# Patient Record
Sex: Male | Born: 1945 | Race: Black or African American | Hispanic: No | Marital: Single | Smoking: Former smoker
Health system: Southern US, Community
[De-identification: ages and names within clinical notes are randomized; demographics above are authoritative.]

## PROBLEM LIST (undated history)

## (undated) DIAGNOSIS — C61 Malignant neoplasm of prostate: Secondary | ICD-10-CM

## (undated) DIAGNOSIS — T7840XA Allergy, unspecified, initial encounter: Secondary | ICD-10-CM

## (undated) DIAGNOSIS — M1990 Unspecified osteoarthritis, unspecified site: Secondary | ICD-10-CM

## (undated) HISTORY — DX: Allergy, unspecified, initial encounter: T78.40XA

## (undated) HISTORY — PX: PROSTATE SURGERY: SHX751

## (undated) HISTORY — PX: EYE SURGERY: SHX253

## (undated) HISTORY — DX: Unspecified osteoarthritis, unspecified site: M19.90

## (undated) HISTORY — PX: COLONOSCOPY: SHX174

---

## 2013-04-04 ENCOUNTER — Encounter (HOSPITAL_COMMUNITY): Payer: Self-pay

## 2013-04-04 ENCOUNTER — Emergency Department (HOSPITAL_COMMUNITY)
Admission: EM | Admit: 2013-04-04 | Discharge: 2013-04-04 | Disposition: A | Discharge status: Home / Self Care | Payer: Medicare Other | Attending: Emergency Medicine | Admitting: Emergency Medicine

## 2013-04-04 ENCOUNTER — Emergency Department (HOSPITAL_COMMUNITY): Payer: Medicare Other

## 2013-04-04 DIAGNOSIS — R059 Cough, unspecified: Secondary | ICD-10-CM | POA: Insufficient documentation

## 2013-04-04 DIAGNOSIS — R05 Cough: Secondary | ICD-10-CM | POA: Insufficient documentation

## 2013-04-04 DIAGNOSIS — J189 Pneumonia, unspecified organism: Secondary | ICD-10-CM | POA: Insufficient documentation

## 2013-04-04 DIAGNOSIS — Z87891 Personal history of nicotine dependence: Secondary | ICD-10-CM | POA: Insufficient documentation

## 2013-04-04 DIAGNOSIS — H109 Unspecified conjunctivitis: Secondary | ICD-10-CM | POA: Insufficient documentation

## 2013-04-04 HISTORY — DX: Malignant neoplasm of prostate: C61

## 2013-04-04 MED ORDER — SULFACETAMIDE SODIUM 10 % OP SOLN
2.0000 [drp] | OPHTHALMIC | Status: DC
  Filled 2013-04-04: qty 15

## 2013-04-04 MED ORDER — AZITHROMYCIN 250 MG PO TABS: ORAL_TABLET | ORAL | Status: DC

## 2013-04-04 NOTE — ED Provider Notes (Signed)
  CSN: [id number]     Arrival date & time 04/04/13  2952 History     First MD Initiated Contact with Patient 04/04/13 [phone]     Chief Complaint  Patient presents with  . Eye Drainage  . Cough   (Consider location/radiation/quality/duration/timing/severity/associated sxs/prior Treatment) Patient is a 68 y.o. male presenting with cough.  Cough  Pt reports cough for the last 4-5 days productive of green sputum, not associated with SOB or CP. No fever. Yesterday he noticed some discomfort and redness in his L eye with drainage of mucous today. He has been taking OTC cold remedy with minimal improvement. He is otherwise healthy.    Past Medical History  Diagnosis Date  . Prostate cancer    Past Surgical History  Procedure Laterality Date  . Eye surgery    . Prostate surgery     History reviewed. No pertinent family history. History  Substance Use Topics  . Smoking status: Former Games developer  . Smokeless tobacco: Never Used  . Alcohol Use: No    Review of Systems  Respiratory: Positive for cough.    All other systems reviewed and are negative except as noted in HPI.   Allergies  Review of patient's allergies indicates not on file.  Home Medications  No current outpatient prescriptions on file. BP 119/76  Pulse 67  Temp(Src) 98.1 F (36.7 C) (Oral)  Resp 20  Ht 6\' 2"  (1.88 m)  Wt 233 lb 4 oz (105.802 kg)  BMI 29.93 kg/m2  SpO2 97% Physical Exam  Nursing note and vitals reviewed. Constitutional: He is oriented to person, place, and time. He appears well-developed and well-nourished.  HENT:  Head: Normocephalic and atraumatic.  Eyes: EOM are normal. Pupils are equal, round, and reactive to light. Right eye exhibits no discharge. Left eye exhibits discharge.  Moderate conjunctival injection L eye, mild proptosis bilaterally is normal per patient. No periorbital swelling  Neck: Normal range of motion. Neck supple.  Cardiovascular: Normal rate, normal heart sounds and intact  distal pulses.   Pulmonary/Chest: Effort normal and breath sounds normal.  Abdominal: Bowel sounds are normal. He exhibits no distension. There is no tenderness.  Musculoskeletal: Normal range of motion. He exhibits no edema and no tenderness.  Neurological: He is alert and oriented to person, place, and time. He has normal strength. No cranial nerve deficit or sensory deficit.  Skin: Skin is warm and dry. No rash noted.  Psychiatric: He has a normal mood and affect.    ED Course   Procedures (including critical care time)  Labs Reviewed - No data to display Dg Chest 2 View  04/04/2013   *RADIOLOGY REPORT*  Clinical Data: Productive cough of 304 days duration.  CHEST - 2 VIEW  Comparison: None.  Findings: Heart size is normal.  Mediastinal shadows are normal.  I think there is mild patchy density at both lung bases consistent with minimal basilar pneumonia.  No dense consolidation or major collapse.  No effusions.  No bony abnormalities.  IMPRESSION: Suspicion of mild patchy basilar pneumonia bilaterally.   Original Report Authenticated By: Paulina Fusi, M.D.   1. Community acquired pneumonia   2. Conjunctivitis     MDM  CXR images reviewed. Will give Z-pak for early PNA. Erythro drops for conjunctivitis.   Latorya Bautch B. Bernette Mayers, MD 04/04/13 [phone]

## 2013-04-04 NOTE — ED Notes (Signed)
Patient reports a productive cough with green sputum and has white eye drainage to the left eye with slight swelling x 5 days

## 2014-07-03 ENCOUNTER — Encounter (HOSPITAL_COMMUNITY): Payer: Self-pay | Admitting: Emergency Medicine

## 2014-07-03 ENCOUNTER — Emergency Department (HOSPITAL_COMMUNITY)
Admission: EM | Admit: 2014-07-03 | Discharge: 2014-07-03 | Disposition: A | Discharge status: Home / Self Care | Payer: Medicare Other | Attending: Emergency Medicine | Admitting: Emergency Medicine

## 2014-07-03 ENCOUNTER — Emergency Department (HOSPITAL_COMMUNITY): Payer: Medicare Other

## 2014-07-03 DIAGNOSIS — Z8546 Personal history of malignant neoplasm of prostate: Secondary | ICD-10-CM | POA: Diagnosis not present

## 2014-07-03 DIAGNOSIS — Z79899 Other long term (current) drug therapy: Secondary | ICD-10-CM | POA: Insufficient documentation

## 2014-07-03 DIAGNOSIS — J189 Pneumonia, unspecified organism: Secondary | ICD-10-CM

## 2014-07-03 DIAGNOSIS — Z87891 Personal history of nicotine dependence: Secondary | ICD-10-CM | POA: Diagnosis not present

## 2014-07-03 DIAGNOSIS — J159 Unspecified bacterial pneumonia: Secondary | ICD-10-CM | POA: Insufficient documentation

## 2014-07-03 DIAGNOSIS — R05 Cough: Secondary | ICD-10-CM | POA: Diagnosis not present

## 2014-07-03 DIAGNOSIS — R059 Cough, unspecified: Secondary | ICD-10-CM

## 2014-07-03 MED ORDER — AZITHROMYCIN 250 MG PO TABS: ORAL_TABLET | ORAL | Status: DC

## 2014-07-03 NOTE — Discharge Instructions (Signed)
Take zpack as prescribed.   Stay hydrated.   You have scarring in your right lung. Get repeat xray in a month to make sure it resolves. If it doesn't you may need further evaluation.   Return to ER if you have fever, worse cough, vomiting, trouble breathing

## 2014-07-03 NOTE — ED Notes (Signed)
Pt ambulatory upon DC. He reports that he is leaving with ALL belongings he arrived with.

## 2014-07-03 NOTE — ED Notes (Signed)
Pt states he has had productive cough x 2 days.  States he feels much better and wasn't going to come but decided "he better get checked out".

## 2014-07-03 NOTE — ED Notes (Signed)
MD Yao at bedside 

## 2014-07-03 NOTE — ED Provider Notes (Signed)
CSN: [id number]     Arrival date & time 07/03/14  5643 History   First MD Initiated Contact with Patient 07/03/14 [phone]     Chief Complaint  Patient presents with  . Cough     (Consider location/radiation/quality/duration/timing/severity/associated sxs/prior Treatment) The history is provided by the patient.  Benjamin [name] is a 68 y.o. male hx of prostate cancer in remission here with cough. Has been having productive cough with yellow sputum for the last 2 days. Denies any fevers or chills. Denies any sick contacts. Denies any abdominal pain or vomiting. Patient was a former smoker and quit years ago. Patient had similar presentation a year ago and was diagnosed with pneumonia.    Past Medical History  Diagnosis Date  . Prostate cancer    Past Surgical History  Procedure Laterality Date  . Eye surgery    . Prostate surgery     No family history on file. History  Substance Use Topics  . Smoking status: Former Research scientist (life sciences)  . Smokeless tobacco: Never Used  . Alcohol Use: No    Review of Systems  Respiratory: Positive for cough.   All other systems reviewed and are negative.     Allergies  Review of patient's allergies indicates no known allergies.  Home Medications   Prior to Admission medications   Medication Sig Start Date End Date Taking? Authorizing Provider  b complex vitamins tablet Take 1 tablet by mouth daily.   Yes Historical Provider, MD  Chlorophyll 100 MG TABS Take 1,600 mg by mouth every Monday, Wednesday, and Friday. Patient takes eight 200 mg tablets a day.   Yes Historical Provider, MD  cholecalciferol (VITAMIN D) 1000 UNITS tablet Take 1,000 Units by mouth daily.   Yes Historical Provider, MD  Clove POWD Take 1 scoop by mouth every Monday, Wednesday, and Friday. Puts in tea   Yes Historical Provider, MD  ferrous sulfate 325 (65 FE) MG tablet Take 325 mg by mouth daily with breakfast.   Yes Historical Provider, MD  fish oil-omega-3 fatty acids 1000 MG  capsule Take 1 g by mouth daily.   Yes Historical Provider, MD  Flaxseed, Linseed, (FLAX SEEDS) POWD Take 1 scoop by mouth every Monday, Wednesday, and Friday. Mixes with green tea   Yes Historical Provider, MD  Ginger Root POWD Take 1 scoop by mouth every Monday, Wednesday, and Friday. Mixes in tea   Yes Historical Provider, MD  influenza vac recombinant HA trivalent (FLUBLOK) injection Inject 0.5 mLs into the muscle once.   Yes Historical Provider, MD  magnesium oxide (MAG-OX) 400 MG tablet Take 400 mg by mouth daily.   Yes Historical Provider, MD  Turmeric, Lear Ng, (TURMERIC ROOT) POWD Take 1 scoop by mouth every Monday, Wednesday, and Friday.   Yes Historical Provider, MD  vitamin C (ASCORBIC ACID) 500 MG tablet Take 500 mg by mouth daily.   Yes Historical Provider, MD  Zinc 40 MG TABS Take 40 mg by mouth See admin instructions. Every 4 days   Yes Historical Provider, MD  azithromycin (ZITHROMAX) 250 MG tablet Use as directed 04/04/13   Charles B. Karle Starch, MD   BP 112/60 mmHg  Pulse 82  Temp(Src) 97.8 F (36.6 C) (Oral)  Resp 16  SpO2 100% Physical Exam  Constitutional: He is oriented to person, place, and time. He appears well-developed and well-nourished.  NAD, comfortable   HENT:  Head: Normocephalic.  Mouth/Throat: Oropharynx is clear and moist.  Eyes: Conjunctivae and EOM are normal. Pupils are equal,  round, and reactive to light.  Neck: Normal range of motion. Neck supple.  Cardiovascular: Normal rate, regular rhythm and normal heart sounds.   Pulmonary/Chest: Effort normal and breath sounds normal. No respiratory distress. He has no wheezes. He has no rales.  Abdominal: Soft. Bowel sounds are normal. He exhibits no distension. There is no tenderness. There is no rebound and no guarding.  Musculoskeletal: Normal range of motion. He exhibits no edema or tenderness.  Neurological: He is alert and oriented to person, place, and time. No cranial nerve deficit. Coordination  normal.  Skin: Skin is warm and dry.  Psychiatric: He has a normal mood and affect. His behavior is normal. Judgment and thought content normal.  Nursing note and vitals reviewed.   ED Course  Procedures (including critical care time) Labs Review Labs Reviewed - No data to display  Imaging Review Dg Chest 2 View  07/03/2014   CLINICAL DATA:  Productive cough for 2 days. History of prostate cancer.  EXAM: CHEST  2 VIEW  COMPARISON:  04/04/2013  FINDINGS: Low lung volumes and bibasilar linear opacities not significantly changed from the 04/04/2013 exam. Cardiac and mediastinal margins appear normal. Prominence of stool in the splenic and hepatic flexures.  IMPRESSION: 1. Volume loss and bandlike opacities in the lung bases are very similar to the prior exam from last year, and raise suspicion for scarring given the similar configuration. Recurrent atelectasis or aspiration pneumonia considered possible but less likely. 2. Prominence of stool in the visualized colon may reflect constipation. 3. Low lung volumes.   Electronically Signed   By: Sherryl Barters M.D.   On: 07/03/2014 08:55     EKG Interpretation None      MDM   Final diagnoses:  Cough   Benjamin [name] is a 68 y.o. male here with cough. Likely viral syndrome vs early pneumonia. Appears well, O2 100% on RA. Doesn't appear septic. Will get CXR.   9:18 AM CXR showed recurrent opacity. Consider atelectasis vs pneumonia. Appears well. I think likely atypical organisms. Will give zpack. Will request repeat cxr in a month. He was previous smoker so I am concerned for possible mass underlying the area. If its from pneumonia, the repeat cxr should be more clear. If not, may need further workup.   Wandra Arthurs, MD 07/03/14 [phone]

## 2015-03-20 ENCOUNTER — Other Ambulatory Visit (HOSPITAL_COMMUNITY): Payer: Self-pay

## 2015-03-20 ENCOUNTER — Ambulatory Visit (HOSPITAL_COMMUNITY)
Admission: RE | Admit: 2015-03-20 | Discharge: 2015-03-20 | Disposition: A | Discharge status: Home / Self Care | Payer: Medicare Other | Source: Ambulatory Visit | Attending: Internal Medicine | Admitting: Internal Medicine

## 2015-03-20 ENCOUNTER — Other Ambulatory Visit (HOSPITAL_COMMUNITY): Payer: Self-pay | Admitting: Internal Medicine

## 2015-03-20 DIAGNOSIS — R918 Other nonspecific abnormal finding of lung field: Secondary | ICD-10-CM | POA: Diagnosis present

## 2015-03-20 DIAGNOSIS — J9811 Atelectasis: Secondary | ICD-10-CM

## 2015-03-20 DIAGNOSIS — J984 Other disorders of lung: Secondary | ICD-10-CM | POA: Diagnosis not present

## 2015-06-12 ENCOUNTER — Encounter (HOSPITAL_COMMUNITY): Payer: Self-pay | Admitting: Emergency Medicine

## 2015-06-12 ENCOUNTER — Emergency Department (HOSPITAL_COMMUNITY)
Admission: EM | Admit: 2015-06-12 | Discharge: 2015-06-12 | Disposition: A | Discharge status: Home / Self Care | Payer: Medicare Other | Attending: Emergency Medicine | Admitting: Emergency Medicine

## 2015-06-12 DIAGNOSIS — Z79899 Other long term (current) drug therapy: Secondary | ICD-10-CM | POA: Diagnosis not present

## 2015-06-12 DIAGNOSIS — J029 Acute pharyngitis, unspecified: Secondary | ICD-10-CM | POA: Diagnosis not present

## 2015-06-12 DIAGNOSIS — Z8546 Personal history of malignant neoplasm of prostate: Secondary | ICD-10-CM | POA: Insufficient documentation

## 2015-06-12 DIAGNOSIS — Z87891 Personal history of nicotine dependence: Secondary | ICD-10-CM | POA: Insufficient documentation

## 2015-06-12 MED ORDER — LIDOCAINE VISCOUS 2 % MT SOLN
15.0000 mL | Freq: Once | OROMUCOSAL | Status: AC
  Administered 2015-06-12: 15 mL via OROMUCOSAL
  Filled 2015-06-12: qty 15

## 2015-06-12 MED ORDER — DEXAMETHASONE 4 MG PO TABS
16.0000 mg | ORAL_TABLET | Freq: Once | ORAL | Status: AC
  Administered 2015-06-12: 16 mg via ORAL
  Filled 2015-06-12: qty 4

## 2015-06-12 MED ORDER — LIDOCAINE VISCOUS 2 % MT SOLN: 15.0000 mL | Freq: Three times a day (TID) | OROMUCOSAL | Status: DC

## 2015-06-12 MED ORDER — PENICILLIN G BENZATHINE 1200000 UNIT/2ML IM SUSP
1.2000 10*6.[IU] | Freq: Once | INTRAMUSCULAR | Status: AC
  Administered 2015-06-12: 1.2 10*6.[IU] via INTRAMUSCULAR
  Filled 2015-06-12: qty 2

## 2015-06-12 NOTE — ED Notes (Signed)
MD at bedside. 

## 2015-06-12 NOTE — ED Notes (Signed)
Per pt, states sore throat since yesterday

## 2015-06-12 NOTE — ED Provider Notes (Signed)
CSN: [id number]     Arrival date & time 06/12/15  0720 History   First MD Initiated Contact with Patient 06/12/15 0725     Chief Complaint  Patient presents with  . Sore Throat     (Consider location/radiation/quality/duration/timing/severity/associated sxs/prior Treatment) Patient is a 69 y.o. male presenting with pharyngitis.  Sore Throat This is a new problem. The current episode started 2 days ago. The problem occurs constantly. The problem has been gradually worsening. Pertinent negatives include no chest pain, no abdominal pain, no headaches and no shortness of breath. Nothing aggravates the symptoms. Nothing relieves the symptoms. He has tried nothing for the symptoms.    Past Medical History  Diagnosis Date  . Prostate cancer [facility])    Past Surgical History  Procedure Laterality Date  . Eye surgery    . Prostate surgery     No family history on file. Social History  Substance Use Topics  . Smoking status: Former Research scientist (life sciences)  . Smokeless tobacco: Never Used  . Alcohol Use: No    Review of Systems  Constitutional: Positive for chills. Negative for fever and fatigue.  HENT: Negative for congestion, drooling, rhinorrhea, sneezing, trouble swallowing and voice change.   Respiratory: Negative for cough and shortness of breath.   Cardiovascular: Negative for chest pain.  Gastrointestinal: Negative for nausea, vomiting and abdominal pain.  Endocrine: Negative for polydipsia and polyuria.  Genitourinary: Negative for dysuria.  Neurological: Negative for headaches.      Allergies  Review of patient's allergies indicates no known allergies.  Home Medications   Prior to Admission medications   Medication Sig Start Date End Date Taking? Authorizing Provider  azithromycin (ZITHROMAX Z-PAK) 250 MG tablet 2 po day one, then 1 daily x 4 days 07/03/14   Wandra Arthurs, MD  b complex vitamins tablet Take 1 tablet by mouth daily.    Historical Provider, MD  Chlorophyll 100 MG TABS  Take 1,600 mg by mouth every Monday, Wednesday, and Friday. Patient takes eight 200 mg tablets a day.    Historical Provider, MD  cholecalciferol (VITAMIN D) 1000 UNITS tablet Take 1,000 Units by mouth daily.    Historical Provider, MD  Oscar La POWD Take 1 scoop by mouth every Monday, Wednesday, and Friday. Puts in tea    Historical Provider, MD  ferrous sulfate 325 (65 FE) MG tablet Take 325 mg by mouth daily with breakfast.    Historical Provider, MD  fish oil-omega-3 fatty acids 1000 MG capsule Take 1 g by mouth daily.    Historical Provider, MD  Flaxseed, Linseed, (FLAX SEEDS) POWD Take 1 scoop by mouth every Monday, Wednesday, and Friday. Mixes with green tea    Historical Provider, MD  Ginger Root POWD Take 1 scoop by mouth every Monday, Wednesday, and Friday. Mixes in tea    Historical Provider, MD  influenza vac recombinant HA trivalent (FLUBLOK) injection Inject 0.5 mLs into the muscle once.    Historical Provider, MD  magnesium oxide (MAG-OX) 400 MG tablet Take 400 mg by mouth daily.    Historical Provider, MD  Turmeric, Lear Ng, (TURMERIC ROOT) POWD Take 1 scoop by mouth every Monday, Wednesday, and Friday.    Historical Provider, MD  vitamin C (ASCORBIC ACID) 500 MG tablet Take 500 mg by mouth daily.    Historical Provider, MD  Zinc 40 MG TABS Take 40 mg by mouth See admin instructions. Every 4 days    Historical Provider, MD   BP 142/70 mmHg  Pulse 76  Temp(Src) 98.7 F (37.1 C)  Resp 18  SpO2 100% Physical Exam  Constitutional: He is oriented to person, place, and time. He appears well-developed and well-nourished.  HENT:  Head: Normocephalic and atraumatic.  Mouth/Throat: Oropharyngeal exudate, posterior oropharyngeal edema and posterior oropharyngeal erythema present. No tonsillar abscesses.  Eyes: Conjunctivae and EOM are normal.  Neck: Normal range of motion. Neck supple.  Cardiovascular: Normal rate and regular rhythm.   Pulmonary/Chest: Effort normal. No respiratory  distress.  Abdominal: Soft. There is no tenderness.  Musculoskeletal: Normal range of motion. He exhibits no edema or tenderness.  Neurological: He is alert and oriented to person, place, and time.  Skin: Skin is warm and dry.  Nursing note and vitals reviewed.   ED Course  Procedures (including critical care time) Labs Review Labs Reviewed - No data to display  Imaging Review No results found. I have personally reviewed and evaluated these images and lab results as part of my medical decision-making.   EKG Interpretation None      MDM   Final diagnoses:  Pharyngitis   Patient with pharyngitis without evidence of abscess or other compilations. Centor score of 3 I'll elect to treat prophylactically for strep. Patient without any voice changes, trouble swallowing or breathing however will return if he starts having the symptoms.  I have personally and contemperaneously reviewed labs and imaging and used in my decision making as above.   A medical screening exam was performed and I feel the patient has had an appropriate workup for their chief complaint at this time and likelihood of emergent condition existing is low. They have been counseled on decision, discharge, follow up and which symptoms necessitate immediate return to the emergency department. They or their family verbally stated understanding and agreement with plan and discharged in stable condition.      Merrily Pew, MD 06/12/15 [phone]

## 2015-11-11 ENCOUNTER — Encounter: Payer: Self-pay | Admitting: Gastroenterology

## 2015-12-17 ENCOUNTER — Ambulatory Visit (AMBULATORY_SURGERY_CENTER): Payer: Self-pay

## 2015-12-17 VITALS — Ht 72.0 in | Wt 258.6 lb

## 2015-12-17 DIAGNOSIS — Z1211 Encounter for screening for malignant neoplasm of colon: Secondary | ICD-10-CM

## 2015-12-17 MED ORDER — SUPREP BOWEL PREP KIT 17.5-3.13-1.6 GM/177ML PO SOLN
1.0000 | Freq: Once | ORAL | Status: DC
Start: 2015-12-17 — End: 2015-12-31

## 2015-12-17 NOTE — Progress Notes (Signed)
No allergies to eggs or soy No past problems with anesthesia No home oxygen No diet meds  Has email and internet; refused emmi 

## 2015-12-31 ENCOUNTER — Ambulatory Visit (AMBULATORY_SURGERY_CENTER): Payer: Medicare Other | Admitting: Gastroenterology

## 2015-12-31 ENCOUNTER — Encounter: Payer: Self-pay | Admitting: Gastroenterology

## 2015-12-31 VITALS — BP 137/87 | HR 61 | Temp 98.0°F | Resp 12 | Ht 72.0 in | Wt 258.0 lb

## 2015-12-31 DIAGNOSIS — Z1211 Encounter for screening for malignant neoplasm of colon: Secondary | ICD-10-CM | POA: Diagnosis not present

## 2015-12-31 DIAGNOSIS — D123 Benign neoplasm of transverse colon: Secondary | ICD-10-CM | POA: Diagnosis not present

## 2015-12-31 DIAGNOSIS — D120 Benign neoplasm of cecum: Secondary | ICD-10-CM | POA: Diagnosis not present

## 2015-12-31 MED ORDER — SODIUM CHLORIDE 0.9 % IV SOLN: 500.0000 mL | INTRAVENOUS | Status: DC

## 2015-12-31 NOTE — Patient Instructions (Signed)

## 2015-12-31 NOTE — Progress Notes (Signed)
Called to room to assist during endoscopic procedure.  Patient ID and intended procedure confirmed with present staff. Received instructions for my participation in the procedure from the performing physician.  

## 2015-12-31 NOTE — Op Note (Signed)
[facility] Patient Name: Benjamin [name] Procedure Date: 12/31/2015 10:52 AM MRN: [id number] Endoscopist: Seama. Loletha Carrow , MD Age: 70 Date of Birth: [birth year] Gender: Male Procedure:                Colonoscopy Indications:              Screening for colorectal malignant neoplasm Medicines:                Monitored Anesthesia Care Procedure:                Pre-Anesthesia Assessment:                           - Prior to the procedure, a History and Physical                            was performed, and patient medications and                            allergies were reviewed. The patient's tolerance of                            previous anesthesia was also reviewed. The risks                            and benefits of the procedure and the sedation                            options and risks were discussed with the patient.                            All questions were answered, and informed consent                            was obtained. Prior Anticoagulants: The patient has                            taken no previous anticoagulant or antiplatelet                            agents. ASA Grade Assessment: II - A patient with                            mild systemic disease. After reviewing the risks                            and benefits, the patient was deemed in                            satisfactory condition to undergo the procedure.                           After obtaining informed consent, the colonoscope  was passed under direct vision. Throughout the                            procedure, the patient's blood pressure, pulse, and                            oxygen saturations were monitored continuously. The                            Model CF-HQ190L [id number]) scope was introduced                            through the anus and advanced to the the cecum,                            identified by appendiceal orifice and ileocecal                      valve. The colonoscopy was performed without                            difficulty. The patient tolerated the procedure                            well. The quality of the bowel preparation was                            excellent. The ileocecal valve, appendiceal                            orifice, and rectum were photographed. Scope In: 11:13:43 AM Scope Out: 11:32:08 AM Scope Withdrawal Time: 0 hours 12 minutes 55 seconds  Total Procedure Duration: 0 hours 18 minutes 25 seconds  Findings:                 The perianal and digital rectal examinations were                            normal.                           Four sessile polyps were found in the proximal                            transverse colon, distal transverse colon, cecum                            and ileocecal valve. The polyps were 4 to 6 mm in                            size. These polyps were removed with a cold snare.                            Resection and retrieval were complete.  Internal hemorrhoids were found during                            retroflexion. The hemorrhoids were medium-sized and                            Grade I (internal hemorrhoids that do not prolapse).                           The exam was otherwise without abnormality. Complications:            No immediate complications. Estimated Blood Loss:     Estimated blood loss: none. Impression:               - Four 4 to 6 mm polyps in the proximal transverse                            colon, in the distal transverse colon, in the cecum                            and at the ileocecal valve, removed with a cold                            snare. Resected and retrieved.                           - Internal hemorrhoids.                           - The examination was otherwise normal. Recommendation:           - Patient has a contact number available for                            emergencies. The signs and  symptoms of potential                            delayed complications were discussed with the                            patient. Return to normal activities tomorrow.                            Written discharge instructions were provided to the                            patient.                           - Resume previous diet.                           - No aspirin, ibuprofen, naproxen, or other                            non-steroidal anti-inflammatory drugs for 5 days  after polyp removal.                           - Await pathology results.                           - Repeat colonoscopy is recommended for                            surveillance. The colonoscopy date will be                            determined after pathology results from today's                            exam become available for review. Pavan Bring L. Loletha Carrow, MD 12/31/2015 11:39:54 AM This report has been signed electronically.

## 2015-12-31 NOTE — Progress Notes (Signed)
Report to PACU, RN, vss, BBS= Clear.  

## 2016-01-01 ENCOUNTER — Telehealth: Payer: Self-pay

## 2016-01-01 NOTE — Telephone Encounter (Signed)
Attempt follow up call post procedure. No answer or vm to leave message.

## 2016-01-05 ENCOUNTER — Encounter: Payer: Self-pay | Admitting: Gastroenterology

## 2016-06-23 ENCOUNTER — Emergency Department (HOSPITAL_COMMUNITY)
Admission: EM | Admit: 2016-06-23 | Discharge: 2016-06-23 | Disposition: A | Discharge status: Home / Self Care | Payer: Medicare Other | Attending: Emergency Medicine | Admitting: Emergency Medicine

## 2016-06-23 ENCOUNTER — Encounter (HOSPITAL_COMMUNITY): Payer: Self-pay | Admitting: Emergency Medicine

## 2016-06-23 DIAGNOSIS — R0981 Nasal congestion: Secondary | ICD-10-CM | POA: Insufficient documentation

## 2016-06-23 DIAGNOSIS — Z8546 Personal history of malignant neoplasm of prostate: Secondary | ICD-10-CM | POA: Diagnosis not present

## 2016-06-23 DIAGNOSIS — Z87891 Personal history of nicotine dependence: Secondary | ICD-10-CM | POA: Insufficient documentation

## 2016-06-23 DIAGNOSIS — Z7982 Long term (current) use of aspirin: Secondary | ICD-10-CM | POA: Insufficient documentation

## 2016-06-23 DIAGNOSIS — J3489 Other specified disorders of nose and nasal sinuses: Secondary | ICD-10-CM | POA: Diagnosis not present

## 2016-06-23 MED ORDER — CETIRIZINE HCL 10 MG PO TABS: 10.0000 mg | ORAL_TABLET | Freq: Every day | ORAL | 0 refills | Status: DC

## 2016-06-23 MED ORDER — FLUTICASONE PROPIONATE 50 MCG/ACT NA SUSP: NASAL | 1 refills | Status: DC

## 2016-06-23 MED ORDER — TAMSULOSIN HCL 0.4 MG PO CAPS: 0.4000 mg | ORAL_CAPSULE | Freq: Every day | ORAL | 0 refills | Status: DC

## 2016-06-23 NOTE — Discharge Instructions (Signed)
You have seasonal allergies.  No sign of infection requiring antibiotics.

## 2016-06-23 NOTE — ED Triage Notes (Signed)
Patient complaining of nasal congestion and non-productive cough starting Saturday. Denies pain. Patient speaking in full sentences.

## 2016-06-23 NOTE — ED Provider Notes (Signed)
[facility] DEPT Provider Note   CSN: [id number] Arrival date & time: 06/23/16  1257     History   Chief Complaint Chief Complaint  Patient presents with  . Nasal Congestion  . Cough    HPI Benjamin [name] is a 70 y.o. male.He presents stating he's had running out of the left side of his nose. Congested for the last 2 days. He states once or twice here this happens. He states that except difficult for him to sleep. No sinus pain. No pressure. No fever cough or shortness of breath.  HPI  Past Medical History:  Diagnosis Date  . Prostate cancer ([place])     There are no active problems to display for this patient.   Past Surgical History:  Procedure Laterality Date  . EYE SURGERY    . PROSTATE SURGERY         Home Medications    Prior to Admission medications   Medication Sig Start Date End Date Taking? Authorizing Provider  aspirin EC 81 MG tablet Take 81 mg by mouth daily.    Historical Provider, MD  b complex vitamins tablet Take 1 tablet by mouth daily.    Historical Provider, MD  cetirizine (ZYRTEC) 10 MG tablet Take 1 tablet (10 mg total) by mouth daily. 1 po q day prn allergies 06/23/16   Tanna Furry, MD  cetirizine (ZYRTEC) 10 MG tablet Take 1 tablet (10 mg total) by mouth daily. 1 po q day prn allergies 06/23/16   Tanna Furry, MD  cholecalciferol (VITAMIN D) 1000 UNITS tablet Take 1,000 Units by mouth daily.    Historical Provider, MD  Flaxseed, Linseed, (FLAX SEEDS) POWD Take 1 scoop by mouth every Monday, Wednesday, and Friday. Mixes with green tea    Historical Provider, MD  fluticasone (FLONASE) 50 MCG/ACT nasal spray 1 spray each nares bid 06/23/16   Tanna Furry, MD  Ginger Root POWD Take 1 scoop by mouth every Monday, Wednesday, and Friday. Mixes in tea    Historical Provider, MD  influenza vac recombinant HA trivalent (FLUBLOK) injection Inject 0.5 mLs into the muscle once. Reported on 12/31/2015    Historical Provider, MD  [place]     Historical Provider, MD  tamsulosin (FLOMAX) 0.4 MG CAPS capsule Take 1 capsule (0.4 mg total) by mouth daily. 06/23/16   Tanna Furry, MD  Turmeric, Lear Ng, (TURMERIC ROOT) POWD Take 1 scoop by mouth every Monday, Wednesday, and Friday.    Historical Provider, MD    Family History No family history on file.  Social History Social History  Substance Use Topics  . Smoking status: Former Research scientist (life sciences)  . Smokeless tobacco: Never Used  . Alcohol use No     Allergies   Review of patient's allergies indicates no known allergies.   Review of Systems Review of Systems  Constitutional: Negative for appetite change, chills, diaphoresis, fatigue and fever.  HENT: Positive for congestion and rhinorrhea. Negative for mouth sores, sore throat and trouble swallowing.   Eyes: Negative for visual disturbance.  Respiratory: Negative for cough, chest tightness, shortness of breath and wheezing.   Cardiovascular: Negative for chest pain.  Gastrointestinal: Negative for abdominal distention, abdominal pain, diarrhea, nausea and vomiting.  Endocrine: Negative for polydipsia, polyphagia and polyuria.  Genitourinary: Negative for dysuria, frequency and hematuria.  Musculoskeletal: Negative for gait problem.  Skin: Negative for color change, pallor and rash.  Neurological: Negative for dizziness, syncope, light-headedness and headaches.  Hematological: Does not bruise/bleed easily.  Psychiatric/Behavioral:  Negative for behavioral problems and confusion.     Physical Exam Updated Vital Signs BP 114/75 (BP Location: Right Arm)   Pulse (!) 58   Temp 97.6 F (36.4 C) (Oral)   Resp 16   Ht 5\' 11"  (1.803 m)   Wt 258 lb (117 kg)   SpO2 100%   BMI 35.98 kg/m   Physical Exam  Constitutional: He is oriented to person, place, and time. He appears well-developed and well-nourished. No distress.  HENT:  Head: Normocephalic.  Eyes: Conjunctivae are normal. Pupils are equal, round, and reactive to  light. No scleral icterus.  Neck: Normal range of motion. Neck supple. No thyromegaly present.  Cardiovascular: Normal rate and regular rhythm.  Exam reveals no gallop and no friction rub.   No murmur heard. Pulmonary/Chest: Effort normal and breath sounds normal. No respiratory distress. He has no wheezes. He has no rales.  Abdominal: Soft. Bowel sounds are normal. He exhibits no distension. There is no tenderness. There is no rebound.  Musculoskeletal: Normal range of motion.  Neurological: He is alert and oriented to person, place, and time.  Skin: Skin is warm and dry. No rash noted.  Psychiatric: He has a normal mood and affect. His behavior is normal.     ED Treatments / Results  Labs (all labs ordered are listed, but only abnormal results are displayed) Labs Reviewed - No data to display  EKG  EKG Interpretation None       Radiology No results found.  Procedures Procedures (including critical care time)  Medications Ordered in ED Medications - No data to display   Initial Impression / Assessment and Plan / ED Course  I have reviewed the triage vital signs and the nursing notes.  Pertinent labs & imaging results that were available during my care of the patient were reviewed by me and considered in my medical decision making (see chart for details).  Clinical Course    Nasal congestion. No sign of suppurative bacterial infection clinically.  Final Clinical Impressions(s) / ED Diagnoses   Final diagnoses:  Nasal congestion    New Prescriptions Discharge Medication List as of 06/23/2016  2:16 PM    START taking these medications   Details  cetirizine (ZYRTEC) 10 MG tablet Take 1 tablet (10 mg total) by mouth daily. 1 po q day prn allergies, Starting Wed 06/23/2016, Print    tamsulosin (FLOMAX) 0.4 MG CAPS capsule Take 1 capsule (0.4 mg total) by mouth daily., Starting Wed 06/23/2016, Print         Tanna Furry, MD 06/23/16 1459

## 2016-09-04 ENCOUNTER — Emergency Department
Admission: EM | Admit: 2016-09-04 | Discharge: 2016-09-04 | Disposition: A | Discharge status: Home / Self Care | Payer: Medicare Other | Attending: Emergency Medicine | Admitting: Emergency Medicine

## 2016-09-04 ENCOUNTER — Emergency Department: Payer: Medicare Other

## 2016-09-04 DIAGNOSIS — Z7982 Long term (current) use of aspirin: Secondary | ICD-10-CM | POA: Diagnosis not present

## 2016-09-04 DIAGNOSIS — Z87891 Personal history of nicotine dependence: Secondary | ICD-10-CM | POA: Diagnosis not present

## 2016-09-04 DIAGNOSIS — J40 Bronchitis, not specified as acute or chronic: Secondary | ICD-10-CM | POA: Insufficient documentation

## 2016-09-04 DIAGNOSIS — R05 Cough: Secondary | ICD-10-CM | POA: Diagnosis present

## 2016-09-04 DIAGNOSIS — Z8546 Personal history of malignant neoplasm of prostate: Secondary | ICD-10-CM | POA: Diagnosis not present

## 2016-09-04 DIAGNOSIS — R059 Cough, unspecified: Secondary | ICD-10-CM

## 2016-09-04 LAB — INFLUENZA PANEL BY PCR (TYPE A & B)
Influenza A By PCR: NEGATIVE
Influenza B By PCR: NEGATIVE

## 2016-09-04 MED ORDER — BENZONATATE 100 MG PO CAPS: 100.0000 mg | ORAL_CAPSULE | Freq: Four times a day (QID) | ORAL | 0 refills | Status: DC | PRN

## 2016-09-04 MED ORDER — ALBUTEROL SULFATE (2.5 MG/3ML) 0.083% IN NEBU
5.0000 mg | INHALATION_SOLUTION | Freq: Once | RESPIRATORY_TRACT | Status: AC
  Administered 2016-09-04: 5 mg via RESPIRATORY_TRACT
  Filled 2016-09-04: qty 6

## 2016-09-04 MED ORDER — PREDNISONE 20 MG PO TABS: 60.0000 mg | ORAL_TABLET | Freq: Every day | ORAL | 0 refills | Status: DC

## 2016-09-04 MED ORDER — PREDNISONE 20 MG PO TABS
60.0000 mg | ORAL_TABLET | Freq: Once | ORAL | Status: AC
  Administered 2016-09-04: 60 mg via ORAL
  Filled 2016-09-04: qty 3

## 2016-09-04 MED ORDER — BENZONATATE 100 MG PO CAPS
100.0000 mg | ORAL_CAPSULE | Freq: Once | ORAL | Status: AC
  Administered 2016-09-04: 100 mg via ORAL
  Filled 2016-09-04: qty 1

## 2016-09-04 NOTE — ED Provider Notes (Signed)
[facility] Emergency Department Provider Note   ____________________________________________   First MD Initiated Contact with Patient 09/04/16 [phone]     (approximate)  I have reviewed the triage vital signs and the nursing notes.   HISTORY  Chief Complaint Cough   HPI Benjamin [name] is a 71 y.o. male who comes into the hospital today with some cough and spitting up. He reports that he has been unable to sleep for the past 3-4 nights because she is been coughing so much. The patient reports that he is spitting up brown/green/white yellow phlegm. The patient has not had any fevers or pains in his chest. He reports that he is here in town making a [place] for someone. He reports that the symptoms started 3-4 days ago which is why he hasn't been able to sleep. He has been taking Mucinex without relief. The patient denies any shortness of breath, nausea, vomiting, chest pain. He reports that every year around this time he gets a Z-Pak for similar symptoms. He is here today for evaluation.   Past Medical History:  Diagnosis Date  . Prostate cancer ([place])     There are no active problems to display for this patient.   Past Surgical History:  Procedure Laterality Date  . EYE SURGERY    . PROSTATE SURGERY      Prior to Admission medications   Medication Sig Start Date End Date Taking? Authorizing Provider  aspirin EC 81 MG tablet Take 81 mg by mouth daily.    Historical Provider, MD  b complex vitamins tablet Take 1 tablet by mouth daily.    Historical Provider, MD  benzonatate (TESSALON PERLES) 100 MG capsule Take 1 capsule (100 mg total) by mouth every 6 (six) hours as needed for cough. 09/04/16   Loney Hering, MD  cetirizine (ZYRTEC) 10 MG tablet Take 1 tablet (10 mg total) by mouth daily. 1 po q day prn allergies 06/23/16   Tanna Furry, MD  cetirizine (ZYRTEC) 10 MG tablet Take 1 tablet (10 mg total) by mouth daily. 1 po q day prn allergies 06/23/16    Tanna Furry, MD  cholecalciferol (VITAMIN D) 1000 UNITS tablet Take 1,000 Units by mouth daily.    Historical Provider, MD  Flaxseed, Linseed, (FLAX SEEDS) POWD Take 1 scoop by mouth every Monday, Wednesday, and Friday. Mixes with green tea    Historical Provider, MD  fluticasone (FLONASE) 50 MCG/ACT nasal spray 1 spray each nares bid 06/23/16   Tanna Furry, MD  Ginger Root POWD Take 1 scoop by mouth every Monday, Wednesday, and Friday. Mixes in tea    Historical Provider, MD  influenza vac recombinant HA trivalent (FLUBLOK) injection Inject 0.5 mLs into the muscle once. Reported on 12/31/2015    Historical Provider, MD  [place]    Historical Provider, MD  predniSONE (DELTASONE) 20 MG tablet Take 3 tablets (60 mg total) by mouth daily. 09/04/16   Loney Hering, MD  tamsulosin (FLOMAX) 0.4 MG CAPS capsule Take 1 capsule (0.4 mg total) by mouth daily. 06/23/16   Tanna Furry, MD  Turmeric, Lear Ng, (TURMERIC ROOT) POWD Take 1 scoop by mouth every Monday, Wednesday, and Friday.    Historical Provider, MD    Allergies Patient has no known allergies.  No family history on file.  Social History Social History  Substance Use Topics  . Smoking status: Former Research scientist (life sciences)  . Smokeless tobacco: Never Used  . Alcohol use No  Review of Systems Constitutional: No fever/chills Eyes: No visual changes. ENT: No sore throat. Cardiovascular: Denies chest pain. Respiratory: Cough Gastrointestinal: No abdominal pain.  No nausea, no vomiting.  No diarrhea.  No constipation. Genitourinary: Negative for dysuria. Musculoskeletal: Negative for back pain. Skin: Negative for rash. Neurological: Negative for headaches, focal weakness or numbness.  10-point ROS otherwise negative.  ____________________________________________   PHYSICAL EXAM:  VITAL SIGNS: ED Triage Vitals  Enc Vitals Group     BP 09/04/16 0121 115/69     Pulse Rate 09/04/16 0121 74     Resp 09/04/16 0121  18     Temp 09/04/16 0121 98.4 F (36.9 C)     Temp Source 09/04/16 0121 Oral     SpO2 09/04/16 0121 96 %     Weight 09/04/16 0120 242 lb 5 oz (109.9 kg)     Height 09/04/16 0120 6\' 1"  (1.854 m)     Head Circumference --      Peak Flow --      Pain Score 09/04/16 0124 0     Pain Loc --      Pain Edu? --      Excl. in [place]? --     Constitutional: Alert and oriented. Well appearing and in Mild distress. Eyes: Conjunctivae are normal. PERRL. EOMI. Head: Atraumatic. Nose: No congestion/rhinnorhea. Mouth/Throat: Mucous membranes are moist.  Oropharynx non-erythematous. Cardiovascular: Normal rate, regular rhythm. Grossly normal heart sounds.  Good peripheral circulation. Respiratory: Normal respiratory effort.  No retractions. Lungs CTAB. Gastrointestinal: Soft and nontender. No distention. Positive bowel sounds Musculoskeletal: No lower extremity tenderness nor edema.   Neurologic:  Normal speech and language.  Skin:  Skin is warm, dry and intact.  Psychiatric: Mood and affect are normal.   ____________________________________________   LABS (all labs ordered are listed, but only abnormal results are displayed)  Labs Reviewed  INFLUENZA PANEL BY PCR (TYPE A & B, H1N1)   ____________________________________________  EKG  none ____________________________________________  RADIOLOGY  CXR ____________________________________________   PROCEDURES  Procedure(s) performed: None  Procedures  Critical Care performed: No  ____________________________________________   INITIAL IMPRESSION / ASSESSMENT AND PLAN / ED COURSE  Pertinent labs & imaging results that were available during my care of the patient were reviewed by me and considered in my medical decision making (see chart for details).  This is a 71 year old male who comes into the hospital today with a cough that is been productive of sputum over the last few days. The patient did receive a chest x-ray which did  not show a pneumonia. He received an albuterol treatment in triage. I will give the patient some benzonatate and some prednisone. He had a flu swab that was negative. The patient will be discharged to home with some steroids as well as some cough medicine. Otherwise patient has no further concerns.  Clinical Course as of Sep 05 647  Sat Sep 04, 2016  0429 Shallow inspiration with linear atelectasis in the lung bases. DG Chest 2 View [AW]    Clinical Course User Index [AW] Loney Hering, MD     ____________________________________________   FINAL CLINICAL IMPRESSION(S) / ED DIAGNOSES  Final diagnoses:  Cough  Bronchitis      NEW MEDICATIONS STARTED DURING THIS VISIT:  Discharge Medication List as of 09/04/2016  5:27 AM    START taking these medications   Details  benzonatate (TESSALON PERLES) 100 MG capsule Take 1 capsule (100 mg total) by mouth every 6 (six) hours as needed  for cough., Starting Sat 09/04/2016, Print    predniSONE (DELTASONE) 20 MG tablet Take 3 tablets (60 mg total) by mouth daily., Starting Sat 09/04/2016, Print         Note:  This document was prepared using Dragon voice recognition software and may include unintentional dictation errors.    Loney Hering, MD 09/04/16 [phone]

## 2016-09-04 NOTE — ED Notes (Signed)
Pt ambulatory to stat desk with EMS, EMS report cough x 3 days, productive, VSS en route, been taking flonase and mucinex w/o relief.  Pt NAD at this time.

## 2016-09-04 NOTE — ED Triage Notes (Signed)
Pt reports congestion and cough for about 3 to 4 days. Pt reports he gets these symptoms about once a year and usually a z-pack helps him.

## 2016-12-01 ENCOUNTER — Encounter (HOSPITAL_COMMUNITY): Payer: Self-pay | Admitting: *Deleted

## 2016-12-01 ENCOUNTER — Emergency Department (HOSPITAL_COMMUNITY): Payer: Medicare Other

## 2016-12-01 ENCOUNTER — Emergency Department (HOSPITAL_COMMUNITY)
Admission: EM | Admit: 2016-12-01 | Discharge: 2016-12-01 | Disposition: A | Discharge status: Home / Self Care | Payer: Medicare Other | Attending: Emergency Medicine | Admitting: Emergency Medicine

## 2016-12-01 DIAGNOSIS — J039 Acute tonsillitis, unspecified: Secondary | ICD-10-CM

## 2016-12-01 DIAGNOSIS — J029 Acute pharyngitis, unspecified: Secondary | ICD-10-CM | POA: Diagnosis present

## 2016-12-01 DIAGNOSIS — Z87891 Personal history of nicotine dependence: Secondary | ICD-10-CM | POA: Diagnosis not present

## 2016-12-01 DIAGNOSIS — Z7982 Long term (current) use of aspirin: Secondary | ICD-10-CM | POA: Diagnosis not present

## 2016-12-01 DIAGNOSIS — Z8546 Personal history of malignant neoplasm of prostate: Secondary | ICD-10-CM | POA: Diagnosis not present

## 2016-12-01 DIAGNOSIS — Z79899 Other long term (current) drug therapy: Secondary | ICD-10-CM | POA: Diagnosis not present

## 2016-12-01 DIAGNOSIS — R791 Abnormal coagulation profile: Secondary | ICD-10-CM | POA: Insufficient documentation

## 2016-12-01 LAB — CBC WITH DIFFERENTIAL/PLATELET
BASOS ABS: 0 10*3/uL (ref 0.0–0.1)
BASOS PCT: 0 %
Eosinophils Absolute: 0.1 10*3/uL (ref 0.0–0.7)
Eosinophils Relative: 1 %
HEMATOCRIT: 40.5 % (ref 39.0–52.0)
Hemoglobin: 13.3 g/dL (ref 13.0–17.0)
LYMPHS PCT: 10 %
Lymphs Abs: 1.5 10*3/uL (ref 0.7–4.0)
MCH: 29.6 pg (ref 26.0–34.0)
MCHC: 32.8 g/dL (ref 30.0–36.0)
MCV: 90 fL (ref 78.0–100.0)
MONO ABS: 1.8 10*3/uL — AB (ref 0.1–1.0)
Monocytes Relative: 12 %
NEUTROS ABS: 11.1 10*3/uL — AB (ref 1.7–7.7)
NEUTROS PCT: 77 %
Platelets: 194 10*3/uL (ref 150–400)
RBC: 4.5 MIL/uL (ref 4.22–5.81)
RDW: 13.3 % (ref 11.5–15.5)
WBC: 14.4 10*3/uL — AB (ref 4.0–10.5)

## 2016-12-01 LAB — PROTIME-INR
INR: 1.11
Prothrombin Time: 14.3 seconds (ref 11.4–15.2)

## 2016-12-01 LAB — COMPREHENSIVE METABOLIC PANEL
ALBUMIN: 3.8 g/dL (ref 3.5–5.0)
ALK PHOS: 48 U/L (ref 38–126)
ALT: 27 U/L (ref 17–63)
AST: 42 U/L — AB (ref 15–41)
Anion gap: 6 (ref 5–15)
BILIRUBIN TOTAL: 0.8 mg/dL (ref 0.3–1.2)
BUN: 18 mg/dL (ref 6–20)
CALCIUM: 8.8 mg/dL — AB (ref 8.9–10.3)
CO2: 30 mmol/L (ref 22–32)
Chloride: 101 mmol/L (ref 101–111)
Creatinine, Ser: 1.16 mg/dL (ref 0.61–1.24)
GFR calc Af Amer: >60 mL/min (ref >60)
GFR calc non Af Amer: >60 mL/min (ref >60)
GLUCOSE: 101 mg/dL — AB (ref 65–99)
POTASSIUM: 3.7 mmol/L (ref 3.5–5.1)
SODIUM: 137 mmol/L (ref 135–145)
TOTAL PROTEIN: 7.9 g/dL (ref 6.5–8.1)

## 2016-12-01 LAB — RAPID STREP SCREEN (MED CTR MEBANE ONLY): STREPTOCOCCUS, GROUP A SCREEN (DIRECT): NEGATIVE

## 2016-12-01 LAB — I-STAT CG4 LACTIC ACID, ED: Lactic Acid, Venous: 1.03 mmol/L (ref 0.5–1.9)

## 2016-12-01 MED ORDER — IOPAMIDOL (ISOVUE-300) INJECTION 61%
INTRAVENOUS | Status: AC
  Filled 2016-12-01: qty 75

## 2016-12-01 MED ORDER — CLINDAMYCIN HCL 150 MG PO CAPS: 450.0000 mg | ORAL_CAPSULE | Freq: Three times a day (TID) | ORAL | 0 refills | Status: AC

## 2016-12-01 MED ORDER — MORPHINE SULFATE (PF) 2 MG/ML IV SOLN
4.0000 mg | Freq: Once | INTRAVENOUS | Status: AC
  Administered 2016-12-01: 4 mg via INTRAVENOUS
  Filled 2016-12-01: qty 2

## 2016-12-01 MED ORDER — SODIUM CHLORIDE 0.9 % IV BOLUS (SEPSIS)
1000.0000 mL | Freq: Once | INTRAVENOUS | Status: AC
  Administered 2016-12-01: 1000 mL via INTRAVENOUS

## 2016-12-01 MED ORDER — CLINDAMYCIN HCL 300 MG PO CAPS
450.0000 mg | ORAL_CAPSULE | Freq: Once | ORAL | Status: AC
  Administered 2016-12-01: 450 mg via ORAL
  Filled 2016-12-01: qty 1

## 2016-12-01 MED ORDER — IOPAMIDOL (ISOVUE-300) INJECTION 61%
75.0000 mL | Freq: Once | INTRAVENOUS | Status: AC | PRN
  Administered 2016-12-01: 75 mL via INTRAVENOUS

## 2016-12-01 MED ORDER — HYDROCODONE-ACETAMINOPHEN 5-325 MG PO TABS: 1.0000 | ORAL_TABLET | ORAL | 0 refills | Status: DC | PRN

## 2016-12-01 MED ORDER — DEXAMETHASONE SODIUM PHOSPHATE 10 MG/ML IJ SOLN
10.0000 mg | Freq: Once | INTRAMUSCULAR | Status: AC
  Administered 2016-12-01: 10 mg via INTRAVENOUS
  Filled 2016-12-01: qty 1

## 2016-12-01 NOTE — ED Notes (Signed)
Bed: WTR5 Expected date:  Expected time:  Means of arrival:  Comments: 

## 2016-12-01 NOTE — ED Provider Notes (Signed)
[facility] DEPT Provider Note   CSN: [id number] Arrival date & time: 12/01/16  5573     History   Chief Complaint Chief Complaint  Patient presents with  . Sore Throat    HPI Benjamin [name] is a 71 y.o. male with a past medical history significant for prostate cancer who presents with a five-day history of chills and sore throat. Patient says that he is had worsening throat pain and pain with jaw opening. Patient says that he has had no cough or congestion. He says that he still able to swallow some solids but with severe pain. He says that he is tolerating his secretions and fluids. He denies any nausea, vomiting, constipation, diarrhea, dysuria. He denies any chest pain or shortness of breath. He denies any headache or injuries. Patient says it is painful to open his mouth but he is able to move his neck in all directions without significant pain. Patient has not taken his temperature at home but reports ongoing chills. He denies any sick contacts. He describes his throat pain is worse on the left than the right and is a 6 out of 10 in severity on arrival. He denies any Voice change.     The history is provided by the patient.  Sore Throat  This is a new problem. The current episode started more than 2 days ago. The problem occurs constantly. The problem has been gradually worsening. Pertinent negatives include no chest pain, no abdominal pain, no headaches and no shortness of breath. The symptoms are aggravated by eating and swallowing. Nothing relieves the symptoms. He has tried nothing for the symptoms. The treatment provided no relief.    Past Medical History:  Diagnosis Date  . Prostate cancer ([place])     There are no active problems to display for this patient.   Past Surgical History:  Procedure Laterality Date  . EYE SURGERY    . PROSTATE SURGERY         Home Medications    Prior to Admission medications   Medication Sig Start Date End Date Taking?  Authorizing Provider  aspirin EC 81 MG tablet Take 81 mg by mouth daily.    Historical Provider, MD  b complex vitamins tablet Take 1 tablet by mouth daily.    Historical Provider, MD  benzonatate (TESSALON PERLES) 100 MG capsule Take 1 capsule (100 mg total) by mouth every 6 (six) hours as needed for cough. 09/04/16   Loney Hering, MD  cetirizine (ZYRTEC) 10 MG tablet Take 1 tablet (10 mg total) by mouth daily. 1 po q day prn allergies 06/23/16   Tanna Furry, MD  cetirizine (ZYRTEC) 10 MG tablet Take 1 tablet (10 mg total) by mouth daily. 1 po q day prn allergies 06/23/16   Tanna Furry, MD  cholecalciferol (VITAMIN D) 1000 UNITS tablet Take 1,000 Units by mouth daily.    Historical Provider, MD  Flaxseed, Linseed, (FLAX SEEDS) POWD Take 1 scoop by mouth every Monday, Wednesday, and Friday. Mixes with green tea    Historical Provider, MD  fluticasone (FLONASE) 50 MCG/ACT nasal spray 1 spray each nares bid 06/23/16   Tanna Furry, MD  Ginger Root POWD Take 1 scoop by mouth every Monday, Wednesday, and Friday. Mixes in tea    Historical Provider, MD  influenza vac recombinant HA trivalent (FLUBLOK) injection Inject 0.5 mLs into the muscle once. Reported on 12/31/2015    Historical Provider, MD  [place]    Historical Provider,  MD  predniSONE (DELTASONE) 20 MG tablet Take 3 tablets (60 mg total) by mouth daily. 09/04/16   Loney Hering, MD  tamsulosin (FLOMAX) 0.4 MG CAPS capsule Take 1 capsule (0.4 mg total) by mouth daily. 06/23/16   Tanna Furry, MD  Turmeric, Lear Ng, (TURMERIC ROOT) POWD Take 1 scoop by mouth every Monday, Wednesday, and Friday.    Historical Provider, MD    Family History No family history on file.  Social History Social History  Substance Use Topics  . Smoking status: Former Research scientist (life sciences)  . Smokeless tobacco: Never Used  . Alcohol use No     Allergies   Patient has no known allergies.   Review of Systems Review of Systems  Constitutional:  Positive for chills. Negative for diaphoresis, fatigue and fever.  HENT: Positive for sore throat. Negative for congestion, rhinorrhea, sinus pain, sneezing, trouble swallowing and voice change.   Eyes: Negative for visual disturbance.  Respiratory: Negative for cough, chest tightness, shortness of breath, wheezing and stridor.   Cardiovascular: Negative for chest pain.  Gastrointestinal: Negative for abdominal pain.  Genitourinary: Negative for dysuria.  Musculoskeletal: Negative for back pain, neck pain and neck stiffness.  Skin: Negative for rash.  Neurological: Negative for speech difficulty, weakness, light-headedness and headaches.  Psychiatric/Behavioral: Negative for agitation.  All other systems reviewed and are negative.    Physical Exam Updated Vital Signs BP 121/82 (BP Location: Right Arm)   Pulse 73   Temp 98 F (36.7 C) (Oral)   Resp 18   Ht 6' (1.829 m)   Wt 230 lb (104.3 kg)   SpO2 96%   BMI 31.19 kg/m   Physical Exam  Constitutional: He is oriented to person, place, and time. He appears well-developed and well-nourished. No distress.  HENT:  Head: Atraumatic.  Right Ear: External ear normal.  Left Ear: External ear normal.  Nose: Nose normal.  Mouth/Throat: No uvula swelling. Posterior oropharyngeal edema and tonsillar abscesses (L sided peritonsilar swelling, unclear if underlying abscecss) present. Tonsils are 1+ on the right. Tonsils are 3+ on the left.    Eyes: Conjunctivae and EOM are normal. Pupils are equal, round, and reactive to light.  Neck: Normal range of motion. Neck supple. No tracheal deviation present.  Cardiovascular: Normal rate and intact distal pulses.   No murmur heard. Pulmonary/Chest: Effort normal and breath sounds normal. No stridor. No respiratory distress. He has no wheezes. He exhibits no tenderness.  Abdominal: Soft. There is no tenderness. There is no rebound and no guarding.  Musculoskeletal: He exhibits no edema.    Neurological: He is alert and oriented to person, place, and time. No sensory deficit. He exhibits normal muscle tone.  Skin: Skin is warm. Capillary refill takes less than 2 seconds. No rash noted. He is not diaphoretic. No erythema. No pallor.  Psychiatric: He has a normal mood and affect.  Nursing note and vitals reviewed.    ED Treatments / Results  Labs (all labs ordered are listed, but only abnormal results are displayed) Labs Reviewed  CBC WITH DIFFERENTIAL/PLATELET - Abnormal; Notable for the following:       Result Value   WBC 14.4 (*)    Neutro Abs 11.1 (*)    Monocytes Absolute 1.8 (*)    All other components within normal limits  COMPREHENSIVE METABOLIC PANEL - Abnormal; Notable for the following:    Glucose, Bld 101 (*)    Calcium 8.8 (*)    AST 42 (*)    All  other components within normal limits  RAPID STREP SCREEN (NOT AT [name])  CULTURE, GROUP A STREP ([place])  PROTIME-INR  I-STAT CG4 LACTIC ACID, ED  I-STAT CG4 LACTIC ACID, ED    EKG  EKG Interpretation None       Radiology Ct Soft Tissue Neck W Contrast  Result Date: 12/01/2016 CLINICAL DATA:  71 year old male with sore throat for 4 days. Unable to swallow solids. Initial encounter. EXAM: CT NECK WITH CONTRAST TECHNIQUE: Multidetector CT imaging of the neck was performed using the standard protocol following the bolus administration of intravenous contrast. CONTRAST:  40mL ISOVUE-300 IOPAMIDOL (ISOVUE-300) INJECTION 61% COMPARISON:  Chest radiographs 03/20/2015 FINDINGS: Pharynx and larynx: Negative larynx. The hypopharynx is within normal limits. Enlarged bilateral palatine tonsils. Heterogeneous enhancement of the lateral aspect of the left tonsillar pillar (series 2, image 40), but no discrete tonsillar abscess at this time. Superimposed mild oropharynx mucosal space soft tissue thickening. The adenoids and nasopharynx have a more normal appearance. Borderline to mild bilateral parapharyngeal space  stranding. Retropharyngeal space is within normal limits. Salivary glands: Negative sublingual space, submandibular glands and parotid glands. Thyroid: Negative. Lymph nodes: Increased size and number of left level 2 lymph nodes which are rounded and individually measure up to 15 mm short axis. Level IIa and IIb affected on the left side. Right level IIa and to a lesser extent 2 be increased lymph node size and number, measuring up to 13 mm. Lesser bilateral level IIIb lymph node enlargement. Level 1, level 4, and level 5 are spared. No cystic or necrotic nodes. Vascular: Major vascular structures in the neck and at the skullbase are patent. Mild soft and calcified carotid bifurcation atherosclerosis. Both internal jugular veins are patent. Limited intracranial: Negative. Visualized orbits: Postoperative changes to both globes, otherwise negative. Mastoids and visualized paranasal sinuses: Clear. Skeleton: Most of the posterior dentition is absent. No acute dental findings identified. Lower cervical disc and endplate degeneration with bulky anterior endplate spurring at N5-A2. No acute osseous abnormality identified. Upper chest: Normal visualized superior mediastinum (incidental small right tracheal diverticulum, inconsequential). Negative lung apices. IMPRESSION: 1. Bilateral palatine tonsillar enlargement and mild parapharyngeal space inflammation. Heterogeneous left tonsil might reflect developing suppuration, but there is no discrete tonsillar or neck abscess at this time. 2. Left greater than right level 2 and level 3 lymphadenopathy, favor reactive. No suppurative of or necrotic lymph nodes. Electronically Signed   By: Genevie Ann M.D.   On: 12/01/2016 12:19    Procedures Procedures (including critical care time)  Medications Ordered in ED Medications  dexamethasone (DECADRON) injection 10 mg (10 mg Intravenous Given 12/01/16 1041)  morphine 2 MG/ML injection 4 mg (4 mg Intravenous Given 12/01/16 1041)   sodium chloride 0.9 % bolus 1,000 mL (0 mLs Intravenous Stopped 12/01/16 1140)  iopamidol (ISOVUE-300) 61 % injection 75 mL (75 mLs Intravenous Contrast Given 12/01/16 1141)  clindamycin (CLEOCIN) capsule 450 mg (450 mg Oral Given 12/01/16 1346)     Initial Impression / Assessment and Plan / ED Course  I have reviewed the triage vital signs and the nursing notes.  Pertinent labs & imaging results that were available during my care of the patient were reviewed by me and considered in my medical decision making (see chart for details).     Benjamin [name] is a 71 y.o. male with a past medical history significant for prostate cancer who presents with a five-day history of chills and sore throat.    history and exam are seen above.  On exam, patient has mild trismus. Patient's mouth was opened with a tongue depressor, patient was found to have asymmetry of the oropharynx. Patient appears to have peritonsillar abscess on the left. Patient's uvula is not midline, it is shifted to the right. Patient has posterior oropharyngeal erythema. Patient has full range of motion of neck. Patient had no stridor on exam. Lungs were clear abdomen and chest were nontender.  Given patient's severe pain and swelling in his throat as well as findings on exam, patient will have workup to look for PTA or deep space neck infection. Patient given Decadron, pain medicine, and fluids. Labs obtained and CT imaging will be performed.  Diagnostic testing showed leukocytosis. Lactic acid nonelevated. CT scan showed tonsillar inflammation and swelling but no evidence of peritonsillar abscess.  Given lack of abscess in patients ability to swallow with no respiratory where we compromise, feel that patient's request for discharge home is reasonable. Patient did receive steroids in the ED. Patient given a dose of clindamycin in ED and also be given prescription for discharge. Patient fell near resolution of pain with pain  medication. Patient will be given prescription for pain medication.  Patient given extremely strict return precautions due to his tonsillitis and inflammation near the airway. Patient voiced understanding of the plan of care and the gravity of throat infections. Patient will return for any changes or worsening of symptoms. Patient will follow up with PCP and patient was discharged in good condition.   Final Clinical Impressions(s) / ED Diagnoses   Final diagnoses:  Tonsillitis    New Prescriptions Discharge Medication List as of 12/01/2016  1:29 PM    START taking these medications   Details  clindamycin (CLEOCIN) 150 MG capsule Take 3 capsules (450 mg total) by mouth 3 (three) times daily., Starting Wed 12/01/2016, Until Sat 12/11/2016, Print    HYDROcodone-acetaminophen (NORCO/VICODIN) 5-325 MG tablet Take 1 tablet by mouth every 4 (four) hours as needed., Starting Wed 12/01/2016, Print        Clinical Impression: 1. Tonsillitis     Disposition: Discharge  Condition: Good  I have discussed the results, Dx and Tx plan with the pt(& family if present). He/she/they expressed understanding and agree(s) with the plan. Discharge instructions discussed at great length. Strict return precautions discussed and pt &/or family have verbalized understanding of the instructions. No further questions at time of discharge.    Discharge Medication List as of 12/01/2016  1:29 PM    START taking these medications   Details  clindamycin (CLEOCIN) 150 MG capsule Take 3 capsules (450 mg total) by mouth 3 (three) times daily., Starting Wed 12/01/2016, Until Sat 12/11/2016, Print    HYDROcodone-acetaminophen (NORCO/VICODIN) 5-325 MG tablet Take 1 tablet by mouth every 4 (four) hours as needed., Starting Wed 12/01/2016, Print        Follow Up: [place] [address] [phone]  Schedule an appointment as soon as possible for a visit     [facility] [place] [id number] [place] [address] [phone]  If symptoms worsen     Courtney Paris, MD 12/01/16 1928

## 2016-12-01 NOTE — ED Notes (Signed)
Pt verbalized understanding of discharge instructions and denies any further questions at this time.   

## 2016-12-01 NOTE — ED Notes (Signed)
ED Provider at bedside. 

## 2016-12-01 NOTE — ED Notes (Signed)
Awaiting meds from pharmacy  

## 2016-12-01 NOTE — ED Triage Notes (Signed)
Several days of sore throat, diff swallowing, tenderness, unable to assess well due to pt not able to open wide

## 2016-12-01 NOTE — ED Notes (Signed)
Med wait

## 2016-12-01 NOTE — Discharge Instructions (Signed)
Please take your antibiotics to treat your infection. Her steroids will last for the next few days. Please use the pain medicine if needed for discomfort. If any symptoms change or worsen, please return to the nearest emergency department.

## 2016-12-03 LAB — CULTURE, GROUP A STREP (THRC)

## 2016-12-04 ENCOUNTER — Telehealth: Payer: Self-pay

## 2016-12-04 NOTE — Telephone Encounter (Signed)
Post ED Visit - Positive Culture Follow-up  Culture report reviewed by antimicrobial stewardship pharmacist:  []  Elenor Quinones, Pharm.D. []  Heide Guile, Pharm.D., BCPS AQ-ID []  Parks Neptune, Pharm.D., BCPS []  Alycia Rossetti, Pharm.D., BCPS []  [place], Pharm.D., BCPS, AAHIVP []  Legrand Como, Pharm.D., BCPS, AAHIVP []  Salome Arnt, PharmD, BCPS [x]  Dimitri Ped, PharmD, BCPS []  Vincenza Hews, PharmD, BCPS  Positive strep culture Treated with Clindamycin, organism sensitive to the same and no further patient follow-up is required at this time.  Genia Del 12/04/2016, 10:03 AM

## 2017-08-08 ENCOUNTER — Other Ambulatory Visit: Payer: Self-pay

## 2017-08-08 ENCOUNTER — Emergency Department (HOSPITAL_COMMUNITY)
Admission: EM | Admit: 2017-08-08 | Discharge: 2017-08-08 | Disposition: A | Discharge status: Home / Self Care | Payer: Medicare Other | Attending: Emergency Medicine | Admitting: Emergency Medicine

## 2017-08-08 ENCOUNTER — Encounter (HOSPITAL_COMMUNITY): Payer: Self-pay | Admitting: Emergency Medicine

## 2017-08-08 ENCOUNTER — Emergency Department (HOSPITAL_COMMUNITY): Payer: Medicare Other

## 2017-08-08 DIAGNOSIS — Z79899 Other long term (current) drug therapy: Secondary | ICD-10-CM | POA: Diagnosis not present

## 2017-08-08 DIAGNOSIS — Z7982 Long term (current) use of aspirin: Secondary | ICD-10-CM | POA: Diagnosis not present

## 2017-08-08 DIAGNOSIS — Z87891 Personal history of nicotine dependence: Secondary | ICD-10-CM | POA: Diagnosis not present

## 2017-08-08 DIAGNOSIS — J40 Bronchitis, not specified as acute or chronic: Secondary | ICD-10-CM | POA: Insufficient documentation

## 2017-08-08 DIAGNOSIS — R05 Cough: Secondary | ICD-10-CM | POA: Diagnosis present

## 2017-08-08 DIAGNOSIS — Z8546 Personal history of malignant neoplasm of prostate: Secondary | ICD-10-CM | POA: Insufficient documentation

## 2017-08-08 MED ORDER — BENZONATATE 100 MG PO CAPS: 100.0000 mg | ORAL_CAPSULE | Freq: Three times a day (TID) | ORAL | 0 refills | Status: DC

## 2017-08-08 MED ORDER — FLUTICASONE PROPIONATE 50 MCG/ACT NA SUSP: 1.0000 | Freq: Every day | NASAL | 2 refills | Status: DC

## 2017-08-08 MED ORDER — AZITHROMYCIN 250 MG PO TABS: ORAL_TABLET | ORAL | 0 refills | Status: DC

## 2017-08-08 NOTE — ED Triage Notes (Signed)
Pt complaint of ongoing productive cough ongoing for a month; unrelieved by OTC medications; "I am just trying to get rid of it."

## 2017-08-08 NOTE — ED Provider Notes (Signed)
[facility] DEPT Provider Note   CSN: [id number] Arrival date & time: 08/08/17  1051     History   Chief Complaint Chief Complaint  Patient presents with  . Cough    HPI Benjamin [name] is a 71 y.o. male who presents to the ED with a cough that has been persistent for the past 2 weeks. Patient reports that every year about this time he gets similar problem and takes OTC medications, as he has done this time, but does not get better until he get a Z-pak.   HPI  Past Medical History:  Diagnosis Date  . Prostate cancer ([place]) dx'd 2010   surg only    There are no active problems to display for this patient.   Past Surgical History:  Procedure Laterality Date  . EYE SURGERY    . PROSTATE SURGERY         Home Medications    Prior to Admission medications   Medication Sig Start Date End Date Taking? Authorizing Provider  aspirin EC 81 MG tablet Take 81 mg by mouth at bedtime.     [provider]  azithromycin (ZITHROMAX Z-PAK) 250 MG tablet Take the first 2 tablets now and then one tablet PO daily. 08/08/17   Ashley Murrain, NP  benzonatate (TESSALON) 100 MG capsule Take 1 capsule (100 mg total) by mouth every 8 (eight) hours. 08/08/17   Ashley Murrain, NP  fluticasone (FLONASE) 50 MCG/ACT nasal spray Place 1 spray into both nostrils daily. 08/08/17   Ashley Murrain, NP  HYDROcodone-acetaminophen (NORCO/VICODIN) 5-325 MG tablet Take 1 tablet by mouth every 4 (four) hours as needed. 12/01/16   Tegeler, Gwenyth Allegra, MD  Multiple Vitamin (MULTIVITAMIN WITH MINERALS) TABS tablet Take 1 tablet by mouth daily.    [provider]  OVER THE COUNTER MEDICATION once a week. Curry, flax seed, turmeric, cinnamon - mixes all together in green tea    [provider]  predniSONE (DELTASONE) 20 MG tablet Take 3 tablets (60 mg total) by mouth daily. Patient not taking: Reported on 12/01/2016 09/04/16   Loney Hering, MD     Family History No family history on file.  Social History Social History   Tobacco Use  . Smoking status: Former Research scientist (life sciences)  . Smokeless tobacco: Never Used  Substance Use Topics  . Alcohol use: No  . Drug use: No     Allergies   Patient has no known allergies.   Review of Systems Review of Systems  Constitutional: Negative for chills and fever.  HENT: Positive for congestion, sinus pressure, sinus pain and sneezing. Negative for trouble swallowing.   Eyes: Negative for redness, itching and visual disturbance.  Respiratory: Positive for cough. Negative for shortness of breath.   Cardiovascular: Negative for chest pain.  Gastrointestinal: Negative for abdominal pain, nausea and vomiting.  Musculoskeletal: Negative for back pain and neck stiffness.  Skin: Negative for rash.  Neurological: Negative for syncope and headaches.  Hematological: Negative for adenopathy.  Psychiatric/Behavioral: Negative for confusion.     Physical Exam Updated Vital Signs BP (!) 134/99 (BP Location: Left Arm)   Pulse 60   Temp 97.9 F (36.6 C) (Oral)   Resp 20   Ht 6\' 1"  (1.854 m)   Wt 109.3 kg (241 lb)   SpO2 99%   BMI 31.80 kg/m   Physical Exam  Constitutional: He is oriented to person, place, and time. He appears well-developed and well-nourished. No distress.  HENT:  Head: Normocephalic and atraumatic.  Right Ear: Tympanic membrane normal.  Left Ear: Tympanic membrane normal.  Nose: Right sinus exhibits maxillary sinus tenderness.  Mouth/Throat: Uvula is midline, oropharynx is clear and moist and mucous membranes are normal.  Eyes: EOM are normal.  Neck: Normal range of motion. Neck supple.  Cardiovascular: Normal rate.  Pulmonary/Chest: Effort normal and breath sounds normal.  Abdominal: Soft. There is no tenderness.  Musculoskeletal: Normal range of motion.  Neurological: He is alert and oriented to person, place, and time. No cranial nerve deficit.  Skin: Skin is warm  and dry.  Psychiatric: He has a normal mood and affect. His behavior is normal.  Nursing note and vitals reviewed.    ED Treatments / Results  Labs (all labs ordered are listed, but only abnormal results are displayed) Labs Reviewed - No data to display  Radiology Dg Chest 2 View  Result Date: 08/08/2017 CLINICAL DATA:  Productive cough. EXAM: CHEST  2 VIEW COMPARISON:  Radiographs of March 20, 2015. FINDINGS: The heart size and mediastinal contours are within normal limits. Both lungs are clear. No pneumothorax or pleural effusion is noted. The visualized skeletal structures are unremarkable. IMPRESSION: No active cardiopulmonary disease. Electronically Signed   By: Marijo Conception, M.D.   On: 08/08/2017 11:55    Procedures Procedures (including critical care time)  Medications Ordered in ED Medications - No data to display   Initial Impression / Assessment and Plan / ED Course  I have reviewed the triage vital signs and the nursing notes. Dr. Lacinda Axon in to see the patient.  Pt CXR negative for acute infiltrate. Patients symptoms are consistent with bronchitis. Pt will be discharged with z-pack, tessalon and flonase. Discussed plan of care with patient and he Verbalizes understanding and is agreeable with plan. Pt is hemodynamically stable & in NAD prior to dc.  Final Clinical Impressions(s) / ED Diagnoses   Final diagnoses:  Bronchitis    ED Discharge Orders        Ordered    fluticasone (FLONASE) 50 MCG/ACT nasal spray  Daily     08/08/17 1458    benzonatate (TESSALON) 100 MG capsule  Every 8 hours     08/08/17 1458    azithromycin (ZITHROMAX Z-PAK) 250 MG tablet     08/08/17 1458       Debroah Baller [place], NP 08/08/17 1507    Nat Christen, MD 08/09/17 [phone]

## 2017-08-08 NOTE — ED Notes (Signed)
Pt reports coughing spells at night x 1 month and has been intermittent. Pt denies pain at this time.

## 2017-08-08 NOTE — Discharge Instructions (Signed)
Follow up with your doctor.  Return here as needed 

## 2017-08-14 ENCOUNTER — Emergency Department (HOSPITAL_COMMUNITY)
Admission: EM | Admit: 2017-08-14 | Discharge: 2017-08-14 | Disposition: A | Discharge status: Home / Self Care | Payer: Medicare Other | Attending: Emergency Medicine | Admitting: Emergency Medicine

## 2017-08-14 ENCOUNTER — Emergency Department (HOSPITAL_COMMUNITY): Payer: Medicare Other

## 2017-08-14 ENCOUNTER — Encounter (HOSPITAL_COMMUNITY): Payer: Self-pay | Admitting: Emergency Medicine

## 2017-08-14 DIAGNOSIS — R059 Cough, unspecified: Secondary | ICD-10-CM

## 2017-08-14 DIAGNOSIS — Z7982 Long term (current) use of aspirin: Secondary | ICD-10-CM | POA: Insufficient documentation

## 2017-08-14 DIAGNOSIS — R05 Cough: Secondary | ICD-10-CM | POA: Diagnosis not present

## 2017-08-14 DIAGNOSIS — Z79899 Other long term (current) drug therapy: Secondary | ICD-10-CM | POA: Insufficient documentation

## 2017-08-14 DIAGNOSIS — Z87891 Personal history of nicotine dependence: Secondary | ICD-10-CM | POA: Diagnosis not present

## 2017-08-14 DIAGNOSIS — R1013 Epigastric pain: Secondary | ICD-10-CM | POA: Insufficient documentation

## 2017-08-14 MED ORDER — GUAIFENESIN 100 MG/5ML PO SOLN: 5.0000 mL | ORAL | 0 refills | Status: DC | PRN

## 2017-08-14 MED ORDER — OMEPRAZOLE 20 MG PO CPDR: 20.0000 mg | DELAYED_RELEASE_CAPSULE | Freq: Every day | ORAL | 0 refills | Status: DC

## 2017-08-14 NOTE — ED Provider Notes (Signed)
[facility] DEPT Provider Note   CSN: [id number] Arrival date & time: 08/14/17  1000     History   Chief Complaint Chief Complaint  Patient presents with  . Cough    HPI Benjamin [name] is a 71 y.o. male.  HPI 71 year old male who presents with cough.  He has a previous history of prostate cancer.  States that he was seen in the ED 1 week ago for cough and congestion.  Was given a Z-Pak.  States that he continues to have persistent cough and congestion.  Denies any fever, sore throat, nausea or vomiting, myalgias, difficulty breathing, or chest pain.  States that his cough typically is worse at nighttime when he is sleeping.  Describes mild epigastric burning associated with this at night as well.   Past Medical History:  Diagnosis Date  . Prostate cancer ([place]) dx'd 2010   surg only    There are no active problems to display for this patient.   Past Surgical History:  Procedure Laterality Date  . EYE SURGERY    . PROSTATE SURGERY         Home Medications    Prior to Admission medications   Medication Sig Start Date End Date Taking? Authorizing Provider  aspirin EC 81 MG tablet Take 81 mg by mouth at bedtime.     [provider]  azithromycin (ZITHROMAX Z-PAK) 250 MG tablet Take the first 2 tablets now and then one tablet PO daily. 08/08/17   Ashley Murrain, NP  benzonatate (TESSALON) 100 MG capsule Take 1 capsule (100 mg total) by mouth every 8 (eight) hours. 08/08/17   Ashley Murrain, NP  fluticasone (FLONASE) 50 MCG/ACT nasal spray Place 1 spray into both nostrils daily. 08/08/17   Ashley Murrain, NP  HYDROcodone-acetaminophen (NORCO/VICODIN) 5-325 MG tablet Take 1 tablet by mouth every 4 (four) hours as needed. 12/01/16   Tegeler, Gwenyth Allegra, MD  Multiple Vitamin (MULTIVITAMIN WITH MINERALS) TABS tablet Take 1 tablet by mouth daily.    [provider]  OVER THE COUNTER MEDICATION once a week. Curry, flax seed,  turmeric, cinnamon - mixes all together in green tea    [provider]  predniSONE (DELTASONE) 20 MG tablet Take 3 tablets (60 mg total) by mouth daily. Patient not taking: Reported on 12/01/2016 09/04/16   Loney Hering, MD    Family History No family history on file.  Social History Social History   Tobacco Use  . Smoking status: Former Research scientist (life sciences)  . Smokeless tobacco: Never Used  Substance Use Topics  . Alcohol use: No  . Drug use: No     Allergies   Patient has no known allergies.   Review of Systems Review of Systems  Constitutional: Negative for fever.  HENT: Positive for congestion. Negative for sore throat.   Respiratory: Positive for cough.   Cardiovascular: Negative for chest pain and leg swelling.  Gastrointestinal: Negative for abdominal pain, diarrhea, nausea and vomiting.  Allergic/Immunologic: Negative for immunocompromised state.  Hematological: Does not bruise/bleed easily.     Physical Exam Updated Vital Signs BP 136/86 (BP Location: Left Arm)   Pulse 69   Temp 97.7 F (36.5 C) (Oral)   Resp 15   SpO2 97%   Physical Exam Physical Exam  Nursing note and vitals reviewed. Constitutional: Well developed, well nourished, non-toxic, and in no acute distress Head: Normocephalic and atraumatic.  Mouth/Throat: Oropharynx is clear and moist.  Neck: Normal range of motion. Neck  supple.  Cardiovascular: Normal rate and regular rhythm.   Pulmonary/Chest: Effort normal and breath sounds normal.  Abdominal: Soft. There is no tenderness. There is no rebound and no guarding.  Musculoskeletal: Normal range of motion.  Neurological: Alert, no facial droop, fluent speech, moves all extremities symmetrically Skin: Skin is warm and dry.  Psychiatric: Cooperative   ED Treatments / Results  Labs (all labs ordered are listed, but only abnormal results are displayed) Labs Reviewed - No data to display  EKG  EKG Interpretation None        Radiology Dg Chest 2 View  Result Date: 08/14/2017 CLINICAL DATA:  Productive cough for over 1 month. The patient has completed a round of antibiotic therapy. EXAM: CHEST  2 VIEW COMPARISON:  PA and lateral chest 08/08/2017 and 03/20/2015. FINDINGS: Mild subsegmental atelectasis is seen lung bases. No consolidative process, pneumothorax or effusion. There is some peribronchial thickening. Heart size is normal. IMPRESSION: Peribronchial thickening compatible with bronchitis. Mild subsegmental bibasilar atelectasis. Electronically Signed   By: Inge Rise M.D.   On: 08/14/2017 12:31    Procedures Procedures (including critical care time)  Medications Ordered in ED Medications - No data to display   Initial Impression / Assessment and Plan / ED Course  I have reviewed the triage vital signs and the nursing notes.  Pertinent labs & imaging results that were available during my care of the patient were reviewed by me and considered in my medical decision making (see chart for details).     71 year old male who presents with persistent cough after recent treatment with azithromycin.  He is very well-appearing in no acute distress.  Vital signs are within normal limits.  He is breathing comfortably.  Lungs overall are clear on exam.  Soft and benign abdomen.  Repeat chest x-ray was performed to rule out new infiltrate, edema or other acute cardiopulmonary processes.  This is visualized and shows possible bronchitis picture but no other acute processes.  His symptoms of also nighttime cough, with some occasional burning in the epigastrium is also suggestive of GERD.  Watch start him on omeprazole for treatment.  Also discussed supportive care for potential bronchitis. Strict return and follow-up instructions reviewed. He expressed understanding of all discharge instructions and felt comfortable with the plan of care.   Final Clinical Impressions(s) / ED Diagnoses   Final diagnoses:   Cough    ED Discharge Orders    None       Forde Dandy, MD 08/14/17 1301

## 2017-08-14 NOTE — ED Triage Notes (Signed)
Patient coming in for follow-up on cough since he was seen here on 17th for bronchitis and finished z-pack and reports cough is still going on. Patient reports that he cant get a hold of his PCP to follow up with him.  Reports sinuses has cleared up.

## 2017-08-14 NOTE — Discharge Instructions (Signed)
Your cough may be due to reflux or a virus. Your chest x-ray does not show bacterial pneumonia.  Please take omeprazole for reflux. This may take a few weeks for this medication to kick in. It may also take 2 weeks or so for bronchitis symptoms to improve from virus. You can take cough medications as needed Please return without fail for worsening symptoms, including fever, difficulty breathing, confusion, passing out, severe chest pain or any other symptoms concerning to you.

## 2017-08-14 NOTE — ED Notes (Signed)
Bed: FM38 Expected date:  Expected time:  Means of arrival:  Comments: 71 yo foot ulcer

## 2017-08-14 NOTE — ED Notes (Signed)
Pt stated that coughing is worse at night when laying down. Pt stated he took Z-pack and symptoms disappeared and now symptoms have re-appeared. No fever, throat has some discomfort from coughing and pt states that he has some epigastric pain / lower mid chest pain that comes and goes that has burning sensation.   Requested RT, RT hear clear and diminished bilateral lungs.

## 2018-04-17 ENCOUNTER — Emergency Department (HOSPITAL_COMMUNITY): Payer: Medicare Other

## 2018-04-17 ENCOUNTER — Encounter (HOSPITAL_COMMUNITY): Payer: Self-pay

## 2018-04-17 ENCOUNTER — Other Ambulatory Visit: Payer: Self-pay

## 2018-04-17 ENCOUNTER — Emergency Department (HOSPITAL_COMMUNITY)
Admission: EM | Admit: 2018-04-17 | Discharge: 2018-04-17 | Disposition: A | Discharge status: Home / Self Care | Payer: Medicare Other | Attending: Emergency Medicine | Admitting: Emergency Medicine

## 2018-04-17 DIAGNOSIS — R05 Cough: Secondary | ICD-10-CM

## 2018-04-17 DIAGNOSIS — Z7982 Long term (current) use of aspirin: Secondary | ICD-10-CM | POA: Insufficient documentation

## 2018-04-17 DIAGNOSIS — R918 Other nonspecific abnormal finding of lung field: Secondary | ICD-10-CM | POA: Insufficient documentation

## 2018-04-17 DIAGNOSIS — R079 Chest pain, unspecified: Secondary | ICD-10-CM | POA: Insufficient documentation

## 2018-04-17 DIAGNOSIS — Z87891 Personal history of nicotine dependence: Secondary | ICD-10-CM | POA: Insufficient documentation

## 2018-04-17 DIAGNOSIS — Z79899 Other long term (current) drug therapy: Secondary | ICD-10-CM | POA: Diagnosis not present

## 2018-04-17 DIAGNOSIS — R059 Cough, unspecified: Secondary | ICD-10-CM

## 2018-04-17 LAB — CBC WITH DIFFERENTIAL/PLATELET
BASOS ABS: 0 10*3/uL (ref 0.0–0.1)
Basophils Relative: 1 %
EOS ABS: 0.1 10*3/uL (ref 0.0–0.7)
EOS PCT: 3 %
HCT: 39.3 % (ref 39.0–52.0)
Hemoglobin: 12.5 g/dL — ABNORMAL LOW (ref 13.0–17.0)
Lymphocytes Relative: 36 %
Lymphs Abs: 1.8 10*3/uL (ref 0.7–4.0)
MCH: 29.1 pg (ref 26.0–34.0)
MCHC: 31.8 g/dL (ref 30.0–36.0)
MCV: 91.4 fL (ref 78.0–100.0)
Monocytes Absolute: 0.4 10*3/uL (ref 0.1–1.0)
Monocytes Relative: 8 %
Neutro Abs: 2.7 10*3/uL (ref 1.7–7.7)
Neutrophils Relative %: 52 %
PLATELETS: 239 10*3/uL (ref 150–400)
RBC: 4.3 MIL/uL (ref 4.22–5.81)
RDW: 12.9 % (ref 11.5–15.5)
WBC: 5.1 10*3/uL (ref 4.0–10.5)

## 2018-04-17 LAB — COMPREHENSIVE METABOLIC PANEL
ALT: 17 U/L (ref 0–44)
AST: 22 U/L (ref 15–41)
Albumin: 3.5 g/dL (ref 3.5–5.0)
Alkaline Phosphatase: 42 U/L (ref 38–126)
Anion gap: 6 (ref 5–15)
BILIRUBIN TOTAL: 0.5 mg/dL (ref 0.3–1.2)
BUN: 9 mg/dL (ref 8–23)
CO2: 28 mmol/L (ref 22–32)
CREATININE: 0.9 mg/dL (ref 0.61–1.24)
Calcium: 8.9 mg/dL (ref 8.9–10.3)
Chloride: 106 mmol/L (ref 98–111)
GFR calc Af Amer: >60 mL/min (ref >60)
Glucose, Bld: 103 mg/dL — ABNORMAL HIGH (ref 70–99)
POTASSIUM: 4 mmol/L (ref 3.5–5.1)
Sodium: 140 mmol/L (ref 135–145)
TOTAL PROTEIN: 7 g/dL (ref 6.5–8.1)

## 2018-04-17 LAB — BRAIN NATRIURETIC PEPTIDE: B NATRIURETIC PEPTIDE 5: 104 pg/mL — AB (ref 0.0–100.0)

## 2018-04-17 LAB — TROPONIN I: Troponin I: <0.03 ng/mL (ref <0.03)

## 2018-04-17 MED ORDER — AZITHROMYCIN 250 MG PO TABS: 250.0000 mg | ORAL_TABLET | Freq: Every day | ORAL | 0 refills | Status: AC

## 2018-04-17 MED ORDER — AZITHROMYCIN 250 MG PO TABS
500.0000 mg | ORAL_TABLET | Freq: Once | ORAL | Status: AC
  Administered 2018-04-17: 500 mg via ORAL
  Filled 2018-04-17: qty 2

## 2018-04-17 NOTE — Discharge Instructions (Signed)
As discussed, today's evaluation has been generally reassuring.  But with your recurrent cough, it is important that you follow-up with your physician for appropriate ongoing evaluation and management.  Given the episodes of this have occurred over the past years, may consider additional studies, including echocardiogram.  Take all medication as directed, and do not hesitate to return here for concerning changes in your condition.

## 2018-04-17 NOTE — ED Notes (Signed)
Patient transported to X-ray 

## 2018-04-17 NOTE — ED Triage Notes (Signed)
Patient c/o a productive cough with white sputum x 2 days.

## 2018-04-17 NOTE — ED Provider Notes (Addendum)
[facility] DEPT Provider Note   CSN: [id number] Arrival date & time: 04/17/18  1119     History   Chief Complaint Chief Complaint  Patient presents with  . Cough    HPI Benjamin [name] is a 72 y.o. male.  HPI Presents with concern of cough, mild generalized discomfort. No fever, no nausea, vomiting.  Patient states that he is generally well, but that he has similar flexions about once a year. He notes that he was well prior to this episode which began about 5 days ago. Since onset no clear alleviating or exacerbating factors, and no medication taken for pain relief. He was a smoker in the very distant past, drinker in the very distant past as well.  Past Medical History:  Diagnosis Date  . Prostate cancer ([place]) dx'd 2010   surg only    There are no active problems to display for this patient.   Past Surgical History:  Procedure Laterality Date  . EYE SURGERY    . PROSTATE SURGERY          Home Medications    Prior to Admission medications   Medication Sig Start Date End Date Taking? Authorizing Provider  aspirin EC 81 MG tablet Take 81 mg by mouth at bedtime.     [provider]  azithromycin (ZITHROMAX Z-PAK) 250 MG tablet Take the first 2 tablets now and then one tablet PO daily. 08/08/17   Ashley Murrain, NP  fluticasone (FLONASE) 50 MCG/ACT nasal spray Place 1 spray into both nostrils daily. 08/08/17   Ashley Murrain, NP  guaiFENesin (ROBITUSSIN) 100 MG/5ML SOLN Take 5 mLs (100 mg total) by mouth every 4 (four) hours as needed for cough or to loosen phlegm. 08/14/17   Forde Dandy, MD  HYDROcodone-acetaminophen (NORCO/VICODIN) 5-325 MG tablet Take 1 tablet by mouth every 4 (four) hours as needed. 12/01/16   Tegeler, Gwenyth Allegra, MD  Multiple Vitamin (MULTIVITAMIN WITH MINERALS) TABS tablet Take 1 tablet by mouth daily.    [provider]  omeprazole (PRILOSEC) 20 MG capsule Take 1 capsule (20 mg total) by mouth  daily. 08/14/17   Forde Dandy, MD  OVER THE COUNTER MEDICATION once a week. Curry, flax seed, turmeric, cinnamon - mixes all together in green tea    [provider]    Family History History reviewed. No pertinent family history.  Social History Social History   Tobacco Use  . Smoking status: Former Research scientist (life sciences)  . Smokeless tobacco: Never Used  Substance Use Topics  . Alcohol use: No  . Drug use: No     Allergies   Patient has no known allergies.   Review of Systems Review of Systems  Constitutional:       Per HPI, otherwise negative  HENT:       Per HPI, otherwise negative  Respiratory:       Per HPI, otherwise negative  Cardiovascular:       Per HPI, otherwise negative  Gastrointestinal: Negative for vomiting.  Endocrine:       Negative aside from HPI  Genitourinary:       Neg aside from HPI   Musculoskeletal:       Per HPI, otherwise negative  Skin: Negative.   Neurological: Negative for syncope.     Physical Exam Updated Vital Signs BP (!) 145/86   Pulse 63   Temp 97.6 F (36.4 C) (Oral)   Resp 13   Ht 6\' 1"  (1.854 m)  Wt 113.4 kg   SpO2 96%   BMI 32.98 kg/m   Physical Exam  Constitutional: He is oriented to person, place, and time. He appears well-developed. No distress.  HENT:  Head: Normocephalic and atraumatic.  Eyes: Conjunctivae and EOM are normal.  Cardiovascular: Normal rate and regular rhythm.  Pulmonary/Chest: Effort normal. No stridor. No respiratory distress.  Abdominal: He exhibits no distension.  Musculoskeletal: He exhibits no edema.  Neurological: He is alert and oriented to person, place, and time.  Skin: Skin is warm and dry.  Psychiatric: He has a normal mood and affect.  Nursing note and vitals reviewed.    ED Treatments / Results  Labs (all labs ordered are listed, but only abnormal results are displayed) Labs Reviewed  COMPREHENSIVE METABOLIC PANEL - Abnormal; Notable for the following components:       Result Value   Glucose, Bld 103 (*)    All other components within normal limits  TROPONIN I  CBC WITH DIFFERENTIAL/PLATELET  BRAIN NATRIURETIC PEPTIDE    EKG None  Radiology Dg Chest 2 View  Result Date: 04/17/2018 CLINICAL DATA:  Productive cough for the past 2 days. History of seasonal allergies and chest discomfort. Former smoker. EXAM: CHEST - 2 VIEW COMPARISON:  Chest x-ray of August 14, 2017 FINDINGS: The lungs are borderline hypoinflated. The lung markings are coarse just above the hemidiaphragms. There are no air bronchograms. The heart and pulmonary vascularity are normal. The mediastinum is normal in width. The trachea is midline. The bony thorax exhibits no acute abnormality. IMPRESSION: Bilateral hypoinflation. Possible subsegmental atelectasis versus scarring at both lung bases. No discrete pneumonia and no pulmonary edema. Electronically Signed   By: David  Martinique M.D.   On: 04/17/2018 12:10    Procedures Procedures (including critical care time)  Medications Ordered in ED Medications - No data to display   Initial Impression / Assessment and Plan / ED Course  I have reviewed the triage vital signs and the nursing notes.  Pertinent labs & imaging results that were available during my care of the patient were reviewed by me and considered in my medical decision making (see chart for details).  Chart review notable for episodic similar events, including December last year. Patient notes that when he has a stable cough he typically moves with several days of azithromycin. This generally well-appearing male presents with several days of cough, mild generalized discomfort. He is awake, alert. He is a former smoker, some suspicion for bronchitis, no evidence for pneumonia. He does have mildly elevated BNP, but no evidence for decompensated heart failure. Patient encouraged to discuss this with physician, consider echocardiogram.  Labs reassuring, patient discharged in  stable condition.  Final Clinical Impressions(s) / ED Diagnoses  Cough   Carmin Muskrat, MD 04/17/18 1245    Carmin Muskrat, MD 04/17/18 1341

## 2018-10-03 ENCOUNTER — Emergency Department (HOSPITAL_COMMUNITY): Payer: Medicare Other

## 2018-10-03 ENCOUNTER — Emergency Department (HOSPITAL_COMMUNITY)
Admission: EM | Admit: 2018-10-03 | Discharge: 2018-10-03 | Disposition: A | Discharge status: Home / Self Care | Payer: Medicare Other | Attending: Emergency Medicine | Admitting: Emergency Medicine

## 2018-10-03 ENCOUNTER — Encounter (HOSPITAL_COMMUNITY): Payer: Self-pay | Admitting: Emergency Medicine

## 2018-10-03 ENCOUNTER — Other Ambulatory Visit: Payer: Self-pay

## 2018-10-03 DIAGNOSIS — Z79899 Other long term (current) drug therapy: Secondary | ICD-10-CM | POA: Diagnosis not present

## 2018-10-03 DIAGNOSIS — Z7982 Long term (current) use of aspirin: Secondary | ICD-10-CM | POA: Insufficient documentation

## 2018-10-03 DIAGNOSIS — J189 Pneumonia, unspecified organism: Secondary | ICD-10-CM | POA: Insufficient documentation

## 2018-10-03 DIAGNOSIS — J181 Lobar pneumonia, unspecified organism: Secondary | ICD-10-CM

## 2018-10-03 DIAGNOSIS — Z8546 Personal history of malignant neoplasm of prostate: Secondary | ICD-10-CM | POA: Diagnosis not present

## 2018-10-03 DIAGNOSIS — Z87891 Personal history of nicotine dependence: Secondary | ICD-10-CM | POA: Diagnosis not present

## 2018-10-03 DIAGNOSIS — R05 Cough: Secondary | ICD-10-CM | POA: Diagnosis present

## 2018-10-03 MED ORDER — DOXYCYCLINE HYCLATE 100 MG PO TABS
100.0000 mg | ORAL_TABLET | Freq: Once | ORAL | Status: AC
  Administered 2018-10-03: 100 mg via ORAL
  Filled 2018-10-03: qty 1

## 2018-10-03 MED ORDER — DOXYCYCLINE HYCLATE 100 MG PO CAPS: 100.0000 mg | ORAL_CAPSULE | Freq: Two times a day (BID) | ORAL | 0 refills | Status: AC

## 2018-10-03 NOTE — ED Provider Notes (Signed)
[facility] DEPT Provider Note   CSN: [id number] Arrival date & time: 10/03/18  1517     History   Chief Complaint Chief Complaint  Patient presents with  . Cough    HPI Benjamin [name] is a 73 y.o. male.  HPI  Benjamin [name] is a 73 y.o. male, with a history of prostate cancer, presenting to the ED with productive cough with yellow sputum for the past 2 days.  States his cough is consistent with a time when he was previously diagnosed with pneumonia.  He has been trying DayQuil without improvement.  Denies fever/chills, shortness of breath, chest pain, abdominal pain, N/V/D, or any other complaints.  Past Medical History:  Diagnosis Date  . Prostate cancer ([place]) dx'd 2010   surg only    There are no active problems to display for this patient.   Past Surgical History:  Procedure Laterality Date  . EYE SURGERY    . PROSTATE SURGERY          Home Medications    Prior to Admission medications   Medication Sig Start Date End Date Taking? Authorizing Provider  aspirin EC 81 MG tablet Take 81 mg by mouth daily.    Yes [provider]  fluticasone (FLONASE) 50 MCG/ACT nasal spray Place 1 spray into both nostrils daily. 08/08/17  Yes Neese, [place], NP  Menthol, Topical Analgesic, (ICY HOT BACK EX) Apply 1 application topically daily as needed (back pain.).   Yes [provider]  Multiple Vitamin (MULTIVITAMIN WITH MINERALS) TABS tablet Take 2 tablets by mouth daily.    Yes [provider]  OVER THE COUNTER MEDICATION Take 1 tablet by mouth daily as needed (seasonal allergies). [facility] allergy medication    Yes [provider]  Phenylephrine-DM-GG-APAP (VICKS DAYQUIL SEVERE COLD/FLU) 5-10-200-325 MG TABS Take 2 tablets by mouth 2 (two) times daily.   Yes [provider]  doxycycline (VIBRAMYCIN) 100 MG capsule Take 1 capsule (100 mg total) by mouth 2 (two) times daily for 7 days. 10/03/18 10/10/18   Lorayne Bender, PA-C    Family History No family history on file.  Social History Social History   Tobacco Use  . Smoking status: Former Research scientist (life sciences)  . Smokeless tobacco: Never Used  Substance Use Topics  . Alcohol use: No  . Drug use: No     Allergies   Patient has no known allergies.   Review of Systems Review of Systems  Constitutional: Negative for chills and fever.  Respiratory: Positive for cough. Negative for shortness of breath.   Cardiovascular: Negative for chest pain, palpitations and leg swelling.  Gastrointestinal: Negative for abdominal pain, diarrhea, nausea and vomiting.  Neurological: Negative for dizziness, syncope and light-headedness.  All other systems reviewed and are negative.    Physical Exam Updated Vital Signs BP 128/90 (BP Location: Left Arm)   Pulse 60   Temp 98.3 F (36.8 C) (Oral)   Resp 16   Ht 6\' 1"  (1.854 m)   Wt 108.9 kg   SpO2 94%   BMI 31.66 kg/m   Physical Exam Vitals signs and nursing note reviewed.  Constitutional:      General: He is not in acute distress.    Appearance: He is well-developed. He is not diaphoretic.  HENT:     Head: Normocephalic and atraumatic.     Mouth/Throat:     Mouth: Mucous membranes are moist.     Pharynx: Oropharynx is clear.  Eyes:  Conjunctiva/sclera: Conjunctivae normal.  Neck:     Musculoskeletal: Neck supple.  Cardiovascular:     Rate and Rhythm: Normal rate and regular rhythm.     Pulses: Normal pulses.     Heart sounds: Normal heart sounds.  Pulmonary:     Effort: Pulmonary effort is normal. No respiratory distress.     Breath sounds: Normal breath sounds.     Comments: No increased work of breathing.  Speaks in full sentences without difficulty. Abdominal:     Palpations: Abdomen is soft.     Tenderness: There is no abdominal tenderness. There is no guarding.  Musculoskeletal:     Right lower leg: No edema.     Left lower leg: No edema.  Lymphadenopathy:     Cervical: No  cervical adenopathy.  Skin:    General: Skin is warm and dry.  Neurological:     Mental Status: He is alert.  Psychiatric:        Mood and Affect: Mood and affect normal.        Speech: Speech normal.        Behavior: Behavior normal.      ED Treatments / Results  Labs (all labs ordered are listed, but only abnormal results are displayed) Labs Reviewed - No data to display  EKG None  Radiology Dg Chest 2 View  Result Date: 10/03/2018 CLINICAL DATA:  Sneezing for 3 days. EXAM: CHEST - 2 VIEW COMPARISON:  April 17, 2018 FINDINGS: The heart size and mediastinal contours are within normal limits. Mild Passy of medial left lung base is noted. There is no pleural effusion or pulmonary edema. The visualized skeletal structures are unremarkable. IMPRESSION: Mild opacity of medial left lung base is noted, developing pneumonia is not excluded. Electronically Signed   By: Abelardo Diesel M.D.   On: 10/03/2018 16:35    Procedures Procedures (including critical care time)  Medications Ordered in ED Medications  doxycycline (VIBRA-TABS) tablet 100 mg (100 mg Oral Given 10/03/18 2110)     Initial Impression / Assessment and Plan / ED Course  I have reviewed the triage vital signs and the nursing notes.  Pertinent labs & imaging results that were available during my care of the patient were reviewed by me and considered in my medical decision making (see chart for details).     Patient presents with cough. Patient is nontoxic appearing, afebrile, not tachycardic, not tachypneic, not hypotensive, maintains excellent SPO2 on room air, and is in no apparent distress.  Opacity suspicious for pneumonia noted on chest x-ray. The patient was given instructions for home care as well as return precautions. Patient voices understanding of these instructions, accepts the plan, and is comfortable with discharge.  Findings and plan of care discussed with Deno Etienne, DO.   Vitals:   10/03/18 1932  10/03/18 2008 10/03/18 2030 10/03/18 2056  BP:  129/80 126/77   Pulse: 60 61 (!) 59 65  Resp:  16    Temp:      TempSrc:      SpO2: 94% 98% 96% 98%  Weight:      Height:         Final Clinical Impressions(s) / ED Diagnoses   Final diagnoses:  Community acquired pneumonia of left lower lobe of lung [facility])    ED Discharge Orders         Ordered    doxycycline (VIBRAMYCIN) 100 MG capsule  2 times daily     10/03/18 2043  Lorayne Bender, PA-C 10/03/18 2124    Deno Etienne, DO 10/03/18 2255

## 2018-10-03 NOTE — ED Triage Notes (Signed)
Pt reports started sneezing 3 days ago and then cough that is productive started two days ago. Reports been taking Equate dayquil.

## 2018-10-03 NOTE — Discharge Instructions (Addendum)
There looks as if there may be the start of pneumonia on the chest x-ray.  Please take all of your antibiotics until finished!   You may develop abdominal discomfort or diarrhea from the antibiotic.  You may help offset this with probiotics which you can buy or get in yogurt. Do not eat or take the probiotics until 2 hours after your antibiotic.  Be sure to drink plenty of fluids to stay well-hydrated. Follow-up with your primary care provider should symptoms fail to resolve. Return to the ED for shortness of breath, chest pain, dizziness, or any other major concerns.

## 2018-10-15 ENCOUNTER — Emergency Department (HOSPITAL_COMMUNITY)
Admission: EM | Admit: 2018-10-15 | Discharge: 2018-10-15 | Disposition: A | Discharge status: Home / Self Care | Payer: Medicare Other | Attending: Emergency Medicine | Admitting: Emergency Medicine

## 2018-10-15 ENCOUNTER — Emergency Department (HOSPITAL_COMMUNITY): Payer: Medicare Other

## 2018-10-15 ENCOUNTER — Encounter (HOSPITAL_COMMUNITY): Payer: Self-pay | Admitting: Emergency Medicine

## 2018-10-15 ENCOUNTER — Other Ambulatory Visit: Payer: Self-pay

## 2018-10-15 DIAGNOSIS — Z7982 Long term (current) use of aspirin: Secondary | ICD-10-CM | POA: Diagnosis not present

## 2018-10-15 DIAGNOSIS — Z87891 Personal history of nicotine dependence: Secondary | ICD-10-CM | POA: Insufficient documentation

## 2018-10-15 DIAGNOSIS — R059 Cough, unspecified: Secondary | ICD-10-CM

## 2018-10-15 DIAGNOSIS — R05 Cough: Secondary | ICD-10-CM | POA: Diagnosis not present

## 2018-10-15 MED ORDER — BENZONATATE 100 MG PO CAPS: 100.0000 mg | ORAL_CAPSULE | Freq: Three times a day (TID) | ORAL | 0 refills | Status: DC

## 2018-10-15 NOTE — ED Triage Notes (Signed)
Pt c/o continued cough. Pt states he was seen here over a week ago and given meds, pt states his cough has returned. Here for f/u.

## 2018-10-15 NOTE — ED Notes (Signed)
Bed: WTR6 Expected date:  Expected time:  Means of arrival:  Comments: 

## 2018-10-15 NOTE — ED Provider Notes (Signed)
[facility] DEPT Provider Note   CSN: [id number] Arrival date & time: 10/15/18  7169    History   Chief Complaint Chief Complaint  Patient presents with  . Cough    HPI Benjamin [name] is a 73 y.o. male with hx of prostate cancer who presents to the ED for cough and congestion. Patient reports being evaluated here 10/03/18 and having early pneumonia. Patient treated out patient with doxycycline x 7 days. Patient reports he was feeling better until yesterday when he started back with cough and congestion. Patient reports no fever yet but wanted to be sure lungs were ok.      HPI  Past Medical History:  Diagnosis Date  . Prostate cancer ([place]) dx'd 2010   surg only    There are no active problems to display for this patient.   Past Surgical History:  Procedure Laterality Date  . EYE SURGERY    . PROSTATE SURGERY          Home Medications    Prior to Admission medications   Medication Sig Start Date End Date Taking? Authorizing Provider  aspirin EC 81 MG tablet Take 81 mg by mouth daily.     [provider]  benzonatate (TESSALON) 100 MG capsule Take 1 capsule (100 mg total) by mouth every 8 (eight) hours. 10/15/18   Ashley Murrain, NP  fluticasone (FLONASE) 50 MCG/ACT nasal spray Place 1 spray into both nostrils daily. 08/08/17   Ashley Murrain, NP  Menthol, Topical Analgesic, (ICY HOT BACK EX) Apply 1 application topically daily as needed (back pain.).    [provider]  Multiple Vitamin (MULTIVITAMIN WITH MINERALS) TABS tablet Take 2 tablets by mouth daily.     [provider]  OVER THE COUNTER MEDICATION Take 1 tablet by mouth daily as needed (seasonal allergies). [facility] allergy medication     [provider]  Phenylephrine-DM-GG-APAP (VICKS DAYQUIL SEVERE COLD/FLU) 5-10-200-325 MG TABS Take 2 tablets by mouth 2 (two) times daily.    [provider]    Family History No family history on  file.  Social History Social History   Tobacco Use  . Smoking status: Former Research scientist (life sciences)  . Smokeless tobacco: Never Used  Substance Use Topics  . Alcohol use: No  . Drug use: No     Allergies   Patient has no known allergies.   Review of Systems Review of Systems  Constitutional: Negative for chills and fever.  HENT: Positive for sinus pressure. Negative for ear pain and sore throat.   Eyes: Negative for discharge, redness and itching.  Respiratory: Positive for cough. Negative for shortness of breath.   Cardiovascular: Negative for chest pain.  Gastrointestinal: Negative for abdominal pain, diarrhea, nausea and vomiting.  Genitourinary: Negative for dysuria, frequency and urgency.  Musculoskeletal: Negative for back pain, myalgias and neck stiffness.  Skin: Negative for rash.  Neurological: Negative for headaches.  Psychiatric/Behavioral: Negative for confusion.     Physical Exam Updated Vital Signs BP 117/74 (BP Location: Right Arm)   Pulse 65   Temp 98.5 F (36.9 C) (Oral)   Resp 20   Ht 6' (1.829 m)   Wt 104.3 kg   SpO2 100%   BMI 31.19 kg/m   Physical Exam Vitals signs and nursing note reviewed.  Constitutional:      General: He is not in acute distress.    Appearance: He is well-developed.  HENT:     Head: Normocephalic.  Right Ear: Tympanic membrane normal.     Left Ear: Tympanic membrane normal.     Nose: Congestion present.     Mouth/Throat:     Mouth: Mucous membranes are moist.     Pharynx: Oropharynx is clear.  Eyes:     Extraocular Movements: Extraocular movements intact.     Conjunctiva/sclera: Conjunctivae normal.  Neck:     Musculoskeletal: Normal range of motion and neck supple. No neck rigidity.  Cardiovascular:     Rate and Rhythm: Normal rate and regular rhythm.  Pulmonary:     Effort: Pulmonary effort is normal. No respiratory distress.     Breath sounds: No wheezing or rales.  Musculoskeletal: Normal range of motion.  Skin:     General: Skin is warm and dry.  Neurological:     Mental Status: He is alert and oriented to person, place, and time.  Psychiatric:        Mood and Affect: Mood normal.      ED Treatments / Results  Labs (all labs ordered are listed, but only abnormal results are displayed) Labs Reviewed - No data to display  Radiology Dg Chest 2 View  Result Date: 10/15/2018 CLINICAL DATA:  Persistent cough. EXAM: CHEST - 2 VIEW COMPARISON:  10/03/2018 FINDINGS: Cardiomediastinal silhouette is normal. Mediastinal contours appear intact. There is no evidence of focal airspace consolidation, pleural effusion or pneumothorax. Minimal peribronchial opacities in the left lung base appear chronic. Osseous structures are without acute abnormality. Soft tissues are grossly normal. IMPRESSION: No active cardiopulmonary disease. Electronically Signed   By: Fidela Salisbury M.D.   On: 10/15/2018 13:23    Procedures Procedures (including critical care time)  Medications Ordered in ED Medications - No data to display   Initial Impression / Assessment and Plan / ED Course  I have reviewed the triage vital signs and the nursing notes.   73 y.o. male here with cough and no other symptoms. X-ray shows the pneumonia from previous visit cleared. Will give Rx for cough medication and patient to f/u with his PCP or return her for worsening symptoms.   Final Clinical Impressions(s) / ED Diagnoses   Final diagnoses:  Cough    ED Discharge Orders         Ordered    benzonatate (TESSALON) 100 MG capsule  Every 8 hours     10/15/18 1353           Janit Bern [place], [place] 10/15/18 2329    Carmin Muskrat, MD 10/19/18 1904

## 2018-10-15 NOTE — ED Notes (Signed)
Pt refused discharge vital signs

## 2018-10-15 NOTE — Discharge Instructions (Addendum)
Follow up with your doctor.  Return here as needed 

## 2018-12-28 ENCOUNTER — Encounter: Payer: Self-pay | Admitting: Gastroenterology

## 2019-01-25 ENCOUNTER — Other Ambulatory Visit: Payer: Self-pay | Admitting: Nurse Practitioner

## 2019-01-25 ENCOUNTER — Ambulatory Visit
Admission: RE | Admit: 2019-01-25 | Discharge: 2019-01-25 | Disposition: A | Discharge status: Home / Self Care | Payer: Medicare Other | Source: Ambulatory Visit | Attending: Nurse Practitioner | Admitting: Nurse Practitioner

## 2019-01-25 DIAGNOSIS — M25561 Pain in right knee: Secondary | ICD-10-CM

## 2019-03-29 DIAGNOSIS — H31011 Macula scars of posterior pole (postinflammatory) (post-traumatic), right eye: Secondary | ICD-10-CM | POA: Diagnosis not present

## 2019-03-29 DIAGNOSIS — Z961 Presence of intraocular lens: Secondary | ICD-10-CM | POA: Diagnosis not present

## 2019-10-01 DIAGNOSIS — E782 Mixed hyperlipidemia: Secondary | ICD-10-CM | POA: Diagnosis not present

## 2019-10-01 DIAGNOSIS — Z7189 Other specified counseling: Secondary | ICD-10-CM | POA: Diagnosis not present

## 2019-12-24 ENCOUNTER — Encounter: Payer: Self-pay | Admitting: Gastroenterology

## 2020-01-23 ENCOUNTER — Other Ambulatory Visit: Payer: Self-pay

## 2020-01-23 ENCOUNTER — Ambulatory Visit (AMBULATORY_SURGERY_CENTER): Payer: Self-pay | Admitting: *Deleted

## 2020-01-23 VITALS — Ht 72.0 in | Wt 235.0 lb

## 2020-01-23 DIAGNOSIS — Z86010 Personal history of colonic polyps: Secondary | ICD-10-CM

## 2020-01-23 NOTE — Progress Notes (Signed)

## 2020-02-05 ENCOUNTER — Encounter: Payer: Self-pay | Admitting: Certified Registered Nurse Anesthetist

## 2020-02-06 ENCOUNTER — Ambulatory Visit (AMBULATORY_SURGERY_CENTER): Payer: Medicare Other | Admitting: Gastroenterology

## 2020-02-06 ENCOUNTER — Encounter: Payer: Self-pay | Admitting: Gastroenterology

## 2020-02-06 ENCOUNTER — Other Ambulatory Visit: Payer: Self-pay

## 2020-02-06 VITALS — BP 114/78 | HR 57 | Temp 96.6°F | Resp 11 | Ht 72.0 in | Wt 235.0 lb

## 2020-02-06 DIAGNOSIS — D123 Benign neoplasm of transverse colon: Secondary | ICD-10-CM

## 2020-02-06 DIAGNOSIS — K635 Polyp of colon: Secondary | ICD-10-CM | POA: Diagnosis not present

## 2020-02-06 DIAGNOSIS — Z86010 Personal history of colonic polyps: Secondary | ICD-10-CM | POA: Diagnosis not present

## 2020-02-06 MED ORDER — SODIUM CHLORIDE 0.9 % IV SOLN: 500.0000 mL | Freq: Once | INTRAVENOUS | Status: DC

## 2020-02-06 NOTE — Progress Notes (Signed)
Called to room to assist during endoscopic procedure.  Patient ID and intended procedure confirmed with present staff. Received instructions for my participation in the procedure from the performing physician.  

## 2020-02-06 NOTE — Progress Notes (Signed)
Pt's states no medical or surgical changes since previsit or office visit. 

## 2020-02-06 NOTE — Op Note (Signed)
[facility] Patient Name: Benjamin [name] Procedure Date: 02/06/2020 9:46 AM MRN: [id number] Endoscopist: Mallie Mussel L. Loletha Carrow , MD Age: 74 Referring MD:  Date of Birth: 1946/02/09 Gender: Male Account #: [id number] Procedure:                Colonoscopy Indications:              Surveillance: Personal history of adenomatous                            polyps on last colonoscopy > 3 years ago (TA x 4 ,                            < 19mm, 12/2015) Medicines:                Monitored Anesthesia Care Procedure:                Pre-Anesthesia Assessment:                           - Prior to the procedure, a History and Physical                            was performed, and patient medications and                            allergies were reviewed. The patient's tolerance of                            previous anesthesia was also reviewed. The risks                            and benefits of the procedure and the sedation                            options and risks were discussed with the patient.                            All questions were answered, and informed consent                            was obtained. Prior Anticoagulants: The patient has                            taken no previous anticoagulant or antiplatelet                            agents except for aspirin. ASA Grade Assessment: II                            - A patient with mild systemic disease. After                            reviewing the risks and benefits, the patient was  deemed in satisfactory condition to undergo the                            procedure.                           After obtaining informed consent, the colonoscope                            was passed under direct vision. Throughout the                            procedure, the patient's blood pressure, pulse, and                            oxygen saturations were monitored continuously. The                             Colonoscope was introduced through the anus and                            advanced to the the cecum, identified by                            appendiceal orifice and ileocecal valve. The                            colonoscopy was somewhat difficult due to a                            redundant colon. Successful completion of the                            procedure was aided by using manual pressure. The                            patient tolerated the procedure well. The quality                            of the bowel preparation was good after lavage. The                            ileocecal valve, appendiceal orifice, and rectum                            were photographed. Scope In: 9:58:08 AM Scope Out: 10:18:50 AM Scope Withdrawal Time: 0 hours 12 minutes 43 seconds  Total Procedure Duration: 0 hours 20 minutes 42 seconds  Findings:                 The perianal and digital rectal examinations were                            normal.  A 4 mm polyp was found in the transverse colon. The                            polyp was sessile. The polyp was removed with a                            cold snare. Resection and retrieval were complete.                           The exam was otherwise without abnormality on                            direct and retroflexion views. Complications:            No immediate complications. Estimated Blood Loss:     Estimated blood loss was minimal. Impression:               - One 4 mm polyp in the transverse colon, removed                            with a cold snare. Resected and retrieved.                           - The examination was otherwise normal on direct                            and retroflexion views. Recommendation:           - Patient has a contact number available for                            emergencies. The signs and symptoms of potential                            delayed complications were discussed with the                             patient. Return to normal activities tomorrow.                            Written discharge instructions were provided to the                            patient.                           - Resume previous diet.                           - Continue present medications.                           - Await pathology results.                           - Based on current guidelines, no repeat  screening/surveillance colonoscopy recommended. [name] L. Loletha Carrow, MD 02/06/2020 10:23:32 AM This report has been signed electronically.

## 2020-02-06 NOTE — Patient Instructions (Signed)
Please read handouts provided. Continue present medications. Await pathology results.   YOU HAD AN ENDOSCOPIC PROCEDURE TODAY AT THE  ENDOSCOPY CENTER:   Refer to the procedure report that was given to you for any specific questions about what was found during the examination.  If the procedure report does not answer your questions, please call your gastroenterologist to clarify.  If you requested that your care partner not be given the details of your procedure findings, then the procedure report has been included in a sealed envelope for you to review at your convenience later.  YOU SHOULD EXPECT: Some feelings of bloating in the abdomen. Passage of more gas than usual.  Walking can help get rid of the air that was put into your GI tract during the procedure and reduce the bloating. If you had a lower endoscopy (such as a colonoscopy or flexible sigmoidoscopy) you may notice spotting of blood in your stool or on the toilet paper. If you underwent a bowel prep for your procedure, you may not have a normal bowel movement for a few days.  Please Note:  You might notice some irritation and congestion in your nose or some drainage.  This is from the oxygen used during your procedure.  There is no need for concern and it should clear up in a day or so.  SYMPTOMS TO REPORT IMMEDIATELY:  Following lower endoscopy (colonoscopy or flexible sigmoidoscopy):  Excessive amounts of blood in the stool  Significant tenderness or worsening of abdominal pains  Swelling of the abdomen that is new, acute  Fever of 100F or higher   For urgent or emergent issues, a gastroenterologist can be reached at any hour by calling [phone]. Do not use MyChart messaging for urgent concerns.    DIET:  We do recommend a small meal at first, but then you may proceed to your regular diet.  Drink plenty of fluids but you should avoid alcoholic beverages for 24 hours.  ACTIVITY:  You should plan to take it easy  for the rest of today and you should NOT DRIVE or use heavy machinery until tomorrow (because of the sedation medicines used during the test).    FOLLOW UP: Our staff will call the number listed on your records 48-72 hours following your procedure to check on you and address any questions or concerns that you may have regarding the information given to you following your procedure. If we do not reach you, we will leave a message.  We will attempt to reach you two times.  During this call, we will ask if you have developed any symptoms of COVID 19. If you develop any symptoms (ie: fever, flu-like symptoms, shortness of breath, cough etc.) before then, please call [phone].  If you test positive for Covid 19 in the 2 weeks post procedure, please call and report this information to us.    If any biopsies were taken you will be contacted by phone or by letter within the next 1-3 weeks.  Please call us at [phone] if you have not heard about the biopsies in 3 weeks.    SIGNATURES/CONFIDENTIALITY: You and/or your care partner have signed paperwork which will be entered into your electronic medical record.  These signatures attest to the fact that that the information above on your After Visit Summary has been reviewed and is understood.  Full responsibility of the confidentiality of this discharge information lies with you and/or your care-partner.  

## 2020-02-06 NOTE — Progress Notes (Signed)
Report given to PACU, vss 

## 2020-02-08 ENCOUNTER — Telehealth: Payer: Self-pay

## 2020-02-08 NOTE — Telephone Encounter (Signed)
  Follow up Call-  Call back number 02/06/2020  Post procedure Call Back phone  # [phone]  Permission to leave phone message Yes  Some recent data might be hidden     Patient questions:  Do you have a fever, pain , or abdominal swelling? No. Pain Score  0 *  Have you tolerated food without any problems? Yes.    Have you been able to return to your normal activities? Yes.    Do you have any questions about your discharge instructions: Diet   No. Medications  No. Follow up visit  No.  Do you have questions or concerns about your Care? No.  Actions: * If pain score is 4 or above: No action needed, pain <4.   1. Have you developed a fever since your procedure? no  2.   Have you had an respiratory symptoms (SOB or cough) since your procedure? no  3.   Have you tested positive for COVID 19 since your procedure no  4.   Have you had any family members/close contacts diagnosed with the COVID 19 since your procedure?  no   If yes to any of these questions please route to Joylene John, RN and Erenest Rasher, RN

## 2020-02-11 ENCOUNTER — Encounter: Payer: Self-pay | Admitting: Gastroenterology

## 2020-04-03 DIAGNOSIS — H31011 Macula scars of posterior pole (postinflammatory) (post-traumatic), right eye: Secondary | ICD-10-CM | POA: Diagnosis not present

## 2020-04-03 DIAGNOSIS — H02132 Senile ectropion of right lower eyelid: Secondary | ICD-10-CM | POA: Diagnosis not present

## 2020-04-03 DIAGNOSIS — Z961 Presence of intraocular lens: Secondary | ICD-10-CM | POA: Diagnosis not present

## 2020-04-10 ENCOUNTER — Encounter (HOSPITAL_COMMUNITY): Payer: Self-pay | Admitting: Emergency Medicine

## 2020-04-10 ENCOUNTER — Other Ambulatory Visit: Payer: Self-pay

## 2020-04-10 ENCOUNTER — Emergency Department (HOSPITAL_COMMUNITY)
Admission: EM | Admit: 2020-04-10 | Discharge: 2020-04-10 | Disposition: A | Discharge status: Home / Self Care | Payer: Medicare Other | Attending: Emergency Medicine | Admitting: Emergency Medicine

## 2020-04-10 DIAGNOSIS — H6122 Impacted cerumen, left ear: Secondary | ICD-10-CM | POA: Insufficient documentation

## 2020-04-10 DIAGNOSIS — C61 Malignant neoplasm of prostate: Secondary | ICD-10-CM | POA: Insufficient documentation

## 2020-04-10 DIAGNOSIS — R42 Dizziness and giddiness: Secondary | ICD-10-CM | POA: Insufficient documentation

## 2020-04-10 DIAGNOSIS — Z7982 Long term (current) use of aspirin: Secondary | ICD-10-CM | POA: Insufficient documentation

## 2020-04-10 DIAGNOSIS — R9431 Abnormal electrocardiogram [ECG] [EKG]: Secondary | ICD-10-CM | POA: Diagnosis not present

## 2020-04-10 LAB — BASIC METABOLIC PANEL
Anion gap: 5 (ref 5–15)
BUN: 13 mg/dL (ref 8–23)
CO2: 30 mmol/L (ref 22–32)
Calcium: 9.1 mg/dL (ref 8.9–10.3)
Chloride: 104 mmol/L (ref 98–111)
Creatinine, Ser: 0.99 mg/dL (ref 0.61–1.24)
GFR calc Af Amer: >60 mL/min (ref >60)
GFR calc non Af Amer: >60 mL/min (ref >60)
Glucose, Bld: 94 mg/dL (ref 70–99)
Potassium: 4 mmol/L (ref 3.5–5.1)
Sodium: 139 mmol/L (ref 135–145)

## 2020-04-10 LAB — CBC
HCT: 41.1 % (ref 39.0–52.0)
Hemoglobin: 12.8 g/dL — ABNORMAL LOW (ref 13.0–17.0)
MCH: 29.3 pg (ref 26.0–34.0)
MCHC: 31.1 g/dL (ref 30.0–36.0)
MCV: 94.1 fL (ref 80.0–100.0)
Platelets: 197 10*3/uL (ref 150–400)
RBC: 4.37 MIL/uL (ref 4.22–5.81)
RDW: 13.2 % (ref 11.5–15.5)
WBC: 4.1 10*3/uL (ref 4.0–10.5)
nRBC: 0 % (ref 0.0–0.2)

## 2020-04-10 LAB — URINALYSIS, ROUTINE W REFLEX MICROSCOPIC
Bilirubin Urine: NEGATIVE
Glucose, UA: NEGATIVE mg/dL
Hgb urine dipstick: NEGATIVE
Ketones, ur: NEGATIVE mg/dL
Leukocytes,Ua: NEGATIVE
Nitrite: NEGATIVE
Protein, ur: NEGATIVE mg/dL
Specific Gravity, Urine: 1.016 (ref 1.005–1.030)
pH: 6 (ref 5.0–8.0)

## 2020-04-10 LAB — CBG MONITORING, ED: Glucose-Capillary: 83 mg/dL (ref 70–99)

## 2020-04-10 NOTE — Discharge Instructions (Addendum)
Please schedule follow-up appoint with your primary doctor for close recheck within the next few days.  If you develop difficulty breathing, chest pain, worsening dizziness, numbness, weakness or other new concerning symptom, please return to ER for reassessment.

## 2020-04-10 NOTE — ED Provider Notes (Signed)
[facility] DEPT Provider Note   CSN: [id number] Arrival date & time: 04/10/20  0747     History Chief Complaint  Patient presents with  . Dizziness    Benjamin [name] is a 74 y.o. male.  Presents to ER with concern for dizziness.  Patient reports on Monday he felt somewhat dizzy, seem to be worse Tuesday but then improved on Wednesday and today he has had no dizziness.  Seem to come and go gradually, not sudden onset, no associated headaches, no associated chest pain, difficulty breathing.  Described as lightheaded sensation, no passing out or near passing out.  No room spinning sensation.  No ear pain, recent illnesses.  Currently has no symptoms.  HPI     Past Medical History:  Diagnosis Date  . Allergy   . Arthritis    back  . Prostate cancer ([place]) dx'd 2010   surg only    There are no problems to display for this patient.   Past Surgical History:  Procedure Laterality Date  . COLONOSCOPY    . EYE SURGERY    . PROSTATE SURGERY         Family History  Problem Relation Age of Onset  . Colon cancer Neg Hx   . Colon polyps Neg Hx   . Stomach cancer Neg Hx   . Esophageal cancer Neg Hx     Social History   Tobacco Use  . Smoking status: Former Smoker    Types: Cigarettes  . Smokeless tobacco: Never Used  Vaping Use  . Vaping Use: Never used  Substance Use Topics  . Alcohol use: No  . Drug use: No    Home Medications Prior to Admission medications   Medication Sig Start Date End Date Taking? Authorizing Provider  aspirin EC 81 MG tablet Take 81 mg by mouth daily.     [provider]  benzonatate (TESSALON) 100 MG capsule Take 1 capsule (100 mg total) by mouth every 8 (eight) hours. 10/15/18   Ashley Murrain, NP  fluticasone (FLONASE) 50 MCG/ACT nasal spray Place 1 spray into both nostrils daily. 08/08/17   Ashley Murrain, NP  Menthol, Topical Analgesic, (ICY HOT BACK EX) Apply 1 application topically daily as needed  (back pain.).    [provider]  Multiple Vitamin (MULTIVITAMIN WITH MINERALS) TABS tablet Take 2 tablets by mouth daily.     [provider]  OVER THE COUNTER MEDICATION Take 1 tablet by mouth daily as needed (seasonal allergies). [facility] allergy medication     [provider]  Phenylephrine-DM-GG-APAP (VICKS DAYQUIL SEVERE COLD/FLU) 5-10-200-325 MG TABS Take 2 tablets by mouth 2 (two) times daily.    [provider]    Allergies    Patient has no known allergies.  Review of Systems   Review of Systems  Constitutional: Negative for chills and fever.  HENT: Negative for ear pain and sore throat.   Eyes: Negative for pain and visual disturbance.  Respiratory: Negative for cough and shortness of breath.   Cardiovascular: Negative for chest pain and palpitations.  Gastrointestinal: Negative for abdominal pain and vomiting.  Genitourinary: Negative for dysuria and hematuria.  Musculoskeletal: Negative for arthralgias and back pain.  Skin: Negative for color change and rash.  Neurological: Positive for dizziness. Negative for seizures and syncope.  All other systems reviewed and are negative.   Physical Exam Updated Vital Signs BP 110/72 (BP Location: Right Arm)   Pulse 62   Temp 97.9 F (  36.6 C) (Oral)   Resp 18   Ht 6' (1.829 m)   Wt 63.5 kg   SpO2 100%   BMI 18.99 kg/m   Physical Exam Vitals and nursing note reviewed.  Constitutional:      Appearance: He is well-developed.  HENT:     Head: Normocephalic and atraumatic.     Right Ear: External ear normal. There is impacted cerumen.     Left Ear: Tympanic membrane and ear canal normal.  Eyes:     Conjunctiva/sclera: Conjunctivae normal.  Cardiovascular:     Rate and Rhythm: Normal rate and regular rhythm.     Heart sounds: No murmur heard.   Pulmonary:     Effort: Pulmonary effort is normal. No respiratory distress.     Breath sounds: Normal breath sounds.  Abdominal:      Palpations: Abdomen is soft.     Tenderness: There is no abdominal tenderness.  Musculoskeletal:        General: No deformity or signs of injury.     Cervical back: Neck supple.  Skin:    General: Skin is warm and dry.  Neurological:     General: No focal deficit present.     Mental Status: He is alert.     Comments: AAOx3 CN 2-12 intact, speech clear visual fields intact 5/5 strength in b/l UE and LE Sensation to light touch intact in b/l UE and LE Normal FNF Normal gait  Psychiatric:        Mood and Affect: Mood normal.        Behavior: Behavior normal.     ED Results / Procedures / Treatments   Labs (all labs ordered are listed, but only abnormal results are displayed) Labs Reviewed  CBC - Abnormal; Notable for the following components:      Result Value   Hemoglobin 12.8 (*)    All other components within normal limits  BASIC METABOLIC PANEL  URINALYSIS, ROUTINE W REFLEX MICROSCOPIC  CBG MONITORING, ED    EKG EKG Interpretation  Date/Time:  Thursday April 10 2020 08:27:37 EDT Ventricular Rate:  57 PR Interval:    QRS Duration: 96 QT Interval:  421 QTC Calculation: 410 R Axis:   12 Text Interpretation: Sinus rhythm RSR' in V1 or V2, right VCD or RVH 12 Lead; Mason-Likar No significant change since last tracing Confirmed by Lacretia Leigh ([id number]) on 04/10/2020 9:24:17 AM   Radiology No results found.  Procedures Procedures (including critical care time)  Medications Ordered in ED Medications - No data to display  ED Course  I have reviewed the triage vital signs and the nursing notes.  Pertinent labs & imaging results that were available during my care of the patient were reviewed by me and considered in my medical decision making (see chart for details).    MDM Rules/Calculators/A&P                          74 year old male presents to ER with concern for episodes of dizziness.  On exam, patient is currently well-appearing with stable vital signs.   He has no ongoing symptoms, has normal neurologic exam.  Basic labs reports with a normal limits, EKG without acute ischemic changes.  CXR negative.  Unclear exact cause of his symptoms, however given he had spontaneous resolution of symptoms, no ongoing symptoms, no neurologic deficits and no reported neurologic symptoms, do not feel he needs any additional imaging or observation today.  Recommend  he have close follow-up with his primary doctor, discharged home.    After the discussed management above, the patient was determined to be safe for discharge.  The patient was in agreement with this plan and all questions regarding their care were answered.  ED return precautions were discussed and the patient will return to the ED with any significant worsening of condition.   Final Clinical Impression(s) / ED Diagnoses Final diagnoses:  Dizziness    Rx / DC Orders ED Discharge Orders    None       Lucrezia Starch, MD 04/10/20 2039

## 2020-04-10 NOTE — ED Triage Notes (Signed)
Per pt, states he has been dizzy for 3-4 days-has not passed out-no other symptoms, no medical history

## 2020-09-23 DIAGNOSIS — Z7189 Other specified counseling: Secondary | ICD-10-CM | POA: Diagnosis not present

## 2020-09-23 DIAGNOSIS — E782 Mixed hyperlipidemia: Secondary | ICD-10-CM | POA: Diagnosis not present

## 2020-09-23 DIAGNOSIS — Z0000 Encounter for general adult medical examination without abnormal findings: Secondary | ICD-10-CM | POA: Diagnosis not present

## 2021-07-03 ENCOUNTER — Emergency Department (HOSPITAL_COMMUNITY)
Admission: EM | Admit: 2021-07-03 | Discharge: 2021-07-04 | Disposition: A | Discharge status: Home / Self Care | Payer: Medicare Other | Attending: Emergency Medicine | Admitting: Emergency Medicine

## 2021-07-03 ENCOUNTER — Encounter (HOSPITAL_COMMUNITY): Payer: Self-pay | Admitting: Emergency Medicine

## 2021-07-03 ENCOUNTER — Other Ambulatory Visit: Payer: Self-pay

## 2021-07-03 ENCOUNTER — Emergency Department (HOSPITAL_COMMUNITY): Payer: Medicare Other

## 2021-07-03 DIAGNOSIS — Z7982 Long term (current) use of aspirin: Secondary | ICD-10-CM | POA: Insufficient documentation

## 2021-07-03 DIAGNOSIS — Z87891 Personal history of nicotine dependence: Secondary | ICD-10-CM | POA: Diagnosis not present

## 2021-07-03 DIAGNOSIS — Z8546 Personal history of malignant neoplasm of prostate: Secondary | ICD-10-CM | POA: Diagnosis not present

## 2021-07-03 DIAGNOSIS — M25561 Pain in right knee: Secondary | ICD-10-CM | POA: Diagnosis not present

## 2021-07-03 DIAGNOSIS — M1711 Unilateral primary osteoarthritis, right knee: Secondary | ICD-10-CM | POA: Diagnosis not present

## 2021-07-03 DIAGNOSIS — M25461 Effusion, right knee: Secondary | ICD-10-CM | POA: Diagnosis not present

## 2021-07-03 NOTE — ED Provider Notes (Signed)
Emergency Medicine Provider Triage Evaluation Note  Benjamin [name] , a 75 y.o. male  was evaluated in triage.  Pt complains of gradual onset, constant, achy, right knee pain and swelling for the past 2 days.  Patient reports history of intermittent knee pain since he was in college.  He states he used to have fluid drained from his knee every once in a while.  He states 2 years ago he was evaluated for knee pain and told that he had "particles "floating around in his knee space.  He was provided a brace and after 2 to 3 days this seemed to help.  He states that 2 days ago he began having similar pain and swelling.  He is here for further evaluation and in hopes that someone will draw the fluid off of his knee.  Denies trauma to the knee.  No fevers, chills, redness.  Review of Systems  Positive: + knee pain Negative: - fevers, chills, redness  Physical Exam  BP 132/78 (BP Location: Right Arm)   Pulse 62   Temp 98.3 F (36.8 C) (Oral)   Resp 18   SpO2 100%  Gen:   Awake, no distress   Resp:  Normal effort  MSK:   Moves extremities without difficulty  Other:  + swelling and TTP to R Knee. ROM limited s/2 pain. Strength and sensation intact. No erythema or increased warmth to the joint. 2+ PT Pulse.   Medical Decision Making  Medically screening exam initiated at 7:53 PM.  Appropriate orders placed.  [name] was informed that the remainder of the evaluation will be completed by another provider, this initial triage assessment does not replace that evaluation, and the importance of remaining in the ED until their evaluation is complete.     Eustaquio Maize, PA-C 07/03/21 1955    Regan Lemming, MD 07/03/21 2008

## 2021-07-03 NOTE — ED Triage Notes (Signed)
Pt c/o right knee pain and swelling x 2 days.

## 2021-07-04 DIAGNOSIS — M25561 Pain in right knee: Secondary | ICD-10-CM | POA: Diagnosis not present

## 2021-07-04 DIAGNOSIS — S8391XA Sprain of unspecified site of right knee, initial encounter: Secondary | ICD-10-CM | POA: Diagnosis not present

## 2021-07-04 MED ORDER — DICLOFENAC SODIUM 1 % EX GEL: 2.0000 g | Freq: Two times a day (BID) | CUTANEOUS | 0 refills | Status: DC

## 2021-07-04 NOTE — ED Provider Notes (Signed)
[facility] EMERGENCY DEPARTMENT Provider Note   CSN: [id number] Arrival date & time: 07/03/21  1924     History Chief Complaint  Patient presents with   Knee Pain    Benjamin [name] is a 75 y.o. male.  Patient presents to the emergency department with a chief complaint of right-sided knee pain.  He states that symptoms started a couple days ago.  He states that this happens every couple of years.  States that he has seen orthopedics, and they gave him a brace.  He denies any fever.  Denies any new injury.  Denies any redness of the knee.  Denies any history of gout.  States he has tried taking Epson salt baths and using heat with some relief.  The history is provided by the patient. No language interpreter was used.      Past Medical History:  Diagnosis Date   Allergy    Arthritis    back   Prostate cancer ([place]) dx'd 2010   surg only    There are no problems to display for this patient.   Past Surgical History:  Procedure Laterality Date   COLONOSCOPY     EYE SURGERY     PROSTATE SURGERY         Family History  Problem Relation Age of Onset   Colon cancer Neg Hx    Colon polyps Neg Hx    Stomach cancer Neg Hx    Esophageal cancer Neg Hx     Social History   Tobacco Use   Smoking status: Former    Types: Cigarettes   Smokeless tobacco: Never  Vaping Use   Vaping Use: Never used  Substance Use Topics   Alcohol use: No   Drug use: No    Home Medications Prior to Admission medications   Medication Sig Start Date End Date Taking? Authorizing Provider  diclofenac Sodium (VOLTAREN) 1 % GEL Apply 2 g topically in the morning and at bedtime. 07/04/21  Yes Montine Circle, PA-C  aspirin EC 81 MG tablet Take 81 mg by mouth daily.     [provider]  benzonatate (TESSALON) 100 MG capsule Take 1 capsule (100 mg total) by mouth every 8 (eight) hours. 10/15/18   Ashley Murrain, NP  fluticasone (FLONASE) 50 MCG/ACT nasal spray Place 1  spray into both nostrils daily. 08/08/17   Ashley Murrain, NP  Menthol, Topical Analgesic, (ICY HOT BACK EX) Apply 1 application topically daily as needed (back pain.).    [provider]  Multiple Vitamin (MULTIVITAMIN WITH MINERALS) TABS tablet Take 2 tablets by mouth daily.     [provider]  OVER THE COUNTER MEDICATION Take 1 tablet by mouth daily as needed (seasonal allergies). [facility] allergy medication     [provider]  Phenylephrine-DM-GG-APAP (VICKS DAYQUIL SEVERE COLD/FLU) 5-10-200-325 MG TABS Take 2 tablets by mouth 2 (two) times daily.    [provider]    Allergies    Patient has no known allergies.  Review of Systems   Review of Systems  All other systems reviewed and are negative.  Physical Exam Updated Vital Signs BP 122/78 (BP Location: Left Arm)   Pulse (!) 56   Temp 97.9 F (36.6 C) (Oral)   Resp 16   SpO2 100%   Physical Exam Nursing note and vitals reviewed.  Constitutional: Pt appears well-developed and well-nourished. No distress.  HENT:  Head: Normocephalic and atraumatic.  Eyes: Conjunctivae are normal.  Neck: Normal  range of motion.  Cardiovascular: Normal rate, regular rhythm. Intact distal pulses.   Capillary refill < 3 sec.  Pulmonary/Chest: Effort normal and breath sounds normal.  Right knee Pt exhibits mild swelling, no bony deformity.   ROM: 4/5 limited by pain Strength: 4/5 Neurological: Pt  is alert. Coordination normal.  Sensation: 5/5 Skin: Skin is warm and dry. Pt is not diaphoretic.  No evidence of open wound or skin tenting Psychiatric: Pt has a normal mood and affect.   ED Results / Procedures / Treatments   Labs (all labs ordered are listed, but only abnormal results are displayed) Labs Reviewed - No data to display  EKG None  Radiology DG Knee Complete 4 Views Right  Result Date: 07/03/2021 CLINICAL DATA:  Knee pain. EXAM: RIGHT KNEE - COMPLETE 4+ VIEW COMPARISON:  03/01/2019.  FINDINGS: There is diffusely decreased mineralization of the bones. No acute fracture or dislocation is seen. There is moderate tricompartmental degenerative changes, most pronounced at the patellofemoral joint. There is a small suprapatellar joint effusion. IMPRESSION: 1. No acute fracture or dislocation. 2. Moderate degenerative changes. 3. Small suprapatellar joint effusion. Electronically Signed   By: Brett Fairy M.D.   On: 07/03/2021 21:02    Procedures Procedures   Medications Ordered in ED Medications - No data to display  ED Course  I have reviewed the triage vital signs and the nursing notes.  Pertinent labs & imaging results that were available during my care of the patient were reviewed by me and considered in my medical decision making (see chart for details).    MDM Rules/Calculators/A&P                           Patient here with right knee pain.  This is acute on chronic.  We will give patient knee immobilizer and will trial Voltaren gel.  Recommend orthopedic follow-up.  I have low suspicion for septic joint, there is no erythema, he does not febrile, he is able to bend his knee.  There is a small suprapatellar joint effusion seen on x-ray, likely inflammatory.  Doubt gout, patient does not have any history of this. Final Clinical Impression(s) / ED Diagnoses Final diagnoses:  None    Rx / DC Orders ED Discharge Orders          Ordered    diclofenac Sodium (VOLTAREN) 1 % GEL  2 times daily        07/04/21 0342             Montine Circle, PA-C 07/04/21 0345    Veryl Speak, MD 07/04/21 [phone]

## 2021-11-15 ENCOUNTER — Emergency Department: Payer: Medicare Other

## 2021-11-15 ENCOUNTER — Encounter: Payer: Self-pay | Admitting: Emergency Medicine

## 2021-11-15 ENCOUNTER — Other Ambulatory Visit: Payer: Self-pay

## 2021-11-15 ENCOUNTER — Emergency Department
Admission: EM | Admit: 2021-11-15 | Discharge: 2021-11-15 | Disposition: A | Discharge status: Home / Self Care | Payer: Medicare Other | Attending: Emergency Medicine | Admitting: Emergency Medicine

## 2021-11-15 DIAGNOSIS — M5432 Sciatica, left side: Secondary | ICD-10-CM | POA: Insufficient documentation

## 2021-11-15 DIAGNOSIS — M25552 Pain in left hip: Secondary | ICD-10-CM | POA: Diagnosis not present

## 2021-11-15 DIAGNOSIS — M545 Low back pain, unspecified: Secondary | ICD-10-CM | POA: Diagnosis not present

## 2021-11-15 DIAGNOSIS — Z8546 Personal history of malignant neoplasm of prostate: Secondary | ICD-10-CM | POA: Insufficient documentation

## 2021-11-15 DIAGNOSIS — M5442 Lumbago with sciatica, left side: Secondary | ICD-10-CM | POA: Diagnosis not present

## 2021-11-15 MED ORDER — LIDOCAINE 5 % EX PTCH: 1.0000 | MEDICATED_PATCH | CUTANEOUS | 11 refills | Status: AC

## 2021-11-15 MED ORDER — KETOROLAC TROMETHAMINE 15 MG/ML IJ SOLN
15.0000 mg | Freq: Once | INTRAMUSCULAR | Status: AC
  Administered 2021-11-15: 15 mg via INTRAMUSCULAR
  Filled 2021-11-15: qty 1

## 2021-11-15 MED ORDER — METHOCARBAMOL 500 MG PO TABS: 500.0000 mg | ORAL_TABLET | Freq: Three times a day (TID) | ORAL | 0 refills | Status: AC

## 2021-11-15 NOTE — ED Provider Notes (Signed)
? ?[facility] ?Provider Note ? ? ? Event Date/Time  ? First MD Initiated Contact with Patient 11/15/21 1914   ?  (approximate) ? ? ?History  ? ?Chief Complaint ?Hip Pain ? ? ?HPI ?Benjamin [name] is a 76 y.o. male, prostate cancer, arthritis, presents to the emergency department for evaluation of left-sided hip pain.  Patient states that the pain has been going on for the past 3 days, with radiation down into his left thigh.  Patient states hurts most when walking.  He is still able to walk with a cane.  Patient states that he has had similar pain in the past.  He has been taken Tylenol with minimal relief.  Denies fever/chills, chest pain, shortness of breath, numbness/tingling in upper or lower extremities, bowel/bladder dysfunction, abdominal pain, back pain, urinary symptoms, headache, or rashes. ? ?History Limitations: No limitations. ? ?  ? ? ?Physical Exam  ?Triage Vital Signs: ?ED Triage Vitals [11/15/21 1909]  ?Enc Vitals Group  ?   BP 139/74  ?   Pulse Rate 60  ?   Resp 18  ?   Temp 98.4 ?F (36.9 ?C)  ?   Temp Source Oral  ?   SpO2 99 %  ?   Weight 230 lb (104.3 kg)  ?   Height '5\' 10"'$  (1.778 m)  ?   Head Circumference   ?   Peak Flow   ?   Pain Score 10  ?   Pain Loc   ?   Pain Edu?   ?   Excl. in [place]?   ? ? ?Most recent vital signs: ?Vitals:  ? 11/15/21 1909  ?BP: 139/74  ?Pulse: 60  ?Resp: 18  ?Temp: 98.4 ?F (36.9 ?C)  ?SpO2: 99%  ? ? ?General: Awake, NAD.  ?Skin: Warm, dry.  ?CV: Good peripheral perfusion.  ?Resp: Normal effort.  ?Abd: Soft, non-tender. No distention.  ?Neuro: At baseline. No gross neurological deficits.  ?Other: No gross deformities appreciated along the left lower extremity.  Positive straight leg test.  No midline spinal tenderness in the lumbar region.  Pulse, motor, sensation intact distally.  No tenderness when palpating the thigh, knee, or tibia/fibula. ? ?Physical Exam ? ? ? ?ED Results / Procedures / Treatments  ?Labs ?(all labs ordered are listed, but  only abnormal results are displayed) ?Labs Reviewed - No data to display ? ? ?EKG ?Not applicable. ? ? ?RADIOLOGY ? ?ED Provider Interpretation: I personally reviewed these x-rays, no evidence of acute fracture on lumbar spine x-ray or DG hip x-ray. ? ?DG Lumbar Spine Complete ? ?Result Date: 11/15/2021 ?CLINICAL DATA:  Lower back and left hip pain. EXAM: LUMBAR SPINE - COMPLETE 4+ VIEW COMPARISON:  None. FINDINGS: There is no evidence of an acute lumbar spine fracture. Alignment is normal. Marked severity multilevel endplate sclerosis and anterior osteophyte formation is seen throughout the lumbar spine. Mild multilevel intervertebral disc space narrowing is seen throughout the lumbar spine. IMPRESSION: Marked severity multilevel degenerative changes, without evidence of acute osseous abnormality. Electronically Signed   By: Virgina Norfolk M.D.   On: 11/15/2021 20:26  ? ?DG Hip Unilat With Pelvis 2-3 Views Left ? ?Result Date: 11/15/2021 ?CLINICAL DATA:  Lower back and left hip pain. EXAM: DG HIP (WITH OR WITHOUT PELVIS) 2-3V LEFT COMPARISON:  None. FINDINGS: There is no evidence of an acute hip fracture or dislocation. Moderate severity degenerative changes are seen involving both hips, in the form of joint space narrowing and acetabular  sclerosis. Additional degenerative changes are seen within the visualized portion of the lower lumbar spine. IMPRESSION: Moderate severity degenerative changes, without an acute osseous abnormality. Electronically Signed   By: Virgina Norfolk M.D.   On: 11/15/2021 20:27   ? ?PROCEDURES: ? ?Critical Care performed: None. ? ?Procedures ? ? ? ?MEDICATIONS ORDERED IN ED: ?Medications  ?ketorolac (TORADOL) 15 MG/ML injection 15 mg (15 mg Intramuscular Given 11/15/21 2030)  ? ? ? ?IMPRESSION / MDM / ASSESSMENT AND PLAN / ED COURSE  ?I reviewed the triage vital signs and the nursing notes. ?             ?               ? ? ?Differential diagnosis includes, but is not limited to,  lumbosacral strain, cellulitis, lumbar radiculopathy, sciatica. ? ?ED Course ?Patient appears well.  Vital signs within normal limits.  We will go ahead treat here with ketorolac. ? ?X-ray shows no evidence of fracture in the lumbar spine or left hip. ? ?Assessment/Plan ?Presentation consistent with sciatica.  Patient treated here with ketorolac.  Upon reexamination, patient states that his pain is much better.  We will plan to discharge this patient with a prescription for lidocaine patches and methocarbamol.  Encouraged him to continue treating pain with Tylenol/ibuprofen as needed.  Advised him to follow-up with his primary care provider for ongoing management.  We will plan to discharge this patient ? ?Patient was provided with anticipatory guidance, return precautions, and educational material. Encouraged the patient to return to the emergency department at any time if they begin to experience any new or worsening symptoms.  ? ?  ? ? ?FINAL CLINICAL IMPRESSION(S) / ED DIAGNOSES  ? ?Final diagnoses:  ?Sciatica of left side  ? ? ? ?Rx / DC Orders  ? ?ED Discharge Orders   ? ?      Ordered  ?  lidocaine (LIDODERM) 5 %  Every 24 hours       ? 11/15/21 2040  ?  methocarbamol (ROBAXIN) 500 MG tablet  3 times daily       ? 11/15/21 2040  ? ?  ?  ? ?  ? ? ? ?Note:  This document was prepared using Dragon voice recognition software and may include unintentional dictation errors. ?  ?Teodoro Spray, [place] ?11/15/21 2103 ? ?  ?Harvest Dark, MD ?11/15/21 2318 ? ?

## 2021-11-15 NOTE — ED Triage Notes (Signed)
Pt arrived via POV with reports of L hip pain x 3 days, reports the pain radiates down L left. PT walking with limp and cane, pt denies any injury reports similar pain back in 2014.  ?

## 2021-11-15 NOTE — Discharge Instructions (Addendum)
-  Take Tylenol/ibuprofen as needed for pain.  You may also utilize your prescription for methocarbamol and lidocaine patches as well. ?-Follow-up with your primary care provider for ongoing management. ?-Return to the emergency department at any time if you begin to experience any new or worsening symptoms. ?

## 2021-11-15 NOTE — ED Notes (Signed)
Pt refused discharge vitals at this time.

## 2021-11-30 DIAGNOSIS — M4726 Other spondylosis with radiculopathy, lumbar region: Secondary | ICD-10-CM | POA: Diagnosis not present

## 2021-11-30 DIAGNOSIS — M5136 Other intervertebral disc degeneration, lumbar region: Secondary | ICD-10-CM | POA: Diagnosis not present

## 2021-11-30 DIAGNOSIS — M5416 Radiculopathy, lumbar region: Secondary | ICD-10-CM | POA: Diagnosis not present

## 2021-11-30 DIAGNOSIS — M166 Other bilateral secondary osteoarthritis of hip: Secondary | ICD-10-CM | POA: Diagnosis not present

## 2021-12-21 DIAGNOSIS — R29898 Other symptoms and signs involving the musculoskeletal system: Secondary | ICD-10-CM | POA: Diagnosis not present

## 2021-12-21 DIAGNOSIS — M79605 Pain in left leg: Secondary | ICD-10-CM | POA: Diagnosis not present

## 2021-12-21 DIAGNOSIS — M5416 Radiculopathy, lumbar region: Secondary | ICD-10-CM | POA: Diagnosis not present

## 2021-12-28 DIAGNOSIS — M5416 Radiculopathy, lumbar region: Secondary | ICD-10-CM | POA: Diagnosis not present

## 2021-12-28 DIAGNOSIS — M166 Other bilateral secondary osteoarthritis of hip: Secondary | ICD-10-CM | POA: Diagnosis not present

## 2021-12-28 DIAGNOSIS — M5136 Other intervertebral disc degeneration, lumbar region: Secondary | ICD-10-CM | POA: Diagnosis not present

## 2021-12-28 DIAGNOSIS — M4726 Other spondylosis with radiculopathy, lumbar region: Secondary | ICD-10-CM | POA: Diagnosis not present

## 2022-02-11 DIAGNOSIS — I5021 Acute systolic (congestive) heart failure: Secondary | ICD-10-CM | POA: Diagnosis not present

## 2022-02-11 DIAGNOSIS — Z0000 Encounter for general adult medical examination without abnormal findings: Secondary | ICD-10-CM | POA: Diagnosis not present

## 2022-02-11 DIAGNOSIS — E782 Mixed hyperlipidemia: Secondary | ICD-10-CM | POA: Diagnosis not present

## 2022-02-11 DIAGNOSIS — L03031 Cellulitis of right toe: Secondary | ICD-10-CM | POA: Diagnosis not present

## 2022-02-11 DIAGNOSIS — I119 Hypertensive heart disease without heart failure: Secondary | ICD-10-CM | POA: Diagnosis not present

## 2022-04-13 ENCOUNTER — Emergency Department (HOSPITAL_COMMUNITY): Payer: Medicare Other

## 2022-04-13 ENCOUNTER — Encounter (HOSPITAL_COMMUNITY): Payer: Self-pay | Admitting: Emergency Medicine

## 2022-04-13 ENCOUNTER — Other Ambulatory Visit: Payer: Self-pay

## 2022-04-13 ENCOUNTER — Emergency Department (HOSPITAL_COMMUNITY)
Admission: EM | Admit: 2022-04-13 | Discharge: 2022-04-13 | Disposition: A | Discharge status: Home / Self Care | Payer: Medicare Other | Attending: Emergency Medicine | Admitting: Emergency Medicine

## 2022-04-13 DIAGNOSIS — R509 Fever, unspecified: Secondary | ICD-10-CM | POA: Insufficient documentation

## 2022-04-13 DIAGNOSIS — M25552 Pain in left hip: Secondary | ICD-10-CM | POA: Diagnosis not present

## 2022-04-13 DIAGNOSIS — Z85828 Personal history of other malignant neoplasm of skin: Secondary | ICD-10-CM | POA: Insufficient documentation

## 2022-04-13 DIAGNOSIS — Z7982 Long term (current) use of aspirin: Secondary | ICD-10-CM | POA: Insufficient documentation

## 2022-04-13 DIAGNOSIS — M47816 Spondylosis without myelopathy or radiculopathy, lumbar region: Secondary | ICD-10-CM | POA: Diagnosis not present

## 2022-04-13 DIAGNOSIS — M5432 Sciatica, left side: Secondary | ICD-10-CM

## 2022-04-13 DIAGNOSIS — M545 Low back pain, unspecified: Secondary | ICD-10-CM | POA: Diagnosis not present

## 2022-04-13 DIAGNOSIS — M5442 Lumbago with sciatica, left side: Secondary | ICD-10-CM | POA: Diagnosis not present

## 2022-04-13 DIAGNOSIS — M79604 Pain in right leg: Secondary | ICD-10-CM | POA: Diagnosis not present

## 2022-04-13 MED ORDER — PREDNISONE 10 MG PO TABS: 20.0000 mg | ORAL_TABLET | Freq: Every day | ORAL | 0 refills | Status: DC

## 2022-04-13 MED ORDER — OXYCODONE-ACETAMINOPHEN 5-325 MG PO TABS
1.0000 | ORAL_TABLET | Freq: Once | ORAL | Status: AC
  Administered 2022-04-13: 1 via ORAL
  Filled 2022-04-13: qty 1

## 2022-04-13 MED ORDER — GABAPENTIN 100 MG PO CAPS: 100.0000 mg | ORAL_CAPSULE | Freq: Three times a day (TID) | ORAL | 0 refills | Status: DC

## 2022-04-13 MED ORDER — IBUPROFEN 800 MG PO TABS
800.0000 mg | ORAL_TABLET | Freq: Once | ORAL | Status: AC
  Administered 2022-04-13: 800 mg via ORAL
  Filled 2022-04-13: qty 1

## 2022-04-13 NOTE — ED Provider Notes (Signed)
[facility] DEPT Provider Note   CSN: [id number] Arrival date & time: 04/13/22  0807     History  Chief Complaint  Patient presents with   Back Pain    Benjamin [name] is a 76 y.o. male.  HPI 76 year old male history of cancer presents today complaining of fever.  There is no trauma or other admission that could not move yesterday.  No loss of sensation, loss of bowel control. Pain improved from yesterday.     Home Medications Prior to Admission medications   Medication Sig Start Date End Date Taking? Authorizing Provider  gabapentin (NEURONTIN) 100 MG capsule Take 1 capsule (100 mg total) by mouth 3 (three) times daily. 04/13/22  Yes Pattricia Boss, MD  predniSONE (DELTASONE) 10 MG tablet Take 2 tablets (20 mg total) by mouth daily. 04/13/22  Yes Pattricia Boss, MD  aspirin EC 81 MG tablet Take 81 mg by mouth daily.     [provider]  benzonatate (TESSALON) 100 MG capsule Take 1 capsule (100 mg total) by mouth every 8 (eight) hours. 10/15/18   Ashley Murrain, NP  diclofenac Sodium (VOLTAREN) 1 % GEL Apply 2 g topically in the morning and at bedtime. 07/04/21   Montine Circle, PA-C  fluticasone (FLONASE) 50 MCG/ACT nasal spray Place 1 spray into both nostrils daily. 08/08/17   Ashley Murrain, NP  lidocaine (LIDODERM) 5 % Place 1 patch onto the skin daily. Remove & Discard patch within 12 hours or as directed by MD 11/15/21 11/15/22  Teodoro Spray, PA  Menthol, Topical Analgesic, (ICY HOT BACK EX) Apply 1 application topically daily as needed (back pain.).    [provider]  Multiple Vitamin (MULTIVITAMIN WITH MINERALS) TABS tablet Take 2 tablets by mouth daily.     [provider]  OVER THE COUNTER MEDICATION Take 1 tablet by mouth daily as needed (seasonal allergies). [facility] allergy medication     [provider]  Phenylephrine-DM-GG-APAP (VICKS DAYQUIL SEVERE COLD/FLU) 5-10-200-325 MG TABS Take 2 tablets by  mouth 2 (two) times daily.    [provider]      Allergies    Patient has no known allergies.    Review of Systems   Review of Systems  Physical Exam Updated Vital Signs BP 133/86 (BP Location: Left Arm)   Pulse 82   Temp 98.1 F (36.7 C) (Oral)   Resp 18   Ht 1.778 m (_0 )   Wt 104.3 kg   SpO2 100%   BMI 33.00 kg/m  Physical Exam Vitals and nursing note reviewed.  Constitutional:      Appearance: Normal appearance.  HENT:     Head: Normocephalic.     Right Ear: External ear normal.     Left Ear: External ear normal.     Mouth/Throat:     Mouth: Mucous membranes are moist.  Eyes:     Pupils: Pupils are equal, round, and reactive to light.  Cardiovascular:     Rate and Rhythm: Normal rate and regular rhythm.     Pulses: Normal pulses.  Pulmonary:     Effort: Pulmonary effort is normal.     Breath sounds: Normal breath sounds.  Abdominal:     General: Abdomen is flat.     Palpations: Abdomen is soft.  Musculoskeletal:        General: No swelling or tenderness. Normal range of motion.     Cervical back: Normal range of motion.  Skin:  General: Skin is warm.     Capillary Refill: Capillary refill takes less than 2 seconds.  Neurological:     General: No focal deficit present.     Mental Status: He is alert and oriented to person, place, and time.     Cranial Nerves: No cranial nerve deficit.     Sensory: No sensory deficit.     Motor: No weakness.     Coordination: Coordination normal.     Gait: Gait normal.     Deep Tendon Reflexes: Reflexes normal.  Psychiatric:        Mood and Affect: Mood normal.        Behavior: Behavior normal.     ED Results / Procedures / Treatments   Labs (all labs ordered are listed, but only abnormal results are displayed) Labs Reviewed - No data to display  EKG None  Radiology No results found.  Procedures Procedures    Medications Ordered in ED Medications  oxyCODONE-acetaminophen  (PERCOCET/ROXICET) 5-325 MG per tablet 1 tablet (1 tablet Oral Given 04/13/22 0842)  ibuprofen (ADVIL) tablet 800 mg (800 mg Oral Given 04/13/22 1610)    ED Course/ Medical Decision Making/ A&P Clinical Course as of 04/16/22 1722  Tue Apr 13, 2022  0937 Pelvis and hip x-Ruel Dimmick reviewed radiologist interpretation concurs [DR]  [phone] CT of back reviewed and no evidence of acute abnormality noted Radiologist interpretation concurs [DR]    Clinical Course User Index [DR] Pattricia Boss, MD                           Medical Decision Making 76 yo male complaining of left buttock pain radiating to left lower leg dDX includes but is not limited to: Vascular insufficiency/occlusion Fracture Metastatic disease Radiculopathy CT obtained no fracture or met noted Patient with normal neuro exam Advised re return precautions and need for follow-up  Amount and/or Complexity of Data Reviewed Radiology: ordered.  Risk Prescription drug management.           Final Clinical Impression(s) / ED Diagnoses Final diagnoses:  Sciatica of left side    Rx / DC Orders ED Discharge Orders          Ordered    gabapentin (NEURONTIN) 100 MG capsule  3 times daily        04/13/22 0933    predniSONE (DELTASONE) 10 MG tablet  Daily        04/13/22 0933              Pattricia Boss, MD 04/16/22 1722

## 2022-04-13 NOTE — ED Triage Notes (Addendum)
Pt reports lower back pain that radiates down his left leg. Reports hx of the same. States pain started yesterday. Denies injury, no loss of control of bowel or bladder. Pt AxOx4. NAD. Does report hx of prostate cancer. Denies any other associated symptoms.

## 2022-07-13 ENCOUNTER — Emergency Department (HOSPITAL_COMMUNITY)
Admission: EM | Admit: 2022-07-13 | Discharge: 2022-07-13 | Disposition: A | Discharge status: Home / Self Care | Payer: Medicare Other | Attending: Emergency Medicine | Admitting: Emergency Medicine

## 2022-07-13 ENCOUNTER — Emergency Department (HOSPITAL_COMMUNITY): Payer: Medicare Other

## 2022-07-13 DIAGNOSIS — M25552 Pain in left hip: Secondary | ICD-10-CM | POA: Insufficient documentation

## 2022-07-13 DIAGNOSIS — Z8546 Personal history of malignant neoplasm of prostate: Secondary | ICD-10-CM | POA: Diagnosis not present

## 2022-07-13 DIAGNOSIS — Z7982 Long term (current) use of aspirin: Secondary | ICD-10-CM | POA: Insufficient documentation

## 2022-07-13 NOTE — ED Provider Notes (Signed)
[facility]EMERGENCY DEPT Provider Note   CSN: [id number] Arrival date & time: 07/13/22  0907     History  Chief Complaint  Patient presents with   Hip Pain    Benjamin [name] is a 76 y.o. male.  76 year old male presents today for evaluation of intermittent left hip pain that has been ongoing for the past 2 days.  He states he was in shower 2 days ago when he suddenly felt a sharp stabbing pain.  It will resolve with complete resolution in between the episodes.  States he had another episode this morning while he was in the shower.  Denies any falls, or other injury.  Currently he is without pain or other complaints.  Ambulates without difficulty.  Has history of sciatica.  The history is provided by the patient. No language interpreter was used.       Home Medications Prior to Admission medications   Medication Sig Start Date End Date Taking? Authorizing Provider  aspirin EC 81 MG tablet Take 81 mg by mouth daily.     [provider]  benzonatate (TESSALON) 100 MG capsule Take 1 capsule (100 mg total) by mouth every 8 (eight) hours. 10/15/18   Janne Napoleon, NP  diclofenac Sodium (VOLTAREN) 1 % GEL Apply 2 g topically in the morning and at bedtime. 07/04/21   Roxy Horseman, PA-C  fluticasone (FLONASE) 50 MCG/ACT nasal spray Place 1 spray into both nostrils daily. 08/08/17   Janne Napoleon, NP  gabapentin (NEURONTIN) 100 MG capsule Take 1 capsule (100 mg total) by mouth 3 (three) times daily. 04/13/22   Margarita Grizzle, MD  lidocaine (LIDODERM) 5 % Place 1 patch onto the skin daily. Remove & Discard patch within 12 hours or as directed by MD 11/15/21 11/15/22  Varney Daily, PA  Menthol, Topical Analgesic, (ICY HOT BACK EX) Apply 1 application topically daily as needed (back pain.).    [provider]  Multiple Vitamin (MULTIVITAMIN WITH MINERALS) TABS tablet Take 2 tablets by mouth daily.     [provider]  OVER THE COUNTER  MEDICATION Take 1 tablet by mouth daily as needed (seasonal allergies). [facility] allergy medication     [provider]  Phenylephrine-DM-GG-APAP (VICKS DAYQUIL SEVERE COLD/FLU) 5-10-200-325 MG TABS Take 2 tablets by mouth 2 (two) times daily.    [provider]  predniSONE (DELTASONE) 10 MG tablet Take 2 tablets (20 mg total) by mouth daily. 04/13/22   Margarita Grizzle, MD      Allergies    Patient has no known allergies.    Review of Systems   Review of Systems  Constitutional:  Negative for fever.  Musculoskeletal:  Positive for arthralgias. Negative for gait problem.  All other systems reviewed and are negative.   Physical Exam Updated Vital Signs BP 133/87 (BP Location: Right Arm)   Pulse 76   Temp 98 F (36.7 C) (Oral)   Resp 16   Ht 5\' 10"  (1.778 m)   Wt 104.3 kg   SpO2 100%   BMI 33.00 kg/m  Physical Exam Vitals and nursing note reviewed.  Constitutional:      General: He is not in acute distress.    Appearance: Normal appearance. He is not ill-appearing.  HENT:     Head: Normocephalic and atraumatic.     Nose: Nose normal.  Eyes:     Conjunctiva/sclera: Conjunctivae normal.  Cardiovascular:     Rate and Rhythm: Normal rate.  Pulmonary:  Effort: Pulmonary effort is normal. No respiratory distress.  Abdominal:     General: There is no distension.     Palpations: Abdomen is soft.     Tenderness: There is no abdominal tenderness. There is no guarding.  Musculoskeletal:        General: No swelling or deformity. Normal range of motion.     Right lower leg: No edema.     Left lower leg: No edema.     Comments: Patient with full range of motion bilateral lower extremities.  Ambulates without difficulty.  Bilateral hips without tenderness palpation.  Lumbar spine without tenderness to palpation.  Lumbar paraspinal muscles without tenderness to palpation.  Sensation intact in bilateral lower extremities.  2+ DP pulse present.  Skin:    Findings: No  rash.  Neurological:     Mental Status: He is alert.     ED Results / Procedures / Treatments   Labs (all labs ordered are listed, but only abnormal results are displayed) Labs Reviewed - No data to display  EKG None  Radiology No results found.  Procedures Procedures    Medications Ordered in ED Medications - No data to display  ED Course/ Medical Decision Making/ A&P                           Medical Decision Making Amount and/or Complexity of Data Reviewed Radiology: ordered.   Medical Decision Making / ED Course   This patient presents to the ED for concern of left hip pain, this involves an extensive number of treatment options, and is a complaint that carries with it a high risk of complications and morbidity.  The differential diagnosis includes septic joint, arthritis, fracture, muscle strain  MDM: 76 year old male presents today for intermittent left hip pain has been ongoing for the past 2 days.  Currently pain-free.  Exam reassuring.  Ambulates without difficulty.  Nontoxic-appearing.  Left hip without tenderness to palpation.  No overlying erythema.  Afebrile, and full range of motion.  Low suspicion for septic joint.  Will obtain x-ray.  X-ray showed evidence of arthritis otherwise no acute process.  Discussed using Tylenol for pain control and follow-up with orthopedics.  Referral given.  Patient is appropriate for discharge.  Discharged in stable condition.  Patient evaluated by attending Dr. Rosalia Hammers as well.   Lab Tests: -I ordered, reviewed, and interpreted labs.   The pertinent results include:   Labs Reviewed - No data to display    EKG  EKG Interpretation  Date/Time:    Ventricular Rate:    PR Interval:    QRS Duration:   QT Interval:    QTC Calculation:   R Axis:     Text Interpretation:           Imaging Studies ordered: I ordered imaging studies including left hip x-ray I independently visualized and interpreted imaging. I agree  with the radiologist interpretation   Medicines ordered and prescription drug management: No orders of the defined types were placed in this encounter.   -I have reviewed the patients home medicines and have made adjustments as needed  Reevaluation: After the interventions noted above, I reevaluated the patient and found that they have :stayed the same  Co morbidities that complicate the patient evaluation  Past Medical History:  Diagnosis Date   Allergy    Arthritis    back   Prostate cancer ([facility]) dx'd 2010   surg only  Dispostion: Patient is appropriate for discharge.  Discharged in stable condition.  Return precautions discussed.  Patient voices understanding and is in agreement with plan.   Final Clinical Impression(s) / ED Diagnoses Final diagnoses:  Left hip pain    Rx / DC Orders ED Discharge Orders     None         Marita Kansas, PA-C 07/13/22 1427    Margarita Grizzle, MD 07/14/22 [phone]

## 2022-07-13 NOTE — Discharge Instructions (Signed)
Your x-ray today did not show any factors.  It showed arthritis.  I recommend that you take extra strength Tylenol for the pain.  I have given you information for an orthopedist.  Please call their office to schedule an appointment for follow-up.  For any concerning symptoms return to the emergency room.  No evidence of infection.  While you are in the emergency room you are without pain and you are able to walk without difficulty.

## 2022-07-13 NOTE — ED Triage Notes (Signed)
Pt states he started having left hip pain 2 days ago. Pt states he was getting in the shower and the pain started. States the pain comes and goes.

## 2022-08-12 DIAGNOSIS — M25561 Pain in right knee: Secondary | ICD-10-CM | POA: Diagnosis not present

## 2022-08-12 DIAGNOSIS — R7303 Prediabetes: Secondary | ICD-10-CM | POA: Diagnosis not present

## 2022-08-12 DIAGNOSIS — E782 Mixed hyperlipidemia: Secondary | ICD-10-CM | POA: Diagnosis not present

## 2022-11-04 ENCOUNTER — Emergency Department (HOSPITAL_COMMUNITY)
Admission: EM | Admit: 2022-11-04 | Discharge: 2022-11-04 | Disposition: A | Discharge status: Home / Self Care | Payer: 59 | Attending: Emergency Medicine | Admitting: Emergency Medicine

## 2022-11-04 ENCOUNTER — Encounter (HOSPITAL_COMMUNITY): Payer: Self-pay

## 2022-11-04 DIAGNOSIS — J302 Other seasonal allergic rhinitis: Secondary | ICD-10-CM | POA: Diagnosis not present

## 2022-11-04 DIAGNOSIS — Z8546 Personal history of malignant neoplasm of prostate: Secondary | ICD-10-CM | POA: Insufficient documentation

## 2022-11-04 DIAGNOSIS — R059 Cough, unspecified: Secondary | ICD-10-CM | POA: Diagnosis present

## 2022-11-04 DIAGNOSIS — Z7982 Long term (current) use of aspirin: Secondary | ICD-10-CM | POA: Insufficient documentation

## 2022-11-04 DIAGNOSIS — U071 COVID-19: Secondary | ICD-10-CM

## 2022-11-04 LAB — RESP PANEL BY RT-PCR (RSV, FLU A&B, COVID)  RVPGX2
Influenza A by PCR: NEGATIVE
Influenza B by PCR: NEGATIVE
Resp Syncytial Virus by PCR: NEGATIVE
SARS Coronavirus 2 by RT PCR: POSITIVE — AB

## 2022-11-04 MED ORDER — FLUTICASONE PROPIONATE 50 MCG/ACT NA SUSP: 1.0000 | Freq: Every day | NASAL | 2 refills | Status: DC

## 2022-11-04 MED ORDER — CETIRIZINE HCL 10 MG PO TABS: 10.0000 mg | ORAL_TABLET | Freq: Every day | ORAL | 2 refills | Status: DC

## 2022-11-04 NOTE — ED Triage Notes (Signed)
Pt reports a non-productive cough for approx one week. Pt also reports he has been sneezing a lot as well.

## 2022-11-04 NOTE — ED Provider Notes (Signed)
[place] EMERGENCY DEPARTMENT AT [name] Provider Note   CSN: [id number] Arrival date & time: 11/04/22  1052     History  Chief Complaint  Patient presents with   Cough    Benjamin [name] is a 77 y.o. male.  Patient is a 77 year old male with history of allergies and prostate cancer who is presenting today with complaint of cough and congestion but mostly sneezing over the last week.  He denies any shortness of breath, fever, known sick contacts.  He gets his flu and COVID vaccines regularly.  He denies any shortness of breath or wheezing.  Patient reports every year at this time he has similar symptoms but does not take any allergy medication regularly.  The history is provided by the patient.  Cough      Home Medications Prior to Admission medications   Medication Sig Start Date End Date Taking? Authorizing Provider  cetirizine (ZYRTEC ALLERGY) 10 MG tablet Take 1 tablet (10 mg total) by mouth daily. 11/04/22  Yes Blanchie Dessert, MD  aspirin EC 81 MG tablet Take 81 mg by mouth daily.     [provider]  benzonatate (TESSALON) 100 MG capsule Take 1 capsule (100 mg total) by mouth every 8 (eight) hours. 10/15/18   Ashley Murrain, NP  diclofenac Sodium (VOLTAREN) 1 % GEL Apply 2 g topically in the morning and at bedtime. 07/04/21   Montine Circle, PA-C  fluticasone (FLONASE) 50 MCG/ACT nasal spray Place 1 spray into both nostrils daily. 11/04/22   Blanchie Dessert, MD  gabapentin (NEURONTIN) 100 MG capsule Take 1 capsule (100 mg total) by mouth 3 (three) times daily. 04/13/22   Pattricia Boss, MD  lidocaine (LIDODERM) 5 % Place 1 patch onto the skin daily. Remove & Discard patch within 12 hours or as directed by MD 11/15/21 11/15/22  Teodoro Spray, PA  Menthol, Topical Analgesic, (ICY HOT BACK EX) Apply 1 application topically daily as needed (back pain.).    [provider]  Multiple Vitamin (MULTIVITAMIN WITH MINERALS) TABS tablet Take 2  tablets by mouth daily.     [provider]  OVER THE COUNTER MEDICATION Take 1 tablet by mouth daily as needed (seasonal allergies). [facility] allergy medication     [provider]  Phenylephrine-DM-GG-APAP (VICKS DAYQUIL SEVERE COLD/FLU) 5-10-200-325 MG TABS Take 2 tablets by mouth 2 (two) times daily.    [provider]  predniSONE (DELTASONE) 10 MG tablet Take 2 tablets (20 mg total) by mouth daily. 04/13/22   Pattricia Boss, MD      Allergies    Patient has no known allergies.    Review of Systems   Review of Systems  Respiratory:  Positive for cough.     Physical Exam Updated Vital Signs BP (!) 113/93   Pulse 63   Temp 97.9 F (36.6 C) (Oral)   Resp 18   SpO2 100%  Physical Exam Vitals and nursing note reviewed.  Constitutional:      General: He is not in acute distress.    Appearance: He is well-developed.  HENT:     Head: Normocephalic and atraumatic.     Right Ear: Tympanic membrane normal.     Left Ear: Tympanic membrane normal.     Nose:     Right Turbinates: Swollen.     Left Turbinates: Swollen.     Mouth/Throat:     Mouth: Mucous membranes are moist.     Pharynx: No posterior oropharyngeal erythema.  Eyes:  Conjunctiva/sclera: Conjunctivae normal.     Pupils: Pupils are equal, round, and reactive to light.  Cardiovascular:     Rate and Rhythm: Normal rate and regular rhythm.     Heart sounds: No murmur heard. Pulmonary:     Effort: Pulmonary effort is normal. No respiratory distress.     Breath sounds: Normal breath sounds. No wheezing or rales.  Musculoskeletal:        General: No tenderness. Normal range of motion.     Cervical back: Normal range of motion and neck supple.  Skin:    General: Skin is warm and dry.     Findings: No erythema or rash.  Neurological:     Mental Status: He is alert and oriented to person, place, and time.  Psychiatric:        Behavior: Behavior normal.     ED Results / Procedures /  Treatments   Labs (all labs ordered are listed, but only abnormal results are displayed) Labs Reviewed  RESP PANEL BY RT-PCR (RSV, FLU A&B, COVID)  RVPGX2 - Abnormal; Notable for the following components:      Result Value   SARS Coronavirus 2 by RT PCR POSITIVE (*)    All other components within normal limits    EKG None  Radiology No results found.  Procedures Procedures    Medications Ordered in ED Medications - No data to display  ED Course/ Medical Decision Making/ A&P                             Medical Decision Making Risk OTC drugs.   Patient presenting with symptoms classic for seasonal allergies.  He has nasal congestion and sneezing.  Exam is very reassuring and vital signs are normal.  Patient was swabbed for COVID but low suspicion at this time.  Treated with Flonase and Zyrtec.  1:07 PM Swab returned patient did test positive for COVID.  However symptoms are mild and have been going on for greater than a week and outside of the window for treatment.  Supportive care only.       Final Clinical Impression(s) / ED Diagnoses Final diagnoses:  Seasonal allergies  COVID    Rx / DC Orders ED Discharge Orders          Ordered    fluticasone (FLONASE) 50 MCG/ACT nasal spray  Daily        11/04/22 1204    cetirizine (ZYRTEC ALLERGY) 10 MG tablet  Daily        11/04/22 1204              Blanchie Dessert, MD 11/04/22 1307

## 2022-11-04 NOTE — Discharge Instructions (Signed)
I will contact you if your COVID test is positive.  However it seems that this is just bad seasonal allergies.  Prescriptions were sent to your pharmacy.

## 2022-11-10 ENCOUNTER — Encounter (HOSPITAL_COMMUNITY): Payer: Self-pay | Admitting: Emergency Medicine

## 2022-11-10 ENCOUNTER — Emergency Department (HOSPITAL_COMMUNITY)
Admission: EM | Admit: 2022-11-10 | Discharge: 2022-11-10 | Disposition: A | Discharge status: Home / Self Care | Payer: 59 | Attending: Emergency Medicine | Admitting: Emergency Medicine

## 2022-11-10 ENCOUNTER — Other Ambulatory Visit: Payer: Self-pay

## 2022-11-10 DIAGNOSIS — R0989 Other specified symptoms and signs involving the circulatory and respiratory systems: Secondary | ICD-10-CM | POA: Diagnosis present

## 2022-11-10 DIAGNOSIS — U071 COVID-19: Secondary | ICD-10-CM | POA: Diagnosis not present

## 2022-11-10 DIAGNOSIS — Z7982 Long term (current) use of aspirin: Secondary | ICD-10-CM | POA: Insufficient documentation

## 2022-11-10 DIAGNOSIS — R0982 Postnasal drip: Secondary | ICD-10-CM

## 2022-11-10 MED ORDER — GUAIFENESIN 100 MG/5ML PO LIQD: 5.0000 mL | ORAL | 0 refills | Status: DC | PRN

## 2022-11-10 NOTE — ED Triage Notes (Signed)
Patient states he was seen 6 days ago and tested positive for covid. Patient states he has a tickle in his throat.

## 2022-11-10 NOTE — Discharge Instructions (Addendum)
Seen today for discomfort in her throat.  You have some mucus draining from your sinuses down the back your throat causing this sensation.  Keep taking the allergy medications as prescribed which should help, but you are prescribed medication today to help loosen the mucus.  Make sure you are drinking plenty of water to help thin the mucus as well this will also help your symptoms.  Follow-up closely with your primary care doctor.  Come back if you have severe pain, high fevers or any worrisome changes

## 2022-11-10 NOTE — ED Provider Notes (Signed)
[facility] Provider Note   CSN: [id number] Arrival date & time: 11/10/22  [id number]     History  Chief Complaint  Patient presents with   Covid Positive    Benjamin [name] is a 77 y.o. male.  He presents ER for complaint of a "tickle" in his throat.  He was here 6 days ago and diagnosed with allergies, states he received a phone call after he left that he had COVID-19 he is prescribed allergy medications which she has been taking, he states he is feeling much better but still has remaining scratchiness and tickle in his throat.  No trouble swallowing or breathing, no fevers or chills.  No chest pain or shortness of breath or any other complaints  HPI     Home Medications Prior to Admission medications   Medication Sig Start Date End Date Taking? Authorizing Provider  guaiFENesin (ROBITUSSIN) 100 MG/5ML liquid Take 5 mLs by mouth every 4 (four) hours as needed for cough or to loosen phlegm. 11/10/22  Yes Serin Thornell A, PA-C  aspirin EC 81 MG tablet Take 81 mg by mouth daily.     [provider]  benzonatate (TESSALON) 100 MG capsule Take 1 capsule (100 mg total) by mouth every 8 (eight) hours. 10/15/18   Ashley Murrain, NP  cetirizine (ZYRTEC ALLERGY) 10 MG tablet Take 1 tablet (10 mg total) by mouth daily. 11/04/22   Blanchie Dessert, MD  diclofenac Sodium (VOLTAREN) 1 % GEL Apply 2 g topically in the morning and at bedtime. 07/04/21   Montine Circle, PA-C  fluticasone (FLONASE) 50 MCG/ACT nasal spray Place 1 spray into both nostrils daily. 11/04/22   Blanchie Dessert, MD  gabapentin (NEURONTIN) 100 MG capsule Take 1 capsule (100 mg total) by mouth 3 (three) times daily. 04/13/22   Pattricia Boss, MD  lidocaine (LIDODERM) 5 % Place 1 patch onto the skin daily. Remove & Discard patch within 12 hours or as directed by MD 11/15/21 11/15/22  Teodoro Spray, PA  Menthol, Topical Analgesic, (ICY HOT BACK EX) Apply 1 application  topically daily as needed (back pain.).    [provider]  Multiple Vitamin (MULTIVITAMIN WITH MINERALS) TABS tablet Take 2 tablets by mouth daily.     [provider]  OVER THE COUNTER MEDICATION Take 1 tablet by mouth daily as needed (seasonal allergies). [facility] allergy medication     [provider]  Phenylephrine-DM-GG-APAP (VICKS DAYQUIL SEVERE COLD/FLU) 5-10-200-325 MG TABS Take 2 tablets by mouth 2 (two) times daily.    [provider]  predniSONE (DELTASONE) 10 MG tablet Take 2 tablets (20 mg total) by mouth daily. 04/13/22   Pattricia Boss, MD      Allergies    Patient has no known allergies.    Review of Systems   Review of Systems  Physical Exam Updated Vital Signs BP 135/88 (BP Location: Right Arm)   Pulse (!) 53   Temp 98 F (36.7 C) (Oral)   Resp 18   Ht 5\' 10"  (1.778 m)   Wt 104.3 kg   SpO2 100%   BMI 33.00 kg/m  Physical Exam Vitals and nursing note reviewed.  Constitutional:      General: He is not in acute distress.    Appearance: He is well-developed.  HENT:     Head: Normocephalic and atraumatic.     Mouth/Throat:     Mouth: Mucous membranes are moist.     Pharynx: No oropharyngeal exudate,  posterior oropharyngeal erythema or uvula swelling.     Comments: Postnasal drip noted Eyes:     Conjunctiva/sclera: Conjunctivae normal.  Cardiovascular:     Rate and Rhythm: Normal rate and regular rhythm.     Heart sounds: No murmur heard. Pulmonary:     Effort: Pulmonary effort is normal. No respiratory distress.     Breath sounds: Normal breath sounds.  Abdominal:     Palpations: Abdomen is soft.     Tenderness: There is no abdominal tenderness.  Musculoskeletal:        General: No swelling.     Cervical back: Neck supple.  Skin:    General: Skin is warm and dry.     Capillary Refill: Capillary refill takes less than 2 seconds.  Neurological:     Mental Status: He is alert.  Psychiatric:        Mood and Affect:  Mood normal.     ED Results / Procedures / Treatments   Labs (all labs ordered are listed, but only abnormal results are displayed) Labs Reviewed - No data to display  EKG None  Radiology No results found.  Procedures Procedures    Medications Ordered in ED Medications - No data to display  ED Course/ Medical Decision Making/ A&P                             Medical Decision Making Diagnosis: Pharyngitis, tonsillitis, GERD, postnasal drip, other ED course: Patient recently diagnosed with COVID, is feeling much better but is complaining of a tickle sensation in his throat.  He is well-appearing with reassuring vitals.  Exam only significant for small amount of clear drainage in posterior pharynx.  Advised to continue taking his allergy medications as prescribed.  Patient was insistent that he needs other medication to try to get rid of this symptom.  Discussed that we could try some guaifenesin but I discussed with him that this may continue to linger and may not go away completely for another week or so.  Discussed that he needs to follow-up with his primary care doctor.  Given strict return precautions.  Risk OTC drugs.           Final Clinical Impression(s) / ED Diagnoses Final diagnoses:  Post-nasal drip    Rx / DC Orders ED Discharge Orders          Ordered    guaiFENesin (ROBITUSSIN) 100 MG/5ML liquid  Every 4 hours PRN        11/10/22 0956              Gwenevere Abbot, PA-C 11/10/22 1001    Wynona Dove A, DO 11/10/22 1539

## 2022-12-22 ENCOUNTER — Encounter (HOSPITAL_COMMUNITY): Payer: Self-pay

## 2022-12-22 ENCOUNTER — Emergency Department (HOSPITAL_COMMUNITY)
Admission: EM | Admit: 2022-12-22 | Discharge: 2022-12-22 | Disposition: A | Discharge status: Home / Self Care | Payer: 59 | Attending: Emergency Medicine | Admitting: Emergency Medicine

## 2022-12-22 ENCOUNTER — Other Ambulatory Visit: Payer: Self-pay

## 2022-12-22 ENCOUNTER — Emergency Department (HOSPITAL_COMMUNITY): Payer: 59

## 2022-12-22 DIAGNOSIS — T148XXA Other injury of unspecified body region, initial encounter: Secondary | ICD-10-CM

## 2022-12-22 DIAGNOSIS — S32009A Unspecified fracture of unspecified lumbar vertebra, initial encounter for closed fracture: Secondary | ICD-10-CM | POA: Insufficient documentation

## 2022-12-22 DIAGNOSIS — M549 Dorsalgia, unspecified: Secondary | ICD-10-CM | POA: Diagnosis present

## 2022-12-22 DIAGNOSIS — R6889 Other general symptoms and signs: Secondary | ICD-10-CM | POA: Diagnosis not present

## 2022-12-22 DIAGNOSIS — Y92410 Unspecified street and highway as the place of occurrence of the external cause: Secondary | ICD-10-CM | POA: Diagnosis not present

## 2022-12-22 DIAGNOSIS — S301XXA Contusion of abdominal wall, initial encounter: Secondary | ICD-10-CM | POA: Diagnosis not present

## 2022-12-22 DIAGNOSIS — M545 Low back pain, unspecified: Secondary | ICD-10-CM | POA: Diagnosis not present

## 2022-12-22 DIAGNOSIS — S3991XA Unspecified injury of abdomen, initial encounter: Secondary | ICD-10-CM | POA: Diagnosis not present

## 2022-12-22 DIAGNOSIS — Z743 Need for continuous supervision: Secondary | ICD-10-CM | POA: Diagnosis not present

## 2022-12-22 DIAGNOSIS — S32019A Unspecified fracture of first lumbar vertebra, initial encounter for closed fracture: Secondary | ICD-10-CM | POA: Diagnosis not present

## 2022-12-22 LAB — COMPREHENSIVE METABOLIC PANEL
ALT: 22 U/L (ref 0–44)
AST: 29 U/L (ref 15–41)
Albumin: 3.6 g/dL (ref 3.5–5.0)
Alkaline Phosphatase: 43 U/L (ref 38–126)
Anion gap: 7 (ref 5–15)
BUN: 17 mg/dL (ref 8–23)
CO2: 29 mmol/L (ref 22–32)
Calcium: 8.7 mg/dL — ABNORMAL LOW (ref 8.9–10.3)
Chloride: 102 mmol/L (ref 98–111)
Creatinine, Ser: 0.95 mg/dL (ref 0.61–1.24)
GFR, Estimated: >60 mL/min (ref >60)
Glucose, Bld: 84 mg/dL (ref 70–99)
Potassium: 4.2 mmol/L (ref 3.5–5.1)
Sodium: 138 mmol/L (ref 135–145)
Total Bilirubin: 0.6 mg/dL (ref 0.3–1.2)
Total Protein: 7 g/dL (ref 6.5–8.1)

## 2022-12-22 LAB — CBC WITH DIFFERENTIAL/PLATELET
Abs Immature Granulocytes: 0.02 10*3/uL (ref 0.00–0.07)
Basophils Absolute: 0 10*3/uL (ref 0.0–0.1)
Basophils Relative: 0 %
Eosinophils Absolute: 0.2 10*3/uL (ref 0.0–0.5)
Eosinophils Relative: 2 %
HCT: 37.7 % — ABNORMAL LOW (ref 39.0–52.0)
Hemoglobin: 11.9 g/dL — ABNORMAL LOW (ref 13.0–17.0)
Immature Granulocytes: 0 %
Lymphocytes Relative: 24 %
Lymphs Abs: 1.5 10*3/uL (ref 0.7–4.0)
MCH: 30.2 pg (ref 26.0–34.0)
MCHC: 31.6 g/dL (ref 30.0–36.0)
MCV: 95.7 fL (ref 80.0–100.0)
Monocytes Absolute: 0.5 10*3/uL (ref 0.1–1.0)
Monocytes Relative: 8 %
Neutro Abs: 4.3 10*3/uL (ref 1.7–7.7)
Neutrophils Relative %: 66 %
Platelets: 181 10*3/uL (ref 150–400)
RBC: 3.94 MIL/uL — ABNORMAL LOW (ref 4.22–5.81)
RDW: 13.5 % (ref 11.5–15.5)
WBC: 6.5 10*3/uL (ref 4.0–10.5)
nRBC: 0 % (ref 0.0–0.2)

## 2022-12-22 LAB — URINALYSIS, ROUTINE W REFLEX MICROSCOPIC
Bilirubin Urine: NEGATIVE
Glucose, UA: NEGATIVE mg/dL
Hgb urine dipstick: NEGATIVE
Ketones, ur: NEGATIVE mg/dL
Leukocytes,Ua: NEGATIVE
Nitrite: NEGATIVE
Protein, ur: NEGATIVE mg/dL
Specific Gravity, Urine: 1.018 (ref 1.005–1.030)
pH: 5 (ref 5.0–8.0)

## 2022-12-22 MED ORDER — ONDANSETRON HCL 4 MG/2ML IJ SOLN
4.0000 mg | Freq: Once | INTRAMUSCULAR | Status: AC
  Administered 2022-12-22: 4 mg via INTRAVENOUS
  Filled 2022-12-22: qty 2

## 2022-12-22 MED ORDER — IOHEXOL 300 MG/ML  SOLN
100.0000 mL | Freq: Once | INTRAMUSCULAR | Status: AC | PRN
  Administered 2022-12-22: 100 mL via INTRAVENOUS

## 2022-12-22 MED ORDER — MORPHINE SULFATE (PF) 4 MG/ML IV SOLN
4.0000 mg | Freq: Once | INTRAVENOUS | Status: AC
  Administered 2022-12-22: 4 mg via INTRAVENOUS
  Filled 2022-12-22: qty 1

## 2022-12-22 MED ORDER — SODIUM CHLORIDE (PF) 0.9 % IJ SOLN
INTRAMUSCULAR | Status: AC
  Filled 2022-12-22: qty 50

## 2022-12-22 NOTE — Progress Notes (Signed)
Orthopedic Tech Progress Note Patient Details:  Kylor Valverde Feb 05, 1946 [id number]  Called in order to [employer] for a TLSO  BRACE   Patient ID: Dametri Ozburn, male   DOB: September 09, 1945, 77 y.o.   MRN: [id number]  Donald Pore 12/22/2022, 8:28 AM

## 2022-12-22 NOTE — ED Notes (Signed)
Called ortho for update, they are calling outside vendor to order.

## 2022-12-22 NOTE — ED Notes (Signed)
Pt transport to CT  

## 2022-12-22 NOTE — ED Notes (Signed)
Called ortho for TLSO brace, they state dayshift ortho who arrives at 7AM will be able to apply.
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
## 2022-12-23 ENCOUNTER — Telehealth: Payer: Self-pay

## 2022-12-23 NOTE — Telephone Encounter (Signed)
This RNCM received a message from [facility] ED SW indicating patient needs assistance with brace that was provided during recent ED visit   This RNCM called patient to advise TLSO Brace was ordered from [employer] [phone]), any questions related to DME that ortho provided will need to go to DME supplier. Patient verbalized understanding, will call DME supplier.  No additional TOC needs at this time.

## 2022-12-27 DIAGNOSIS — M545 Low back pain, unspecified: Secondary | ICD-10-CM | POA: Diagnosis not present

## 2022-12-31 DIAGNOSIS — S32009D Unspecified fracture of unspecified lumbar vertebra, subsequent encounter for fracture with routine healing: Secondary | ICD-10-CM | POA: Diagnosis not present

## 2023-01-24 DIAGNOSIS — R7303 Prediabetes: Secondary | ICD-10-CM | POA: Diagnosis not present

## 2023-01-24 DIAGNOSIS — E782 Mixed hyperlipidemia: Secondary | ICD-10-CM | POA: Diagnosis not present

## 2023-01-24 DIAGNOSIS — M545 Low back pain, unspecified: Secondary | ICD-10-CM | POA: Diagnosis not present

## 2023-01-24 DIAGNOSIS — E785 Hyperlipidemia, unspecified: Secondary | ICD-10-CM | POA: Diagnosis not present

## 2023-07-26 DIAGNOSIS — R7303 Prediabetes: Secondary | ICD-10-CM | POA: Diagnosis not present

## 2023-07-26 DIAGNOSIS — M545 Low back pain, unspecified: Secondary | ICD-10-CM | POA: Diagnosis not present

## 2023-07-26 DIAGNOSIS — Z0000 Encounter for general adult medical examination without abnormal findings: Secondary | ICD-10-CM | POA: Diagnosis not present

## 2023-07-26 DIAGNOSIS — E1165 Type 2 diabetes mellitus with hyperglycemia: Secondary | ICD-10-CM | POA: Diagnosis not present

## 2023-07-26 DIAGNOSIS — E782 Mixed hyperlipidemia: Secondary | ICD-10-CM | POA: Diagnosis not present

## 2023-09-02 IMAGING — CR DG LUMBAR SPINE COMPLETE 4+V
5 series · 5 of 5 positions shown · non-contrast
Comparison: None.

CLINICAL DATA: Lower back and left hip pain.

EXAM:
LUMBAR SPINE - COMPLETE 4+ VIEW

[l-spine obl (1 of 2)]
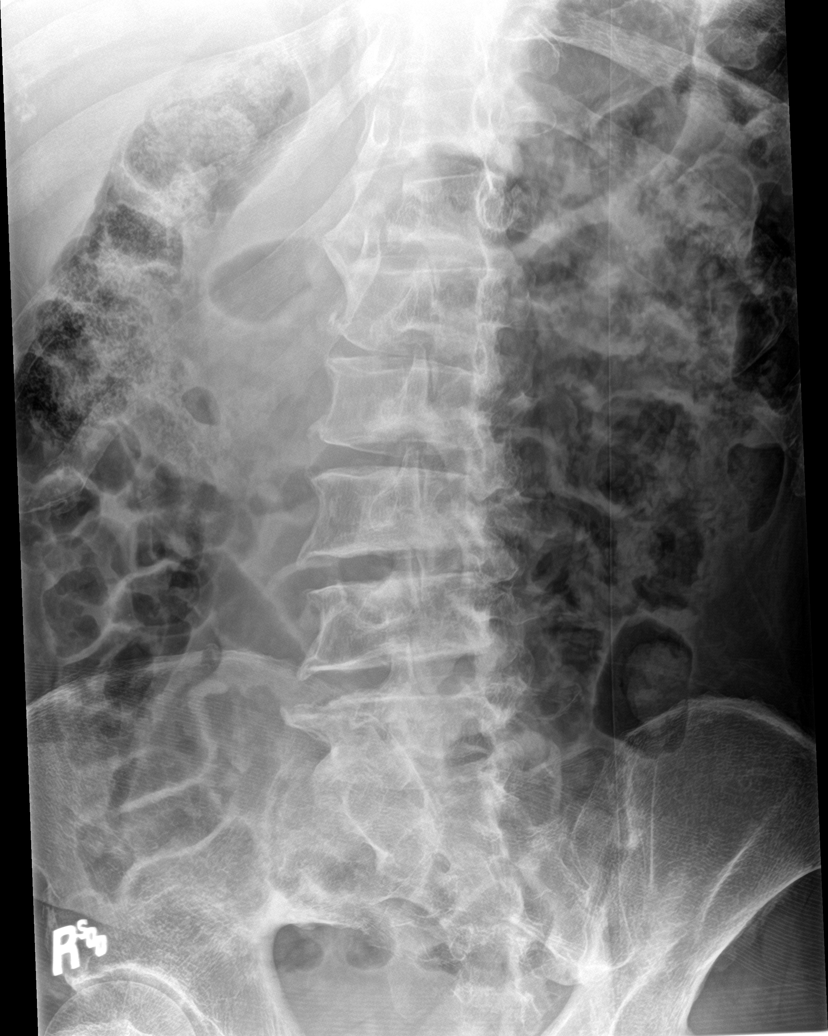

[l-spine obl (2 of 2)]
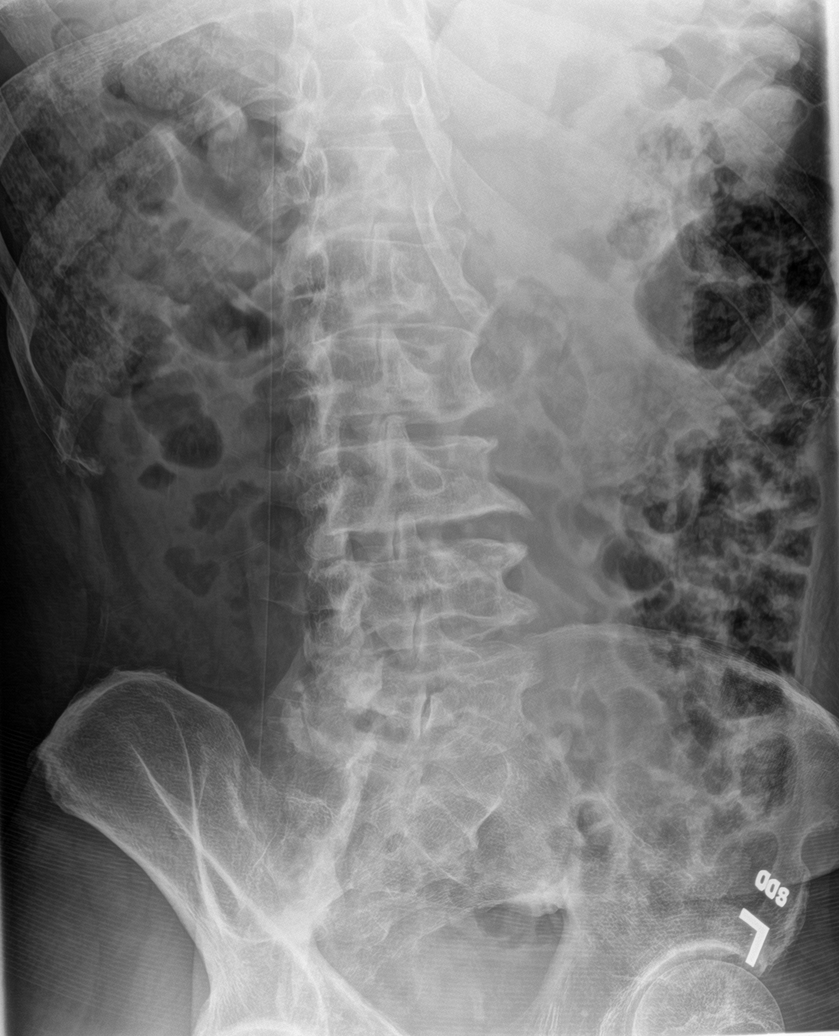

[l-spine lat]
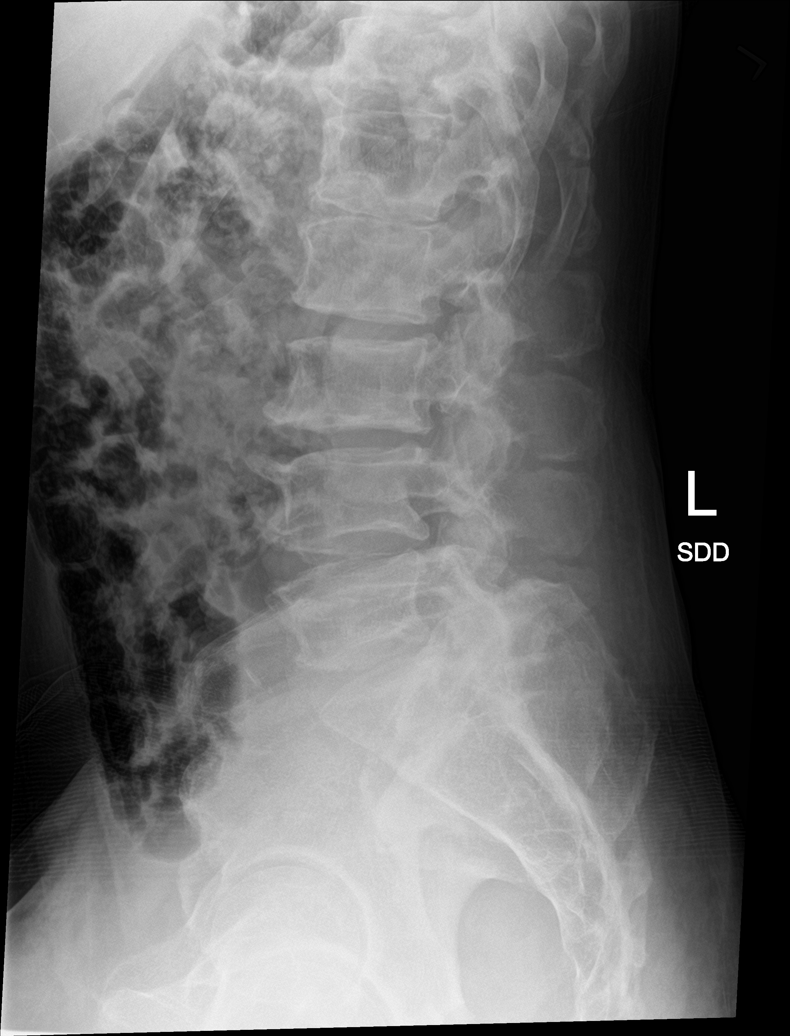

[l-spine spot]
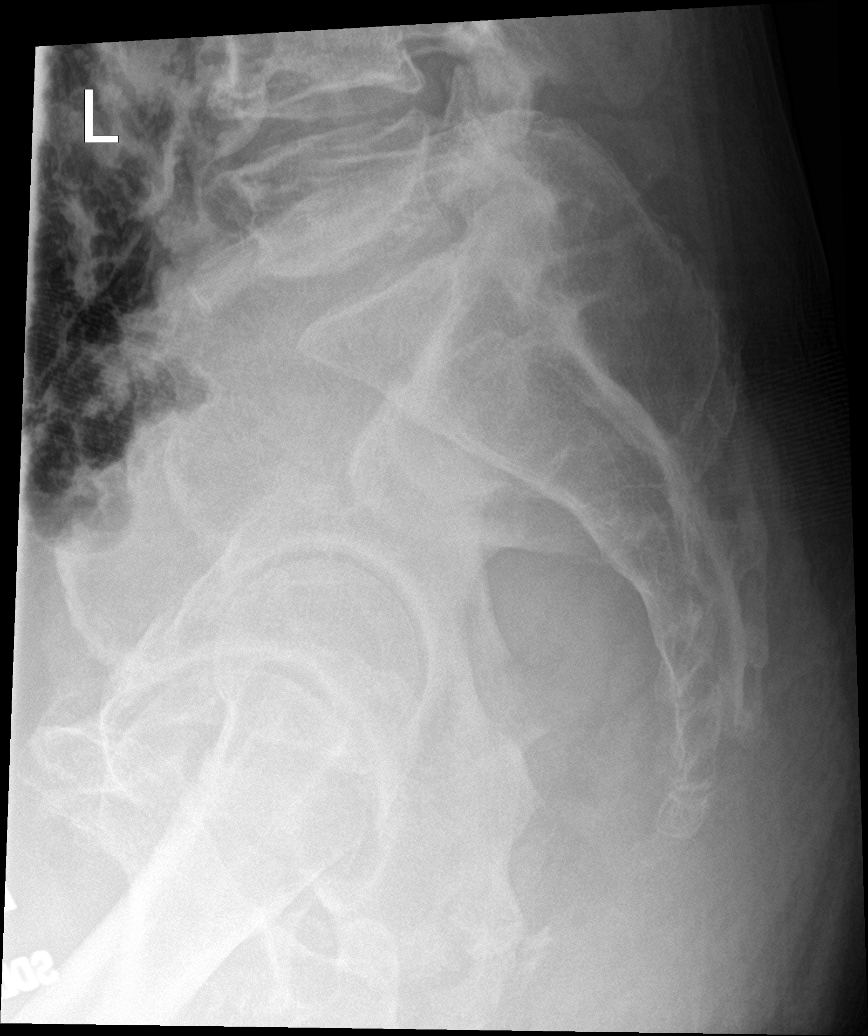

[l-spine ap]
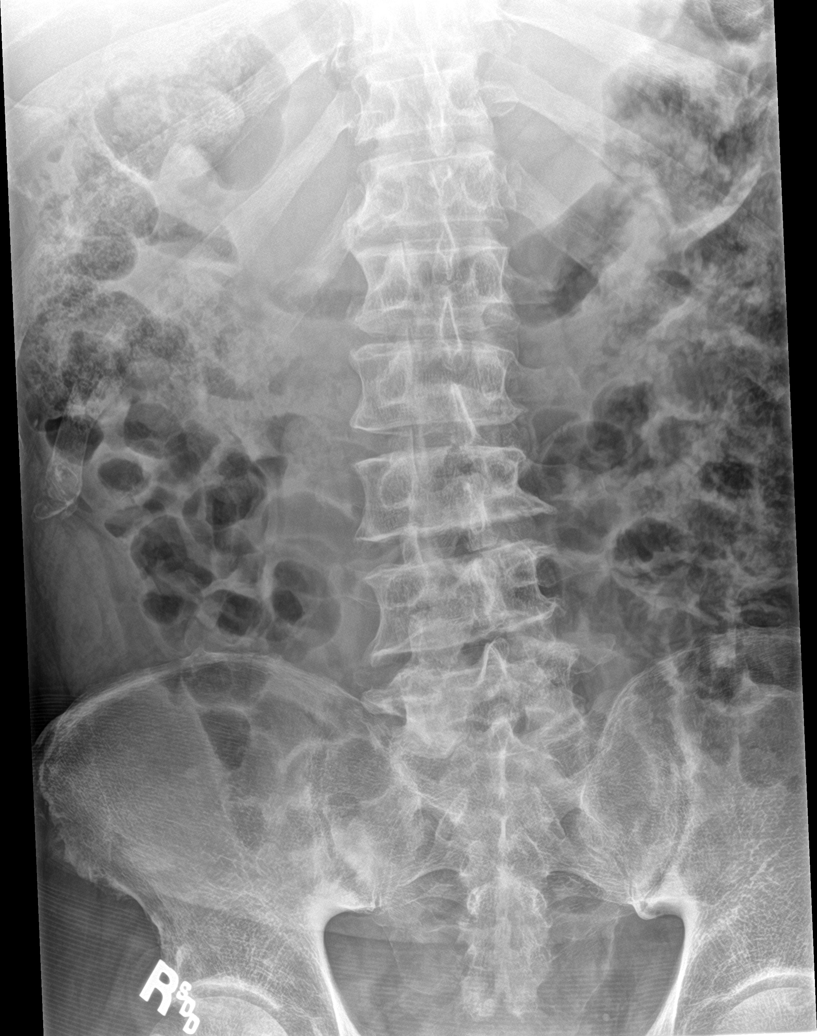

[5 of 5 positions shown; findings below may reference images not displayed]

FINDINGS: There is no evidence of an acute lumbar spine fracture. Alignment is
normal. Marked severity multilevel endplate sclerosis and anterior
osteophyte formation is seen throughout the lumbar spine. Mild
multilevel intervertebral disc space narrowing is seen throughout
the lumbar spine.
IMPRESSION: Marked severity multilevel degenerative changes, without evidence of
acute osseous abnormality.

## 2024-01-24 DIAGNOSIS — R7303 Prediabetes: Secondary | ICD-10-CM | POA: Diagnosis not present

## 2024-01-24 DIAGNOSIS — I119 Hypertensive heart disease without heart failure: Secondary | ICD-10-CM | POA: Diagnosis not present

## 2024-01-24 DIAGNOSIS — J399 Disease of upper respiratory tract, unspecified: Secondary | ICD-10-CM | POA: Diagnosis not present

## 2024-01-24 DIAGNOSIS — E782 Mixed hyperlipidemia: Secondary | ICD-10-CM | POA: Diagnosis not present

## 2024-01-24 DIAGNOSIS — M79671 Pain in right foot: Secondary | ICD-10-CM | POA: Diagnosis not present

## 2024-03-25 ENCOUNTER — Ambulatory Visit (HOSPITAL_COMMUNITY)
Admission: EM | Admit: 2024-03-25 | Discharge: 2024-03-26 | Attending: Nurse Practitioner | Admitting: Nurse Practitioner

## 2024-03-25 DIAGNOSIS — M25562 Pain in left knee: Secondary | ICD-10-CM | POA: Insufficient documentation

## 2024-03-25 DIAGNOSIS — F22 Delusional disorders: Secondary | ICD-10-CM | POA: Diagnosis present
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
## 2024-03-25 NOTE — BH Assessment (Incomplete)
 Comprehensive Clinical Assessment (CCA) Note  03/26/2024 Benjamin [name] [id number]  Disposition: Benjamin Olp, NP recommends geropsychiatric inpatient treatment. CSW to seek placement.   The patient demonstrates the following risk factors for suicide: Chronic risk factors for suicide include: {Chronic Risk Factors for Dlprpiz:69585988}. Acute risk factors for suicide include: {Acute Risk Factors for Dlprpiz:69585987}. Protective factors for this patient include: {Protective Factors for Suicide Mpdx:69585986}. Considering these factors, the overall suicide risk at this point appears to be {Desc; low/moderate/high:110033}. Patient {ACTION; IS/IS WNU:78978602} appropriate for outpatient follow up.  Benjamin [name] is a 78 year old male who presents voluntary and unaccompanied to [facility] ([facility] Clinician asked the pt, what brought you to the hospital? Pt reports, he jumped off his roof because he was running away from seven guys. Pt reports, he fell backwards and landed on his butt, he got up slow. Pt reports, he doesn't mind dying but he wants to die in peace. Pt reports, he asked the Lord to tell him how to fight. Pt reports, the Lord told him when to jump. Pt reports, Black gang leaders were after him he called the police and the gang knew he called the police. Pt reports, he's moved three times and each time a gang lived on his street. Pt reports, he has two weapons; one weapon  he's unable to describe and the second was table salt. Pt reports, he has salt in his pocket to throw in someone's eye and blind them. Pt reports, he just wants to be left alone. Pt reports, having difficulty sleeping due to someone trying to hurt him for the past three weeks. Pt denies, SI, HI, hallucinations, self-injurious behaviors and access to weapons.   Pt denies, substance use. Pt reports, he used substance in the past. Pt denies, being linked to OPT resources (medication  management and/or counseling.)   Pt presents alert with a T-shirt and scrub bottoms with normal speech and eye contact. Pt's mood was anxious. Pt's affect was congruent. Pt's insight, judgement was poor.      Chief Complaint:  Chief Complaint  Patient presents with  . Paranoid   Visit Diagnosis:     CCA Screening, Triage and Referral (STR)  Patient Reported Information How did you hear about us ? Legal System  What Is the Reason for Your Visit/Call Today? Pt presents to [facility] as a voluntary walk-in, accompanied by [facility] due to paranoia and/or delusions. Per officer, pt jumped out of 2nd story window at his residence because he believed someone was after him. Pt reports that the Benjamin [name] is after him and that is why he jumped out of the window. Pt was observed with salt in his pants pockets upon arrival, but told the officer that he planned to throw it on the people that were after him if he needed to defend himself.  Pt denies psychiatric history. Pt reports taking medication for his heart, but unable to recall what he takes. Pt also denies being established with outpatient therapist at this time. Pt currently denies SI,HI,AVH and substance/alcohol use.  How Long Has This Been Causing You Problems? <Week  What Do You Feel Would Help You the Most Today? Treatment for Depression or other mood problem   Have You Recently Had Any Thoughts About Hurting Yourself? No  Are You Planning to Commit Suicide/Harm Yourself At This time? No   Flowsheet Row ED from 03/25/2024 in [name] from 12/22/2022 in [facility] Emergency  Department at [name] ED from 11/10/2022 in [name] Emergency Department at [facility]  C-SSRS RISK CATEGORY No Risk No Risk No Risk    Have you Recently Had Thoughts About Hurting Someone Benjamin [name]? No  Are You Planning to Harm Someone at This Time? No  Explanation: NA   Have You Used Any Alcohol or  Drugs in the Past 24 Hours? No  How Long Ago Did You Use Drugs or Alcohol? Pt denies.  What Did You Use and How Much? Pt denies.   Do You Currently Have a Therapist/Psychiatrist? No  Name of Therapist/Psychiatrist:    Have You Been Recently Discharged From Any Office Practice or Programs? No  Explanation of Discharge From Practice/Program: NA    CCA Screening Triage Referral Assessment Type of Contact: Face-to-Face  Telemedicine Service Delivery:   Is this Initial or Reassessment?   Date Telepsych consult ordered in CHL:    Time Telepsych consult ordered in CHL:    Location of Assessment: [facility]  Provider Location: [facility] Assessment Services   Collateral Involvement: NA   Does Patient Have a Automotive engineer Guardian? No  Legal Guardian Contact Information: NA  Copy of Legal Guardianship Form: -- (NA)  Legal Guardian Notified of Arrival: -- (NA)  Legal Guardian Notified of Pending Discharge: -- (NA)  If Minor and Not Living with Parent(s), Who has Custody? NA  Is CPS involved or ever been involved? Never  Is APS involved or ever been involved? Never   Patient Determined To Be At Risk for Harm To Self or Others Based on Review of Patient Reported Information or Presenting Complaint? Yes, for Self-Harm  Method: No Plan  Availability of Means: No access or NA  Intent: Vague intent or NA  Notification Required: No need or identified person  Additional Information for Danger to Others Potential: Active psychosis  Additional Comments for Danger to Others Potential: NA  Are There Guns or Other Weapons in Your Home? No  Types of Guns/Weapons: NA  Are These Weapons Safely Secured?                            -- (NA)  Who Could Verify You Are Able To Have These Secured: NA  Do You Have any Outstanding Charges, Pending Court Dates, Parole/Probation? Pt denies, legal involvement.  Contacted To Inform of Risk of Harm To Self or Others:  Other: Comment (NA)    Does Patient Present under Involuntary Commitment? No    [place] of Residence: [place]   Patient Currently Receiving the Following Services: Not Receiving Services   Determination of Need: Urgent (48 hours)   Options For Referral: Other: Comment; Outpatient Therapy; Medication Management; BH Urgent Care; Inpatient Hospitalization     CCA Biopsychosocial Patient Reported Schizophrenia/Schizoaffective Diagnosis in Past: No (Simultaneous filing. User may not have seen previous data.)   Strengths: Pt was friendly and open.   Mental Health Symptoms Depression:  Fatigue; Sleep (too much or little)   Duration of Depressive symptoms: Duration of Depressive Symptoms: N/A   Mania:  None   Anxiety:   Worrying; Fatigue   Psychosis:  -- (Paranoia.)   Duration of Psychotic symptoms: Duration of Psychotic Symptoms: N/A   Trauma:  None   Obsessions:  None   Compulsions:  None   Inattention:  Forgetful; Loses things   Hyperactivity/Impulsivity:  Feeling of restlessness; Fidgets with hands/feet   Oppositional/Defiant Behaviors:  None  Emotional Irregularity:  Potentially harmful impulsivity   Other Mood/Personality Symptoms:  NA    Mental Status Exam Appearance and self-care  Stature:  Average   Weight:  Average weight   Clothing:  -- (Pt has on scrub bottoms.)   Grooming:  Normal   Cosmetic use:  None   Posture/gait:  Normal   Motor activity:  Not Remarkable   Sensorium  Attention:  Normal   Concentration:  Normal   Orientation:  X5   Recall/memory:  Normal   Affect and Mood  Affect:  Congruent   Mood:  Anxious   Relating  Eye contact:  Normal   Facial expression:  Responsive   Attitude toward examiner:  Cooperative   Thought and Language  Speech flow: Normal   Thought content:  Appropriate to Mood and Circumstances   Preoccupation:  Other (Comment) (Paranoia.)   Hallucinations:  Other (Comment)  (Paranoia.)   Organization:  Coherent   [employer] of Knowledge:  Fair   Intelligence:  Average   Abstraction:  Functional   Judgement:  Poor   Reality Testing:  Adequate   Insight:  Poor   Decision Making:  Impulsive   Social Functioning  Social Maturity:  Impulsive   Social Judgement:  Heedless   Stress  Stressors:  Other (Comment) (Feeling like someone is out to get him.)   Coping Ability:  Overwhelmed   Skill Deficits:  Decision making; Self-control   Supports:  Other (Comment) (Unsure.)     Religion: Religion/Spirituality Are You A Religious Person?:  (Pt reports, he's a minister, 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  Exercise/Diet: Exercise/Diet Do You Exercise?:  (Pt reports, I do it all.) Have You Gained or Lost A Significant Amount of Weight in the Past Six Months?: No Do You Follow a Special Diet?: No Do You Have Any Trouble Sleeping?: Yes Explanation of Sleeping Difficulties: Pt reports, he has difficulty sleeping due to someone trying to hurt him.   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 Did You Have An Individualized Education Program (IIEP): No Did You Have Any Difficulty At School?: No Patient's Education Has Been Impacted by Current Illness: No   CCA Family/Childhood History Family and Relationship History: Family history Marital status: Divorced Divorced, when?: Pt reports, he's twice  divorced. What types of issues is patient dealing with in the relationship?: UTA Additional relationship information: UTA Does patient have children?: Yes How many children?: 1 How is patient's relationship with their children?: Pt reports, his son lives in [place]  but will be visiting [place]  and will make a trip to [place]  to see the pt.  Childhood History:  Childhood History By whom was/is the patient raised?: Other (Comment) (UTA) Did patient suffer any verbal/emotional/physical/sexual abuse as a child?: No Did patient suffer from severe childhood neglect?: No Has patient ever been sexually abused/assaulted/raped as an adolescent or adult?: No Was the patient ever a victim of a crime or a disaster?: No Witnessed domestic violence?: No Has patient been affected by domestic violence  as an adult?: No   CCA Substance Use Alcohol/Drug Use: Alcohol / Drug Use Pain Medications: See MAR Prescriptions: See MAR Over the Counter: See MAR History of alcohol / drug use?: Yes Longest period of sobriety (when/how long): UTA Negative Consequences of Use:  (NA) Withdrawal Symptoms: None    ASAM's:  Six Dimensions of Multidimensional Assessment  Dimension 1:  Acute Intoxication and/or Withdrawal Potential:      Dimension 2:  Biomedical Conditions and Complications:      Dimension 3:  Emotional, Behavioral, or Cognitive Conditions and Complications:     Dimension 4:  Readiness to Change:     Dimension 5:  Relapse, Continued use, or Continued Problem Potential:     Dimension 6:  Recovery/Living Environment:     ASAM Severity Score:    ASAM Recommended Level of Treatment:     Substance use Disorder (SUD)    Recommendations for Services/Supports/Treatments: Recommendations for Services/Supports/Treatments Recommendations For Services/Supports/Treatments: Inpatient Hospitalization  Disposition Recommendation per psychiatric provider: We recommend inpatient psychiatric  hospitalization when medically cleared. Patient is under voluntary admission status at this time; please IVC if attempts to leave hospital.   DSM5 Diagnoses: There are no active problems to display for this patient.    Referrals to Alternative Service(s): Referred to Alternative Service(s):   Place:   Date:   Time:    Referred to Alternative Service(s):   Place:   Date:   Time:    Referred to Alternative Service(s):   Place:   Date:   Time:    Referred to Alternative Service(s):   Place:   Date:   Time:     Jackson JONETTA Broach, [facility] Comprehensive Clinical Assessment (CCA) Screening, Triage and Referral Note  03/26/2024 Benjamin [name] [id number]  Chief Complaint:  Chief Complaint  Patient presents with  . Paranoid   Visit Diagnosis:   Patient Reported Information How did you hear about us ? Legal System  What Is the Reason for Your Visit/Call Today? Pt presents to [facility] as a voluntary walk-in, accompanied by [facility] due to paranoia and/or delusions. Per officer, pt jumped out of 2nd story window at his residence because he believed someone was after him. Pt reports that the Benjamin [name] is after him and that is why he jumped out of the window. Pt was observed with salt in his pants pockets upon arrival, but told the officer that he planned to throw it on the people that were after him if he needed to defend himself.  Pt denies psychiatric history. Pt reports taking medication for his heart, but unable to recall what he takes. Pt also denies being established with outpatient therapist at this time. Pt currently denies SI,HI,AVH and substance/alcohol use.  How Long Has This Been Causing You Problems? <Week  What Do You Feel Would Help You the Most Today? Treatment for Depression or other mood problem   Have You Recently Had Any Thoughts About Hurting Yourself? No  Are You Planning to Commit Suicide/Harm Yourself At This time? No   Have you Recently Had Thoughts About  Hurting Someone Benjamin [name]? No  Are You Planning to Harm Someone at This Time? No  Explanation: NA   Have You Used Any Alcohol or Drugs in the Past 24 Hours? No  How Long Ago Did You Use Drugs or Alcohol? Pt denies.  What Did You Use and How Much? Pt denies.   Do You Currently Have a Therapist/Psychiatrist? No  Name of Therapist/Psychiatrist: Pt denies.  Have You Been Recently Discharged From Any Office Practice or Programs? No  Explanation of Discharge From Practice/Program: NA   CCA Screening Triage Referral Assessment Type of Contact: Face-to-Face  Telemedicine Service Delivery:   Is this Initial or Reassessment?   Date Telepsych consult ordered in CHL:    Time Telepsych consult ordered in CHL:    Location of Assessment: [facility] Assessment Services  Provider Location: [name] Assessment Services    Collateral Involvement: NA   Does Patient Have a Automotive engineer Guardian? No. Name and Contact of Legal Guardian: NA If Minor and Not Living with Parent(s), Who has Custody? NA  Is CPS involved or ever been involved? Never  Is APS involved or ever been involved? Never   Patient Determined To Be At Risk for Harm To Self or Others Based on Review of Patient Reported Information or Presenting Complaint? Yes, for Self-Harm  Method: No Plan  Availability of Means: No access or NA  Intent: Vague intent or NA  Notification Required: No need or identified person  Additional Information for Danger to Others Potential: Active psychosis  Additional Comments for Danger to Others Potential: NA  Are There Guns or Other Weapons in Your Home? No  Types of Guns/Weapons: NA  Are These Weapons Safely Secured?                            -- (NA)  Who Could Verify You Are Able To Have These Secured: NA  Do You Have any Outstanding Charges, Pending Court Dates, Parole/Probation? Pt denies, legal involvement.  Contacted To Inform of Risk of Harm To Self or Others: Other:  Comment (NA)   Does Patient Present under Involuntary Commitment? No    [place] of Residence: [place]   Patient Currently Receiving the Following Services: Not Receiving Services   Determination of Need: Urgent (48 hours)   Options For Referral: Other: Comment; Outpatient Therapy; Medication Management; [facility]; Inpatient Hospitalization   Disposition Recommendation per psychiatric provider: We recommend inpatient psychiatric hospitalization when medically cleared. Patient is under voluntary admission status at this time; please IVC if attempts to leave hospital.  Jackson JONETTA Broach, [name]     Jackson JONETTA Broach, MS, [facility], [facility] Triage Specialist [phone]

## 2024-03-25 NOTE — ED Provider Notes (Signed)
 Behavioral Health Urgent Care Medical Screening Exam  Patient Name: Benjamin [name] MRN: [id number] Date of Evaluation: 03/25/24 Chief Complaint:  side pain and knee pain Diagnosis:  Final diagnoses:  Paranoia (HCC)    History of Present illness: Benjamin [name] is a 78 y.o. male patient presented to [facility] as a walk in voluntarily accompanied by [employer] after jumping from a two story building. Patient reports that tonight some people in the [place] gang were after him and he jumped to get away from those people. Patient had some salt in his pocket that he said he was going to throw salt on the people after him to blind them. Patient stated that he does not know why they are after him.  Benjamin [name], 79 y.o., male patient seen face to face by this provider, and chart reviewed on 03/25/24.  On evaluation Benjamin [name] reports that some people in a gang in the rooming house where lives were  after him. During evaluation Benjamin [name] is sitting in the assessment room and is holding his left side and has facial grimacing and appears in pain. Patient states that his pain is 6/10. He is alert, oriented x 2 cooperative, mood is  anxious, guarded and suspicious with congruent affect.  He has normal speech, but is very paranoid and worried that the people are still after him.  Patient is able to converse coherently, goal directed thoughts, no distractibility, or pre-occupation. He denies suicidal/self-harm/homicidal ideation, psychosis, and paranoia.  Patient denies any previous psychiatric history or being on psychiatric medications. Patient denies any substance use or alcohol use.   Patient is reporting pain on his left side and also left knee jumping from the building tonight. The patient will be transferred to the [facility] emergency department for further evaluation. Once patient is medically cleared, GERO-Psych is recommended for patient for crisis management, stabilization and safety.  Flowsheet  Row ED from 03/25/2024 in [facility] ED from 12/22/2022 in [name] Emergency Department at [facility] ED from 11/10/2022 in [facility] Emergency Department at [facility]  C-SSRS RISK CATEGORY No Risk No Risk No Risk    Psychiatric Specialty Exam  Presentation  General Appearance:Casual  Eye Contact:Good  Speech:Clear and Coherent  Speech Volume:Normal  Handedness:Right   Mood and Affect  Mood: Anxious  Affect: Congruent   Thought Process  Thought Processes: Coherent  Descriptions of Associations:Intact  Orientation:Full (Time, Place and Person)  Thought Content:Paranoid Ideation  Diagnosis of Schizophrenia or Schizoaffective disorder in past: No   Hallucinations:None  Ideas of Reference:Paranoia  Suicidal Thoughts:No  Homicidal Thoughts:No   Sensorium  Memory: Immediate Fair; Recent Fair; Remote Fair  Judgment: Poor  Insight: Poor   Executive Functions  Concentration: Fair  Attention Span: Fair  Recall: Poor  Fund of Knowledge: Fair  Language: Fair   Psychomotor Activity  Psychomotor Activity: Normal   Assets  Assets: Desire for Improvement; Resilience; Physical Health   Sleep  Sleep: Fair  Number of hours:  5   Physical Exam: Physical Exam HENT:     Head: Normocephalic.     Nose: Nose normal.  Eyes:     Pupils: Pupils are equal, round, and reactive to light.  Cardiovascular:     Rate and Rhythm: Normal rate.  Pulmonary:     Effort: Pulmonary effort is normal.  Abdominal:     General: Abdomen is flat.  Musculoskeletal:        General: Normal range of motion.  Cervical back: Normal range of motion.  Skin:    General: Skin is warm.  Neurological:     Mental Status: He is alert and oriented to person, place, and time.  Psychiatric:        Attention and Perception: Attention normal.        Mood and Affect: Mood is anxious.        Speech: Speech is  tangential.        Behavior: Behavior is cooperative.        Thought Content: Thought content is paranoid.        Judgment: Judgment is impulsive.    Review of Systems  Constitutional: Negative.   HENT: Negative.    Eyes: Negative.   Respiratory: Negative.    Cardiovascular: Negative.   Gastrointestinal: Negative.   Genitourinary: Negative.   Musculoskeletal:  Positive for falls.  Skin: Negative.   Neurological: Negative.   Psychiatric/Behavioral:  The patient is nervous/anxious.    Blood pressure (!) 143/74, pulse 100, temperature 98.5 F (36.9 C), temperature source Oral, resp. rate 20, SpO2 95%. There is no height or weight on file to calculate BMI.  Musculoskeletal: Strength & Muscle Tone: within normal limits Gait & Station: normal Patient leans: N/A   [facility] MSE Discharge Disposition for Follow up and Recommendations: Based on my evaluation the patient appears to have an emergency medical condition for which I recommend the patient be transferred to the emergency department for further evaluation. Patient will be transferred to [facility] for medical clearance.   Loise Esguerra E Jocelin Schuelke, NP 03/25/2024, 11:29 PM

## 2024-03-25 NOTE — Progress Notes (Signed)
   03/25/24 2220  [facility] Triage Screening (Walk-ins at [facility] only)  How Did You Hear About Us ? Legal System  What Is the Reason for Your Visit/Call Today? Pt presents to [name] as a voluntary walk-in, accompanied by [facility] Dept due to paranoia and/or delusions. Per officer, pt jumped out of 2nd story window at his residence because he believed someone was after him. Pt reports that the [name] Gordon is after him and that is why he jumped out of the window. Pt was observed with salt in his pants pockets upon arrival, but told the officer that he planned to throw it on the people that were after him if he needed to defend himself.  Pt denies psychiatric history. Pt reports taking medication for his heart, but unable to recall what he takes. Pt also denies being established with outpatient therapist at this time. Pt currently denies SI,HI,AVH and substance/alcohol use.  How Long Has This Been Causing You Problems? <Week  Have You Recently Had Any Thoughts About Hurting Yourself? No  Are You Planning to Commit Suicide/Harm Yourself At This time? No  Have you Recently Had Thoughts About Hurting Someone [name]? No  Are You Planning To Harm Someone At This Time? No  Physical Abuse Denies  Verbal Abuse Denies  Sexual Abuse Denies  Exploitation of patient/patient's resources Denies  Self-Neglect Denies  Possible abuse reported to:  (NA)  Are you currently experiencing any auditory, visual or other hallucinations? No  Have You Used Any Alcohol or Drugs in the Past 24 Hours? No  Do you have any current medical co-morbidities that require immediate attention? No  Clinician description of patient physical appearance/behavior: pt calm, cooperative,casually dressed  What Do You Feel Would Help You the Most Today? Treatment for Depression or other mood problem  If access to [facility] was not available, would you have sought care in the Emergency Department? Yes  Determination of Need Urgent (48 hours)   Options For Referral Other: Comment;Outpatient Therapy;Medication Management;BH Urgent Care;Inpatient Hospitalization  Determination of Need filed? Yes

## 2024-03-26 ENCOUNTER — Encounter (HOSPITAL_COMMUNITY): Payer: Self-pay

## 2024-03-26 ENCOUNTER — Emergency Department (HOSPITAL_COMMUNITY)
Admission: EM | Admit: 2024-03-26 | Discharge: 2024-03-27 | Disposition: A | Discharge status: Other Acute Inpatient Hospital | Attending: Student | Admitting: Student

## 2024-03-26 ENCOUNTER — Other Ambulatory Visit: Payer: Self-pay

## 2024-03-26 ENCOUNTER — Emergency Department (HOSPITAL_COMMUNITY)

## 2024-03-26 DIAGNOSIS — R109 Unspecified abdominal pain: Secondary | ICD-10-CM | POA: Insufficient documentation

## 2024-03-26 DIAGNOSIS — R079 Chest pain, unspecified: Secondary | ICD-10-CM | POA: Insufficient documentation

## 2024-03-26 DIAGNOSIS — Z8546 Personal history of malignant neoplasm of prostate: Secondary | ICD-10-CM | POA: Insufficient documentation

## 2024-03-26 DIAGNOSIS — S299XXA Unspecified injury of thorax, initial encounter: Secondary | ICD-10-CM | POA: Diagnosis not present

## 2024-03-26 DIAGNOSIS — W134XXA Fall from, out of or through window, initial encounter: Secondary | ICD-10-CM | POA: Diagnosis not present

## 2024-03-26 DIAGNOSIS — R0989 Other specified symptoms and signs involving the circulatory and respiratory systems: Secondary | ICD-10-CM | POA: Diagnosis not present

## 2024-03-26 DIAGNOSIS — F29 Unspecified psychosis not due to a substance or known physiological condition: Secondary | ICD-10-CM | POA: Diagnosis not present

## 2024-03-26 DIAGNOSIS — R440 Auditory hallucinations: Secondary | ICD-10-CM | POA: Insufficient documentation

## 2024-03-26 DIAGNOSIS — S99921A Unspecified injury of right foot, initial encounter: Secondary | ICD-10-CM | POA: Diagnosis present

## 2024-03-26 DIAGNOSIS — S92514A Nondisplaced fracture of proximal phalanx of right lesser toe(s), initial encounter for closed fracture: Secondary | ICD-10-CM | POA: Insufficient documentation

## 2024-03-26 DIAGNOSIS — F22 Delusional disorders: Secondary | ICD-10-CM | POA: Insufficient documentation

## 2024-03-26 DIAGNOSIS — F419 Anxiety disorder, unspecified: Secondary | ICD-10-CM | POA: Insufficient documentation

## 2024-03-26 DIAGNOSIS — K5900 Constipation, unspecified: Secondary | ICD-10-CM | POA: Diagnosis not present

## 2024-03-26 DIAGNOSIS — R519 Headache, unspecified: Secondary | ICD-10-CM | POA: Diagnosis not present

## 2024-03-26 DIAGNOSIS — R404 Transient alteration of awareness: Secondary | ICD-10-CM | POA: Diagnosis not present

## 2024-03-26 DIAGNOSIS — S3993XA Unspecified injury of pelvis, initial encounter: Secondary | ICD-10-CM | POA: Diagnosis not present

## 2024-03-26 DIAGNOSIS — M1711 Unilateral primary osteoarthritis, right knee: Secondary | ICD-10-CM | POA: Diagnosis not present

## 2024-03-26 DIAGNOSIS — J9811 Atelectasis: Secondary | ICD-10-CM | POA: Diagnosis not present

## 2024-03-26 DIAGNOSIS — S8991XA Unspecified injury of right lower leg, initial encounter: Secondary | ICD-10-CM | POA: Diagnosis not present

## 2024-03-26 DIAGNOSIS — R0789 Other chest pain: Secondary | ICD-10-CM | POA: Diagnosis not present

## 2024-03-26 DIAGNOSIS — M50322 Other cervical disc degeneration at C5-C6 level: Secondary | ICD-10-CM | POA: Diagnosis not present

## 2024-03-26 DIAGNOSIS — S79919A Unspecified injury of unspecified hip, initial encounter: Secondary | ICD-10-CM | POA: Diagnosis not present

## 2024-03-26 DIAGNOSIS — Y9339 Activity, other involving climbing, rappelling and jumping off: Secondary | ICD-10-CM | POA: Insufficient documentation

## 2024-03-26 DIAGNOSIS — S99922A Unspecified injury of left foot, initial encounter: Secondary | ICD-10-CM | POA: Diagnosis not present

## 2024-03-26 DIAGNOSIS — M47812 Spondylosis without myelopathy or radiculopathy, cervical region: Secondary | ICD-10-CM | POA: Diagnosis not present

## 2024-03-26 LAB — CBC WITH DIFFERENTIAL/PLATELET
Abs Immature Granulocytes: 0.02 K/uL (ref 0.00–0.07)
Basophils Absolute: 0 K/uL (ref 0.0–0.1)
Basophils Relative: 0 %
Eosinophils Absolute: 0 K/uL (ref 0.0–0.5)
Eosinophils Relative: 1 %
HCT: 37.4 % — ABNORMAL LOW (ref 39.0–52.0)
Hemoglobin: 12 g/dL — ABNORMAL LOW (ref 13.0–17.0)
Immature Granulocytes: 0 %
Lymphocytes Relative: 11 %
Lymphs Abs: 0.9 K/uL (ref 0.7–4.0)
MCH: 30.2 pg (ref 26.0–34.0)
MCHC: 32.1 g/dL (ref 30.0–36.0)
MCV: 94.2 fL (ref 80.0–100.0)
Monocytes Absolute: 0.7 K/uL (ref 0.1–1.0)
Monocytes Relative: 8 %
Neutro Abs: 6.4 K/uL (ref 1.7–7.7)
Neutrophils Relative %: 80 %
Platelets: 227 K/uL (ref 150–400)
RBC: 3.97 MIL/uL — ABNORMAL LOW (ref 4.22–5.81)
RDW: 12.8 % (ref 11.5–15.5)
WBC: 8 K/uL (ref 4.0–10.5)
nRBC: 0 % (ref 0.0–0.2)

## 2024-03-26 LAB — URINALYSIS, COMPLETE (UACMP) WITH MICROSCOPIC
Bilirubin Urine: NEGATIVE
Glucose, UA: NEGATIVE mg/dL
Hgb urine dipstick: NEGATIVE
Ketones, ur: NEGATIVE mg/dL
Leukocytes,Ua: NEGATIVE
Nitrite: NEGATIVE
Protein, ur: NEGATIVE mg/dL
Specific Gravity, Urine: 1.011 (ref 1.005–1.030)
pH: 8 (ref 5.0–8.0)

## 2024-03-26 LAB — COMPREHENSIVE METABOLIC PANEL WITH GFR
ALT: 43 U/L (ref 0–44)
AST: 63 U/L — ABNORMAL HIGH (ref 15–41)
Albumin: 3.6 g/dL (ref 3.5–5.0)
Alkaline Phosphatase: 51 U/L (ref 38–126)
Anion gap: 10 (ref 5–15)
BUN: 15 mg/dL (ref 8–23)
CO2: 27 mmol/L (ref 22–32)
Calcium: 9.2 mg/dL (ref 8.9–10.3)
Chloride: 103 mmol/L (ref 98–111)
Creatinine, Ser: 0.98 mg/dL (ref 0.61–1.24)
GFR, Estimated: >60 mL/min (ref >60)
Glucose, Bld: 118 mg/dL — ABNORMAL HIGH (ref 70–99)
Potassium: 4 mmol/L (ref 3.5–5.1)
Sodium: 140 mmol/L (ref 135–145)
Total Bilirubin: 0.9 mg/dL (ref 0.0–1.2)
Total Protein: 7.2 g/dL (ref 6.5–8.1)

## 2024-03-26 LAB — SALICYLATE LEVEL: Salicylate Lvl: <7 mg/dL — ABNORMAL LOW (ref 7.0–30.0)

## 2024-03-26 LAB — RAPID URINE DRUG SCREEN, HOSP PERFORMED
Amphetamines: NOT DETECTED
Barbiturates: NOT DETECTED
Benzodiazepines: NOT DETECTED
Cocaine: NOT DETECTED
Opiates: NOT DETECTED
Tetrahydrocannabinol: NOT DETECTED

## 2024-03-26 LAB — RESP PANEL BY RT-PCR (RSV, FLU A&B, COVID)  RVPGX2
Influenza A by PCR: NEGATIVE
Influenza B by PCR: NEGATIVE
Resp Syncytial Virus by PCR: NEGATIVE
SARS Coronavirus 2 by RT PCR: NEGATIVE

## 2024-03-26 LAB — ACETAMINOPHEN LEVEL: Acetaminophen (Tylenol), Serum: <10 ug/mL — ABNORMAL LOW (ref 10–30)

## 2024-03-26 LAB — ETHANOL: Alcohol, Ethyl (B): <15 mg/dL (ref <15)

## 2024-03-26 MED ORDER — IOHEXOL 350 MG/ML SOLN
75.0000 mL | Freq: Once | INTRAVENOUS | Status: AC | PRN
  Administered 2024-03-26: 100 mL via INTRAVENOUS

## 2024-03-26 MED ORDER — OLANZAPINE 2.5 MG PO TABS: 2.5000 mg | ORAL_TABLET | Freq: Three times a day (TID) | ORAL | Status: DC | PRN

## 2024-03-26 MED ORDER — OLANZAPINE 2.5 MG PO TABS
2.5000 mg | ORAL_TABLET | Freq: Every day | ORAL | Status: DC
  Administered 2024-03-26: 2.5 mg via ORAL
  Filled 2024-03-26: qty 1
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
## 2024-03-26 NOTE — ED Notes (Signed)
 Valuables inventoried and placed in locker 3.
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
## 2024-03-26 NOTE — Progress Notes (Signed)
 Orthopedic Tech Progress Note Patient Details:  Marco Raper 1946-01-09 [id number]  Ortho Devices Type of Ortho Device: Buddy tape, Postop shoe/boot Ortho Device/Splint Location: rle 3rd and 4th toes Ortho Device/Splint Interventions: Ordered, Application, Adjustment   Post Interventions Patient Tolerated: Well Instructions Provided: Care of device, Adjustment of device  Chandra Dorn PARAS 03/26/2024, 6:39 AM

## 2024-03-26 NOTE — ED Notes (Signed)
 IVC AFFIDAVIT AND FIRST EXAM IS BEING PLACE ON PURPLE CLIPBOARD PATIENT HAS MOVED FROM H18

## 2024-03-26 NOTE — ED Notes (Signed)
 As of 1:36pm still no answer from magistrates office I have made several attempts at this point, voicemail says don't leave message. I will continue to try until the end of my shift at 3pm
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
## 2024-03-26 NOTE — ED Notes (Signed)
PT GIVEN [place] SANDWICH AND DRINK

## 2024-03-26 NOTE — ED Notes (Signed)
 Magistrate not able to be reach by yuvanka for her shift 7a-11a, IVC was completed and uploaded but no findings yet received due to not getting an answer from magistrate office. I will be making several attempts during my work shift.

## 2024-03-26 NOTE — ED Notes (Signed)
 IVC paperwork complete and in purple zone, expires 04/02/24, case # [id number]

## 2024-03-26 NOTE — Progress Notes (Addendum)
 Pt was accepted to [facility] 03/26/2024  Bed Assignment 37  Address: [address] [place], [place], [place] [address]  -[facility] Fax: [phone]  Pt meets inpatient criteria per: Elveria Batter NP  Attending Physician will be Dr. Allyn Donnelly COME  Report can be called to: [phone]  Pt can arrive after Labs/ Consents   Care Team notified: Cherylynn Ernst RN, Elveria Batter NP, Devere Mulch RN, [facility] RN    [place] Angeleah Labrake  MSW, [facility] 03/26/2024 2:52 PM

## 2024-03-26 NOTE — ED Notes (Signed)
 IVC in progress.  Envelope Number: [id number]
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
## 2024-03-27 ENCOUNTER — Other Ambulatory Visit: Payer: Self-pay

## 2024-03-27 ENCOUNTER — Encounter: Payer: Self-pay | Admitting: Psychiatry

## 2024-03-27 ENCOUNTER — Inpatient Hospital Stay
Admission: AD | Admit: 2024-03-27 | Discharge: 2024-08-30 | DRG: 885 | Disposition: A | Discharge status: Nursing Home (Includes ICF) | Source: Intra-hospital | Attending: Psychiatry | Admitting: Psychiatry

## 2024-03-27 DIAGNOSIS — Y92231 Patient bathroom in hospital as the place of occurrence of the external cause: Secondary | ICD-10-CM | POA: Diagnosis not present

## 2024-03-27 DIAGNOSIS — S92514A Nondisplaced fracture of proximal phalanx of right lesser toe(s), initial encounter for closed fracture: Secondary | ICD-10-CM | POA: Diagnosis not present

## 2024-03-27 DIAGNOSIS — Z87891 Personal history of nicotine dependence: Secondary | ICD-10-CM | POA: Diagnosis not present

## 2024-03-27 DIAGNOSIS — Z5900 Homelessness unspecified: Secondary | ICD-10-CM

## 2024-03-27 DIAGNOSIS — Z8546 Personal history of malignant neoplasm of prostate: Secondary | ICD-10-CM | POA: Diagnosis not present

## 2024-03-27 DIAGNOSIS — S01111A Laceration without foreign body of right eyelid and periocular area, initial encounter: Secondary | ICD-10-CM | POA: Diagnosis not present

## 2024-03-27 DIAGNOSIS — F0392 Unspecified dementia, unspecified severity, with psychotic disturbance: Secondary | ICD-10-CM | POA: Diagnosis present

## 2024-03-27 DIAGNOSIS — F600 Paranoid personality disorder: Secondary | ICD-10-CM | POA: Diagnosis present

## 2024-03-27 DIAGNOSIS — G3184 Mild cognitive impairment, so stated: Secondary | ICD-10-CM | POA: Diagnosis not present

## 2024-03-27 DIAGNOSIS — F0390 Unspecified dementia without behavioral disturbance: Secondary | ICD-10-CM | POA: Diagnosis present

## 2024-03-27 DIAGNOSIS — Z0000 Encounter for general adult medical examination without abnormal findings: Secondary | ICD-10-CM | POA: Diagnosis not present

## 2024-03-27 DIAGNOSIS — Z79899 Other long term (current) drug therapy: Secondary | ICD-10-CM | POA: Diagnosis not present

## 2024-03-27 DIAGNOSIS — M479 Spondylosis, unspecified: Secondary | ICD-10-CM | POA: Diagnosis present

## 2024-03-27 DIAGNOSIS — W0110XA Fall on same level from slipping, tripping and stumbling with subsequent striking against unspecified object, initial encounter: Secondary | ICD-10-CM | POA: Diagnosis not present

## 2024-03-27 DIAGNOSIS — F940 Selective mutism: Secondary | ICD-10-CM | POA: Diagnosis present

## 2024-03-27 DIAGNOSIS — F32A Depression, unspecified: Secondary | ICD-10-CM | POA: Diagnosis present

## 2024-03-27 DIAGNOSIS — Z8781 Personal history of (healed) traumatic fracture: Secondary | ICD-10-CM | POA: Diagnosis not present

## 2024-03-27 DIAGNOSIS — H539 Unspecified visual disturbance: Secondary | ICD-10-CM | POA: Diagnosis not present

## 2024-03-27 DIAGNOSIS — S01112A Laceration without foreign body of left eyelid and periocular area, initial encounter: Secondary | ICD-10-CM | POA: Diagnosis not present

## 2024-03-27 DIAGNOSIS — F22 Delusional disorders: Secondary | ICD-10-CM | POA: Diagnosis present

## 2024-03-27 DIAGNOSIS — Z23 Encounter for immunization: Secondary | ICD-10-CM | POA: Diagnosis present

## 2024-03-27 DIAGNOSIS — K5900 Constipation, unspecified: Secondary | ICD-10-CM | POA: Diagnosis not present

## 2024-03-27 MED ORDER — MAGNESIUM HYDROXIDE 400 MG/5ML PO SUSP
30.0000 mL | Freq: Every day | ORAL | Status: DC | PRN
  Filled 2024-03-27: qty 30

## 2024-03-27 MED ORDER — OLANZAPINE 5 MG PO TABS
2.5000 mg | ORAL_TABLET | Freq: Every day | ORAL | Status: DC
  Administered 2024-03-27 – 2024-03-28 (×2): 2.5 mg via ORAL
  Filled 2024-03-27 (×2): qty 1

## 2024-03-27 MED ORDER — OLANZAPINE 10 MG IM SOLR
5.0000 mg | Freq: Three times a day (TID) | INTRAMUSCULAR | Status: DC | PRN
  Administered 2024-04-12: 5 mg via INTRAMUSCULAR
  Filled 2024-03-27: qty 10

## 2024-03-27 MED ORDER — OLANZAPINE 5 MG PO TBDP
5.0000 mg | ORAL_TABLET | Freq: Three times a day (TID) | ORAL | Status: DC | PRN
  Administered 2024-07-22: 5 mg via ORAL
  Filled 2024-03-27 (×2): qty 1

## 2024-03-27 MED ORDER — ALUM & MAG HYDROXIDE-SIMETH 200-200-20 MG/5ML PO SUSP: 30.0000 mL | ORAL | Status: DC | PRN

## 2024-03-27 MED ORDER — ACETAMINOPHEN 325 MG PO TABS
650.0000 mg | ORAL_TABLET | Freq: Four times a day (QID) | ORAL | Status: DC | PRN
  Filled 2024-03-27: qty 2

## 2024-03-27 MED ORDER — ACETAMINOPHEN 500 MG PO TABS
1000.0000 mg | ORAL_TABLET | Freq: Once | ORAL | Status: AC
  Administered 2024-03-27: 1000 mg via ORAL
  Filled 2024-03-27: qty 2

## 2024-03-27 NOTE — Tx Team (Signed)
 Initial Treatment Plan 03/27/2024 4:09 PM [name] FMW:[id number]    PATIENT STRESSORS: Traumatic event     PATIENT STRENGTHS: Communication skills    PATIENT IDENTIFIED PROBLEMS:   Paranoia  L hip pain  Trauma to bil feet               DISCHARGE CRITERIA:  Ability to meet basic life and health needs Adequate post-discharge living arrangements Improved stabilization in mood, thinking, and/or behavior Safe-care adequate arrangements made  PRELIMINARY DISCHARGE PLAN: Attend aftercare/continuing care group Return to previous living arrangement  PATIENT/FAMILY INVOLVEMENT: This treatment plan has been presented to and reviewed with the patient, Benjamin [name]. The patient has been given the opportunity to ask questions and make suggestions.   [name], RN 03/27/2024, 4:09 PM
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
## 2024-03-27 NOTE — Group Note (Signed)

## 2024-03-27 NOTE — Group Note (Signed)
 Recreation Therapy Group Note   Group Topic:Stress Management  Group Date: 03/27/2024 Start Time: 1500 End Time: 1540 Facilitators: Celestia Jeoffrey BRAVO, LRT, CTRS Location: Dayroom  Group Description: Meditation. LRT and patients discussed what they know about meditation and mindfulness. LRT played a Deep Breathing Meditation exercise script for patients to follow along to. LRT and patients discussed how meditation and deep breathing can be used as a coping skill post--discharge to help manage symptoms of stress.    Goal Area(s) Addressed: Patient will practice using relaxation technique. Patient will identify a new coping skill.  Patient will follow multistep directions to reduce anxiety and stress.   Affect/Mood: N/A   Participation Level: Did not attend    Clinical Observations/Individualized Feedback: Patient did not attend group.   Plan: Continue to engage patient in RT group sessions 2-3x/week.   [address], LRT, CTRS 03/27/2024 4:12 PM

## 2024-03-27 NOTE — Plan of Care (Signed)
  Problem: Education: Goal: Knowledge of General Education information will improve Description: Including pain rating scale, medication(s)/side effects and non-pharmacologic comfort measures Outcome: Progressing   Problem: Health Behavior/Discharge Planning: Goal: Ability to manage health-related needs will improve Outcome: Progressing   Problem: Elimination: Goal: Will not experience complications related to bowel motility Outcome: Progressing Goal: Will not experience complications related to urinary retention Outcome: Progressing

## 2024-03-27 NOTE — Group Note (Signed)
 Recreation Therapy Group Note   Group Topic:Other  Group Date: 03/27/2024 Start Time: 1100 End Time: 1120 Facilitators: Celestia Jeoffrey BRAVO, LRT, CTRS Location: Dayroom  Activity Description/Intervention: Therapeutic Drumming. Patients with peers and staff were given the opportunity to engage in a leader facilitated [facility] Group Empowerment Drumming Circle with staff from the [employer], in partnership with [employer]. Teaching laboratory technician and trained [employer], Norleen Mon leading with LRT observing and documenting intervention and pt response. This evidenced-based practice targets 7 areas of health and wellbeing in the human experience including: stress-reduction, exercise, self-expression, camaraderie/support, nurturing, spirituality, and music-making (leisure).    Goal Area(s) Addresses:  Patient will engage in pro-social way in music group.  Patient will follow directions of drum leader on the first prompt. Patient will demonstrate no behavioral issues during group.  Patient will identify if a reduction in stress level occurs as a result of participation in therapeutic drum circle.     Affect/Mood: N/A   Participation Level: Did not attend    Clinical Observations/Individualized Feedback: Patient was not on the unit at the time of group.   Plan: Continue to engage patient in RT group sessions 2-3x/week.   Jeoffrey BRAVO Celestia, LRT, CTRS 03/27/2024 2:01 PM

## 2024-03-27 NOTE — ED Notes (Signed)
 Report was given to Garen Daring, RN at 1045 from [facility]

## 2024-03-27 NOTE — ED Notes (Signed)
 Sheriff transport has been called , and voice message left, for call back and pickup

## 2024-03-27 NOTE — ED Notes (Signed)
 Sheriff has called us  back, and agreed to transport patient, to [facility]

## 2024-03-28 DIAGNOSIS — F22 Delusional disorders: Secondary | ICD-10-CM

## 2024-03-28 DIAGNOSIS — S92514A Nondisplaced fracture of proximal phalanx of right lesser toe(s), initial encounter for closed fracture: Secondary | ICD-10-CM | POA: Diagnosis not present

## 2024-03-28 MED ORDER — OLANZAPINE 5 MG PO TABS
5.0000 mg | ORAL_TABLET | Freq: Every day | ORAL | Status: DC
  Administered 2024-03-29 – 2024-04-11 (×17): 5 mg via ORAL
  Filled 2024-03-28 (×14): qty 1

## 2024-03-28 MED ORDER — DOCUSATE SODIUM 100 MG PO CAPS
100.0000 mg | ORAL_CAPSULE | Freq: Every day | ORAL | Status: DC
  Administered 2024-03-28 – 2024-06-19 (×83): 100 mg via ORAL
  Filled 2024-03-28 (×84): qty 1
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
## 2024-03-28 NOTE — BH IP Treatment Plan (Signed)
 Interdisciplinary Treatment and Diagnostic Plan Update  03/28/2024 Time of Session: 10:33 AM  Even Budlong MRN: [id number]  Principal Diagnosis: Delusional disorder [facility])  Secondary Diagnoses: Principal Problem:   Delusional disorder (HCC)   Current Medications:  Current Facility-Administered Medications  Medication Dose Route Frequency Provider Last Rate Last Admin   acetaminophen  (TYLENOL ) tablet 650 mg  650 mg Oral Q6H PRN Coleman, Carolyn H, NP       alum & mag hydroxide-simeth (MAALOX/MYLANTA) 200-200-20 MG/5ML suspension 30 mL  30 mL Oral Q4H PRN Coleman, Carolyn H, NP       magnesium  hydroxide (MILK OF MAGNESIA) suspension 30 mL  30 mL Oral Daily PRN Coleman, Carolyn H, NP       OLANZapine  (ZYPREXA ) injection 5 mg  5 mg Intramuscular TID PRN Mardy Elveria DEL, NP       OLANZapine  (ZYPREXA ) tablet 2.5 mg  2.5 mg Oral QHS Coleman, Carolyn H, NP   2.5 mg at 03/27/24 2233   OLANZapine  zydis (ZYPREXA ) disintegrating tablet 5 mg  5 mg Oral TID PRN Coleman, Carolyn H, NP       PTA Medications: Medications Prior to Admission  Medication Sig Dispense Refill Last Dose/Taking   docusate sodium  (COLACE) 100 MG capsule Take 100 mg by mouth daily.      omega-3 acid ethyl esters (LOVAZA) 1 g capsule Take 1 g by mouth daily.       Patient Stressors: Traumatic event    Patient Strengths: Communication skills   Treatment Modalities: Medication Management, Group therapy, Case management,  1 to 1 session with clinician, Psychoeducation, Recreational therapy.   Physician Treatment Plan for Primary Diagnosis: Delusional disorder (HCC) Long Term Goal(s):     Short Term Goals:    Medication Management: Evaluate patient's response, side effects, and tolerance of medication regimen.  Therapeutic Interventions: 1 to 1 sessions, Unit Group sessions and Medication administration.  Evaluation of Outcomes: Not Progressing  Physician Treatment Plan for Secondary Diagnosis: Principal  Problem:   Delusional disorder (HCC)  Long Term Goal(s):     Short Term Goals:       Medication Management: Evaluate patient's response, side effects, and tolerance of medication regimen.  Therapeutic Interventions: 1 to 1 sessions, Unit Group sessions and Medication administration.  Evaluation of Outcomes: Not Progressing   RN Treatment Plan for Primary Diagnosis: Delusional disorder (HCC) Long Term Goal(s): Knowledge of disease and therapeutic regimen to maintain health will improve  Short Term Goals: Ability to remain free from injury will improve, Ability to verbalize frustration and anger appropriately will improve, Ability to demonstrate self-control, Ability to participate in decision making will improve, Ability to verbalize feelings will improve, Ability to disclose and discuss suicidal ideas, Ability to identify and develop effective coping behaviors will improve, and Compliance with prescribed medications will improve  Medication Management: RN will administer medications as ordered by provider, will assess and evaluate patient's response and provide education to patient for prescribed medication. RN will report any adverse and/or side effects to prescribing provider.  Therapeutic Interventions: 1 on 1 counseling sessions, Psychoeducation, Medication administration, Evaluate responses to treatment, Monitor vital signs and CBGs as ordered, Perform/monitor CIWA, COWS, AIMS and Fall Risk screenings as ordered, Perform wound care treatments as ordered.  Evaluation of Outcomes: Not Progressing   LCSW Treatment Plan for Primary Diagnosis: Delusional disorder [facility]) Long Term Goal(s): Safe transition to appropriate next level of care at discharge, Engage patient in therapeutic group addressing interpersonal concerns.  Short Term Goals: Engage patient in  aftercare planning with referrals and resources, Increase social support, Increase ability to appropriately verbalize feelings,  Increase emotional regulation, Facilitate acceptance of mental health diagnosis and concerns, Facilitate patient progression through stages of change regarding substance use diagnoses and concerns, Identify triggers associated with mental health/substance abuse issues, and Increase skills for wellness and recovery  Therapeutic Interventions: Assess for all discharge needs, 1 to 1 time with Social worker, Explore available resources and support systems, Assess for adequacy in community support network, Educate family and significant other(s) on suicide prevention, Complete Psychosocial Assessment, Interpersonal group therapy.  Evaluation of Outcomes: Not Progressing   Progress in Treatment: Attending groups: Yes. and No. Participating in groups: Yes. and No. Taking medication as prescribed: Yes. Toleration medication: Yes. Family/Significant other contact made: No, will contact:  CSW will contact if given permission  Patient understands diagnosis: Yes. Discussing patient identified problems/goals with staff: Yes. Medical problems stabilized or resolved: Yes. Denies suicidal/homicidal ideation: Yes. Issues/concerns per patient self-inventory: No. Other: None  New problem(s) identified: No, Describe:  None identified   New Short Term/Long Term Goal(s): elimination of symptoms of psychosis, medication management for mood stabilization; elimination of SI thoughts; development of comprehensive mental wellness plan.   Patient Goals:  Get out of here, I want to get healed up so I can get out of here  Discharge Plan or Barriers: CSW will assist with appropriate discharge planning   Reason for Continuation of Hospitalization: Delusions  Medication stabilization  Estimated Length of Stay: 1 to 7 days  Last 3 [place] Suicide Severity Risk Score: Flowsheet Row Admission (Current) from 03/27/2024 in [facility] BEHAVIORAL [facility] ED from 03/26/2024 in [facility] Emergency Department at [facility] ED from 03/25/2024 in [facility]  C-SSRS RISK CATEGORY No Risk No Risk No Risk    Last [name] 2/9 Scores:     No data to display          Scribe for Treatment Team: [name], [place] 03/28/2024 11:12 AM
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
## 2024-03-28 NOTE — Progress Notes (Addendum)
   03/27/24 2030  Psych Admission Type (Psych Patients Only)  Admission Status Involuntary  Psychosocial Assessment  Patient Complaints None  Eye Contact Fair  Facial Expression Animated  Affect Appropriate to circumstance  Speech Logical/coherent  Interaction Assertive  Motor Activity Unsteady  Appearance/Hygiene In scrubs  Behavior Characteristics Cooperative  Mood Preoccupied  Thought Process  Coherency Disorganized;Loose associations  Content Blaming others  Delusions Paranoid  Perception Derealization  Hallucination None reported or observed  Judgment Impaired  Confusion Mild  Danger to Self  Current suicidal ideation? Denies  Agreement Not to Harm Self Yes  Description of Agreement verbal  Danger to Others  Danger to Others None reported or observed

## 2024-03-28 NOTE — Plan of Care (Signed)

## 2024-03-28 NOTE — Progress Notes (Signed)
   03/28/24 0830  Psych Admission Type (Psych Patients Only)  Admission Status Involuntary  Psychosocial Assessment  Patient Complaints Anxiety  Eye Contact Fair  Facial Expression Animated  Affect Appropriate to circumstance  Speech Logical/coherent  Interaction Assertive  Motor Activity Unsteady  Appearance/Hygiene In scrubs  Behavior Characteristics Cooperative;Anxious  Mood Anxious;Preoccupied  Thought Process  Coherency Disorganized;Loose associations  Content Blaming others;Paranoia  Delusions Paranoid  Perception Derealization  Hallucination None reported or observed  Judgment Impaired  Confusion Mild  Danger to Self  Current suicidal ideation? Denies  Agreement Not to Harm Self Yes  Description of Agreement Verbal  Danger to Others  Danger to Others None reported or observed

## 2024-03-28 NOTE — Group Note (Signed)
 Physical/Occupational Therapy Group Note  Group Topic: Yoga  Group Date: 03/28/2024 Start Time: 1300 End Time: 1330 Facilitators: Taunia Frasco, Alm Hamilton, PT   Group Description: Group participated with series of yoga poses, designed to emphasize functional sitting balance, core stability, generalized flexibility and overall posture.  Incorporated deep breathing techniques with poses, working to promote relaxation, mindfulness and focus with targeted activities.   Discussed benefits of yoga in improving mood and self-esteem, reducing stress and anxiety, and promoting functional strength and balance for each participant.  Discussed ways to integrate into each participant's daily routine.  Provided handout with written and pictorial descriptions of included yoga movements to be utilized as appropriate outside of group time.  Therapeutic Goal(s):  Demonstrate safe ability to participate with yoga poses during group activity. Identify one benefit of participation with yoga poses as part of each participant's exercise/movement routine. Identify 1-2 individual poses that participant feels most beneficial to his/her needs and that he/she can easily replicate outside of group.  Individual Participation: Did not attend  Participation Level:   Participation Quality:   Behavior:   Speech/Thought Process:   Affect/Mood:   Insight:   Judgement:   Modes of Intervention:   Patient Response to Interventions:    Plan: Continue to engage patient in PT/OT groups 1 - 2x/week.  CHARM Hamilton Bertin PT, DPT 03/28/24, 1:55 PM

## 2024-03-28 NOTE — Group Note (Unsigned)
 Date:  03/28/2024 Time:  3:58 PM  Group Topic/Focus:  Self Care:   The focus of this group is to help patients understand the importance of self-care in order to improve or restore emotional, physical, spiritual, interpersonal, and financial health.     Participation Level:  {BHH PARTICIPATION OZCZO:77735}  Participation Quality:  {BHH PARTICIPATION QUALITY:22265}  Affect:  {BHH AFFECT:22266}  Cognitive:  {BHH COGNITIVE:22267}  Insight: {BHH Insight2:20797}  Engagement in Group:  {BHH ENGAGEMENT IN HMNLE:77731}  Modes of Intervention:  {BHH MODES OF INTERVENTION:22269}  Additional Comments:  ***  [name] Gavel 03/28/2024, 3:58 PM

## 2024-03-28 NOTE — Plan of Care (Signed)
  Problem: Clinical Measurements: Goal: Will remain free from infection Outcome: Progressing Goal: Diagnostic test results will improve Outcome: Progressing   Problem: Coping: Goal: Level of anxiety will decrease Outcome: Progressing   

## 2024-03-29 DIAGNOSIS — F22 Delusional disorders: Secondary | ICD-10-CM | POA: Diagnosis not present
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
## 2024-03-29 NOTE — Plan of Care (Signed)
   Problem: Health Behavior/Discharge Planning: Goal: Ability to manage health-related needs will improve Outcome: Not Progressing

## 2024-03-29 NOTE — Plan of Care (Signed)

## 2024-03-29 NOTE — Group Note (Signed)
 LCSW Group Therapy Note  Group Date: 03/29/2024 Start Time: 1315 End Time: 1400   Type of Therapy and Topic:  Group Therapy: Positive Affirmations  Participation Level:  Did Not Attend   Description of Group:   This group addressed positive affirmation towards self and others.  Patients went around the room and identified two positive things about themselves and two positive things about a peer in the room.  Patients reflected on how it felt to share something positive with others, to identify positive things about themselves, and to hear positive things from others/ Patients were encouraged to have a daily reflection of positive characteristics or circumstances.   Therapeutic Goals: Patients will verbalize two of their positive qualities Patients will demonstrate empathy for others by stating two positive qualities about a peer in the group Patients will verbalize their feelings when voicing positive self affirmations and when voicing positive affirmations of others Patients will discuss the potential positive impact on their wellness/recovery of focusing on positive traits of self and others.  Summary of Patient Progress: X  Therapeutic Modalities:   Cognitive Behavioral Therapy Motivational Interviewing    [name], [place] 03/29/2024  2:14 PM

## 2024-03-29 NOTE — BH Assessment (Signed)
 Recreation Therapy Notes  INPATIENT RECREATION THERAPY ASSESSMENT  Patient Details Name: Benjamin [name] MRN: [id number] DOB: 21-Mar-1946 Today's Date: 03/29/2024   Able to Participate in Assessment/Interview: No   [name] 03/29/2024, 5:23 PM
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
## 2024-03-29 NOTE — Group Note (Signed)
 Recreation Therapy Group Note   Group Topic:Stress Management  Group Date: 03/29/2024 Start Time: 1500 End Time: 1600 Facilitators: Celestia Jeoffrey BRAVO, LRT, CTRS Location: Courtyard  Group Description: Outdoor Recreation. Patients had the option to play corn hole, ring toss, bowling or listening to music while outside in the courtyard getting fresh air and sunlight. Patients helped water and prune the raised garden beds. LRT and patients discussed things that they enjoy doing in their free time outside of the hospital. LRT encouraged patients to drink water after being active and getting their heart rate up.   Goal Area(s) Addressed: Patient will identify leisure interests.  Patient will practice healthy decision making. Patient will engage in recreation activity   Affect/Mood: Elevated   Participation Level: Hyperverbal    Clinical Observations/Individualized Feedback: Benjamin [name] was present in group. Pt was hyper verbal and very religious. Pt spoke with peers about the bible duration of session.   Plan: Continue to engage patient in RT group sessions 2-3x/week.   [address] LRT, CTRS 03/29/2024 5:22 PM
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
## 2024-03-29 NOTE — Group Note (Unsigned)
 Date:  03/29/2024 Time:  11:15 AM  Group Topic/Focus:  Dimensions of Wellness:   The focus of this group is to introduce the topic of wellness and discuss the role each dimension of wellness plays in total health.     Participation Level:  {BHH PARTICIPATION OZCZO:77735}  Participation Quality:  {BHH PARTICIPATION QUALITY:22265}  Affect:  {BHH AFFECT:22266}  Cognitive:  {BHH COGNITIVE:22267}  Insight: {BHH Insight2:20797}  Engagement in Group:  {BHH ENGAGEMENT IN HMNLE:77731}  Modes of Intervention:  {BHH MODES OF INTERVENTION:22269}  Additional Comments:  ***  Memory Heinrichs L Kimberlee Shoun 03/29/2024, 11:15 AM

## 2024-03-29 NOTE — Group Note (Signed)
 Recreation Therapy Group Note   Group Topic:Health and Wellness  Group Date: 03/29/2024 Start Time: 1100 End Time: 1135 Facilitators: Celestia Jeoffrey BRAVO, LRT, CTRS Location: Dayroom  Group Description: Seated Exercise. LRT discussed the mental and physical benefits of exercise. LRT and group discussed how physical activity can be used as a coping skill. Pt's and LRT followed along to an exercise video on the TV screen that provided a visual representation and audio description of every exercise performed. Pt's encouraged to listen to their bodies and stop at any time if they experience feelings of discomfort or pain. Pts were encouraged to drink water and stay hydrated.   Goal Area(s) Addressed: Patient will learn benefits of physical activity. Patient will identify exercise as a coping skill.  Patient will follow multistep directions. Patient will try a new leisure interest.    Affect/Mood: Euthymic   Participation Level: Non-verbal    Clinical Observations/Individualized Feedback: Benjamin [name] was present in the dayroom at the time of group. Pt did not interact with LRT or peers while in group. Pt did not complete any exercises as prompted.   Plan: Continue to engage patient in RT group sessions 2-3x/week.   Jeoffrey BRAVO Celestia, LRT, CTRS 03/29/2024 2:01 PM

## 2024-03-29 NOTE — Group Note (Signed)
 Date:  03/29/2024 Time:  4:18 AM  Group Topic/Focus:  Wrap-Up Group:   The focus of this group is to help patients review their daily goal of treatment and discuss progress on daily workbooks.    Participation Level:  Did Not Attend  Participation Quality:     Affect:     Cognitive:     Insight: None  Engagement in Group:  None  Modes of Intervention:     Additional Comments:    Benjamin [name] [name] 03/29/2024, 4:18 AM

## 2024-03-30 DIAGNOSIS — F22 Delusional disorders: Secondary | ICD-10-CM | POA: Diagnosis not present

## 2024-03-30 NOTE — Progress Notes (Signed)
 Pleasant and cooperative. Visible on the unit. Amb via w/c, self propels. States he's doing well other than people are still after him. Support and encouragement provided. Po med compliant. No behavior issues noted. Denies SI/HI/AVH. No c/o pain/discomfort noted.     03/29/24 2100  Psych Admission Type (Psych Patients Only)  Admission Status Involuntary  Psychosocial Assessment  Patient Complaints Anxiety  Eye Contact Fair  Facial Expression Animated  Affect Preoccupied  Speech Logical/coherent  Interaction Assertive  Motor Activity Unsteady  Appearance/Hygiene In scrubs  Behavior Characteristics Anxious  Mood Preoccupied  Thought Process  Coherency Loose associations  Content Paranoia  Delusions Paranoid  Perception Derealization  Hallucination None reported or observed  Judgment Impaired  Confusion Mild  Danger to Self  Current suicidal ideation? Denies

## 2024-03-30 NOTE — B[facility] Suicide Risk Assessment (Signed)
 [facility] INPATIENT:  Family/Significant Other Suicide Prevention Education  Suicide Prevention Education:  Contact Attempts: [name], borther, [phone], (name of family member/significant other) has been identified by the patient as the family member/significant other with whom the patient will be residing, and identified as the person(s) who will aid the patient in the event of a mental health crisis.  With written consent from the patient, two attempts were made to provide suicide prevention education, prior to and/or following the patient's discharge.  We were unsuccessful in providing suicide prevention education.  A suicide education pamphlet was given to the patient to share with family/significant other.  Date and time of first attempt:03/30/24/3:27 PM Date and time of second attempt: Second attempts is needed.  CSW unable to leave voicemail as voicemail box is not set up.  Sherryle JINNY Margo 03/30/2024, 3:26 PM
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
## 2024-03-30 NOTE — Progress Notes (Signed)
   03/30/24 1400  Psych Admission Type (Psych Patients Only)  Admission Status Involuntary  Psychosocial Assessment  Patient Complaints Anxiety  Eye Contact Fair  Facial Expression Animated  Affect Appropriate to circumstance  Speech Logical/coherent  Interaction Assertive  Motor Activity Unsteady  Appearance/Hygiene In scrubs  Behavior Characteristics Cooperative  Mood Preoccupied  Thought Process  Coherency Loose associations  Content Paranoia  Delusions Paranoid  Perception Derealization  Hallucination None reported or observed  Judgment Impaired  Confusion Mild  Danger to Self  Current suicidal ideation? Denies  Agreement Not to Harm Self Yes  Description of Agreement VERBAL  Danger to Others  Danger to Others None reported or observed

## 2024-03-30 NOTE — Group Note (Signed)
 Date:  03/31/2024 Time:  12:43 AM  Group Topic/Focus:  Wrap-Up Group:   The focus of this group is to help patients review their daily goal of treatment and discuss progress on daily workbooks.    Participation Level:  Active  Participation Quality:  Appropriate  Affect:  Appropriate  Cognitive:  Alert  Insight: Appropriate  Engagement in Group:  Engaged  Modes of Intervention:  Discussion  Additional Comments:    Benjamin [name] [name] 03/31/2024, 12:43 AM

## 2024-03-30 NOTE — Group Note (Signed)
 Physical/Occupational Therapy Group Note  Group Topic: Pain Management and Coping   Group Date: 03/30/2024 Start Time: 1300 End Time: 1350 Facilitators: Clive Warren CROME, OT    Group Description:  Group discussed impact of chronic/acute pain on safety and independence with functional tasks and impact on mental health.  Identified and discussed any previously learned or implemented strategies used.  Discussed and reviewed cognitive behavioral pain coping strategies to address/improve overall management of pain. Discussed relaxation, distraction techniques, cognitive restructuring, activity pacing/energy conservation, environment/home safety modifications, and role of sleep and sleep hygiene. Allowed time for questions and further discussion.   Therapeutic Goal(s):   Identify and discuss previously utilized pain coping strategies and implications of pain on function/well-being.  Identify and discuss implementing new cognitive behavioral pain coping strategies into daily routines.  Demonstrate understanding and performance of learned cognitive behavioral pain coping strategies.   Individual Participation: Pt attentive throughout, contributed verbally with prompting, and nodded in understanding several times during the session. He did not contribute when prompted to provide personal preferences for pleasant imagery but verbalized understanding of the concept. He actively participated in the progressive muscle relaxation exercise and endorsed feeling that it was helpful afterwards.   Participation Level: Active   Participation Quality: Independent   Behavior: Alert, Appropriate, Attentive , and Calm   Speech/Thought Process: Coherent, Organized, and Relevant   Affect/Mood: Appropriate and Stable    Insight: Good   Judgement: Good   Modes of Intervention: Activity, Clarification, Discussion, Education, Exploration, Problem-solving, and Socialization  Patient Response to Interventions:   Attentive, Engaged, and Receptive   Plan: Continue to engage patient in PT/OT groups 1 - 2x/week.  Shann Merrick R., MPH, MS, OTR/L ascom [phone] 03/30/24, 4:46 PM

## 2024-03-30 NOTE — Plan of Care (Signed)
   Problem: Activity: Goal: Risk for activity intolerance will decrease Outcome: Progressing   Problem: Nutrition: Goal: Adequate nutrition will be maintained Outcome: Progressing

## 2024-03-30 NOTE — Group Note (Unsigned)
 Date:  03/30/2024 Time:  11:13 AM  Group Topic/Focus:  Coping With Mental Health Crisis:   The purpose of this group is to help patients identify strategies for coping with mental health crisis.  Group discusses possible causes of crisis and ways to manage them effectively.     Participation Level:  {BHH PARTICIPATION OZCZO:77735}  Participation Quality:  {BHH PARTICIPATION QUALITY:22265}  Affect:  {BHH AFFECT:22266}  Cognitive:  {BHH COGNITIVE:22267}  Insight: {BHH Insight2:20797}  Engagement in Group:  {BHH ENGAGEMENT IN HMNLE:77731}  Modes of Intervention:  {BHH MODES OF INTERVENTION:22269}  Additional Comments:  ***  Aleesia Henney L Towana Stenglein 03/30/2024, 11:13 AM

## 2024-03-30 NOTE — BHH Counselor (Signed)
 Pt declined referral for aftercare treatment at this time.  Sherryle Margo, MSW, LCSW 03/30/2024 3:22 PM

## 2024-03-30 NOTE — Group Note (Signed)
 Recreation Therapy Group Note   Group Topic:Leisure Education  Group Date: 03/30/2024 Start Time: 1500 End Time: 1600 Facilitators: Celestia Jeoffrey BRAVO, LRT, CTRS Location: Courtyard  Group Description: Outdoor Recreation. Patients had the option to play corn hole, ring toss, bowling or listening to music while outside in the courtyard getting fresh air and sunlight. Patients helped water and prune the raised garden beds. LRT and patients discussed things that they enjoy doing in their free time outside of the hospital. LRT encouraged patients to drink water after being active and getting their heart rate up.   Goal Area(s) Addressed: Patient will identify leisure interests.  Patient will practice healthy decision making. Patient will engage in recreation activity.   Affect/Mood: N/A   Participation Level: Did not attend    Clinical Observations/Individualized Feedback: Patient did not attend group.   Plan: Continue to engage patient in RT group sessions 2-3x/week.   Jeoffrey BRAVO Celestia, LRT, CTRS 03/30/2024 5:33 PM
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
## 2024-03-31 NOTE — Group Note (Signed)
 [facility] LCSW Group Therapy Note   Group Date: 03/31/2024 Start Time: 1400 End Time: 1445   Type of Therapy/Topic:  Group Therapy:  Emotion Regulation  Participation Level:  Did Not Attend   Mood:  Description of Group:    The purpose of this group is to assist patients in learning to regulate negative emotions and experience positive emotions. Patients will be guided to discuss ways in which they have been vulnerable to their negative emotions. These vulnerabilities will be juxtaposed with experiences of positive emotions or situations, and patients challenged to use positive emotions to combat negative ones. Special emphasis will be placed on coping with negative emotions in conflict situations, and patients will process healthy conflict resolution skills.  Therapeutic Goals: Patient will identify two positive emotions or experiences to reflect on in order to balance out negative emotions:  Patient will label two or more emotions that they find the most difficult to experience:  Patient will be able to demonstrate positive conflict resolution skills through discussion or role plays:   Summary of Patient Progress:   Patient did not attend group.    Therapeutic Modalities:   Cognitive Behavioral Therapy Feelings Identification Dialectical Behavioral Therapy   Aldo HERO Desaray Marschner, LCSW

## 2024-03-31 NOTE — Progress Notes (Signed)
   03/31/24 1300  Psych Admission Type (Psych Patients Only)  Admission Status Involuntary  Psychosocial Assessment  Patient Complaints Anxiety  Eye Contact Fair  Facial Expression Animated  Affect Appropriate to circumstance  Speech Logical/coherent  Interaction Assertive  Motor Activity Unsteady  Appearance/Hygiene In scrubs  Behavior Characteristics Cooperative  Mood Pleasant  Thought Process  Coherency Loose associations  Content Paranoia  Delusions Paranoid  Perception Derealization  Hallucination None reported or observed  Judgment Impaired  Confusion Mild  Danger to Self  Current suicidal ideation? Denies  Agreement Not to Harm Self Yes  Description of Agreement VERBAL  Danger to Others  Danger to Others None reported or observed

## 2024-03-31 NOTE — Group Note (Signed)
 Date:  03/31/2024 Time:  12:46 PM  Group Topic/Focus:  Goals Group:   The focus of this group is to help patients establish daily goals to achieve during treatment and discuss how the patient can incorporate goal setting into their daily lives to aide in recovery.    Participation Level:  Minimal  Participation Quality:  Attentive  Affect:  Flat  Cognitive:  Appropriate  Insight: Limited  Engagement in Group:  Lacking and Supportive  Modes of Intervention:  Discussion, Education, Socialization, and Support  Additional Comments:  n/a  Savilla Turbyfill 03/31/2024, 12:46 PM

## 2024-03-31 NOTE — Plan of Care (Signed)
   Problem: Nutrition: Goal: Adequate nutrition will be maintained Outcome: Progressing

## 2024-03-31 NOTE — Group Note (Unsigned)
 Date:  03/31/2024 Time:  9:00 PM  Group Topic/Focus:  Self Care:   The focus of this group is to help patients understand the importance of self-care in order to improve or restore emotional, physical, spiritual, interpersonal, and financial health.     Participation Level:  {[facility] PARTICIPATION OZCZO:77735}  Participation Quality:  {[facility] PARTICIPATION QUALITY:22265}  Affect:  {[facility] AFFECT:22266}  Cognitive:  {[facility] COGNITIVE:22267}  Insight: {[facility] Insight2:20797}  Engagement in Group:  {[facility] ENGAGEMENT IN HMNLE:77731}  Modes of Intervention:  {[facility] MODES OF INTERVENTION:22269}  Additional Comments:  ***  Benjamin [name] 03/31/2024, 9:00 PM

## 2024-03-31 NOTE — [facility] Suicide Risk Assessment (Signed)
 [facility] INPATIENT:  Family/Significant Other Suicide Prevention Education  Suicide Prevention Education:  Education Completed; Benjamin [name], borther, [phone] ,  has been identified by the patient as the family member/significant other with whom the patient will be residing, and identified as the person(s) who will aid the patient in the event of a mental health crisis (suicidal ideations/suicide attempt).  With written consent from the patient, the family member/significant other has been provided the following suicide prevention education, prior to the and/or following the discharge of the patient.  The suicide prevention education provided includes the following: Suicide risk factors Suicide prevention and interventions National Suicide Hotline telephone number [facility] assessment telephone number [facility] Emergency Assistance 911 [facility] and/or Residential Mobile Crisis Unit telephone number  Request made of family/significant other to: Remove weapons (e.g., guns, rifles, knives), all items previously/currently identified as safety concern.   Remove drugs/medications (over-the-counter, prescriptions, illicit drugs), all items previously/currently identified as a safety concern.  The family member/significant other verbalizes understanding of the suicide prevention education information provided.  The family member/significant other agrees to remove the items of safety concern listed above.  The LCSWA contacted the patient brother to provide SPI. The patient brother reported that the patient does not have any guns or weapons to his knowledge. He stated that he talks with the patient over the phone sometimes but has not seen him in person in a while. The brother stated that he don't know where the brother live to be able to check in but he will do so by phone. He stated that the patient don't have family in the area but he will try to contact the patient  children.  Roselyn GORMAN Lento 03/31/2024, 2:59 PM

## 2024-03-31 NOTE — Plan of Care (Signed)
   Problem: Nutrition: Goal: Adequate nutrition will be maintained Outcome: Progressing   Problem: Elimination: Goal: Will not experience complications related to urinary retention Outcome: Progressing

## 2024-03-31 NOTE — Progress Notes (Signed)
 Patient was pleasant on approach, he spent most of the evening in the dayroom watching t.v.,, he seemed to sleep well through out the night.
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
## 2024-04-01 DIAGNOSIS — F22 Delusional disorders: Secondary | ICD-10-CM | POA: Diagnosis not present

## 2024-04-01 NOTE — Progress Notes (Signed)
 Patient was pleasant on approach, he spent most of the evening in the dayroom watching t.v.,, he seemed to sleep well through out the night. No new issues to report on shift at this time.

## 2024-04-01 NOTE — Progress Notes (Signed)
   04/01/24 1300  Psych Admission Type (Psych Patients Only)  Admission Status Involuntary  Psychosocial Assessment  Patient Complaints None  Eye Contact Fair  Facial Expression Animated  Affect Appropriate to circumstance  Speech Logical/coherent  Interaction Assertive  Motor Activity Unsteady  Appearance/Hygiene In scrubs  Behavior Characteristics Cooperative  Mood Pleasant  Thought Process  Coherency Loose associations  Content Paranoia  Delusions Paranoid  Perception Derealization  Hallucination None reported or observed  Judgment Impaired  Confusion Mild  Danger to Self  Current suicidal ideation? Denies  Agreement Not to Harm Self Yes  Description of Agreement VERBAL  Danger to Others  Danger to Others None reported or observed

## 2024-04-01 NOTE — Plan of Care (Signed)
   Problem: Coping: Goal: Level of anxiety will decrease Outcome: Progressing

## 2024-04-01 NOTE — Plan of Care (Signed)
   Problem: Nutrition: Goal: Adequate nutrition will be maintained Outcome: Progressing   Problem: Coping: Goal: Level of anxiety will decrease Outcome: Progressing

## 2024-04-01 NOTE — Progress Notes (Signed)
   04/01/24 2000  Psych Admission Type (Psych Patients Only)  Admission Status Involuntary  Psychosocial Assessment  Eye Contact Fair  Facial Expression Animated  Affect Appropriate to circumstance  Speech Logical/coherent  Interaction Assertive  Motor Activity Unsteady;Slow (uses wheelchair d/t broken toes)  Appearance/Hygiene In scrubs  Behavior Characteristics Cooperative  Mood Pleasant  Thought Process  Coherency WDL  Content WDL  Delusions None reported or observed  Perception WDL  Hallucination None reported or observed  Judgment Impaired  Confusion None  Danger to Self  Current suicidal ideation? Denies  Agreement Not to Harm Self Yes  Description of Agreement verbal  Danger to Others  Danger to Others None reported or observed   Progress note   D: Pt seen in his room. Pt denies SI, HI, AVH. Pt rates pain  1/10 in his right knee. Pt does endorse sciatica in left hip. Pt uses wheelchair when he needs it. Has post-op shoes d/t broken toes on right foot and issues with both heels. Pt is not currently wearing these shoes. Pt states that his stay has been good. Does not mention reason his feet are injured. Pt rates anxiety  0/10 and depression  0/10. No other concerns noted at this time.  A: Pt provided support and encouragement. Pt given scheduled medication as prescribed. PRNs as appropriate. Q15 min checks for safety.   R: Pt safe on the unit. Will continue to monitor.

## 2024-04-01 NOTE — Progress Notes (Signed)
 [name] MD Progress Note  04/01/2024 12:50 PM Benjamin [name]  MRN:  [id number] Benjamin [name] is a 78 year old male who presents to the inpatient geriatric psych unit after jumping from a two story building. Patient was originally seen at [facility] who referred him to the ED who admitted him here on 03/27/2024. Patient reports that people from the [place] gang were trying to kill him and the only escape was through the bedroom window on the second floor. He currently lives in the house with [name] and his family and has been living with them for 3-4 years without any problems. On 03/19/2024 he went to the doctor and when he got back home the family wouldn't let him leave. He states that after he realized the gang was going to kill him he barricaded the door with his bed and jumped out the window. When he landed he ran away until one of his neighbors found him and called 911.   Subjective:  Chart reviewed, case discussed in multidisciplinary meeting, patient seen during rounds.  On interview patient is noted to be resting in bed.  And spite of talking to this provider for the last 2 to 3 days patient continues to ask who this provider is and informs the provider that he talked to another doctor earlier.  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  He continues to display paranoid delusions about people kidnapping him and locking him away.  Per nursing report patient is taking his medications and no aggressive behaviors noted.  He denies auditory/visual hallucinations and denies SI/HI/plan. Sleep: Fair  Appetite:  Fair  Past Psychiatric History: see h&P Family History:  Family History  Problem Relation Age of Onset   Colon cancer Neg Hx    Colon polyps Neg Hx    Stomach cancer Neg Hx    Esophageal cancer Neg Hx    Social History:  Social History   Substance and Sexual Activity  Alcohol Use No     Social History   Substance and  Sexual Activity  Drug Use No    Social History   Socioeconomic History   Marital status: Single    Spouse name: Not on file   Number of children: Not on file   Years of education: Not on file   Highest education level: Not on file  Occupational History   Not on file  Tobacco Use   Smoking status: Former    Types: Cigarettes   Smokeless tobacco: Never  Vaping Use   Vaping status: Never Used  Substance and Sexual Activity   Alcohol use: No   Drug use: No   Sexual activity: Not on file  Other Topics Concern   Not on file  Social History Narrative   Not on file   Social Drivers of Health   Financial Resource Strain: Not on file  Food Insecurity: No Food Insecurity (03/27/2024)   Hunger Vital Sign    Worried About Running Out of Food in the Last Year: Never true    Ran Out of Food in the Last Year: Never true  Transportation Needs: No Transportation Needs (03/27/2024)   PRAPARE - Administrator, Civil Service (Medical): No    Lack of Transportation (Non-Medical): No  Physical Activity: Not on file  Stress: Not on file  Social Connections: Moderately Integrated (03/27/2024)   Social Connection and Isolation Panel    Frequency of Communication with Friends and  Family: Twice a week    Frequency of Social Gatherings with Friends and Family: Once a week    Attends Religious Services: 1 to 4 times per year    Active Member of [employer] or Organizations: No    Attends Engineer, structural: 1 to 4 times per year    Marital Status: Divorced   Past Medical History:  Past Medical History:  Diagnosis Date   Allergy    Arthritis    back   Prostate cancer (HCC) dx'd 2010   surg only    Past Surgical History:  Procedure Laterality Date   COLONOSCOPY     EYE SURGERY     PROSTATE SURGERY      Current Medications: Current Facility-Administered Medications  Medication Dose Route Frequency Provider Last Rate Last Admin   acetaminophen  (TYLENOL ) tablet 650 mg  650  mg Oral Q6H PRN Coleman, Carolyn H, NP       alum & mag hydroxide-simeth (MAALOX/MYLANTA) 200-200-20 MG/5ML suspension 30 mL  30 mL Oral Q4H PRN Coleman, Carolyn H, NP       docusate sodium  (COLACE) capsule 100 mg  100 mg Oral Daily Jaylin Roundy, MD   100 mg at 04/01/24 1202   magnesium  hydroxide (MILK OF MAGNESIA) suspension 30 mL  30 mL Oral Daily PRN Coleman, Carolyn H, NP       OLANZapine  (ZYPREXA ) injection 5 mg  5 mg Intramuscular TID PRN Coleman, Carolyn H, NP       OLANZapine  (ZYPREXA ) tablet 5 mg  5 mg Oral QHS Georgean Spainhower, MD   5 mg at 03/31/24 2129   OLANZapine  zydis (ZYPREXA ) disintegrating tablet 5 mg  5 mg Oral TID PRN Mardy Elveria DEL, NP        Lab Results: No results found for this or any previous visit (from the past 48 hours).  Blood Alcohol level:  Lab Results  Component Value Date   [facility] <15 03/26/2024    Metabolic Disorder Labs: No results found for: HGBA1C, MPG No results found for: PROLACTIN No results found for: CHOL, TRIG, HDL, CHOLHDL, VLDL, LDLCALC  Physical Findings: AIMS:  , ,  ,  ,    CIWA:    COWS:      Psychiatric Specialty Exam:  Presentation  General Appearance:  Appropriate for Environment; Bizarre  Eye Contact: Fair  Speech: Clear and Coherent  Speech Volume: Normal    Mood and Affect  Mood: Anxious  Affect: Congruent   Thought Process  Thought Processes: Disorganized  Descriptions of Associations:Intact  Orientation:Full (Time, Place and Person)  Thought Content:Illogical; Paranoid Ideation; Delusions  Hallucinations: Denies  Ideas of Reference:Delusions; Paranoia  Suicidal Thoughts: Denies  Homicidal Thoughts: Denies   Sensorium  Memory: Immediate Fair; Recent Fair; Remote Fair  Judgment: Impaired  Insight: Shallow   Executive Functions  Concentration: Fair  Attention Span: Fair  Recall: [employer] of Knowledge: Fair  Language: Fair   Psychomotor  Activity  Psychomotor Activity: No data recorded  Musculoskeletal: Strength & Muscle Tone: within normal limits Gait & Station: normal Assets  Assets: Manufacturing systems engineer; Resilience    Physical Exam: Physical Exam Vitals and nursing note reviewed.    ROS Blood pressure 95/62, pulse 78, temperature 97.9 F (36.6 C), resp. rate 18, height 6' (1.829 m), weight 100 kg, SpO2 99%. Body mass index is 29.91 kg/m.  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  Patient needs to be monitored closely for ongoing psychosis and paranoid delusions.   Differential diagnosis include: unspecified psychosis, delusion disorder, dementia, schizophrenia/schizoaffective disorder   Treatment Plan Summary:   Safety and Monitoring:             -- Voluntary admission to inpatient psychiatric unit for safety, stabilization and treatment             -- Daily contact with patient to assess and evaluate symptoms and progress in treatment             -- Patient's case to be discussed in multi-disciplinary team meeting             -- Observation Level: q15 minute checks             -- Vital signs:  q12 hours             -- Precautions: suicide, elopement, and assault   2. Psychiatric Diagnoses and Treatment:               Zyprexa  5 mg nightly to help with the paranoid delusions. -- The risks/benefits/side-effects/alternatives to this medication were discussed in detail with the patient and time was given for questions. The patient consents to medication trial.                -- Metabolic profile and EKG monitoring obtained while on an atypical antipsychotic (BMI: Lipid Panel: HbgA1c: QTc:)              -- Encouraged patient to participate in unit milieu and in scheduled group  therapies                            3. Medical Issues Being Addressed:    No urgent medical needs noted  4. Discharge Planning:   -- Social work and case management to assist with discharge planning and identification of hospital follow-up needs prior to discharge  -- Estimated LOS: 3-4 days  Sheray Grist, MD 04/01/2024, 12:50 PM

## 2024-04-01 NOTE — Group Note (Signed)
 Date:  04/01/2024 Time:  6:24 PM  Group Topic/Focus:  Developing a Wellness Toolbox:   The focus of this group is to help patients develop a wellness toolbox with skills and strategies to promote recovery upon discharge.    Participation Level:  Did Not Attend

## 2024-04-02 DIAGNOSIS — F22 Delusional disorders: Secondary | ICD-10-CM | POA: Diagnosis not present

## 2024-04-02 LAB — LIPID PANEL
Cholesterol: 146 mg/dL (ref 0–200)
HDL: 43 mg/dL (ref >40)
LDL Cholesterol: 96 mg/dL (ref 0–99)
Total CHOL/HDL Ratio: 3.4 ratio
Triglycerides: 37 mg/dL (ref <150)
VLDL: 7 mg/dL (ref 0–40)

## 2024-04-02 NOTE — Progress Notes (Signed)
(  Sleep Hours) - 8.25 (Any PRNs that were needed, meds refused, or side effects to meds)- none (Any disturbances and when (visitation, over night)- yelling in his sleep (Concerns raised by the patient)- none (SI/HI/AVH)- denies

## 2024-04-02 NOTE — Progress Notes (Signed)
 [facility] MD Progress Note  04/02/2024 11:45 PM Benjamin [name]  MRN:  [id number] Benjamin [name] is a 78 year old male who presents to the inpatient geriatric psych unit after jumping from a two story building. Patient was originally seen at [facility] who referred him to the ED who admitted him here on 03/27/2024. Patient reports that people from the [place] gang were trying to kill him and the only escape was through the bedroom window on the second floor. He currently lives in the house with [name] and his family and has been living with them for 3-4 years without any problems. On 03/19/2024 he went to the doctor and when he got back home the family wouldn't let him leave. He states that after he realized the gang was going to kill him he barricaded the door with his bed and jumped out the window. When he landed he ran away until one of his neighbors found him and called 911.   Subjective:  Chart reviewed, case discussed in multidisciplinary meeting, patient seen during rounds.  PaTient is noted to be resting in bed.  His mental status is improved today.  He displayed less paranoid behaviors.  He remains confused and continues to stay focused on being kidnapped and logged away by the previous people in his rental house.  He request to find a placement for him.  Provider encouraged him to reach out to his family members for additional assistance.  He denies SI/HI/plan and denies hallucinations.  Per nursing he is taking his medications with no reported side effects.      Sleep: Fair  Appetite:  Fair  Past Psychiatric History: see [name]&P Family History:  Family History  Problem Relation Age of Onset   Colon cancer Neg Hx    Colon polyps Neg Hx    Stomach cancer Neg Hx    Esophageal cancer Neg Hx    Social History:  Social History   Substance and Sexual Activity  Alcohol Use No     Social History   Substance and Sexual Activity  Drug Use No    Social History    Socioeconomic History   Marital status: Single    Spouse name: Not on file   Number of children: Not on file   Years of education: Not on file   Highest education level: Not on file  Occupational History   Not on file  Tobacco Use   Smoking status: Former    Types: Cigarettes   Smokeless tobacco: Never  Vaping Use   Vaping status: Never Used  Substance and Sexual Activity   Alcohol use: No   Drug use: No   Sexual activity: Not on file  Other Topics Concern   Not on file  Social History Narrative   Not on file   Social Drivers of Health   Financial Resource Strain: Not on file  Food Insecurity: No Food Insecurity (03/27/2024)   Hunger Vital Sign    Worried About Running Out of Food in the Last Year: Never true    Ran Out of Food in the Last Year: Never true  Transportation Needs: No Transportation Needs (03/27/2024)   PRAPARE - Administrator, Civil Service (Medical): No    Lack of Transportation (Non-Medical): No  Physical Activity: Not on file  Stress: Not on file  Social Connections: Moderately Integrated (03/27/2024)   Social Connection and Isolation Panel    Frequency of Communication with Friends and Family: Twice a week  Frequency of Social Gatherings with Friends and Family: Once a week    Attends Religious Services: 1 to 4 times per year    Active Member of [employer] or Organizations: No    Attends Engineer, structural: 1 to 4 times per year    Marital Status: Divorced   Past Medical History:  Past Medical History:  Diagnosis Date   Allergy    Arthritis    back   Prostate cancer (HCC) dx'd 2010   surg only    Past Surgical History:  Procedure Laterality Date   COLONOSCOPY     EYE SURGERY     PROSTATE SURGERY      Current Medications: Current Facility-Administered Medications  Medication Dose Route Frequency Provider Last Rate Last Admin   acetaminophen  (TYLENOL ) tablet 650 mg  650 mg Oral Q6H PRN Benjamin [name], Benjamin H, NP        alum & mag hydroxide-simeth (MAALOX/MYLANTA) 200-200-20 MG/5ML suspension 30 mL  30 mL Oral Q4H PRN Benjamin [name], Benjamin H, NP       docusate sodium  (COLACE) capsule 100 mg  100 mg Oral Daily Benjamin Scheiber, MD   100 mg at 04/02/24 9062   magnesium  hydroxide (MILK OF MAGNESIA) suspension 30 mL  30 mL Oral Daily PRN Benjamin Elveria DEL, NP       OLANZapine  (ZYPREXA ) injection 5 mg  5 mg Intramuscular TID PRN Benjamin Elveria DEL, NP       OLANZapine  (ZYPREXA ) tablet 5 mg  5 mg Oral QHS Benjamin Skillman, MD   5 mg at 04/02/24 2132   OLANZapine  zydis (ZYPREXA ) disintegrating tablet 5 mg  5 mg Oral TID PRN Benjamin Elveria DEL, NP        Lab Results:  Results for orders placed or performed during the hospital encounter of 03/27/24 (from the past 48 hours)  Lipid panel     Status: None   Collection Time: 04/02/24  7:13 AM  Result Value Ref Range   Cholesterol 146 0 - 200 mg/dL   Triglycerides 37 <849 mg/dL   HDL 43 >59 mg/dL   Total CHOL/HDL Ratio 3.4 RATIO   VLDL 7 0 - 40 mg/dL   LDL Cholesterol 96 0 - 99 mg/dL    Comment:        Total Cholesterol/HDL:CHD Risk Coronary Heart Disease Risk Table                     Men   Women  1/2 Average Risk   3.4   3.3  Average Risk       5.0   4.4  2 X Average Risk   9.6   7.1  3 X Average Risk  23.4   11.0        Use the calculated Patient Ratio above and the CHD Risk Table to determine the patient's CHD Risk.        ATP III CLASSIFICATION (LDL):  <100     mg/dL   Optimal  899-870  mg/dL   Near or Above                    Optimal  130-159  mg/dL   Borderline  839-810  mg/dL   High  >809     mg/dL   Very High Performed at [facility], [address] [place], [place] [address]     Blood Alcohol level:  Lab Results  Component Value Date   [name] <15 03/26/2024  Metabolic Disorder Labs: No results found for: HGBA1C, MPG No results found for: PROLACTIN Lab Results  Component Value Date   CHOL 146 04/02/2024   TRIG 37  04/02/2024   HDL 43 04/02/2024   CHOLHDL 3.4 04/02/2024   VLDL 7 04/02/2024   LDLCALC 96 04/02/2024    Physical Findings: AIMS:  , ,  ,  ,    CIWA:    COWS:      Psychiatric Specialty Exam:  Presentation  General Appearance:  Appropriate for Environment; Bizarre  Eye Contact: Fair  Speech: Clear and Coherent  Speech Volume: Normal    Mood and Affect  Mood: Anxious  Affect: Congruent   Thought Process  Thought Processes: Disorganized  Descriptions of Associations:Intact  Orientation:Full (Time, Place and Person)  Thought Content:Illogical; Paranoid Ideation; Delusions  Hallucinations: Denies  Ideas of Reference:Delusions; Paranoia  Suicidal Thoughts: Denies  Homicidal Thoughts: Denies   Sensorium  Memory: Immediate Fair; Recent Fair; Remote Fair  Judgment: Impaired  Insight: Shallow   Executive Functions  Concentration: Fair  Attention Span: Fair  Recall: [employer] of Knowledge: Fair  Language: Fair   Psychomotor Activity  Psychomotor Activity: No data recorded  Musculoskeletal: Strength & Muscle Tone: within normal limits Gait & Station: normal Assets  Assets: Manufacturing systems engineer; Resilience    Physical Exam: Physical Exam Vitals and nursing note reviewed.    ROS Blood pressure 104/72, pulse 82, temperature 98.2 F (36.8 C), resp. rate 14, height 6' (1.829 m), weight 100 kg, SpO2 100%. Body mass index is 29.91 kg/m.  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  Patient needs to be monitored closely for ongoing psychosis and paranoid delusions.   Differential diagnosis include: unspecified psychosis, delusion disorder, dementia, schizophrenia/schizoaffective disorder   Treatment  Plan Summary:   Safety and Monitoring:             -- Voluntary admission to inpatient psychiatric unit for safety, stabilization and treatment             -- Daily contact with patient to assess and evaluate symptoms and progress in treatment             -- Patient's case to be discussed in multi-disciplinary team meeting             -- Observation Level: q15 minute checks             -- Vital signs:  q12 hours             -- Precautions: suicide, elopement, and assault   2. Psychiatric Diagnoses and Treatment:               Zyprexa  5 mg nightly to help with the paranoid delusions. -- The risks/benefits/side-effects/alternatives to this medication were discussed in detail with the patient and time was given for questions. The patient consents to medication trial.                -- Metabolic profile and EKG monitoring obtained while on an atypical antipsychotic (BMI: Lipid Panel: HbgA1c: QTc:)              -- Encouraged patient to participate in unit milieu and in scheduled group therapies  3. Medical Issues Being Addressed:    No urgent medical needs noted  4. Discharge Planning:   -- Social work and case management to assist with discharge planning and identification of hospital follow-up needs prior to discharge  -- Estimated LOS: 3-4 days  Swain Acree, MD 04/02/2024, 11:45 PM

## 2024-04-02 NOTE — Group Note (Signed)
 Recreation Therapy Group Note   Group Topic:Coping Skills  Group Date: 04/02/2024 Start Time: 1350 End Time: 1450 Facilitators: Celestia Jeoffrey BRAVO, LRT, CTRS Location: Courtyard  Group Description: Music. Patients encouraged to name their favorite song(s) for LRT to play song through speaker for group to hear, while in the courtyard getting fresh air and sunlight. Patients educated on the definition of leisure and the importance of having different leisure interests outside of the hospital. Group discussed how leisure activities can often be used as Pharmacologist and that listening to music and being outside are examples.    Goal Area(s) Addressed:  Patient will identify a current leisure interest.  Patient will practice making a positive decision. Patient will have the opportunity to try a new leisure activity.   Affect/Mood: Appropriate   Participation Level: Active and Engaged   Participation Quality: Independent   Behavior: Appropriate, Calm, and Cooperative   Speech/Thought Process: Coherent   Insight: Good   Judgement: Good   Modes of Intervention: Guided Discussion and Music   Patient Response to Interventions:  Attentive, Engaged, Interested , and Receptive   Education Outcome:  Acknowledges education   Clinical Observations/Individualized Feedback: Darel was active in their participation of session activities and group discussion. Pt identified I like all music, really. Pt interacted well with LRT and peers duration of session.    Plan: Continue to engage patient in RT group sessions 2-3x/week.   Jeoffrey BRAVO Celestia, LRT, CTRS 04/02/2024 5:14 PM

## 2024-04-02 NOTE — Progress Notes (Signed)
 Patient presents: Cooperative and med compliant. Patient engaged during group and socializing adequately. Patient also observed ambulating throughout day with minimal assistance.   SI/HI/AVH: Denies   Plan: Denies   Groups attended: 2/2   Appetite: Adequate. Attended meals.   Sleep: Patient reported having a nightmare last night otherwise slept fine.    PRNS: N/A   Disturbances: No disturbances. Patient remains cooperative in milieu.    Questions/concerns: No further questions or concerns.   VS: BP 108/78 (BP Location: Right Arm)   Pulse 76   Temp 98.5 F (36.9 C)   Resp 16   Ht 6' (1.829 m)   Wt 100 kg   SpO2 99%   BMI 29.91 kg/m

## 2024-04-02 NOTE — Plan of Care (Signed)
  Problem: Health Behavior/Discharge Planning: Goal: Ability to manage health-related needs will improve Outcome: Progressing   Problem: Clinical Measurements: Goal: Ability to maintain clinical measurements within normal limits will improve Outcome: Progressing Goal: Will remain free from infection Outcome: Progressing Goal: Diagnostic test results will improve Outcome: Progressing   Problem: Activity: Goal: Risk for activity intolerance will decrease Outcome: Progressing   Problem: Coping: Goal: Level of anxiety will decrease Outcome: Progressing   Problem: Safety: Goal: Ability to remain free from injury will improve Outcome: Progressing

## 2024-04-02 NOTE — BH IP Treatment Plan (Signed)
 Interdisciplinary Treatment and Diagnostic Plan Update  04/02/2024 Time of Session: 3:00 PM Benjamin [name] MRN: [id number]  Principal Diagnosis: Delusional disorder [facility])  Secondary Diagnoses: Principal Problem:   Delusional disorder (HCC)   Current Medications:  Current Facility-Administered Medications  Medication Dose Route Frequency Provider Last Rate Last Admin   acetaminophen  (TYLENOL ) tablet 650 mg  650 mg Oral Q6H PRN Coleman, Carolyn H, NP       alum & mag hydroxide-simeth (MAALOX/MYLANTA) 200-200-20 MG/5ML suspension 30 mL  30 mL Oral Q4H PRN Coleman, Carolyn H, NP       docusate sodium  (COLACE) capsule 100 mg  100 mg Oral Daily Jadapalle, Sree, MD   100 mg at 04/02/24 9062   magnesium  hydroxide (MILK OF MAGNESIA) suspension 30 mL  30 mL Oral Daily PRN Coleman, Carolyn H, NP       OLANZapine  (ZYPREXA ) injection 5 mg  5 mg Intramuscular TID PRN Coleman, Carolyn H, NP       OLANZapine  (ZYPREXA ) tablet 5 mg  5 mg Oral QHS Jadapalle, Sree, MD   5 mg at 04/01/24 2152   OLANZapine  zydis (ZYPREXA ) disintegrating tablet 5 mg  5 mg Oral TID PRN Coleman, Carolyn H, NP       PTA Medications: Medications Prior to Admission  Medication Sig Dispense Refill Last Dose/Taking   docusate sodium  (COLACE) 100 MG capsule Take 100 mg by mouth daily.      omega-3 acid ethyl esters (LOVAZA) 1 g capsule Take 1 g by mouth daily.       Patient Stressors: Traumatic event    Patient Strengths: Communication skills   Treatment Modalities: Medication Management, Group therapy, Case management,  1 to 1 session with clinician, Psychoeducation, Recreational therapy.   Physician Treatment Plan for Primary Diagnosis: Delusional disorder [facility]) Long Term Goal(s): Improvement in symptoms so as ready for discharge   Short Term Goals: Ability to identify changes in lifestyle to reduce recurrence of condition will improve Ability to verbalize feelings will improve Ability to disclose and discuss suicidal  ideas Ability to demonstrate self-control will improve Ability to identify and develop effective coping behaviors will improve  Medication Management: Evaluate patient's response, side effects, and tolerance of medication regimen.  Therapeutic Interventions: 1 to 1 sessions, Unit Group sessions and Medication administration.  Evaluation of Outcomes: Not Progressing  Physician Treatment Plan for Secondary Diagnosis: Principal Problem:   Delusional disorder (HCC)  Long Term Goal(s): Improvement in symptoms so as ready for discharge   Short Term Goals: Ability to identify changes in lifestyle to reduce recurrence of condition will improve Ability to verbalize feelings will improve Ability to disclose and discuss suicidal ideas Ability to demonstrate self-control will improve Ability to identify and develop effective coping behaviors will improve     Medication Management: Evaluate patient's response, side effects, and tolerance of medication regimen.  Therapeutic Interventions: 1 to 1 sessions, Unit Group sessions and Medication administration.  Evaluation of Outcomes: Not Progressing   RN Treatment Plan for Primary Diagnosis: Delusional disorder (HCC) Long Term Goal(s): Knowledge of disease and therapeutic regimen to maintain health will improve  Short Term Goals: Ability to remain free from injury will improve, Ability to verbalize frustration and anger appropriately will improve, Ability to demonstrate self-control, Ability to participate in decision making will improve, Ability to verbalize feelings will improve, Ability to disclose and discuss suicidal ideas, Ability to identify and develop effective coping behaviors will improve, and Compliance with prescribed medications will improve  Medication Management: RN will administer  medications as ordered by provider, will assess and evaluate patient's response and provide education to patient for prescribed medication. RN will report  any adverse and/or side effects to prescribing provider.  Therapeutic Interventions: 1 on 1 counseling sessions, Psychoeducation, Medication administration, Evaluate responses to treatment, Monitor vital signs and CBGs as ordered, Perform/monitor CIWA, COWS, AIMS and Fall Risk screenings as ordered, Perform wound care treatments as ordered.  Evaluation of Outcomes: Not Progressing   LCSW Treatment Plan for Primary Diagnosis: Delusional disorder [facility]) Long Term Goal(s): Safe transition to appropriate next level of care at discharge, Engage patient in therapeutic group addressing interpersonal concerns.  Short Term Goals: Engage patient in aftercare planning with referrals and resources, Increase social support, Increase ability to appropriately verbalize feelings, Increase emotional regulation, Facilitate acceptance of mental health diagnosis and concerns, Facilitate patient progression through stages of change regarding substance use diagnoses and concerns, Identify triggers associated with mental health/substance abuse issues, and Increase skills for wellness and recovery  Therapeutic Interventions: Assess for all discharge needs, 1 to 1 time with Social worker, Explore available resources and support systems, Assess for adequacy in community support network, Educate family and significant other(s) on suicide prevention, Complete Psychosocial Assessment, Interpersonal group therapy.  Evaluation of Outcomes: Not Progressing   Progress in Treatment: Attending groups: Yes. and No. Participating in groups: Yes. and No. Taking medication as prescribed: Yes. Toleration medication: Yes. Family/Significant other contact made: No, will contact:  CSW will contact if given permission  Patient understands diagnosis: Yes. Discussing patient identified problems/goals with staff: Yes. Medical problems stabilized or resolved: Yes. Denies suicidal/homicidal ideation: Yes. Issues/concerns per patient  self-inventory: No. Other: None   New problem(s) identified: No, Describe:  None identified Update 04/02/24: No changes at this time    New Short Term/Long Term Goal(s): elimination of symptoms of psychosis, medication management for mood stabilization; elimination of SI thoughts; development of comprehensive mental wellness plan. Update 04/02/24: No changes at this time    Patient Goals:  Get out of here, I want to get healed up so I can get out of here Update 04/02/24: No changes at this time   Discharge Plan or Barriers: CSW will assist with appropriate discharge planning  Update 04/02/24: No changes at this time    Reason for Continuation of Hospitalization: Delusions  Medication stabilization   Estimated Length of Stay: 1 to 7 days Update 04/02/24: TBD  Last 3 [place] Suicide Severity Risk Score: Flowsheet Row Admission (Current) from 03/27/2024 in [name] ED from 03/26/2024 in [facility] Emergency Department at [facility] ED from 03/25/2024 in [facility]  C-SSRS RISK CATEGORY No Risk No Risk No Risk    Last [facility] 2/9 Scores:     No data to display          Scribe for Treatment Team: [name], ISRAEL 04/02/2024 9:12 PM

## 2024-04-02 NOTE — Group Note (Signed)
 Date:  04/02/2024 Time:  9:08 PM  Group Topic/Focus:  Wrap-Up Group:   The focus of this group is to help patients review their daily goal of treatment and discuss progress on daily workbooks.    Participation Level:  Active  Participation Quality:  Appropriate  Affect:  Appropriate  Cognitive:  Alert  Insight: Appropriate  Engagement in Group:  Engaged  Modes of Intervention:  Discussion  Additional Comments:    Benjamin [name] 04/02/2024, 9:08 PM

## 2024-04-02 NOTE — Group Note (Signed)
 Date:  04/02/2024 Time:  11:20 PM  Group Topic/Focus:  Wrap-Up Group:   The focus of this group is to help patients review their daily goal of treatment and discuss progress on daily workbooks.    Participation Level:  Did Not Attend  Participation Quality:     Affect:     Cognitive:     Insight: None  Engagement in Group:  None  Modes of Intervention:     Additional Comments:    Benjamin [name] Benjamin [name] 04/02/2024, 11:20 PM

## 2024-04-02 NOTE — Plan of Care (Signed)
  Problem: Safety: Goal: Ability to remain free from injury will improve Outcome: Progressing   Problem: Activity: Goal: Interest or engagement in activities will improve Outcome: Progressing   Problem: Health Behavior/Discharge Planning: Goal: Compliance with treatment plan for underlying cause of condition will improve Outcome: Progressing

## 2024-04-02 NOTE — Group Note (Signed)
 Date:  04/02/2024 Time:  12:48 PM  Group Topic/Focus:  Wellness Toolbox:   The focus of this group is to discuss various aspects of wellness, balancing those aspects and exploring ways to increase the ability to experience wellness.  Patients will create a wellness toolbox for use upon discharge.    Participation Level:  Active  Participation Quality:  Appropriate  Affect:  Appropriate  Cognitive:  Appropriate  Insight: Appropriate  Engagement in Group:  Engaged  Modes of Intervention:  Activity  Additional Comments:    Anabela Crayton 04/02/2024, 12:48 PM

## 2024-04-03 DIAGNOSIS — F22 Delusional disorders: Secondary | ICD-10-CM | POA: Diagnosis not present

## 2024-04-03 LAB — HEMOGLOBIN A1C
Hgb A1c MFr Bld: 5.6 % (ref 4.8–5.6)
Mean Plasma Glucose: 114 mg/dL

## 2024-04-03 NOTE — Group Note (Signed)
 Date:  04/03/2024 Time:  11:05 AM  Group Topic/Focus:  Gratitude Group The purpose of this group is a exercise that goes over things they are grateful discussing some facts about their lives and how can they improve their positive thinking.    Participation Level:  Did Not Attend  Beatris ONEIDA Hasten 04/03/2024, 11:05 AM

## 2024-04-03 NOTE — Group Note (Signed)
 Physical/Occupational Therapy Group Note  Group Topic: Functional, Dynamic Balance   Group Date: 04/03/2024 Start Time: 1530 End Time: 1600 Facilitators: Shrihan Putt, Alm Hamilton, PT   Group Description: Group discussed impact of balance on safety and independence with functional tasks.  Identified and discussed any self-perceived balance deficits to personalize information.  Discussed and reviewed strategies to address/improve balance deficits: use of assist devices, activity pacing/energy conservation, environment/home safety modifications, focusing attention/minimizing distraction.  Reviewed and participated with standing LE therex designed to target dynamic balance reactions and LE strength/stability; provided handouts with HEP to be utilized outside of group time as appropriate.  Allowed time for questions and further discussion on any balance or mobility concerns/needs.  Therapeutic Goal(s):  Identify and discuss any individual balance deficits and functional implications. Identify and discuss any environmental/home safety modifications that can optimize balance and safety for mobility within the home. Demonstrate understanding and performance of standing therex designed to target dynamic balance deficits.  Individual Participation: Pt actively participated with the discussion and physical activity components of the session.  Pt was generally steady with standing therex/balance activities with no overt LOB noted.   Participation Level: Active and Engaged   Participation Quality: Minimal Cues   Behavior: Appropriate   Speech/Thought Process:  Organized, relevant    Affect/Mood: Appropriate   Insight: Moderate   Judgement: Moderate   Modes of Intervention: Activity, Discussion, and Education  Patient Response to Interventions:  Attentive, Engaged, Interested , and Receptive   Plan: Continue to engage patient in PT/OT groups 1 - 2x/week.  CHARM Hamilton Bertin PT, DPT 04/03/24, 4:42  PM

## 2024-04-03 NOTE — Plan of Care (Signed)
  Problem: Safety: Goal: Ability to remain free from injury will improve Outcome: Progressing   Problem: Activity: Goal: Interest or engagement in activities will improve Outcome: Progressing   Problem: Health Behavior/Discharge Planning: Goal: Compliance with treatment plan for underlying cause of condition will improve Outcome: Progressing

## 2024-04-03 NOTE — Progress Notes (Signed)
 Patient presents: Cooperative and med compliant although when taking medication this morning appeared slightly suspicious. Patient observed looking at the medication tablet and also looking into the cup of water after each sip to make sure there was nothing there. Patient engaged during group and appropriate in milieu.     SI/HI/AVH: Denies   Plan: Denies   Groups attended: 2/3   Appetite: Adequate. Attended meals.   Sleep: No sleep disturbances reported.   PRNS: N/A   Disturbances: No disturbances. Patient remains cooperative in milieu.    Questions/concerns: No further questions or concerns.   VS: BP 136/71 (BP Location: Right Arm)   Pulse 77   Temp 98.1 F (36.7 C)   Resp 16   Ht 6' (1.829 m)   Wt 100 kg   SpO2 100%   BMI 29.91 kg/m

## 2024-04-03 NOTE — Plan of Care (Signed)
   Problem: Education: Goal: Emotional status will improve Outcome: Progressing Goal: Mental status will improve Outcome: Progressing
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
## 2024-04-03 NOTE — Group Note (Signed)
 Date:  04/03/2024 Time:  9:13 PM  Group Topic/Focus:  Wrap-Up Group:   The focus of this group is to help patients review their daily goal of treatment and discuss progress on daily workbooks.    Participation Level:  Did Not Attend  Participation Quality:     Affect:     Cognitive:     Insight: None  Engagement in Group:  None  Modes of Intervention:     Additional Comments:    [name] 04/03/2024, 9:13 PM

## 2024-04-03 NOTE — Progress Notes (Signed)
   04/02/24 2230  Psych Admission Type (Psych Patients Only)  Admission Status Involuntary  Psychosocial Assessment  Patient Complaints None  Eye Contact Fair  Facial Expression Anxious  Affect Appropriate to circumstance  Speech Logical/coherent  Interaction Assertive  Motor Activity Slow  Appearance/Hygiene In scrubs  Behavior Characteristics Cooperative  Mood Pleasant  Thought Process  Coherency Circumstantial  Content Ambivalence  Delusions None reported or observed  Perception Derealization  Hallucination None reported or observed  Judgment Poor  Confusion None  Danger to Self  Current suicidal ideation? Denies  Agreement Not to Harm Self Yes  Description of Agreement Verbal  Danger to Others  Danger to Others None reported or observed

## 2024-04-03 NOTE — Progress Notes (Addendum)
   04/03/24 1127  Spiritual Encounters  Type of Visit Follow up  Care provided to: Patient  Referral source Chaplain assessment  Reason for visit Routine spiritual support  OnCall Visit No   While rounding on unit, I provided follow up support to Mr. Benjamin [name].  [name] was seated in dayroom and welcomed my visit. He processed some reflections on his [place] faith. 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 His affect was positive and calm. He asked for ongoing prayer.  I provided compassionate presence and relational support. I invited reflection on the aspects of faith that [name] named, encouraging him to reflect on how he is making meaning in the here and now. I affirmed his sense of calm and acknowledged that he is facing some uncertainty. I encouraged him in ways that he draws strength and encouragement from his faith. Will continue to follow.  (Follow up: returned to offer a New Testament/Psalms at patient request, 1230)  Kendal Ghazarian L. Fredrica, M.Div [phone]

## 2024-04-03 NOTE — Group Note (Signed)
 Recreation Therapy Group Note   Group Topic:Other  Group Date: 04/03/2024 Start Time: 1400 End Time: 1500 Facilitators: Celestia Jeoffrey BRAVO, LRT, CTRS Location: Courtyard  Group Description: Outdoor Recreation. Patients had the option to play corn hole, ring toss, bowling or listening to music while outside in the courtyard getting fresh air and sunlight. Patients helped water and prune the raised garden beds. LRT and patients discussed things that they enjoy doing in their free time outside of the hospital. LRT encouraged patients to drink water after being active and getting their heart rate up.   Goal Area(s) Addressed: Patient will identify leisure interests.  Patient will practice healthy decision making. Patient will engage in recreation activity.   Affect/Mood: Appropriate   Participation Level: Active and Engaged   Participation Quality: Independent   Behavior: Appropriate, Calm, and Cooperative   Speech/Thought Process: Coherent   Insight: Good   Judgement: Good   Modes of Intervention: Music, Open Conversation, Rapport Building, and Socialization   Patient Response to Interventions:  Attentive, Engaged, Interested , and Receptive   Education Outcome:  Acknowledges education   Clinical Observations/Individualized Feedback: Henrry was active in their participation of session activities and group discussion. Pt identified I havent heard some of these songs in forever. I use to sing all the time. Pt interacted well with LRT and peers duration of session.    Plan: Continue to engage patient in RT group sessions 2-3x/week.   Jeoffrey BRAVO Celestia, LRT, CTRS 04/03/2024 4:42 PM
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
## 2024-04-04 DIAGNOSIS — F22 Delusional disorders: Secondary | ICD-10-CM | POA: Diagnosis not present

## 2024-04-04 NOTE — Plan of Care (Signed)
   Problem: Coping: Goal: Ability to verbalize frustrations and anger appropriately will improve Outcome: Progressing

## 2024-04-04 NOTE — Group Note (Signed)
 Date:  04/04/2024 Time:  9:31 PM  Group Topic/Focus:  Identifying Needs:   The focus of this group is to help patients identify their personal needs that have been historically problematic and identify healthy behaviors to address their needs.    Participation Level:  Active  Participation Quality:  Appropriate  Affect:  Appropriate  Cognitive:  Appropriate  Insight: Appropriate  Engagement in Group:  Engaged  Modes of Intervention:  Education and Exploration  Additional Comments:    Laymon [name] 04/04/2024, 9:31 PM

## 2024-04-04 NOTE — Plan of Care (Signed)
   Problem: Clinical Measurements: Goal: Will remain free from infection Outcome: Progressing Goal: Diagnostic test results will improve Outcome: Progressing   Problem: Activity: Goal: Risk for activity intolerance will decrease Outcome: Progressing   Problem: Nutrition: Goal: Adequate nutrition will be maintained Outcome: Progressing   Problem: Elimination: Goal: Will not experience complications related to bowel motility Outcome: Progressing   Problem: Safety: Goal: Ability to remain free from injury will improve Outcome: Progressing

## 2024-04-04 NOTE — Progress Notes (Signed)
(  Sleep Hours) - 8.25 (Any PRNs that were needed, meds refused, or side effects to meds)- none (Any disturbances and when (visitation, over night)- pt calling out in his sleep (Concerns raised by the patient)- Pt concerned about housing once he is discharged. Treatment team sticky note written. Pt cannot return to previous residence. (SI/HI/AVH)- denies
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
## 2024-04-04 NOTE — Group Note (Unsigned)
 Date:  04/04/2024 Time:  2:03 PM  Group Topic/Focus:  Managing Feelings:   The focus of this group is to identify what feelings patients have difficulty handling and develop a plan to handle them in a healthier way upon discharge.     Participation Level:  {BHH PARTICIPATION OZCZO:77735}  Participation Quality:  {BHH PARTICIPATION QUALITY:22265}  Affect:  {BHH AFFECT:22266}  Cognitive:  {BHH COGNITIVE:22267}  Insight: {BHH Insight2:20797}  Engagement in Group:  {BHH ENGAGEMENT IN HMNLE:77731}  Modes of Intervention:  {BHH MODES OF INTERVENTION:22269}  Additional Comments:  ***  [name] Gavel 04/04/2024, 2:03 PM

## 2024-04-04 NOTE — Progress Notes (Signed)
   04/04/24 1800  Psych Admission Type (Psych Patients Only)  Admission Status Involuntary  Psychosocial Assessment  Patient Complaints Anxiety  Eye Contact Fair  Facial Expression Anxious  Affect Appropriate to circumstance  Speech Logical/coherent  Interaction Minimal  Motor Activity Slow  Appearance/Hygiene In scrubs;Unremarkable  Behavior Characteristics Cooperative  Mood Pleasant  Thought Process  Coherency Circumstantial  Content Paranoia  Delusions Paranoid  Perception WDL  Hallucination None reported or observed  Judgment Impaired  Confusion Mild  Danger to Self  Current suicidal ideation? Denies  Agreement Not to Harm Self Yes  Description of Agreement verbal  Danger to Others  Danger to Others None reported or observed
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
## 2024-04-05 DIAGNOSIS — F22 Delusional disorders: Secondary | ICD-10-CM | POA: Diagnosis not present

## 2024-04-05 NOTE — Plan of Care (Signed)
   Problem: Coping: Goal: Level of anxiety will decrease Outcome: Progressing

## 2024-04-05 NOTE — Group Note (Unsigned)
 Date:  04/05/2024 Time:  10:51 AM  Group Topic/Focus:  Dimensions of Wellness:   The focus of this group is to introduce the topic of wellness and discuss the role each dimension of wellness plays in total health.     Participation Level:  {BHH PARTICIPATION OZCZO:77735}  Participation Quality:  {BHH PARTICIPATION QUALITY:22265}  Affect:  {BHH AFFECT:22266}  Cognitive:  {BHH COGNITIVE:22267}  Insight: {BHH Insight2:20797}  Engagement in Group:  {BHH ENGAGEMENT IN HMNLE:77731}  Modes of Intervention:  {BHH MODES OF INTERVENTION:22269}  Additional Comments:  ***  [name] Gavel 04/05/2024, 10:51 AM

## 2024-04-05 NOTE — Plan of Care (Signed)
   Problem: Education: Goal: Knowledge of [place] [facility] Education information/materials will improve Outcome: Progressing

## 2024-04-05 NOTE — Progress Notes (Signed)
 [facility] MD Progress Note  04/05/2024 11:12 PM Benjamin [name]  MRN:  [id number] Benjamin [name] is a 78 year old male who presents to the inpatient geriatric psych unit after jumping from a two story building. Patient was originally seen at [name] who referred him to the ED who admitted him here on 03/27/2024. Patient reports that people from the [place] gang were trying to kill him and the only escape was through the bedroom window on the second floor. He currently lives in the house with [name] and his family and has been living with them for 3-4 years without any problems. On 03/19/2024 he went to the doctor and when he got back home the family wouldn't let him leave. He states that after he realized the gang was going to kill him he barricaded the door with his bed and jumped out the window. When he landed he ran away until one of his neighbors found him and called 911.   Subjective:  Chart reviewed, case discussed in multidisciplinary meeting, patient seen during rounds.  Patient on interview is noted to be sitting in the day area.  Patient denies SI/HI/plan and denies hallucinations.  He remains focused on discharge and wanting to have a place to go to.  He is aware that the social worker will be reaching out to his brother and work on options. Sleep: Fair  Appetite:  Fair  Past Psychiatric History: see h&P  Family History:  Family History  Problem Relation Age of Onset   Colon cancer Neg Hx    Colon polyps Neg Hx    Stomach cancer Neg Hx    Esophageal cancer Neg Hx    Social History:  Social History   Substance and Sexual Activity  Alcohol Use No     Social History   Substance and Sexual Activity  Drug Use No    Social History   Socioeconomic History   Marital status: Single    Spouse name: Not on file   Number of children: Not on file   Years of education: Not on file   Highest education level: Not on file  Occupational History   Not on file  Tobacco  Use   Smoking status: Former    Types: Cigarettes   Smokeless tobacco: Never  Vaping Use   Vaping status: Never Used  Substance and Sexual Activity   Alcohol use: No   Drug use: No   Sexual activity: Not on file  Other Topics Concern   Not on file  Social History Narrative   Not on file   Social Drivers of Health   Financial Resource Strain: Not on file  Food Insecurity: No Food Insecurity (03/27/2024)   Hunger Vital Sign    Worried About Running Out of Food in the Last Year: Never true    Ran Out of Food in the Last Year: Never true  Transportation Needs: No Transportation Needs (03/27/2024)   PRAPARE - Administrator, Civil Service (Medical): No    Lack of Transportation (Non-Medical): No  Physical Activity: Not on file  Stress: Not on file  Social Connections: Moderately Integrated (03/27/2024)   Social Connection and Isolation Panel    Frequency of Communication with Friends and Family: Twice a week    Frequency of Social Gatherings with Friends and Family: Once a week    Attends Religious Services: 1 to 4 times per year    Active Member of Clubs or Organizations: No    Attends  Club or Organization Meetings: 1 to 4 times per year    Marital Status: Divorced   Past Medical History:  Past Medical History:  Diagnosis Date   Allergy    Arthritis    back   Prostate cancer (HCC) dx'd 2010   surg only    Past Surgical History:  Procedure Laterality Date   COLONOSCOPY     EYE SURGERY     PROSTATE SURGERY      Current Medications: Current Facility-Administered Medications  Medication Dose Route Frequency Provider Last Rate Last Admin   acetaminophen  (TYLENOL ) tablet 650 mg  650 mg Oral Q6H PRN Coleman, Carolyn H, NP       alum & mag hydroxide-simeth (MAALOX/MYLANTA) 200-200-20 MG/5ML suspension 30 mL  30 mL Oral Q4H PRN Coleman, Carolyn H, NP       docusate sodium  (COLACE) capsule 100 mg  100 mg Oral Daily Jackie Russman, MD   100 mg at 04/05/24 9078    magnesium  hydroxide (MILK OF MAGNESIA) suspension 30 mL  30 mL Oral Daily PRN Coleman, Carolyn H, NP       OLANZapine  (ZYPREXA ) injection 5 mg  5 mg Intramuscular TID PRN Coleman, Carolyn H, NP       OLANZapine  (ZYPREXA ) tablet 5 mg  5 mg Oral QHS Jshaun Abernathy, MD   5 mg at 04/05/24 2129   OLANZapine  zydis (ZYPREXA ) disintegrating tablet 5 mg  5 mg Oral TID PRN Mardy Elveria DEL, NP        Lab Results:  No results found for this or any previous visit (from the past 48 hours).   Blood Alcohol level:  Lab Results  Component Value Date   [facility] <15 03/26/2024    Metabolic Disorder Labs: Lab Results  Component Value Date   HGBA1C 5.6 04/02/2024   MPG 114 04/02/2024   No results found for: PROLACTIN Lab Results  Component Value Date   CHOL 146 04/02/2024   TRIG 37 04/02/2024   HDL 43 04/02/2024   CHOLHDL 3.4 04/02/2024   VLDL 7 04/02/2024   LDLCALC 96 04/02/2024    Physical Findings: AIMS:  , ,  ,  ,    CIWA:    COWS:      Psychiatric Specialty Exam:  Presentation  General Appearance:  Appropriate for Environment; Bizarre  Eye Contact: Fair  Speech: Clear and Coherent  Speech Volume: Normal    Mood and Affect  Mood: Anxious  Affect: Congruent   Thought Process  Thought Processes: Disorganized  Descriptions of Associations:Intact  Orientation:Full (Time, Place and Person)  Thought Content:Illogical; Paranoid Ideation; Delusions  Hallucinations: Denies  Ideas of Reference:Delusions; Paranoia  Suicidal Thoughts: Denies  Homicidal Thoughts: Denies   Sensorium  Memory: Immediate Fair; Recent Fair; Remote Fair  Judgment: Impaired  Insight: Shallow   Executive Functions  Concentration: Fair  Attention Span: Fair  Recall: [employer] of Knowledge: Fair  Language: Fair   Psychomotor Activity  Psychomotor Activity: No data recorded  Musculoskeletal: Strength & Muscle Tone: within normal limits Gait & Station:  normal Assets  Assets: Manufacturing systems engineer; Resilience    Physical Exam: Physical Exam Vitals and nursing note reviewed.    ROS Blood pressure 112/74, pulse 75, temperature 97.6 F (36.4 C), resp. rate (!) 22, height 6' (1.829 m), weight 100 kg, SpO2 100%. Body mass index is 29.91 kg/m.  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  Patient needs to be monitored closely for ongoing psychosis and paranoid delusions.   Differential diagnosis include: unspecified psychosis, delusion disorder, dementia, schizophrenia/schizoaffective disorder   Treatment Plan Summary:   Safety and Monitoring:             -- Voluntary admission to inpatient psychiatric unit for safety, stabilization and treatment             -- Daily contact with patient to assess and evaluate symptoms and progress in treatment             -- Patient's case to be discussed in multi-disciplinary team meeting             -- Observation Level: q15 minute checks             -- Vital signs:  q12 hours             -- Precautions: suicide, elopement, and assault   2. Psychiatric Diagnoses and Treatment:               Zyprexa  5 mg nightly to help with the paranoid delusions. -- The risks/benefits/side-effects/alternatives to this medication were discussed in detail with the patient and time was given for questions. The patient consents to medication trial.                -- Metabolic profile and EKG monitoring obtained while on an atypical antipsychotic (BMI: Lipid Panel: HbgA1c: QTc:)              -- Encouraged patient to participate in unit milieu and in scheduled group therapies                            3. Medical Issues Being Addressed:    No urgent medical needs noted  4. Discharge  Planning:   -- Social work and case management to assist with discharge planning and identification of hospital follow-up needs prior to discharge  -- Estimated LOS: 3-4 days  Allyn Foil, MD 04/05/2024, 11:12 PM

## 2024-04-05 NOTE — Progress Notes (Signed)
 Patient was cooperative with treatment on the shift.  He was compliant with medications. He denies SI, HI & AVH. No new issues to report on shift at this time.

## 2024-04-05 NOTE — Progress Notes (Signed)
 Estimated Sleeping Duration (Last 24 Hours): 5.25-6.75 hours   (Sleep Hours) - 5.25-6.75 hrs (Any PRNs that were needed, meds refused, or side effects to meds)- none  (Any disturbances and when (visitation, over night)- none (Concerns raised by the patient)-  (SI/HI/AVH)- denies all

## 2024-04-05 NOTE — Group Note (Signed)
 Recreation Therapy Group Note   Group Topic:Relaxation  Group Date: 04/05/2024 Start Time: 1400 End Time: 1500 Facilitators: Celestia Jeoffrey BRAVO, LRT, CTRS Location: Courtyard  Group Description: Music. Patients encouraged to name their favorite song(s) for LRT to play song through speaker for group to hear, while in the courtyard getting fresh air and sunlight. Patients educated on the definition of leisure and the importance of having different leisure interests outside of the hospital. Group discussed how leisure activities can often be used as Pharmacologist and that listening to music and being outside are examples.    Goal Area(s) Addressed:  Patient will identify a current leisure interest.  Patient will practice making a positive decision. Patient will have the opportunity to try a new leisure activity.   Affect/Mood: Appropriate   Participation Level: Active and Engaged   Participation Quality: Independent   Behavior: Appropriate, Calm, and Cooperative   Speech/Thought Process: Coherent   Insight: Good   Judgement: Good   Modes of Intervention: Music, Open Conversation, Rapport Building, and Socialization   Patient Response to Interventions:  Attentive, Engaged, Interested , and Receptive   Education Outcome:  Acknowledges education   Clinical Observations/Individualized Feedback: Benjamin [name] was active in their participation of session activities and group discussion. Pt shared that he was happy to get outside and feel the warm sun and air. Pt interacted well with LRT and peers duration of session.   Plan: Continue to engage patient in RT group sessions 2-3x/week.   Jeoffrey BRAVO Celestia, LRT, CTRS 04/05/2024 5:35 PM

## 2024-04-06 NOTE — Plan of Care (Signed)
   Problem: Coping: Goal: Level of anxiety will decrease Outcome: Progressing

## 2024-04-06 NOTE — Plan of Care (Signed)
   Problem: Activity: Goal: Risk for activity intolerance will decrease Outcome: Progressing   Problem: Nutrition: Goal: Adequate nutrition will be maintained Outcome: Progressing   Problem: Coping: Goal: Level of anxiety will decrease Outcome: Progressing

## 2024-04-06 NOTE — Progress Notes (Signed)
 Patient was cooperative with treatment and medication compliant. He denies SI, HI & AVH, He denies depression. No new issues to report on shift at this time.

## 2024-04-06 NOTE — Group Note (Signed)
 Physical/Occupational Therapy Group Note  Group Topic: UE Therex   Group Date: 04/06/2024 Start Time: 1300 End Time: 1350 Facilitators: Clive Warren CROME, OT     Group Description: Group instructed in series of upper extremities exercises, aimed to promote strength, flexibility, range of motion and functional endurance.  Patients provided cuing for proper mechanics and proper pace of exercise; exercises adjusted as necessary for individualized patient needs.  Patient also engaged in cognitive components throughout session, working to integrate attention to task, command following, turn-taking and appropriate social interaction throughout session.  Allowed to ask questions as appropriate, and encouraged to identify specific exercises that they could complete independently outside of group sessions.   Therapeutic Goal(s): Demonstrate appropriate performance of upper extremity exercises to promote strength, flexibility, range of motion and functional endurance Identify 2-3 specific upper extremity exercises to complete as home exercise program outside of group session   Individual Participation: Pt interactive and pleasant throughout, with and without prompting. Limited cues for technique, pt endorsing he has worked out in the past. Able to identify plan to implement outside of group.    Participation Level: Active and Engaged   Participation Quality: Independent   Behavior: Alert and Appropriate   Speech/Thought Process: Coherent, Organized, and Relevant   Affect/Mood: Happy   Insight: Good   Judgement: Good   Modes of Intervention: Activity, Clarification, Discussion, Education, Exploration, Problem-solving, Rapport Building, Socialization, and Support  Patient Response to Interventions:  Attentive, Engaged, and Interested    Plan: Continue to engage patient in PT/OT groups 1 - 2x/week.   Norlene Lanes R., MPH, MS, OTR/L ascom [phone] 04/06/24, 5:18 PM

## 2024-04-06 NOTE — Progress Notes (Signed)
 [facility] MD Progress Note  04/06/2024 3:26 PM Benjamin [name]  MRN:  [id number] Benjamin [name] is a 78 year old male who presents to the inpatient geriatric psych unit after jumping from a two story building. Patient was originally seen at [facility] who referred him to the ED who admitted him here on 03/27/2024. Patient reports that people from the [place] gang were trying to kill him and the only escape was through the bedroom window on the second floor. He currently lives in the house with [name] and his family and has been living with them for 3-4 years without any problems. On 03/19/2024 he went to the doctor and when he got back home the family wouldn't let him leave. He states that after he realized the gang was going to kill him he barricaded the door with his bed and jumped out the window. When he landed he ran away until one of his neighbors found him and called 911.   Subjective:  Chart reviewed, case discussed in multidisciplinary meeting, patient seen during rounds.  Patient is noted to be sitting in his bed and reading Bible.  He denies SI/HI/plan and denies hallucinations.  He offers no concerns.  He reports having fair appetite and sleep.  He is looking forward for his discharge.  He provided the phone number for his brother.  He is requesting for help for placement with the social worker.  Per nursing patient continues to be paranoid and confused at times.   Sleep: Fair  Appetite:  Fair  Past Psychiatric History: see h&P  Family History:  Family History  Problem Relation Age of Onset   Colon cancer Neg Hx    Colon polyps Neg Hx    Stomach cancer Neg Hx    Esophageal cancer Neg Hx    Social History:  Social History   Substance and Sexual Activity  Alcohol Use No     Social History   Substance and Sexual Activity  Drug Use No    Social History   Socioeconomic History   Marital status: Single    Spouse name: Not on file   Number of children: Not on  file   Years of education: Not on file   Highest education level: Not on file  Occupational History   Not on file  Tobacco Use   Smoking status: Former    Types: Cigarettes   Smokeless tobacco: Never  Vaping Use   Vaping status: Never Used  Substance and Sexual Activity   Alcohol use: No   Drug use: No   Sexual activity: Not on file  Other Topics Concern   Not on file  Social History Narrative   Not on file   Social Drivers of Health   Financial Resource Strain: Not on file  Food Insecurity: No Food Insecurity (03/27/2024)   Hunger Vital Sign    Worried About Running Out of Food in the Last Year: Never true    Ran Out of Food in the Last Year: Never true  Transportation Needs: No Transportation Needs (03/27/2024)   PRAPARE - Administrator, Civil Service (Medical): No    Lack of Transportation (Non-Medical): No  Physical Activity: Not on file  Stress: Not on file  Social Connections: Moderately Integrated (03/27/2024)   Social Connection and Isolation Panel    Frequency of Communication with Friends and Family: Twice a week    Frequency of Social Gatherings with Friends and Family: Once a week    Attends  Religious Services: 1 to 4 times per year    Active Member of Clubs or Organizations: No    Attends Banker Meetings: 1 to 4 times per year    Marital Status: Divorced   Past Medical History:  Past Medical History:  Diagnosis Date   Allergy    Arthritis    back   Prostate cancer (HCC) dx'd 2010   surg only    Past Surgical History:  Procedure Laterality Date   COLONOSCOPY     EYE SURGERY     PROSTATE SURGERY      Current Medications: Current Facility-Administered Medications  Medication Dose Route Frequency Provider Last Rate Last Admin   acetaminophen  (TYLENOL ) tablet 650 mg  650 mg Oral Q6H PRN Coleman, Carolyn H, NP       alum & mag hydroxide-simeth (MAALOX/MYLANTA) 200-200-20 MG/5ML suspension 30 mL  30 mL Oral Q4H PRN Coleman,  Carolyn H, NP       docusate sodium  (COLACE) capsule 100 mg  100 mg Oral Daily Jabier Deese, MD   100 mg at 04/06/24 0920   magnesium  hydroxide (MILK OF MAGNESIA) suspension 30 mL  30 mL Oral Daily PRN Coleman, Carolyn H, NP       OLANZapine  (ZYPREXA ) injection 5 mg  5 mg Intramuscular TID PRN Mardy Elveria DEL, NP       OLANZapine  (ZYPREXA ) tablet 5 mg  5 mg Oral QHS Emidio Warrell, MD   5 mg at 04/05/24 2129   OLANZapine  zydis (ZYPREXA ) disintegrating tablet 5 mg  5 mg Oral TID PRN Mardy Elveria DEL, NP        Lab Results:  No results found for this or any previous visit (from the past 48 hours).   Blood Alcohol level:  Lab Results  Component Value Date   [facility] <15 03/26/2024    Metabolic Disorder Labs: Lab Results  Component Value Date   HGBA1C 5.6 04/02/2024   MPG 114 04/02/2024   No results found for: PROLACTIN Lab Results  Component Value Date   CHOL 146 04/02/2024   TRIG 37 04/02/2024   HDL 43 04/02/2024   CHOLHDL 3.4 04/02/2024   VLDL 7 04/02/2024   LDLCALC 96 04/02/2024    Physical Findings: AIMS:  , ,  ,  ,    CIWA:    COWS:      Psychiatric Specialty Exam:  Presentation  General Appearance:  Appropriate for Environment; Bizarre  Eye Contact: Fair  Speech: Clear and Coherent  Speech Volume: Normal    Mood and Affect  Mood: Anxious  Affect: Congruent   Thought Process  Thought Processes: Disorganized  Descriptions of Associations:Intact  Orientation:Full (Time, Place and Person)  Thought Content:Illogical; Paranoid Ideation; Delusions  Hallucinations: Denies  Ideas of Reference:Delusions; Paranoia  Suicidal Thoughts: Denies  Homicidal Thoughts: Denies   Sensorium  Memory: Immediate Fair; Recent Fair; Remote Fair  Judgment: Impaired  Insight: Shallow   Executive Functions  Concentration: Fair  Attention Span: Fair  Recall: [employer] of Knowledge: Fair  Language: Fair   Psychomotor Activity   Psychomotor Activity: No data recorded  Musculoskeletal: Strength & Muscle Tone: within normal limits Gait & Station: normal Assets  Assets: Manufacturing systems engineer; Resilience    Physical Exam: Physical Exam Vitals and nursing note reviewed.    ROS Blood pressure 104/72, pulse 74, temperature 97.9 F (36.6 C), resp. rate 18, height 6' (1.829 m), weight 100 kg, SpO2 99%. Body mass index is 29.91 kg/m.  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  Patient needs to be monitored closely for ongoing psychosis and paranoid delusions.   Differential diagnosis include: unspecified psychosis, delusion disorder, dementia, schizophrenia/schizoaffective disorder   Treatment Plan Summary:   Safety and Monitoring:             -- Voluntary admission to inpatient psychiatric unit for safety, stabilization and treatment             -- Daily contact with patient to assess and evaluate symptoms and progress in treatment             -- Patient's case to be discussed in multi-disciplinary team meeting             -- Observation Level: q15 minute checks             -- Vital signs:  q12 hours             -- Precautions: suicide, elopement, and assault   2. Psychiatric Diagnoses and Treatment:               Zyprexa  5 mg nightly to help with the paranoid delusions. -- The risks/benefits/side-effects/alternatives to this medication were discussed in detail with the patient and time was given for questions. The patient consents to medication trial.                -- Metabolic profile and EKG monitoring obtained while on an atypical antipsychotic (BMI: Lipid Panel: HbgA1c: QTc:)              -- Encouraged patient to participate in unit milieu and in scheduled group therapies                             3. Medical Issues Being Addressed:    No urgent medical needs noted  4. Discharge Planning:   -- Social work and case management to assist with discharge planning and identification of hospital follow-up needs prior to discharge  -- Estimated LOS: 3-4 days  Willmar Stockinger, MD 04/06/2024, 3:26 PM

## 2024-04-06 NOTE — Group Note (Signed)
 Date:  04/06/2024 Time:  11:39 AM  Group Topic/Focus:  Rediscovering Joy:   The focus of this group is to explore various ways to relieve stress in a positive manner.    Participation Level:  Active  Participation Quality:  Appropriate  Affect:  Appropriate  Cognitive:  Appropriate  Insight: Appropriate  Engagement in Group:  Engaged  Modes of Intervention:  Activity  Additional Comments:    Camellia HERO Christerpher Clos 04/06/2024, 11:39 AM

## 2024-04-06 NOTE — Group Note (Signed)
 Date:  04/06/2024 Time:  10:37 PM  Group Topic/Focus:  Self Care:   The focus of this group is to help patients understand the importance of self-care in order to improve or restore emotional, physical, spiritual, interpersonal, and financial health. MHT went over sleep hygiene along with rules and expectations for our unit while also identifying who the nurses and techs are.    Participation Level:  Active  Participation Quality:  Appropriate  Affect:  Appropriate  Cognitive:  Alert  Insight: Appropriate  Engagement in Group:  Engaged  Modes of Intervention:  Discussion  Additional Comments:    Benjamin [name] 04/06/2024, 10:37 PM

## 2024-04-06 NOTE — Progress Notes (Signed)
 Estimated Sleeping Duration (Last 24 Hours): 8.25-10.00 hours   (Sleep Hours) - 8.25-10 hrs  (Any PRNs that were needed, meds refused, or side effects to meds)- None noted  (Any disturbances and when (visitation, over night)- Patient noted to have visible nightmares/ night terrors, where he visibly and audibly talking/ yelling to self and flailing his arms. Had to be awaken by nursing to ensure he was safe. Patient returned to sleep. Other occurences noted, but not as loud.   (Concerns raised by the patient)- none noted  (SI/HI/AVH)- denied all    04/06/24 0200  Psychosocial Assessment  Patient Complaints None  Eye Contact Fair  Facial Expression Flat  Affect Appropriate to circumstance  Speech Logical/coherent  Interaction Assertive  Motor Activity Slow  Appearance/Hygiene In scrubs  Behavior Characteristics Cooperative;Appropriate to situation  Mood Pleasant  Thought Process  Coherency Circumstantial  Content WDL  Delusions None reported or observed  Perception WDL  Hallucination None reported or observed  Judgment Impaired  Confusion WDL  Danger to Self  Current suicidal ideation? Denies  Agreement Not to Harm Self Yes  Description of Agreement verbal  Danger to Others  Danger to Others None reported or observed

## 2024-04-06 NOTE — Group Note (Signed)
 Recreation Therapy Group Note   Group Topic:Emotion Expression  Group Date: 04/06/2024 Start Time: 1400 End Time: 1500 Facilitators: Celestia Jeoffrey BRAVO, LRT, CTRS Location: Dayroom  Group Description: Painting a Diplomatic Services operational officer. Patients and LRT discuss what it means to be "at peace", what it feels like physically and mentally. Pts are given a canvas and watercolor paint to use and encouraged to draw their idea of a peaceful place. Pts and LRT discuss how they use this in their daily life post discharge. Pts are encouraged to take their canvas home with them as a reminder to find their peaceful place whenever they are feeling depressed, anxious, etc.    Goal Area(s) Addressed:  Patient will identify what it means to experience a "peaceful" emotion. Patient will identify a new coping skill.  Patient will express their emotions through art. Patients will increase communication by talking with LRT and peers while in group.   Affect/Mood: N/A   Participation Level: Did not attend    Clinical Observations/Individualized Feedback: Patient did not attend group.   Plan: Continue to engage patient in RT group sessions 2-3x/week.   Jeoffrey BRAVO Celestia, LRT, CTRS  04/06/2024 5:06 PM

## 2024-04-07 NOTE — Group Note (Signed)
 Date:  04/07/2024 Time:  12:14 PM  Group Topic/Focus:  Making Healthy Choices:   The focus of this group is to help patients identify negative/unhealthy choices they were using prior to admission and identify positive/healthier coping strategies to replace them upon discharge.    Participation Level:  Minimal  Participation Quality:  Appropriate  Affect:  Appropriate  Cognitive:  Appropriate  Insight: Appropriate  Engagement in Group:  Lacking  Modes of Intervention:  Socialization  Additional Comments:  n/a  Shacara Cozine 04/07/2024, 12:14 PM

## 2024-04-07 NOTE — Progress Notes (Signed)
 Patient is pleasant and cooperative.  Denies SI/HI and AVH.  Denies pain. Compliant with medications.  15 min checks in place for safety.  Patient is present in the milieu. Appropriate interaction with staff and peers.

## 2024-04-07 NOTE — Plan of Care (Signed)
 Benjamin [name] is a 78 y.o. male patient. No diagnosis found. Past Medical History:  Diagnosis Date   Allergy    Arthritis    back   Prostate cancer (HCC) dx'd 2010   surg only   Current Facility-Administered Medications  Medication Dose Route Frequency Provider Last Rate Last Admin   acetaminophen  (TYLENOL ) tablet 650 mg  650 mg Oral Q6H PRN Coleman, Carolyn H, NP       alum & mag hydroxide-simeth (MAALOX/MYLANTA) 200-200-20 MG/5ML suspension 30 mL  30 mL Oral Q4H PRN Coleman, Carolyn H, NP       docusate sodium  (COLACE) capsule 100 mg  100 mg Oral Daily Jadapalle, Sree, MD   100 mg at 04/06/24 0920   magnesium  hydroxide (MILK OF MAGNESIA) suspension 30 mL  30 mL Oral Daily PRN Coleman, Carolyn H, NP       OLANZapine  (ZYPREXA ) injection 5 mg  5 mg Intramuscular TID PRN Coleman, Carolyn H, NP       OLANZapine  (ZYPREXA ) tablet 5 mg  5 mg Oral QHS Jadapalle, Sree, MD   5 mg at 04/06/24 2110   OLANZapine  zydis (ZYPREXA ) disintegrating tablet 5 mg  5 mg Oral TID PRN Mardy Elveria DEL, NP       No Known Allergies Principal Problem:   Delusional disorder (HCC)  Blood pressure 123/73, pulse 83, temperature (!) 97.3 F (36.3 C), resp. rate 18, height 6' (1.829 m), weight 100 kg, SpO2 100%.  Subjective Objective Assessment & Plan  Patient was pleasant and had a great conversation. Participating in the mileau. Denied HI/SI and anxiety.  Benjamin [name] Benjamin [name] 04/07/2024

## 2024-04-07 NOTE — Plan of Care (Signed)
  Problem: Education: Goal: Emotional status will improve Outcome: Progressing Goal: Verbalization of understanding the information provided will improve Outcome: Progressing   Problem: Activity: Goal: Interest or engagement in activities will improve Outcome: Progressing   

## 2024-04-07 NOTE — BH IP Treatment Plan (Signed)
 Interdisciplinary Treatment and Diagnostic Plan Update  04/07/2024 Time of Session: Benjamin [name] MRN: [id number]  Principal Diagnosis: Delusional disorder [facility])  Secondary Diagnoses: Principal Problem:   Delusional disorder (HCC)   Current Medications:  Current Facility-Administered Medications  Medication Dose Route Frequency Provider Last Rate Last Admin   acetaminophen  (TYLENOL ) tablet 650 mg  650 mg Oral Q6H PRN Coleman, Carolyn H, NP       alum & mag hydroxide-simeth (MAALOX/MYLANTA) 200-200-20 MG/5ML suspension 30 mL  30 mL Oral Q4H PRN Coleman, Carolyn H, NP       docusate sodium  (COLACE) capsule 100 mg  100 mg Oral Daily Jadapalle, Sree, MD   100 mg at 04/07/24 9076   magnesium  hydroxide (MILK OF MAGNESIA) suspension 30 mL  30 mL Oral Daily PRN Coleman, Carolyn H, NP       OLANZapine  (ZYPREXA ) injection 5 mg  5 mg Intramuscular TID PRN Mardy Elveria DEL, NP       OLANZapine  (ZYPREXA ) tablet 5 mg  5 mg Oral QHS Jadapalle, Sree, MD   5 mg at 04/06/24 2110   OLANZapine  zydis (ZYPREXA ) disintegrating tablet 5 mg  5 mg Oral TID PRN Coleman, Carolyn H, NP       PTA Medications: Medications Prior to Admission  Medication Sig Dispense Refill Last Dose/Taking   docusate sodium  (COLACE) 100 MG capsule Take 100 mg by mouth daily.      omega-3 acid ethyl esters (LOVAZA) 1 g capsule Take 1 g by mouth daily.       Patient Stressors: Traumatic event    Patient Strengths: Communication skills   Treatment Modalities: Medication Management, Group therapy, Case management,  1 to 1 session with clinician, Psychoeducation, Recreational therapy.   Physician Treatment Plan for Primary Diagnosis: Delusional disorder [facility]) Long Term Goal(s): Improvement in symptoms so as ready for discharge   Short Term Goals: Ability to identify changes in lifestyle to reduce recurrence of condition will improve Ability to verbalize feelings will improve Ability to disclose and discuss suicidal  ideas Ability to demonstrate self-control will improve Ability to identify and develop effective coping behaviors will improve  Medication Management: Evaluate patient's response, side effects, and tolerance of medication regimen.  Therapeutic Interventions: 1 to 1 sessions, Unit Group sessions and Medication administration.  Evaluation of Outcomes: Progressing  Physician Treatment Plan for Secondary Diagnosis: Principal Problem:   Delusional disorder (HCC)  Long Term Goal(s): Improvement in symptoms so as ready for discharge   Short Term Goals: Ability to identify changes in lifestyle to reduce recurrence of condition will improve Ability to verbalize feelings will improve Ability to disclose and discuss suicidal ideas Ability to demonstrate self-control will improve Ability to identify and develop effective coping behaviors will improve     Medication Management: Evaluate patient's response, side effects, and tolerance of medication regimen.  Therapeutic Interventions: 1 to 1 sessions, Unit Group sessions and Medication administration.  Evaluation of Outcomes: Progressing   RN Treatment Plan for Primary Diagnosis: Delusional disorder (HCC) Long Term Goal(s): Knowledge of disease and therapeutic regimen to maintain health will improve  Short Term Goals: Ability to remain free from injury will improve, Ability to verbalize frustration and anger appropriately will improve, Ability to demonstrate self-control, Ability to participate in decision making will improve, Ability to verbalize feelings will improve, Ability to disclose and discuss suicidal ideas, Ability to identify and develop effective coping behaviors will improve, and Compliance with prescribed medications will improve  Medication Management: RN will administer medications as ordered  by provider, will assess and evaluate patient's response and provide education to patient for prescribed medication. RN will report any  adverse and/or side effects to prescribing provider.  Therapeutic Interventions: 1 on 1 counseling sessions, Psychoeducation, Medication administration, Evaluate responses to treatment, Monitor vital signs and CBGs as ordered, Perform/monitor CIWA, COWS, AIMS and Fall Risk screenings as ordered, Perform wound care treatments as ordered.  Evaluation of Outcomes: Progressing   LCSW Treatment Plan for Primary Diagnosis: Delusional disorder [name]) Long Term Goal(s): Safe transition to appropriate next level of care at discharge, Engage patient in therapeutic group addressing interpersonal concerns.  Short Term Goals: Engage patient in aftercare planning with referrals and resources, Increase social support, Increase ability to appropriately verbalize feelings, Increase emotional regulation, Facilitate acceptance of mental health diagnosis and concerns, Facilitate patient progression through stages of change regarding substance use diagnoses and concerns, Identify triggers associated with mental health/substance abuse issues, and Increase skills for wellness and recovery  Therapeutic Interventions: Assess for all discharge needs, 1 to 1 time with Social worker, Explore available resources and support systems, Assess for adequacy in community support network, Educate family and significant other(s) on suicide prevention, Complete Psychosocial Assessment, Interpersonal group therapy.  Evaluation of Outcomes: Progressing   Progress in Treatment: Attending groups: Yes. Participating in groups: Yes. Taking medication as prescribed: Yes. Toleration medication: Yes. Family/Significant other contact made: Yes, individual(s) contacted:  SPE completed with Luthor Weathers, pt brother [phone]) on 03/31/2024. Patient understands diagnosis: Yes. Discussing patient identified problems/goals with staff: Yes. Medical problems stabilized or resolved: Yes. Denies suicidal/homicidal ideation:  Yes. Issues/concerns per patient self-inventory: No. Other:   New problem(s) identified: No, Describe:  None identified Update 04/02/24: No changes at this time. Update 04/07/24: No changes at this time    New Short Term/Long Term Goal(s): elimination of symptoms of psychosis, medication management for mood stabilization; elimination of SI thoughts; development of comprehensive mental wellness plan. Update 04/02/24: No changes at this time. Update 04/07/24: No changes at this time.   Patient Goals:  Get out of here, I want to get healed up so I can get out of here Update 04/02/24: No changes at this time. Update 04/07/24: No changes at this time .   Discharge Plan or Barriers: CSW will assist with appropriate discharge planning  Update 04/02/24: No changes at this time. Update 04/07/24: No changes at this time.   Reason for Continuation of Hospitalization: Delusions  Medication stabilization   Estimated Length of Stay: 1 to 7 days Update 04/02/24: TBD. Update 04/07/24: TBD  Last 3 [place] Suicide Severity Risk Score: Flowsheet Row Admission (Current) from 03/27/2024 in [name] ED from 03/26/2024 in [facility] Department at [name] ED from 03/25/2024 in [facility]  C-SSRS RISK CATEGORY No Risk No Risk No Risk    Last [facility] 2/9 Scores:     No data to display          Scribe for Treatment Team: Aldo CHRISTELLA Dorthy KEN 04/07/2024 10:26 AM
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
## 2024-04-07 NOTE — Group Note (Signed)
 Date:  04/07/2024 Time:  10:59 PM  Group Topic/Focus:  Dimensions of Wellness:   The focus of this group is to introduce the topic of wellness and discuss the role each dimension of wellness plays in total health. Emotional Education:   The focus of this group is to discuss what feelings/emotions are, and how they are experienced. Managing Feelings:   The focus of this group is to identify what feelings patients have difficulty handling and develop a plan to handle them in a healthier way upon discharge.    Participation Level:  Active  Participation Quality:  Appropriate and Attentive  Affect:  Appropriate  Cognitive:  Alert and Appropriate  Insight: Appropriate and Good  Engagement in Group:  Engaged and Supportive  Modes of Intervention:  Discussion, Socialization, and Support  Additional Comments:  Pt did attend group.Will continue to motivate pt to keep attending group with peers.  Brad GORMAN Ryder 04/07/2024, 10:59 PM

## 2024-04-08 NOTE — Group Note (Signed)
 Date:  04/08/2024 Time:  9:48 PM  Group Topic/Focus:  Wrap-Up Group:   The focus of this group is to help patients review their daily goal of treatment and discuss progress on daily workbooks.    Participation Level:  Minimal  Participation Quality:  Appropriate  Affect:  Appropriate  Cognitive:  Appropriate  Insight: Good  Engagement in Group:  Engaged  Modes of Intervention:  Discussion  Additional Comments:    Benjamin [name] 04/08/2024, 9:48 PM
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
## 2024-04-08 NOTE — Plan of Care (Signed)

## 2024-04-08 NOTE — Plan of Care (Signed)
  Problem: Pain Managment: Goal: General experience of comfort will improve and/or be controlled Outcome: Progressing   Problem: Safety: Goal: Ability to remain free from injury will improve Outcome: Progressing

## 2024-04-08 NOTE — Progress Notes (Signed)
   04/07/24 2100  Psych Admission Type (Psych Patients Only)  Admission Status Involuntary  Psychosocial Assessment  Patient Complaints None  Eye Contact Fair  Facial Expression Animated  Affect Appropriate to circumstance  Speech Logical/coherent  Interaction Assertive  Motor Activity Slow  Appearance/Hygiene In scrubs  Behavior Characteristics Cooperative  Mood Pleasant  Thought Process  Coherency WDL  Content Delusions  Delusions Paranoid  Perception Depersonalization  Hallucination None reported or observed  Judgment Impaired  Confusion None  Danger to Self  Current suicidal ideation? Denies

## 2024-04-08 NOTE — Group Note (Signed)
 Date:  04/08/2024 Time:  1:08 PM  Group Topic/Focus:  Making Healthy Choices:   The focus of this group is to help patients identify negative/unhealthy choices they were using prior to admission and identify positive/healthier coping strategies to replace them upon discharge.    Participation Level:  Active  Participation Quality:  Appropriate  Affect:  Appropriate  Cognitive:  Appropriate  Insight: Good  Engagement in Group:  Engaged  Modes of Intervention:  Discussion  Additional Comments:  N/A  [name] Gavel 04/08/2024, 1:08 PM

## 2024-04-09 DIAGNOSIS — F22 Delusional disorders: Secondary | ICD-10-CM | POA: Diagnosis not present

## 2024-04-09 NOTE — Progress Notes (Signed)
   04/09/24 0100  Psych Admission Type (Psych Patients Only)  Admission Status Involuntary  Psychosocial Assessment  Patient Complaints None  Eye Contact Fair  Facial Expression Animated  Affect Appropriate to circumstance  Speech Logical/coherent  Interaction Assertive  Motor Activity Slow  Appearance/Hygiene In scrubs  Behavior Characteristics Cooperative;Appropriate to situation  Mood Pleasant  Aggressive Behavior  Targets Other (Comment)  Type of Behavior Other (Comment) (cooperative)  Thought Process  Coherency WDL  Content Paranoia  Delusions Paranoid  Perception WDL  Hallucination None reported or observed  Judgment Impaired  Confusion None  Danger to Self  Current suicidal ideation? Denies  Agreement Not to Harm Self Yes  Description of Agreement Verbal  Danger to Others  Danger to Others None reported or observed

## 2024-04-09 NOTE — Plan of Care (Addendum)
  Problem: Education: Goal: Knowledge of General Education information will improve Description: Including pain rating scale, medication(s)/side effects and non-pharmacologic comfort measures Outcome: Progressing   Problem: Health Behavior/Discharge Planning: Goal: Ability to manage health-related needs will improve Outcome: Progressing   Problem: Clinical Measurements: Goal: Ability to maintain clinical measurements within normal limits will improve Outcome: Progressing Goal: Will remain free from infection Outcome: Progressing Goal: Diagnostic test results will improve Outcome: Progressing Goal: Respiratory complications will improve Outcome: Progressing Goal: Cardiovascular complication will be avoided Outcome: Progressing   Problem: Activity: Goal: Risk for activity intolerance will decrease Outcome: Progressing   Problem: Nutrition: Goal: Adequate nutrition will be maintained Outcome: Progressing   Problem: Coping: Goal: Level of anxiety will decrease Outcome: Progressing   Problem: Elimination: Goal: Will not experience complications related to bowel motility Outcome: Progressing Goal: Will not experience complications related to urinary retention Outcome: Progressing   Problem: Pain Managment: Goal: General experience of comfort will improve and/or be controlled Outcome: Progressing   Problem: Safety: Goal: Ability to remain free from injury will improve Outcome: Progressing   Problem: Skin Integrity: Goal: Risk for impaired skin integrity will decrease Outcome: Progressing   Problem: Education: Goal: Knowledge of [place] [facility] Education information/materials will improve Outcome: Progressing Goal: Emotional status will improve Outcome: Progressing Goal: Mental status will improve Outcome: Progressing Goal: Verbalization of understanding the information provided will improve Outcome: Progressing   Problem: Activity: Goal: Interest or  engagement in activities will improve Outcome: Progressing Goal: Sleeping patterns will improve Outcome: Progressing   Problem: Coping: Goal: Ability to verbalize frustrations and anger appropriately will improve Outcome: Progressing Goal: Ability to demonstrate self-control will improve Outcome: Progressing   Problem: Health Behavior/Discharge Planning: Goal: Identification of resources available to assist in meeting health care needs will improve Outcome: Progressing Goal: Compliance with treatment plan for underlying cause of condition will improve Outcome: Progressing   Problem: Physical Regulation: Goal: Ability to maintain clinical measurements within normal limits will improve Outcome: Progressing   Problem: Safety: Goal: Periods of time without injury will increase Outcome: Progressing

## 2024-04-09 NOTE — Group Note (Signed)
 Date:  04/09/2024 Time:  6:36 PM  Group Topic/Focus:  Managing Feelings:   The focus of this group is to identify what feelings patients have difficulty handling and develop a plan to handle them in a healthier way upon discharge.    Participation Level:  Did Not Attend   Benjamin [name] 04/09/2024, 6:36 PM

## 2024-04-09 NOTE — [facility] Counselor (Signed)
 CSW contacted [name], pt's brother, [phone].   Luthor reports there is not a lot of information that he can provide because he has not seen pt in a very long-time. Reports he is one of his half siblings and that pt was never close with his siblings and was paranoid and secretive.   Luthor reports CSW could contact him if CSW needed to.   CSW informed care team.   Lum Croft, MSW, [facility] 04/13/2024 2:59 PM

## 2024-04-09 NOTE — Group Note (Signed)
 Recreation Therapy Group Note   Group Topic:Emotion Expression  Group Date: 04/09/2024 Start Time: 1500 End Time: 1600 Facilitators: Celestia Jeoffrey BRAVO, LRT, CTRS Location: Dayroom  Group Description: Painting a Diplomatic Services operational officer. Patients and LRT discuss what it means to be "at peace", what it feels like physically and mentally. Pts are given a canvas and watercolor paint to use and encouraged to draw their idea of a peaceful place. Pts and LRT discuss how they use this in their daily life post discharge. Pts are encouraged to take their canvas home with them as a reminder to find their peaceful place whenever they are feeling depressed, anxious, etc.    Goal Area(s) Addressed:  Patient will identify what it means to experience a "peaceful" emotion. Patient will identify a new coping skill.  Patient will express their emotions through art. Patients will increase communication by talking with LRT and peers while in group.   Affect/Mood: N/A   Participation Level: Did not attend    Clinical Observations/Individualized Feedback: Patient did not attend group.   Plan: Continue to engage patient in RT group sessions 2-3x/week.   Jeoffrey BRAVO Celestia, LRT, CTRS 04/09/2024 5:33 PM

## 2024-04-09 NOTE — Plan of Care (Signed)
 Problem: Education: Goal: Knowledge of General Education information will improve Description: Including pain rating scale, medication(s)/side effects and non-pharmacologic comfort measures 04/09/2024 0752 by Rudolpho Carrier, RN Outcome: Progressing 04/09/2024 0748 by Rudolpho Carrier, RN Outcome: Progressing   Problem: Health Behavior/Discharge Planning: Goal: Ability to manage health-related needs will improve 04/09/2024 0752 by Rudolpho Carrier, RN Outcome: Progressing 04/09/2024 0748 by Rudolpho Carrier, RN Outcome: Progressing   Problem: Clinical Measurements: Goal: Ability to maintain clinical measurements within normal limits will improve 04/09/2024 0752 by Rudolpho Carrier, RN Outcome: Progressing 04/09/2024 0748 by Rudolpho Carrier, RN Outcome: Progressing Goal: Will remain free from infection 04/09/2024 0752 by Rudolpho Carrier, RN Outcome: Progressing 04/09/2024 0748 by Rudolpho Carrier, RN Outcome: Progressing Goal: Diagnostic test results will improve 04/09/2024 0752 by Rudolpho Carrier, RN Outcome: Progressing 04/09/2024 0748 by Rudolpho Carrier, RN Outcome: Progressing Goal: Respiratory complications will improve 04/09/2024 0752 by Rudolpho Carrier, RN Outcome: Progressing 04/09/2024 0748 by Rudolpho Carrier, RN Outcome: Progressing Goal: Cardiovascular complication will be avoided 04/09/2024 0752 by Rudolpho Carrier, RN Outcome: Progressing 04/09/2024 0748 by Rudolpho Carrier, RN Outcome: Progressing   Problem: Activity: Goal: Risk for activity intolerance will decrease 04/09/2024 0752 by Rudolpho Carrier, RN Outcome: Progressing 04/09/2024 0748 by Rudolpho Carrier, RN Outcome: Progressing   Problem: Nutrition: Goal: Adequate nutrition will be maintained 04/09/2024 0752 by Rudolpho Carrier, RN Outcome: Progressing 04/09/2024 0748 by Rudolpho Carrier, RN Outcome: Progressing   Problem: Coping: Goal: Level of anxiety will decrease 04/09/2024 0752 by Rudolpho Carrier, RN Outcome: Progressing 04/09/2024 0748 by Rudolpho Carrier, RN Outcome: Progressing   Problem: Elimination: Goal: Will not experience complications related to bowel motility 04/09/2024 0752 by Rudolpho Carrier, RN Outcome: Progressing 04/09/2024 0748 by Rudolpho Carrier, RN Outcome: Progressing Goal: Will not experience complications related to urinary retention 04/09/2024 0752 by Rudolpho Carrier, RN Outcome: Progressing 04/09/2024 0748 by Rudolpho Carrier, RN Outcome: Progressing   Problem: Pain Managment: Goal: General experience of comfort will improve and/or be controlled 04/09/2024 0752 by Rudolpho Carrier, RN Outcome: Progressing 04/09/2024 0748 by Rudolpho Carrier, RN Outcome: Progressing   Problem: Safety: Goal: Ability to remain free from injury will improve 04/09/2024 0752 by Rudolpho Carrier, RN Outcome: Progressing 04/09/2024 0748 by Rudolpho Carrier, RN Outcome: Progressing   Problem: Skin Integrity: Goal: Risk for impaired skin integrity will decrease 04/09/2024 0752 by Rudolpho Carrier, RN Outcome: Progressing 04/09/2024 0748 by Rudolpho Carrier, RN Outcome: Progressing   Problem: Education: Goal: Knowledge of [place] [facility] Education information/materials will improve 04/09/2024 0752 by Rudolpho Carrier, RN Outcome: Progressing 04/09/2024 0748 by Rudolpho Carrier, RN Outcome: Progressing Goal: Emotional status will improve 04/09/2024 0752 by Rudolpho Carrier, RN Outcome: Progressing 04/09/2024 0748 by Rudolpho Carrier, RN Outcome: Progressing Goal: Mental status will improve 04/09/2024 0752 by Rudolpho Carrier, RN Outcome: Progressing 04/09/2024 0748 by Rudolpho Carrier, RN Outcome: Progressing Goal: Verbalization of understanding the information provided will improve 04/09/2024 0752 by Rudolpho Carrier, RN Outcome: Progressing 04/09/2024 0748 by Rudolpho Carrier, RN Outcome: Progressing   Problem: Activity: Goal: Interest or engagement in  activities will improve 04/09/2024 0752 by Rudolpho Carrier, RN Outcome: Progressing 04/09/2024 0748 by Rudolpho Carrier, RN Outcome: Progressing Goal: Sleeping patterns will improve 04/09/2024 0752 by Rudolpho Carrier, RN Outcome: Progressing 04/09/2024 0748 by Rudolpho Carrier, RN Outcome: Progressing   Problem: Coping: Goal: Ability to verbalize frustrations and anger appropriately will improve 04/09/2024 0752 by Rudolpho Carrier, RN Outcome: Progressing 04/09/2024 0748 by Rudolpho Carrier, RN Outcome: Progressing Goal: Ability to demonstrate self-control will improve 04/09/2024 0752 by Rudolpho Carrier, RN Outcome:  Progressing 04/09/2024 0748 by Rudolpho Carrier, RN Outcome: Progressing   Problem: Health Behavior/Discharge Planning: Goal: Identification of resources available to assist in meeting health care needs will improve 04/09/2024 0752 by Rudolpho Carrier, RN Outcome: Progressing 04/09/2024 0748 by Rudolpho Carrier, RN Outcome: Progressing Goal: Compliance with treatment plan for underlying cause of condition will improve 04/09/2024 0752 by Rudolpho Carrier, RN Outcome: Progressing 04/09/2024 0748 by Rudolpho Carrier, RN Outcome: Progressing   Problem: Physical Regulation: Goal: Ability to maintain clinical measurements within normal limits will improve 04/09/2024 0752 by Rudolpho Carrier, RN Outcome: Progressing 04/09/2024 0748 by Rudolpho Carrier, RN Outcome: Progressing   Problem: Safety: Goal: Periods of time without injury will increase 04/09/2024 0752 by Rudolpho Carrier, RN Outcome: Progressing 04/09/2024 0748 by Rudolpho Carrier, RN Outcome: Progressing

## 2024-04-09 NOTE — Plan of Care (Signed)
   Problem: Health Behavior/Discharge Planning: Goal: Ability to manage health-related needs will improve Outcome: Progressing   Problem: Clinical Measurements: Goal: Will remain free from infection Outcome: Progressing   Problem: Activity: Goal: Risk for activity intolerance will decrease Outcome: Progressing

## 2024-04-09 NOTE — Progress Notes (Signed)
 [name] MD Progress Note  04/09/2024 2:52 PM Benjamin [name]  MRN:  [id number] Benjamin [name] is a 78 year old male who presents to the inpatient geriatric psych unit after jumping from a two story building. Patient was originally seen at [facility] who referred him to the ED who admitted him here on 03/27/2024. Patient reports that people from the [place] gang were trying to kill him and the only escape was through the bedroom window on the second floor. He currently lives in the house with [name] and his family and has been living with them for 3-4 years without any problems. On 03/19/2024 he went to the doctor and when he got back home the family wouldn't let him leave. He states that after he realized the gang was going to kill him he barricaded the door with his bed and jumped out the window. When he landed he ran away until one of his neighbors found him and called 911.   Subjective:  Chart reviewed, case discussed in multidisciplinary meeting, patient seen during rounds.   Patient continues to minimize behaviors which led to him coming here he jumped out of the window and now he says that he is feeling better but does appear to be internally preoccupied and says that he was not scared of any guns.  Denies any current suicidal homicidal thoughts Sleep: Fair  Appetite:  Fair  Past Psychiatric History: see h&P  Family History:  Family History  Problem Relation Age of Onset   Colon cancer Neg Hx    Colon polyps Neg Hx    Stomach cancer Neg Hx    Esophageal cancer Neg Hx    Social History:  Social History   Substance and Sexual Activity  Alcohol Use No     Social History   Substance and Sexual Activity  Drug Use No    Social History   Socioeconomic History   Marital status: Single    Spouse name: Not on file   Number of children: Not on file   Years of education: Not on file   Highest education level: Not on file  Occupational History   Not on file  Tobacco  Use   Smoking status: Former    Types: Cigarettes   Smokeless tobacco: Never  Vaping Use   Vaping status: Never Used  Substance and Sexual Activity   Alcohol use: No   Drug use: No   Sexual activity: Not on file  Other Topics Concern   Not on file  Social History Narrative   Not on file   Social Drivers of Health   Financial Resource Strain: Not on file  Food Insecurity: No Food Insecurity (03/27/2024)   Hunger Vital Sign    Worried About Running Out of Food in the Last Year: Never true    Ran Out of Food in the Last Year: Never true  Transportation Needs: No Transportation Needs (03/27/2024)   PRAPARE - Administrator, Civil Service (Medical): No    Lack of Transportation (Non-Medical): No  Physical Activity: Not on file  Stress: Not on file  Social Connections: Moderately Integrated (03/27/2024)   Social Connection and Isolation Panel    Frequency of Communication with Friends and Family: Twice a week    Frequency of Social Gatherings with Friends and Family: Once a week    Attends Religious Services: 1 to 4 times per year    Active Member of [employer] or Organizations: No    Attends Club or  Organization Meetings: 1 to 4 times per year    Marital Status: Divorced   Past Medical History:  Past Medical History:  Diagnosis Date   Allergy    Arthritis    back   Prostate cancer (HCC) dx'd 2010   surg only    Past Surgical History:  Procedure Laterality Date   COLONOSCOPY     EYE SURGERY     PROSTATE SURGERY      Current Medications: Current Facility-Administered Medications  Medication Dose Route Frequency Provider Last Rate Last Admin   acetaminophen  (TYLENOL ) tablet 650 mg  650 mg Oral Q6H PRN Coleman, Carolyn H, NP       alum & mag hydroxide-simeth (MAALOX/MYLANTA) 200-200-20 MG/5ML suspension 30 mL  30 mL Oral Q4H PRN Coleman, Carolyn H, NP       docusate sodium  (COLACE) capsule 100 mg  100 mg Oral Daily Jadapalle, Sree, MD   100 mg at 04/09/24 1004    magnesium  hydroxide (MILK OF MAGNESIA) suspension 30 mL  30 mL Oral Daily PRN Coleman, Carolyn H, NP       OLANZapine  (ZYPREXA ) injection 5 mg  5 mg Intramuscular TID PRN Coleman, Carolyn H, NP       OLANZapine  (ZYPREXA ) tablet 5 mg  5 mg Oral QHS Jadapalle, Sree, MD   5 mg at 04/08/24 2131   OLANZapine  zydis (ZYPREXA ) disintegrating tablet 5 mg  5 mg Oral TID PRN Mardy Elveria DEL, NP        Lab Results:  No results found for this or any previous visit (from the past 48 hours).   Blood Alcohol level:  Lab Results  Component Value Date   [facility] <15 03/26/2024    Metabolic Disorder Labs: Lab Results  Component Value Date   HGBA1C 5.6 04/02/2024   MPG 114 04/02/2024   No results found for: PROLACTIN Lab Results  Component Value Date   CHOL 146 04/02/2024   TRIG 37 04/02/2024   HDL 43 04/02/2024   CHOLHDL 3.4 04/02/2024   VLDL 7 04/02/2024   LDLCALC 96 04/02/2024    Physical Findings: AIMS:  , ,  ,  ,    CIWA:    COWS:      Psychiatric Specialty Exam:  Presentation  General Appearance:  Appropriate for Environment; Bizarre  Eye Contact: Fair  Speech: Clear and Coherent  Speech Volume: Normal    Mood and Affect  Mood: Anxious  Affect: Congruent   Thought Process  Thought Processes: Disorganized  Descriptions of Associations:Intact  Orientation:Full (Time, Place and Person)  Thought Content:Illogical; Paranoid Ideation; Delusions  Hallucinations: Denies  Ideas of Reference:Delusions; Paranoia  Suicidal Thoughts: Denies  Homicidal Thoughts: Denies   Sensorium  Memory: Immediate Fair; Recent Fair; Remote Fair  Judgment: Impaired  Insight: Shallow   Executive Functions  Concentration: Fair  Attention Span: Fair  Recall: [employer] of Knowledge: Fair  Language: Fair   Psychomotor Activity  Psychomotor Activity: No data recorded  Musculoskeletal: Strength & Muscle Tone: within normal limits Gait & Station:  normal Assets  Assets: Manufacturing systems engineer; Resilience    Physical Exam: Physical Exam Vitals and nursing note reviewed.    ROS Blood pressure 132/82, pulse 73, temperature 97.9 F (36.6 C), resp. rate 16, height 6' (1.829 m), weight 100 kg, SpO2 100%. Body mass index is 29.91 kg/m.  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  Patient needs to be monitored closely for ongoing psychosis and paranoid delusions.   Differential diagnosis include: unspecified psychosis, delusion disorder, dementia, schizophrenia/schizoaffective disorder   Treatment Plan Summary:   Safety and Monitoring:             -- Voluntary admission to inpatient psychiatric unit for safety, stabilization and treatment             -- Daily contact with patient to assess and evaluate symptoms and progress in treatment             -- Patient's case to be discussed in multi-disciplinary team meeting             -- Observation Level: q15 minute checks             -- Vital signs:  q12 hours             -- Precautions: suicide, elopement, and assault   2. Psychiatric Diagnoses and Treatment:               Zyprexa  5 mg nightly to help with the paranoid delusions. -- The risks/benefits/side-effects/alternatives to this medication were discussed in detail with the patient and time was given for questions. The patient consents to medication trial.                -- Metabolic profile and EKG monitoring  obtained while on an atypical antipsychotic (BMI: Lipid Panel: HbgA1c: QTc:)              -- Encouraged patient to participate in unit milieu and in scheduled group therapies                            3. Medical Issues Being Addressed:    No urgent medical needs noted  4. Discharge Planning:   -- Social work and case management to assist with discharge planning and identification of hospital follow-up needs prior to discharge  -- Estimated LOS: 3-4 days  Millie JONELLE Manners, MD 04/09/2024, 2:52 PM

## 2024-04-10 DIAGNOSIS — F22 Delusional disorders: Secondary | ICD-10-CM | POA: Diagnosis not present

## 2024-04-10 NOTE — Progress Notes (Signed)
 [facility] MD Progress Note  04/10/2024 11:04 AM Benjamin [name]  MRN:  [id number] Benjamin [name] is a 78 year old male who presents to the inpatient geriatric psych unit after jumping from a two story building. Patient was originally seen at [facility] who referred him to the ED who admitted him here on 03/27/2024. Patient reports that people from the [place] gang were trying to kill him and the only escape was through the bedroom window on the second floor. He currently lives in the house with [name] and his family and has been living with them for 3-4 years without any problems. On 03/19/2024 he went to the doctor and when he got back home the family wouldn't let him leave. He states that after he realized the gang was going to kill him he barricaded the door with his bed and jumped out the window. When he landed he ran away until one of his neighbors found him and called 911.   Subjective:  Chart reviewed, case discussed in multidisciplinary meeting, patient seen during rounds.  On interview today patient is noted to be resting in bed.  He reports that he wants to leave the hospital.  Provider encouraged him to reach out to his brother and make arrangements for his living.  He wants a Child psychotherapist to help him with his placement options.  He reports that he gets Tree surgeon and has money.  Provider discussed boarding houses.  He denies SI/HI/plan and denies hallucinations.  Per nursing report patient has baseline paranoia but is able to do his ADLs and participate in groups.  He is taking his medications with no reported side effects.   Sleep: Fair  Appetite:  Fair  Past Psychiatric History: see h&P  Family History:  Family History  Problem Relation Age of Onset   Colon cancer Neg Hx    Colon polyps Neg Hx    Stomach cancer Neg Hx    Esophageal cancer Neg Hx    Social History:  Social History   Substance and Sexual Activity  Alcohol Use No     Social History    Substance and Sexual Activity  Drug Use No    Social History   Socioeconomic History   Marital status: Single    Spouse name: Not on file   Number of children: Not on file   Years of education: Not on file   Highest education level: Not on file  Occupational History   Not on file  Tobacco Use   Smoking status: Former    Types: Cigarettes   Smokeless tobacco: Never  Vaping Use   Vaping status: Never Used  Substance and Sexual Activity   Alcohol use: No   Drug use: No   Sexual activity: Not on file  Other Topics Concern   Not on file  Social History Narrative   Not on file   Social Drivers of Health   Financial Resource Strain: Not on file  Food Insecurity: No Food Insecurity (03/27/2024)   Hunger Vital Sign    Worried About Running Out of Food in the Last Year: Never true    Ran Out of Food in the Last Year: Never true  Transportation Needs: No Transportation Needs (03/27/2024)   PRAPARE - Administrator, Civil Service (Medical): No    Lack of Transportation (Non-Medical): No  Physical Activity: Not on file  Stress: Not on file  Social Connections: Moderately Integrated (03/27/2024)   Social Connection and Isolation Panel  Frequency of Communication with Friends and Family: Twice a week    Frequency of Social Gatherings with Friends and Family: Once a week    Attends Religious Services: 1 to 4 times per year    Active Member of [employer] or Organizations: No    Attends Engineer, structural: 1 to 4 times per year    Marital Status: Divorced   Past Medical History:  Past Medical History:  Diagnosis Date   Allergy    Arthritis    back   Prostate cancer (HCC) dx'd 2010   surg only    Past Surgical History:  Procedure Laterality Date   COLONOSCOPY     EYE SURGERY     PROSTATE SURGERY      Current Medications: Current Facility-Administered Medications  Medication Dose Route Frequency Provider Last Rate Last Admin   acetaminophen  (TYLENOL )  tablet 650 mg  650 mg Oral Q6H PRN Coleman, Carolyn H, NP       alum & mag hydroxide-simeth (MAALOX/MYLANTA) 200-200-20 MG/5ML suspension 30 mL  30 mL Oral Q4H PRN Coleman, Carolyn H, NP       docusate sodium  (COLACE) capsule 100 mg  100 mg Oral Daily Addilyn Satterwhite, MD   100 mg at 04/10/24 1003   magnesium  hydroxide (MILK OF MAGNESIA) suspension 30 mL  30 mL Oral Daily PRN Coleman, Carolyn H, NP       OLANZapine  (ZYPREXA ) injection 5 mg  5 mg Intramuscular TID PRN Coleman, Carolyn H, NP       OLANZapine  (ZYPREXA ) tablet 5 mg  5 mg Oral QHS Folasade Mooty, MD   5 mg at 04/09/24 2137   OLANZapine  zydis (ZYPREXA ) disintegrating tablet 5 mg  5 mg Oral TID PRN Mardy Elveria DEL, NP        Lab Results:  No results found for this or any previous visit (from the past 48 hours).   Blood Alcohol level:  Lab Results  Component Value Date   [facility] <15 03/26/2024    Metabolic Disorder Labs: Lab Results  Component Value Date   HGBA1C 5.6 04/02/2024   MPG 114 04/02/2024   No results found for: PROLACTIN Lab Results  Component Value Date   CHOL 146 04/02/2024   TRIG 37 04/02/2024   HDL 43 04/02/2024   CHOLHDL 3.4 04/02/2024   VLDL 7 04/02/2024   LDLCALC 96 04/02/2024    Physical Findings: AIMS:  , ,  ,  ,    CIWA:    COWS:      Psychiatric Specialty Exam:  Presentation  General Appearance:  Appropriate for Environment; Bizarre  Eye Contact: Fair  Speech: Clear and Coherent  Speech Volume: Normal    Mood and Affect  Mood: Anxious  Affect: Congruent   Thought Process  Thought Processes: Disorganized  Descriptions of Associations:Intact  Orientation:Full (Time, Place and Person)  Thought Content: Paranoia at baseline Hallucinations: Denies  Ideas of Reference: Paranoia at baseline Suicidal Thoughts: Denies  Homicidal Thoughts: Denies   Sensorium  Memory: Immediate Fair; Recent Fair; Remote  Fair  Judgment: Impaired  Insight: Shallow   Executive Functions  Concentration: Fair  Attention Span: Fair  Recall: [employer] of Knowledge: Fair  Language: Fair   Psychomotor Activity  Psychomotor Activity: No data recorded  Musculoskeletal: Strength & Muscle Tone: within normal limits Gait & Station: normal Assets  Assets: Manufacturing systems engineer; Resilience    Physical Exam: Physical Exam Vitals and nursing note reviewed.    ROS Blood pressure ROLLEN)  145/79, pulse 66, temperature 98.1 F (36.7 C), resp. rate 18, height 6' (1.829 m), weight 100 kg, SpO2 100%. Body mass index is 29.91 kg/m.  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  Patient needs to be monitored closely for ongoing psychosis and paranoid delusions.   Differential diagnosis include: unspecified psychosis, delusion disorder, dementia, schizophrenia/schizoaffective disorder   Treatment Plan Summary:   Safety and Monitoring:             -- Voluntary admission to inpatient psychiatric unit for safety, stabilization and treatment             -- Daily contact with patient to assess and evaluate symptoms and progress in treatment             -- Patient's case to be discussed in multi-disciplinary team meeting             -- Observation Level: q15 minute checks             -- Vital signs:  q12 hours             -- Precautions: suicide, elopement, and assault   2. Psychiatric Diagnoses and Treatment:               Zyprexa  5 mg nightly to help with the paranoid delusions. -- The risks/benefits/side-effects/alternatives to this medication were discussed in detail with the patient and time was given for questions. The patient consents to medication trial.                 -- Metabolic profile and EKG monitoring obtained while on an atypical antipsychotic (BMI: Lipid Panel: HbgA1c: QTc:)              -- Encouraged patient to participate in unit milieu and in scheduled group therapies                            3. Medical Issues Being Addressed:    No urgent medical needs noted  4. Discharge Planning:   -- Social work and case management to assist with discharge planning and identification of hospital follow-up needs prior to discharge  -- Estimated LOS: 3-4 days  Marthe Dant, MD 04/10/2024, 11:04 AM

## 2024-04-10 NOTE — Progress Notes (Signed)
   04/10/24 1930  Psych Admission Type (Psych Patients Only)  Admission Status Involuntary  Psychosocial Assessment  Patient Complaints Anxiety;Suspiciousness  Eye Contact Fair  Facial Expression Anxious  Affect Anxious  Speech Logical/coherent  Interaction Minimal;Guarded  Motor Activity Slow  Appearance/Hygiene In scrubs  Behavior Characteristics Anxious;Cooperative;Guarded  Mood Preoccupied;Suspicious  Thought Process  Coherency Disorganized  Content Paranoia  Delusions Paranoid  Perception WDL  Hallucination None reported or observed  Judgment Impaired  Confusion Mild  Danger to Self  Current suicidal ideation? Denies  Agreement Not to Harm Self Yes  Description of Agreement verbal  Danger to Others  Danger to Others None reported or observed   Progress note   D: Pt seen in his room sitting on the bed in the dark. Pt denies SI, HI, AVH. Pt rates pain  0/10. Pt rates anxiety  some/10 and depression  0/10. Pt can be heard singing and talking to himself in his room. Pt states that the people who were trying to get at him outside the hospital have gained access and are here. This writer attempted to assure pt that only staff can gain access and others need permission. Pt still suspicious of explanation. No other concerns noted at this time.  A: Pt provided support and encouragement. Pt given scheduled medication as prescribed. PRNs as appropriate. Q15 min checks for safety.   R: Pt safe on the unit. Will continue to monitor.

## 2024-04-10 NOTE — Group Note (Signed)
 Date:  04/10/2024 Time:  5:24 PM  Group Topic/Focus:  Building Self Esteem:   The Focus of this group is helping patients become aware of the effects of self-esteem on their lives, the things they and others do that enhance or undermine their self-esteem, seeing the relationship between their level of self-esteem and the choices they make and learning ways to enhance self-esteem.    Participation Level:  Active  Participation Quality:  Appropriate  Affect:  Appropriate  Cognitive:  Appropriate  Insight: Appropriate  Engagement in Group:  Engaged  Modes of Intervention:  Activity  Additional Comments:    Pinchas Reither 04/10/2024, 5:24 PM

## 2024-04-10 NOTE — Group Note (Signed)
 Recreation Therapy Group Note   Group Topic:Other  Group Date: 04/10/2024 Start Time: 1510 End Time: 1550 Facilitators: Celestia Gauze E, LRT; Raynaldo Lum BIRCH, [place] Location: Courtyard  Group Description: Outdoor Recreation. Patients had the option to play corn hole, ring toss, bowling or listening to music while outside in the courtyard getting fresh air and sunlight. Patients helped water and prune the raised garden beds. LRT and patients discussed things that they enjoy doing in their free time outside of the hospital. LRT encouraged patients to drink water after being active and getting their heart rate up.   Goal Area(s) Addressed: Patient will identify leisure interests.  Patient will practice healthy decision making. Patient will engage in recreation activity.   Affect/Mood: Appropriate   Participation Level: Moderate   Participation Quality: Independent   Behavior: Calm and Cooperative   Speech/Thought Process: Coherent   Insight: Fair   Judgement: Fair    Modes of Intervention: Music and Open Conversation   Patient Response to Interventions:  Receptive   Education Outcome:  In group clarification offered    Clinical Observations/Individualized Feedback: Benjamin [name] was mostly active in their participation of session activities and group discussion. Pt interacted well with LRT and peers duration of session.    Plan: Continue to engage patient in RT group sessions 2-3x/week.   Gauze FORBES Celestia, LRT, CTRS 04/10/2024 5:42 PM

## 2024-04-10 NOTE — Plan of Care (Signed)

## 2024-04-10 NOTE — Group Note (Signed)
 Date:  04/10/2024 Time:  12:42 AM  Group Topic/Focus:  Wrap-Up Group:   The focus of this group is to help patients review their daily goal of treatment and discuss progress on daily workbooks.    Participation Level:  Active  Participation Quality:  Appropriate  Affect:  Appropriate  Cognitive:  Alert  Insight: Appropriate  Engagement in Group:  Engaged  Modes of Intervention:  Discussion  Additional Comments:    Benjamin [name] [name] 04/10/2024, 12:42 AM

## 2024-04-10 NOTE — Progress Notes (Signed)
 During last safety check, pt found to be standing in the corner of the room in the dark. Pt paranoid and hypervigilant. Believes people are coming to try and kill him here. Reassurance provided by staff member. Pt encouraged to get in the bed and get some rest. Will continue to monitor.

## 2024-04-10 NOTE — [facility] Group Notes (Signed)
 Spirituality Group   Group Goal: Support / Education around grief and loss    Group Description: Following introductions and group rules, group members engaged in facilitated group dialog and support around topic of loss, with particular support around experiences of loss in their lives. Group members identified types of loss (relationships / self / things) as well as patterns, circumstances, and changes that precipitate loss. Reflection invited on thoughts / feelings around loss, normalized grief responses, and recognized variety in grief experience. Group noted Worden's four tasks of grief in discussion. Group drew on Adlerian / Rogerian, narrative, MI, with Yalom's group therapy as a primary framework.   Observations: Benjamin [name] was an active participant in the group discussion. From his own faith he offered a universal invitation to peers for love.  Tate Jerkins L. Fredrica, M.Div

## 2024-04-10 NOTE — Group Note (Signed)
 Physical/Occupational Therapy Group Note  Group Topic: Yoga  Group Date: 04/10/2024 Start Time: 1400 End Time: 1427 Facilitators: Tamea Bai, Alm Hamilton, PT   Group Description: Group participated with series of yoga poses, designed to emphasize functional sitting balance, core stability, generalized flexibility and overall posture.  Incorporated deep breathing techniques with poses, working to promote relaxation, mindfulness and focus with targeted activities.   Discussed benefits of yoga in improving mood and self-esteem, reducing stress and anxiety, and promoting functional strength and balance for each participant.  Discussed ways to integrate into each participant's daily routine.  Provided handout with written and pictorial descriptions of included yoga movements to be utilized as appropriate outside of group time.  Therapeutic Goal(s):  Demonstrate safe ability to participate with yoga poses during group activity. Identify one benefit of participation with yoga poses as part of each participant's exercise/movement routine. Identify 1-2 individual poses that participant feels most beneficial to his/her needs and that he/she can easily replicate outside of group.  Individual Participation: Pt actively participated with the discussion and physical activity components of the session. Pt able to modify activities as needed with minimal cuing to avoid adverse symptoms/pain.  Participation Level: Active and Engaged   Participation Quality: Minimal Cues   Behavior: Appropriate   Speech/Thought Process: Organized and Relevant   Affect/Mood: Appropriate   Insight: Good   Judgement: Good   Modes of Intervention: Activity, Discussion, and Education  Patient Response to Interventions:  Attentive, Interested , and Receptive   Plan: Continue to engage patient in PT/OT groups 1 - 2x/week.  CHARM Hamilton Bertin PT, DPT 04/10/24, 4:51 PM

## 2024-04-10 NOTE — Group Note (Signed)
 Recreation Therapy Group Note   Group Topic:Animal Assisted Therapy   Group Date: 04/10/2024 Start Time: 1030 End Time: 1100 Facilitators: Celestia Jeoffrey BRAVO, LRT, CTRS Location: Dayroom  Group Description: AAA. Animal-Assisted Activity provides opportunities for motivational, educational, therapeutic and/or recreational benefits to enhance quality of life. Selinda and Rollo visited the unit to interact with patients.   Goal Areas Addressed:  Reduced anxiety and stress Improved mood Increased social interaction Enhanced communication skills Reduced loneliness and isolation Improved emotional regulation  Affect/Mood: N/A   Participation Level: Did not attend    Clinical Observations/Individualized Feedback: Patient did not attend group.   Plan: Continue to engage patient in RT group sessions 2-3x/week.   Jeoffrey BRAVO Celestia, LRT, CTRS 04/10/2024 1:35 PM

## 2024-04-10 NOTE — Plan of Care (Signed)
  Problem: Education: Goal: Knowledge of General Education information will improve Description: Including pain rating scale, medication(s)/side effects and non-pharmacologic comfort measures Outcome: Progressing   Problem: Health Behavior/Discharge Planning: Goal: Ability to manage health-related needs will improve Outcome: Progressing   Problem: Clinical Measurements: Goal: Ability to maintain clinical measurements within normal limits will improve Outcome: Progressing Goal: Will remain free from infection Outcome: Progressing Goal: Diagnostic test results will improve Outcome: Progressing Goal: Respiratory complications will improve Outcome: Progressing Goal: Cardiovascular complication will be avoided Outcome: Progressing   Problem: Activity: Goal: Risk for activity intolerance will decrease Outcome: Progressing   Problem: Nutrition: Goal: Adequate nutrition will be maintained Outcome: Progressing   Problem: Coping: Goal: Level of anxiety will decrease Outcome: Progressing   Problem: Elimination: Goal: Will not experience complications related to bowel motility Outcome: Progressing Goal: Will not experience complications related to urinary retention Outcome: Progressing   Problem: Pain Managment: Goal: General experience of comfort will improve and/or be controlled Outcome: Progressing   Problem: Safety: Goal: Ability to remain free from injury will improve Outcome: Progressing   Problem: Skin Integrity: Goal: Risk for impaired skin integrity will decrease Outcome: Progressing   Problem: Education: Goal: Knowledge of [place] [facility] Education information/materials will improve Outcome: Progressing Goal: Emotional status will improve Outcome: Progressing Goal: Mental status will improve Outcome: Progressing Goal: Verbalization of understanding the information provided will improve Outcome: Progressing   Problem: Activity: Goal: Interest or  engagement in activities will improve Outcome: Progressing Goal: Sleeping patterns will improve Outcome: Progressing   Problem: Coping: Goal: Ability to verbalize frustrations and anger appropriately will improve Outcome: Progressing Goal: Ability to demonstrate self-control will improve Outcome: Progressing   Problem: Health Behavior/Discharge Planning: Goal: Identification of resources available to assist in meeting health care needs will improve Outcome: Progressing Goal: Compliance with treatment plan for underlying cause of condition will improve Outcome: Progressing   Problem: Physical Regulation: Goal: Ability to maintain clinical measurements within normal limits will improve Outcome: Progressing   Problem: Safety: Goal: Periods of time without injury will increase Outcome: Progressing

## 2024-04-10 NOTE — Progress Notes (Signed)
 [facility] MD Progress Note  04/10/2024 11:53 PM Benjamin [name]  MRN:  [id number] Benjamin [name] is a 78 year old male who presents to the inpatient geriatric psych unit after jumping from a two story building. Patient was originally seen at [facility] who referred him to the ED who admitted him here on 03/27/2024. Patient reports that people from the [place] gang were trying to kill him and the only escape was through the bedroom window on the second floor. He currently lives in the house with [name] and his family and has been living with them for 3-4 years without any problems. On 03/19/2024 he went to the doctor and when he got back home the family wouldn't let him leave. He states that after he realized the gang was going to kill him he barricaded the door with his bed and jumped out the window. When he landed he ran away until one of his neighbors found him and called 911.   Subjective:  Chart reviewed, case discussed in multidisciplinary meeting, patient seen during rounds.  Today on interview patient is noted to be outdoors participating in recreational therapy.  He continues to express his request for placement and to be discharged.  Patient denies SI/HI/plan and denies hallucinations.  Per nursing report patient has intermittent episodes of paranoia but is not impairing his ADLs and is compliant with medications.  Sleep: Fair  Appetite:  Fair  Past Psychiatric History: see h&P  Family History:  Family History  Problem Relation Age of Onset   Colon cancer Neg Hx    Colon polyps Neg Hx    Stomach cancer Neg Hx    Esophageal cancer Neg Hx    Social History:  Social History   Substance and Sexual Activity  Alcohol Use No     Social History   Substance and Sexual Activity  Drug Use No    Social History   Socioeconomic History   Marital status: Single    Spouse name: Not on file   Number of children: Not on file   Years of education: Not on file   Highest  education level: Not on file  Occupational History   Not on file  Tobacco Use   Smoking status: Former    Types: Cigarettes   Smokeless tobacco: Never  Vaping Use   Vaping status: Never Used  Substance and Sexual Activity   Alcohol use: No   Drug use: No   Sexual activity: Not on file  Other Topics Concern   Not on file  Social History Narrative   Not on file   Social Drivers of Health   Financial Resource Strain: Not on file  Food Insecurity: No Food Insecurity (03/27/2024)   Hunger Vital Sign    Worried About Running Out of Food in the Last Year: Never true    Ran Out of Food in the Last Year: Never true  Transportation Needs: No Transportation Needs (03/27/2024)   PRAPARE - Administrator, Civil Service (Medical): No    Lack of Transportation (Non-Medical): No  Physical Activity: Not on file  Stress: Not on file  Social Connections: Moderately Integrated (03/27/2024)   Social Connection and Isolation Panel    Frequency of Communication with Friends and Family: Twice a week    Frequency of Social Gatherings with Friends and Family: Once a week    Attends Religious Services: 1 to 4 times per year    Active Member of Clubs or Organizations: No  Attends Banker Meetings: 1 to 4 times per year    Marital Status: Divorced   Past Medical History:  Past Medical History:  Diagnosis Date   Allergy    Arthritis    back   Prostate cancer (HCC) dx'd 2010   surg only    Past Surgical History:  Procedure Laterality Date   COLONOSCOPY     EYE SURGERY     PROSTATE SURGERY      Current Medications: Current Facility-Administered Medications  Medication Dose Route Frequency Provider Last Rate Last Admin   acetaminophen  (TYLENOL ) tablet 650 mg  650 mg Oral Q6H PRN Coleman, Carolyn H, NP       alum & mag hydroxide-simeth (MAALOX/MYLANTA) 200-200-20 MG/5ML suspension 30 mL  30 mL Oral Q4H PRN Coleman, Carolyn H, NP       docusate sodium  (COLACE) capsule  100 mg  100 mg Oral Daily Koi Yarbro, MD   100 mg at 04/10/24 1003   magnesium  hydroxide (MILK OF MAGNESIA) suspension 30 mL  30 mL Oral Daily PRN Coleman, Carolyn H, NP       OLANZapine  (ZYPREXA ) injection 5 mg  5 mg Intramuscular TID PRN Mardy Elveria DEL, NP       OLANZapine  (ZYPREXA ) tablet 5 mg  5 mg Oral QHS Mykaylah Ballman, MD   5 mg at 04/10/24 2105   OLANZapine  zydis (ZYPREXA ) disintegrating tablet 5 mg  5 mg Oral TID PRN Mardy Elveria DEL, NP        Lab Results:  No results found for this or any previous visit (from the past 48 hours).   Blood Alcohol level:  Lab Results  Component Value Date   [facility] <15 03/26/2024    Metabolic Disorder Labs: Lab Results  Component Value Date   HGBA1C 5.6 04/02/2024   MPG 114 04/02/2024   No results found for: PROLACTIN Lab Results  Component Value Date   CHOL 146 04/02/2024   TRIG 37 04/02/2024   HDL 43 04/02/2024   CHOLHDL 3.4 04/02/2024   VLDL 7 04/02/2024   LDLCALC 96 04/02/2024    Physical Findings: AIMS:  , ,  ,  ,    CIWA:    COWS:      Psychiatric Specialty Exam:  Presentation  General Appearance:  Appropriate for Environment; Bizarre  Eye Contact: Fair  Speech: Clear and Coherent  Speech Volume: Normal    Mood and Affect  Mood: Anxious  Affect: Congruent   Thought Process  Thought Processes: Disorganized  Descriptions of Associations:Intact  Orientation:Full (Time, Place and Person)  Thought Content: Paranoia at baseline Hallucinations: Denies  Ideas of Reference: Paranoia at baseline Suicidal Thoughts: Denies  Homicidal Thoughts: Denies   Sensorium  Memory: Immediate Fair; Recent Fair; Remote Fair  Judgment: Impaired  Insight: Shallow   Executive Functions  Concentration: Fair  Attention Span: Fair  Recall: [employer] of Knowledge: Fair  Language: Fair   Psychomotor Activity  Psychomotor Activity: No data recorded  Musculoskeletal: Strength &  Muscle Tone: within normal limits Gait & Station: normal Assets  Assets: Manufacturing systems engineer; Resilience    Physical Exam: Physical Exam Vitals and nursing note reviewed.    ROS Blood pressure (!) 141/106, pulse 78, temperature 97.9 F (36.6 C), resp. rate 18, height 6' (1.829 m), weight 100 kg, SpO2 100%. Body mass index is 29.91 kg/m.  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  Patient needs to be monitored closely for ongoing psychosis and paranoid delusions.   Differential diagnosis include: unspecified psychosis, delusion disorder, dementia, schizophrenia/schizoaffective disorder   Treatment Plan Summary:   Safety and Monitoring:             -- Voluntary admission to inpatient psychiatric unit for safety, stabilization and treatment             -- Daily contact with patient to assess and evaluate symptoms and progress in treatment             -- Patient's case to be discussed in multi-disciplinary team meeting             -- Observation Level: q15 minute checks             -- Vital signs:  q12 hours             -- Precautions: suicide, elopement, and assault   2. Psychiatric Diagnoses and Treatment:               Zyprexa  5 mg nightly to help with the paranoid delusions. -- The risks/benefits/side-effects/alternatives to this medication were discussed in detail with the patient and time was given for questions. The patient consents to medication trial.                -- Metabolic profile and EKG monitoring obtained while on an atypical antipsychotic (BMI: Lipid Panel: HbgA1c: QTc:)              -- Encouraged patient to participate in unit milieu and in scheduled group therapies                            3. Medical Issues Being Addressed:     No urgent medical needs noted  4. Discharge Planning:   -- Social work and case management to assist with discharge planning and identification of hospital follow-up needs prior to discharge  -- Estimated LOS: 3-4 days  Everlina Gotts, MD 04/10/2024, 11:53 PM

## 2024-04-11 DIAGNOSIS — F22 Delusional disorders: Secondary | ICD-10-CM | POA: Diagnosis not present

## 2024-04-11 NOTE — Progress Notes (Signed)
(  Sleep Hours) -8.5 (Any PRNs that were needed, meds refused, or side effects to meds)- none (Any disturbances and when (visitation, over night)- pt found standing in corner of room during early part of night. Pt still paranoid and believes people trying to kill him are at the hospital. Did cry out a couple of times during the night. (Concerns raised by the patient)- afraid people after him will get to him in hospital (SI/HI/AVH)- denies

## 2024-04-11 NOTE — Group Note (Signed)

## 2024-04-11 NOTE — Progress Notes (Signed)
   04/11/24 1500  Psych Admission Type (Psych Patients Only)  Admission Status Involuntary  Psychosocial Assessment  Patient Complaints Anxiety;Suspiciousness  Eye Contact Fair  Facial Expression Anxious  Affect Appropriate to circumstance  Speech Logical/coherent  Interaction Assertive  Motor Activity Slow  Appearance/Hygiene In scrubs  Behavior Characteristics Cooperative;Anxious;Guarded  Mood Preoccupied;Anxious;Suspicious  Thought Process  Coherency WDL  Content Paranoia  Delusions Paranoid  Perception WDL  Hallucination None reported or observed  Judgment Impaired  Confusion WDL  Danger to Self  Current suicidal ideation? Denies  Agreement Not to Harm Self Yes  Description of Agreement verbal  Danger to Others  Danger to Others None reported or observed

## 2024-04-11 NOTE — Plan of Care (Signed)

## 2024-04-11 NOTE — Group Note (Signed)
 Date:  04/11/2024 Time:  11:19 PM  Group Topic/Focus:  Wrap-Up Group:   The focus of this group is to help patients review their daily goal of treatment and discuss progress on daily workbooks.    Participation Level:  Active  Participation Quality:  Appropriate  Affect:  Appropriate  Cognitive:  Alert  Insight: Appropriate  Engagement in Group:  Engaged  Modes of Intervention:  Discussion  Additional Comments:    Benjamin [name] 04/11/2024, 11:19 PM

## 2024-04-11 NOTE — Group Note (Signed)
 Date:  04/11/2024 Time:  10:56 AM  Group Topic/Focus:  Coping With Mental Health Crisis:   The purpose of this group is to help patients identify strategies for coping with mental health crisis.  Group discusses possible causes of crisis and ways to manage them effectively.    Participation Level:  Did Not Attend   Arland Nutting 04/11/2024, 10:56 AM

## 2024-04-11 NOTE — Plan of Care (Signed)
  Problem: Health Behavior/Discharge Planning: Goal: Ability to manage health-related needs will improve Outcome: Progressing   Problem: Clinical Measurements: Goal: Ability to maintain clinical measurements within normal limits will improve Outcome: Progressing Goal: Will remain free from infection Outcome: Progressing   Problem: Activity: Goal: Risk for activity intolerance will decrease Outcome: Progressing   Problem: Nutrition: Goal: Adequate nutrition will be maintained Outcome: Progressing   Problem: Elimination: Goal: Will not experience complications related to bowel motility Outcome: Progressing   Problem: Safety: Goal: Ability to remain free from injury will improve Outcome: Progressing   Problem: Activity: Goal: Sleeping patterns will improve Outcome: Progressing

## 2024-04-11 NOTE — Progress Notes (Signed)
 [facility] MD Progress Note  04/11/2024 10:39 PM Akaash Vandewater  MRN:  [id number] Benjamin [name] is a 78 year old male who presents to the inpatient geriatric psych unit after jumping from a two story building. Patient was originally seen at [name] who referred him to the ED who admitted him here on 03/27/2024. Patient reports that people from the [place] gang were trying to kill him and the only escape was through the bedroom window on the second floor. He currently lives in the house with [name] and his family and has been living with them for 3-4 years without any problems. On 03/19/2024 he went to the doctor and when he got back home the family wouldn't let him leave. He states that after he realized the gang was going to kill him he barricaded the door with his bed and jumped out the window. When he landed he ran away until one of his neighbors found him and called 911.   Subjective:  Chart reviewed, case discussed in multidisciplinary meeting, patient seen during rounds.  Patient is noted to be resting in bed.  He made some paranoid statements saying last night the nurse tried to harm him but is unable to give any details.  He reports that he understands the situation and is aware of the people who is monitoring him.  He denies any auditory/visual hallucinations and denies SI/HI/plan.  He continues to remain focused on discharge but understands that he is homeless and needs to work with the Child psychotherapist and finding a safe placement for him Sleep: Fair  Appetite:  Fair  Past Psychiatric History: see h&P  Family History:  Family History  Problem Relation Age of Onset   Colon cancer Neg Hx    Colon polyps Neg Hx    Stomach cancer Neg Hx    Esophageal cancer Neg Hx    Social History:  Social History   Substance and Sexual Activity  Alcohol Use No     Social History   Substance and Sexual Activity  Drug Use No    Social History   Socioeconomic History   Marital  status: Single    Spouse name: Not on file   Number of children: Not on file   Years of education: Not on file   Highest education level: Not on file  Occupational History   Not on file  Tobacco Use   Smoking status: Former    Types: Cigarettes   Smokeless tobacco: Never  Vaping Use   Vaping status: Never Used  Substance and Sexual Activity   Alcohol use: No   Drug use: No   Sexual activity: Not on file  Other Topics Concern   Not on file  Social History Narrative   Not on file   Social Drivers of Health   Financial Resource Strain: Not on file  Food Insecurity: No Food Insecurity (03/27/2024)   Hunger Vital Sign    Worried About Running Out of Food in the Last Year: Never true    Ran Out of Food in the Last Year: Never true  Transportation Needs: No Transportation Needs (03/27/2024)   PRAPARE - Administrator, Civil Service (Medical): No    Lack of Transportation (Non-Medical): No  Physical Activity: Not on file  Stress: Not on file  Social Connections: Moderately Integrated (03/27/2024)   Social Connection and Isolation Panel    Frequency of Communication with Friends and Family: Twice a week    Frequency of Social Gatherings  with Friends and Family: Once a week    Attends Religious Services: 1 to 4 times per year    Active Member of Clubs or Organizations: No    Attends Engineer, structural: 1 to 4 times per year    Marital Status: Divorced   Past Medical History:  Past Medical History:  Diagnosis Date   Allergy    Arthritis    back   Prostate cancer (HCC) dx'd 2010   surg only    Past Surgical History:  Procedure Laterality Date   COLONOSCOPY     EYE SURGERY     PROSTATE SURGERY      Current Medications: Current Facility-Administered Medications  Medication Dose Route Frequency Provider Last Rate Last Admin   acetaminophen  (TYLENOL ) tablet 650 mg  650 mg Oral Q6H PRN Coleman, Carolyn H, NP       alum & mag hydroxide-simeth  (MAALOX/MYLANTA) 200-200-20 MG/5ML suspension 30 mL  30 mL Oral Q4H PRN Coleman, Carolyn H, NP       docusate sodium  (COLACE) capsule 100 mg  100 mg Oral Daily Kalieb Freeland, MD   100 mg at 04/10/24 1003   magnesium  hydroxide (MILK OF MAGNESIA) suspension 30 mL  30 mL Oral Daily PRN Coleman, Carolyn H, NP       OLANZapine  (ZYPREXA ) injection 5 mg  5 mg Intramuscular TID PRN Mardy Elveria DEL, NP       OLANZapine  (ZYPREXA ) tablet 5 mg  5 mg Oral QHS Syrita Dovel, MD   5 mg at 04/11/24 2128   OLANZapine  zydis (ZYPREXA ) disintegrating tablet 5 mg  5 mg Oral TID PRN Mardy Elveria DEL, NP        Lab Results:  No results found for this or any previous visit (from the past 48 hours).   Blood Alcohol level:  Lab Results  Component Value Date   [name] <15 03/26/2024    Metabolic Disorder Labs: Lab Results  Component Value Date   HGBA1C 5.6 04/02/2024   MPG 114 04/02/2024   No results found for: PROLACTIN Lab Results  Component Value Date   CHOL 146 04/02/2024   TRIG 37 04/02/2024   HDL 43 04/02/2024   CHOLHDL 3.4 04/02/2024   VLDL 7 04/02/2024   LDLCALC 96 04/02/2024    Physical Findings: AIMS:  , ,  ,  ,    CIWA:    COWS:      Psychiatric Specialty Exam:  Presentation  General Appearance:  Appropriate for Environment; Bizarre  Eye Contact: Fair  Speech: Clear and Coherent  Speech Volume: Normal    Mood and Affect  Mood: Anxious  Affect: Congruent   Thought Process  Thought Processes: Disorganized  Descriptions of Associations:Intact  Orientation:Full (Time, Place and Person)  Thought Content: Paranoia at baseline Hallucinations: Denies  Ideas of Reference: Paranoia at baseline Suicidal Thoughts: Denies  Homicidal Thoughts: Denies   Sensorium  Memory: Immediate Fair; Recent Fair; Remote Fair  Judgment: Impaired  Insight: Shallow   Executive Functions  Concentration: Fair  Attention Span: Fair  Recall: [employer] of  Knowledge: Fair  Language: Fair   Psychomotor Activity  Psychomotor Activity: No data recorded  Musculoskeletal: Strength & Muscle Tone: within normal limits Gait & Station: normal Assets  Assets: Manufacturing systems engineer; Resilience    Physical Exam: Physical Exam Vitals and nursing note reviewed.    ROS Blood pressure (!) 151/69, pulse 73, temperature 99 F (37.2 C), resp. rate 16, height 6' (1.829 m), weight 100  kg, SpO2 100%. Body mass index is 29.91 kg/m.  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  Patient needs to be monitored closely for ongoing psychosis and paranoid delusions.   Differential diagnosis include: unspecified psychosis, delusion disorder, dementia, schizophrenia/schizoaffective disorder   Treatment Plan Summary: Requested the social work team to reach out to APS for safety concerns in the community as patient has chronic paranoia and confusion and is currently homeless   Safety and Monitoring:             -- Voluntary admission to inpatient psychiatric unit for safety, stabilization and treatment             -- Daily contact with patient to assess and evaluate symptoms and progress in treatment             -- Patient's case to be discussed in multi-disciplinary team meeting             -- Observation Level: q15 minute checks             -- Vital signs:  q12 hours             -- Precautions: suicide, elopement, and assault   2. Psychiatric Diagnoses and Treatment:               Zyprexa  5 mg nightly to help with the paranoid delusions. -- The risks/benefits/side-effects/alternatives to this medication were discussed in detail with the patient and time was given for questions. The patient consents to medication trial.                 -- Metabolic profile and EKG monitoring obtained while on an atypical antipsychotic (BMI: Lipid Panel: HbgA1c: QTc:)              -- Encouraged patient to participate in unit milieu and in scheduled group therapies                            3. Medical Issues Being Addressed:    No urgent medical needs noted  4. Discharge Planning:   -- Social work and case management to assist with discharge planning and identification of hospital follow-up needs prior to discharge  -- Estimated LOS: 3-4 days  Allyn Foil, MD 04/11/2024, 10:39 PM

## 2024-04-11 NOTE — BHH Counselor (Signed)
 CSW contacted [facility] APS to submit report [phone])   CSW submitted report to [facility], MSW, [name] 04/11/2024 11:34 AM

## 2024-04-11 NOTE — BHH Counselor (Signed)
 CSW received call from [place] at adult protective services. Yolanda left CSW VM reporting that case will be screened IN and that a social worker will be assigned the case.   Lum Croft, MSW, [place] 04/11/2024 3:25 PM

## 2024-04-12 DIAGNOSIS — F22 Delusional disorders: Secondary | ICD-10-CM | POA: Diagnosis not present

## 2024-04-12 MED ORDER — OLANZAPINE 5 MG PO TBDP
10.0000 mg | ORAL_TABLET | Freq: Every day | ORAL | Status: DC
  Administered 2024-04-13 – 2024-04-17 (×5): 10 mg via ORAL
  Filled 2024-04-12 (×5): qty 2

## 2024-04-12 MED ORDER — OLANZAPINE 5 MG PO TBDP
5.0000 mg | ORAL_TABLET | Freq: Every day | ORAL | Status: DC
  Filled 2024-04-12: qty 1

## 2024-04-12 MED ORDER — OLANZAPINE 5 MG PO TABS
2.5000 mg | ORAL_TABLET | Freq: Every morning | ORAL | Status: DC
  Administered 2024-04-16 – 2024-04-17 (×2): 2.5 mg via ORAL
  Filled 2024-04-12 (×4): qty 1

## 2024-04-12 NOTE — Progress Notes (Signed)
 Patient is suspicious, paranoid and irritable.  Denies SI/HI. Denies pain.  Has not left his room and refused all 3 meals.   Pt can be heard talking to himself from nurse's station.  Suspicious, but compliant with medication. 15 min checks in place for safety.

## 2024-04-12 NOTE — Group Note (Signed)
 Date:  04/12/2024 Time:  10:35 AM  Group Topic/Focus:  Movement Therapy    Participation Level:  Did Not Attend    [name] 04/12/2024, 10:35 AM

## 2024-04-12 NOTE — Progress Notes (Signed)
 [facility] MD Progress Note  04/12/2024 10:03 PM Benjamin [name]  MRN:  [id number] Benjamin [name] is a 78 year old male who presents to the inpatient geriatric psych unit after jumping from a two story building. Patient was originally seen at [facility] who referred him to the ED who admitted him here on 03/27/2024. Patient reports that people from the [place] gang were trying to kill him and the only escape was through the bedroom window on the second floor. He currently lives in the house with [name] and his family and has been living with them for 3-4 years without any problems. On 03/19/2024 he went to the doctor and when he got back home the family wouldn't let him leave. He states that after he realized the gang was going to kill him he barricaded the door with his bed and jumped out the window. When he landed he ran away until one of his neighbors found him and called 911.   Subjective:  Chart reviewed, case discussed in multidisciplinary meeting, patient seen during rounds.  Patient is noted to be sitting in the room.  He is more paranoid today claiming that the doctor is working with the nurses and the doctor knows what is going on but refuses to express his concerns about what happened.  He is visible but refused to give details.  Per nursing patient remains irritable towards the staff.  He is taking his medications with no reported side effects.  He denies SI/HI/plan.    Sleep: Fair  Appetite:  Fair  Past Psychiatric History: see h&P  Family History:  Family History  Problem Relation Age of Onset   Colon cancer Neg Hx    Colon polyps Neg Hx    Stomach cancer Neg Hx    Esophageal cancer Neg Hx    Social History:  Social History   Substance and Sexual Activity  Alcohol Use No     Social History   Substance and Sexual Activity  Drug Use No    Social History   Socioeconomic History   Marital status: Single    Spouse name: Not on file   Number of children: Not  on file   Years of education: Not on file   Highest education level: Not on file  Occupational History   Not on file  Tobacco Use   Smoking status: Former    Types: Cigarettes   Smokeless tobacco: Never  Vaping Use   Vaping status: Never Used  Substance and Sexual Activity   Alcohol use: No   Drug use: No   Sexual activity: Not on file  Other Topics Concern   Not on file  Social History Narrative   Not on file   Social Drivers of Health   Financial Resource Strain: Not on file  Food Insecurity: No Food Insecurity (03/27/2024)   Hunger Vital Sign    Worried About Running Out of Food in the Last Year: Never true    Ran Out of Food in the Last Year: Never true  Transportation Needs: No Transportation Needs (03/27/2024)   PRAPARE - Administrator, Civil Service (Medical): No    Lack of Transportation (Non-Medical): No  Physical Activity: Not on file  Stress: Not on file  Social Connections: Moderately Integrated (03/27/2024)   Social Connection and Isolation Panel    Frequency of Communication with Friends and Family: Twice a week    Frequency of Social Gatherings with Friends and Family: Once a week  Attends Religious Services: 1 to 4 times per year    Active Member of Clubs or Organizations: No    Attends Banker Meetings: 1 to 4 times per year    Marital Status: Divorced   Past Medical History:  Past Medical History:  Diagnosis Date   Allergy    Arthritis    back   Prostate cancer (HCC) dx'd 2010   surg only    Past Surgical History:  Procedure Laterality Date   COLONOSCOPY     EYE SURGERY     PROSTATE SURGERY      Current Medications: Current Facility-Administered Medications  Medication Dose Route Frequency Provider Last Rate Last Admin   acetaminophen  (TYLENOL ) tablet 650 mg  650 mg Oral Q6H PRN Coleman, Carolyn H, NP       alum & mag hydroxide-simeth (MAALOX/MYLANTA) 200-200-20 MG/5ML suspension 30 mL  30 mL Oral Q4H PRN Coleman,  Carolyn H, NP       docusate sodium  (COLACE) capsule 100 mg  100 mg Oral Daily Harsimran Westman, MD   100 mg at 04/12/24 0901   magnesium  hydroxide (MILK OF MAGNESIA) suspension 30 mL  30 mL Oral Daily PRN Coleman, Carolyn H, NP       OLANZapine  (ZYPREXA ) injection 5 mg  5 mg Intramuscular TID PRN Mardy Elveria DEL, NP       NOREEN ON 04/13/2024] OLANZapine  (ZYPREXA ) tablet 2.5 mg  2.5 mg Oral q morning Tomorrow Dehaas, MD       OLANZapine  zydis (ZYPREXA ) disintegrating tablet 5 mg  5 mg Oral TID PRN Coleman, Carolyn H, NP       OLANZapine  zydis (ZYPREXA ) disintegrating tablet 5 mg  5 mg Oral QHS Dewaun Kinzler, MD   5 mg at 04/12/24 2134    Lab Results:  No results found for this or any previous visit (from the past 48 hours).   Blood Alcohol level:  Lab Results  Component Value Date   [facility] <15 03/26/2024    Metabolic Disorder Labs: Lab Results  Component Value Date   HGBA1C 5.6 04/02/2024   MPG 114 04/02/2024   No results found for: PROLACTIN Lab Results  Component Value Date   CHOL 146 04/02/2024   TRIG 37 04/02/2024   HDL 43 04/02/2024   CHOLHDL 3.4 04/02/2024   VLDL 7 04/02/2024   LDLCALC 96 04/02/2024    Physical Findings: AIMS:  , ,  ,  ,    CIWA:    COWS:      Psychiatric Specialty Exam:  Presentation  General Appearance:  Appropriate for Environment; Bizarre  Eye Contact: Fair  Speech: Clear and Coherent  Speech Volume: Normal    Mood and Affect  Mood: Anxious  Affect: Congruent   Thought Process  Thought Processes: Disorganized  Descriptions of Associations:Intact  Orientation:Full (Time, Place and Person)  Thought Content: Paranoia at baseline Hallucinations: Denies  Ideas of Reference: Paranoia at baseline Suicidal Thoughts: Denies  Homicidal Thoughts: Denies   Sensorium  Memory: Immediate Fair; Recent Fair; Remote Fair  Judgment: Impaired  Insight: Shallow   Executive Functions   Concentration: Fair  Attention Span: Fair  Recall: [employer] of Knowledge: Fair  Language: Fair   Psychomotor Activity  Psychomotor Activity: No data recorded  Musculoskeletal: Strength & Muscle Tone: within normal limits Gait & Station: normal Assets  Assets: Manufacturing systems engineer; Resilience    Physical Exam: Physical Exam Vitals and nursing note reviewed.    ROS Blood pressure (!) 143/85, pulse  75, temperature (!) 97 F (36.1 C), resp. rate 18, height 6' (1.829 m), weight 100 kg, SpO2 100%. Body mass index is 29.91 kg/m.  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  Patient needs to be monitored closely for ongoing psychosis and paranoid delusions.  Given patient's confusion and chronic paranoia, no family support, APS report has been made and it got screened him for further evaluation by APS.   Differential diagnosis include: unspecified psychosis, delusion disorder, dementia, schizophrenia/schizoaffective disorder   Treatment Plan Summary: Requested the social work team to reach out to APS for safety concerns in the community as patient has chronic paranoia and confusion and is currently homeless   Safety and Monitoring:             -- Voluntary admission to inpatient psychiatric unit for safety, stabilization and treatment             -- Daily contact with patient to assess and evaluate symptoms and progress in treatment             -- Patient's case to be discussed in multi-disciplinary team meeting             -- Observation Level: q15 minute checks             -- Vital signs:  q12 hours             -- Precautions: suicide, elopement, and assault   2. Psychiatric Diagnoses and Treatment:               Zyprexa  5 mg nightly to  help with the paranoid delusions. -- The risks/benefits/side-effects/alternatives to this medication were discussed in detail with the patient and time was given for questions. The patient consents to medication trial.                -- Metabolic profile and EKG monitoring obtained while on an atypical antipsychotic (BMI: Lipid Panel: HbgA1c: QTc:)              -- Encouraged patient to participate in unit milieu and in scheduled group therapies                            3. Medical Issues Being Addressed:    No urgent medical needs noted  4. Discharge Planning:   -- Social work and case management to assist with discharge planning and identification of hospital follow-up needs prior to discharge  -- Estimated LOS: 3-4 days  Allyn Foil, MD 04/12/2024, 10:03 PM

## 2024-04-12 NOTE — BH IP Treatment Plan (Signed)
 Interdisciplinary Treatment and Diagnostic Plan Update  04/12/2024 Time of Session: 2:30 PM  Benjamin [name] MRN: [id number]  Principal Diagnosis: Delusional disorder [facility])  Secondary Diagnoses: Principal Problem:   Delusional disorder (HCC)   Current Medications:  Current Facility-Administered Medications  Medication Dose Route Frequency Provider Last Rate Last Admin   acetaminophen  (TYLENOL ) tablet 650 mg  650 mg Oral Q6H PRN Coleman, Carolyn H, NP       alum & mag hydroxide-simeth (MAALOX/MYLANTA) 200-200-20 MG/5ML suspension 30 mL  30 mL Oral Q4H PRN Coleman, Carolyn H, NP       docusate sodium  (COLACE) capsule 100 mg  100 mg Oral Daily Jadapalle, Sree, MD   100 mg at 04/12/24 0901   magnesium  hydroxide (MILK OF MAGNESIA) suspension 30 mL  30 mL Oral Daily PRN Coleman, Carolyn H, NP       OLANZapine  (ZYPREXA ) injection 5 mg  5 mg Intramuscular TID PRN Mardy Elveria DEL, NP       [START ON 04/13/2024] OLANZapine  (ZYPREXA ) tablet 2.5 mg  2.5 mg Oral q morning Jadapalle, Sree, MD       OLANZapine  zydis (ZYPREXA ) disintegrating tablet 5 mg  5 mg Oral TID PRN Coleman, Carolyn H, NP       OLANZapine  zydis (ZYPREXA ) disintegrating tablet 5 mg  5 mg Oral QHS Jadapalle, Sree, MD       PTA Medications: Medications Prior to Admission  Medication Sig Dispense Refill Last Dose/Taking   docusate sodium  (COLACE) 100 MG capsule Take 100 mg by mouth daily.      omega-3 acid ethyl esters (LOVAZA) 1 g capsule Take 1 g by mouth daily.       Patient Stressors: Traumatic event    Patient Strengths: Communication skills   Treatment Modalities: Medication Management, Group therapy, Case management,  1 to 1 session with clinician, Psychoeducation, Recreational therapy.   Physician Treatment Plan for Primary Diagnosis: Delusional disorder [facility]) Long Term Goal(s): Improvement in symptoms so as ready for discharge   Short Term Goals: Ability to identify changes in lifestyle to reduce recurrence of  condition will improve Ability to verbalize feelings will improve Ability to disclose and discuss suicidal ideas Ability to demonstrate self-control will improve Ability to identify and develop effective coping behaviors will improve  Medication Management: Evaluate patient's response, side effects, and tolerance of medication regimen.  Therapeutic Interventions: 1 to 1 sessions, Unit Group sessions and Medication administration.  Evaluation of Outcomes: Not Progressing  Physician Treatment Plan for Secondary Diagnosis: Principal Problem:   Delusional disorder (HCC)  Long Term Goal(s): Improvement in symptoms so as ready for discharge   Short Term Goals: Ability to identify changes in lifestyle to reduce recurrence of condition will improve Ability to verbalize feelings will improve Ability to disclose and discuss suicidal ideas Ability to demonstrate self-control will improve Ability to identify and develop effective coping behaviors will improve     Medication Management: Evaluate patient's response, side effects, and tolerance of medication regimen.  Therapeutic Interventions: 1 to 1 sessions, Unit Group sessions and Medication administration.  Evaluation of Outcomes: Not Progressing   RN Treatment Plan for Primary Diagnosis: Delusional disorder (HCC) Long Term Goal(s): Knowledge of disease and therapeutic regimen to maintain health will improve  Short Term Goals: Ability to remain free from injury will improve, Ability to verbalize frustration and anger appropriately will improve, Ability to demonstrate self-control, Ability to participate in decision making will improve, Ability to verbalize feelings will improve, Ability to disclose and discuss suicidal ideas,  Ability to identify and develop effective coping behaviors will improve, and Compliance with prescribed medications will improve  Medication Management: RN will administer medications as ordered by provider, will assess  and evaluate patient's response and provide education to patient for prescribed medication. RN will report any adverse and/or side effects to prescribing provider.  Therapeutic Interventions: 1 on 1 counseling sessions, Psychoeducation, Medication administration, Evaluate responses to treatment, Monitor vital signs and CBGs as ordered, Perform/monitor CIWA, COWS, AIMS and Fall Risk screenings as ordered, Perform wound care treatments as ordered.  Evaluation of Outcomes: Not Progressing   LCSW Treatment Plan for Primary Diagnosis: Delusional disorder [facility]) Long Term Goal(s): Safe transition to appropriate next level of care at discharge, Engage patient in therapeutic group addressing interpersonal concerns.  Short Term Goals: Engage patient in aftercare planning with referrals and resources, Increase social support, Increase ability to appropriately verbalize feelings, Increase emotional regulation, Facilitate acceptance of mental health diagnosis and concerns, Facilitate patient progression through stages of change regarding substance use diagnoses and concerns, Identify triggers associated with mental health/substance abuse issues, and Increase skills for wellness and recovery  Therapeutic Interventions: Assess for all discharge needs, 1 to 1 time with Social worker, Explore available resources and support systems, Assess for adequacy in community support network, Educate family and significant other(s) on suicide prevention, Complete Psychosocial Assessment, Interpersonal group therapy.  Evaluation of Outcomes: Not Progressing   Progress in Treatment: Attending groups: No Participating in groups: No Taking medication as prescribed: Yes. Toleration medication: Yes. Family/Significant other contact made: Yes, individual(s) contacted:  SPE completed with Luthor Weathers, pt brother [phone]) on 03/31/2024. Patient understands diagnosis: Yes. Discussing patient identified problems/goals with  staff: Yes. Medical problems stabilized or resolved: Yes. Denies suicidal/homicidal ideation: Yes. Issues/concerns per patient self-inventory: No. Other:    New problem(s) identified: No, Describe:  None identified Update 04/02/24: No changes at this time. Update 04/07/24: No changes at this time  Update 04/12/24: No changes at this time      New Short Term/Long Term Goal(s): elimination of symptoms of psychosis, medication management for mood stabilization; elimination of SI thoughts; development of comprehensive mental wellness plan. Update 04/02/24: No changes at this time. Update 04/07/24: No changes at this time. Update 04/12/24: No changes at this time    Patient Goals:  Get out of here, I want to get healed up so I can get out of here Update 04/02/24: No changes at this time. Update 04/07/24: No changes at this time .Update 04/12/24: No changes at this time    Discharge Plan or Barriers: CSW will assist with appropriate discharge planning  Update 04/02/24: No changes at this time. Update 04/07/24: No changes at this time.Update 04/12/24: No changes at this time    Reason for Continuation of Hospitalization: Delusions  Medication stabilization   Estimated Length of Stay: 1 to 7 days Update 04/02/24: TBD. Update 04/07/24: TBD Update 04/12/24:TBD  Last 3 [place] Suicide Severity Risk Score: Flowsheet Row Admission (Current) from 03/27/2024 in [facility] ED from 03/26/2024 in [facility] Emergency Department at [facility] ED from 03/25/2024 in [facility]  C-SSRS RISK CATEGORY No Risk No Risk No Risk    Last [facility] 2/9 Scores:     No data to display          Scribe for Treatment Team: Lum JONETTA Raynaldo ISRAEL 04/12/2024 4:28 PM

## 2024-04-12 NOTE — Plan of Care (Signed)

## 2024-04-12 NOTE — Group Note (Signed)
 LCSW Group Therapy Note  Group Date: 04/12/2024 Start Time: 1330 End Time: 1400   Type of Therapy and Topic:  Group Therapy - Healthy vs Unhealthy Coping Skills  Participation Level:  Did Not Attend   Description of Group The focus of this group was to determine what unhealthy coping techniques typically are used by group members and what healthy coping techniques would be helpful in coping with various problems. Patients were guided in becoming aware of the differences between healthy and unhealthy coping techniques. Patients were asked to identify 2-3 healthy coping skills they would like to learn to use more effectively.  Therapeutic Goals Patients learned that coping is what human beings do all day long to deal with various situations in their lives Patients defined and discussed healthy vs unhealthy coping techniques Patients identified their preferred coping techniques and identified whether these were healthy or unhealthy Patients determined 2-3 healthy coping skills they would like to become more familiar with and use more often. Patients provided support and ideas to each other   Summary of Patient Progress:  X   Therapeutic Modalities Cognitive Behavioral Therapy Motivational Interviewing  Benjamin [name], [place] 04/12/2024  1:43 PM

## 2024-04-12 NOTE — Group Note (Signed)
 Recreation Therapy Group Note   Group Topic:General Recreation  Group Date: 04/12/2024 Start Time: 1100 End Time: 1135 Facilitators: Celestia Jeoffrey BRAVO, LRT, CTRS Location: Courtyard  Group Description: Outdoor Recreation. Patients had the option to play corn hole, ring toss, bowling or listening to music while outside in the courtyard getting fresh air and sunlight. Patients helped water and prune the raised garden beds. LRT and patients discussed things that they enjoy doing in their free time outside of the hospital. LRT encouraged patients to drink water after being active and getting their heart rate up.   Goal Area(s) Addressed: Patient will identify leisure interests.  Patient will practice healthy decision making. Patient will engage in recreation activity.   Affect/Mood: N/A   Participation Level: Did not attend    Clinical Observations/Individualized Feedback: Patient did not attend group.   Plan: Continue to engage patient in RT group sessions 2-3x/week.   Jeoffrey BRAVO Celestia, LRT, CTRS 04/12/2024 2:29 PM

## 2024-04-12 NOTE — Progress Notes (Signed)
 Calm and cooperative. Flat affect. W/d to self. Visible in dayroom. Attends group. Limited interaction with peers and staff. Po med compliant. No behavior issues noted. Denies SI/HI/AVH.    04/11/24 2100  Psych Admission Type (Psych Patients Only)  Admission Status Involuntary  Psychosocial Assessment  Patient Complaints Suspiciousness  Eye Contact Fair  Facial Expression Flat  Affect Flat  Speech Soft  Interaction Assertive  Motor Activity Slow  Appearance/Hygiene In scrubs  Behavior Characteristics Guarded  Mood Preoccupied  Thought Process  Coherency WDL  Content Paranoia  Delusions Paranoid  Perception WDL  Hallucination None reported or observed  Judgment Impaired  Confusion None  Danger to Self  Current suicidal ideation? Denies

## 2024-04-12 NOTE — Plan of Care (Signed)
   Problem: Activity: Goal: Risk for activity intolerance will decrease Outcome: Progressing   Problem: Coping: Goal: Level of anxiety will decrease Outcome: Progressing

## 2024-04-12 NOTE — Group Note (Signed)

## 2024-04-12 NOTE — Progress Notes (Signed)
 Pt remains disorganized, paranoid and confused. Pt pacing in his room and talking to himself.  Po medication administered as ordered, pt immediately jumped up, ran to the bathroom and was observed spitting out the medication with water. Attempt to redirect, pt states I need to rinse out my mouth with warm water. Pt continues to present with confusion, agitation and disorganized speech. Hyper-religious and paranoid states I see the blood of Jesus, they are after me and I hear Chris's voice talking to me over the loud speaker. Multiple attempts to redirect unsuccessful. Zyprexa  5mg  IM injection administered as ordered PRN. Security notified for support to ensure safety of the patient and others. Q15 min checks provided. No c/o pain/discomfort noted.

## 2024-04-13 DIAGNOSIS — F22 Delusional disorders: Secondary | ICD-10-CM | POA: Diagnosis not present

## 2024-04-13 NOTE — Group Note (Signed)
 Physical/Occupational Therapy Group Note  Group Topic: Neurographic Art  Group Date: 04/13/2024 Start Time: 1320 End Time: 1410 Facilitators: Clive Warren CROME, OT   Group Description: Group participated with Neurographic art activity, using watercolor paints to facilitate creative expression and meditation/relaxation for each individual.  Incorporated bimanual coordination, mental focus, emotional processing, task/command following and relaxation techniques as appropriate.  Patients engaged socially with therapist and other group participants throughout session. Allowed to ask questions as appropriate, and encouraged to identify ways they could use/share their creations with themselves and others.  Therapeutic Goal(s):  Demonstrate ability to independently manipulate utensils required to participate with and complete activity. Demonstrate ability to cognitively focus on task and follow commands necessary for completion. Demonstrate use of art as an outlet for emotional processing and expression. Identify and demonstrate importance of relaxation, neural calming and meditation for improved participation with life groups.  Individual Participation: Did not attend.   Participation Level: Did not attend   Participation Quality:   Behavior:   Speech/Thought Process:   Affect/Mood:   Insight:   Judgement:   Modes of Intervention:   Patient Response to Interventions:    Plan: Continue to engage patient in PT/OT groups 1 - 2x/week.  Hinton Luellen R., MPH, MS, OTR/L ascom [phone] 04/13/24, 4:04 PM

## 2024-04-13 NOTE — BHH Counselor (Signed)
 CSW received message from nursing staff that reports [facility] APS was at the hospital to see pt.   CSW informed nursing staff that APS would need to reschedule as they have not prearranged a visit.   CSW received a call from caseworker [name] who reports she is the caseworker assigned to pt's case.   Arch reports she is able to come to the hospital TOMORROW to meet pt.   CSW will prepare unit for meeting appropriately.   Lum Croft, MSW, [place] 04/13/2024 3:30 PM

## 2024-04-13 NOTE — Plan of Care (Signed)
   Problem: Health Behavior/Discharge Planning: Goal: Ability to manage health-related needs will improve Outcome: Progressing   Problem: Clinical Measurements: Goal: Ability to maintain clinical measurements within normal limits will improve Outcome: Progressing Goal: Will remain free from infection Outcome: Progressing

## 2024-04-13 NOTE — Progress Notes (Signed)
 Patient has anxious affect.  Paranoid, suspicions and isolates to room.  Appears to be responding to internal stimuil.  Refused scheduled medications.  Dr. GORMAN made aware.  Patient declined to eat breakfast or lunch.   Pt ate small amount of dinner in his room. No interaction with peers.  Minimal interaction with peers. Did not attend groups.

## 2024-04-13 NOTE — Plan of Care (Signed)

## 2024-04-13 NOTE — Plan of Care (Signed)
  Problem: Coping: Goal: Level of anxiety will decrease Outcome: Not Progressing   Problem: Activity: Goal: Interest or engagement in activities will improve Outcome: Not Progressing   Problem: Health Behavior/Discharge Planning: Goal: Compliance with treatment plan for underlying cause of condition will improve Outcome: Not Progressing

## 2024-04-13 NOTE — Progress Notes (Signed)
   04/12/24 2200  Psych Admission Type (Psych Patients Only)  Admission Status Involuntary  Psychosocial Assessment  Patient Complaints Suspiciousness;Agitation  Eye Contact Brief  Facial Expression Anxious  Affect Anxious;Preoccupied  Speech Pressured;Loud  Interaction Isolative  Motor Activity Unsteady  Appearance/Hygiene In scrubs  Behavior Characteristics Agitated;Pacing  Mood Preoccupied;Anxious  Thought Process  Coherency Disorganized  Content Paranoia;Religiosity;Preoccupation  Delusions Paranoid;Religious  Perception Derealization  Hallucination Auditory  Judgment Impaired  Confusion Moderate  Danger to Self  Current suicidal ideation? Denies

## 2024-04-13 NOTE — Group Note (Signed)
 Date:  04/13/2024 Time:  10:53 AM  Group Topic/Focus:  Fresh air Therapy with music    Participation Level:  Did Not Attend   [name] 04/13/2024, 10:53 AM

## 2024-04-13 NOTE — Group Note (Signed)
 Recreation Therapy Group Note   Group Topic:Leisure Education  Group Date: 04/13/2024 Start Time: 1500 End Time: 1550 Facilitators: Celestia Jeoffrey BRAVO, LRT, CTRS Location: Courtyard  Group Description: Outdoor Recreation. Patients had the option to play corn hole, ring toss, bowling or listening to music while outside in the courtyard getting fresh air and sunlight. Patients helped water and prune the raised garden beds. LRT and patients discussed things that they enjoy doing in their free time outside of the hospital. LRT encouraged patients to drink water after being active and getting their heart rate up.   Goal Area(s) Addressed: Patient will identify leisure interests.  Patient will practice healthy decision making. Patient will engage in recreation activity.   Affect/Mood: N/A   Participation Level: Did not attend    Clinical Observations/Individualized Feedback: Patient did not attend group.   Plan: Continue to engage patient in RT group sessions 2-3x/week.   Jeoffrey BRAVO Celestia, LRT, CTRS 04/13/2024 5:39 PM

## 2024-04-13 NOTE — Progress Notes (Signed)
 [facility] MD Progress Note  04/13/2024 7:05 PM Arlind Klingerman  MRN:  [id number] Benjamin [name] is a 78 year old male who presents to the inpatient geriatric psych unit after jumping from a two story building. Patient was originally seen at [facility] who referred him to the ED who admitted him here on 03/27/2024. Patient reports that people from the [employer] gang were trying to kill him and the only escape was through the bedroom window on the second floor. He currently lives in the house with [name] and his family and has been living with them for 3-4 years without any problems. On 03/19/2024 he went to the doctor and when he got back home the family wouldn't let him leave. He states that after he realized the gang was going to kill him he barricaded the door with his bed and jumped out the window. When he landed he ran away until one of his neighbors found him and called 911.   Subjective: Per nursing report patient has again become quite paranoid.  Refusing to eat.  Refusing to take the medication.  Patient on interview reports that he felt that somebody has kidnapped him and was quite disorganized.  Patient reports that several gun man came to his house and he does not want to kill them so he jumped out of a two-story building.  Patient does not want to take any psychiatric medication.  Will consider nonemergency force medication for the patient.    Sleep: Fair  Appetite:  Fair  Past Psychiatric History: see h&P  Family History:  Family History  Problem Relation Age of Onset   Colon cancer Neg Hx    Colon polyps Neg Hx    Stomach cancer Neg Hx    Esophageal cancer Neg Hx    Social History:  Social History   Substance and Sexual Activity  Alcohol Use No     Social History   Substance and Sexual Activity  Drug Use No    Social History   Socioeconomic History   Marital status: Single    Spouse name: Not on file   Number of children: Not on file   Years of  education: Not on file   Highest education level: Not on file  Occupational History   Not on file  Tobacco Use   Smoking status: Former    Types: Cigarettes   Smokeless tobacco: Never  Vaping Use   Vaping status: Never Used  Substance and Sexual Activity   Alcohol use: No   Drug use: No   Sexual activity: Not on file  Other Topics Concern   Not on file  Social History Narrative   Not on file   Social Drivers of Health   Financial Resource Strain: Not on file  Food Insecurity: No Food Insecurity (03/27/2024)   Hunger Vital Sign    Worried About Running Out of Food in the Last Year: Never true    Ran Out of Food in the Last Year: Never true  Transportation Needs: No Transportation Needs (03/27/2024)   PRAPARE - Administrator, Civil Service (Medical): No    Lack of Transportation (Non-Medical): No  Physical Activity: Not on file  Stress: Not on file  Social Connections: Moderately Integrated (03/27/2024)   Social Connection and Isolation Panel    Frequency of Communication with Friends and Family: Twice a week    Frequency of Social Gatherings with Friends and Family: Once a week    Attends Religious Services: 1  to 4 times per year    Active Member of Clubs or Organizations: No    Attends Banker Meetings: 1 to 4 times per year    Marital Status: Divorced   Past Medical History:  Past Medical History:  Diagnosis Date   Allergy    Arthritis    back   Prostate cancer (HCC) dx'd 2010   surg only    Past Surgical History:  Procedure Laterality Date   COLONOSCOPY     EYE SURGERY     PROSTATE SURGERY      Current Medications: Current Facility-Administered Medications  Medication Dose Route Frequency Provider Last Rate Last Admin   acetaminophen  (TYLENOL ) tablet 650 mg  650 mg Oral Q6H PRN Coleman, Carolyn H, NP       alum & mag hydroxide-simeth (MAALOX/MYLANTA) 200-200-20 MG/5ML suspension 30 mL  30 mL Oral Q4H PRN Coleman, Carolyn H, NP        docusate sodium  (COLACE) capsule 100 mg  100 mg Oral Daily Jadapalle, Sree, MD   100 mg at 04/12/24 0901   magnesium  hydroxide (MILK OF MAGNESIA) suspension 30 mL  30 mL Oral Daily PRN Coleman, Carolyn H, NP       OLANZapine  (ZYPREXA ) injection 5 mg  5 mg Intramuscular TID PRN Mardy Elveria DEL, NP   5 mg at 04/12/24 2230   OLANZapine  (ZYPREXA ) tablet 2.5 mg  2.5 mg Oral q morning Jadapalle, Sree, MD       OLANZapine  zydis (ZYPREXA ) disintegrating tablet 10 mg  10 mg Oral QHS Jadapalle, Sree, MD       OLANZapine  zydis (ZYPREXA ) disintegrating tablet 5 mg  5 mg Oral TID PRN Mardy Elveria DEL, NP        Lab Results:  No results found for this or any previous visit (from the past 48 hours).   Blood Alcohol level:  Lab Results  Component Value Date   [facility] <15 03/26/2024    Metabolic Disorder Labs: Lab Results  Component Value Date   HGBA1C 5.6 04/02/2024   MPG 114 04/02/2024   No results found for: PROLACTIN Lab Results  Component Value Date   CHOL 146 04/02/2024   TRIG 37 04/02/2024   HDL 43 04/02/2024   CHOLHDL 3.4 04/02/2024   VLDL 7 04/02/2024   LDLCALC 96 04/02/2024    Physical Findings: AIMS:  , ,  ,  ,    CIWA:    COWS:      Psychiatric Specialty Exam:  Presentation  General Appearance:  Appropriate for Environment; Bizarre  Eye Contact: Fair  Speech: Clear and Coherent  Speech Volume: Normal    Mood and Affect  Mood: Anxious  Affect: Congruent   Thought Process  Thought Processes: Disorganized  Descriptions of Associations:Intact  Orientation:Full (Time, Place and Person)  Thought Content: Paranoia at baseline Hallucinations: Denies  Ideas of Reference: Paranoia at baseline Suicidal Thoughts: Denies  Homicidal Thoughts: Denies   Sensorium  Memory: Immediate Fair; Recent Fair; Remote Fair  Judgment: Impaired  Insight: Shallow   Executive Functions  Concentration: Fair  Attention  Span: Fair  Recall: [employer] of Knowledge: Fair  Language: Fair   Psychomotor Activity  Psychomotor Activity: No data recorded  Musculoskeletal: Strength & Muscle Tone: within normal limits Gait & Station: normal Assets  Assets: Manufacturing systems engineer; Resilience    Physical Exam: Physical Exam Vitals and nursing note reviewed.    ROS Blood pressure 102/86, pulse 67, temperature 98.7 F (37.1 C), resp. rate  18, height 6' (1.829 m), weight 100 kg, SpO2 100%. Body mass index is 29.91 kg/m.  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  Patient needs to be monitored closely for ongoing psychosis and paranoid delusions.  Given patient's confusion and chronic paranoia, no family support, APS report has been made and it got screened him for further evaluation by APS.   Differential diagnosis include: unspecified psychosis, delusion disorder, dementia, schizophrenia/schizoaffective disorder   Treatment Plan Summary: Requested the social work team to reach out to APS for safety concerns in the community as patient has chronic paranoia and confusion and is currently homeless   Safety and Monitoring:             -- Voluntary admission to inpatient psychiatric unit for safety, stabilization and treatment             -- Daily contact with patient to assess and evaluate symptoms and progress in treatment             -- Patient's case to be discussed in multi-disciplinary team meeting             -- Observation Level: q15 minute checks             -- Vital signs:  q12 hours             -- Precautions: suicide, elopement, and assault   2. Psychiatric Diagnoses and Treatment:               Zyprexa  5 mg nightly to help with the paranoid delusions.  Will  consider nonemergency forced medication given patient is refusing to take medication and unlikely to improve.  -- The risks/benefits/side-effects/alternatives to this medication were discussed in detail with the patient and time was given for questions. The patient consents to medication trial.                -- Metabolic profile and EKG monitoring obtained while on an atypical antipsychotic (BMI: Lipid Panel: HbgA1c: QTc:)              -- Encouraged patient to participate in unit milieu and in scheduled group therapies                            3. Medical Issues Being Addressed:    No urgent medical needs noted  4. Discharge Planning:   -- Social work and case management to assist with discharge planning and identification of hospital follow-up needs prior to discharge  -- Estimated LOS: 3-4 days  Desmond Chimera, MD 04/13/2024, 7:05 PM

## 2024-04-14 DIAGNOSIS — F22 Delusional disorders: Secondary | ICD-10-CM | POA: Diagnosis not present

## 2024-04-14 NOTE — Group Note (Deleted)
 Date:  04/14/2024 Time:  9:01 PM  Group Topic/Focus:  Coping With Mental Health Crisis:   The purpose of this group is to help patients identify strategies for coping with mental health crisis.  Group discusses possible causes of crisis and ways to manage them effectively. Wrap-Up Group:   The focus of this group is to help patients review their daily goal of treatment and discuss progress on daily workbooks.     Participation Level:  {BHH PARTICIPATION OZCZO:77735}  Participation Quality:  {BHH PARTICIPATION QUALITY:22265}  Affect:  {BHH AFFECT:22266}  Cognitive:  {BHH COGNITIVE:22267}  Insight: {BHH Insight2:20797}  Engagement in Group:  {BHH ENGAGEMENT IN HMNLE:77731}  Modes of Intervention:  {BHH MODES OF INTERVENTION:22269}  Additional Comments:  ***  [name] 04/14/2024, 9:01 PM

## 2024-04-14 NOTE — Group Note (Signed)
 Date:  04/14/2024 Time:  10:59 AM  Group Topic/Focus:  Fresh air Therapy with music    Participation Level:  Did Not Attend    [name] 04/14/2024, 10:59 AM

## 2024-04-14 NOTE — Progress Notes (Signed)
 [facility] MD Progress Note  04/14/2024 12:10 PM Benjamin [name]  MRN:  [id number] Benjamin [name] is a 78 year old male who presents to the inpatient geriatric psych unit after jumping from a two story building. Patient was originally seen at [name] who referred him to the ED who admitted him here on 03/27/2024. Patient reports that people from the [employer] gang were trying to kill him and the only escape was through the bedroom window on the second floor. He currently lives in the house with [name] and his family and has been living with them for 3-4 years without any problems. On 03/19/2024 he went to the doctor and when he got back home the family wouldn't let him leave. He states that after he realized the gang was going to kill him he barricaded the door with his bed and jumped out the window. When he landed he ran away until one of his neighbors found him and called 911.   Subjective: Per staff report patient continues to be very paranoid.  Refusing to eat and refusing to take the medication.  Staff reports that the patient is reporting that staff is trying to kill her.  Patient on interview reports that he wants to get discharged.  When asked why he is not taking his medication patient reports that this writer knows why he is not taking the medication.  Then patient started talking about [facility] and reports that the [facility] are watching and will get him out of the hospital.  Later patient randomly jumped the topic and said to this writer that he did not had sex since 1999.  This Clinical research associate asked him how there is important at this time he could not explain and that this Clinical research associate will not understand.  I think this patient will benefit from nonemergency forced medication at this time.  Will need a second physician to evaluate for nonemergency forced medication.  Patient Sleep: Fair  Appetite:  Fair  Past Psychiatric History: see h&P  Family History:  Family History  Problem  Relation Age of Onset   Colon cancer Neg Hx    Colon polyps Neg Hx    Stomach cancer Neg Hx    Esophageal cancer Neg Hx    Social History:  Social History   Substance and Sexual Activity  Alcohol Use No     Social History   Substance and Sexual Activity  Drug Use No    Social History   Socioeconomic History   Marital status: Single    Spouse name: Not on file   Number of children: Not on file   Years of education: Not on file   Highest education level: Not on file  Occupational History   Not on file  Tobacco Use   Smoking status: Former    Types: Cigarettes   Smokeless tobacco: Never  Vaping Use   Vaping status: Never Used  Substance and Sexual Activity   Alcohol use: No   Drug use: No   Sexual activity: Not on file  Other Topics Concern   Not on file  Social History Narrative   Not on file   Social Drivers of Health   Financial Resource Strain: Not on file  Food Insecurity: No Food Insecurity (03/27/2024)   Hunger Vital Sign    Worried About Running Out of Food in the Last Year: Never true    Ran Out of Food in the Last Year: Never true  Transportation Needs: No Transportation Needs (03/27/2024)  PRAPARE - Administrator, Civil Service (Medical): No    Lack of Transportation (Non-Medical): No  Physical Activity: Not on file  Stress: Not on file  Social Connections: Moderately Integrated (03/27/2024)   Social Connection and Isolation Panel    Frequency of Communication with Friends and Family: Twice a week    Frequency of Social Gatherings with Friends and Family: Once a week    Attends Religious Services: 1 to 4 times per year    Active Member of [employer] or Organizations: No    Attends Engineer, structural: 1 to 4 times per year    Marital Status: Divorced   Past Medical History:  Past Medical History:  Diagnosis Date   Allergy    Arthritis    back   Prostate cancer (HCC) dx'd 2010   surg only    Past Surgical History:  Procedure  Laterality Date   COLONOSCOPY     EYE SURGERY     PROSTATE SURGERY      Current Medications: Current Facility-Administered Medications  Medication Dose Route Frequency Provider Last Rate Last Admin   acetaminophen  (TYLENOL ) tablet 650 mg  650 mg Oral Q6H PRN Coleman, Carolyn H, NP       alum & mag hydroxide-simeth (MAALOX/MYLANTA) 200-200-20 MG/5ML suspension 30 mL  30 mL Oral Q4H PRN Coleman, Carolyn H, NP       docusate sodium  (COLACE) capsule 100 mg  100 mg Oral Daily Jadapalle, Sree, MD   100 mg at 04/12/24 0901   magnesium  hydroxide (MILK OF MAGNESIA) suspension 30 mL  30 mL Oral Daily PRN Coleman, Carolyn H, NP       OLANZapine  (ZYPREXA ) injection 5 mg  5 mg Intramuscular TID PRN Mardy Elveria DEL, NP   5 mg at 04/12/24 2230   OLANZapine  (ZYPREXA ) tablet 2.5 mg  2.5 mg Oral q morning Jadapalle, Sree, MD       OLANZapine  zydis (ZYPREXA ) disintegrating tablet 10 mg  10 mg Oral QHS Jadapalle, Sree, MD   10 mg at 04/13/24 2144   OLANZapine  zydis (ZYPREXA ) disintegrating tablet 5 mg  5 mg Oral TID PRN Mardy Elveria DEL, NP        Lab Results:  No results found for this or any previous visit (from the past 48 hours).   Blood Alcohol level:  Lab Results  Component Value Date   [facility] <15 03/26/2024    Metabolic Disorder Labs: Lab Results  Component Value Date   HGBA1C 5.6 04/02/2024   MPG 114 04/02/2024   No results found for: PROLACTIN Lab Results  Component Value Date   CHOL 146 04/02/2024   TRIG 37 04/02/2024   HDL 43 04/02/2024   CHOLHDL 3.4 04/02/2024   VLDL 7 04/02/2024   LDLCALC 96 04/02/2024    Physical Findings: AIMS:  , ,  ,  ,    CIWA:    COWS:      Psychiatric Specialty Exam:  Presentation  General Appearance:  Appropriate for Environment; Bizarre  Eye Contact: Fair  Speech: Clear and Coherent  Speech Volume: Normal    Mood and Affect  Mood: Anxious  Affect: Congruent   Thought Process  Thought  Processes: Disorganized  Descriptions of Associations:Intact  Orientation:Full (Time, Place and Person)  Thought Content: Paranoia at baseline Hallucinations: Denies  Ideas of Reference: Paranoia at baseline Suicidal Thoughts: Denies  Homicidal Thoughts: Denies   Sensorium  Memory: Immediate Fair; Recent Fair; Remote Fair  Judgment: Impaired  Insight:  Shallow   Executive Functions  Concentration: Fair  Attention Span: Fair  Recall: [employer] of Knowledge: Fair  Language: Fair   Psychomotor Activity  Psychomotor Activity: No data recorded  Musculoskeletal: Strength & Muscle Tone: within normal limits Gait & Station: normal Assets  Assets: Manufacturing systems engineer; Resilience    Physical Exam: Physical Exam Vitals and nursing note reviewed.    ROS Blood pressure 124/78, pulse 66, temperature 98.7 F (37.1 C), resp. rate 19, height 6' (1.829 m), weight 100 kg, SpO2 100%. Body mass index is 29.91 kg/m.  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  Patient needs to be monitored closely for ongoing psychosis and paranoid delusions.  Given patient's confusion and chronic paranoia, no family support, APS report has been made and it got screened him for further evaluation by APS.   Differential diagnosis include: unspecified psychosis, delusion disorder, dementia, schizophrenia/schizoaffective disorder   Treatment Plan Summary: Requested the social work team to reach out to APS for safety concerns in the community as patient has chronic paranoia and confusion and is currently homeless   Safety and Monitoring:             -- Voluntary admission to inpatient psychiatric unit for safety, stabilization and treatment             --  Daily contact with patient to assess and evaluate symptoms and progress in treatment             -- Patient's case to be discussed in multi-disciplinary team meeting             -- Observation Level: q15 minute checks             -- Vital signs:  q12 hours             -- Precautions: suicide, elopement, and assault   2. Psychiatric Diagnoses and Treatment:               Zyprexa  5 mg nightly to help with the paranoid delusions.    I think patient will benefit from nonemergency forced medication at this time.  Will ask another psychiatrist to come and evaluate the patient so that we can start patient on nonemergency forced medication.  -- The risks/benefits/side-effects/alternatives to this medication were discussed in detail with the patient and time was given for questions. The patient consents to medication trial.                -- Metabolic profile and EKG monitoring obtained while on an atypical antipsychotic (BMI: Lipid Panel: HbgA1c: QTc:)              -- Encouraged patient to participate in unit milieu and in scheduled group therapies                            3. Medical Issues Being Addressed:    No urgent medical needs noted  4. Discharge Planning:   -- Social work and case management to assist with discharge planning and identification of hospital follow-up needs prior to discharge  -- Estimated LOS: 3-4 days  Desmond Chimera, MD 04/14/2024, 12:10 PM

## 2024-04-14 NOTE — Progress Notes (Signed)
   04/14/24 1500  Psychosocial Assessment  Patient Complaints Irritability;Suspiciousness  Eye Contact Poor  Facial Expression Anxious  Affect Preoccupied;Irritable  Speech Pressured  Interaction Minimal  Motor Activity Slow  Appearance/Hygiene In scrubs  Behavior Characteristics Anxious;Irritable;Pacing  Mood Irritable;Suspicious  Thought Process  Coherency Disorganized  Content Religiosity;Preoccupation  Delusions Paranoid;Persecutory;Religious  Perception Hallucinations  Hallucination Auditory  Judgment Poor  Confusion Moderate  Danger to Self  Current suicidal ideation? Denies  Agreement Not to Harm Self Yes  Danger to Others  Danger to Others None reported or observed

## 2024-04-14 NOTE — Plan of Care (Signed)
  Problem: Health Behavior/Discharge Planning: Goal: Ability to manage health-related needs will improve Outcome: Not Progressing   Problem: Clinical Measurements: Goal: Ability to maintain clinical measurements within normal limits will improve Outcome: Not Progressing   Problem: Education: Goal: Knowledge of General Education information will improve Description: Including pain rating scale, medication(s)/side effects and non-pharmacologic comfort measures Outcome: Not Progressing

## 2024-04-14 NOTE — Progress Notes (Signed)
   04/14/24 0043  Psych Admission Type (Psych Patients Only)  Admission Status Involuntary  Psychosocial Assessment  Patient Complaints Irritability;Suspiciousness  Eye Contact Poor  Facial Expression Anxious  Affect Anxious;Irritable;Preoccupied  Speech Pressured;Incoherent  Interaction Minimal  Motor Activity Unsteady  Appearance/Hygiene In scrubs  Behavior Characteristics Anxious;Irritable  Mood Suspicious;Preoccupied;Irritable  Thought Process  Coherency Disorganized  Content Religiosity;Preoccupation  Delusions Paranoid;Religious  Perception Hallucinations  Hallucination Auditory  Judgment Poor  Confusion Moderate

## 2024-04-15 DIAGNOSIS — F22 Delusional disorders: Secondary | ICD-10-CM | POA: Diagnosis not present

## 2024-04-15 MED ORDER — FLUPHENAZINE HCL 2.5 MG/ML IJ SOLN
5.0000 mg | Freq: Two times a day (BID) | INTRAMUSCULAR | Status: DC
  Filled 2024-04-15 (×16): qty 2

## 2024-04-15 MED ORDER — FLUPHENAZINE HCL 5 MG PO TABS
5.0000 mg | ORAL_TABLET | Freq: Two times a day (BID) | ORAL | Status: DC
  Administered 2024-04-15 – 2024-04-22 (×15): 5 mg via ORAL
  Filled 2024-04-15 (×16): qty 1

## 2024-04-15 MED ORDER — FLUPHENAZINE HCL 5 MG PO TABS: 5.0000 mg | ORAL_TABLET | Freq: Two times a day (BID) | ORAL | Status: DC

## 2024-04-15 MED ORDER — FLUPHENAZINE HCL 5 MG/ML PO CONC: 5.0000 mg | Freq: Two times a day (BID) | ORAL | Status: DC

## 2024-04-15 NOTE — Progress Notes (Signed)
   04/15/24 1400  Psych Admission Type (Psych Patients Only)  Admission Status Involuntary  Psychosocial Assessment  Patient Complaints Irritability  Eye Contact Poor  Facial Expression Anxious;Worried  Affect Preoccupied  Dance movement psychotherapist Activity Restless;Slow  Appearance/Hygiene In scrubs  Behavior Characteristics Anxious  Mood Suspicious  Thought Process  Coherency Disorganized  Content Delusions  Delusions Paranoid;Persecutory  Perception Hallucinations  Hallucination Auditory  Judgment Poor  Confusion Moderate  Danger to Self  Current suicidal ideation? Denies  Danger to Others  Danger to Others None reported or observed

## 2024-04-15 NOTE — Group Note (Signed)
 Date:  04/15/2024 Time:  11:17 AM  Group Topic/Focus:  Outside Rec/Music Therapy  The purpose of this group is for patients to go out and get fresh air while interacting with other peers. Also enjoying soothing music of their liking,    Participation Level:  Did Not Attend  Beatris ONEIDA Hasten 04/15/2024, 11:17 AM

## 2024-04-15 NOTE — Group Note (Signed)
 Date:  04/15/2024 Time:  10:02 PM  Group Topic/Focus:  Wrap-Up Group:   The focus of this group is to help patients review their daily goal of treatment and discuss progress on daily workbooks.    Participation Level:  Active  Participation Quality:  Appropriate  Affect:  Appropriate  Cognitive:  Alert  Insight: Appropriate  Engagement in Group:  Engaged  Modes of Intervention:  Discussion  Additional Comments:    [name] 04/15/2024, 10:02 PM

## 2024-04-15 NOTE — Group Note (Unsigned)
 Date:  04/15/2024 Time:  8:32 PM  Group Topic/Focus:  Overcoming Stress:   The focus of this group is to define stress and help patients assess their triggers. Wrap-Up Group:   The focus of this group is to help patients review their daily goal of treatment and discuss progress on daily workbooks.     Participation Level:  {BHH PARTICIPATION OZCZO:77735}  Participation Quality:  {BHH PARTICIPATION QUALITY:22265}  Affect:  {BHH AFFECT:22266}  Cognitive:  {BHH COGNITIVE:22267}  Insight: {BHH Insight2:20797}  Engagement in Group:  {BHH ENGAGEMENT IN HMNLE:77731}  Modes of Intervention:  {BHH MODES OF INTERVENTION:22269}  Additional Comments:  ***  Benjamin [name] [name] 04/15/2024, 8:32 PM

## 2024-04-15 NOTE — Progress Notes (Signed)
 [name] MD Progress Note  04/15/2024 12:14 PM Benjamin [name]  MRN:  [id number] Willaim [name] is a 78 year old male who presents to the inpatient geriatric psych unit after jumping from a two story building. Patient was originally seen at [facility] who referred him to the ED who admitted him here on 03/27/2024. Patient reports that people from the [place] gang were trying to kill him and the only escape was through the bedroom window on the second floor. He currently lives in the house with [name] and his family and has been living with them for 3-4 years without any problems. On 03/19/2024 he went to the doctor and when he got back home the family wouldn't let him leave. He states that after he realized the gang was going to kill him he barricaded the door with his bed and jumped out the window. When he landed he ran away until one of his neighbors found him and called 911.   Subjective: Nursing reports that the patient continues to be paranoid, very suspicious, not eating.  Not coming out.  Reviewed refusing to take the medication.  Patient did not want to talk to this Clinical research associate when asked this Clinical research associate to leave.  Talk to my colleague Dr. Ruther who has evaluated the patient and he agreed for nonemergency forced medication.  Will start patient on Prolixin  5 milligram p.o./IM if patient refuses p.o.  Sleep: Fair  Appetite:  Fair  Past Psychiatric History: see h&P  Family History:  Family History  Problem Relation Age of Onset   Colon cancer Neg Hx    Colon polyps Neg Hx    Stomach cancer Neg Hx    Esophageal cancer Neg Hx    Social History:  Social History   Substance and Sexual Activity  Alcohol Use No     Social History   Substance and Sexual Activity  Drug Use No    Social History   Socioeconomic History   Marital status: Single    Spouse name: Not on file   Number of children: Not on file   Years of education: Not on file   Highest education level: Not on file   Occupational History   Not on file  Tobacco Use   Smoking status: Former    Types: Cigarettes   Smokeless tobacco: Never  Vaping Use   Vaping status: Never Used  Substance and Sexual Activity   Alcohol use: No   Drug use: No   Sexual activity: Not on file  Other Topics Concern   Not on file  Social History Narrative   Not on file   Social Drivers of Health   Financial Resource Strain: Not on file  Food Insecurity: No Food Insecurity (03/27/2024)   Hunger Vital Sign    Worried About Running Out of Food in the Last Year: Never true    Ran Out of Food in the Last Year: Never true  Transportation Needs: No Transportation Needs (03/27/2024)   PRAPARE - Administrator, Civil Service (Medical): No    Lack of Transportation (Non-Medical): No  Physical Activity: Not on file  Stress: Not on file  Social Connections: Moderately Integrated (03/27/2024)   Social Connection and Isolation Panel    Frequency of Communication with Friends and Family: Twice a week    Frequency of Social Gatherings with Friends and Family: Once a week    Attends Religious Services: 1 to 4 times per year    Active Member of [employer]  or Organizations: No    Attends Banker Meetings: 1 to 4 times per year    Marital Status: Divorced   Past Medical History:  Past Medical History:  Diagnosis Date   Allergy    Arthritis    back   Prostate cancer (HCC) dx'd 2010   surg only    Past Surgical History:  Procedure Laterality Date   COLONOSCOPY     EYE SURGERY     PROSTATE SURGERY      Current Medications: Current Facility-Administered Medications  Medication Dose Route Frequency Provider Last Rate Last Admin   acetaminophen  (TYLENOL ) tablet 650 mg  650 mg Oral Q6H PRN Coleman, Carolyn H, NP       alum & mag hydroxide-simeth (MAALOX/MYLANTA) 200-200-20 MG/5ML suspension 30 mL  30 mL Oral Q4H PRN Coleman, Carolyn H, NP       docusate sodium  (COLACE) capsule 100 mg  100 mg Oral Daily  Jadapalle, Sree, MD   100 mg at 04/12/24 0901   magnesium  hydroxide (MILK OF MAGNESIA) suspension 30 mL  30 mL Oral Daily PRN Coleman, Carolyn H, NP       OLANZapine  (ZYPREXA ) injection 5 mg  5 mg Intramuscular TID PRN Mardy Elveria DEL, NP   5 mg at 04/12/24 2230   OLANZapine  (ZYPREXA ) tablet 2.5 mg  2.5 mg Oral q morning Jadapalle, Sree, MD       OLANZapine  zydis (ZYPREXA ) disintegrating tablet 10 mg  10 mg Oral QHS Jadapalle, Sree, MD   10 mg at 04/14/24 2116   OLANZapine  zydis (ZYPREXA ) disintegrating tablet 5 mg  5 mg Oral TID PRN Mardy Elveria DEL, NP        Lab Results:  No results found for this or any previous visit (from the past 48 hours).   Blood Alcohol level:  Lab Results  Component Value Date   [name] <15 03/26/2024    Metabolic Disorder Labs: Lab Results  Component Value Date   HGBA1C 5.6 04/02/2024   MPG 114 04/02/2024   No results found for: PROLACTIN Lab Results  Component Value Date   CHOL 146 04/02/2024   TRIG 37 04/02/2024   HDL 43 04/02/2024   CHOLHDL 3.4 04/02/2024   VLDL 7 04/02/2024   LDLCALC 96 04/02/2024    Physical Findings: AIMS:  , ,  ,  ,    CIWA:    COWS:      Psychiatric Specialty Exam:  Presentation  General Appearance:  Appropriate for Environment; Bizarre  Eye Contact: Fair  Speech: Clear and Coherent  Speech Volume: Normal    Mood and Affect  Mood: Anxious  Affect: Congruent   Thought Process  Thought Processes: Disorganized  Descriptions of Associations:Intact  Orientation:Full (Time, Place and Person)  Thought Content: Paranoia at baseline Hallucinations: Denies  Ideas of Reference: Paranoia at baseline Suicidal Thoughts: Denies  Homicidal Thoughts: Denies   Sensorium  Memory: Immediate Fair; Recent Fair; Remote Fair  Judgment: Impaired  Insight: Shallow   Executive Functions  Concentration: Fair  Attention Span: Fair  Recall: [employer] of  Knowledge: Fair  Language: Fair   Psychomotor Activity  Psychomotor Activity: No data recorded  Musculoskeletal: Strength & Muscle Tone: within normal limits Gait & Station: normal Assets  Assets: Manufacturing systems engineer; Resilience    Physical Exam: Physical Exam Vitals and nursing note reviewed.    ROS Blood pressure 114/61, pulse 65, temperature 98.4 F (36.9 C), resp. rate 18, height 6' (1.829 m), weight 100 kg, SpO2  100%. Body mass index is 29.91 kg/m.  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  Patient needs to be monitored closely for ongoing psychosis and paranoid delusions.  Given patient's confusion and chronic paranoia, no family support, APS report has been made and it got screened him for further evaluation by APS.   Differential diagnosis include: unspecified psychosis, delusion disorder, dementia, schizophrenia/schizoaffective disorder   Treatment Plan Summary: Requested the social work team to reach out to APS for safety concerns in the community as patient has chronic paranoia and confusion and is currently homeless   Safety and Monitoring:             -- Voluntary admission to inpatient psychiatric unit for safety, stabilization and treatment             -- Daily contact with patient to assess and evaluate symptoms and progress in treatment             -- Patient's case to be discussed in multi-disciplinary team meeting             -- Observation Level: q15 minute checks             -- Vital signs:  q12 hours             -- Precautions: suicide, elopement, and assault   2. Psychiatric Diagnoses and Treatment:               Started patient on Prolixin  5 mg p.o. twice daily and IM if he refuses p.o. medication.  I think patient  will benefit from nonemergency forced medication at this time.  Will ask another psychiatrist to come and evaluate the patient so that we can start patient on nonemergency forced medication.  -- The risks/benefits/side-effects/alternatives to this medication were discussed in detail with the patient and time was given for questions. The patient consents to medication trial.                -- Metabolic profile and EKG monitoring obtained while on an atypical antipsychotic (BMI: Lipid Panel: HbgA1c: QTc:)              -- Encouraged patient to participate in unit milieu and in scheduled group therapies                            3. Medical Issues Being Addressed:    No urgent medical needs noted  4. Discharge Planning:   -- Social work and case management to assist with discharge planning and identification of hospital follow-up needs prior to discharge  -- Estimated LOS: 3-4 days  Desmond Chimera, MD 04/15/2024, 12:14 PM

## 2024-04-15 NOTE — Plan of Care (Signed)
 Patient requires a lot of encouragement to take medication he is confused continues to be very suspicious and paranoid holding his walker when he sees anyone walk by his room. Stays most of the shift in his room with lights out requesting to close the windows suspects there where people looking in his room from the window. Support encouraged.   Problem: Clinical Measurements: Goal: Ability to maintain clinical measurements within normal limits will improve Outcome: Progressing Goal: Will remain free from infection Outcome: Progressing Goal: Diagnostic test results will improve Outcome: Progressing

## 2024-04-16 DIAGNOSIS — F22 Delusional disorders: Secondary | ICD-10-CM | POA: Diagnosis not present

## 2024-04-16 NOTE — Group Note (Signed)
 Date:  04/16/2024 Time:  5:29 PM  Group Topic/Focus:  Healthy Communication:   The focus of this group is to discuss communication, barriers to communication, as well as healthy ways to communicate with others.    Participation Level:  Did not attend  Participation Quality:    Affect:    Cognitive:    Insight:   Engagement in Group:    Modes of Intervention:    Additional Comments:    Zayne Draheim 04/16/2024, 5:29 PM

## 2024-04-16 NOTE — Plan of Care (Deleted)
   Problem: Education: Goal: Emotional status will improve Outcome: Progressing Goal: Mental status will improve Outcome: Not Progressing

## 2024-04-16 NOTE — Group Note (Signed)
 Recreation Therapy Group Note   Group Topic:Coping Skills  Group Date: 04/16/2024 Start Time: 1410 End Time: 1500 Facilitators: Celestia Jeoffrey BRAVO, LRT, CTRS Location: Dayroom   Group Description: Mind Map.  Patient was provided a blank template of a diagram with 32 blank boxes in a tiered system, branching from the center (similar to a bubble chart). LRT directed patients to label the middle of the diagram Coping Skills. LRT and patients then came up with 8 different coping skills as examples. Pt were directed to record their coping skills in the 2nd tier boxes closest to the center.  Patients would then share their coping skills with the group as LRT wrote them out. LRT gave a handout of 99 different coping skills at the end of group.   Goal Area(s) Addressed: Patients will be able to define "coping skills". Patient will identify new coping skills.  Patient will increase communication.   Affect/Mood: N/A   Participation Level: Did not attend    Clinical Observations/Individualized Feedback: Patient did not attend group.   Plan: Continue to engage patient in RT group sessions 2-3x/week.   Jeoffrey BRAVO Celestia, LRT, CTRS 04/16/2024 5:37 PM

## 2024-04-16 NOTE — Plan of Care (Signed)
   Problem: Education: Goal: Emotional status will improve Outcome: Progressing Goal: Mental status will improve Outcome: Not Progressing

## 2024-04-16 NOTE — Group Note (Unsigned)
 Date:  04/17/2024 Time:  12:20 AM  Group Topic/Focus:  Wrap-Up Group:   The focus of this group is to help patients review their daily goal of treatment and discuss progress on daily workbooks.    Participation Level:  Minimal  Participation Quality:  Attentive  Affect:  Appropriate  Cognitive:  Oriented  Insight: Limited  Engagement in Group:  Limited  Modes of Intervention:  Discussion  Additional Comments:    Benjamin [name] [name] 04/17/2024, 12:20 AM

## 2024-04-16 NOTE — Progress Notes (Signed)
 [facility] MD Progress Note  04/16/2024 11:37 PM Benjamin [name]  MRN:  [id number] Benjamin [name] is a 78 year old male who presents to the inpatient geriatric psych unit after jumping from a two story building. Patient was originally seen at [facility] who referred him to the ED who admitted him here on 03/27/2024. Patient reports that people from the [place] gang were trying to kill him and the only escape was through the bedroom window on the second floor. He currently lives in the house with [name] and his family and has been living with them for 3-4 years without any problems. On 03/19/2024 he went to the doctor and when he got back home the family wouldn't let him leave. He states that after he realized the gang was going to kill him he barricaded the door with his bed and jumped out the window. When he landed he ran away until one of his neighbors found him and called 911.   Subjective: Chart reviewed and discussed with the treatment team.  Patient is noted to be sleeping in his bed.  He had hard time staying awake and is noted to be little confused.  He is not endorsing SI/HI/plan and is not endorsing auditory/visual hallucinations.  Per nursing patient is coming out of his room around lunchtime.  Patient is taking his medications as of today.  Discussed with the treatment team and social work team  Sleep: Fair  Appetite:  Fair  Past Psychiatric History: see h&P  Family History:  Family History  Problem Relation Age of Onset   Colon cancer Neg Hx    Colon polyps Neg Hx    Stomach cancer Neg Hx    Esophageal cancer Neg Hx    Social History:  Social History   Substance and Sexual Activity  Alcohol Use No     Social History   Substance and Sexual Activity  Drug Use No    Social History   Socioeconomic History   Marital status: Single    Spouse name: Not on file   Number of children: Not on file   Years of education: Not on file   Highest education level: Not on  file  Occupational History   Not on file  Tobacco Use   Smoking status: Former    Types: Cigarettes   Smokeless tobacco: Never  Vaping Use   Vaping status: Never Used  Substance and Sexual Activity   Alcohol use: No   Drug use: No   Sexual activity: Not on file  Other Topics Concern   Not on file  Social History Narrative   Not on file   Social Drivers of Health   Financial Resource Strain: Not on file  Food Insecurity: No Food Insecurity (03/27/2024)   Hunger Vital Sign    Worried About Running Out of Food in the Last Year: Never true    Ran Out of Food in the Last Year: Never true  Transportation Needs: No Transportation Needs (03/27/2024)   PRAPARE - Administrator, Civil Service (Medical): No    Lack of Transportation (Non-Medical): No  Physical Activity: Not on file  Stress: Not on file  Social Connections: Moderately Integrated (03/27/2024)   Social Connection and Isolation Panel    Frequency of Communication with Friends and Family: Twice a week    Frequency of Social Gatherings with Friends and Family: Once a week    Attends Religious Services: 1 to 4 times per year    Active  Member of Clubs or Organizations: No    Attends Engineer, structural: 1 to 4 times per year    Marital Status: Divorced   Past Medical History:  Past Medical History:  Diagnosis Date   Allergy    Arthritis    back   Prostate cancer (HCC) dx'd 2010   surg only    Past Surgical History:  Procedure Laterality Date   COLONOSCOPY     EYE SURGERY     PROSTATE SURGERY      Current Medications: Current Facility-Administered Medications  Medication Dose Route Frequency Provider Last Rate Last Admin   acetaminophen  (TYLENOL ) tablet 650 mg  650 mg Oral Q6H PRN Coleman, Carolyn H, NP       alum & mag hydroxide-simeth (MAALOX/MYLANTA) 200-200-20 MG/5ML suspension 30 mL  30 mL Oral Q4H PRN Coleman, Carolyn H, NP       docusate sodium  (COLACE) capsule 100 mg  100 mg Oral Daily  Cameka Rae, MD   100 mg at 04/16/24 9096   fluPHENAZine  (PROLIXIN ) tablet 5 mg  5 mg Oral BID Hansika Leaming, MD   5 mg at 04/16/24 2120   Or   fluPHENAZine  (PROLIXIN ) injection 5 mg  5 mg Intramuscular BID Karmello Abercrombie, MD       magnesium  hydroxide (MILK OF MAGNESIA) suspension 30 mL  30 mL Oral Daily PRN Mardy Elveria DEL, NP       OLANZapine  (ZYPREXA ) injection 5 mg  5 mg Intramuscular TID PRN Coleman, Carolyn H, NP   5 mg at 04/12/24 2230   OLANZapine  (ZYPREXA ) tablet 2.5 mg  2.5 mg Oral q morning Aliese Brannum, MD   2.5 mg at 04/16/24 9095   OLANZapine  zydis (ZYPREXA ) disintegrating tablet 10 mg  10 mg Oral QHS Mckaylah Bettendorf, MD   10 mg at 04/16/24 2119   OLANZapine  zydis (ZYPREXA ) disintegrating tablet 5 mg  5 mg Oral TID PRN Mardy Elveria DEL, NP        Lab Results:  No results found for this or any previous visit (from the past 48 hours).   Blood Alcohol level:  Lab Results  Component Value Date   [facility] <15 03/26/2024    Metabolic Disorder Labs: Lab Results  Component Value Date   HGBA1C 5.6 04/02/2024   MPG 114 04/02/2024   No results found for: PROLACTIN Lab Results  Component Value Date   CHOL 146 04/02/2024   TRIG 37 04/02/2024   HDL 43 04/02/2024   CHOLHDL 3.4 04/02/2024   VLDL 7 04/02/2024   LDLCALC 96 04/02/2024    Physical Findings: AIMS:  , ,  ,  ,    CIWA:    COWS:      Psychiatric Specialty Exam:  Presentation  General Appearance:  Appropriate for Environment; Bizarre  Eye Contact: Fair  Speech: Clear and Coherent  Speech Volume: Normal    Mood and Affect  Mood: Anxious  Affect: Congruent   Thought Process  Thought Processes: Disorganized  Descriptions of Associations:Intact  Orientation:Full (Time, Place and Person)  Thought Content: Paranoia at baseline Hallucinations: Denies  Ideas of Reference: Paranoia at baseline Suicidal Thoughts: Denies  Homicidal Thoughts: Denies   Sensorium   Memory: Immediate Fair; Recent Fair; Remote Fair  Judgment: Impaired  Insight: Shallow   Executive Functions  Concentration: Fair  Attention Span: Fair  Recall: [employer] of Knowledge: Fair  Language: Fair   Psychomotor Activity  Psychomotor Activity: No data recorded  Musculoskeletal: Strength & Muscle Tone:  within normal limits Gait & Station: normal Assets  Assets: Manufacturing systems engineer; Resilience    Physical Exam: Physical Exam Vitals and nursing note reviewed.    ROS Blood pressure 108/83, pulse 85, temperature 98.2 F (36.8 C), resp. rate 14, height 6' (1.829 m), weight 100 kg, SpO2 97%. Body mass index is 29.91 kg/m.  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  Patient needs to be monitored closely for ongoing psychosis and paranoid delusions.  Given patient's confusion and chronic paranoia, no family support, APS report has been made and it got screened him for further evaluation by APS.   Differential diagnosis include: unspecified psychosis, delusion disorder, dementia, schizophrenia/schizoaffective disorder   Treatment Plan Summary: Requested the social work team to reach out to APS for safety concerns in the community as patient has chronic paranoia and confusion and is currently homeless   Safety and Monitoring:             -- Voluntary admission to inpatient psychiatric unit for safety, stabilization and treatment             -- Daily contact with patient to assess and evaluate symptoms and progress in treatment             -- Patient's case to be discussed in multi-disciplinary team meeting             -- Observation Level: q15 minute checks             -- Vital signs:  q12 hours             -- Precautions:  suicide, elopement, and assault   2. Psychiatric Diagnoses and Treatment:               Started patient on Prolixin  5 mg p.o. twice daily and IM if he refuses p.o. medication.  I think patient will benefit from nonemergency forced medication at this time.  Will ask another psychiatrist to come and evaluate the patient so that we can start patient on nonemergency forced medication.  -- The risks/benefits/side-effects/alternatives to this medication were discussed in detail with the patient and time was given for questions. The patient consents to medication trial.                -- Metabolic profile and EKG monitoring obtained while on an atypical antipsychotic (BMI: Lipid Panel: HbgA1c: QTc:)              -- Encouraged patient to participate in unit milieu and in scheduled group therapies                            3. Medical Issues Being Addressed:    No urgent medical needs noted  4. Discharge Planning:   -- Social work and case management to assist with discharge planning and identification of hospital follow-up needs prior to discharge  -- Estimated LOS: 3-4 days  Allyn Foil, MD 04/16/2024, 11:37 PM

## 2024-04-16 NOTE — Progress Notes (Signed)
   04/15/24 2230  Psych Admission Type (Psych Patients Only)  Admission Status Involuntary  Psychosocial Assessment  Patient Complaints Irritability  Eye Contact Poor  Facial Expression Anxious;Worried  Affect Preoccupied  Dance movement psychotherapist Activity Restless;Slow  Appearance/Hygiene In scrubs  Behavior Characteristics Cooperative;Anxious  Mood Suspicious  Thought Process  Coherency Disorganized  Content Delusions  Delusions Paranoid  Perception Hallucinations  Hallucination Auditory  Judgment Poor  Confusion Moderate  Danger to Self  Current suicidal ideation? Denies  Danger to Others  Danger to Others None reported or observed

## 2024-04-17 DIAGNOSIS — F22 Delusional disorders: Secondary | ICD-10-CM | POA: Diagnosis not present

## 2024-04-17 MED ORDER — OLANZAPINE 5 MG PO TBDP
7.5000 mg | ORAL_TABLET | Freq: Every day | ORAL | Status: DC
  Administered 2024-04-18 – 2024-04-19 (×2): 7.5 mg via ORAL
  Filled 2024-04-17 (×2): qty 2

## 2024-04-17 NOTE — Progress Notes (Signed)
   04/16/24 2000  Psych Admission Type (Psych Patients Only)  Admission Status Involuntary  Psychosocial Assessment  Patient Complaints Worrying  Eye Contact Brief  Facial Expression Anxious;Worried  Affect Preoccupied  Dance movement psychotherapist Activity Restless;Slow  Appearance/Hygiene In scrubs  Behavior Characteristics Cooperative  Mood Suspicious  Thought Process  Coherency Disorganized  Content Delusions  Delusions Paranoid;Persecutory  Perception Hallucinations  Hallucination Auditory  Judgment Poor  Confusion Moderate  Danger to Self  Current suicidal ideation? Denies  Agreement Not to Harm Self Yes  Description of Agreement Verbal  Danger to Others  Danger to Others None reported or observed

## 2024-04-17 NOTE — Plan of Care (Signed)
  Problem: Education: Goal: Emotional status will improve Outcome: Progressing Goal: Verbalization of understanding the information provided will improve Outcome: Not Progressing

## 2024-04-17 NOTE — Progress Notes (Signed)
   04/17/24 1400  Psych Admission Type (Psych Patients Only)  Admission Status Involuntary  Psychosocial Assessment  Patient Complaints Worrying  Eye Contact Brief  Facial Expression Anxious  Affect Preoccupied  Speech Soft  Interaction Isolative  Motor Activity Restless  Appearance/Hygiene In scrubs  Behavior Characteristics Cooperative  Mood Suspicious  Thought Process  Coherency Disorganized  Content Delusions  Delusions Paranoid  Perception Hallucinations  Hallucination Auditory  Judgment Poor  Confusion Moderate  Danger to Self  Current suicidal ideation? Denies  Agreement Not to Harm Self Yes  Description of Agreement verbal  Danger to Others  Danger to Others None reported or observed

## 2024-04-17 NOTE — Group Note (Unsigned)
 Date:  04/17/2024 Time:  8:23 PM  Group Topic/Focus:  Emotional Education:   The focus of this group is to discuss what feelings/emotions are, and how they are experienced.     Participation Level:  {[facility] PARTICIPATION OZCZO:77735}  Participation Quality:  {[facility] PARTICIPATION QUALITY:22265}  Affect:  {[facility] AFFECT:22266}  Cognitive:  {[facility] COGNITIVE:22267}  Insight: {[facility] Insight2:20797}  Engagement in Group:  {[facility] ENGAGEMENT IN HMNLE:77731}  Modes of Intervention:  {[facility] MODES OF INTERVENTION:22269}  Additional Comments:  ***  Jacqualyn FORBES Penton 04/17/2024, 8:23 PM

## 2024-04-17 NOTE — Group Note (Signed)
 Recreation Therapy Group Note   Group Topic:Stress Management  Group Date: 04/17/2024 Start Time: 1400 End Time: 1500 Facilitators: Celestia Jeoffrey BRAVO, LRT, CTRS Location: Courtyard  Group Description: Stress Jenga. LRT and pts played games of Jenga. LRT prompted group discussion on the physical signs and symptoms of stress, similar to the feeling when you're playing Jenga and trying to remove a wooden block from the stack without it all collapsing. LRT and pt discussed the physical and mental signs of stress, as well as coping skills to manage them.   Goal Area(s) Addressed: Patient will identify physical symptoms of stress. Patient will identify coping skills for stress. Patient will build frustration tolerance skills.  Patient will increase communication.    Affect/Mood: N/A   Participation Level: Did not attend    Clinical Observations/Individualized Feedback: Patient did not attend group.   Plan: Continue to engage patient in RT group sessions 2-3x/week.   Jeoffrey BRAVO Celestia, LRT, CTRS 04/17/2024 5:24 PM

## 2024-04-17 NOTE — Group Note (Signed)
 Date:  04/17/2024 Time:  11:52 PM  Group Topic/Focus:  Wrap-Up Group:   The focus of this group is to help patients review their daily goal of treatment and discuss progress on daily workbooks.    Participation Level:  Did Not Attend  Participation Quality:     Affect:     Cognitive:     Insight: None  Engagement in Group:  None  Modes of Intervention:     Additional Comments:    [name] 04/17/2024, 11:52 PM

## 2024-04-17 NOTE — Group Note (Signed)
 [facility] LCSW Group Therapy Note   Group Date: 04/17/2024 Start Time: 1315 End Time: 1400   Type of Therapy/Topic:  Group Therapy:  Emotion Regulation  Participation Level:  Did Not Attend   Mood:  Description of Group:    The purpose of this group is to assist patients in learning to regulate negative emotions and experience positive emotions. Patients will be guided to discuss ways in which they have been vulnerable to their negative emotions. These vulnerabilities will be juxtaposed with experiences of positive emotions or situations, and patients challenged to use positive emotions to combat negative ones. Special emphasis will be placed on coping with negative emotions in conflict situations, and patients will process healthy conflict resolution skills.  Therapeutic Goals: Patient will identify two positive emotions or experiences to reflect on in order to balance out negative emotions:  Patient will label two or more emotions that they find the most difficult to experience:  Patient will be able to demonstrate positive conflict resolution skills through discussion or role plays:   Summary of Patient Progress:   X    Therapeutic Modalities:   Cognitive Behavioral Therapy Feelings Identification Dialectical Behavioral Therapy   [name], LCSWA
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
## 2024-04-17 NOTE — Group Note (Signed)
 Date:  04/17/2024 Time:  11:22 AM  Group Topic/Focus:  Emotional Education:   The focus of this group is to discuss what feelings/emotions are, and how they are experienced.    Participation Level:  Active  Participation Quality:  Appropriate  Affect:  Appropriate  Cognitive:  Appropriate  Insight: Appropriate  Engagement in Group:  Engaged  Modes of Intervention:  Activity  Additional Comments:    Camellia HERO Nikoloz Huy 04/17/2024, 11:22 AM

## 2024-04-18 DIAGNOSIS — F22 Delusional disorders: Secondary | ICD-10-CM | POA: Diagnosis not present

## 2024-04-18 NOTE — Plan of Care (Signed)

## 2024-04-18 NOTE — Progress Notes (Incomplete)
(  Sleep Hours) - (Any PRNs that were needed, meds refused, or side effects to meds)-  (Any disturbances and when (visitation, over night)- (Concerns raised by the patient)-  (SI/HI/AVH)-

## 2024-04-18 NOTE — Group Note (Signed)
 Date:  04/18/2024 Time:  10:56 AM  Group Topic/Focus:  Fresh air therapy with music    Participation Level:  Did Not Attend    [name] 04/18/2024, 10:56 AM

## 2024-04-18 NOTE — Plan of Care (Signed)
   Problem: Clinical Measurements: Goal: Ability to maintain clinical measurements within normal limits will improve Outcome: Progressing Goal: Will remain free from infection Outcome: Progressing Goal: Cardiovascular complication will be avoided Outcome: Progressing   Problem: Activity: Goal: Risk for activity intolerance will decrease Outcome: Progressing   Problem: Nutrition: Goal: Adequate nutrition will be maintained Outcome: Progressing

## 2024-04-18 NOTE — BH IP Treatment Plan (Addendum)
 Interdisciplinary Treatment and Diagnostic Plan Update  04/18/2024 Time of Session: 3:00 PM  Benjamin [name] MRN: [id number]  Principal Diagnosis: Delusional disorder [facility])  Secondary Diagnoses: Principal Problem:   Delusional disorder (HCC)   Current Medications:  Current Facility-Administered Medications  Medication Dose Route Frequency Provider Last Rate Last Admin   acetaminophen  (TYLENOL ) tablet 650 mg  650 mg Oral Q6H PRN Coleman, Carolyn H, NP       alum & mag hydroxide-simeth (MAALOX/MYLANTA) 200-200-20 MG/5ML suspension 30 mL  30 mL Oral Q4H PRN Coleman, Carolyn H, NP       docusate sodium  (COLACE) capsule 100 mg  100 mg Oral Daily Jadapalle, Sree, MD   100 mg at 04/18/24 9077   fluPHENAZine  (PROLIXIN ) tablet 5 mg  5 mg Oral BID Jadapalle, Sree, MD   5 mg at 04/18/24 9077   Or   fluPHENAZine  (PROLIXIN ) injection 5 mg  5 mg Intramuscular BID Jadapalle, Sree, MD       magnesium  hydroxide (MILK OF MAGNESIA) suspension 30 mL  30 mL Oral Daily PRN Mardy Elveria DEL, NP       OLANZapine  (ZYPREXA ) injection 5 mg  5 mg Intramuscular TID PRN Coleman, Carolyn H, NP   5 mg at 04/12/24 2230   OLANZapine  zydis (ZYPREXA ) disintegrating tablet 5 mg  5 mg Oral TID PRN Coleman, Carolyn H, NP       OLANZapine  zydis (ZYPREXA ) disintegrating tablet 7.5 mg  7.5 mg Oral QHS Jadapalle, Sree, MD       PTA Medications: Medications Prior to Admission  Medication Sig Dispense Refill Last Dose/Taking   docusate sodium  (COLACE) 100 MG capsule Take 100 mg by mouth daily.      omega-3 acid ethyl esters (LOVAZA) 1 g capsule Take 1 g by mouth daily.       Patient Stressors: Traumatic event    Patient Strengths: Communication skills   Treatment Modalities: Medication Management, Group therapy, Case management,  1 to 1 session with clinician, Psychoeducation, Recreational therapy.   Physician Treatment Plan for Primary Diagnosis: Delusional disorder [facility]) Long Term Goal(s): Improvement in symptoms so  as ready for discharge   Short Term Goals: Ability to identify changes in lifestyle to reduce recurrence of condition will improve Ability to verbalize feelings will improve Ability to disclose and discuss suicidal ideas Ability to demonstrate self-control will improve Ability to identify and develop effective coping behaviors will improve  Medication Management: Evaluate patient's response, side effects, and tolerance of medication regimen.  Therapeutic Interventions: 1 to 1 sessions, Unit Group sessions and Medication administration.  Evaluation of Outcomes: Not Progressing  Physician Treatment Plan for Secondary Diagnosis: Principal Problem:   Delusional disorder (HCC)  Long Term Goal(s): Improvement in symptoms so as ready for discharge   Short Term Goals: Ability to identify changes in lifestyle to reduce recurrence of condition will improve Ability to verbalize feelings will improve Ability to disclose and discuss suicidal ideas Ability to demonstrate self-control will improve Ability to identify and develop effective coping behaviors will improve     Medication Management: Evaluate patient's response, side effects, and tolerance of medication regimen.  Therapeutic Interventions: 1 to 1 sessions, Unit Group sessions and Medication administration.  Evaluation of Outcomes: Not Progressing   RN Treatment Plan for Primary Diagnosis: Delusional disorder [facility]) Long Term Goal(s): Knowledge of disease and therapeutic regimen to maintain health will improve  Short Term Goals: Ability to remain free from injury will improve, Ability to verbalize frustration and anger appropriately will improve, Ability  to demonstrate self-control, Ability to participate in decision making will improve, Ability to verbalize feelings will improve, Ability to disclose and discuss suicidal ideas, Ability to identify and develop effective coping behaviors will improve, and Compliance with prescribed  medications will improve  Medication Management: RN will administer medications as ordered by provider, will assess and evaluate patient's response and provide education to patient for prescribed medication. RN will report any adverse and/or side effects to prescribing provider.  Therapeutic Interventions: 1 on 1 counseling sessions, Psychoeducation, Medication administration, Evaluate responses to treatment, Monitor vital signs and CBGs as ordered, Perform/monitor CIWA, COWS, AIMS and Fall Risk screenings as ordered, Perform wound care treatments as ordered.  Evaluation of Outcomes: Not Progressing   LCSW Treatment Plan for Primary Diagnosis: Delusional disorder [facility]) Long Term Goal(s): Safe transition to appropriate next level of care at discharge, Engage patient in therapeutic group addressing interpersonal concerns.  Short Term Goals: Engage patient in aftercare planning with referrals and resources, Increase social support, Increase ability to appropriately verbalize feelings, Increase emotional regulation, Facilitate acceptance of mental health diagnosis and concerns, Facilitate patient progression through stages of change regarding substance use diagnoses and concerns, Identify triggers associated with mental health/substance abuse issues, and Increase skills for wellness and recovery  Therapeutic Interventions: Assess for all discharge needs, 1 to 1 time with Social worker, Explore available resources and support systems, Assess for adequacy in community support network, Educate family and significant other(s) on suicide prevention, Complete Psychosocial Assessment, Interpersonal group therapy.  Evaluation of Outcomes: Not Progressing  Progress in Treatment: Attending groups: No Participating in groups: No Taking medication as prescribed: Yes. Toleration medication: Yes. Family/Significant other contact made: Yes, individual(s) contacted:  SPE completed with Luthor Weathers, pt brother  [phone]) on 03/31/2024. Patient understands diagnosis: Yes. Discussing patient identified problems/goals with staff: Yes. Medical problems stabilized or resolved: Yes. Denies suicidal/homicidal ideation: Yes. Issues/concerns per patient self-inventory: No. Other:    New problem(s) identified: No, Describe:  None identified Update 04/02/24: No changes at this time. Update 04/07/24: No changes at this time  Update 04/12/24: No changes at this time Update 04/18/24: No changes at this time        New Short Term/Long Term Goal(s): elimination of symptoms of psychosis, medication management for mood stabilization; elimination of SI thoughts; development of comprehensive mental wellness plan. Update 04/02/24: No changes at this time. Update 04/07/24: No changes at this time. Update 04/12/24: No changes at this time Update 04/18/24: No changes at this time    Patient Goals:  Get out of here, I want to get healed up so I can get out of here Update 04/02/24: No changes at this time. Update 04/07/24: No changes at this time .Update 04/12/24: No changes at this time Update 04/18/24: No changes at this time    Discharge Plan or Barriers: CSW will assist with appropriate discharge planning  Update 04/02/24: No changes at this time. Update 04/07/24: No changes at this time.Update 04/12/24: No changes at this time Update 04/18/24: CSW submitted report to APS. Care team looking at guardianship for pt at this time    Reason for Continuation of Hospitalization: Delusions  Medication stabilization   Estimated Length of Stay: 1 to 7 days Update 04/02/24: TBD. Update 04/07/24: TBD Update 04/12/24:TBD Update 04/18/24: TBD  Last 3 [place] Suicide Severity Risk Score: Flowsheet Row Admission (Current) from 03/27/2024 in [facility] ED from 03/26/2024 in [facility] Emergency Department at [facility] ED from 03/25/2024 in [place]  [facility]  C-SSRS RISK CATEGORY No Risk No Risk No  Risk    Last PHQ 2/9 Scores:     No data to display          Scribe for Treatment Team: Lum JONETTA Raynaldo ISRAEL 04/18/2024 3:55 PM

## 2024-04-18 NOTE — Progress Notes (Signed)
 [facility] MD Progress Note  04/18/2024 11:56 PM Benjamin [name]  MRN:  [id number] Benjamin [name] is a 78 year old male who presents to the inpatient geriatric psych unit after jumping from a two story building. Patient was originally seen at [facility] who referred him to the ED who admitted him here on 03/27/2024. Patient reports that people from the [place] gang were trying to kill him and the only escape was through the bedroom window on the second floor. He currently lives in the house with [name] and his family and has been living with them for 3-4 years without any problems. On 03/19/2024 he went to the doctor and when he got back home the family wouldn't let him leave. He states that after he realized the gang was going to kill him he barricaded the door with his bed and jumped out the window. When he landed he ran away until one of his neighbors found him and called 911.   Subjective: Chart reviewed and discussed with the treatment team.  Patient is noted to be resting in his room.  He is more awake, alert and oriented.  He continues to remain paranoid stating the staff is trying to hurt him.  Per nursing he is taking his medications and is coming out to have his food.  Patient is not displaying any aggressive behaviors.  Patient denies SI/HI/plan and denies hallucinations.  Discussed with the treatment team.  APS referral was already made and DSS is looking into pursuing guardianship.    Sleep: Fair  Appetite:  Fair  Past Psychiatric History: see h&P  Family History:  Family History  Problem Relation Age of Onset   Colon cancer Neg Hx    Colon polyps Neg Hx    Stomach cancer Neg Hx    Esophageal cancer Neg Hx    Social History:  Social History   Substance and Sexual Activity  Alcohol Use No     Social History   Substance and Sexual Activity  Drug Use No    Social History   Socioeconomic History   Marital status: Single    Spouse name: Not on file   Number  of children: Not on file   Years of education: Not on file   Highest education level: Not on file  Occupational History   Not on file  Tobacco Use   Smoking status: Former    Types: Cigarettes   Smokeless tobacco: Never  Vaping Use   Vaping status: Never Used  Substance and Sexual Activity   Alcohol use: No   Drug use: No   Sexual activity: Not on file  Other Topics Concern   Not on file  Social History Narrative   Not on file   Social Drivers of Health   Financial Resource Strain: Not on file  Food Insecurity: No Food Insecurity (03/27/2024)   Hunger Vital Sign    Worried About Running Out of Food in the Last Year: Never true    Ran Out of Food in the Last Year: Never true  Transportation Needs: No Transportation Needs (03/27/2024)   PRAPARE - Administrator, Civil Service (Medical): No    Lack of Transportation (Non-Medical): No  Physical Activity: Not on file  Stress: Not on file  Social Connections: Moderately Integrated (03/27/2024)   Social Connection and Isolation Panel    Frequency of Communication with Friends and Family: Twice a week    Frequency of Social Gatherings with Friends and Family: Once a  week    Attends Religious Services: 1 to 4 times per year    Active Member of Clubs or Organizations: No    Attends Engineer, structural: 1 to 4 times per year    Marital Status: Divorced   Past Medical History:  Past Medical History:  Diagnosis Date   Allergy    Arthritis    back   Prostate cancer (HCC) dx'd 2010   surg only    Past Surgical History:  Procedure Laterality Date   COLONOSCOPY     EYE SURGERY     PROSTATE SURGERY      Current Medications: Current Facility-Administered Medications  Medication Dose Route Frequency Provider Last Rate Last Admin   acetaminophen  (TYLENOL ) tablet 650 mg  650 mg Oral Q6H PRN Coleman, Carolyn H, NP       alum & mag hydroxide-simeth (MAALOX/MYLANTA) 200-200-20 MG/5ML suspension 30 mL  30 mL Oral  Q4H PRN Coleman, Carolyn H, NP       docusate sodium  (COLACE) capsule 100 mg  100 mg Oral Daily Miriam Liles, MD   100 mg at 04/18/24 9077   fluPHENAZine  (PROLIXIN ) tablet 5 mg  5 mg Oral BID Kailash Hinze, MD   5 mg at 04/18/24 2049   Or   fluPHENAZine  (PROLIXIN ) injection 5 mg  5 mg Intramuscular BID Zion Lint, MD       magnesium  hydroxide (MILK OF MAGNESIA) suspension 30 mL  30 mL Oral Daily PRN Coleman, Carolyn H, NP       OLANZapine  (ZYPREXA ) injection 5 mg  5 mg Intramuscular TID PRN Coleman, Carolyn H, NP   5 mg at 04/12/24 2230   OLANZapine  zydis (ZYPREXA ) disintegrating tablet 5 mg  5 mg Oral TID PRN Coleman, Carolyn H, NP       OLANZapine  zydis (ZYPREXA ) disintegrating tablet 7.5 mg  7.5 mg Oral QHS Cornell Gaber, MD   7.5 mg at 04/18/24 2151    Lab Results:  No results found for this or any previous visit (from the past 48 hours).   Blood Alcohol level:  Lab Results  Component Value Date   [facility] <15 03/26/2024    Metabolic Disorder Labs: Lab Results  Component Value Date   HGBA1C 5.6 04/02/2024   MPG 114 04/02/2024   No results found for: PROLACTIN Lab Results  Component Value Date   CHOL 146 04/02/2024   TRIG 37 04/02/2024   HDL 43 04/02/2024   CHOLHDL 3.4 04/02/2024   VLDL 7 04/02/2024   LDLCALC 96 04/02/2024    Physical Findings: AIMS:  , ,  ,  ,    CIWA:    COWS:      Psychiatric Specialty Exam:  Presentation  General Appearance:  Appropriate for Environment; Bizarre  Eye Contact: Fair  Speech: Clear and Coherent  Speech Volume: Normal    Mood and Affect  Mood: Anxious  Affect: Congruent   Thought Process  Thought Processes: Disorganized  Descriptions of Associations:Intact  Orientation:Full (Time, Place and Person)  Thought Content: Paranoia at baseline Hallucinations: Denies  Ideas of Reference: Paranoia at baseline Suicidal Thoughts: Denies  Homicidal Thoughts: Denies   Sensorium  Memory: Immediate  Fair; Recent Fair; Remote Fair  Judgment: Impaired  Insight: Shallow   Executive Functions  Concentration: Fair  Attention Span: Fair  Recall: [employer] of Knowledge: Fair  Language: Fair   Psychomotor Activity  Psychomotor Activity: No data recorded  Musculoskeletal: Strength & Muscle Tone: within normal limits Gait & Station:  normal Assets  Assets: Manufacturing systems engineer; Resilience    Physical Exam: Physical Exam Vitals and nursing note reviewed.    ROS Blood pressure 118/65, pulse 70, temperature 98.6 F (37 C), resp. rate 14, height 6' (1.829 m), weight 100 kg, SpO2 100%. Body mass index is 29.91 kg/m.  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  Patient needs to be monitored closely for ongoing psychosis and paranoid delusions.  Given patient's confusion and chronic paranoia, no family support, APS report has been made and it got screened him for further evaluation by APS.   Differential diagnosis include: unspecified psychosis, delusion disorder, dementia, schizophrenia/schizoaffective disorder   Treatment Plan Summary: Requested the social work team to reach out to APS for safety concerns in the community as patient has chronic paranoia and confusion and is currently homeless   Safety and Monitoring:             -- Voluntary admission to inpatient psychiatric unit for safety, stabilization and treatment             -- Daily contact with patient to assess and evaluate symptoms and progress in treatment             -- Patient's case to be discussed in multi-disciplinary team meeting             -- Observation Level: q15 minute checks             -- Vital signs:  q12 hours             -- Precautions: suicide, elopement, and  assault   2. Psychiatric Diagnoses and Treatment:              Prolixin  5 mg p.o. twice daily and IM if he refuses p.o. medication. Reduced zyprexa  to 5 mg at bedtime only as patient was too sedated  Approved nonemergency forced medication at this time- by 2 physicians  -- The risks/benefits/side-effects/alternatives to this medication were discussed in detail with the patient and time was given for questions. The patient consents to medication trial.                -- Metabolic profile and EKG monitoring obtained while on an atypical antipsychotic (BMI: Lipid Panel: HbgA1c: QTc:)              -- Encouraged patient to participate in unit milieu and in scheduled group therapies                            3. Medical Issues Being Addressed:    No urgent medical needs noted  4. Discharge Planning:   -- Social work and case management to assist with discharge planning and identification of hospital follow-up needs prior to discharge  -- Estimated LOS: 3-4 days  Emary Zalar, MD 04/18/2024, 11:56 PM

## 2024-04-18 NOTE — Progress Notes (Signed)
   04/18/24 0900  Psych Admission Type (Psych Patients Only)  Admission Status Involuntary  Psychosocial Assessment  Patient Complaints Anxiety  Eye Contact Brief  Facial Expression Anxious  Affect Preoccupied  Speech Soft  Interaction Isolative  Motor Activity Restless  Appearance/Hygiene In scrubs  Behavior Characteristics Cooperative;Guarded;Anxious  Mood Suspicious;Anxious  Thought Process  Coherency Disorganized  Content Delusions  Delusions Paranoid  Perception Hallucinations  Hallucination Auditory  Judgment Poor  Confusion Moderate  Danger to Self  Current suicidal ideation? Denies  Agreement Not to Harm Self Yes  Description of Agreement verbal  Danger to Others  Danger to Others None reported or observed

## 2024-04-19 DIAGNOSIS — F22 Delusional disorders: Secondary | ICD-10-CM | POA: Diagnosis not present

## 2024-04-19 MED ORDER — OLANZAPINE 5 MG PO TBDP
10.0000 mg | ORAL_TABLET | Freq: Every day | ORAL | Status: DC
  Administered 2024-04-20 – 2024-04-28 (×9): 10 mg via ORAL
  Filled 2024-04-19 (×9): qty 2

## 2024-04-19 NOTE — Progress Notes (Signed)
   04/18/24 1345  Spiritual Encounters  Type of Visit Follow up  Care provided to: Patient  Referral source Chaplain assessment  Reason for visit Routine spiritual support  OnCall Visit No   I provided follow up care for Mr. Chibuikem Thang.  Mr. Dollar was resting in bed at time of visit. He welcomed my visit. While he tends to engage his [place]le[place] faith as a source of comfort and meaning, today the conversation was much more entrenched in paranoid thoughts. Nevertheless, I invited Mr. Pereira into relational connection with me and also with his faith to help interrupt delusional thoughts. I invited him to Spirituality Group at 2pm but he declined to participate.  Will continue to follow. Flannery Cavallero L. Fredrica, M.Div

## 2024-04-19 NOTE — Plan of Care (Signed)
   Problem: Education: Goal: Knowledge of Benjamin [name] General Education information/materials will improve Outcome: Progressing Goal: Emotional status will improve Outcome: Progressing Goal: Mental status will improve Outcome: Progressing Goal: Verbalization of understanding the information provided will improve Outcome: Progressing   Problem: Activity: Goal: Interest or engagement in activities will improve Outcome: Progressing Goal: Sleeping patterns will improve Outcome: Progressing   Problem: Coping: Goal: Ability to verbalize frustrations and anger appropriately will improve Outcome: Progressing Goal: Ability to demonstrate self-control will improve Outcome: Progressing

## 2024-04-19 NOTE — Progress Notes (Signed)
 Pt remains disorganized and suspicious. Flat affect. Isolates in room. Limited interaction with peers and staff. Did not attend group. Po medications given as prescribed per MD order. Mouth checks completed. Pt observed wandering in and out of room at 0300, states he's looking for his other shoe. Pt redirected back to his room. Denies SI/HI/AVH. Q 15 min checks maintained for safety. No c/o pain/discomfort noted.

## 2024-04-19 NOTE — Plan of Care (Signed)
   Problem: Coping: Goal: Ability to verbalize frustrations and anger appropriately will improve Outcome: Progressing Goal: Ability to demonstrate self-control will improve Outcome: Progressing

## 2024-04-19 NOTE — Group Note (Signed)
 Recreation Therapy Group Note   Group Topic:Other  Group Date: 04/19/2024 Start Time: 1400 End Time: 1445 Facilitators: Celestia Jeoffrey BRAVO, LRT, CTRS Location: Dayroom  Activity Description/Intervention: Therapeutic Drumming. Patients with peers and staff were given the opportunity to engage in a leader facilitated [facility] Group Empowerment Drumming Circle with staff from the [employer], in partnership with [employer]. Teaching laboratory technician and trained [employer], Norleen Mon leading with LRT observing and documenting intervention and pt response. This evidenced-based practice targets 7 areas of health and wellbeing in the human experience including: stress-reduction, exercise, self-expression, camaraderie/support, nurturing, spirituality, and music-making (leisure).    Goal Area(s) Addresses:  Patient will engage in pro-social way in music group.  Patient will follow directions of drum leader on the first prompt. Patient will demonstrate no behavioral issues during group.  Patient will identify if a reduction in stress level occurs as a result of participation in therapeutic drum circle.     Affect/Mood: N/A   Participation Level: Did not attend    Clinical Observations/Individualized Feedback: Patient did not attend group.   Plan: Continue to engage patient in RT group sessions 2-3x/week.   [address] LRT, CTRS 04/19/2024 4:22 PM

## 2024-04-19 NOTE — Progress Notes (Signed)
   04/19/24 1400  Psych Admission Type (Psych Patients Only)  Admission Status Involuntary  Psychosocial Assessment  Patient Complaints Suspiciousness  Eye Contact Brief  Facial Expression Anxious  Affect Preoccupied  Speech Soft  Interaction Isolative  Motor Activity Restless  Appearance/Hygiene In scrubs  Behavior Characteristics Anxious  Mood Preoccupied  Thought Process  Coherency Tangential;Disorganized  Content Delusions  Delusions Paranoid  Perception Hallucinations  Hallucination Auditory  Judgment Impaired  Confusion Mild  Danger to Self  Current suicidal ideation? Denies  Agreement Not to Harm Self Yes  Description of Agreement verbal  Danger to Others  Danger to Others None reported or observed

## 2024-04-19 NOTE — Progress Notes (Signed)
   04/18/24 2100  Psych Admission Type (Psych Patients Only)  Admission Status Involuntary  Psychosocial Assessment  Patient Complaints Suspiciousness  Eye Contact Brief  Facial Expression Anxious  Affect Preoccupied  Speech Soft  Interaction Isolative  Motor Activity Restless  Appearance/Hygiene In scrubs  Behavior Characteristics Anxious  Mood Suspicious  Thought Process  Coherency Disorganized  Content Delusions  Delusions Paranoid  Perception Hallucinations  Hallucination Auditory  Judgment Impaired  Confusion Moderate  Danger to Self  Current suicidal ideation? Denies

## 2024-04-19 NOTE — Progress Notes (Signed)
 [facility] MD Progress Note  04/19/2024 10:42 PM Benjamin [name]  MRN:  [id number] Benjamin [name] is a 78 year old male who presents to the inpatient geriatric psych unit after jumping from a two story building. Patient was originally seen at [facility] who referred him to the ED who admitted him here on 03/27/2024. Patient reports that people from the [place] gang were trying to kill him and the only escape was through the bedroom window on the second floor. He currently lives in the house with [name] and his family and has been living with them for 3-4 years without any problems. On 03/19/2024 he went to the doctor and when he got back home the family wouldn't let him leave. He states that after he realized the gang was going to kill him he barricaded the door with his bed and jumped out the window. When he landed he ran away until one of his neighbors found him and called 911.   Subjective: Chart reviewed and discussed with the treatment team.  Patient is noted to be sitting in his bed.  Provider encouraged him to go outdoors to attend the recreational group but patient continues to make paranoid statements saying everybody is out to kill him, nurses and the provider.  He keeps blaming the provider that she knows what she is doing and he knows her tricks.  He does not answer the question of hallucinations.  He is not endorsing SI/HI/plan.   Sleep: Fair  Appetite:  Fair  Past Psychiatric History: see h&P  Family History:  Family History  Problem Relation Age of Onset   Colon cancer Neg Hx    Colon polyps Neg Hx    Stomach cancer Neg Hx    Esophageal cancer Neg Hx    Social History:  Social History   Substance and Sexual Activity  Alcohol Use No     Social History   Substance and Sexual Activity  Drug Use No    Social History   Socioeconomic History   Marital status: Single    Spouse name: Not on file   Number of children: Not on file   Years of education: Not on  file   Highest education level: Not on file  Occupational History   Not on file  Tobacco Use   Smoking status: Former    Types: Cigarettes   Smokeless tobacco: Never  Vaping Use   Vaping status: Never Used  Substance and Sexual Activity   Alcohol use: No   Drug use: No   Sexual activity: Not on file  Other Topics Concern   Not on file  Social History Narrative   Not on file   Social Drivers of Health   Financial Resource Strain: Not on file  Food Insecurity: No Food Insecurity (03/27/2024)   Hunger Vital Sign    Worried About Running Out of Food in the Last Year: Never true    Ran Out of Food in the Last Year: Never true  Transportation Needs: No Transportation Needs (03/27/2024)   PRAPARE - Administrator, Civil Service (Medical): No    Lack of Transportation (Non-Medical): No  Physical Activity: Not on file  Stress: Not on file  Social Connections: Moderately Integrated (03/27/2024)   Social Connection and Isolation Panel    Frequency of Communication with Friends and Family: Twice a week    Frequency of Social Gatherings with Friends and Family: Once a week    Attends Religious Services: 1 to 4  times per year    Active Member of Clubs or Organizations: No    Attends Banker Meetings: 1 to 4 times per year    Marital Status: Divorced   Past Medical History:  Past Medical History:  Diagnosis Date   Allergy    Arthritis    back   Prostate cancer (HCC) dx'd 2010   surg only    Past Surgical History:  Procedure Laterality Date   COLONOSCOPY     EYE SURGERY     PROSTATE SURGERY      Current Medications: Current Facility-Administered Medications  Medication Dose Route Frequency Provider Last Rate Last Admin   acetaminophen  (TYLENOL ) tablet 650 mg  650 mg Oral Q6H PRN Coleman, Carolyn H, NP       alum & mag hydroxide-simeth (MAALOX/MYLANTA) 200-200-20 MG/5ML suspension 30 mL  30 mL Oral Q4H PRN Coleman, Carolyn H, NP       docusate sodium   (COLACE) capsule 100 mg  100 mg Oral Daily Nioma Mccubbins, MD   100 mg at 04/19/24 9062   fluPHENAZine  (PROLIXIN ) tablet 5 mg  5 mg Oral BID Kin Galbraith, MD   5 mg at 04/19/24 2025   Or   fluPHENAZine  (PROLIXIN ) injection 5 mg  5 mg Intramuscular BID Clatie Kessen, MD       magnesium  hydroxide (MILK OF MAGNESIA) suspension 30 mL  30 mL Oral Daily PRN Coleman, Carolyn H, NP       OLANZapine  (ZYPREXA ) injection 5 mg  5 mg Intramuscular TID PRN Mardy Elveria DEL, NP   5 mg at 04/12/24 2230   [START ON 04/20/2024] OLANZapine  zydis (ZYPREXA ) disintegrating tablet 10 mg  10 mg Oral QHS Tyla Burgner, MD       OLANZapine  zydis (ZYPREXA ) disintegrating tablet 5 mg  5 mg Oral TID PRN Mardy Elveria DEL, NP        Lab Results:  No results found for this or any previous visit (from the past 48 hours).   Blood Alcohol level:  Lab Results  Component Value Date   [facility] <15 03/26/2024    Metabolic Disorder Labs: Lab Results  Component Value Date   HGBA1C 5.6 04/02/2024   MPG 114 04/02/2024   No results found for: PROLACTIN Lab Results  Component Value Date   CHOL 146 04/02/2024   TRIG 37 04/02/2024   HDL 43 04/02/2024   CHOLHDL 3.4 04/02/2024   VLDL 7 04/02/2024   LDLCALC 96 04/02/2024    Physical Findings: AIMS:  , ,  ,  ,    CIWA:    COWS:      Psychiatric Specialty Exam:  Presentation  General Appearance:  Appropriate for Environment; Bizarre  Eye Contact: Fair  Speech: Clear and Coherent  Speech Volume: Normal    Mood and Affect  Mood: Anxious  Affect: Congruent   Thought Process  Thought Processes: Disorganized  Descriptions of Associations:Intact  Orientation:Full (Time, Place and Person)  Thought Content: Paranoia at baseline Hallucinations: Denies  Ideas of Reference: Paranoia at baseline Suicidal Thoughts: Denies  Homicidal Thoughts: Denies   Sensorium  Memory: Immediate Fair; Recent Fair; Remote  Fair  Judgment: Impaired  Insight: Shallow   Executive Functions  Concentration: Fair  Attention Span: Fair  Recall: [employer] of Knowledge: Fair  Language: Fair   Psychomotor Activity  Psychomotor Activity: No data recorded  Musculoskeletal: Strength & Muscle Tone: within normal limits Gait & Station: normal Assets  Assets: Manufacturing systems engineer; Resilience  Physical Exam: Physical Exam Vitals and nursing note reviewed.    ROS Blood pressure (!) 105/48, pulse 68, temperature 98.2 F (36.8 C), resp. rate 18, height 6' (1.829 m), weight 100 kg, SpO2 98%. Body mass index is 29.91 kg/m.  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  Patient needs to be monitored closely for ongoing psychosis and paranoid delusions.  Given patient's confusion and chronic paranoia, no family support, APS report has been made and it got screened him for further evaluation by APS.   Differential diagnosis include: unspecified psychosis, delusion disorder, dementia, schizophrenia/schizoaffective disorder   Treatment Plan Summary: Requested the social work team to reach out to APS for safety concerns in the community as patient has chronic paranoia and confusion and is currently homeless   Safety and Monitoring:             -- InVoluntary admission to inpatient psychiatric unit for safety, stabilization and treatment             -- Daily contact with patient to assess and evaluate symptoms and progress in treatment             -- Patient's case to be discussed in multi-disciplinary team meeting             -- Observation Level: q15 minute checks             -- Vital signs:  q12 hours             -- Precautions: suicide, elopement, and assault   2.  Psychiatric Diagnoses and Treatment:              Prolixin  5 mg p.o. twice daily and IM if he refuses p.o. medication. Reduced zyprexa  10mg  at bedtime only as patient was too sedated  Approved nonemergency forced medication at this time- by 2 physicians  -- The risks/benefits/side-effects/alternatives to this medication were discussed in detail with the patient and time was given for questions. The patient consents to medication trial.                -- Metabolic profile and EKG monitoring obtained while on an atypical antipsychotic (BMI: Lipid Panel: HbgA1c: QTc:)              -- Encouraged patient to participate in unit milieu and in scheduled group therapies                            3. Medical Issues Being Addressed:    No urgent medical needs noted  4. Discharge Planning:   -- Social work and case management to assist with discharge planning and identification of hospital follow-up needs prior to discharge  -- Estimated LOS: 3-4 days  Annibelle Brazie, MD 04/19/2024, 10:42 PM

## 2024-04-19 NOTE — Group Note (Signed)
 Date:  04/19/2024 Time:  11:03 AM  Group Topic/Focus:  Music therapy with word search and conversation.    Participation Level:  Did Not Attend    [name] 04/19/2024, 11:03 AM

## 2024-04-20 DIAGNOSIS — F22 Delusional disorders: Secondary | ICD-10-CM | POA: Diagnosis not present

## 2024-04-20 NOTE — Plan of Care (Signed)

## 2024-04-20 NOTE — Group Note (Signed)
 Date:  04/20/2024 Time:  12:09 PM  Group Topic/Focus:  Boundaries, Self Love, Deep Breathing This group focused on promoting emotional well-being through discussion and role play. Patients explored the importance of healthy personal boundaries in maintaining safety and self-respect and identifying examples of boundary-setting in daily life.   Patients were able to identify the importance of needs based off of Maslow's Hierarchy pyramid and were guided in conversations regarding self-worth and self-love, emphasizing self-care and affirming one's own needs without guilt.  The group concluded with a deep-breathing exercise to reinforce self-regulation and relaxation skills.  Participation Level:  Did Not Attend  Participation Quality:  N/A  Affect:  N/A  Cognitive:  N/A  Insight: None  Engagement in Group:  N/A  Modes of Intervention:  N/A  Additional Comments:  N/A  Kristi CHRISTELLA Plaza 04/20/2024, 12:09 PM

## 2024-04-20 NOTE — Progress Notes (Addendum)
   04/20/24 2000  Psych Admission Type (Psych Patients Only)  Admission Status Involuntary  Psychosocial Assessment  Patient Complaints Anxiety;Suspiciousness  Eye Contact Brief  Facial Expression Anxious  Affect Preoccupied  Speech Soft  Interaction Minimal  Motor Activity Other (Comment) (wnl)  Appearance/Hygiene In scrubs  Behavior Characteristics Cooperative;Appropriate to situation  Mood Anxious  Thought Process  Coherency Tangential  Content Delusions  Delusions Paranoid  Perception Hallucinations  Hallucination None reported or observed  Judgment Impaired  Confusion Mild  Danger to Self  Current suicidal ideation? Denies  Danger to Others  Danger to Others None reported or observed   Progress note   D: Pt seen in his room sitting on the bed. Pt denies SI, HI, AVH. Pt rates pain  0/10. Pt rates anxiety  0/10 and depression  0/10. Pt doesn't express anxiety but he appears to be anxious about his discharge and persons on the unit. Pt attends groups and meals but then retreats to his room. Pt still believes people are after him and that they are conspiring with staff. He believes that he will not be let go from this hospital. No other concerns noted at this time.  A: Pt provided support and encouragement. Pt given scheduled medication as prescribed. PRNs as appropriate. Q15 min checks for safety.   R: Pt safe on the unit. Will continue to monitor.

## 2024-04-20 NOTE — Plan of Care (Signed)
   Problem: Education: Goal: Emotional status will improve Outcome: Progressing Goal: Mental status will improve Outcome: Progressing

## 2024-04-20 NOTE — Progress Notes (Signed)
   04/20/24 1200  Psych Admission Type (Psych Patients Only)  Admission Status Involuntary  Psychosocial Assessment  Patient Complaints Suspiciousness  Eye Contact Brief  Facial Expression Anxious  Affect Preoccupied  Speech Soft  Interaction Isolative  Motor Activity Restless  Appearance/Hygiene In scrubs  Behavior Characteristics Appropriate to situation  Mood Preoccupied  Thought Process  Coherency Tangential;Disorganized  Content Delusions  Delusions Paranoid  Perception Hallucinations  Hallucination Auditory  Judgment Impaired  Confusion Mild  Danger to Self  Current suicidal ideation? Denies  Agreement Not to Harm Self Yes  Description of Agreement verbal  Danger to Others  Danger to Others None reported or observed

## 2024-04-20 NOTE — Group Note (Signed)
 Physical/Occupational Therapy Group Note  Group Topic: Estate manager/land agent, Adaptive Equipment for ADLs  Group Date: 04/20/2024 Start Time: 1300 End Time: 1327 Facilitators: Lavonda Therisa CROME, OT   Group Description: Group educated on safety considerations/modifications with bathing, dressing and ADL routines to maximize independence and minimize fall risk with tasks.  Reviewed and demonstrated use of shower chair and tub transfer bench as appropriate.  Reviewed and demonstrated use of adaptive equipment (sock aide, reacher, long-handled sponge) available for ADL tasks.  Reviewed and demonstrated role of activity pacing and energy conservation with functional activities.  Provided handout with visual reference of available equipment.  Therapeutic Goal(s):  Verbalize and demonstrate safe technique for tub/shower transfers with DME/adaptive equipment as needed. Verbalize and demonstrate appropriate use of adaptive equipment with bathing, dressing and ADL routine. Verbalize and demonstrate appropriate use of activity pacing and energy conservation with bathing, dressing and ADL routine.  Individual Participation: patient did not attend   Participation Level: Did not attend   Participation Quality:    Behavior:    Speech/Thought Process:    Affect/Mood:    Insight:    Judgement:    Modes of Intervention:   Patient Response to Interventions:    Plan: Continue to engage patient in PT/OT groups 1 - 2x/week.  04/20/2024  Therisa CROME Lavonda, OT Therisa Lavonda, OTD OTR/L  04/20/24, 2:34 PM

## 2024-04-20 NOTE — Group Note (Signed)
 LCSW Group Therapy Note  Group Date: 04/19/2024 Start Time: 1300 End Time: 1345   Type of Therapy and Topic:  Group Therapy - Healthy vs Unhealthy Coping Skills  Participation Level:  Did Not Attend   Description of Group The focus of this group was to determine what unhealthy coping techniques typically are used by group members and what healthy coping techniques would be helpful in coping with various problems. Patients were guided in becoming aware of the differences between healthy and unhealthy coping techniques. Patients were asked to identify 2-3 healthy coping skills they would like to learn to use more effectively.  Therapeutic Goals Patients learned that coping is what human beings do all day long to deal with various situations in their lives Patients defined and discussed healthy vs unhealthy coping techniques Patients identified their preferred coping techniques and identified whether these were healthy or unhealthy Patients determined 2-3 healthy coping skills they would like to become more familiar with and use more often. Patients provided support and ideas to each other   Summary of Patient Progress:  X   Therapeutic Modalities Cognitive Behavioral Therapy Motivational Interviewing  Benjamin [name], [place] 04/20/2024  9:46 AM

## 2024-04-20 NOTE — Progress Notes (Signed)
 Pt remains disorganized and confused. Pt is irritable and easily agitated. Flat affect. Guarded and suspicious.  Limited interaction with peers and staff. Refused snack when offered. Po med compliant. Denies SI/HI/AVH. Q 15 min checks maintained for safety. No c/o pain/discomfort noted.     04/19/24 2100  Psych Admission Type (Psych Patients Only)  Admission Status Involuntary  Psychosocial Assessment  Patient Complaints Suspiciousness  Eye Contact Brief  Facial Expression Anxious  Affect Preoccupied  Speech Slow  Interaction Isolative  Motor Activity Restless  Appearance/Hygiene Disheveled;In scrubs  Behavior Characteristics Irritable  Mood Preoccupied  Thought Process  Coherency Disorganized  Content Delusions  Delusions Paranoid  Perception Hallucinations  Hallucination Auditory  Judgment Impaired  Confusion Mild  Danger to Self  Current suicidal ideation? Denies

## 2024-04-20 NOTE — Progress Notes (Signed)
 [facility] MD Progress Note  04/20/2024 1:00 PM Benjamin [name]  MRN:  [id number] Benjamin [name] is a 78 year old male who presents to the inpatient geriatric psych unit after jumping from a two story building. Patient was originally seen at [facility] who referred him to the ED who admitted him here on 03/27/2024. Patient reports that people from the [place] gang were trying to kill him and the only escape was through the bedroom window on the second floor. He currently lives in the house with [name] and his family and has been living with them for 3-4 years without any problems. On 03/19/2024 he went to the doctor and when he got back home the family wouldn't let him leave. He states that after he realized the gang was going to kill him he barricaded the door with his bed and jumped out the window. When he landed he ran away until one of his neighbors found him and called 911.   Subjective: Chart reviewed and discussed with the treatment team.  On interview patient is noted to be sitting on his bed.  When provider encouraged him to go outdoors for recreational therapy he continues to decline.  He remains paranoid and when patient was asked about his safety on the unit he makes remarks that this provider is aware of what is going on and the provider is also playing tricks along with the staff members.  No aggressive behaviors reported.  He is taking the medications and is able to maintain the nutrition.   Sleep: Fair  Appetite:  Fair  Past Psychiatric History: see h&P  Family History:  Family History  Problem Relation Age of Onset   Colon cancer Neg Hx    Colon polyps Neg Hx    Stomach cancer Neg Hx    Esophageal cancer Neg Hx    Social History:  Social History   Substance and Sexual Activity  Alcohol Use No     Social History   Substance and Sexual Activity  Drug Use No    Social History   Socioeconomic History   Marital status: Single    Spouse name: Not on file    Number of children: Not on file   Years of education: Not on file   Highest education level: Not on file  Occupational History   Not on file  Tobacco Use   Smoking status: Former    Types: Cigarettes   Smokeless tobacco: Never  Vaping Use   Vaping status: Never Used  Substance and Sexual Activity   Alcohol use: No   Drug use: No   Sexual activity: Not on file  Other Topics Concern   Not on file  Social History Narrative   Not on file   Social Drivers of Health   Financial Resource Strain: Not on file  Food Insecurity: No Food Insecurity (03/27/2024)   Hunger Vital Sign    Worried About Running Out of Food in the Last Year: Never true    Ran Out of Food in the Last Year: Never true  Transportation Needs: No Transportation Needs (03/27/2024)   PRAPARE - Administrator, Civil Service (Medical): No    Lack of Transportation (Non-Medical): No  Physical Activity: Not on file  Stress: Not on file  Social Connections: Moderately Integrated (03/27/2024)   Social Connection and Isolation Panel    Frequency of Communication with Friends and Family: Twice a week    Frequency of Social Gatherings with Friends and Family:  Once a week    Attends Religious Services: 1 to 4 times per year    Active Member of Clubs or Organizations: No    Attends Banker Meetings: 1 to 4 times per year    Marital Status: Divorced   Past Medical History:  Past Medical History:  Diagnosis Date   Allergy    Arthritis    back   Prostate cancer (HCC) dx'd 2010   surg only    Past Surgical History:  Procedure Laterality Date   COLONOSCOPY     EYE SURGERY     PROSTATE SURGERY      Current Medications: Current Facility-Administered Medications  Medication Dose Route Frequency Provider Last Rate Last Admin   acetaminophen  (TYLENOL ) tablet 650 mg  650 mg Oral Q6H PRN Coleman, Carolyn H, NP       alum & mag hydroxide-simeth (MAALOX/MYLANTA) 200-200-20 MG/5ML suspension 30 mL  30 mL  Oral Q4H PRN Coleman, Carolyn H, NP       docusate sodium  (COLACE) capsule 100 mg  100 mg Oral Daily Jaeven Wanzer, MD   100 mg at 04/20/24 1010   fluPHENAZine  (PROLIXIN ) tablet 5 mg  5 mg Oral BID Kyndel Egger, MD   5 mg at 04/20/24 1010   Or   fluPHENAZine  (PROLIXIN ) injection 5 mg  5 mg Intramuscular BID Ashleen Demma, MD       magnesium  hydroxide (MILK OF MAGNESIA) suspension 30 mL  30 mL Oral Daily PRN Coleman, Carolyn H, NP       OLANZapine  (ZYPREXA ) injection 5 mg  5 mg Intramuscular TID PRN Mardy Elveria DEL, NP   5 mg at 04/12/24 2230   OLANZapine  zydis (ZYPREXA ) disintegrating tablet 10 mg  10 mg Oral QHS Rhealyn Cullen, MD       OLANZapine  zydis (ZYPREXA ) disintegrating tablet 5 mg  5 mg Oral TID PRN Mardy Elveria DEL, NP        Lab Results:  No results found for this or any previous visit (from the past 48 hours).   Blood Alcohol level:  Lab Results  Component Value Date   [facility] <15 03/26/2024    Metabolic Disorder Labs: Lab Results  Component Value Date   HGBA1C 5.6 04/02/2024   MPG 114 04/02/2024   No results found for: PROLACTIN Lab Results  Component Value Date   CHOL 146 04/02/2024   TRIG 37 04/02/2024   HDL 43 04/02/2024   CHOLHDL 3.4 04/02/2024   VLDL 7 04/02/2024   LDLCALC 96 04/02/2024    Physical Findings: AIMS:  , ,  ,  ,    CIWA:    COWS:      Psychiatric Specialty Exam:  Presentation  General Appearance:  Appropriate for Environment; Bizarre  Eye Contact: Fair  Speech: Clear and Coherent  Speech Volume: Normal    Mood and Affect  Mood: Anxious  Affect: Congruent   Thought Process  Thought Processes: Disorganized  Descriptions of Associations:Intact  Orientation:Full (Time, Place and Person)  Thought Content: Paranoia at baseline Hallucinations: Denies  Ideas of Reference: Paranoia at baseline Suicidal Thoughts: Denies  Homicidal Thoughts: Denies   Sensorium  Memory: Immediate Fair; Recent Fair;  Remote Fair  Judgment: Impaired  Insight: Shallow   Executive Functions  Concentration: Fair  Attention Span: Fair  Recall: [employer] of Knowledge: Fair  Language: Fair   Psychomotor Activity  Psychomotor Activity: No data recorded  Musculoskeletal: Strength & Muscle Tone: within normal limits Gait & Station: normal  Assets  Assets: Manufacturing systems engineer; Resilience    Physical Exam: Physical Exam Vitals and nursing note reviewed.    ROS Blood pressure 119/63, pulse 62, temperature 98.2 F (36.8 C), resp. rate 18, height 6' (1.829 m), weight 100 kg, SpO2 99%. Body mass index is 29.91 kg/m.  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  Patient needs to be monitored closely for ongoing psychosis and paranoid delusions.  Given patient's confusion and chronic paranoia, no family support, APS report has been made and it got screened him for further evaluation by APS.   Differential diagnosis include: unspecified psychosis, delusion disorder, dementia, schizophrenia/schizoaffective disorder   Treatment Plan Summary: Requested the social work team to reach out to APS for safety concerns in the community as patient has chronic paranoia and confusion and is currently homeless   Safety and Monitoring:             -- InVoluntary admission to inpatient psychiatric unit for safety, stabilization and treatment             -- Daily contact with patient to assess and evaluate symptoms and progress in treatment             -- Patient's case to be discussed in multi-disciplinary team meeting             -- Observation Level: q15 minute checks             -- Vital signs:  q12 hours             -- Precautions: suicide, elopement, and assault   2.  Psychiatric Diagnoses and Treatment:              Prolixin  5 mg p.o. twice daily and IM if he refuses p.o. medication. Reduced zyprexa  10mg  at bedtime only as patient was too sedated  Approved nonemergency forced medication at this time- by 2 physicians  -- The risks/benefits/side-effects/alternatives to this medication were discussed in detail with the patient and time was given for questions. The patient consents to medication trial.                -- Metabolic profile and EKG monitoring obtained while on an atypical antipsychotic (BMI: Lipid Panel: HbgA1c: QTc:)              -- Encouraged patient to participate in unit milieu and in scheduled group therapies                            3. Medical Issues Being Addressed:    No urgent medical needs noted  4. Discharge Planning:   -- Social work and case management to assist with discharge planning and identification of hospital follow-up needs prior to discharge  -- Estimated LOS: 3-4 days  Jenell Dobransky, MD 04/20/2024, 1:00 PM

## 2024-04-20 NOTE — Group Note (Signed)
 Recreation Therapy Group Note   Group Topic:Leisure Education  Group Date: 04/20/2024 Start Time: 1400 End Time: 1500 Facilitators: Celestia Jeoffrey BRAVO, LRT, CTRS Location: Dayroom  Group Description: Positive Affirmation Bingo. LRT and patients played multiple games of Bingo with music playing in the background. LRT and pts discussed what a positive affirmation is, the importance of speaking kindly to yourself, and the use of this as a coping skill.   Goal Area(s) Addressed: Patient will learn positive affirmations.  Patient will engage in recreation activity.  Patient will increase communication.    Affect/Mood: N/A   Participation Level: Did not attend    Clinical Observations/Individualized Feedback: Patient did not attend group.   Plan: Continue to engage patient in RT group sessions 2-3x/week.   [address], LRT, CTRS 04/20/2024 5:29 PM

## 2024-04-21 NOTE — Plan of Care (Signed)
  Problem: Nutrition: Goal: Adequate nutrition will be maintained Outcome: Progressing   Problem: Health Behavior/Discharge Planning: Goal: Compliance with treatment plan for underlying cause of condition will improve 04/21/2024 1222 by Boisvert, Amy T, RN Outcome: Progressing 04/21/2024 1139 by Boisvert, Amy T, RN Outcome: Progressing   Problem: Safety: Goal: Periods of time without injury will increase 04/21/2024 1222 by Boisvert, Amy T, RN Outcome: Progressing 04/21/2024 1139 by Harriette Greig DASEN, RN Outcome: Progressing

## 2024-04-21 NOTE — Plan of Care (Signed)
  Problem: Health Behavior/Discharge Planning: Goal: Ability to manage health-related needs will improve Outcome: Progressing   Problem: Clinical Measurements: Goal: Ability to maintain clinical measurements within normal limits will improve Outcome: Progressing Goal: Will remain free from infection Outcome: Progressing Goal: Cardiovascular complication will be avoided Outcome: Progressing   Problem: Activity: Goal: Risk for activity intolerance will decrease Outcome: Progressing   Problem: Nutrition: Goal: Adequate nutrition will be maintained Outcome: Progressing   Problem: Safety: Goal: Ability to remain free from injury will improve Outcome: Progressing   Problem: Skin Integrity: Goal: Risk for impaired skin integrity will decrease Outcome: Progressing

## 2024-04-21 NOTE — Progress Notes (Signed)
 Mood:  Pleasant and cooperative.   Endorses / Denies: Appears suspicious and paranoid. Endorses AH.  Denies SI/HI.   Medication/ PRNs: Compliant with scheduled medications.  Pain: Denies  Present in milieu / group attendance:  Isolates to room. Minimal interaction with peers.  Did not attend MHT group.  15 min checks in place for safety.

## 2024-04-21 NOTE — Progress Notes (Signed)
(  Sleep Hours) - 7.75 (Any PRNs that were needed, meds refused, or side effects to meds)- none (Any disturbances and when (visitation, over night)- none (Concerns raised by the patient)- pt concerned about discharge; believes that people who put him here are keeping him here. Anxious about his situation; paranoia persists (SI/HI/AVH)- denies

## 2024-04-21 NOTE — Group Note (Signed)
 Date:  04/21/2024 Time:  8:28 PM  Group Topic/Focus:  Healthy Communication:   The focus of this group is to discuss communication, barriers to communication, as well as healthy ways to communicate with others.    Participation Level:  Active  Participation Quality:  Appropriate  Affect:  Appropriate  Cognitive:  Appropriate  Insight: Appropriate  Engagement in Group:  Engaged  Modes of Intervention:  Discussion  Additional Comments:    Romero Earnie Hope 04/21/2024, 8:28 PM

## 2024-04-21 NOTE — Group Note (Signed)
 Date:  04/21/2024 Time:  10:37 AM  Group Topic/Focus:  Meditation Therapy    Participation Level:  Did Not Attend    Benjamin [name] Bias 04/21/2024, 10:37 AM

## 2024-04-21 NOTE — Progress Notes (Signed)
 [facility] MD Progress Note  04/21/2024 6:37 PM Benjamin [name]  MRN:  [id number] Benjamin [name] is a 78 year old male who presents to the inpatient geriatric psych unit after jumping from a two story building. Patient was originally seen at [facility] who referred him to the ED who admitted him here on 03/27/2024. Patient reports that people from the [place] gang were trying to kill him and the only escape was through the bedroom window on the second floor. He currently lives in the house with [name] and his family and has been living with them for 3-4 years without any problems. On 03/19/2024 he went to the doctor and when he got back home the family wouldn't let him leave. He states that after he realized the gang was going to kill him he barricaded the door with his bed and jumped out the window. When he landed he ran away until one of his neighbors found him and called 911.   Subjective: Chart reviewed and discussed with the treatment team.  On interview patient is noted to be sitting on his bed.  When provider encouraged him to go outdoors for recreational therapy he continues to decline.  He remains paranoid and when patient was asked about his safety on the unit he makes remarks that this provider is aware of what is going on and the provider is also playing tricks along with the staff members.  No aggressive behaviors reported.  He is taking the medications and is able to maintain the nutrition.   Sleep: Fair  Appetite:  Fair  Past Psychiatric History: see h&P  Family History:  Family History  Problem Relation Age of Onset   Colon cancer Neg Hx    Colon polyps Neg Hx    Stomach cancer Neg Hx    Esophageal cancer Neg Hx    Social History:  Social History   Substance and Sexual Activity  Alcohol Use No     Social History   Substance and Sexual Activity  Drug Use No    Social History   Socioeconomic History   Marital status: Single    Spouse name: Not on file    Number of children: Not on file   Years of education: Not on file   Highest education level: Not on file  Occupational History   Not on file  Tobacco Use   Smoking status: Former    Types: Cigarettes   Smokeless tobacco: Never  Vaping Use   Vaping status: Never Used  Substance and Sexual Activity   Alcohol use: No   Drug use: No   Sexual activity: Not on file  Other Topics Concern   Not on file  Social History Narrative   Not on file   Social Drivers of Health   Financial Resource Strain: Not on file  Food Insecurity: No Food Insecurity (03/27/2024)   Hunger Vital Sign    Worried About Running Out of Food in the Last Year: Never true    Ran Out of Food in the Last Year: Never true  Transportation Needs: No Transportation Needs (03/27/2024)   PRAPARE - Administrator, Civil Service (Medical): No    Lack of Transportation (Non-Medical): No  Physical Activity: Not on file  Stress: Not on file  Social Connections: Moderately Integrated (03/27/2024)   Social Connection and Isolation Panel    Frequency of Communication with Friends and Family: Twice a week    Frequency of Social Gatherings with Friends and Family:  Once a week    Attends Religious Services: 1 to 4 times per year    Active Member of Clubs or Organizations: No    Attends Banker Meetings: 1 to 4 times per year    Marital Status: Divorced   Past Medical History:  Past Medical History:  Diagnosis Date   Allergy    Arthritis    back   Prostate cancer (HCC) dx'd 2010   surg only    Past Surgical History:  Procedure Laterality Date   COLONOSCOPY     EYE SURGERY     PROSTATE SURGERY      Current Medications: Current Facility-Administered Medications  Medication Dose Route Frequency Provider Last Rate Last Admin   acetaminophen  (TYLENOL ) tablet 650 mg  650 mg Oral Q6H PRN Coleman, Carolyn H, NP       alum & mag hydroxide-simeth (MAALOX/MYLANTA) 200-200-20 MG/5ML suspension 30 mL  30 mL  Oral Q4H PRN Coleman, Carolyn H, NP       docusate sodium  (COLACE) capsule 100 mg  100 mg Oral Daily Jadapalle, Sree, MD   100 mg at 04/21/24 9153   fluPHENAZine  (PROLIXIN ) tablet 5 mg  5 mg Oral BID Jadapalle, Sree, MD   5 mg at 04/21/24 9152   Or   fluPHENAZine  (PROLIXIN ) injection 5 mg  5 mg Intramuscular BID Jadapalle, Sree, MD       magnesium  hydroxide (MILK OF MAGNESIA) suspension 30 mL  30 mL Oral Daily PRN Coleman, Carolyn H, NP       OLANZapine  (ZYPREXA ) injection 5 mg  5 mg Intramuscular TID PRN Coleman, Carolyn H, NP   5 mg at 04/12/24 2230   OLANZapine  zydis (ZYPREXA ) disintegrating tablet 10 mg  10 mg Oral QHS Jadapalle, Sree, MD   10 mg at 04/20/24 2043   OLANZapine  zydis (ZYPREXA ) disintegrating tablet 5 mg  5 mg Oral TID PRN Mardy Elveria DEL, NP        Lab Results:  No results found for this or any previous visit (from the past 48 hours).   Blood Alcohol level:  Lab Results  Component Value Date   [facility] <15 03/26/2024    Metabolic Disorder Labs: Lab Results  Component Value Date   HGBA1C 5.6 04/02/2024   MPG 114 04/02/2024   No results found for: PROLACTIN Lab Results  Component Value Date   CHOL 146 04/02/2024   TRIG 37 04/02/2024   HDL 43 04/02/2024   CHOLHDL 3.4 04/02/2024   VLDL 7 04/02/2024   LDLCALC 96 04/02/2024    Physical Findings: AIMS:  , ,  ,  ,    CIWA:    COWS:      Psychiatric Specialty Exam:  Presentation  General Appearance:  Appropriate for Environment; Bizarre  Eye Contact: Fair  Speech: Clear and Coherent  Speech Volume: Normal    Mood and Affect  Mood: Anxious  Affect: Congruent   Thought Process  Thought Processes: Disorganized  Descriptions of Associations:Intact  Orientation:Full (Time, Place and Person)  Thought Content: Paranoia at baseline Hallucinations: Denies  Ideas of Reference: Paranoia at baseline Suicidal Thoughts: Denies  Homicidal Thoughts: Denies   Sensorium   Memory: Immediate Fair; Recent Fair; Remote Fair  Judgment: Impaired  Insight: Shallow   Executive Functions  Concentration: Fair  Attention Span: Fair  Recall: [employer] of Knowledge: Fair  Language: Fair   Psychomotor Activity  Psychomotor Activity: No data recorded  Musculoskeletal: Strength & Muscle Tone: within normal limits Gait &  Station: normal Assets  Assets: Manufacturing systems engineer; Resilience    Physical Exam: Physical Exam Vitals and nursing note reviewed.    ROS Blood pressure 131/84, pulse 69, temperature 98.2 F (36.8 C), resp. rate 18, height 6' (1.829 m), weight 100 kg, SpO2 99%. Body mass index is 29.91 kg/m.  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  Patient needs to be monitored closely for ongoing psychosis and paranoid delusions.  Given patient's confusion and chronic paranoia, no family support, APS report has been made and it got screened him for further evaluation by APS.   Differential diagnosis include: unspecified psychosis, delusion disorder, dementia, schizophrenia/schizoaffective disorder   Treatment Plan Summary: Requested the social work team to reach out to APS for safety concerns in the community as patient has chronic paranoia and confusion and is currently homeless   Safety and Monitoring:             -- InVoluntary admission to inpatient psychiatric unit for safety, stabilization and treatment             -- Daily contact with patient to assess and evaluate symptoms and progress in treatment             -- Patient's case to be discussed in multi-disciplinary team meeting             -- Observation Level: q15 minute checks             -- Vital signs:  q12 hours             -- Precautions:  suicide, elopement, and assault   2. Psychiatric Diagnoses and Treatment:              Prolixin  5 mg p.o. twice daily and IM if he refuses p.o. medication. Reduced zyprexa  10mg  at bedtime only as patient was too sedated  Approved nonemergency forced medication at this time- by 2 physicians  -- The risks/benefits/side-effects/alternatives to this medication were discussed in detail with the patient and time was given for questions. The patient consents to medication trial.                -- Metabolic profile and EKG monitoring obtained while on an atypical antipsychotic (BMI: Lipid Panel: HbgA1c: QTc:)              -- Encouraged patient to participate in unit milieu and in scheduled group therapies                            3. Medical Issues Being Addressed:    No urgent medical needs noted  4. Discharge Planning:   -- Social work and case management to assist with discharge planning and identification of hospital follow-up needs prior to discharge  -- Estimated LOS: 3-4 days  Millie JONELLE Manners, MD 04/21/2024, 6:37 PM

## 2024-04-21 NOTE — Plan of Care (Signed)
   Problem: Health Behavior/Discharge Planning: Goal: Compliance with treatment plan for underlying cause of condition will improve Outcome: Progressing   Problem: Safety: Goal: Periods of time without injury will increase Outcome: Progressing

## 2024-04-22 MED ORDER — FLUPHENAZINE HCL 2.5 MG/ML IJ SOLN
5.0000 mg | Freq: Two times a day (BID) | INTRAMUSCULAR | Status: DC
  Filled 2024-04-22 (×16): qty 2

## 2024-04-22 MED ORDER — FLUPHENAZINE HCL 5 MG PO TABS
10.0000 mg | ORAL_TABLET | Freq: Two times a day (BID) | ORAL | Status: DC
  Administered 2024-04-22 – 2024-08-30 (×260): 10 mg via ORAL
  Filled 2024-04-22 (×253): qty 2

## 2024-04-22 NOTE — Progress Notes (Signed)
 [facility] MD Progress Note  04/22/2024 1:22 PM Benjamin [name]  MRN:  [id number] Benjamin [name] is a 78 year old male who presents to the inpatient geriatric psych unit after jumping from a two story building. Patient was originally seen at [facility] who referred him to the ED who admitted him here on 03/27/2024. Patient reports that people from the [place] gang were trying to kill him and the only escape was through the bedroom window on the second floor. He currently lives in the house with [name] and his family and has been living with them for 3-4 years without any problems. On 03/19/2024 he went to the doctor and when he got back home the family wouldn't let him leave. He states that after he realized the gang was going to kill him he barricaded the door with his bed and jumped out the window. When he landed he ran away until one of his neighbors found him and called 911.   Subjective: Patient still continues to be paranoid feels that security is not protecting him.  Chart reviewed and discussed with the treatment team.  On interview patient is noted to be sitting on his bed.  At times reports that security here is not safe and continues to be paranoid.  When provider encouraged him to go outdoors for recreational therapy he continues to decline.  He remains paranoid and when patient was asked about his safety on the unit he makes remarks that this provider is aware of what is going on and the provider is also playing tricks along with the staff members.  No aggressive behaviors reported.  He is taking the medications and is able to maintain the nutrition.   Sleep: Fair  Appetite:  Fair  Past Psychiatric History: see h&P  Family History:  Family History  Problem Relation Age of Onset   Colon cancer Neg Hx    Colon polyps Neg Hx    Stomach cancer Neg Hx    Esophageal cancer Neg Hx    Social History:  Social History   Substance and Sexual Activity  Alcohol Use No      Social History   Substance and Sexual Activity  Drug Use No    Social History   Socioeconomic History   Marital status: Single    Spouse name: Not on file   Number of children: Not on file   Years of education: Not on file   Highest education level: Not on file  Occupational History   Not on file  Tobacco Use   Smoking status: Former    Types: Cigarettes   Smokeless tobacco: Never  Vaping Use   Vaping status: Never Used  Substance and Sexual Activity   Alcohol use: No   Drug use: No   Sexual activity: Not on file  Other Topics Concern   Not on file  Social History Narrative   Not on file   Social Drivers of Health   Financial Resource Strain: Not on file  Food Insecurity: No Food Insecurity (03/27/2024)   Hunger Vital Sign    Worried About Running Out of Food in the Last Year: Never true    Ran Out of Food in the Last Year: Never true  Transportation Needs: No Transportation Needs (03/27/2024)   PRAPARE - Administrator, Civil Service (Medical): No    Lack of Transportation (Non-Medical): No  Physical Activity: Not on file  Stress: Not on file  Social Connections: Moderately Integrated (03/27/2024)  Social Advertising account executive    Frequency of Communication with Friends and Family: Twice a week    Frequency of Social Gatherings with Friends and Family: Once a week    Attends Religious Services: 1 to 4 times per year    Active Member of [employer] or Organizations: No    Attends Engineer, structural: 1 to 4 times per year    Marital Status: Divorced   Past Medical History:  Past Medical History:  Diagnosis Date   Allergy    Arthritis    back   Prostate cancer (HCC) dx'd 2010   surg only    Past Surgical History:  Procedure Laterality Date   COLONOSCOPY     EYE SURGERY     PROSTATE SURGERY      Current Medications: Current Facility-Administered Medications  Medication Dose Route Frequency Provider Last Rate Last Admin    acetaminophen  (TYLENOL ) tablet 650 mg  650 mg Oral Q6H PRN Coleman, Carolyn H, NP       alum & mag hydroxide-simeth (MAALOX/MYLANTA) 200-200-20 MG/5ML suspension 30 mL  30 mL Oral Q4H PRN Coleman, Carolyn H, NP       docusate sodium  (COLACE) capsule 100 mg  100 mg Oral Daily Jadapalle, Sree, MD   100 mg at 04/22/24 9080   fluPHENAZine  (PROLIXIN ) tablet 5 mg  5 mg Oral BID Jadapalle, Sree, MD   5 mg at 04/22/24 9080   Or   fluPHENAZine  (PROLIXIN ) injection 5 mg  5 mg Intramuscular BID Jadapalle, Sree, MD       magnesium  hydroxide (MILK OF MAGNESIA) suspension 30 mL  30 mL Oral Daily PRN Mardy Elveria DEL, NP       OLANZapine  (ZYPREXA ) injection 5 mg  5 mg Intramuscular TID PRN Mardy Elveria DEL, NP   5 mg at 04/12/24 2230   OLANZapine  zydis (ZYPREXA ) disintegrating tablet 10 mg  10 mg Oral QHS Jadapalle, Sree, MD   10 mg at 04/21/24 2047   OLANZapine  zydis (ZYPREXA ) disintegrating tablet 5 mg  5 mg Oral TID PRN Mardy Elveria DEL, NP        Lab Results:  No results found for this or any previous visit (from the past 48 hours).   Blood Alcohol level:  Lab Results  Component Value Date   [facility] <15 03/26/2024    Metabolic Disorder Labs: Lab Results  Component Value Date   HGBA1C 5.6 04/02/2024   MPG 114 04/02/2024   No results found for: PROLACTIN Lab Results  Component Value Date   CHOL 146 04/02/2024   TRIG 37 04/02/2024   HDL 43 04/02/2024   CHOLHDL 3.4 04/02/2024   VLDL 7 04/02/2024   LDLCALC 96 04/02/2024    Physical Findings: AIMS:  , ,  ,  ,    CIWA:    COWS:      Psychiatric Specialty Exam:  Presentation  General Appearance:  Appropriate for Environment; Bizarre  Eye Contact: Fair  Speech: Clear and Coherent  Speech Volume: Normal    Mood and Affect  Mood: Anxious  Affect: Congruent   Thought Process  Thought Processes: Disorganized  Descriptions of Associations:Intact  Orientation:Full (Time, Place and Person)  Thought Content:  Paranoia at baseline Hallucinations: Denies  Ideas of Reference: Paranoia at baseline Suicidal Thoughts: Denies  Homicidal Thoughts: Denies   Sensorium  Memory: Immediate Fair; Recent Fair; Remote Fair  Judgment: Impaired  Insight: Shallow   Executive Functions  Concentration: Fair  Attention Span: Fair  Recall:  Fair  [employer] of Knowledge: Fair  Language: Fair   Psychomotor Activity  Psychomotor Activity: No data recorded  Musculoskeletal: Strength & Muscle Tone: within normal limits Gait & Station: normal Assets  Assets: Manufacturing systems engineer; Resilience    Physical Exam: Physical Exam Vitals and nursing note reviewed.    ROS Blood pressure 120/74, pulse 60, temperature 98.2 F (36.8 C), resp. rate 14, height 6' (1.829 m), weight 100 kg, SpO2 98%. Body mass index is 29.91 kg/m.  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  Patient needs to be monitored closely for ongoing psychosis and paranoid delusions.  Given patient's confusion and chronic paranoia, no family support, APS report has been made and it got screened him for further evaluation by APS.   Differential diagnosis include: unspecified psychosis, delusion disorder, dementia, schizophrenia/schizoaffective disorder   Treatment Plan Summary: Requested the social work team to reach out to APS for safety concerns in the community as patient has chronic paranoia and confusion and is currently homeless   Safety and Monitoring:             -- InVoluntary admission to inpatient psychiatric unit for safety, stabilization and treatment             -- Daily contact with patient to assess and evaluate symptoms and progress in treatment             -- Patient's case to be  discussed in multi-disciplinary team meeting             -- Observation Level: q15 minute checks             -- Vital signs:  q12 hours             -- Precautions: suicide, elopement, and assault   2. Psychiatric Diagnoses and Treatment:              Prolixin  5 mg p.o. twice daily and IM if he refuses p.o. medication. Reduced zyprexa  10mg  at bedtime only as patient was too sedated  Approved nonemergency forced medication at this time- by 2 physicians  -- The risks/benefits/side-effects/alternatives to this medication were discussed in detail with the patient and time was given for questions. The patient consents to medication trial.                -- Metabolic profile and EKG monitoring obtained while on an atypical antipsychotic (BMI: Lipid Panel: HbgA1c: QTc:)              -- Encouraged patient to participate in unit milieu and in scheduled group therapies                            3. Medical Issues Being Addressed:    No urgent medical needs noted  4. Discharge Planning:   -- Social work and case management to assist with discharge planning and identification of hospital follow-up needs prior to discharge  -- Estimated LOS: 3-4 days  Millie JONELLE Manners, MD 04/22/2024, 1:22 PM

## 2024-04-22 NOTE — Progress Notes (Signed)
 Pt is fearful and suspicious, hyper-religious, observed talking to himself and hiding behind the wall in his room. Reports feeling tired. Redirected to his bed but refused.Po med compliant. Tol well. Q 15 min checks provided. Denies SI/HI. No c/o pain/discomfort noted.    04/21/24 2100  Psych Admission Type (Psych Patients Only)  Admission Status Involuntary  Psychosocial Assessment  Patient Complaints Suspiciousness;Worrying;Restlessness;Nervousness  Eye Contact Brief  Facial Expression Anxious;Worried  Affect Frightened  Dance movement psychotherapist Activity Slow  Appearance/Hygiene In scrubs  Behavior Characteristics Pacing;Restless;Anxious  Mood Suspicious;Fearful  Thought Process  Coherency Disorganized  Content Paranoia;Delusions  Delusions Paranoid  Perception Hallucinations  Hallucination Auditory  Judgment Impaired  Confusion Mild  Danger to Self  Current suicidal ideation? Denies

## 2024-04-22 NOTE — Progress Notes (Signed)
   04/22/24 1100  Psych Admission Type (Psych Patients Only)  Admission Status Involuntary  Psychosocial Assessment  Patient Complaints Nervousness  Eye Contact Fair  Facial Expression Anxious  Affect Apprehensive  Speech Soft  Interaction Assertive  Motor Activity Slow  Appearance/Hygiene In scrubs  Behavior Characteristics Cooperative  Mood Preoccupied  Thought Process  Coherency Disorganized  Content WDL  Delusions None reported or observed  Perception Hallucinations  Hallucination Auditory  Judgment Impaired  Confusion Mild  Danger to Self  Current suicidal ideation? Denies  Agreement Not to Harm Self Yes  Description of Agreement verbal  Danger to Others  Danger to Others None reported or observed

## 2024-04-22 NOTE — Group Note (Signed)
 Date:  04/22/2024 Time:  11:02 PM  Group Topic/Focus:  Identifying Needs:   The focus of this group is to help patients identify their personal needs that have been historically problematic and identify healthy behaviors to address their needs.    Participation Level:  Appropriate  Participation Quality:  Appropriate  Affect:  Appropriate  Cognitive:  Appropriate  Insight: Appropriate  Engagement in Group:  Engaged  Modes of Intervention:  Education  Additional Comments:    Benjamin [name] 04/22/2024, 11:02 PM

## 2024-04-22 NOTE — Plan of Care (Signed)
   Problem: Education: Goal: Knowledge of General Education information will improve Description Including pain rating scale, medication(s)/side effects and non-pharmacologic comfort measures Outcome: Progressing   Problem: Health Behavior/Discharge Planning: Goal: Ability to manage health-related needs will improve Outcome: Progressing

## 2024-04-22 NOTE — Plan of Care (Signed)
   Problem: Education: Goal: Knowledge of Benjamin [name] General Education information/materials will improve Outcome: Progressing Goal: Emotional status will improve Outcome: Progressing Goal: Mental status will improve Outcome: Progressing Goal: Verbalization of understanding the information provided will improve Outcome: Progressing   Problem: Activity: Goal: Interest or engagement in activities will improve Outcome: Progressing Goal: Sleeping patterns will improve Outcome: Progressing   Problem: Coping: Goal: Ability to verbalize frustrations and anger appropriately will improve Outcome: Progressing Goal: Ability to demonstrate self-control will improve Outcome: Progressing

## 2024-04-22 NOTE — Group Note (Signed)
 Date:  04/22/2024 Time:  12:02 PM  Group Topic/Focus:  Building Self Esteem:   The Focus of this group is helping patients become aware of the effects of self-esteem on their lives, the things they and others do that enhance or undermine their self-esteem, seeing the relationship between their level of self-esteem and the choices they make and learning ways to enhance self-esteem. Goals Group:   The focus of this group is to help patients establish daily goals to achieve during treatment and discuss how the patient can incorporate goal setting into their daily lives to aide in recovery. Making Healthy Choices:   The focus of this group is to help patients identify negative/unhealthy choices they were using prior to admission and identify positive/healthier coping strategies to replace them upon discharge.    Participation Level:  Active  Participation Quality:  Appropriate  Affect:  Appropriate  Cognitive:  Appropriate  Insight: Appropriate  Engagement in Group:  Engaged  Modes of Intervention:  Activity  Additional Comments:    Benjamin [name] B[place]er 04/22/2024, 12:02 PM

## 2024-04-22 NOTE — Progress Notes (Signed)
   04/22/24 0659  15 Minute Checks  Location Bedroom  Visual Appearance Calm  Behavior Sleeping  OTHER  Documented sleep last 24 hours 8  Sleep (Behavioral Health Patients Only)  Calculate sleep? (Click Yes once per 24 hr at 0600 safety check) Yes

## 2024-04-23 DIAGNOSIS — F22 Delusional disorders: Secondary | ICD-10-CM | POA: Diagnosis not present

## 2024-04-23 NOTE — Plan of Care (Signed)
   Problem: Physical Regulation: Goal: Ability to maintain clinical measurements within normal limits will improve Outcome: Progressing   Problem: Safety: Goal: Periods of time without injury will increase Outcome: Progressing

## 2024-04-23 NOTE — BH IP Treatment Plan (Signed)
 Interdisciplinary Treatment and Diagnostic Plan Update  04/23/2024 Time of Session: 3:00 PM  Benjamin [name] MRN: [id number]  Principal Diagnosis: Delusional disorder [facility])  Secondary Diagnoses: Principal Problem:   Delusional disorder (HCC)   Current Medications:  Current Facility-Administered Medications  Medication Dose Route Frequency Provider Last Rate Last Admin   acetaminophen  (TYLENOL ) tablet 650 mg  650 mg Oral Q6H PRN Coleman, Carolyn H, NP       alum & mag hydroxide-simeth (MAALOX/MYLANTA) 200-200-20 MG/5ML suspension 30 mL  30 mL Oral Q4H PRN Coleman, Carolyn H, NP       docusate sodium  (COLACE) capsule 100 mg  100 mg Oral Daily Jadapalle, Sree, MD   100 mg at 04/23/24 9078   fluPHENAZine  (PROLIXIN ) tablet 10 mg  10 mg Oral BID Nazari, Walid A, RPH   10 mg at 04/23/24 9078   Or   fluPHENAZine  (PROLIXIN ) injection 5 mg  5 mg Intramuscular BID Nazari, Walid A, RPH       magnesium  hydroxide (MILK OF MAGNESIA) suspension 30 mL  30 mL Oral Daily PRN Coleman, Carolyn H, NP       OLANZapine  (ZYPREXA ) injection 5 mg  5 mg Intramuscular TID PRN Coleman, Carolyn H, NP   5 mg at 04/12/24 2230   OLANZapine  zydis (ZYPREXA ) disintegrating tablet 10 mg  10 mg Oral QHS Jadapalle, Sree, MD   10 mg at 04/22/24 2113   OLANZapine  zydis (ZYPREXA ) disintegrating tablet 5 mg  5 mg Oral TID PRN Coleman, Carolyn H, NP       PTA Medications: Medications Prior to Admission  Medication Sig Dispense Refill Last Dose/Taking   docusate sodium  (COLACE) 100 MG capsule Take 100 mg by mouth daily.      omega-3 acid ethyl esters (LOVAZA) 1 g capsule Take 1 g by mouth daily.       Patient Stressors: Traumatic event    Patient Strengths: Communication skills   Treatment Modalities: Medication Management, Group therapy, Case management,  1 to 1 session with clinician, Psychoeducation, Recreational therapy.   Physician Treatment Plan for Primary Diagnosis: Delusional disorder [facility]) Long Term Goal(s):  Improvement in symptoms so as ready for discharge   Short Term Goals: Ability to identify changes in lifestyle to reduce recurrence of condition will improve Ability to verbalize feelings will improve Ability to disclose and discuss suicidal ideas Ability to demonstrate self-control will improve Ability to identify and develop effective coping behaviors will improve  Medication Management: Evaluate patient's response, side effects, and tolerance of medication regimen.  Therapeutic Interventions: 1 to 1 sessions, Unit Group sessions and Medication administration.  Evaluation of Outcomes: Not Progressing  Physician Treatment Plan for Secondary Diagnosis: Principal Problem:   Delusional disorder (HCC)  Long Term Goal(s): Improvement in symptoms so as ready for discharge   Short Term Goals: Ability to identify changes in lifestyle to reduce recurrence of condition will improve Ability to verbalize feelings will improve Ability to disclose and discuss suicidal ideas Ability to demonstrate self-control will improve Ability to identify and develop effective coping behaviors will improve     Medication Management: Evaluate patient's response, side effects, and tolerance of medication regimen.  Therapeutic Interventions: 1 to 1 sessions, Unit Group sessions and Medication administration.  Evaluation of Outcomes: Not Progressing   RN Treatment Plan for Primary Diagnosis: Delusional disorder [facility]) Long Term Goal(s): Knowledge of disease and therapeutic regimen to maintain health will improve  Short Term Goals: Ability to remain free from injury will improve, Ability to verbalize frustration and  anger appropriately will improve, Ability to demonstrate self-control, Ability to participate in decision making will improve, Ability to verbalize feelings will improve, Ability to disclose and discuss suicidal ideas, Ability to identify and develop effective coping behaviors will improve, and  Compliance with prescribed medications will improve  Medication Management: RN will administer medications as ordered by provider, will assess and evaluate patient's response and provide education to patient for prescribed medication. RN will report any adverse and/or side effects to prescribing provider.  Therapeutic Interventions: 1 on 1 counseling sessions, Psychoeducation, Medication administration, Evaluate responses to treatment, Monitor vital signs and CBGs as ordered, Perform/monitor CIWA, COWS, AIMS and Fall Risk screenings as ordered, Perform wound care treatments as ordered.  Evaluation of Outcomes: Not Progressing   LCSW Treatment Plan for Primary Diagnosis: Delusional disorder [facility]) Long Term Goal(s): Safe transition to appropriate next level of care at discharge, Engage patient in therapeutic group addressing interpersonal concerns.  Short Term Goals: Engage patient in aftercare planning with referrals and resources, Increase social support, Increase ability to appropriately verbalize feelings, Increase emotional regulation, Facilitate acceptance of mental health diagnosis and concerns, Facilitate patient progression through stages of change regarding substance use diagnoses and concerns, Identify triggers associated with mental health/substance abuse issues, and Increase skills for wellness and recovery  Therapeutic Interventions: Assess for all discharge needs, 1 to 1 time with Social worker, Explore available resources and support systems, Assess for adequacy in community support network, Educate family and significant other(s) on suicide prevention, Complete Psychosocial Assessment, Interpersonal group therapy.  Evaluation of Outcomes: Not Progressing   Progress in Treatment: Attending groups: No Participating in groups: No Taking medication as prescribed: Yes. Toleration medication: Yes. Family/Significant other contact made: Yes, individual(s) contacted:  SPE completed  with Luthor Weathers, pt brother [phone]) on 03/31/2024. Patient understands diagnosis: Yes. Discussing patient identified problems/goals with staff: Yes. Medical problems stabilized or resolved: Yes. Denies suicidal/homicidal ideation: Yes. Issues/concerns per patient self-inventory: No. Other:    New problem(s) identified: No, Describe:  None identified Update 04/02/24: No changes at this time. Update 04/07/24: No changes at this time  Update 04/12/24: No changes at this time Update 04/18/24: No changes at this time Update 04/23/24: No changes at this time        New Short Term/Long Term Goal(s): elimination of symptoms of psychosis, medication management for mood stabilization; elimination of SI thoughts; development of comprehensive mental wellness plan. Update 04/02/24: No changes at this time. Update 04/07/24: No changes at this time. Update 04/12/24: No changes at this time Update 04/18/24: No changes at this time  Update 04/23/24: No changes at this time    Patient Goals:  Get out of here, I want to get healed up so I can get out of here Update 04/02/24: No changes at this time. Update 04/07/24: No changes at this time .Update 04/12/24: No changes at this time Update 04/18/24: No changes at this time  Update 04/23/24: No changes at this time    Discharge Plan or Barriers: CSW will assist with appropriate discharge planning  Update 04/02/24: No changes at this time. Update 04/07/24: No changes at this time.Update 04/12/24: No changes at this time Update 04/18/24: CSW submitted report to APS. Care team looking at guardianship for pt at this time  Update 04/23/24: No changes at this time    Reason for Continuation of Hospitalization: Delusions  Medication stabilization   Estimated Length of Stay: 1 to 7 days Update 04/02/24: TBD. Update 04/07/24: TBD Update 04/12/24:TBD Update 04/18/24:  TBD  Update 04/23/24: TBD    Last 3 [place] Suicide Severity Risk Score: Flowsheet Row Admission (Current) from 03/27/2024 in  [name] from 03/26/2024 in [name] Emergency Department at [facility] ED from 03/25/2024 in [facility]  C-SSRS RISK CATEGORY No Risk No Risk No Risk    Last [facility] 2/9 Scores:     No data to display          Scribe for Treatment Team: [name], [place] 04/23/2024 3:06 PM

## 2024-04-23 NOTE — Group Note (Signed)
 Date:  04/23/2024 Time:  4:06 PM  Group Topic/Focus:  Goals Group:   The focus of this group is to help patients establish daily goals to achieve during treatment and discuss how the patient can incorporate goal setting into their daily lives to aide in recovery.    Participation Level:  Did Not Attend   Benjamin [name] [facility] 04/23/2024, 4:06 PM

## 2024-04-23 NOTE — Progress Notes (Signed)
   04/23/24 0630  15 Minute Checks  Location Bedroom  Visual Appearance Calm  Behavior Sleeping  OTHER  Documented sleep last 24 hours 12  Sleep (Behavioral Health Patients Only)  Calculate sleep? (Click Yes once per 24 hr at 0600 safety check) Yes

## 2024-04-23 NOTE — Plan of Care (Signed)
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
## 2024-04-23 NOTE — Group Note (Signed)
 Recreation Therapy Group Note   Group Topic:Coping Skills  Group Date: 04/23/2024 Start Time: 1400 End Time: 1500 Facilitators: Celestia Jeoffrey BRAVO, LRT, CTRS Location: Courtyard  Group Description: Leisure. Patients were given the option to choose from bowling, corn hole, jenga or [place]. LRT and pts discussed the meaning of leisure, the importance of participating in leisure during their free time/when they're outside of the hospital, as well as how our leisure interests can also serve as coping skills.   Goal Area(s) Addressed:  Patient will identify a current leisure interest.  Patient will learn the definition of "leisure". Patient will practice making a positive decision. Patient will have the opportunity to try a new leisure activity. Patient will communicate with peers and LRT.    Affect/Mood: N/A   Participation Level: Did not attend    Clinical Observations/Individualized Feedback: Patient did not attend group.   Plan: Continue to engage patient in RT group sessions 2-3x/week.   Jeoffrey BRAVO Celestia, LRT, CTRS 04/23/2024 3:57 PM

## 2024-04-23 NOTE — Progress Notes (Signed)
   04/23/24 2300  Psych Admission Type (Psych Patients Only)  Admission Status Involuntary  Psychosocial Assessment  Patient Complaints Isolation;Sleep disturbance  Eye Contact Fair  Facial Expression Anxious  Affect Apprehensive  Speech Soft  Interaction Assertive  Motor Activity Slow  Appearance/Hygiene In scrubs  Behavior Characteristics Cooperative  Mood Suspicious  Thought Process  Coherency Disorganized  Content Paranoia  Delusions None reported or observed  Perception Hallucinations  Hallucination Auditory  Judgment Impaired  Confusion Mild  Danger to Self  Current suicidal ideation? Denies  Agreement Not to Harm Self Yes  Description of Agreement Verbal  Danger to Others  Danger to Others None reported or observed

## 2024-04-23 NOTE — Plan of Care (Signed)
  Problem: Coping: Goal: Level of anxiety will decrease Outcome: Progressing   Problem: Education: Goal: Mental status will improve Outcome: Progressing

## 2024-04-23 NOTE — Group Note (Signed)
 Date:  04/23/2024 Time:  10:10 PM  Group Topic/Focus:  Wellness Toolbox:   The focus of this group is to discuss various aspects of wellness, balancing those aspects and exploring ways to increase the ability to experience wellness.  Patients will create a wellness toolbox for use upon discharge. Wrap-Up Group:   The focus of this group is to help patients review their daily goal of treatment and discuss progress on daily workbooks.    Participation Level:  Active  Participation Quality:  Appropriate  Affect:  Appropriate  Cognitive:  Alert  Insight: Appropriate  Engagement in Group:  Engaged  Modes of Intervention:  Discussion  Additional Comments:    Benjamin [name] 04/23/2024, 10:10 PM

## 2024-04-23 NOTE — Progress Notes (Signed)
   04/22/24 2100  Psych Admission Type (Psych Patients Only)  Admission Status Involuntary  Psychosocial Assessment  Patient Complaints Suspiciousness  Eye Contact Fair  Facial Expression Flat  Affect Apprehensive  Speech Soft  Interaction Assertive  Motor Activity Slow  Appearance/Hygiene In scrubs  Behavior Characteristics Cooperative  Mood Pleasant  Thought Process  Coherency Disorganized  Content Paranoia  Delusions None reported or observed  Perception Hallucinations  Hallucination Auditory  Judgment Impaired  Confusion Mild  Danger to Self  Current suicidal ideation? Denies

## 2024-04-23 NOTE — Progress Notes (Signed)
   04/23/24 0800  Psych Admission Type (Psych Patients Only)  Admission Status Involuntary  Psychosocial Assessment  Patient Complaints Isolation  Eye Contact Fair  Facial Expression Anxious  Affect Apprehensive  Speech Soft  Interaction Assertive  Motor Activity Slow  Appearance/Hygiene In scrubs  Behavior Characteristics Cooperative  Mood Suspicious  Thought Process  Coherency Disorganized  Content Paranoia  Delusions None reported or observed  Perception Hallucinations  Hallucination Auditory  Judgment Impaired  Confusion Mild  Danger to Self  Current suicidal ideation? Denies  Agreement Not to Harm Self Yes  Description of Agreement verbal  Danger to Others  Danger to Others None reported or observed

## 2024-04-24 DIAGNOSIS — F22 Delusional disorders: Secondary | ICD-10-CM | POA: Diagnosis not present

## 2024-04-24 NOTE — Group Note (Signed)
 Recreation Therapy Group Note   Group Topic:Stress Management  Group Date: 04/24/2024 Start Time: 1100 End Time: 1130 Facilitators: Celestia Jeoffrey BRAVO, LRT, CTRS Location: Courtyard  Group Description: Stress Jenga. LRT and pts played games of Jenga. LRT prompted group discussion on the physical signs and symptoms of stress, similar to the feeling when you're playing Jenga and trying to remove a wooden block from the stack without it all collapsing. LRT and pt discussed the physical and mental signs of stress, as well as coping skills to manage them.   Goal Area(s) Addressed: Patient will identify physical symptoms of stress. Patient will identify coping skills for stress. Patient will build frustration tolerance skills.  Patient will increase communication.    Affect/Mood: N/A   Participation Level: Did not attend    Clinical Observations/Individualized Feedback: Patient did not attend group.   Plan: Continue to engage patient in RT group sessions 2-3x/week.   Jeoffrey BRAVO Celestia, LRT, CTRS 04/24/2024 1:39 PM

## 2024-04-24 NOTE — Progress Notes (Signed)
 [facility] MD Progress Note  04/24/2024 8:14 PM Benjamin [name]  MRN:  [id number] Benjamin [name] is a 78 year old male who presents to the inpatient geriatric psych unit after jumping from a two story building. Patient was originally seen at [name] who referred him to the ED who admitted him here on 03/27/2024. Patient reports that people from the [place] gang were trying to kill him and the only escape was through the bedroom window on the second floor. He currently lives in the house with Benjamin [name] and his family and has been living with them for 3-4 years without any problems. On 03/19/2024 he went to the doctor and when he got back home the family wouldn't let him leave. He states that after he realized the gang was going to kill him he barricaded the door with his bed and jumped out the window. When he landed he ran away until one of his neighbors found him and called 911.   Subjective: .  Chart reviewed and discussed with the treatment team.  On interview patient is noted to be resting in bed.  He offers no complaints.  He denies SI/HI/plan and denies hallucinations.  He is taking his medications and denies having any side effects.  Social work team is working with APS for legal guardianship.  Sleep: Fair  Appetite:  Fair  Past Psychiatric History: see h&P  Family History:  Family History  Problem Relation Age of Onset   Colon cancer Neg Hx    Colon polyps Neg Hx    Stomach cancer Neg Hx    Esophageal cancer Neg Hx    Social History:  Social History   Substance and Sexual Activity  Alcohol Use No     Social History   Substance and Sexual Activity  Drug Use No    Social History   Socioeconomic History   Marital status: Single    Spouse name: Not on file   Number of children: Not on file   Years of education: Not on file   Highest education level: Not on file  Occupational History   Not on file  Tobacco Use   Smoking status: Former    Types: Cigarettes    Smokeless tobacco: Never  Vaping Use   Vaping status: Never Used  Substance and Sexual Activity   Alcohol use: No   Drug use: No   Sexual activity: Not on file  Other Topics Concern   Not on file  Social History Narrative   Not on file   Social Drivers of Health   Financial Resource Strain: Not on file  Food Insecurity: No Food Insecurity (03/27/2024)   Hunger Vital Sign    Worried About Running Out of Food in the Last Year: Never true    Ran Out of Food in the Last Year: Never true  Transportation Needs: No Transportation Needs (03/27/2024)   PRAPARE - Administrator, Civil Service (Medical): No    Lack of Transportation (Non-Medical): No  Physical Activity: Not on file  Stress: Not on file  Social Connections: Moderately Integrated (03/27/2024)   Social Connection and Isolation Panel    Frequency of Communication with Friends and Family: Twice a week    Frequency of Social Gatherings with Friends and Family: Once a week    Attends Religious Services: 1 to 4 times per year    Active Member of [employer] or Organizations: No    Attends Banker Meetings: 1 to 4 times per  year    Marital Status: Divorced   Past Medical History:  Past Medical History:  Diagnosis Date   Allergy    Arthritis    back   Prostate cancer (HCC) dx'd 2010   surg only    Past Surgical History:  Procedure Laterality Date   COLONOSCOPY     EYE SURGERY     PROSTATE SURGERY      Current Medications: Current Facility-Administered Medications  Medication Dose Route Frequency Provider Last Rate Last Admin   acetaminophen  (TYLENOL ) tablet 650 mg  650 mg Oral Q6H PRN Coleman, Carolyn H, NP       alum & mag hydroxide-simeth (MAALOX/MYLANTA) 200-200-20 MG/5ML suspension 30 mL  30 mL Oral Q4H PRN Coleman, Carolyn H, NP       docusate sodium  (COLACE) capsule 100 mg  100 mg Oral Daily Travone Georg, MD   100 mg at 04/24/24 0945   fluPHENAZine  (PROLIXIN ) tablet 10 mg  10 mg Oral BID  Nazari, Walid A, RPH   10 mg at 04/24/24 0945   Or   fluPHENAZine  (PROLIXIN ) injection 5 mg  5 mg Intramuscular BID Nazari, Walid A, RPH       magnesium  hydroxide (MILK OF MAGNESIA) suspension 30 mL  30 mL Oral Daily PRN Mardy Elveria DEL, NP       OLANZapine  (ZYPREXA ) injection 5 mg  5 mg Intramuscular TID PRN Coleman, Carolyn H, NP   5 mg at 04/12/24 2230   OLANZapine  zydis (ZYPREXA ) disintegrating tablet 10 mg  10 mg Oral QHS Jadyn Brasher, MD   10 mg at 04/23/24 2128   OLANZapine  zydis (ZYPREXA ) disintegrating tablet 5 mg  5 mg Oral TID PRN Mardy Elveria DEL, NP        Lab Results:  No results found for this or any previous visit (from the past 48 hours).   Blood Alcohol level:  Lab Results  Component Value Date   [facility] <15 03/26/2024    Metabolic Disorder Labs: Lab Results  Component Value Date   HGBA1C 5.6 04/02/2024   MPG 114 04/02/2024   No results found for: PROLACTIN Lab Results  Component Value Date   CHOL 146 04/02/2024   TRIG 37 04/02/2024   HDL 43 04/02/2024   CHOLHDL 3.4 04/02/2024   VLDL 7 04/02/2024   LDLCALC 96 04/02/2024    Physical Findings: AIMS:  , ,  ,  ,    CIWA:    COWS:      Psychiatric Specialty Exam:  Presentation  General Appearance:  Appropriate for Environment; Bizarre  Eye Contact: Fair  Speech: Clear and Coherent  Speech Volume: Normal    Mood and Affect  Mood: Anxious  Affect: Congruent   Thought Process  Thought Processes: Disorganized  Descriptions of Associations:Intact  Orientation:Full (Time, Place and Person)  Thought Content: Paranoia at baseline Hallucinations: Denies  Ideas of Reference: Paranoia at baseline Suicidal Thoughts: Denies  Homicidal Thoughts: Denies   Sensorium  Memory: Immediate Fair; Recent Fair; Remote Fair  Judgment: Impaired  Insight: Shallow   Executive Functions  Concentration: Fair  Attention Span: Fair  Recall: [employer] of  Knowledge: Fair  Language: Fair   Psychomotor Activity  Psychomotor Activity: No data recorded  Musculoskeletal: Strength & Muscle Tone: within normal limits Gait & Station: normal Assets  Assets: Manufacturing systems engineer; Resilience    Physical Exam: Physical Exam Vitals and nursing note reviewed.    ROS Blood pressure 125/63, pulse 67, temperature 100 F (37.8 C), resp.  rate 14, height 6' (1.829 m), weight 100 kg, SpO2 98%. Body mass index is 29.91 kg/m.  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  Patient needs to be monitored closely for ongoing psychosis and paranoid delusions.  Given patient's confusion and chronic paranoia, no family support, APS report has been made and it got screened him for further evaluation by APS.   Differential diagnosis include: unspecified psychosis, delusion disorder, dementia, schizophrenia/schizoaffective disorder   Treatment Plan Summary: Requested the social work team to reach out to APS for safety concerns in the community as patient has chronic paranoia and confusion and is currently homeless   Safety and Monitoring:             -- InVoluntary admission to inpatient psychiatric unit for safety, stabilization and treatment             -- Daily contact with patient to assess and evaluate symptoms and progress in treatment             -- Patient's case to be discussed in multi-disciplinary team meeting             -- Observation Level: q15 minute checks             -- Vital signs:  q12 hours             -- Precautions: suicide, elopement, and assault   2. Psychiatric Diagnoses and Treatment:              Prolixin  5 mg p.o. twice daily and IM if he refuses p.o. medication. Reduced zyprexa  10mg  at bedtime only  as patient was too sedated  Approved nonemergency forced medication at this time- by 2 physicians  -- The risks/benefits/side-effects/alternatives to this medication were discussed in detail with the patient and time was given for questions. The patient consents to medication trial.                -- Metabolic profile and EKG monitoring obtained while on an atypical antipsychotic (BMI: Lipid Panel: HbgA1c: QTc:)              -- Encouraged patient to participate in unit milieu and in scheduled group therapies                            3. Medical Issues Being Addressed:    No urgent medical needs noted  4. Discharge Planning:   -- Social work and case management to assist with discharge planning and identification of hospital follow-up needs prior to discharge  -- Estimated LOS: 3-4 days  Lorrane Mccay, MD 04/24/2024, 8:14 PM

## 2024-04-24 NOTE — Group Note (Signed)
 Recreation Therapy Group Note   Group Topic:Leisure Education  Group Date: 04/24/2024 Start Time: 1500 End Time: 1610 Facilitators: Celestia Jeoffrey BRAVO, LRT, CTRS Location: Courtyard  Group Description: Music. Patients encouraged to name their favorite song(s) for LRT to play song through speaker for group to hear, while in the courtyard getting fresh air and sunlight. Patients educated on the definition of leisure and the importance of having different leisure interests outside of the hospital. Group discussed how leisure activities can often be used as Pharmacologist and that listening to music and being outside are examples.    Goal Area(s) Addressed:  Patient will identify a current leisure interest.  Patient will practice making a positive decision. Patient will have the opportunity to try a new leisure activity.   Affect/Mood: N/A   Participation Level: Did not attend    Clinical Observations/Individualized Feedback: Patient did not attend group.   Plan: Continue to engage patient in RT group sessions 2-3x/week.   Jeoffrey BRAVO Celestia, LRT, CTRS 04/24/2024 4:45 PM

## 2024-04-24 NOTE — Group Note (Signed)
 Date:  04/24/2024 Time:  11:31 PM  Group Topic/Focus:  Wrap-Up Group:   The focus of this group is to help patients review their daily goal of treatment and discuss progress on daily workbooks.    Participation Level:  Did Not Attend  Participation Quality:     Affect:     Cognitive:     Insight: None  Engagement in Group:  None  Modes of Intervention:     Additional Comments:    [name] 04/24/2024, 11:31 PM

## 2024-04-24 NOTE — Plan of Care (Signed)
   Problem: Education: Goal: Knowledge of General Education information will improve Description: Including pain rating scale, medication(s)/side effects and non-pharmacologic comfort measures Outcome: Not Progressing   Problem: Health Behavior/Discharge Planning: Goal: Ability to manage health-related needs will improve Outcome: Not Progressing

## 2024-04-24 NOTE — Progress Notes (Signed)
   04/24/24 1200  Psych Admission Type (Psych Patients Only)  Admission Status Involuntary  Psychosocial Assessment  Patient Complaints Isolation  Eye Contact Fair  Facial Expression Anxious  Affect Apprehensive  Speech Soft  Interaction Assertive  Motor Activity Slow  Appearance/Hygiene In scrubs  Behavior Characteristics Cooperative  Mood Suspicious  Thought Process  Coherency Disorganized  Content Paranoia  Delusions None reported or observed  Perception Hallucinations  Hallucination Auditory  Judgment Impaired  Confusion Mild  Danger to Self  Current suicidal ideation? Denies  Agreement Not to Harm Self Yes  Description of Agreement verbal  Danger to Others  Danger to Others None reported or observed

## 2024-04-25 DIAGNOSIS — F22 Delusional disorders: Secondary | ICD-10-CM | POA: Diagnosis not present

## 2024-04-25 NOTE — Group Note (Signed)
 Date:  04/25/2024 Time:  11:20 AM  Group Topic/Focus:  Personal Choices and Values:   The focus of this group is to help patients assess and explore the importance of values in their lives, how their values affect their decisions, how they express their values and what opposes their expression.    Participation Level:  Did Not Attend   Benjamin [name] 04/25/2024, 11:20 AM

## 2024-04-25 NOTE — Progress Notes (Signed)
 [name] MD Progress Note  04/25/2024 11:13 PM Benjamin [name]  MRN:  [id number] Benjamin [name] is a 78 year old male who presents to the inpatient geriatric psych unit after jumping from a two story building. Patient was originally seen at [facility] who referred him to the ED who admitted him here on 03/27/2024. Patient reports that people from the [place] gang were trying to kill him and the only escape was through the bedroom window on the second floor. He currently lives in the house with Benjamin [name] and his family and has been living with them for 3-4 years without any problems. On 03/19/2024 he went to the doctor and when he got back home the family wouldn't let him leave. He states that after he realized the gang was going to kill him he barricaded the door with his bed and jumped out the window. When he landed he ran away until one of his neighbors found him and called 911.   Subjective: .  Chart reviewed and discussed with the treatment team.  On interview patient is noted to be resting in bed.  He is noted to be much calm today.  He remains paranoid about his stay in the hospital and the staff.  He did acknowledge taking his medications and denies having any side effects.  Patient is encouraged to go out for recreational therapy.  He denies SI/HI/plan denies hallucinations. Sleep: Fair  Appetite:  Fair  Past Psychiatric History: see h&P  Family History:  Family History  Problem Relation Age of Onset   Colon cancer Neg Hx    Colon polyps Neg Hx    Stomach cancer Neg Hx    Esophageal cancer Neg Hx    Social History:  Social History   Substance and Sexual Activity  Alcohol Use No     Social History   Substance and Sexual Activity  Drug Use No    Social History   Socioeconomic History   Marital status: Single    Spouse name: Not on file   Number of children: Not on file   Years of education: Not on file   Highest education level: Not on file  Occupational History    Not on file  Tobacco Use   Smoking status: Former    Types: Cigarettes   Smokeless tobacco: Never  Vaping Use   Vaping status: Never Used  Substance and Sexual Activity   Alcohol use: No   Drug use: No   Sexual activity: Not on file  Other Topics Concern   Not on file  Social History Narrative   Not on file   Social Drivers of Health   Financial Resource Strain: Not on file  Food Insecurity: No Food Insecurity (03/27/2024)   Hunger Vital Sign    Worried About Running Out of Food in the Last Year: Never true    Ran Out of Food in the Last Year: Never true  Transportation Needs: No Transportation Needs (03/27/2024)   PRAPARE - Administrator, Civil Service (Medical): No    Lack of Transportation (Non-Medical): No  Physical Activity: Not on file  Stress: Not on file  Social Connections: Moderately Integrated (03/27/2024)   Social Connection and Isolation Panel    Frequency of Communication with Friends and Family: Twice a week    Frequency of Social Gatherings with Friends and Family: Once a week    Attends Religious Services: 1 to 4 times per year    Active Member of Clubs or  Organizations: No    Attends Engineer, structural: 1 to 4 times per year    Marital Status: Divorced   Past Medical History:  Past Medical History:  Diagnosis Date   Allergy    Arthritis    back   Prostate cancer (HCC) dx'd 2010   surg only    Past Surgical History:  Procedure Laterality Date   COLONOSCOPY     EYE SURGERY     PROSTATE SURGERY      Current Medications: Current Facility-Administered Medications  Medication Dose Route Frequency Provider Last Rate Last Admin   acetaminophen  (TYLENOL ) tablet 650 mg  650 mg Oral Q6H PRN Coleman, Carolyn H, NP       alum & mag hydroxide-simeth (MAALOX/MYLANTA) 200-200-20 MG/5ML suspension 30 mL  30 mL Oral Q4H PRN Coleman, Carolyn H, NP       docusate sodium  (COLACE) capsule 100 mg  100 mg Oral Daily Catalino Plascencia, MD   100 mg  at 04/25/24 9048   fluPHENAZine  (PROLIXIN ) tablet 10 mg  10 mg Oral BID Nazari, Walid A, RPH   10 mg at 04/25/24 2005   Or   fluPHENAZine  (PROLIXIN ) injection 5 mg  5 mg Intramuscular BID Nazari, Walid A, RPH       magnesium  hydroxide (MILK OF MAGNESIA) suspension 30 mL  30 mL Oral Daily PRN Coleman, Carolyn H, NP       OLANZapine  (ZYPREXA ) injection 5 mg  5 mg Intramuscular TID PRN Mardy Elveria DEL, NP   5 mg at 04/12/24 2230   OLANZapine  zydis (ZYPREXA ) disintegrating tablet 10 mg  10 mg Oral QHS Deserai Cansler, MD   10 mg at 04/25/24 2113   OLANZapine  zydis (ZYPREXA ) disintegrating tablet 5 mg  5 mg Oral TID PRN Mardy Elveria DEL, NP        Lab Results:  No results found for this or any previous visit (from the past 48 hours).   Blood Alcohol level:  Lab Results  Component Value Date   [facility] <15 03/26/2024    Metabolic Disorder Labs: Lab Results  Component Value Date   HGBA1C 5.6 04/02/2024   MPG 114 04/02/2024   No results found for: PROLACTIN Lab Results  Component Value Date   CHOL 146 04/02/2024   TRIG 37 04/02/2024   HDL 43 04/02/2024   CHOLHDL 3.4 04/02/2024   VLDL 7 04/02/2024   LDLCALC 96 04/02/2024    Physical Findings: AIMS:  , ,  ,  ,    CIWA:    COWS:      Psychiatric Specialty Exam:  Presentation  General Appearance:  Appropriate for Environment; Bizarre  Eye Contact: Fair  Speech: Clear and Coherent  Speech Volume: Normal    Mood and Affect  Mood: Anxious  Affect: Congruent   Thought Process  Thought Processes: Disorganized  Descriptions of Associations:Intact  Orientation:Full (Time, Place and Person)  Thought Content: Paranoia at baseline Hallucinations: Denies  Ideas of Reference: Paranoia at baseline Suicidal Thoughts: Denies  Homicidal Thoughts: Denies   Sensorium  Memory: Immediate Fair; Recent Fair; Remote Fair  Judgment: Impaired  Insight: Shallow   Executive Functions   Concentration: Fair  Attention Span: Fair  Recall: [employer] of Knowledge: Fair  Language: Fair   Psychomotor Activity  Psychomotor Activity: No data recorded  Musculoskeletal: Strength & Muscle Tone: within normal limits Gait & Station: normal Assets  Assets: Manufacturing systems engineer; Resilience    Physical Exam: Physical Exam Vitals and nursing note reviewed.  ROS Blood pressure 98/63, pulse 80, temperature 98.3 F (36.8 C), resp. rate 18, height 6' (1.829 m), weight 100 kg, SpO2 98%. Body mass index is 29.91 kg/m.  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  Patient needs to be monitored closely for ongoing psychosis and paranoid delusions.  Given patient's confusion and chronic paranoia, no family support, APS report has been made and it got screened in for further evaluation by APS.   Differential diagnosis include: unspecified psychosis, delusion disorder, dementia, schizophrenia/schizoaffective disorder   Treatment Plan Summary: Requested the social work team to reach out to APS for safety concerns in the community as patient has chronic paranoia and confusion and is currently homeless   Safety and Monitoring:             -- InVoluntary admission to inpatient psychiatric unit for safety, stabilization and treatment             -- Daily contact with patient to assess and evaluate symptoms and progress in treatment             -- Patient's case to be discussed in multi-disciplinary team meeting             -- Observation Level: q15 minute checks             -- Vital signs:  q12 hours             -- Precautions: suicide, elopement, and assault   2. Psychiatric Diagnoses and Treatment:              Prolixin  5 mg p.o. twice daily  and IM if he refuses p.o. medication. Reduced zyprexa  10mg  at bedtime only as patient was too sedated  Approved nonemergency forced medication at this time- by 2 physicians  -- The risks/benefits/side-effects/alternatives to this medication were discussed in detail with the patient and time was given for questions. The patient consents to medication trial.                -- Metabolic profile and EKG monitoring obtained while on an atypical antipsychotic (BMI: Lipid Panel: HbgA1c: QTc:)              -- Encouraged patient to participate in unit milieu and in scheduled group therapies                            3. Medical Issues Being Addressed:    No urgent medical needs noted  4. Discharge Planning:   -- Social work and case management to assist with discharge planning and identification of hospital follow-up needs prior to discharge  -- Estimated LOS: 3-4 days  Iya Hamed, MD 04/25/2024, 11:13 PM

## 2024-04-25 NOTE — Plan of Care (Signed)
  Problem: Education: Goal: Knowledge of General Education information will improve Description: Including pain rating scale, medication(s)/side effects and non-pharmacologic comfort measures Outcome: Progressing   Problem: Health Behavior/Discharge Planning: Goal: Ability to manage health-related needs will improve Outcome: Progressing   Problem: Clinical Measurements: Goal: Ability to maintain clinical measurements within normal limits will improve Outcome: Progressing Goal: Will remain free from infection Outcome: Progressing Goal: Diagnostic test results will improve Outcome: Progressing Goal: Respiratory complications will improve Outcome: Progressing Goal: Cardiovascular complication will be avoided Outcome: Progressing   Problem: Activity: Goal: Risk for activity intolerance will decrease Outcome: Progressing   Problem: Nutrition: Goal: Adequate nutrition will be maintained Outcome: Progressing   Problem: Coping: Goal: Level of anxiety will decrease Outcome: Progressing   Problem: Elimination: Goal: Will not experience complications related to bowel motility Outcome: Progressing Goal: Will not experience complications related to urinary retention Outcome: Progressing   Problem: Pain Managment: Goal: General experience of comfort will improve and/or be controlled Outcome: Progressing   Problem: Safety: Goal: Ability to remain free from injury will improve Outcome: Progressing   Problem: Skin Integrity: Goal: Risk for impaired skin integrity will decrease Outcome: Progressing   Problem: Education: Goal: Knowledge of [place] [facility] Education information/materials will improve Outcome: Progressing Goal: Emotional status will improve Outcome: Progressing Goal: Mental status will improve Outcome: Progressing Goal: Verbalization of understanding the information provided will improve Outcome: Progressing   Problem: Activity: Goal: Interest or  engagement in activities will improve Outcome: Progressing Goal: Sleeping patterns will improve Outcome: Progressing   Problem: Coping: Goal: Ability to verbalize frustrations and anger appropriately will improve Outcome: Progressing Goal: Ability to demonstrate self-control will improve Outcome: Progressing   Problem: Health Behavior/Discharge Planning: Goal: Identification of resources available to assist in meeting health care needs will improve Outcome: Progressing Goal: Compliance with treatment plan for underlying cause of condition will improve Outcome: Progressing   Problem: Physical Regulation: Goal: Ability to maintain clinical measurements within normal limits will improve Outcome: Progressing   Problem: Safety: Goal: Periods of time without injury will increase Outcome: Progressing

## 2024-04-25 NOTE — Plan of Care (Signed)

## 2024-04-25 NOTE — Progress Notes (Signed)
   04/25/24 9357  15 Minute Checks  Location Bedroom  Visual Appearance Calm  Behavior Sleeping  OTHER  Documented sleep last 24 hours 14.5  Sleep (Behavioral Health Patients Only)  Calculate sleep? (Click Yes once per 24 hr at 0600 safety check) Yes

## 2024-04-25 NOTE — Progress Notes (Signed)
   04/24/24 2100  Psych Admission Type (Psych Patients Only)  Admission Status Involuntary  Psychosocial Assessment  Patient Complaints Isolation  Eye Contact Fair  Facial Expression Anxious  Affect Apprehensive  Speech Soft  Interaction Assertive  Motor Activity Slow  Appearance/Hygiene In scrubs  Behavior Characteristics Cooperative  Mood Suspicious  Thought Process  Coherency Disorganized  Content Paranoia  Delusions None reported or observed  Perception Hallucinations  Hallucination Auditory  Judgment Impaired  Confusion Mild  Danger to Self  Current suicidal ideation? Denies

## 2024-04-26 DIAGNOSIS — F22 Delusional disorders: Secondary | ICD-10-CM | POA: Diagnosis not present

## 2024-04-26 NOTE — Progress Notes (Signed)
   04/26/24 2000  Psych Admission Type (Psych Patients Only)  Admission Status Involuntary  Psychosocial Assessment  Patient Complaints None  Eye Contact Brief  Facial Expression Animated  Affect Appropriate to circumstance  Speech Soft  Interaction Assertive  Motor Activity Slow  Appearance/Hygiene Unremarkable;In scrubs  Behavior Characteristics Cooperative;Appropriate to situation  Mood Suspicious  Thought Process  Coherency Disorganized  Content Paranoia  Delusions None reported or observed  Perception Hallucinations  Hallucination None reported or observed  Judgment Impaired  Confusion Mild  Danger to Self  Current suicidal ideation? Denies  Danger to Others  Danger to Others None reported or observed

## 2024-04-26 NOTE — Plan of Care (Signed)
  Problem: Activity: Goal: Risk for activity intolerance will decrease 04/26/2024 0641 by Ezzard Stank, RN Outcome: Progressing 04/26/2024 0640 by Ezzard Stank, RN Outcome: Progressing   Problem: Nutrition: Goal: Adequate nutrition will be maintained 04/26/2024 0641 by Ezzard Stank, RN Outcome: Progressing 04/26/2024 0640 by Ezzard Stank, RN Outcome: Progressing   Problem: Coping: Goal: Level of anxiety will decrease Outcome: Progressing   Problem: Pain Managment: Goal: General experience of comfort will improve and/or be controlled Outcome: Progressing   Problem: Safety: Goal: Ability to remain free from injury will improve Outcome: Progressing

## 2024-04-26 NOTE — Group Note (Signed)
 Date:  04/26/2024 Time:  10:56 PM  Group Topic/Focus:  Self Care:   The focus of this group is to help patients understand the importance of self-care in order to improve or restore emotional, physical, spiritual, interpersonal, and financial health.    Participation Level:  Did Not Attend  Participation Quality:  Did Not Attend  Affect:  Did Not Attend  Cognitive:  Did Not Attend  Insight: None  Engagement in Group:  None  Modes of Intervention:  Education  Additional Comments:    Benjamin [name] [name] 04/26/2024, 10:56 PM

## 2024-04-26 NOTE — Group Note (Signed)
 LCSW Group Therapy Note  Group Date: 04/26/2024 Start Time: 1300 End Time: 1345   Type of Therapy and Topic:  Group Therapy: Positive Affirmations  Participation Level:  Minimal   Description of Group:   This group addressed positive affirmation towards self and others.  Patients went around the room and identified two positive things about themselves and two positive things about a peer in the room.  Patients reflected on how it felt to share something positive with others, to identify positive things about themselves, and to hear positive things from others/ Patients were encouraged to have a daily reflection of positive characteristics or circumstances.   Therapeutic Goals: Patients will verbalize two of their positive qualities Patients will demonstrate empathy for others by stating two positive qualities about a peer in the group Patients will verbalize their feelings when voicing positive self affirmations and when voicing positive affirmations of others Patients will discuss the potential positive impact on their wellness/recovery of focusing on positive traits of self and others.  Summary of Patient Progress:  Benjamin [name] actively engaged in the discussion and . He was able to identify positive affirmations about himself as well as other group members. Patient demonstrated fair insight into the subject matter, was respectful of peers, participated throughout the entire session.  Therapeutic Modalities:   Cognitive Behavioral Therapy Motivational Interviewing    Benjamin [name], LCSWA 04/26/2024  2:15 PM

## 2024-04-26 NOTE — Progress Notes (Signed)
 Patient is an involuntary admission to [facility] for paranoid delusions after jumping from 2nd story window believing that people were chasing him and going to kill him.  He has ortho shoes for the both feet with his right foot having a broken bone. Patient was engaging in all 3 group therapies today and was less isolative in his room. Took his medication with no problems today. Can be hard to understand due to talking in a low voice and it takes him awhile to formulate his answers to questions.  Was pleasant with staff and peers today.  Denies SI, HI, AVH, anxiety and depression. Will continue to monitor.

## 2024-04-26 NOTE — Group Note (Signed)
 Recreation Therapy Group Note   Group Topic:Goal Setting  Group Date: 04/26/2024 Start Time: 1510 End Time: 1550 Facilitators: Celestia Jeoffrey BRAVO, LRT, CTRS Location: Dayroom  Group Description: Product/process development scientist. Patients were given many different magazines, a glue stick, markers, and a piece of cardstock paper. LRT and pts discussed the importance of having goals in life. LRT and pts discussed the difference between short-term and long-term goals, as well as what a SMART goal is. LRT encouraged pts to create a vision board, with images they picked and then cut out with safety scissors from the magazine, for themselves, that capture their short and long-term goals. LRT encouraged pts to show and explain their vision board to the group.   Goal Area(s) Addressed:  Patient will gain knowledge of short vs. long term goals.  Patient will identify goals for themselves. Patient will practice setting SMART goals. Patient will verbalize their goals to LRT and peers.   Affect/Mood: Appropriate   Participation Level: Non-verbal    Clinical Observations/Individualized Feedback: Benjamin [name] was present in the dayroom at the time of group. Pt chose to sit at a table away from peers and sat alone. Pt chose not to make a vision board or share a goal, despite encouragement.   Plan: Continue to engage patient in RT group sessions 2-3x/week.   Jeoffrey BRAVO Celestia, LRT, CTRS 04/26/2024 5:21 PM

## 2024-04-26 NOTE — Progress Notes (Signed)
   04/26/24 0657  15 Minute Checks  Location Bedroom  Visual Appearance Calm  Behavior Sleeping  OTHER  Documented sleep last 24 hours 12.75  Sleep (Behavioral Health Patients Only)  Calculate sleep? (Click Yes once per 24 hr at 0600 safety check) Yes

## 2024-04-26 NOTE — Progress Notes (Signed)
 [name] MD Progress Note  04/26/2024 10:39 PM Benjamin [name]  MRN:  [id number] Benjamin [name] is a 78 year old male who presents to the inpatient geriatric psych unit after jumping from a two story building. Patient was originally seen at [facility] who referred him to the ED who admitted him here on 03/27/2024. Patient reports that people from the [place] gang were trying to kill him and the only escape was through the bedroom window on the second floor. He currently lives in the house with [name] and his family and has been living with them for 3-4 years without any problems. On 03/19/2024 he went to the doctor and when he got back home the family wouldn't let him leave. He states that after he realized the gang was going to kill him he barricaded the door with his bed and jumped out the window. When he landed he ran away until one of his neighbors found him and called 911.   Subjective: .  Chart reviewed and discussed with the treatment team.  On interview patient is noted to be resting in bed.  She denies SI/HI/plan.  He denies having any auditory/visual hallucinations.  He continues to endorse not feeling safe at times with some of the staff members on the unit.  Per staff members his mental status has improved, came out of his room for lunch and breakfast, participated in some groups and came outdoors for recreational groups.    Sleep: Fair  Appetite:  Fair  Past Psychiatric History: see h&P  Family History:  Family History  Problem Relation Age of Onset   Colon cancer Neg Hx    Colon polyps Neg Hx    Stomach cancer Neg Hx    Esophageal cancer Neg Hx    Social History:  Social History   Substance and Sexual Activity  Alcohol Use No     Social History   Substance and Sexual Activity  Drug Use No    Social History   Socioeconomic History   Marital status: Single    Spouse name: Not on file   Number of children: Not on file   Years of education: Not on file    Highest education level: Not on file  Occupational History   Not on file  Tobacco Use   Smoking status: Former    Types: Cigarettes   Smokeless tobacco: Never  Vaping Use   Vaping status: Never Used  Substance and Sexual Activity   Alcohol use: No   Drug use: No   Sexual activity: Not on file  Other Topics Concern   Not on file  Social History Narrative   Not on file   Social Drivers of Health   Financial Resource Strain: Not on file  Food Insecurity: No Food Insecurity (03/27/2024)   Hunger Vital Sign    Worried About Running Out of Food in the Last Year: Never true    Ran Out of Food in the Last Year: Never true  Transportation Needs: No Transportation Needs (03/27/2024)   PRAPARE - Administrator, Civil Service (Medical): No    Lack of Transportation (Non-Medical): No  Physical Activity: Not on file  Stress: Not on file  Social Connections: Moderately Integrated (03/27/2024)   Social Connection and Isolation Panel    Frequency of Communication with Friends and Family: Twice a week    Frequency of Social Gatherings with Friends and Family: Once a week    Attends Religious Services: 1 to 4 times  per year    Active Member of Clubs or Organizations: No    Attends Banker Meetings: 1 to 4 times per year    Marital Status: Divorced   Past Medical History:  Past Medical History:  Diagnosis Date   Allergy    Arthritis    back   Prostate cancer (HCC) dx'd 2010   surg only    Past Surgical History:  Procedure Laterality Date   COLONOSCOPY     EYE SURGERY     PROSTATE SURGERY      Current Medications: Current Facility-Administered Medications  Medication Dose Route Frequency Provider Last Rate Last Admin   acetaminophen  (TYLENOL ) tablet 650 mg  650 mg Oral Q6H PRN Coleman, Carolyn H, NP       alum & mag hydroxide-simeth (MAALOX/MYLANTA) 200-200-20 MG/5ML suspension 30 mL  30 mL Oral Q4H PRN Coleman, Carolyn H, NP       docusate sodium  (COLACE)  capsule 100 mg  100 mg Oral Daily Virgilia Quigg, MD   100 mg at 04/26/24 0802   fluPHENAZine  (PROLIXIN ) tablet 10 mg  10 mg Oral BID Nazari, Walid A, RPH   10 mg at 04/26/24 2111   Or   fluPHENAZine  (PROLIXIN ) injection 5 mg  5 mg Intramuscular BID Nazari, Walid A, RPH       magnesium  hydroxide (MILK OF MAGNESIA) suspension 30 mL  30 mL Oral Daily PRN Mardy Elveria DEL, NP       OLANZapine  (ZYPREXA ) injection 5 mg  5 mg Intramuscular TID PRN Coleman, Carolyn H, NP   5 mg at 04/12/24 2230   OLANZapine  zydis (ZYPREXA ) disintegrating tablet 10 mg  10 mg Oral QHS Dreux Mcgroarty, MD   10 mg at 04/26/24 2111   OLANZapine  zydis (ZYPREXA ) disintegrating tablet 5 mg  5 mg Oral TID PRN Mardy Elveria DEL, NP        Lab Results:  No results found for this or any previous visit (from the past 48 hours).   Blood Alcohol level:  Lab Results  Component Value Date   [facility] <15 03/26/2024    Metabolic Disorder Labs: Lab Results  Component Value Date   HGBA1C 5.6 04/02/2024   MPG 114 04/02/2024   No results found for: PROLACTIN Lab Results  Component Value Date   CHOL 146 04/02/2024   TRIG 37 04/02/2024   HDL 43 04/02/2024   CHOLHDL 3.4 04/02/2024   VLDL 7 04/02/2024   LDLCALC 96 04/02/2024    Physical Findings: AIMS:  , ,  ,  ,    CIWA:    COWS:      Psychiatric Specialty Exam:  Presentation  General Appearance:  Appropriate for Environment; Bizarre  Eye Contact: Fair  Speech: Clear and Coherent  Speech Volume: Normal    Mood and Affect  Mood: Anxious  Affect: Congruent   Thought Process  Thought Processes: Disorganized  Descriptions of Associations:Intact  Orientation:Full (Time, Place and Person)  Thought Content: Paranoia at baseline Hallucinations: Denies  Ideas of Reference: Paranoia at baseline Suicidal Thoughts: Denies  Homicidal Thoughts: Denies   Sensorium  Memory: Immediate Fair; Recent Fair; Remote  Fair  Judgment: Impaired  Insight: Shallow   Executive Functions  Concentration: Fair  Attention Span: Fair  Recall: [employer] of Knowledge: Fair  Language: Fair   Psychomotor Activity  Psychomotor Activity: No data recorded  Musculoskeletal: Strength & Muscle Tone: within normal limits Gait & Station: normal Assets  Assets: Manufacturing systems engineer; Resilience  Physical Exam: Physical Exam Vitals and nursing note reviewed.    ROS Blood pressure 106/71, pulse 75, temperature 98.2 F (36.8 C), resp. rate 17, height 6' (1.829 m), weight 100 kg, SpO2 99%. Body mass index is 29.91 kg/m.  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  Patient needs to be monitored closely for ongoing psychosis and paranoid delusions.  Given patient's confusion and chronic paranoia, no family support, APS report has been made and it got screened in for further evaluation by APS.   Differential diagnosis include: unspecified psychosis, delusion disorder, dementia, schizophrenia/schizoaffective disorder   Treatment Plan Summary: Requested the social work team to reach out to APS for safety concerns in the community as patient has chronic paranoia and confusion and is currently homeless   Safety and Monitoring:             -- InVoluntary admission to inpatient psychiatric unit for safety, stabilization and treatment             -- Daily contact with patient to assess and evaluate symptoms and progress in treatment             -- Patient's case to be discussed in multi-disciplinary team meeting             -- Observation Level: q15 minute checks             -- Vital signs:  q12 hours             -- Precautions: suicide, elopement, and assault   2.  Psychiatric Diagnoses and Treatment:              Prolixin  5 mg p.o. twice daily and IM if he refuses p.o. medication. Reduced zyprexa  10mg  at bedtime only as patient was too sedated  Approved nonemergency forced medication at this time- by 2 physicians  -- The risks/benefits/side-effects/alternatives to this medication were discussed in detail with the patient and time was given for questions. The patient consents to medication trial.                -- Metabolic profile and EKG monitoring obtained while on an atypical antipsychotic (BMI: Lipid Panel: HbgA1c: QTc:)              -- Encouraged patient to participate in unit milieu and in scheduled group therapies                            3. Medical Issues Being Addressed:    No urgent medical needs noted  4. Discharge Planning:   -- Social work and case management to assist with discharge planning and identification of hospital follow-up needs prior to discharge  -- Estimated LOS: 3-4 days  Allyn Foil, MD 04/26/2024, 10:39 PM

## 2024-04-26 NOTE — Group Note (Signed)
 Date:  04/26/2024 Time:  11:17 AM  Group Topic/Focus:  Fresh air Therapy with music and cards    Participation Level:  Active  Participation Quality:  Appropriate  Affect:  Appropriate  Cognitive:  Appropriate  Insight: Appropriate  Engagement in Group:  Engaged  Modes of Intervention:  Activity and Socialization  Additional Comments:  none  [name] 04/26/2024, 11:17 AM

## 2024-04-26 NOTE — Group Note (Signed)
 Date:  04/26/2024 Time:  5:17 AM  Group Topic/Focus:  Dimensions of Wellness:   The focus of this group is to introduce the topic of wellness and discuss the role each dimension of wellness plays in total health. Wrap-Up Group:   The focus of this group is to help patients review their daily goal of treatment and discuss progress on daily workbooks. Video Meditation: The focus of this group was to introduce meditation for beginners and demonstrate how to surround their day with positive thinking leading with daily affirmations.    Participation Level:  Active  Participation Quality:  Sharing  Affect:  Appropriate  Cognitive:  Appropriate  Insight: Good  Engagement in Group:  Engaged  Modes of Intervention:  Discussion and Education  Additional Comments:    Kristen VEAR Gibbon 04/26/2024, 5:17 AM

## 2024-04-26 NOTE — Progress Notes (Signed)
 Alert and oriented. Pt remains paranoid and suspicious. Affect is flat. Patient is visible in the dayroom with limited interaction with peers and staff. Attends group. Took po medications as scheduled. Tol well. No behavior issues noted. Denies SI/HI/AVH. Q 15 min checks maintained for safety. No c/o pain/discomfort noted.     04/25/24 2200  Psych Admission Type (Psych Patients Only)  Admission Status Involuntary  Psychosocial Assessment  Patient Complaints Isolation  Eye Contact Fair  Facial Expression Anxious  Affect Apprehensive  Speech Soft  Interaction Assertive  Motor Activity Slow  Appearance/Hygiene In scrubs  Behavior Characteristics Cooperative  Mood Suspicious  Thought Process  Coherency Disorganized  Content Paranoia  Delusions None reported or observed  Perception Hallucinations  Hallucination Auditory  Judgment Impaired  Confusion Mild  Danger to Self  Current suicidal ideation? Denies

## 2024-04-27 DIAGNOSIS — F22 Delusional disorders: Secondary | ICD-10-CM | POA: Diagnosis not present

## 2024-04-27 NOTE — Plan of Care (Signed)
  Problem: Health Behavior/Discharge Planning: Goal: Ability to manage health-related needs will improve Outcome: Progressing   Problem: Clinical Measurements: Goal: Ability to maintain clinical measurements within normal limits will improve Outcome: Progressing Goal: Will remain free from infection Outcome: Progressing Goal: Diagnostic test results will improve Outcome: Progressing   Problem: Activity: Goal: Risk for activity intolerance will decrease Outcome: Progressing   Problem: Nutrition: Goal: Adequate nutrition will be maintained Outcome: Progressing   Problem: Coping: Goal: Level of anxiety will decrease Outcome: Progressing   Problem: Skin Integrity: Goal: Risk for impaired skin integrity will decrease Outcome: Progressing

## 2024-04-27 NOTE — Progress Notes (Signed)
 Patient was cooperative with treatment and compliant with medications on the shift, he denies SI, HI & AVH. No new issues to report on the shift at this time.

## 2024-04-27 NOTE — Group Note (Signed)
 Physical/Occupational Therapy Group Note  Group Topic: UE Therex   Group Date: 04/27/2024 Start Time: 1310 End Time: 1350 Facilitators: Clive Warren CROME, OT   Group Description: Group instructed in series of upper extremities exercises, aimed to promote strength, flexibility, range of motion and functional endurance.  Patients provided cuing for proper mechanics and proper pace of exercise; exercises adjusted as necessary for individualized patient needs.  Patient also engaged in cognitive components throughout session, working to integrate attention to task, command following, turn-taking and appropriate social interaction throughout session.  Allowed to ask questions as appropriate, and encouraged to identify specific exercises that they could complete independently outside of group sessions.  Therapeutic Goal(s):  Demonstrate appropriate performance of upper extremity exercises to promote strength, flexibility, range of motion and functional endurance Identify 2-3 specific upper extremity exercises to complete as home exercise program outside of group session  Individual Participation: Pt did not attend.  Participation Level: Did not attend   Participation Quality:   Behavior:   Speech/Thought Process:   Affect/Mood:   Insight:   Judgement:   Modes of Intervention:   Patient Response to Interventions:    Plan: Continue to engage patient in PT/OT groups 1 - 2x/week.  Nabiha Planck R., MPH, MS, OTR/L ascom [phone] 04/27/24, 3:08 PM

## 2024-04-27 NOTE — Group Note (Signed)
 Date:  04/27/2024 Time:  12:09 PM  Group Topic/Focus:  Fresh air therapy with music and cards    Participation Level:  Did Not Attend    [name] 04/27/2024, 12:09 PM

## 2024-04-27 NOTE — Progress Notes (Signed)
 [facility] MD Progress Note  04/27/2024 9:31 PM Benjamin [name]  MRN:  [id number] Benjamin [name] is a 78 year old male who presents to the inpatient geriatric psych unit after jumping from a two story building. Patient was originally seen at [name] who referred him to the ED who admitted him here on 03/27/2024. Patient reports that people from the [place] gang were trying to kill him and the only escape was through the bedroom window on the second floor. He currently lives in the house with [name] and his family and has been living with them for 3-4 years without any problems. On 03/19/2024 he went to the doctor and when he got back home the family wouldn't let him leave. He states that after he realized the gang was going to kill him he barricaded the door with his bed and jumped out the window. When he landed he ran away until one of his neighbors found him and called 911.   Subjective: .  Chart reviewed and discussed with the treatment team.  Patient is noted to be sitting in his room.  He is much calmer and cooperative during the interview.  He denies auditory/visual hallucinations.  He denies SI/HI/plan.  He reports having fair appetite and sleep.   Sleep: Fair  Appetite:  Fair  Past Psychiatric History: see h&P  Family History:  Family History  Problem Relation Age of Onset   Colon cancer Neg Hx    Colon polyps Neg Hx    Stomach cancer Neg Hx    Esophageal cancer Neg Hx    Social History:  Social History   Substance and Sexual Activity  Alcohol Use No     Social History   Substance and Sexual Activity  Drug Use No    Social History   Socioeconomic History   Marital status: Single    Spouse name: Not on file   Number of children: Not on file   Years of education: Not on file   Highest education level: Not on file  Occupational History   Not on file  Tobacco Use   Smoking status: Former    Types: Cigarettes   Smokeless tobacco: Never  Vaping Use    Vaping status: Never Used  Substance and Sexual Activity   Alcohol use: No   Drug use: No   Sexual activity: Not on file  Other Topics Concern   Not on file  Social History Narrative   Not on file   Social Drivers of Health   Financial Resource Strain: Not on file  Food Insecurity: No Food Insecurity (03/27/2024)   Hunger Vital Sign    Worried About Running Out of Food in the Last Year: Never true    Ran Out of Food in the Last Year: Never true  Transportation Needs: No Transportation Needs (03/27/2024)   PRAPARE - Administrator, Civil Service (Medical): No    Lack of Transportation (Non-Medical): No  Physical Activity: Not on file  Stress: Not on file  Social Connections: Moderately Integrated (03/27/2024)   Social Connection and Isolation Panel    Frequency of Communication with Friends and Family: Twice a week    Frequency of Social Gatherings with Friends and Family: Once a week    Attends Religious Services: 1 to 4 times per year    Active Member of [employer] or Organizations: No    Attends Banker Meetings: 1 to 4 times per year    Marital Status: Divorced  Past Medical History:  Past Medical History:  Diagnosis Date   Allergy    Arthritis    back   Prostate cancer (HCC) dx'd 2010   surg only    Past Surgical History:  Procedure Laterality Date   COLONOSCOPY     EYE SURGERY     PROSTATE SURGERY      Current Medications: Current Facility-Administered Medications  Medication Dose Route Frequency Provider Last Rate Last Admin   acetaminophen  (TYLENOL ) tablet 650 mg  650 mg Oral Q6H PRN Coleman, Carolyn H, NP       alum & mag hydroxide-simeth (MAALOX/MYLANTA) 200-200-20 MG/5ML suspension 30 mL  30 mL Oral Q4H PRN Coleman, Carolyn H, NP       docusate sodium  (COLACE) capsule 100 mg  100 mg Oral Daily Makenzi Bannister, MD   100 mg at 04/27/24 0841   fluPHENAZine  (PROLIXIN ) tablet 10 mg  10 mg Oral BID Nazari, Walid A, RPH   10 mg at 04/27/24 2032    Or   fluPHENAZine  (PROLIXIN ) injection 5 mg  5 mg Intramuscular BID Nazari, Walid A, RPH       magnesium  hydroxide (MILK OF MAGNESIA) suspension 30 mL  30 mL Oral Daily PRN Mardy Elveria DEL, NP       OLANZapine  (ZYPREXA ) injection 5 mg  5 mg Intramuscular TID PRN Coleman, Carolyn H, NP   5 mg at 04/12/24 2230   OLANZapine  zydis (ZYPREXA ) disintegrating tablet 10 mg  10 mg Oral QHS Johnattan Strassman, MD   10 mg at 04/27/24 2121   OLANZapine  zydis (ZYPREXA ) disintegrating tablet 5 mg  5 mg Oral TID PRN Mardy Elveria DEL, NP        Lab Results:  No results found for this or any previous visit (from the past 48 hours).   Blood Alcohol level:  Lab Results  Component Value Date   [facility] <15 03/26/2024    Metabolic Disorder Labs: Lab Results  Component Value Date   HGBA1C 5.6 04/02/2024   MPG 114 04/02/2024   No results found for: PROLACTIN Lab Results  Component Value Date   CHOL 146 04/02/2024   TRIG 37 04/02/2024   HDL 43 04/02/2024   CHOLHDL 3.4 04/02/2024   VLDL 7 04/02/2024   LDLCALC 96 04/02/2024    Physical Findings: AIMS:  , ,  ,  ,    CIWA:    COWS:      Psychiatric Specialty Exam:  Presentation  General Appearance:  Appropriate for Environment; Bizarre  Eye Contact: Fair  Speech: Clear and Coherent  Speech Volume: Normal    Mood and Affect  Mood: Anxious  Affect: Congruent   Thought Process  Thought Processes: Disorganized  Descriptions of Associations:Intact  Orientation:Full (Time, Place and Person)  Thought Content: Paranoia at baseline Hallucinations: Denies  Ideas of Reference: Paranoia at baseline Suicidal Thoughts: Denies  Homicidal Thoughts: Denies   Sensorium  Memory: Immediate Fair; Recent Fair; Remote Fair  Judgment: Impaired  Insight: Shallow   Executive Functions  Concentration: Fair  Attention Span: Fair  Recall: [employer] of Knowledge: Fair  Language: Fair   Psychomotor Activity   Psychomotor Activity: No data recorded  Musculoskeletal: Strength & Muscle Tone: within normal limits Gait & Station: normal Assets  Assets: Manufacturing systems engineer; Resilience    Physical Exam: Physical Exam Vitals and nursing note reviewed.    ROS Blood pressure 114/77, pulse 80, temperature (!) 97.5 F (36.4 C), resp. rate 18, height 6' (1.829 m), weight 100  kg, SpO2 100%. Body mass index is 29.91 kg/m.  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  Patient needs to be monitored closely for ongoing psychosis and paranoid delusions.  Given patient's confusion and chronic paranoia, no family support, APS report has been made and it got screened in for further evaluation by APS.   Differential diagnosis include: unspecified psychosis, delusion disorder, dementia, schizophrenia/schizoaffective disorder   Treatment Plan Summary: Requested the social work team to reach out to APS for safety concerns in the community as patient has chronic paranoia and confusion and is currently homeless   Safety and Monitoring:             -- InVoluntary admission to inpatient psychiatric unit for safety, stabilization and treatment             -- Daily contact with patient to assess and evaluate symptoms and progress in treatment             -- Patient's case to be discussed in multi-disciplinary team meeting             -- Observation Level: q15 minute checks             -- Vital signs:  q12 hours             -- Precautions: suicide, elopement, and assault   2. Psychiatric Diagnoses and Treatment:              Prolixin  5 mg p.o. twice daily and IM if he refuses p.o. medication. Reduced zyprexa  10mg  at bedtime only as patient was too sedated  Approved nonemergency forced  medication at this time- by 2 physicians  -- The risks/benefits/side-effects/alternatives to this medication were discussed in detail with the patient and time was given for questions. The patient consents to medication trial.                -- Metabolic profile and EKG monitoring obtained while on an atypical antipsychotic (BMI: Lipid Panel: HbgA1c: QTc:)              -- Encouraged patient to participate in unit milieu and in scheduled group therapies                            3. Medical Issues Being Addressed:    No urgent medical needs noted  4. Discharge Planning:   -- Social work and case management to assist with discharge planning and identification of hospital follow-up needs prior to discharge  -- Estimated LOS: 3-4 days  Rakeisha Nyce, MD 04/27/2024, 9:31 PM

## 2024-04-27 NOTE — Progress Notes (Signed)
(  Sleep Hours) - 8 (Any PRNs that were needed, meds refused, or side effects to meds)- none (Any disturbances and when (visitation, over night)- none (Concerns raised by the patient)- none (SI/HI/AVH)- denies

## 2024-04-27 NOTE — Group Note (Signed)
 Recreation Therapy Group Note   Group Topic:Stress Management  Group Date: 04/27/2024 Start Time: 1500 End Time: 1600 Facilitators: Celestia Jeoffrey BRAVO, LRT, CTRS Location: Courtyard  Group Description: Stress Jenga. LRT and pts played games of Jenga. LRT prompted group discussion on the physical signs and symptoms of stress, similar to the feeling when you're playing Jenga and trying to remove a wooden block from the stack without it all collapsing. LRT and pt discussed the physical and mental signs of stress, as well as coping skills to manage them.   Goal Area(s) Addressed: Patient will identify physical symptoms of stress. Patient will identify coping skills for stress. Patient will build frustration tolerance skills.  Patient will increase communication.    Affect/Mood: Appropriate   Participation Level: Minimal    Clinical Observations/Individualized Feedback: Dareld was present for the last 10 minutes.   Plan: Continue to engage patient in RT group sessions 2-3x/week.   Jeoffrey BRAVO Celestia, LRT, CTRS 04/27/2024 4:43 PM

## 2024-04-27 NOTE — Plan of Care (Signed)
   Problem: Coping: Goal: Level of anxiety will decrease Outcome: Progressing

## 2024-04-28 DIAGNOSIS — F22 Delusional disorders: Secondary | ICD-10-CM | POA: Diagnosis not present

## 2024-04-28 NOTE — Progress Notes (Addendum)
 [facility] MD Progress Note  04/28/2024 4:42 PM Benjamin [name]  MRN:  [id number] Benjamin [name] is a 78 year old male who presents to the inpatient geriatric psych unit after jumping from a two story building. Patient was originally seen at [facility] who referred him to the ED who admitted him here on 03/27/2024. Patient reports that people from the [place] gang were trying to kill him and the only escape was through the bedroom window on the second floor. He currently lives in the house with [name] and his family and has been living with them for 3-4 years without any problems. On 03/19/2024 he went to the doctor and when he got back home the family wouldn't let him leave. He states that after he realized the gang was going to kill him he barricaded the door with his bed and jumped out the window. When he landed he ran away until one of his neighbors found him and called 911.   Subjective:   Patient was seen again today.  Patient now is coming out of the room, going to the groups, engaging well.  Patient on interview denies that he is not being poisoned by anyone.  Patient denies any current auditory and visual hallucinations.  Denies any suicide homicidal ideations.  Reportedly he is eating well and sleeping well.    Sleep: Fair  Appetite:  Fair  Past Psychiatric History: see h&P  Family History:  Family History  Problem Relation Age of Onset   Colon cancer Neg Hx    Colon polyps Neg Hx    Stomach cancer Neg Hx    Esophageal cancer Neg Hx    Social History:  Social History   Substance and Sexual Activity  Alcohol Use No     Social History   Substance and Sexual Activity  Drug Use No    Social History   Socioeconomic History   Marital status: Single    Spouse name: Not on file   Number of children: Not on file   Years of education: Not on file   Highest education level: Not on file  Occupational History   Not on file  Tobacco Use   Smoking status: Former     Types: Cigarettes   Smokeless tobacco: Never  Vaping Use   Vaping status: Never Used  Substance and Sexual Activity   Alcohol use: No   Drug use: No   Sexual activity: Not on file  Other Topics Concern   Not on file  Social History Narrative   Not on file   Social Drivers of Health   Financial Resource Strain: Not on file  Food Insecurity: No Food Insecurity (03/27/2024)   Hunger Vital Sign    Worried About Running Out of Food in the Last Year: Never true    Ran Out of Food in the Last Year: Never true  Transportation Needs: No Transportation Needs (03/27/2024)   PRAPARE - Administrator, Civil Service (Medical): No    Lack of Transportation (Non-Medical): No  Physical Activity: Not on file  Stress: Not on file  Social Connections: Moderately Integrated (03/27/2024)   Social Connection and Isolation Panel    Frequency of Communication with Friends and Family: Twice a week    Frequency of Social Gatherings with Friends and Family: Once a week    Attends Religious Services: 1 to 4 times per year    Active Member of [employer] or Organizations: No    Attends Banker Meetings:  1 to 4 times per year    Marital Status: Divorced   Past Medical History:  Past Medical History:  Diagnosis Date   Allergy    Arthritis    back   Prostate cancer (HCC) dx'd 2010   surg only    Past Surgical History:  Procedure Laterality Date   COLONOSCOPY     EYE SURGERY     PROSTATE SURGERY      Current Medications: Current Facility-Administered Medications  Medication Dose Route Frequency Provider Last Rate Last Admin   acetaminophen  (TYLENOL ) tablet 650 mg  650 mg Oral Q6H PRN Coleman, Carolyn H, NP       alum & mag hydroxide-simeth (MAALOX/MYLANTA) 200-200-20 MG/5ML suspension 30 mL  30 mL Oral Q4H PRN Coleman, Carolyn H, NP       docusate sodium  (COLACE) capsule 100 mg  100 mg Oral Daily Jadapalle, Sree, MD   100 mg at 04/28/24 0800   fluPHENAZine  (PROLIXIN ) tablet 10 mg   10 mg Oral BID Nazari, Walid A, RPH   10 mg at 04/28/24 0800   Or   fluPHENAZine  (PROLIXIN ) injection 5 mg  5 mg Intramuscular BID Nazari, Walid A, RPH       magnesium  hydroxide (MILK OF MAGNESIA) suspension 30 mL  30 mL Oral Daily PRN Mardy Elveria DEL, NP       OLANZapine  (ZYPREXA ) injection 5 mg  5 mg Intramuscular TID PRN Mardy Elveria DEL, NP   5 mg at 04/12/24 2230   OLANZapine  zydis (ZYPREXA ) disintegrating tablet 10 mg  10 mg Oral QHS Jadapalle, Sree, MD   10 mg at 04/27/24 2121   OLANZapine  zydis (ZYPREXA ) disintegrating tablet 5 mg  5 mg Oral TID PRN Mardy Elveria DEL, NP        Lab Results:  No results found for this or any previous visit (from the past 48 hours).   Blood Alcohol level:  Lab Results  Component Value Date   [facility] <15 03/26/2024    Metabolic Disorder Labs: Lab Results  Component Value Date   HGBA1C 5.6 04/02/2024   MPG 114 04/02/2024   No results found for: PROLACTIN Lab Results  Component Value Date   CHOL 146 04/02/2024   TRIG 37 04/02/2024   HDL 43 04/02/2024   CHOLHDL 3.4 04/02/2024   VLDL 7 04/02/2024   LDLCALC 96 04/02/2024    Physical Findings: AIMS:  , ,  ,  ,    CIWA:    COWS:      Psychiatric Specialty Exam:  Presentation  General Appearance:  Appropriate for Environment; Bizarre  Eye Contact: Fair  Speech: Clear and Coherent  Speech Volume: Normal    Mood and Affect  Mood: Anxious  Affect: Congruent   Thought Process  Thought Processes: Disorganized  Descriptions of Associations:Intact  Orientation:Full (Time, Place and Person)  Thought Content: Paranoia at baseline Hallucinations: Denies  Ideas of Reference: Paranoia at baseline Suicidal Thoughts: Denies  Homicidal Thoughts: Denies   Sensorium  Memory: Immediate Fair; Recent Fair; Remote Fair  Judgment: Impaired  Insight: Shallow   Executive Functions  Concentration: Fair  Attention Span: Fair  Recall: [employer] of  Knowledge: Fair  Language: Fair   Psychomotor Activity  Psychomotor Activity: No data recorded  Musculoskeletal: Strength & Muscle Tone: within normal limits Gait & Station: normal Assets  Assets: Manufacturing systems engineer; Resilience    Physical Exam: Physical Exam Vitals and nursing note reviewed.    ROS Blood pressure 104/66, pulse 61, temperature  97.6 F (36.4 C), resp. rate 17, height 6' (1.829 m), weight 100 kg, SpO2 99%. Body mass index is 29.91 kg/m.  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)    Clinical Decision Making: Patient currently admitted after jumping off a two-story building in the context of possible delusions as reported as the people in the house he lived for 4 years trying to kill him.  Patient needs to be monitored closely for ongoing psychosis and paranoid delusions.  Given patient's confusion and chronic paranoia, no family support, APS report has been made and it got screened in for further evaluation by APS.   Differential diagnosis include: unspecified psychosis, delusion disorder, dementia, schizophrenia/schizoaffective disorder   Treatment Plan Summary: Requested the social work team to reach out to APS for safety concerns in the community as patient has chronic paranoia and confusion and is currently homeless   Safety and Monitoring:             -- InVoluntary admission to inpatient psychiatric unit for safety, stabilization and treatment             -- Daily contact with patient to assess and evaluate symptoms and progress in treatment             -- Patient's case to be discussed in multi-disciplinary team meeting             -- Observation Level: q15 minute checks             -- Vital signs:  q12 hours             -- Precautions: suicide, elopement, and assault   2. Psychiatric Diagnoses and Treatment:              Continue Prolixin  to 10 mg twice daily. Will discontinue Zyprexa .  -- The  risks/benefits/side-effects/alternatives to this medication were discussed in detail with the patient and time was given for questions. The patient consents to medication trial.                -- Metabolic profile and EKG monitoring obtained while on an atypical antipsychotic (BMI: Lipid Panel: HbgA1c: QTc:)              -- Encouraged patient to participate in unit milieu and in scheduled group therapies                            3. Medical Issues Being Addressed:    No urgent medical needs noted  4. Discharge Planning:   -- Social work and case management to assist with discharge planning and identification of hospital follow-up needs prior to discharge  -- Estimated LOS: 3-4 days  Desmond Chimera, MD 04/28/2024, 4:42 PM

## 2024-04-28 NOTE — BH IP Treatment Plan (Signed)
 Interdisciplinary Treatment and Diagnostic Plan Update  04/28/2024 Time of Session: 12:30pm Benjamin [name] MRN: [id number]  Principal Diagnosis: Delusional disorder [name])  Secondary Diagnoses: Principal Problem:   Delusional disorder (HCC)   Current Medications:  Current Facility-Administered Medications  Medication Dose Route Frequency Provider Last Rate Last Admin   acetaminophen  (TYLENOL ) tablet 650 mg  650 mg Oral Q6H PRN Coleman, Carolyn H, NP       alum & mag hydroxide-simeth (MAALOX/MYLANTA) 200-200-20 MG/5ML suspension 30 mL  30 mL Oral Q4H PRN Coleman, Carolyn H, NP       docusate sodium  (COLACE) capsule 100 mg  100 mg Oral Daily Jadapalle, Sree, MD   100 mg at 04/28/24 0800   fluPHENAZine  (PROLIXIN ) tablet 10 mg  10 mg Oral BID Nazari, Walid A, RPH   10 mg at 04/28/24 0800   Or   fluPHENAZine  (PROLIXIN ) injection 5 mg  5 mg Intramuscular BID Nazari, Walid A, RPH       magnesium  hydroxide (MILK OF MAGNESIA) suspension 30 mL  30 mL Oral Daily PRN Coleman, Carolyn H, NP       OLANZapine  (ZYPREXA ) injection 5 mg  5 mg Intramuscular TID PRN Mardy Elveria DEL, NP   5 mg at 04/12/24 2230   OLANZapine  zydis (ZYPREXA ) disintegrating tablet 10 mg  10 mg Oral QHS Jadapalle, Sree, MD   10 mg at 04/27/24 2121   OLANZapine  zydis (ZYPREXA ) disintegrating tablet 5 mg  5 mg Oral TID PRN Coleman, Carolyn H, NP       PTA Medications: Medications Prior to Admission  Medication Sig Dispense Refill Last Dose/Taking   docusate sodium  (COLACE) 100 MG capsule Take 100 mg by mouth daily.      omega-3 acid ethyl esters (LOVAZA) 1 g capsule Take 1 g by mouth daily.       Patient Stressors: Traumatic event    Patient Strengths: Communication skills   Treatment Modalities: Medication Management, Group therapy, Case management,  1 to 1 session with clinician, Psychoeducation, Recreational therapy.   Physician Treatment Plan for Primary Diagnosis: Delusional disorder [facility]) Long Term Goal(s):  Improvement in symptoms so as ready for discharge   Short Term Goals: Ability to identify changes in lifestyle to reduce recurrence of condition will improve Ability to verbalize feelings will improve Ability to disclose and discuss suicidal ideas Ability to demonstrate self-control will improve Ability to identify and develop effective coping behaviors will improve  Medication Management: Evaluate patient's response, side effects, and tolerance of medication regimen.  Therapeutic Interventions: 1 to 1 sessions, Unit Group sessions and Medication administration.  Evaluation of Outcomes: Progressing  Physician Treatment Plan for Secondary Diagnosis: Principal Problem:   Delusional disorder (HCC)  Long Term Goal(s): Improvement in symptoms so as ready for discharge   Short Term Goals: Ability to identify changes in lifestyle to reduce recurrence of condition will improve Ability to verbalize feelings will improve Ability to disclose and discuss suicidal ideas Ability to demonstrate self-control will improve Ability to identify and develop effective coping behaviors will improve     Medication Management: Evaluate patient's response, side effects, and tolerance of medication regimen.  Therapeutic Interventions: 1 to 1 sessions, Unit Group sessions and Medication administration.  Evaluation of Outcomes: Progressing   RN Treatment Plan for Primary Diagnosis: Delusional disorder (HCC) Long Term Goal(s): Knowledge of disease and therapeutic regimen to maintain health will improve  Short Term Goals: Ability to remain free from injury will improve, Ability to verbalize frustration and anger appropriately will improve,  Ability to demonstrate self-control, Ability to participate in decision making will improve, Ability to verbalize feelings will improve, Ability to disclose and discuss suicidal ideas, Ability to identify and develop effective coping behaviors will improve, and Compliance with  prescribed medications will improve  Medication Management: RN will administer medications as ordered by provider, will assess and evaluate patient's response and provide education to patient for prescribed medication. RN will report any adverse and/or side effects to prescribing provider.  Therapeutic Interventions: 1 on 1 counseling sessions, Psychoeducation, Medication administration, Evaluate responses to treatment, Monitor vital signs and CBGs as ordered, Perform/monitor CIWA, COWS, AIMS and Fall Risk screenings as ordered, Perform wound care treatments as ordered.  Evaluation of Outcomes: Progressing   LCSW Treatment Plan for Primary Diagnosis: Delusional disorder [name]) Long Term Goal(s): Safe transition to appropriate next level of care at discharge, Engage patient in therapeutic group addressing interpersonal concerns.  Short Term Goals: Engage patient in aftercare planning with referrals and resources, Increase social support, Increase ability to appropriately verbalize feelings, Increase emotional regulation, Facilitate acceptance of mental health diagnosis and concerns, Facilitate patient progression through stages of change regarding substance use diagnoses and concerns, Identify triggers associated with mental health/substance abuse issues, and Increase skills for wellness and recovery  Therapeutic Interventions: Assess for all discharge needs, 1 to 1 time with Social worker, Explore available resources and support systems, Assess for adequacy in community support network, Educate family and significant other(s) on suicide prevention, Complete Psychosocial Assessment, Interpersonal group therapy.  Evaluation of Outcomes: Progressing   Progress in Treatment: Attending groups: Yes. Participating in groups: Yes. Taking medication as prescribed: Yes. Toleration medication: Yes. Family/Significant other contact made: Yes, individual(s) contacted:  [name],  [phone]. Patient understands diagnosis: No. Discussing patient identified problems/goals with staff: Yes. Medical problems stabilized or resolved: Yes. Denies suicidal/homicidal ideation: Yes. Issues/concerns per patient self-inventory: Yes. Other:   New Short Term/Long Term Goal(s): elimination of symptoms of psychosis, medication management for mood stabilization; elimination of SI thoughts; development of comprehensive mental wellness plan. Update 04/02/24: No changes at this time. Update 04/07/24: No changes at this time. Update 04/12/24: No changes at this time Update 04/18/24: No changes at this time  Update 04/23/24: No changes at this time. Update 04/28/2024: No changes at this time.   Patient Goals:  Get out of here, I want to get healed up so I can get out of here Update 04/02/24: No changes at this time. Update 04/07/24: No changes at this time .Update 04/12/24: No changes at this time Update 04/18/24: No changes at this time  Update 04/23/24: No changes at this time. Update 04/28/2024: No changes at this time.   Discharge Plan or Barriers: CSW will assist with appropriate discharge planning  Update 04/02/24: No changes at this time. Update 04/07/24: No changes at this time.Update 04/12/24: No changes at this time Update 04/18/24: CSW submitted report to APS. Care team looking at guardianship for pt at this time  Update 04/23/24: No changes at this time. Update 04/28/2024: No changes at this time.   Reason for Continuation of Hospitalization: Delusions  Medication stabilization   Estimated Length of Stay: 1 to 7 days Update 04/02/24: TBD. Update 04/07/24: TBD Update 04/12/24:TBD. Update 04/28/24:TBD  Last 3 [place] Suicide Severity Risk Score: Flowsheet Row Admission (Current) from 03/27/2024 in [name] ED from 03/26/2024 in [facility] Emergency Department at [facility] ED from 03/25/2024 in [facility]  C-SSRS RISK CATEGORY No Risk No  Risk No  Risk    Last PHQ 2/9 Scores:     No data to display          Scribe for Treatment Team: Aldo CHRISTELLA Niece, KEN 04/28/2024 12:45 PM

## 2024-04-28 NOTE — Plan of Care (Signed)
  Problem: Clinical Measurements: Goal: Will remain free from infection Outcome: Progressing Goal: Respiratory complications will improve Outcome: Progressing   Problem: Activity: Goal: Risk for activity intolerance will decrease Outcome: Progressing   Problem: Coping: Goal: Level of anxiety will decrease Outcome: Progressing   

## 2024-04-28 NOTE — Progress Notes (Signed)
   04/28/24 1100  Psych Admission Type (Psych Patients Only)  Admission Status Involuntary  Psychosocial Assessment  Patient Complaints None  Eye Contact Brief  Facial Expression Flat  Affect Appropriate to circumstance  Speech Soft  Interaction Assertive  Motor Activity Slow  Appearance/Hygiene Unremarkable  Behavior Characteristics Cooperative  Mood Pleasant  Thought Process  Coherency WDL  Content WDL  Delusions None reported or observed  Perception WDL  Hallucination None reported or observed  Judgment Impaired  Danger to Self  Current suicidal ideation? Denies  Danger to Others  Danger to Others None reported or observed

## 2024-04-28 NOTE — Group Note (Signed)
 Date:  04/28/2024 Time:  8:29 PM  Group Topic/Focus:  Overcoming Stress:   The focus of this group is to define stress and help patients assess their triggers.    Participation Level:  Active  Participation Quality:  Appropriate  Affect:  Anxious and Appropriate  Cognitive:  Appropriate  Insight: Good  Engagement in Group:  Engaged  Modes of Intervention:  Discussion  Additional Comments:    Benjamin [name] 04/28/2024, 8:29 PM

## 2024-04-28 NOTE — Progress Notes (Signed)
 Patient has remained in bed resting since the onset of the shift. No acting out behaviors noted/ reported thus far. Compliant with all aspects of nursing care offered. No complaints voiced. No distress noted/ reported at this time. Currently awake in his room, lying in bed. All orders maintained as written.    04/28/24 0007  Psych Admission Type (Psych Patients Only)  Admission Status Involuntary  Psychosocial Assessment  Patient Complaints None  Eye Contact Brief  Facial Expression Flat  Affect Appropriate to circumstance  Speech Soft  Interaction Assertive  Motor Activity Slow  Appearance/Hygiene Unremarkable  Behavior Characteristics Cooperative;Appropriate to situation  Mood Pleasant  Thought Process  Coherency WDL  Content WDL  Delusions None reported or observed  Perception WDL  Hallucination None reported or observed  Judgment Impaired  Confusion Mild  Danger to Self  Current suicidal ideation? Denies  Agreement Not to Harm Self Yes  Description of Agreement verbal  Danger to Others  Danger to Others None reported or observed

## 2024-04-28 NOTE — Progress Notes (Signed)
   04/28/24 1100  Psych Admission Type (Psych Patients Only)  Admission Status Involuntary  Psychosocial Assessment  Patient Complaints None  Eye Contact Brief  Facial Expression Flat  Affect Appropriate to circumstance  Speech Soft  Interaction Assertive  Motor Activity Slow  Appearance/Hygiene Unremarkable  Behavior Characteristics Cooperative  Mood Pleasant  Thought Process  Coherency WDL  Content WDL  Delusions None reported or observed  Perception WDL  Hallucination None reported or observed  Judgment Impaired  Confusion Mild  Danger to Self  Current suicidal ideation? Denies  Danger to Others  Danger to Others None reported or observed

## 2024-04-28 NOTE — Group Note (Signed)
 Date:  04/28/2024 Time:  10:38 AM  Group Topic/Focus:  Goals Group:   The focus of this group is to help patients establish daily goals to achieve during treatment and discuss how the patient can incorporate goal setting into their daily lives to aide in recovery. We also talked about healthy eating and healthy lifestyle choices.     Participation Level:  Active  Participation Quality:  Appropriate  Affect:  Appropriate  Cognitive: Appropriate   Insight: Appropriate  Engagement in Group:  Engaged  Modes of Intervention:  Activity and Discussion  Additional Comments:    Alf Doyle L Gray Maugeri 04/28/2024, 10:38 AM

## 2024-04-29 DIAGNOSIS — F22 Delusional disorders: Secondary | ICD-10-CM | POA: Diagnosis not present

## 2024-04-29 NOTE — Plan of Care (Signed)
  Problem: Clinical Measurements: Goal: Ability to maintain clinical measurements within normal limits will improve Outcome: Progressing   Problem: Nutrition: Goal: Adequate nutrition will be maintained Outcome: Progressing   

## 2024-04-29 NOTE — Progress Notes (Addendum)
   04/29/24 1945  Psych Admission Type (Psych Patients Only)  Admission Status Involuntary  Psychosocial Assessment  Patient Complaints None  Eye Contact Brief  Facial Expression Anxious  Affect Appropriate to circumstance  Speech Soft  Interaction Assertive  Motor Activity Slow  Appearance/Hygiene Unremarkable;In scrubs  Behavior Characteristics Cooperative;Anxious  Mood Anxious;Pleasant  Thought Process  Coherency WDL  Content WDL  Delusions None reported or observed  Perception WDL  Hallucination None reported or observed  Judgment Impaired  Confusion Mild  Danger to Self  Current suicidal ideation? Denies  Danger to Others  Danger to Others None reported or observed   Progress note   D: Pt seen in his room in the bed. Pt denies SI, HI, AVH. Pt rates pain  0/10. Pt rates anxiety  0/10 and depression  0/10. Per shift report, pt has been more social today. Pt does seem less paranoid but still suspicious in nature with his comments. No other concerns noted at this time.  A: Pt provided support and encouragement. Pt given scheduled medication as prescribed. PRNs as appropriate. Q15 min checks for safety.   R: Pt safe on the unit. Will continue to monitor.

## 2024-04-29 NOTE — Progress Notes (Signed)
 Patient is an involuntary admission to [facility] for paranoid delusions after jumping from a 2nd story window believing that people were chasing him to kill him.  Patient has two ortho shoes which he was not wearing today.  His rt foot has a fracture. Patient engaged in group therapy today.  Took his medicine with no problems. Was pleasant with peers and staff.  Denies SI, HI, aVH, anxiety and depression.  Did not appear internally stimulated.  Will continue to monitor.SABRA

## 2024-04-29 NOTE — Group Note (Signed)
 Date:  04/29/2024 Time:  9:26 PM  Group Topic/Focus:  Wrap-Up Group:   The focus of this group is to help patients review their daily goal of treatment and discuss progress on daily workbooks.    Participation Level:  Active  Participation Quality:  Appropriate  Affect:  Appropriate  Cognitive:  Appropriate  Insight: Appropriate  Engagement in Group:  Engaged  Modes of Intervention:  Discussion  Additional Comments:    [name] 04/29/2024, 9:26 PM

## 2024-04-29 NOTE — Progress Notes (Signed)
 Patient was up and visible on the unit at the start of the shift. Calm/cooperative with all aspects of nursing care offered this shift. Consumed HS snack in the day area amongst peers. No complaints voiced. No distress noted/ reported at this time. Safe behaviors displayed thus far. Medication compliant. Slept throughout the night. All orders maintained as written.

## 2024-04-29 NOTE — Progress Notes (Signed)
 [facility] MD Progress Note  04/29/2024 3:55 PM Benjamin [name]  MRN:  [id number] Benjamin [name] is a 78 year old male who presents to the inpatient geriatric psych unit after jumping from a two story building. Patient was originally seen at [facility] who referred him to the ED who admitted him here on 03/27/2024. Patient reports that people from the [place] gang were trying to kill him and the only escape was through the bedroom window on the second floor. He currently lives in the house with [name] and his family and has been living with them for 3-4 years without any problems. On 03/19/2024 he went to the doctor and when he got back home the family wouldn't let him leave. He states that after he realized the gang was going to kill him he barricaded the door with his bed and jumped out the window. When he landed he ran away until one of his neighbors found him and called 911.   Subjective: Per staff report patient is coming out of the room.  Engaging with other peers.  Not noticed to be responding to internal stimuli.  Eating well.  Patient on interview denies any suicidal ideations or any auditory hallucinations.  That he does not feel like somebody is poisoning him.    Sleep: Fair  Appetite:  Fair  Past Psychiatric History: see h&P  Family History:  Family History  Problem Relation Age of Onset   Colon cancer Neg Hx    Colon polyps Neg Hx    Stomach cancer Neg Hx    Esophageal cancer Neg Hx    Social History:  Social History   Substance and Sexual Activity  Alcohol Use No     Social History   Substance and Sexual Activity  Drug Use No    Social History   Socioeconomic History   Marital status: Single    Spouse name: Not on file   Number of children: Not on file   Years of education: Not on file   Highest education level: Not on file  Occupational History   Not on file  Tobacco Use   Smoking status: Former    Types: Cigarettes   Smokeless tobacco: Never   Vaping Use   Vaping status: Never Used  Substance and Sexual Activity   Alcohol use: No   Drug use: No   Sexual activity: Not on file  Other Topics Concern   Not on file  Social History Narrative   Not on file   Social Drivers of Health   Financial Resource Strain: Not on file  Food Insecurity: No Food Insecurity (03/27/2024)   Hunger Vital Sign    Worried About Running Out of Food in the Last Year: Never true    Ran Out of Food in the Last Year: Never true  Transportation Needs: No Transportation Needs (03/27/2024)   PRAPARE - Administrator, Civil Service (Medical): No    Lack of Transportation (Non-Medical): No  Physical Activity: Not on file  Stress: Not on file  Social Connections: Moderately Integrated (03/27/2024)   Social Connection and Isolation Panel    Frequency of Communication with Friends and Family: Twice a week    Frequency of Social Gatherings with Friends and Family: Once a week    Attends Religious Services: 1 to 4 times per year    Active Member of [employer] or Organizations: No    Attends Banker Meetings: 1 to 4 times per year  Marital Status: Divorced   Past Medical History:  Past Medical History:  Diagnosis Date   Allergy    Arthritis    back   Prostate cancer (HCC) dx'd 2010   surg only    Past Surgical History:  Procedure Laterality Date   COLONOSCOPY     EYE SURGERY     PROSTATE SURGERY      Current Medications: Current Facility-Administered Medications  Medication Dose Route Frequency Provider Last Rate Last Admin   acetaminophen  (TYLENOL ) tablet 650 mg  650 mg Oral Q6H PRN Coleman, Carolyn H, NP       alum & mag hydroxide-simeth (MAALOX/MYLANTA) 200-200-20 MG/5ML suspension 30 mL  30 mL Oral Q4H PRN Coleman, Carolyn H, NP       docusate sodium  (COLACE) capsule 100 mg  100 mg Oral Daily Jadapalle, Sree, MD   100 mg at 04/29/24 9085   fluPHENAZine  (PROLIXIN ) tablet 10 mg  10 mg Oral BID Amando Ishikawa, MD   10  mg at 04/29/24 0747   Or   fluPHENAZine  (PROLIXIN ) injection 5 mg  5 mg Intramuscular BID Kyree Fedorko, MD       magnesium  hydroxide (MILK OF MAGNESIA) suspension 30 mL  30 mL Oral Daily PRN Mardy Elveria DEL, NP       OLANZapine  (ZYPREXA ) injection 5 mg  5 mg Intramuscular TID PRN Coleman, Carolyn H, NP   5 mg at 04/12/24 2230   OLANZapine  zydis (ZYPREXA ) disintegrating tablet 10 mg  10 mg Oral QHS Jadapalle, Sree, MD   10 mg at 04/28/24 2109   OLANZapine  zydis (ZYPREXA ) disintegrating tablet 5 mg  5 mg Oral TID PRN Mardy Elveria DEL, NP        Lab Results:  No results found for this or any previous visit (from the past 48 hours).   Blood Alcohol level:  Lab Results  Component Value Date   [facility] <15 03/26/2024    Metabolic Disorder Labs: Lab Results  Component Value Date   HGBA1C 5.6 04/02/2024   MPG 114 04/02/2024   No results found for: PROLACTIN Lab Results  Component Value Date   CHOL 146 04/02/2024   TRIG 37 04/02/2024   HDL 43 04/02/2024   CHOLHDL 3.4 04/02/2024   VLDL 7 04/02/2024   LDLCALC 96 04/02/2024    Physical Findings: AIMS:  , ,  ,  ,    CIWA:    COWS:      Psychiatric Specialty Exam:  Presentation  General Appearance:  Appropriate for Environment; Bizarre  Eye Contact: Fair  Speech: Clear and Coherent  Speech Volume: Normal    Mood and Affect  Mood: Anxious  Affect: Congruent   Thought Process  Thought Processes: Disorganized  Descriptions of Associations:Intact  Orientation:Full (Time, Place and Person)  Thought Content: Paranoia at baseline Hallucinations: Denies  Ideas of Reference: Paranoia at baseline Suicidal Thoughts: Denies  Homicidal Thoughts: Denies   Sensorium  Memory: Immediate Fair; Recent Fair; Remote Fair  Judgment: Impaired  Insight: Shallow   Executive Functions  Concentration: Fair  Attention Span: Fair  Recall: [employer] of  Knowledge: Fair  Language: Fair   Psychomotor Activity  Psychomotor Activity: No data recorded  Musculoskeletal: Strength & Muscle Tone: within normal limits Gait & Station: normal Assets  Assets: Manufacturing systems engineer; Resilience    Physical Exam: Physical Exam Vitals and nursing note reviewed.    ROS Blood pressure 109/73, pulse 67, temperature 98.1 F (36.7 C), resp. rate 18, height 6' (1.829 m),  weight 100 kg, SpO2 98%. Body mass index is 29.91 kg/m.  Diagnosis: Principal Problem:   Delusional disorder [name])    Clinical Decision Making: Patient currently admitted after jumping off a two-story building in the context of possible delusions as reported as the people in the house he lived for 4 years trying to kill him.  Patient needs to be monitored closely for ongoing psychosis and paranoid delusions.  Given patient's confusion and chronic paranoia, no family support, APS report has been made and it got screened in for further evaluation by APS.   Differential diagnosis include: unspecified psychosis, delusion disorder, dementia, schizophrenia/schizoaffective disorder   Treatment Plan Summary: Requested the social work team to reach out to APS for safety concerns in the community as patient has chronic paranoia and confusion and is currently homeless   Safety and Monitoring:             -- InVoluntary admission to inpatient psychiatric unit for safety, stabilization and treatment             -- Daily contact with patient to assess and evaluate symptoms and progress in treatment             -- Patient's case to be discussed in multi-disciplinary team meeting             -- Observation Level: q15 minute checks             -- Vital signs:  q12 hours             -- Precautions: suicide, elopement, and assault   2. Psychiatric Diagnoses and Treatment:              Continue Prolixin  to 10 mg twice daily. Will discontinue Zyprexa .  -- The  risks/benefits/side-effects/alternatives to this medication were discussed in detail with the patient and time was given for questions. The patient consents to medication trial.                -- Metabolic profile and EKG monitoring obtained while on an atypical antipsychotic (BMI: Lipid Panel: HbgA1c: QTc:)              -- Encouraged patient to participate in unit milieu and in scheduled group therapies                            3. Medical Issues Being Addressed:    No urgent medical needs noted  4. Discharge Planning:   -- Social work and case management to assist with discharge planning and identification of hospital follow-up needs prior to discharge  -- Estimated LOS: 3-4 days  Desmond Chimera, MD 04/29/2024, 3:55 PM

## 2024-04-29 NOTE — Group Note (Signed)
 LCSW Group Therapy Note  Group Date: 04/29/2024 Start Time: 1310 End Time: 1400   Type of Therapy and Topic:  Group Therapy - Healthy vs Unhealthy Coping Skills  Participation Level:  Did Not Attend   Description of Group The focus of this group was to determine what unhealthy coping techniques typically are used by group members and what healthy coping techniques would be helpful in coping with various problems. Patients were guided in becoming aware of the differences between healthy and unhealthy coping techniques. Patients were asked to identify 2-3 healthy coping skills they would like to learn to use more effectively.  Therapeutic Goals Patients learned that coping is what human beings do all day long to deal with various situations in their lives Patients defined and discussed healthy vs unhealthy coping techniques Patients identified their preferred coping techniques and identified whether these were healthy or unhealthy Patients determined 2-3 healthy coping skills they would like to become more familiar with and use more often. Patients provided support and ideas to each other   Summary of Patient Progress:   Did Not Attend   Therapeutic Modalities Cognitive Behavioral Therapy Motivational Interviewing  Aldo CHRISTELLA Niece, ISRAEL 04/29/2024  2:58 PM

## 2024-04-30 DIAGNOSIS — F22 Delusional disorders: Secondary | ICD-10-CM | POA: Diagnosis not present

## 2024-04-30 NOTE — Progress Notes (Signed)
(  Sleep Hours) - 9.75 (Any PRNs that were needed, meds refused, or side effects to meds)- none (Any disturbances and when (visitation, over night)- none (Concerns raised by the patient)- none  (SI/HI/AVH)- denies

## 2024-04-30 NOTE — Group Note (Signed)
 Recreation Therapy Group Note   Group Topic:Leisure Education  Group Date: 04/30/2024 Start Time: 1400 End Time: 1500 Facilitators: Celestia Jeoffrey BRAVO, LRT, CTRS Location: Courtyard  Group Description: Music. Patients encouraged to name their favorite song(s) for LRT to play song through speaker for group to hear, while in the courtyard getting fresh air and sunlight. Patients educated on the definition of leisure and the importance of having different leisure interests outside of the hospital. Group discussed how leisure activities can often be used as Pharmacologist and that listening to music and being outside are examples.    Goal Area(s) Addressed:  Patient will identify a current leisure interest.  Patient will practice making a positive decision. Patient will have the opportunity to try a new leisure activity.   Affect/Mood: Appropriate   Participation Level: Active and Engaged   Participation Quality: Independent   Behavior: Calm and Cooperative   Speech/Thought Process: Coherent   Insight: Fair   Judgement: Fair    Modes of Intervention: Guided Discussion, Music, Socialization, and Support   Patient Response to Interventions:  Engaged and Receptive   Education Outcome:  Acknowledges education   Clinical Observations/Individualized Feedback: Benjamin [name] was active in their participation of session activities and group discussion. Pt interacted well with LRT and peers duration of session.    Plan: Continue to engage patient in RT group sessions 2-3x/week.   Jeoffrey BRAVO Celestia, LRT, CTRS 04/30/2024 5:05 PM

## 2024-04-30 NOTE — Group Note (Signed)
 Date:  04/30/2024 Time:  11:55 PM  Group Topic/Focus:  Wrap-Up Group:   The focus of this group is to help patients review their daily goal of treatment and discuss progress on daily workbooks.    Participation Level:  Did Not Attend  Participation Quality:     Affect:     Cognitive:     Insight: None  Engagement in Group:  None  Modes of Intervention:     Additional Comments:    Benjamin [name] 04/30/2024, 11:55 PM

## 2024-04-30 NOTE — Group Note (Signed)
 Physical/Occupational Therapy Group Note  Group Topic: Functional, Dynamic Balance   Group Date: 04/30/2024 Start Time: 1302 End Time: 1332 Facilitators: Bert Givans, Alm Hamilton, PT   Group Description: Group discussed impact of balance on safety and independence with functional tasks.  Identified and discussed any self-perceived balance deficits to personalize information.  Discussed and reviewed strategies to address/improve balance deficits: use of assist devices, activity pacing/energy conservation, environment/home safety modifications, focusing attention/minimizing distraction.  Reviewed and participated with standing LE therex designed to target dynamic balance reactions and LE strength/stability; provided handouts with HEP to be utilized outside of group time as appropriate.  Allowed time for questions and further discussion on any balance or mobility concerns/needs.  Therapeutic Goal(s):  Identify and discuss any individual balance deficits and functional implications. Identify and discuss any environmental/home safety modifications that can optimize balance and safety for mobility within the home. Demonstrate understanding and performance of standing therex designed to target dynamic balance deficits.  Individual Participation: Pt quietly attentive with no communication during discussion but was active during the activity portions of the session.  Pt was generally steady with standing therex/balance activities throughout.    Participation Level: Active   Participation Quality: Minimal Cues   Behavior: Appropriate   Speech/Thought Process: Difficult to assess due to limited to no verbal communication during the session    Affect/Mood: Appropriate   Insight: Good   Judgement: Good   Modes of Intervention: Activity, Discussion, and Education  Patient Response to Interventions:  Attentive, Engaged, and Interested    Plan: Continue to engage patient in PT/OT groups 1 - 2x/week.  CHARM Hamilton Bertin PT, DPT 04/30/24, 2:20 PM

## 2024-04-30 NOTE — Progress Notes (Signed)
 Mood/ affect:  Pleasant and cooperative.    Psych assessment:  Denies SI/HI and AVH.   Behavior:  Present in the milieu.  Appropriate interaction with peers and staff.  Attended 2/3 groups.   Medication/ PRNs: Compliant with scheduled medications. No PRNs needed.   Pain: Denies  15 min checks in place for safety.

## 2024-04-30 NOTE — BHH Counselor (Signed)
 CSW met with pt and [place] with [facility] DSS.    Arch reports that she is unsure of what the plans are for the patient.   CSW provided updates that the patient has Medicaid and has a bank card.  Arch was able to determine that patient does NOT have a bank account but funds are deposited directly to that card.    Arch reports that she will call CSW [employer] the following day.  Sherryle Margo, MSW, LCSW 04/30/2024 3:30 PM

## 2024-04-30 NOTE — Group Note (Signed)
 Date:  04/30/2024 Time:  11:05 AM  Group Topic/Focus:  Goals Group:   The focus of this group is to help patients establish daily goals to achieve during treatment and discuss how the patient can incorporate goal setting into their daily lives to aide in recovery.    Participation Level:  Did Not Attend   [name] Gavel 04/30/2024, 11:05 AM

## 2024-04-30 NOTE — Plan of Care (Signed)
?  Problem: Coping: ?Goal: Ability to verbalize frustrations and anger appropriately will improve ?Outcome: Progressing ?Goal: Ability to demonstrate self-control will improve ?Outcome: Progressing ?  ?Problem: Health Behavior/Discharge Planning: ?Goal: Compliance with treatment plan for underlying cause of condition will improve ?Outcome: Progressing ?  ?
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
## 2024-04-30 NOTE — Plan of Care (Signed)
  Problem: Health Behavior/Discharge Planning: Goal: Ability to manage health-related needs will improve Outcome: Progressing   Problem: Clinical Measurements: Goal: Ability to maintain clinical measurements within normal limits will improve Outcome: Progressing Goal: Will remain free from infection Outcome: Progressing   Problem: Nutrition: Goal: Adequate nutrition will be maintained Outcome: Progressing   Problem: Safety: Goal: Ability to remain free from injury will improve Outcome: Progressing   Problem: Skin Integrity: Goal: Risk for impaired skin integrity will decrease Outcome: Progressing   Problem: Activity: Goal: Interest or engagement in activities will improve Outcome: Progressing Goal: Sleeping patterns will improve Outcome: Progressing

## 2024-05-01 DIAGNOSIS — F22 Delusional disorders: Secondary | ICD-10-CM | POA: Diagnosis not present
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
## 2024-05-01 NOTE — BHH Counselor (Signed)
 CSW contacted [name] [phone]), [name] APS social worker to provide updates on pt and to get updates from [place].   CSW left HIPAA compliant VM requesting return call.   Lum Croft, MSW, [place] 05/01/2024 12:43 PM

## 2024-05-01 NOTE — Progress Notes (Signed)
 [facility] MD Progress Note  05/01/2024 12:07 PM Benjamin [name]  MRN:  [id number] Benjamin [name] is a 78 year old male who presents to the inpatient geriatric psych unit after jumping from a two story building. Patient was originally seen at [facility] who referred him to the ED who admitted him here on 03/27/2024. Patient reports that people from the [place] gang were trying to kill him and the only escape was through the bedroom window on the second floor. He currently lives in the house with [name] and his family and has been living with them for 3-4 years without any problems. On 03/19/2024 he went to the doctor and when he got back home the family wouldn't let him leave. He states that after he realized the gang was going to kill him he barricaded the door with his bed and jumped out the window. When he landed he ran away until one of his neighbors found him and called 911.   Subjective: Chart is reviewed and discussed with the treatment team.  Patient is noted to be resting in his bed.  He reports feeling tired after lunch.  He offers no complaints.  He is taking his medications with no reported side effects.  Per nursing report patient is doing well in the mornings with less paranoia but reports that nighttime shift reports patient is more paranoid and stays in his room and unable to participate in nighttime groups due to paranoia.  Discussed in treatment team about reaching out to APS about guardianship.    Sleep: Fair  Appetite:  Fair  Past Psychiatric History: see h&P  Family History:  Family History  Problem Relation Age of Onset   Colon cancer Neg Hx    Colon polyps Neg Hx    Stomach cancer Neg Hx    Esophageal cancer Neg Hx    Social History:  Social History   Substance and Sexual Activity  Alcohol Use No     Social History   Substance and Sexual Activity  Drug Use No    Social History   Socioeconomic History   Marital status: Single    Spouse name: Not  on file   Number of children: Not on file   Years of education: Not on file   Highest education level: Not on file  Occupational History   Not on file  Tobacco Use   Smoking status: Former    Types: Cigarettes   Smokeless tobacco: Never  Vaping Use   Vaping status: Never Used  Substance and Sexual Activity   Alcohol use: No   Drug use: No   Sexual activity: Not on file  Other Topics Concern   Not on file  Social History Narrative   Not on file   Social Drivers of Health   Financial Resource Strain: Not on file  Food Insecurity: No Food Insecurity (03/27/2024)   Hunger Vital Sign    Worried About Running Out of Food in the Last Year: Never true    Ran Out of Food in the Last Year: Never true  Transportation Needs: No Transportation Needs (03/27/2024)   PRAPARE - Administrator, Civil Service (Medical): No    Lack of Transportation (Non-Medical): No  Physical Activity: Not on file  Stress: Not on file  Social Connections: Moderately Integrated (03/27/2024)   Social Connection and Isolation Panel    Frequency of Communication with Friends and Family: Twice a week    Frequency of Social Gatherings with Friends and  Family: Once a week    Attends Religious Services: 1 to 4 times per year    Active Member of Clubs or Organizations: No    Attends Engineer, structural: 1 to 4 times per year    Marital Status: Divorced   Past Medical History:  Past Medical History:  Diagnosis Date   Allergy    Arthritis    back   Prostate cancer (HCC) dx'd 2010   surg only    Past Surgical History:  Procedure Laterality Date   COLONOSCOPY     EYE SURGERY     PROSTATE SURGERY      Current Medications: Current Facility-Administered Medications  Medication Dose Route Frequency Provider Last Rate Last Admin   acetaminophen  (TYLENOL ) tablet 650 mg  650 mg Oral Q6H PRN Coleman, Carolyn H, NP       alum & mag hydroxide-simeth (MAALOX/MYLANTA) 200-200-20 MG/5ML suspension 30  mL  30 mL Oral Q4H PRN Coleman, Carolyn H, NP       docusate sodium  (COLACE) capsule 100 mg  100 mg Oral Daily Delara Shepheard, MD   100 mg at 05/01/24 9146   fluPHENAZine  (PROLIXIN ) tablet 10 mg  10 mg Oral BID Shrivastava, Aryendra, MD   10 mg at 05/01/24 9145   Or   fluPHENAZine  (PROLIXIN ) injection 5 mg  5 mg Intramuscular BID Shrivastava, Aryendra, MD       magnesium  hydroxide (MILK OF MAGNESIA) suspension 30 mL  30 mL Oral Daily PRN Mardy Elveria DEL, NP       OLANZapine  (ZYPREXA ) injection 5 mg  5 mg Intramuscular TID PRN Mardy Elveria DEL, NP   5 mg at 04/12/24 2230   OLANZapine  zydis (ZYPREXA ) disintegrating tablet 5 mg  5 mg Oral TID PRN Mardy Elveria DEL, NP        Lab Results:  No results found for this or any previous visit (from the past 48 hours).   Blood Alcohol level:  Lab Results  Component Value Date   [facility] <15 03/26/2024    Metabolic Disorder Labs: Lab Results  Component Value Date   HGBA1C 5.6 04/02/2024   MPG 114 04/02/2024   No results found for: PROLACTIN Lab Results  Component Value Date   CHOL 146 04/02/2024   TRIG 37 04/02/2024   HDL 43 04/02/2024   CHOLHDL 3.4 04/02/2024   VLDL 7 04/02/2024   LDLCALC 96 04/02/2024    Physical Findings: AIMS:  , ,  ,  ,    CIWA:    COWS:      Psychiatric Specialty Exam:  Presentation  General Appearance:  Appropriate for Environment; Bizarre  Eye Contact: Fair  Speech: Clear and Coherent  Speech Volume: Normal    Mood and Affect  Mood: Anxious  Affect: Congruent   Thought Process  Thought Processes: Disorganized  Descriptions of Associations:Intact  Orientation:Full (Time, Place and Person)  Thought Content: Paranoia at baseline Hallucinations: Denies  Ideas of Reference: Paranoia at baseline Suicidal Thoughts: Denies  Homicidal Thoughts: Denies   Sensorium  Memory: Immediate Fair; Recent Fair; Remote Fair  Judgment: Impaired  Insight: Shallow   Executive  Functions  Concentration: Fair  Attention Span: Fair  Recall: [employer] of Knowledge: Fair  Language: Fair   Psychomotor Activity  Psychomotor Activity: No data recorded  Musculoskeletal: Strength & Muscle Tone: within normal limits Gait & Station: normal Assets  Assets: Manufacturing systems engineer; Resilience    Physical Exam: Physical Exam Vitals and nursing note reviewed.  ROS Blood pressure 121/74, pulse 72, temperature 98.2 F (36.8 C), resp. rate 18, height 6' (1.829 m), weight 100 kg, SpO2 97%. Body mass index is 29.91 kg/m.  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  Patient needs to be monitored closely for ongoing psychosis and paranoid delusions.  Given patient's confusion and chronic paranoia, no family support, APS report has been made and it got screened in for further evaluation by APS.   Differential diagnosis include: unspecified psychosis, delusion disorder, dementia, schizophrenia/schizoaffective disorder   Treatment Plan Summary: Requested the social work team to reach out to APS for safety concerns in the community as patient has chronic paranoia and confusion and is currently homeless   Safety and Monitoring:             -- InVoluntary admission to inpatient psychiatric unit for safety, stabilization and treatment             -- Daily contact with patient to assess and evaluate symptoms and progress in treatment             -- Patient's case to be discussed in multi-disciplinary team meeting             -- Observation Level: q15 minute checks             -- Vital signs:  q12 hours             -- Precautions: suicide, elopement, and assault   2. Psychiatric Diagnoses and Treatment:              Continue Prolixin  to  10 mg twice daily. Will discontinue Zyprexa .  -- The risks/benefits/side-effects/alternatives to this medication were discussed in detail with the patient and time was given for questions. The patient consents to medication trial.                -- Metabolic profile and EKG monitoring obtained while on an atypical antipsychotic (BMI: Lipid Panel: HbgA1c: QTc:)              -- Encouraged patient to participate in unit milieu and in scheduled group therapies                            3. Medical Issues Being Addressed:    No urgent medical needs noted  4. Discharge Planning:   -- Social work and case management to assist with discharge planning and identification of hospital follow-up needs prior to discharge  -- Estimated LOS: 3-4 days  Jalacia Mattila, MD 05/01/2024, 12:07 PM

## 2024-05-01 NOTE — Group Note (Signed)
 Recreation Therapy Group Note   Group Topic:Coping Skills  Group Date: 05/01/2024 Start Time: 1400 End Time: 1500 Facilitators: Celestia Jeoffrey BRAVO, LRT, CTRS Location: Courtyard  Group Description: Mind Map.  Patient was provided a blank template of a diagram with 32 blank boxes in a tiered system, branching from the center (similar to a bubble chart). LRT directed patients to label the middle of the diagram Coping Skills. LRT and patients then came up with 8 different coping skills as examples. Pt were directed to record their coping skills in the 2nd tier boxes closest to the center.  Patients would then share their coping skills with the group as LRT wrote them out. LRT gave a handout of 99 different coping skills at the end of group.   Goal Area(s) Addressed: Patients will be able to define "coping skills". Patient will identify new coping skills.  Patient will increase communication.  Affect/Mood: Appropriate   Participation Level: Active and Engaged   Participation Quality: Independent   Behavior: Appropriate, Calm, and Cooperative   Speech/Thought Process: Coherent   Insight: Fair   Judgement: Fair    Modes of Intervention: Education, Exploration, and Worksheet   Patient Response to Interventions:  Attentive, Engaged, Interested , and Receptive   Education Outcome:  Acknowledges education   Clinical Observations/Individualized Feedback: [place] was active in their participation of session activities and group discussion. Pt identified cards as coping skills.    Plan: Continue to engage patient in RT group sessions 2-3x/week.   Jeoffrey BRAVO Celestia, LRT, CTRS 05/01/2024 5:40 PM

## 2024-05-01 NOTE — Group Note (Signed)
 Date:  05/01/2024 Time:  11:49 PM  Group Topic/Focus:  Wrap-Up Group:   The focus of this group is to help patients review their daily goal of treatment and discuss progress on daily workbooks.    Participation Level:  Did Not Attend  Participation Quality:     Affect:     Cognitive:     Insight: None  Engagement in Group:  None  Modes of Intervention:     Additional Comments:    [name] 05/01/2024, 11:49 PM

## 2024-05-01 NOTE — Progress Notes (Signed)
   05/01/24 0720  Psych Admission Type (Psych Patients Only)  Admission Status Involuntary  Psychosocial Assessment  Patient Complaints Suspiciousness  Eye Contact Fair  Facial Expression Animated  Affect Anxious  Speech Soft  Interaction Assertive  Motor Activity Slow  Appearance/Hygiene In scrubs  Behavior Characteristics Cooperative  Mood Suspicious  Thought Process  Coherency WDL  Content Paranoia  Delusions Paranoid  Perception WDL  Hallucination None reported or observed  Judgment Impaired  Confusion Mild  Danger to Self  Current suicidal ideation? Denies

## 2024-05-01 NOTE — Group Note (Signed)
 LCSW Group Therapy Note  Group Date: 05/01/2024 Start Time: 1300 End Time: 1330   Type of Therapy and Topic:  Group Therapy: Positive Affirmations  Participation Level:  Did Not Attend   Description of Group:   This group addressed positive affirmation towards self and others.  Patients went around the room and identified two positive things about themselves and two positive things about a peer in the room.  Patients reflected on how it felt to share something positive with others, to identify positive things about themselves, and to hear positive things from others/ Patients were encouraged to have a daily reflection of positive characteristics or circumstances.   Therapeutic Goals: Patients will verbalize two of their positive qualities Patients will demonstrate empathy for others by stating two positive qualities about a peer in the group Patients will verbalize their feelings when voicing positive self affirmations and when voicing positive affirmations of others Patients will discuss the potential positive impact on their wellness/recovery of focusing on positive traits of self and others.  Summary of Patient Progress: X  Therapeutic Modalities:   Cognitive Behavioral Therapy Motivational Interviewing    [name], [place] 05/01/2024  1:47 PM

## 2024-05-01 NOTE — Group Note (Signed)
 Date:  05/01/2024 Time:  11:09 AM  Group Topic/Focus:  Self Care:   The focus of this group is to help patients understand the importance of self-care in order to improve or restore emotional, physical, spiritual, interpersonal, and financial health.    Participation Level:  Did Not Attend   [name] Gavel 05/01/2024, 11:09 AM

## 2024-05-01 NOTE — Plan of Care (Signed)
  Problem: Health Behavior/Discharge Planning: Goal: Ability to manage health-related needs will improve Outcome: Progressing   Problem: Clinical Measurements: Goal: Ability to maintain clinical measurements within normal limits will improve Outcome: Progressing Goal: Will remain free from infection Outcome: Progressing Goal: Diagnostic test results will improve Outcome: Progressing Goal: Cardiovascular complication will be avoided Outcome: Progressing   Problem: Activity: Goal: Risk for activity intolerance will decrease Outcome: Progressing   Problem: Nutrition: Goal: Adequate nutrition will be maintained Outcome: Progressing   Problem: Coping: Goal: Level of anxiety will decrease Outcome: Progressing

## 2024-05-02 DIAGNOSIS — F22 Delusional disorders: Secondary | ICD-10-CM | POA: Diagnosis not present

## 2024-05-02 NOTE — Group Note (Unsigned)
 Date:  05/02/2024 Time:  10:52 AM  Group Topic/Focus:  Fresh air therapy with music and cards     Participation Level:  {BHH PARTICIPATION OZCZO:77735}  Participation Quality:  {BHH PARTICIPATION QUALITY:22265}  Affect:  {BHH AFFECT:22266}  Cognitive:  {BHH COGNITIVE:22267}  Insight: {BHH Insight2:20797}  Engagement in Group:  {BHH ENGAGEMENT IN HMNLE:77731}  Modes of Intervention:  {BHH MODES OF INTERVENTION:22269}  Additional Comments:  ***  [name] 05/02/2024, 10:52 AM

## 2024-05-02 NOTE — Progress Notes (Signed)
 Pt received in bed with eyes closed. RR even and unlabored. Flat affect. Pt remains suspicious with limited interaction with peers and staff. Pt out of bed to dayroom for snacks. Did not attend group. Po med compliant. No PRNs given. Denies SI/HI/AVH. No behavior issues noted. Q 15 min checks maintained for safety.  No c/o pain/discomfort noted.     05/01/24 2100  Psych Admission Type (Psych Patients Only)  Admission Status Involuntary  Psychosocial Assessment  Patient Complaints Suspiciousness  Eye Contact Fair  Facial Expression Animated  Affect Anxious  Speech Soft  Interaction Assertive  Motor Activity Slow  Appearance/Hygiene In scrubs  Behavior Characteristics Cooperative  Mood Suspicious  Thought Process  Coherency WDL  Content Paranoia  Delusions Paranoid  Perception WDL  Hallucination None reported or observed  Judgment Impaired  Confusion Mild  Danger to Self  Current suicidal ideation? Denies

## 2024-05-02 NOTE — Progress Notes (Signed)
 [name] MD Progress Note  05/02/2024 4:34 PM Benjamin [name]  MRN:  [id number] Benjamin [name] is a 78 year old male who presents to the inpatient geriatric psych unit after jumping from a two story building. Patient was originally seen at [facility] who referred him to the ED who admitted him here on 03/27/2024. Patient reports that people from the [employer] gang were trying to kill him and the only escape was through the bedroom window on the second floor. He currently lives in the house with [name] and his family and has been living with them for 3-4 years without any problems. On 03/19/2024 he went to the doctor and when he got back home the family wouldn't let him leave. He states that after he realized the gang was going to kill him he barricaded the door with his bed and jumped out the window. When he landed he ran away until one of his neighbors found him and called 911.   Subjective: Chart is reviewed and discussed with the treatment team.  Patient is noted to be resting in his bed.  He offers no complaints.  Per nursing staff patient is less paranoid during the daytime but displays sundowning/paranoia later in the evening.  Patient denies SI/HI/plan and denies hallucinations.   Sleep: Fair  Appetite:  Fair  Past Psychiatric History: see h&P  Family History:  Family History  Problem Relation Age of Onset   Colon cancer Neg Hx    Colon polyps Neg Hx    Stomach cancer Neg Hx    Esophageal cancer Neg Hx    Social History:  Social History   Substance and Sexual Activity  Alcohol Use No     Social History   Substance and Sexual Activity  Drug Use No    Social History   Socioeconomic History   Marital status: Single    Spouse name: Not on file   Number of children: Not on file   Years of education: Not on file   Highest education level: Not on file  Occupational History   Not on file  Tobacco Use   Smoking status: Former    Types: Cigarettes   Smokeless  tobacco: Never  Vaping Use   Vaping status: Never Used  Substance and Sexual Activity   Alcohol use: No   Drug use: No   Sexual activity: Not on file  Other Topics Concern   Not on file  Social History Narrative   Not on file   Social Drivers of Health   Financial Resource Strain: Not on file  Food Insecurity: No Food Insecurity (03/27/2024)   Hunger Vital Sign    Worried About Running Out of Food in the Last Year: Never true    Ran Out of Food in the Last Year: Never true  Transportation Needs: No Transportation Needs (03/27/2024)   PRAPARE - Administrator, Civil Service (Medical): No    Lack of Transportation (Non-Medical): No  Physical Activity: Not on file  Stress: Not on file  Social Connections: Moderately Integrated (03/27/2024)   Social Connection and Isolation Panel    Frequency of Communication with Friends and Family: Twice a week    Frequency of Social Gatherings with Friends and Family: Once a week    Attends Religious Services: 1 to 4 times per year    Active Member of [employer] or Organizations: No    Attends Banker Meetings: 1 to 4 times per year    Marital Status:  Divorced   Past Medical History:  Past Medical History:  Diagnosis Date   Allergy    Arthritis    back   Prostate cancer (HCC) dx'd 2010   surg only    Past Surgical History:  Procedure Laterality Date   COLONOSCOPY     EYE SURGERY     PROSTATE SURGERY      Current Medications: Current Facility-Administered Medications  Medication Dose Route Frequency Provider Last Rate Last Admin   acetaminophen  (TYLENOL ) tablet 650 mg  650 mg Oral Q6H PRN Coleman, Carolyn H, NP       alum & mag hydroxide-simeth (MAALOX/MYLANTA) 200-200-20 MG/5ML suspension 30 mL  30 mL Oral Q4H PRN Coleman, Carolyn H, NP       docusate sodium  (COLACE) capsule 100 mg  100 mg Oral Daily Thresa Dozier, MD   100 mg at 05/02/24 0800   fluPHENAZine  (PROLIXIN ) tablet 10 mg  10 mg Oral BID Shrivastava,  Aryendra, MD   10 mg at 05/02/24 0801   Or   fluPHENAZine  (PROLIXIN ) injection 5 mg  5 mg Intramuscular BID Shrivastava, Aryendra, MD       magnesium  hydroxide (MILK OF MAGNESIA) suspension 30 mL  30 mL Oral Daily PRN Mardy Elveria DEL, NP       OLANZapine  (ZYPREXA ) injection 5 mg  5 mg Intramuscular TID PRN Mardy Elveria DEL, NP   5 mg at 04/12/24 2230   OLANZapine  zydis (ZYPREXA ) disintegrating tablet 5 mg  5 mg Oral TID PRN Mardy Elveria DEL, NP        Lab Results:  No results found for this or any previous visit (from the past 48 hours).   Blood Alcohol level:  Lab Results  Component Value Date   [facility] <15 03/26/2024    Metabolic Disorder Labs: Lab Results  Component Value Date   HGBA1C 5.6 04/02/2024   MPG 114 04/02/2024   No results found for: PROLACTIN Lab Results  Component Value Date   CHOL 146 04/02/2024   TRIG 37 04/02/2024   HDL 43 04/02/2024   CHOLHDL 3.4 04/02/2024   VLDL 7 04/02/2024   LDLCALC 96 04/02/2024    Physical Findings: AIMS:  , ,  ,  ,    CIWA:    COWS:      Psychiatric Specialty Exam:  Presentation  General Appearance:  Appropriate for Environment; Bizarre  Eye Contact: Fair  Speech: Clear and Coherent  Speech Volume: Normal    Mood and Affect  Mood: Anxious  Affect: Congruent   Thought Process  Thought Processes: Disorganized  Descriptions of Associations:Intact  Orientation:Full (Time, Place and Person)  Thought Content: Paranoia at baseline Hallucinations: Denies  Ideas of Reference: Paranoia at baseline Suicidal Thoughts: Denies  Homicidal Thoughts: Denies   Sensorium  Memory: Immediate Fair; Recent Fair; Remote Fair  Judgment: Impaired  Insight: Shallow   Executive Functions  Concentration: Fair  Attention Span: Fair  Recall: [employer] of Knowledge: Fair  Language: Fair   Psychomotor Activity  Psychomotor Activity: No data recorded  Musculoskeletal: Strength & Muscle  Tone: within normal limits Gait & Station: normal Assets  Assets: Manufacturing systems engineer; Resilience    Physical Exam: Physical Exam Vitals and nursing note reviewed.    ROS Blood pressure 128/88, pulse 74, temperature (!) 97.2 F (36.2 C), resp. rate 18, height 6' (1.829 m), weight 100 kg, SpO2 97%. Body mass index is 29.91 kg/m.  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  Patient needs to be monitored closely for ongoing psychosis and paranoid delusions.  Given patient's confusion and chronic paranoia, no family support, APS report has been made and it got screened in for further evaluation by APS.   Differential diagnosis include: unspecified psychosis, delusion disorder, dementia, schizophrenia/schizoaffective disorder   Treatment Plan Summary: Requested the social work team to reach out to APS for safety concerns in the community as patient has chronic paranoia and confusion and is currently homeless   Safety and Monitoring:             -- InVoluntary admission to inpatient psychiatric unit for safety, stabilization and treatment             -- Daily contact with patient to assess and evaluate symptoms and progress in treatment             -- Patient's case to be discussed in multi-disciplinary team meeting             -- Observation Level: q15 minute checks             -- Vital signs:  q12 hours             -- Precautions: suicide, elopement, and assault   2. Psychiatric Diagnoses and Treatment:              Continue Prolixin  to 10 mg twice daily. Will discontinue Zyprexa .  -- The risks/benefits/side-effects/alternatives to this medication were discussed in detail with the patient and time was given for questions. The patient consents to medication  trial.                -- Metabolic profile and EKG monitoring obtained while on an atypical antipsychotic (BMI: Lipid Panel: HbgA1c: QTc:)              -- Encouraged patient to participate in unit milieu and in scheduled group therapies                            3. Medical Issues Being Addressed:    No urgent medical needs noted  4. Discharge Planning:   -- Social work and case management to assist with discharge planning and identification of hospital follow-up needs prior to discharge  -- Estimated LOS: 3-4 days  Keston Seever, MD 05/02/2024, 4:34 PM

## 2024-05-02 NOTE — Group Note (Signed)
 Date:  05/02/2024 Time:  10:59 AM  Group Topic/Focus:  Fresh air therapy with music and cards    Participation Level:  Did Not Attend    [name] 05/02/2024, 10:59 AM

## 2024-05-02 NOTE — Plan of Care (Signed)

## 2024-05-02 NOTE — Group Note (Signed)
 Date:  05/02/2024 Time:  8:56 PM  Group Topic/Focus:  Coping With Mental Health Crisis:   The purpose of this group is to help patients identify strategies for coping with mental health crisis.  Group discusses possible causes of crisis and ways to manage them effectively. We also discussed rules and expectations for the unit and did a word search of their choice.    Participation Level:  Active  Participation Quality:  Appropriate  Affect:  Appropriate  Cognitive:  Appropriate  Insight: Appropriate  Engagement in Group:  Engaged  Modes of Intervention:  Discussion  Additional Comments:    Leigh VEAR Pais 05/02/2024, 8:56 PM

## 2024-05-02 NOTE — Progress Notes (Signed)
   05/02/24 0731  Psych Admission Type (Psych Patients Only)  Admission Status Involuntary  Psychosocial Assessment  Patient Complaints Suspiciousness  Eye Contact Fair  Facial Expression Animated  Affect Anxious  Speech Soft  Interaction Assertive  Motor Activity Slow  Appearance/Hygiene In scrubs  Behavior Characteristics Cooperative  Mood Suspicious  Thought Process  Coherency WDL  Content Paranoia  Delusions Paranoid  Perception WDL  Hallucination None reported or observed  Judgment Impaired  Confusion Mild  Danger to Self  Current suicidal ideation? Denies

## 2024-05-03 DIAGNOSIS — F22 Delusional disorders: Secondary | ICD-10-CM | POA: Diagnosis not present

## 2024-05-03 NOTE — Group Note (Signed)
 [facility] LCSW Group Therapy Note    Group Date: 05/03/2024 Start Time: 1330 End Time: 1400  Type of Therapy and Topic:  Group Therapy:  Overcoming Obstacles  Participation Level:  [facility] PARTICIPATION LEVEL: Minimal  Mood:  Description of Group:   In this group patients will be encouraged to explore what they see as obstacles to their own wellness and recovery. They will be guided to discuss their thoughts, feelings, and behaviors related to these obstacles. The group will process together ways to cope with barriers, with attention given to specific choices patients can make. Each patient will be challenged to identify changes they are motivated to make in order to overcome their obstacles. This group will be process-oriented, with patients participating in exploration of their own experiences as well as giving and receiving support and challenge from other group members.  Therapeutic Goals: 1. Patient will identify personal and current obstacles as they relate to admission. 2. Patient will identify barriers that currently interfere with their wellness or overcoming obstacles.  3. Patient will identify feelings, thought process and behaviors related to these barriers. 4. Patient will identify two changes they are willing to make to overcome these obstacles:    Summary of Patient Progress   Pt was appropriate throughout group   Therapeutic Modalities:   Cognitive Behavioral Therapy Solution Focused Therapy Motivational Interviewing Relapse Prevention Therapy   Benjamin [name], LCSWA

## 2024-05-03 NOTE — Group Note (Signed)
 Date:  05/03/2024 Time:  11:22 AM  Group Topic/Focus:  Building Self Esteem:   The Focus of this group is helping patients become aware of the effects of self-esteem on their lives, the things they and others do that enhance or undermine their self-esteem, seeing the relationship between their level of self-esteem and the choices they make and learning ways to enhance self-esteem.    Participation Level:  Active  Participation Quality:  Appropriate  Affect:  Appropriate  Cognitive:  Appropriate  Insight: Appropriate  Engagement in Group:  Engaged  Modes of Intervention:  Discussion   Arland Nutting 05/03/2024, 11:22 AM

## 2024-05-03 NOTE — Progress Notes (Signed)
  Patient has remained withdrawn to self and room since the onset of the shift. Calm/ cooperative upon approach. Compliant with all aspects of nursing care provided. Medication compliant. Safe behaviors displayed thus far. Slept throughout the night. Remains in bed resting at this time. Rise and fall of chest noted at this time. All orders maintained as written.      05/03/24 0123  Psych Admission Type (Psych Patients Only)  Admission Status Involuntary  Psychosocial Assessment  Patient Complaints Suspiciousness  Eye Contact Brief  Facial Expression Animated  Affect Appropriate to circumstance  Speech Soft  Interaction Assertive  Motor Activity Slow  Appearance/Hygiene In scrubs  Behavior Characteristics Cooperative  Mood Pleasant  Thought Process  Coherency WDL  Content Paranoia  Delusions Paranoid  Perception WDL  Hallucination None reported or observed  Judgment Impaired  Confusion Mild  Danger to Self  Current suicidal ideation? Denies  Agreement Not to Harm Self Yes  Description of Agreement verbal  Danger to Others  Danger to Others None reported or observed

## 2024-05-03 NOTE — Group Note (Signed)
 Recreation Therapy Group Note   Group Topic:Leisure Education  Group Date: 05/03/2024 Start Time: 1400 End Time: 1500 Facilitators: Celestia Jeoffrey BRAVO, LRT, CTRS Location: Courtyard  Group Description: Music. Patients encouraged to name their favorite song(s) for LRT to play song through speaker for group to hear, while in the courtyard getting fresh air and sunlight. Patients educated on the definition of leisure and the importance of having different leisure interests outside of the hospital. Group discussed how leisure activities can often be used as Pharmacologist and that listening to music and being outside are examples.    Goal Area(s) Addressed:  Patient will identify a current leisure interest.  Patient will practice making a positive decision. Patient will have the opportunity to try a new leisure activity.   Affect/Mood: Flat   Participation Level: Minimal    Clinical Observations/Individualized Feedback: [place] came late to group. Pt chose to stand in the back away from peers. Pt minimally interacted while present.   Plan: Continue to engage patient in RT group sessions 2-3x/week.   Jeoffrey BRAVO Celestia, LRT, CTRS 05/03/2024 5:01 PM

## 2024-05-03 NOTE — NC FL2 (Signed)
  [place]  MEDICAID [id number] LEVEL OF CARE FORM     IDENTIFICATION  Patient Name: Caspar Favila Birthdate: 07-Apr-1946 Sex: male Admission Date (Current Location): 03/27/2024  [place] and [place] Number:  [place] [id number] [facility] Facility and Address:  [facility], [address] [place], [place] [address]      Provider Number: [id number]  Attending Physician Name and Address:  Donnelly Mellow, MD  Relative Name and Phone Number:  [name], DSS social worker, [phone]    Current Level of Care: Hospital Recommended Level of Care: Other (Comment) (Group Home with 24/7 Supervision) Prior Approval Number:    Date Approved/Denied:   PASRR Number:    Discharge Plan: Other (Comment) (Group Home with 24/7 Supervision)    Current Diagnoses: Patient Active Problem List   Diagnosis Date Noted   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   Psychosis (HCC) 03/26/2024    Orientation RESPIRATION BLADDER Height & Weight     Self, Situation, Time, Place  Normal Continent Weight: 220 lb 8 oz (100 kg) Height:  6' (182.9 cm)  BEHAVIORAL SYMPTOMS/MOOD NEUROLOGICAL BOWEL NUTRITION STATUS  Dangerous to self, others or property   Continent    AMBULATORY STATUS COMMUNICATION OF NEEDS Skin   Independent (Post-Op shoes from fracture) Verbally Normal                       Personal Care Assistance Level of Assistance              Functional Limitations Info  Speech     Speech Info: Impaired (Mumbles at times)    SPECIAL CARE FACTORS FREQUENCY  Blood pressure Blood Pressure Frequency: 2x times a day in hospital                  Contractures Contractures Info: Not present    Additional Factors Info  Psychotropic, Code Status, Allergies Code Status Info: FULL Allergies Info: None Psychotropic Info: fluPHENAZine  (PROLIXIN ) tablet 10 mg         Current Medications (05/03/2024):  This is the current hospital active medication list Current  Facility-Administered Medications  Medication Dose Route Frequency Provider Last Rate Last Admin   acetaminophen  (TYLENOL ) tablet 650 mg  650 mg Oral Q6H PRN Coleman, Carolyn H, NP       alum & mag hydroxide-simeth (MAALOX/MYLANTA) 200-200-20 MG/5ML suspension 30 mL  30 mL Oral Q4H PRN Coleman, Carolyn H, NP       docusate sodium  (COLACE) capsule 100 mg  100 mg Oral Daily Jadapalle, Sree, MD   100 mg at 05/03/24 9056   fluPHENAZine  (PROLIXIN ) tablet 10 mg  10 mg Oral BID Shrivastava, Aryendra, MD   10 mg at 05/03/24 0747   Or   fluPHENAZine  (PROLIXIN ) injection 5 mg  5 mg Intramuscular BID Shrivastava, Aryendra, MD       magnesium  hydroxide (MILK OF MAGNESIA) suspension 30 mL  30 mL Oral Daily PRN Coleman, Carolyn H, NP       OLANZapine  (ZYPREXA ) injection 5 mg  5 mg Intramuscular TID PRN Coleman, Carolyn H, NP   5 mg at 04/12/24 2230   OLANZapine  zydis (ZYPREXA ) disintegrating tablet 5 mg  5 mg Oral TID PRN Coleman, Carolyn H, NP         Discharge Medications: Please see discharge summary for a list of discharge medications.  Relevant Imaging Results:  Relevant Lab Results:   Additional Information SSN# [id number]  [name], LCSWA

## 2024-05-03 NOTE — Group Note (Signed)
 Date:  05/03/2024 Time:  9:04 PM  Group Topic/Focus:  Wellness Toolbox:   The focus of this group is to discuss various aspects of wellness, balancing those aspects and exploring ways to increase the ability to experience wellness.  Patients will create a wellness toolbox for use upon discharge.    Participation Level:  Active  Participation Quality:  Appropriate  Affect:  Appropriate  Cognitive:  Appropriate  Insight: Good  Engagement in Group:  Engaged  Modes of Intervention:  Discussion  Additional Comments:    Benjamin [name] [place] 05/03/2024, 9:04 PM

## 2024-05-03 NOTE — Plan of Care (Signed)
   Problem: Coping: Goal: Level of anxiety will decrease Outcome: Progressing   Problem: Pain Managment: Goal: General experience of comfort will improve and/or be controlled Outcome: Progressing   Problem: Safety: Goal: Ability to remain free from injury will improve Outcome: Progressing
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
## 2024-05-03 NOTE — Progress Notes (Signed)
   05/03/24 1100  Psych Admission Type (Psych Patients Only)  Admission Status Involuntary  Psychosocial Assessment  Patient Complaints None  Eye Contact Fair  Facial Expression Animated  Affect Appropriate to circumstance  Speech Soft  Interaction Assertive  Motor Activity Slow  Appearance/Hygiene In scrubs  Behavior Characteristics Cooperative  Mood Pleasant  Thought Process  Coherency WDL  Content WDL  Delusions Paranoid  Perception WDL  Hallucination None reported or observed  Judgment Impaired  Confusion Mild  Danger to Self  Current suicidal ideation? Denies  Agreement Not to Harm Self Yes  Description of Agreement verbal  Danger to Others  Danger to Others None reported or observed

## 2024-05-04 DIAGNOSIS — F22 Delusional disorders: Secondary | ICD-10-CM | POA: Diagnosis not present

## 2024-05-04 MED ORDER — POLYETHYLENE GLYCOL 3350 17 G PO PACK
17.0000 g | PACK | Freq: Every day | ORAL | Status: DC | PRN
  Administered 2024-05-04 – 2024-06-04 (×3): 17 g via ORAL
  Filled 2024-05-04 (×3): qty 1

## 2024-05-04 NOTE — Progress Notes (Signed)
 Estimated Sleeping Duration (Last 24 Hours): 9.50-10.75 hours   (Sleep Hours) - 9.5-10.75 hrs (Any PRNs that were needed, meds refused, or side effects to meds)- None (Any disturbances and when (visitation, over night)- None (Concerns raised by the patient)- None (SI/HI/AVH)- Denied all   05/03/24 2307  Psych Admission Type (Psych Patients Only)  Admission Status Involuntary  Psychosocial Assessment  Patient Complaints None  Eye Contact Fair  Facial Expression Animated  Affect Appropriate to circumstance  Speech Soft  Interaction Assertive  Motor Activity Slow  Appearance/Hygiene In scrubs  Behavior Characteristics Cooperative  Mood Pleasant  Thought Process  Coherency WDL  Content WDL  Delusions None reported or observed  Perception WDL  Hallucination None reported or observed  Judgment Impaired  Confusion Mild  Danger to Self  Current suicidal ideation? Denies  Agreement Not to Harm Self Yes  Description of Agreement verbal  Danger to Others  Danger to Others None reported or observed

## 2024-05-04 NOTE — BH IP Treatment Plan (Signed)
 Interdisciplinary Treatment and Diagnostic Plan Update  05/04/2024 Time of Session: 3:00 PM  Benjamin [name] MRN: [id number]  Principal Diagnosis: Delusional disorder [name])  Secondary Diagnoses: Principal Problem:   Delusional disorder (HCC)   Current Medications:  Current Facility-Administered Medications  Medication Dose Route Frequency Provider Last Rate Last Admin   acetaminophen  (TYLENOL ) tablet 650 mg  650 mg Oral Q6H PRN Coleman, Carolyn H, NP       alum & mag hydroxide-simeth (MAALOX/MYLANTA) 200-200-20 MG/5ML suspension 30 mL  30 mL Oral Q4H PRN Coleman, Carolyn H, NP       docusate sodium  (COLACE) capsule 100 mg  100 mg Oral Daily Jadapalle, Sree, MD   100 mg at 05/04/24 0912   fluPHENAZine  (PROLIXIN ) tablet 10 mg  10 mg Oral BID Shrivastava, Aryendra, MD   10 mg at 05/04/24 9256   Or   fluPHENAZine  (PROLIXIN ) injection 5 mg  5 mg Intramuscular BID Shrivastava, Aryendra, MD       magnesium  hydroxide (MILK OF MAGNESIA) suspension 30 mL  30 mL Oral Daily PRN Mardy Elveria DEL, NP       OLANZapine  (ZYPREXA ) injection 5 mg  5 mg Intramuscular TID PRN Mardy Elveria DEL, NP   5 mg at 04/12/24 2230   OLANZapine  zydis (ZYPREXA ) disintegrating tablet 5 mg  5 mg Oral TID PRN Coleman, Carolyn H, NP       polyethylene glycol (MIRALAX  / GLYCOLAX ) packet 17 g  17 g Oral Daily PRN Jadapalle, Sree, MD   17 g at 05/04/24 9077   PTA Medications: Medications Prior to Admission  Medication Sig Dispense Refill Last Dose/Taking   docusate sodium  (COLACE) 100 MG capsule Take 100 mg by mouth daily.      omega-3 acid ethyl esters (LOVAZA) 1 g capsule Take 1 g by mouth daily.       Patient Stressors: Traumatic event    Patient Strengths: Communication skills   Treatment Modalities: Medication Management, Group therapy, Case management,  1 to 1 session with clinician, Psychoeducation, Recreational therapy.   Physician Treatment Plan for Primary Diagnosis: Delusional disorder [facility]) Long Term  Goal(s): Improvement in symptoms so as ready for discharge   Short Term Goals: Ability to identify changes in lifestyle to reduce recurrence of condition will improve Ability to verbalize feelings will improve Ability to disclose and discuss suicidal ideas Ability to demonstrate self-control will improve Ability to identify and develop effective coping behaviors will improve  Medication Management: Evaluate patient's response, side effects, and tolerance of medication regimen.  Therapeutic Interventions: 1 to 1 sessions, Unit Group sessions and Medication administration.  Evaluation of Outcomes: Progressing  Physician Treatment Plan for Secondary Diagnosis: Principal Problem:   Delusional disorder (HCC)  Long Term Goal(s): Improvement in symptoms so as ready for discharge   Short Term Goals: Ability to identify changes in lifestyle to reduce recurrence of condition will improve Ability to verbalize feelings will improve Ability to disclose and discuss suicidal ideas Ability to demonstrate self-control will improve Ability to identify and develop effective coping behaviors will improve     Medication Management: Evaluate patient's response, side effects, and tolerance of medication regimen.  Therapeutic Interventions: 1 to 1 sessions, Unit Group sessions and Medication administration.  Evaluation of Outcomes: Progressing   RN Treatment Plan for Primary Diagnosis: Delusional disorder (HCC) Long Term Goal(s): Knowledge of disease and therapeutic regimen to maintain health will improve  Short Term Goals: Ability to remain free from injury will improve, Ability to verbalize frustration and anger appropriately  will improve, Ability to demonstrate self-control, Ability to participate in decision making will improve, Ability to verbalize feelings will improve, Ability to disclose and discuss suicidal ideas, Ability to identify and develop effective coping behaviors will improve, and  Compliance with prescribed medications will improve  Medication Management: RN will administer medications as ordered by provider, will assess and evaluate patient's response and provide education to patient for prescribed medication. RN will report any adverse and/or side effects to prescribing provider.  Therapeutic Interventions: 1 on 1 counseling sessions, Psychoeducation, Medication administration, Evaluate responses to treatment, Monitor vital signs and CBGs as ordered, Perform/monitor CIWA, COWS, AIMS and Fall Risk screenings as ordered, Perform wound care treatments as ordered.  Evaluation of Outcomes: Progressing   LCSW Treatment Plan for Primary Diagnosis: Delusional disorder [name]) Long Term Goal(s): Safe transition to appropriate next level of care at discharge, Engage patient in therapeutic group addressing interpersonal concerns.  Short Term Goals: Engage patient in aftercare planning with referrals and resources, Increase social support, Increase ability to appropriately verbalize feelings, Increase emotional regulation, Facilitate acceptance of mental health diagnosis and concerns, Facilitate patient progression through stages of change regarding substance use diagnoses and concerns, Identify triggers associated with mental health/substance abuse issues, and Increase skills for wellness and recovery  Therapeutic Interventions: Assess for all discharge needs, 1 to 1 time with Social worker, Explore available resources and support systems, Assess for adequacy in community support network, Educate family and significant other(s) on suicide prevention, Complete Psychosocial Assessment, Interpersonal group therapy.  Evaluation of Outcomes: Progressing   Progress in Treatment: Attending groups: Yes. Participating in groups: Yes. Taking medication as prescribed: Yes. Toleration medication: Yes. Family/Significant other contact made: Yes, individual(s) contacted:  Benjamin [name],  [phone]. Patient understands diagnosis: No. Discussing patient identified problems/goals with staff: Yes. Medical problems stabilized or resolved: Yes. Denies suicidal/homicidal ideation: Yes. Issues/concerns per patient self-inventory: Yes. Other:    New Short Term/Long Term Goal(s): elimination of symptoms of psychosis, medication management for mood stabilization; elimination of SI thoughts; development of comprehensive mental wellness plan. Update 04/02/24: No changes at this time. Update 04/07/24: No changes at this time. Update 04/12/24: No changes at this time Update 04/18/24: No changes at this time  Update 04/23/24: No changes at this time. Update 04/28/2024: No changes at this time.  Update 05/04/2024: No changes at this time.   Patient Goals:  Get out of here, I want to get healed up so I can get out of here Update 04/02/24: No changes at this time. Update 04/07/24: No changes at this time .Update 04/12/24: No changes at this time Update 04/18/24: No changes at this time  Update 04/23/24: No changes at this time. Update 04/28/2024: No changes at this time. Update 05/04/2024: No changes at this time.   Discharge Plan or Barriers: CSW will assist with appropriate discharge planning  Update 04/02/24: No changes at this time. Update 04/07/24: No changes at this time.Update 04/12/24: No changes at this time Update 04/18/24: CSW submitted report to APS. Care team looking at guardianship for pt at this time  Update 04/23/24: No changes at this time. Update 04/28/2024: No changes at this time. Update 05/04/2024: No changes at this time.   Reason for Continuation of Hospitalization: Delusions  Medication stabilization   Estimated Length of Stay: 1 to 7 days Update 04/02/24: TBD. Update 04/07/24: TBD Update 04/12/24:TBD. Update 04/28/24:TBD Update 05/04/2024: TBD  Last 3 [place] Suicide Severity Risk Score: Flowsheet Row Admission (Current) from 03/27/2024 in [name]  ED from 03/26/2024 in [facility] Emergency Department at [facility] ED from 03/25/2024 in [facility]  C-SSRS RISK CATEGORY No Risk No Risk No Risk    Last [facility] 2/9 Scores:     No data to display          Scribe for Treatment Team: [name], ISRAEL 05/04/2024 1:46 PM

## 2024-05-04 NOTE — Group Note (Signed)
 Date:  05/04/2024 Time:  8:29 PM  Group Topic/Focus:  Wrap-Up Group:   The focus of this group is to help patients review their daily goal of treatment and discuss progress on daily workbooks.    Participation Level:  Active  Participation Quality:  Appropriate  Affect:  Appropriate  Cognitive:  Appropriate  Insight: Good  Engagement in Group:  Engaged  Modes of Intervention:  Discussion  Additional Comments:    Benjamin [name] 05/04/2024, 8:29 PM

## 2024-05-04 NOTE — Group Note (Signed)
 Recreation Therapy Group Note   Group Topic:Communication  Group Date: 05/04/2024 Start Time: 1510 End Time: 1600 Facilitators: Celestia Jeoffrey BRAVO, LRT, CTRS Location: Courtyard  Group Description: Emotional Check in. Patient sat and talked with LRT about how they are doing and whatever else is on their mind. LRT provided active listening, reassurance and encouragement. Pts were given the opportunity to listen to music or color mandalas while they talk.    Goal Area(s) Addressed: Patient will engage in conversation with LRT. Patient will communicate their wants, needs, or questions.  Patient will practice a new coping skill of "talking to someone".  Affect/Mood: N/A   Participation Level: Did not attend    Clinical Observations/Individualized Feedback: Patient did not attend group.   Plan: Continue to engage patient in RT group sessions 2-3x/week.   Jeoffrey BRAVO Celestia, LRT, CTRS 05/04/2024 4:33 PM

## 2024-05-04 NOTE — Group Note (Signed)
 Date:  05/04/2024 Time:  10:48 AM  Group Topic/Focus:  Self Care:   The focus of this group is to help patients understand the importance of self-care in order to improve or restore emotional, physical, spiritual, interpersonal, and financial health.    Participation Level:  Did Not Attend  Participation Quality:  Did not attend   Affect:  Did not attend   Cognitive:  Did not attend   Insight: None  Engagement in Group:  Did not attend  Modes of Intervention:  Did not attend  Additional Comments:    Leigh VEAR Pais 05/04/2024, 10:48 AM

## 2024-05-04 NOTE — Group Note (Signed)
 Physical/Occupational Therapy Group Note  Group Topic: Neurographic Art  Group Date: 05/04/2024 Start Time: 1305 End Time: 1350 Facilitators: Clive Warren CROME, OT   Group Description: Group participated with Neurographic art activity, using watercolor paints to facilitate creative expression and meditation/relaxation for each individual.  Incorporated bimanual coordination, mental focus, emotional processing, task/command following and relaxation techniques as appropriate.  Patients engaged socially with therapist and other group participants throughout session. Allowed to ask questions as appropriate, and encouraged to identify ways they could use/share their creations with themselves and others.  Therapeutic Goal(s):  Demonstrate ability to independently manipulate utensils required to participate with and complete activity. Demonstrate ability to cognitively focus on task and follow commands necessary for completion. Demonstrate use of art as an outlet for emotional processing and expression. Identify and demonstrate importance of relaxation, neural calming and meditation for improved participation with life groups.  Individual Participation: Pt pleasant, participatory throughout, however had difficulty staying on task despite cues. Engaged in group discussion with prompting.   Participation Level: Moderate   Participation Quality: Moderate Cues   Behavior: Alert, Distracted, Off-task, Passive, and Reserved   Speech/Thought Process: Organized and Relevant   Affect/Mood: Flat   Insight: Poor   Judgement: Poor   Modes of Intervention: Activity, Clarification, Discussion, Education, Exploration, Problem-solving, Rapport Building, Socialization, and Support  Patient Response to Interventions:  Engaged   Plan: Continue to engage patient in PT/OT groups 1 - 2x/week.  Jillian Warth R., MPH, MS, OTR/L ascom [phone] 05/04/24, 3:07 PM

## 2024-05-04 NOTE — Progress Notes (Signed)
 [facility] MD Progress Note  05/04/2024 12:26 PM Benjamin [name]  MRN:  [id number] Benjamin [name] is a 78 year old male who presents to the inpatient geriatric psych unit after jumping from a two story building. Patient was originally seen at [facility] who referred him to the ED who admitted him here on 03/27/2024. Patient reports that people from the [place] gang were trying to kill him and the only escape was through the bedroom window on the second floor. He currently lives in the house with Benjamin [name] and his family and has been living with them for 3-4 years without any problems. On 03/19/2024 he went to the doctor and when he got back home the family wouldn't let him leave. He states that after he realized the gang was going to kill him he barricaded the door with his bed and jumped out the window. When he landed he ran away until one of his neighbors found him and called 911.   Subjective: Chart is reviewed and discussed with the treatment team.  On interview patient is noted to be resting in the bed.  He offers no complaints.  He denies having any problems with the staff members.  He has fair appetite and sleep.  He denies SI/HI/plan and denies hallucinations.   Able to understand medical problem Patient is unable to display any understanding of his medical problems or the current presentation leading up to admission Able to understand proposed treatment  Patient is unable to understand the proposed treatment with antipsychotics Patient remains paranoid and has cognitive impairment as he is unable to discuss his discharge planning.  Given his paranoia and delay in executive function patient is unable to make decisions regarding the treatment and disposition.    Sleep: Fair  Appetite:  Fair  Past Psychiatric History: see h&P  Family History:  Family History  Problem Relation Age of Onset   Colon cancer Neg Hx    Colon polyps Neg Hx    Stomach cancer Neg Hx    Esophageal  cancer Neg Hx    Social History:  Social History   Substance and Sexual Activity  Alcohol Use No     Social History   Substance and Sexual Activity  Drug Use No    Social History   Socioeconomic History   Marital status: Single    Spouse name: Not on file   Number of children: Not on file   Years of education: Not on file   Highest education level: Not on file  Occupational History   Not on file  Tobacco Use   Smoking status: Former    Types: Cigarettes   Smokeless tobacco: Never  Vaping Use   Vaping status: Never Used  Substance and Sexual Activity   Alcohol use: No   Drug use: No   Sexual activity: Not on file  Other Topics Concern   Not on file  Social History Narrative   Not on file   Social Drivers of Health   Financial Resource Strain: Not on file  Food Insecurity: No Food Insecurity (03/27/2024)   Hunger Vital Sign    Worried About Running Out of Food in the Last Year: Never true    Ran Out of Food in the Last Year: Never true  Transportation Needs: No Transportation Needs (03/27/2024)   PRAPARE - Administrator, Civil Service (Medical): No    Lack of Transportation (Non-Medical): No  Physical Activity: Not on file  Stress: Not on file  Social Connections: Moderately Integrated (03/27/2024)   Social Connection and Isolation Panel    Frequency of Communication with Friends and Family: Twice a week    Frequency of Social Gatherings with Friends and Family: Once a week    Attends Religious Services: 1 to 4 times per year    Active Member of [employer] or Organizations: No    Attends Engineer, structural: 1 to 4 times per year    Marital Status: Divorced   Past Medical History:  Past Medical History:  Diagnosis Date   Allergy    Arthritis    back   Prostate cancer (HCC) dx'd 2010   surg only    Past Surgical History:  Procedure Laterality Date   COLONOSCOPY     EYE SURGERY     PROSTATE SURGERY      Current Medications: Current  Facility-Administered Medications  Medication Dose Route Frequency Provider Last Rate Last Admin   acetaminophen  (TYLENOL ) tablet 650 mg  650 mg Oral Q6H PRN Coleman, Carolyn H, NP       alum & mag hydroxide-simeth (MAALOX/MYLANTA) 200-200-20 MG/5ML suspension 30 mL  30 mL Oral Q4H PRN Coleman, Carolyn H, NP       docusate sodium  (COLACE) capsule 100 mg  100 mg Oral Daily Jakota Manthei, MD   100 mg at 05/04/24 9087   fluPHENAZine  (PROLIXIN ) tablet 10 mg  10 mg Oral BID Shrivastava, Aryendra, MD   10 mg at 05/04/24 9256   Or   fluPHENAZine  (PROLIXIN ) injection 5 mg  5 mg Intramuscular BID Shrivastava, Aryendra, MD       magnesium  hydroxide (MILK OF MAGNESIA) suspension 30 mL  30 mL Oral Daily PRN Mardy Elveria DEL, NP       OLANZapine  (ZYPREXA ) injection 5 mg  5 mg Intramuscular TID PRN Mardy Elveria DEL, NP   5 mg at 04/12/24 2230   OLANZapine  zydis (ZYPREXA ) disintegrating tablet 5 mg  5 mg Oral TID PRN Mardy Elveria DEL, NP       polyethylene glycol (MIRALAX  / GLYCOLAX ) packet 17 g  17 g Oral Daily PRN Donnelly Mellow, MD   17 g at 05/04/24 9077    Lab Results:  No results found for this or any previous visit (from the past 48 hours).   Blood Alcohol level:  Lab Results  Component Value Date   [name] <15 03/26/2024    Metabolic Disorder Labs: Lab Results  Component Value Date   HGBA1C 5.6 04/02/2024   MPG 114 04/02/2024   No results found for: PROLACTIN Lab Results  Component Value Date   CHOL 146 04/02/2024   TRIG 37 04/02/2024   HDL 43 04/02/2024   CHOLHDL 3.4 04/02/2024   VLDL 7 04/02/2024   LDLCALC 96 04/02/2024    Physical Findings: AIMS:  , ,  ,  ,    CIWA:    COWS:      Psychiatric Specialty Exam:  Presentation  General Appearance:  Appropriate for Environment; Bizarre  Eye Contact: Fair  Speech: Clear and Coherent  Speech Volume: Normal    Mood and Affect  Mood: Anxious  Affect: Congruent   Thought Process  Thought  Processes: Disorganized  Descriptions of Associations:Intact  Orientation:Full (Time, Place and Person)  Thought Content: Paranoia at baseline Hallucinations: Denies  Ideas of Reference: Paranoia at baseline Suicidal Thoughts: Denies  Homicidal Thoughts: Denies   Sensorium  Memory: Immediate Fair; Recent Fair; Remote Fair  Judgment: Impaired  InsightHydrologist  Concentration: Fair  Attention Span: Fair  Recall: [employer] of Knowledge: Fair  Language: Fair   Psychomotor Activity  Psychomotor Activity: No data recorded  Musculoskeletal: Strength & Muscle Tone: within normal limits Gait & Station: normal Assets  Assets: Manufacturing systems engineer; Resilience    Physical Exam: Physical Exam Vitals and nursing note reviewed.    ROS Blood pressure 119/79, pulse 66, temperature 98.4 F (36.9 C), resp. rate 18, height 6' (1.829 m), weight 100 kg, SpO2 100%. Body mass index is 29.91 kg/m.  Diagnosis: Principal Problem:   Delusional disorder [facility])    Clinical Decision Making: Patient currently admitted after jumping off a two-story building in the context of possible delusions as reported as the people in the house he lived for 4 years trying to kill him.  Patient needs to be monitored closely for ongoing psychosis and paranoid delusions.  Given patient's confusion and chronic paranoia, no family support, APS report has been made and it got screened in for further evaluation by APS.   Differential diagnosis include: unspecified psychosis, delusion disorder, dementia, schizophrenia/schizoaffective disorder   Treatment Plan Summary: Requested the social work team to reach out to APS for safety concerns in the community as patient has chronic paranoia and confusion and is currently homeless patient remains confused and paranoid and spite of being compliant with medications.  Patient displays cognitive delay and deficits in executive  function like planning, organizing and processing information.  At this point patient lacks capacity to make medical decisions.  We recommend APS referral for possible legal guardianship as he has no family or legal next of kin to help with decision-making.   Safety and Monitoring:             -- InVoluntary admission to inpatient psychiatric unit for safety, stabilization and treatment             -- Daily contact with patient to assess and evaluate symptoms and progress in treatment             -- Patient's case to be discussed in multi-disciplinary team meeting             -- Observation Level: q15 minute checks             -- Vital signs:  q12 hours             -- Precautions: suicide, elopement, and assault   2. Psychiatric Diagnoses and Treatment:              Continue Prolixin  to 10 mg twice daily. Will discontinue Zyprexa .  -- The risks/benefits/side-effects/alternatives to this medication were discussed in detail with the patient and time was given for questions. The patient consents to medication trial.                -- Metabolic profile and EKG monitoring obtained while on an atypical antipsychotic (BMI: Lipid Panel: HbgA1c: QTc:)              -- Encouraged patient to participate in unit milieu and in scheduled group therapies                            3. Medical Issues Being Addressed:    No urgent medical needs noted  4. Discharge Planning:   -- Social work and case management to assist with discharge planning and identification of hospital follow-up needs prior to discharge  -- Estimated LOS: 3-4 days  Allyn Foil, MD 05/04/2024, 12:26 PM

## 2024-05-04 NOTE — Progress Notes (Signed)
 Patient requested something for constipation stating he hasn't had a bowel movement for 4 days.  Reminded patient that he refused the colace yesterday stating he had a bowel movement and didn't need the colace.  Offered milk of magnesia which he declined and wanted me to go to [facility] and get those little capsules that work.  Advised he was in the hospital and we needed to find something our pharmacy had. Offered miralax  which he agreed to take.  Once ordered it was administered.  Denies SI, HI, AVH, anxiety and depression.

## 2024-05-04 NOTE — Plan of Care (Signed)
   Problem: Health Behavior/Discharge Planning: Goal: Ability to manage health-related needs will improve Outcome: Progressing   Problem: Activity: Goal: Risk for activity intolerance will decrease Outcome: Progressing   Problem: Coping: Goal: Level of anxiety will decrease Outcome: Progressing

## 2024-05-05 DIAGNOSIS — F22 Delusional disorders: Secondary | ICD-10-CM | POA: Diagnosis not present

## 2024-05-05 NOTE — Plan of Care (Signed)
  Problem: Clinical Measurements: Goal: Ability to maintain clinical measurements within normal limits will improve Outcome: Progressing Goal: Will remain free from infection Outcome: Progressing   Problem: Safety: Goal: Ability to remain free from injury will improve Outcome: Progressing   Problem: Skin Integrity: Goal: Risk for impaired skin integrity will decrease Outcome: Progressing   Problem: Activity: Goal: Sleeping patterns will improve Outcome: Progressing   Problem: Health Behavior/Discharge Planning: Goal: Compliance with treatment plan for underlying cause of condition will improve Outcome: Progressing   Problem: Physical Regulation: Goal: Ability to maintain clinical measurements within normal limits will improve Outcome: Progressing   Problem: Safety: Goal: Periods of time without injury will increase Outcome: Progressing

## 2024-05-05 NOTE — Plan of Care (Signed)
   Problem: Activity: Goal: Interest or engagement in activities will improve Outcome: Progressing   Problem: Health Behavior/Discharge Planning: Goal: Compliance with treatment plan for underlying cause of condition will improve Outcome: Progressing   Problem: Safety: Goal: Periods of time without injury will increase Outcome: Progressing

## 2024-05-05 NOTE — Group Note (Signed)
 Date:  05/05/2024 Time:  4:31 PM  Group Topic/Focus:  Outside Rec/Activities The purpose of this group is for patients to go outside and get fresh air while doing some outside activities with peers.    Participation Level:  Active  Participation Quality:  Appropriate  Affect:  Appropriate  Cognitive:  Appropriate  Insight: Appropriate  Engagement in Group:  Engaged  Modes of Intervention:  Activity  Additional Comments:    Benjamin [name] 05/05/2024, 4:31 PM

## 2024-05-05 NOTE — Group Note (Signed)
 Date:  05/05/2024 Time:  6:32 PM  Group Topic/Focus:  Coping With Mental Health Crisis:   The purpose of this group is to help patients identify strategies for coping with mental health crisis.  Group discusses possible causes of crisis and ways to manage them effectively. Self Care:   The focus of this group is to help patients understand the importance of self-care in order to improve or restore emotional, physical, spiritual, interpersonal, and financial health. Wellness Toolbox:   The focus of this group is to discuss various aspects of wellness, balancing those aspects and exploring ways to increase the ability to experience wellness.  Patients will create a wellness toolbox for use upon discharge.    Participation Level:  Active  Participation Quality:  Appropriate  Affect:  Appropriate  Cognitive:  Appropriate  Insight: Appropriate  Engagement in Group:  Engaged  Modes of Intervention:  Activity and Discussion  Additional Comments:    Zani Kyllonen L Jsoeph Podesta 05/05/2024, 6:32 PM

## 2024-05-05 NOTE — Progress Notes (Signed)
 [facility] MD Progress Note  05/05/2024 5:14 PM Benjamin [name]  MRN:  [id number] Benjamin [name] is a 78 year old male who presents to the inpatient geriatric psych unit after jumping from a two story building. Patient was originally seen at [facility] who referred him to the ED who admitted him here on 03/27/2024. Patient reports that people from the [place] gang were trying to kill him and the only escape was through the bedroom window on the second floor. He currently lives in the house with [name] and his family and has been living with them for 3-4 years without any problems. On 03/19/2024 he went to the doctor and when he got back home the family wouldn't let him leave. He states that after he realized the gang was going to kill him he barricaded the door with his bed and jumped out the window. When he landed he ran away until one of his neighbors found him and called 911.   Subjective: Chart is reviewed and discussed with the treatment team.  Patient is noted to be sitting in the day area.  He offers no complaints.  He has not displayed any paranoia during the daytime.  He denies SI/HI/plan.  He is not displaying any confusion.  Per nursing staff patient is doing well during the morning shift.   Able to understand medical problem Patient is unable to display any understanding of his medical problems or the current presentation leading up to admission Able to understand proposed treatment  Patient is unable to understand the proposed treatment with antipsychotics Patient remains paranoid and has cognitive impairment as he is unable to discuss his discharge planning.  Given his paranoia and delay in executive function patient is unable to make decisions regarding the treatment and disposition.    Sleep: Fair  Appetite:  Fair  Past Psychiatric History: see h&P  Family History:  Family History  Problem Relation Age of Onset   Colon cancer Neg Hx    Colon polyps Neg Hx     Stomach cancer Neg Hx    Esophageal cancer Neg Hx    Social History:  Social History   Substance and Sexual Activity  Alcohol Use No     Social History   Substance and Sexual Activity  Drug Use No    Social History   Socioeconomic History   Marital status: Single    Spouse name: Not on file   Number of children: Not on file   Years of education: Not on file   Highest education level: Not on file  Occupational History   Not on file  Tobacco Use   Smoking status: Former    Types: Cigarettes   Smokeless tobacco: Never  Vaping Use   Vaping status: Never Used  Substance and Sexual Activity   Alcohol use: No   Drug use: No   Sexual activity: Not on file  Other Topics Concern   Not on file  Social History Narrative   Not on file   Social Drivers of Health   Financial Resource Strain: Not on file  Food Insecurity: No Food Insecurity (03/27/2024)   Hunger Vital Sign    Worried About Running Out of Food in the Last Year: Never true    Ran Out of Food in the Last Year: Never true  Transportation Needs: No Transportation Needs (03/27/2024)   PRAPARE - Administrator, Civil Service (Medical): No    Lack of Transportation (Non-Medical): No  Physical Activity: Not  on file  Stress: Not on file  Social Connections: Moderately Integrated (03/27/2024)   Social Connection and Isolation Panel    Frequency of Communication with Friends and Family: Twice a week    Frequency of Social Gatherings with Friends and Family: Once a week    Attends Religious Services: 1 to 4 times per year    Active Member of [employer] or Organizations: No    Attends Engineer, structural: 1 to 4 times per year    Marital Status: Divorced   Past Medical History:  Past Medical History:  Diagnosis Date   Allergy    Arthritis    back   Prostate cancer (HCC) dx'd 2010   surg only    Past Surgical History:  Procedure Laterality Date   COLONOSCOPY     EYE SURGERY     PROSTATE SURGERY       Current Medications: Current Facility-Administered Medications  Medication Dose Route Frequency Provider Last Rate Last Admin   acetaminophen  (TYLENOL ) tablet 650 mg  650 mg Oral Q6H PRN Coleman, Carolyn H, NP       alum & mag hydroxide-simeth (MAALOX/MYLANTA) 200-200-20 MG/5ML suspension 30 mL  30 mL Oral Q4H PRN Coleman, Carolyn H, NP       docusate sodium  (COLACE) capsule 100 mg  100 mg Oral Daily Tamel Abel, MD   100 mg at 05/05/24 0857   fluPHENAZine  (PROLIXIN ) tablet 10 mg  10 mg Oral BID Shrivastava, Aryendra, MD   10 mg at 05/05/24 0857   Or   fluPHENAZine  (PROLIXIN ) injection 5 mg  5 mg Intramuscular BID Shrivastava, Aryendra, MD       magnesium  hydroxide (MILK OF MAGNESIA) suspension 30 mL  30 mL Oral Daily PRN Mardy Elveria DEL, NP       OLANZapine  (ZYPREXA ) injection 5 mg  5 mg Intramuscular TID PRN Mardy Elveria DEL, NP   5 mg at 04/12/24 2230   OLANZapine  zydis (ZYPREXA ) disintegrating tablet 5 mg  5 mg Oral TID PRN Mardy Elveria DEL, NP       polyethylene glycol (MIRALAX  / GLYCOLAX ) packet 17 g  17 g Oral Daily PRN Donnelly Mellow, MD   17 g at 05/04/24 9077    Lab Results:  No results found for this or any previous visit (from the past 48 hours).   Blood Alcohol level:  Lab Results  Component Value Date   [facility] <15 03/26/2024    Metabolic Disorder Labs: Lab Results  Component Value Date   HGBA1C 5.6 04/02/2024   MPG 114 04/02/2024   No results found for: PROLACTIN Lab Results  Component Value Date   CHOL 146 04/02/2024   TRIG 37 04/02/2024   HDL 43 04/02/2024   CHOLHDL 3.4 04/02/2024   VLDL 7 04/02/2024   LDLCALC 96 04/02/2024    Physical Findings: AIMS:  , ,  ,  ,    CIWA:    COWS:      Psychiatric Specialty Exam:  Presentation  General Appearance:  Appropriate for Environment; Bizarre  Eye Contact: Fair  Speech: Clear and Coherent  Speech Volume: Normal    Mood and Affect   Mood: Anxious  Affect: Congruent   Thought Process  Thought Processes: Disorganized  Descriptions of Associations:Intact  Orientation:Full (Time, Place and Person)  Thought Content: Paranoia at baseline Hallucinations: Denies  Ideas of Reference: Paranoia at baseline Suicidal Thoughts: Denies  Homicidal Thoughts: Denies   Sensorium  Memory: Immediate Fair; Recent Fair; Remote Fair  Judgment:  Impaired  Insight: Shallow   Executive Functions  Concentration: Fair  Attention Span: Fair  Recall: [employer] of Knowledge: Fair  Language: Fair   Psychomotor Activity  Psychomotor Activity: No data recorded  Musculoskeletal: Strength & Muscle Tone: within normal limits Gait & Station: normal Assets  Assets: Manufacturing systems engineer; Resilience    Physical Exam: Physical Exam Vitals and nursing note reviewed.    ROS Blood pressure 111/83, pulse 70, temperature 98.2 F (36.8 C), resp. rate 16, height 6' (1.829 m), weight 100 kg, SpO2 97%. Body mass index is 29.91 kg/m.  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  Patient needs to be monitored closely for ongoing psychosis and paranoid delusions.  Given patient's confusion and chronic paranoia, no family support, APS report has been made and it got screened in for further evaluation by APS.   Differential diagnosis include: unspecified psychosis, delusion disorder, dementia, schizophrenia/schizoaffective disorder   Treatment Plan Summary: Requested the social work team to reach out to APS for safety concerns in the community as patient has chronic paranoia and confusion and is currently homeless patient remains confused and paranoid and spite of being compliant with  medications.  Patient displays cognitive delay and deficits in executive function like planning, organizing and processing information.  At this point patient lacks capacity to make medical decisions.  We recommend APS referral for possible legal guardianship as he has no family or legal next of kin to help with decision-making.   Safety and Monitoring:             -- InVoluntary admission to inpatient psychiatric unit for safety, stabilization and treatment             -- Daily contact with patient to assess and evaluate symptoms and progress in treatment             -- Patient's case to be discussed in multi-disciplinary team meeting             -- Observation Level: q15 minute checks             -- Vital signs:  q12 hours             -- Precautions: suicide, elopement, and assault   2. Psychiatric Diagnoses and Treatment:              Continue Prolixin  to 10 mg twice daily. Will discontinue Zyprexa .  -- The risks/benefits/side-effects/alternatives to this medication were discussed in detail with the patient and time was given for questions. The patient consents to medication trial.                -- Metabolic profile and EKG monitoring obtained while on an atypical antipsychotic (BMI: Lipid Panel: HbgA1c: QTc:)              -- Encouraged patient to participate in unit milieu and in scheduled group therapies                            3. Medical Issues Being Addressed:    No urgent medical needs noted  4. Discharge Planning:   -- Social work and case management to assist with discharge planning and identification of hospital follow-up needs prior  to discharge  -- Estimated LOS: 3-4 days  Keerthana Vanrossum, MD 05/05/2024, 5:14 PM

## 2024-05-05 NOTE — Progress Notes (Signed)
   05/04/24 2100  Psych Admission Type (Psych Patients Only)  Admission Status Involuntary  Psychosocial Assessment  Patient Complaints None  Eye Contact Fair  Facial Expression Animated  Affect Appropriate to circumstance  Speech Soft  Interaction Assertive  Motor Activity Slow  Appearance/Hygiene In scrubs  Behavior Characteristics Cooperative  Mood Pleasant  Thought Process  Coherency WDL  Content WDL  Delusions None reported or observed  Perception WDL  Hallucination None reported or observed  Judgment Impaired  Confusion Mild  Danger to Self  Current suicidal ideation? Denies

## 2024-05-05 NOTE — Group Note (Signed)
 Date:  05/05/2024 Time:  8:45 PM  Group Topic/Focus:  Making Healthy Choices:   The focus of this group is to help patients identify negative/unhealthy choices they were using prior to admission and identify positive/healthier coping strategies to replace them upon discharge.  MHT made introductions. MHT discussed expectations on the unit. MHT informed patients that techs would be checking in throughout the night and not to be alarmed if they see someone looking in. MHT asked patients about concerns on the unit. MHT discussed aftercare plans and informed of resources in the community. MHT encouraged continued treatment, informing of [facility]'s mental health services as well as calling their respective insurance companies for healthcare providers in their network. MHT provided supportive counseling to address questions on how to get linked with mental health services.  Participation Level:  Active  Participation Quality:  Appropriate  Affect:  Appropriate  Cognitive:  Appropriate  Insight: Appropriate  Engagement in Group:  Engaged  Modes of Intervention:  Discussion  Additional Comments:    Francis JONETTA Boos 05/05/2024, 8:45 PM

## 2024-05-05 NOTE — Progress Notes (Signed)
 Mood/ affect:  Pleasant and cooperative.  No complaints.   Psych assessment:  No complaints.  Denies SI/HI and AVH.   Behavior:  Present in the milieu. Appropriate interaction with staff and peers.    Medication/ PRNs:  Compliant with scheduled medications.   Pain:  Denies.  15 min checks in place for safety.

## 2024-05-06 NOTE — Group Note (Signed)
 Date:  05/06/2024 Time:  10:27 PM  Group Topic/Focus:  Wrap-Up Group:   The focus of this group is to help patients review their daily goal of treatment and discuss progress on daily workbooks.    Participation Level:  Active  Participation Quality:  Appropriate  Affect:  Appropriate  Cognitive:  Alert  Insight: Appropriate  Engagement in Group:  Engaged  Modes of Intervention:  Discussion  Additional Comments:    [name] 05/06/2024, 10:27 PM

## 2024-05-06 NOTE — Progress Notes (Signed)
   05/05/24 2100  Psych Admission Type (Psych Patients Only)  Admission Status Involuntary  Psychosocial Assessment  Patient Complaints None  Eye Contact Fair  Facial Expression Animated  Affect Appropriate to circumstance  Speech Soft  Interaction Assertive  Motor Activity Slow  Appearance/Hygiene In scrubs  Behavior Characteristics Cooperative  Mood Pleasant  Thought Process  Coherency WDL  Content WDL  Delusions None reported or observed  Perception WDL  Hallucination None reported or observed  Judgment Impaired  Confusion Mild  Danger to Self  Current suicidal ideation? Denies

## 2024-05-06 NOTE — Progress Notes (Signed)
 Mood:  Pleasant and cooperative. No behavioral issues.   Psych assessment:  Denies SI/HI and AVH.    Interaction / Group attendance:  Present in the milieu for meals and both groups.  Appropriate interaction with peers.    Medication/ PRNs:  Compliant with scheduled medications.    Pain: Denies  15 min checks in place for safety.

## 2024-05-06 NOTE — Group Note (Signed)
 Date:  05/06/2024 Time:  11:06 AM  Group Topic/Focus:  Goals/Bingo Group:   The focus of this group is to help patients establish daily goals to achieve during treatment and discuss how the patient can incorporate goal setting into their daily lives to aide in recovery. Pts also played bingo and won small prizes     Participation Level:  Active  Participation Quality:  Appropriate  Affect:  Appropriate  Cognitive:  Appropriate  Insight: Appropriate  Engagement in Group:  Engaged  Modes of Intervention:  Activity and Discussion  Additional Comments:    Benjamin [name] 05/06/2024, 11:06 AM

## 2024-05-06 NOTE — Plan of Care (Signed)

## 2024-05-06 NOTE — Progress Notes (Signed)
 [facility] MD Progress Note  05/06/2024 1:40 PM Sharif Rendell  MRN:  [id number] Benjamin [name] is a 78 year old male who presents to the inpatient geriatric psych unit after jumping from a two story building. Patient was originally seen at [facility] who referred him to the ED who admitted him here on 03/27/2024. Patient reports that people from the [place] gang were trying to kill him and the only escape was through the bedroom window on the second floor. He currently lives in the house with [name] and his family and has been living with them for 3-4 years without any problems. On 03/19/2024 he went to the doctor and when he got back home the family wouldn't let him leave. He states that after he realized the gang was going to kill him he barricaded the door with his bed and jumped out the window. When he landed he ran away until one of his neighbors found him and called 911.   Subjective: Chart is reviewed and discussed with the treatment team.  On interview patient offers no complaints and is noted to be resting in bed.  Per nursing patient response waiting in groups and is taking his medications with no problems.  Patient is very minimal in interview but denies having any safety concerns.  He denies SI/HI/plan and denies hallucinations.  Patient is currently awaiting placement from APS.     Able to understand medical problem Patient is unable to display any understanding of his medical problems or the current presentation leading up to admission Able to understand proposed treatment  Patient is unable to understand the proposed treatment with antipsychotics Patient remains paranoid and has cognitive impairment as he is unable to discuss his discharge planning.  Given his paranoia and delay in executive function patient is unable to make decisions regarding the treatment and disposition.    Sleep: Fair  Appetite:  Fair  Past Psychiatric History: see h&P  Family History:   Family History  Problem Relation Age of Onset   Colon cancer Neg Hx    Colon polyps Neg Hx    Stomach cancer Neg Hx    Esophageal cancer Neg Hx    Social History:  Social History   Substance and Sexual Activity  Alcohol Use No     Social History   Substance and Sexual Activity  Drug Use No    Social History   Socioeconomic History   Marital status: Single    Spouse name: Not on file   Number of children: Not on file   Years of education: Not on file   Highest education level: Not on file  Occupational History   Not on file  Tobacco Use   Smoking status: Former    Types: Cigarettes   Smokeless tobacco: Never  Vaping Use   Vaping status: Never Used  Substance and Sexual Activity   Alcohol use: No   Drug use: No   Sexual activity: Not on file  Other Topics Concern   Not on file  Social History Narrative   Not on file   Social Drivers of Health   Financial Resource Strain: Not on file  Food Insecurity: No Food Insecurity (03/27/2024)   Hunger Vital Sign    Worried About Running Out of Food in the Last Year: Never true    Ran Out of Food in the Last Year: Never true  Transportation Needs: No Transportation Needs (03/27/2024)   PRAPARE - Administrator, Civil Service (Medical): No  Lack of Transportation (Non-Medical): No  Physical Activity: Not on file  Stress: Not on file  Social Connections: Moderately Integrated (03/27/2024)   Social Connection and Isolation Panel    Frequency of Communication with Friends and Family: Twice a week    Frequency of Social Gatherings with Friends and Family: Once a week    Attends Religious Services: 1 to 4 times per year    Active Member of [employer] or Organizations: No    Attends Engineer, structural: 1 to 4 times per year    Marital Status: Divorced   Past Medical History:  Past Medical History:  Diagnosis Date   Allergy    Arthritis    back   Prostate cancer (HCC) dx'd 2010   surg only    Past  Surgical History:  Procedure Laterality Date   COLONOSCOPY     EYE SURGERY     PROSTATE SURGERY      Current Medications: Current Facility-Administered Medications  Medication Dose Route Frequency Provider Last Rate Last Admin   acetaminophen  (TYLENOL ) tablet 650 mg  650 mg Oral Q6H PRN Coleman, Carolyn H, NP       alum & mag hydroxide-simeth (MAALOX/MYLANTA) 200-200-20 MG/5ML suspension 30 mL  30 mL Oral Q4H PRN Coleman, Carolyn H, NP       docusate sodium  (COLACE) capsule 100 mg  100 mg Oral Daily Xavior Niazi, MD   100 mg at 05/06/24 0809   fluPHENAZine  (PROLIXIN ) tablet 10 mg  10 mg Oral BID Shrivastava, Aryendra, MD   10 mg at 05/06/24 0809   Or   fluPHENAZine  (PROLIXIN ) injection 5 mg  5 mg Intramuscular BID Shrivastava, Aryendra, MD       magnesium  hydroxide (MILK OF MAGNESIA) suspension 30 mL  30 mL Oral Daily PRN Mardy Elveria DEL, NP       OLANZapine  (ZYPREXA ) injection 5 mg  5 mg Intramuscular TID PRN Mardy Elveria DEL, NP   5 mg at 04/12/24 2230   OLANZapine  zydis (ZYPREXA ) disintegrating tablet 5 mg  5 mg Oral TID PRN Mardy Elveria DEL, NP       polyethylene glycol (MIRALAX  / GLYCOLAX ) packet 17 g  17 g Oral Daily PRN Donnelly Mellow, MD   17 g at 05/04/24 9077    Lab Results:  No results found for this or any previous visit (from the past 48 hours).   Blood Alcohol level:  Lab Results  Component Value Date   [facility] <15 03/26/2024    Metabolic Disorder Labs: Lab Results  Component Value Date   HGBA1C 5.6 04/02/2024   MPG 114 04/02/2024   No results found for: PROLACTIN Lab Results  Component Value Date   CHOL 146 04/02/2024   TRIG 37 04/02/2024   HDL 43 04/02/2024   CHOLHDL 3.4 04/02/2024   VLDL 7 04/02/2024   LDLCALC 96 04/02/2024    Physical Findings: AIMS:  , ,  ,  ,    CIWA:    COWS:      Psychiatric Specialty Exam:  Presentation  General Appearance:  Appropriate for Environment; Bizarre  Eye Contact: Fair  Speech: Clear and  Coherent  Speech Volume: Normal    Mood and Affect  Mood: Anxious  Affect: Congruent   Thought Process  Thought Processes: Disorganized  Descriptions of Associations:Intact  Orientation:Full (Time, Place and Person)  Thought Content: Paranoia at baseline Hallucinations: Denies  Ideas of Reference: Paranoia at baseline Suicidal Thoughts: Denies  Homicidal Thoughts: Denies   Sensorium  Memory: Immediate Fair; Recent Fair; Remote Fair  Judgment: Impaired  Insight: Shallow   Executive Functions  Concentration: Fair  Attention Span: Fair  Recall: [employer] of Knowledge: Fair  Language: Fair   Psychomotor Activity  Psychomotor Activity: No data recorded  Musculoskeletal: Strength & Muscle Tone: within normal limits Gait & Station: normal Assets  Assets: Manufacturing systems engineer; Resilience    Physical Exam: Physical Exam Vitals and nursing note reviewed.    ROS Blood pressure 107/78, pulse 72, temperature 98.1 F (36.7 C), resp. rate 18, height 6' (1.829 m), weight 100 kg, SpO2 97%. Body mass index is 29.91 kg/m.  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  Patient needs to be monitored closely for ongoing psychosis and paranoid delusions.  Given patient's confusion and chronic paranoia, no family support, APS report has been made and it got screened in for further evaluation by APS.   Differential diagnosis include: unspecified psychosis, delusion disorder, dementia, schizophrenia/schizoaffective disorder   Treatment Plan Summary: Requested the social work team to reach out to APS for safety concerns in the community as patient has chronic paranoia and confusion and is currently homeless patient remains  confused and paranoid and spite of being compliant with medications.  Patient displays cognitive delay and deficits in executive function like planning, organizing and processing information.  At this point patient lacks capacity to make medical decisions.  We recommend APS referral for possible legal guardianship as he has no family or legal next of kin to help with decision-making.   Safety and Monitoring:             -- InVoluntary admission to inpatient psychiatric unit for safety, stabilization and treatment             -- Daily contact with patient to assess and evaluate symptoms and progress in treatment             -- Patient's case to be discussed in multi-disciplinary team meeting             -- Observation Level: q15 minute checks             -- Vital signs:  q12 hours             -- Precautions: suicide, elopement, and assault   2. Psychiatric Diagnoses and Treatment:              Continue Prolixin  to 10 mg twice daily. Will discontinue Zyprexa .  -- The risks/benefits/side-effects/alternatives to this medication were discussed in detail with the patient and time was given for questions. The patient consents to medication trial.                -- Metabolic profile and EKG monitoring obtained while on an atypical antipsychotic (BMI: Lipid Panel: HbgA1c: QTc:)              -- Encouraged patient to participate in unit milieu and in scheduled group therapies                            3. Medical Issues Being Addressed:    No urgent medical needs noted  4. Discharge Planning:   -- Social work and case management to assist with  discharge planning and identification of hospital follow-up needs prior to discharge  -- Estimated LOS: 3-4 days  Brittish Bolinger, MD 05/06/2024, 1:40 PM

## 2024-05-06 NOTE — Plan of Care (Signed)
   Problem: Activity: Goal: Risk for activity intolerance will decrease Outcome: Progressing   Problem: Nutrition: Goal: Adequate nutrition will be maintained Outcome: Progressing   Problem: Coping: Goal: Level of anxiety will decrease Outcome: Progressing

## 2024-05-06 NOTE — Group Note (Signed)
 Date:  05/06/2024 Time:  5:17 PM  Group Topic/Focus:  Healthy Communication:   The focus of this group is to discuss communication, barriers to communication, as well as healthy ways to communicate with others.    Participation Level:  Active  Participation Quality:  Appropriate  Affect:  Appropriate  Cognitive:  Appropriate  Insight: Appropriate  Engagement in Group:  Engaged  Modes of Intervention:  Activity  Additional Comments:  N/A  [name] Gavel 05/06/2024, 5:17 PM

## 2024-05-07 DIAGNOSIS — F22 Delusional disorders: Secondary | ICD-10-CM | POA: Diagnosis not present

## 2024-05-07 NOTE — Group Note (Signed)
 Date:  05/07/2024 Time:  9:28 PM  Group Topic/Focus:  Wrap-Up Group:   The focus of this group is to help patients review their daily goal of treatment and discuss progress on daily workbooks.    Participation Level:  Active  Participation Quality:  Appropriate  Affect:  Appropriate  Cognitive:  Alert  Insight: Appropriate  Engagement in Group:  Engaged  Modes of Intervention:  Discussion  Additional Comments:    Benjamin [name] 05/07/2024, 9:28 PM

## 2024-05-07 NOTE — Group Note (Signed)
 Physical/Occupational Therapy Group Note  Group Topic: Yoga  Group Date: 05/07/2024 Start Time: 1305 End Time: 1335 Facilitators: Josi Roediger, Alm Hamilton, PT   Group Description: Group participated with series of yoga poses, designed to emphasize functional sitting balance, core stability, generalized flexibility and overall posture.  Incorporated deep breathing techniques with poses, working to promote relaxation, mindfulness and focus with targeted activities.   Discussed benefits of yoga in improving mood and self-esteem, reducing stress and anxiety, and promoting functional strength and balance for each participant.  Discussed ways to integrate into each participant's daily routine.  Provided handout with written and pictorial descriptions of included yoga movements to be utilized as appropriate outside of group time.  Therapeutic Goal(s):  Demonstrate safe ability to participate with yoga poses during group activity. Identify one benefit of participation with yoga poses as part of each participant's exercise/movement routine. Identify 1-2 individual poses that participant feels most beneficial to his/her needs and that he/she can easily replicate outside of group.  Individual Participation: Pt participated minimally with the discussion portion of the session but was engaged and active with the physical activity components.  Pt was able to modify several activities during the session to make them more comfortable with only minimal cuing.    Participation Level: Active and Engaged   Participation Quality: Minimal Cues   Behavior: Appropriate   Speech/Thought Process: Minimal verbal communication during the session    Affect/Mood: Appropriate   Insight: Good   Judgement: Good   Modes of Intervention: Activity, Discussion, and Education  Patient Response to Interventions:  Attentive, Engaged, and Interested    Plan: Continue to engage patient in PT/OT groups 1 - 2x/week.  CHARM Hamilton Bertin PT, DPT 05/07/24, 2:07 PM

## 2024-05-07 NOTE — Group Note (Signed)
 Recreation Therapy Group Note   Group Topic:General Recreation  Group Date: 05/07/2024 Start Time: 1535 End Time: 1630 Facilitators: Celestia Jeoffrey BRAVO, LRT, CTRS Location: Courtyard  Group Description: Outdoor Recreation. Patients had the option to play corn hole, ring toss, UNO, or listening to music while outside in the courtyard getting fresh air and sunlight. Patients helped water and prune the raised garden beds. LRT and patients discussed things that they enjoy doing in their free time outside of the hospital. LRT encouraged patients to drink water after being active and getting their heart rate up.   Goal Area(s) Addressed: Patient will identify leisure interests.  Patient will practice healthy decision making. Patient will engage in recreation activity.  Affect/Mood: Appropriate   Participation Level: Minimal    Clinical Observations/Individualized Feedback: Benjamin [name] was in and out of group multiple times.   Plan: Continue to engage patient in RT group sessions 2-3x/week.   Jeoffrey BRAVO Celestia, LRT, CTRS 05/07/2024 5:36 PM

## 2024-05-07 NOTE — Plan of Care (Signed)
  Problem: Education: Goal: Knowledge of General Education information will improve Description Including pain rating scale, medication(s)/side effects and non-pharmacologic comfort measures Outcome: Progressing   Problem: Clinical Measurements: Goal: Ability to maintain clinical measurements within normal limits will improve Outcome: Progressing Goal: Will remain free from infection Outcome: Progressing Goal: Diagnostic test results will improve Outcome: Progressing Goal: Respiratory complications will improve Outcome: Progressing Goal: Cardiovascular complication will be avoided Outcome: Progressing   Problem: Activity: Goal: Risk for activity intolerance will decrease Outcome: Progressing   Problem: Nutrition: Goal: Adequate nutrition will be maintained Outcome: Progressing   Problem: Coping: Goal: Level of anxiety will decrease Outcome: Progressing   Problem: Elimination: Goal: Will not experience complications related to bowel motility Outcome: Progressing Goal: Will not experience complications related to urinary retention Outcome: Progressing   Problem: Safety: Goal: Ability to remain free from injury will improve Outcome: Progressing   

## 2024-05-07 NOTE — Progress Notes (Signed)
   05/07/24 1300  Psych Admission Type (Psych Patients Only)  Admission Status Involuntary  Psychosocial Assessment  Patient Complaints None  Eye Contact Fair  Facial Expression Sullen  Affect Appropriate to circumstance  Speech Soft  Interaction Assertive  Motor Activity Slow  Appearance/Hygiene In scrubs  Behavior Characteristics Cooperative  Mood Pleasant  Thought Process  Coherency WDL  Content WDL  Delusions None reported or observed  Perception WDL  Hallucination None reported or observed  Judgment Impaired  Confusion Mild  Danger to Self  Current suicidal ideation? Denies

## 2024-05-07 NOTE — Group Note (Signed)
 Recreation Therapy Group Note   Group Topic:Relaxation  Group Date: 05/07/2024 Start Time: 1500 End Time: 1535 Facilitators: Celestia Jeoffrey BRAVO, LRT, CTRS Location: Dayroom  Group Description: PMR (Progressive Muscle Relaxation). LRT educates patients on what PMR is and the benefits that come from it. Patients are asked to sit with their feet flat on the floor while sitting up and all the way back in their chair, if possible. LRT and pts follow a prompt through a speaker that requires you to tense and release different muscles in their body and focus on their breathing. During session, lights are off and soft music is being played. Pts are given a stress ball to use if needed.   Goal Area(s) Addressed:  Patients will be able to describe progressive muscle relaxation.  Patient will practice using relaxation technique. Patient will identify a new coping skill.  Patient will follow multistep directions to reduce anxiety and stress.   Affect/Mood: Appropriate   Participation Level: Active and Engaged   Participation Quality: Independent   Behavior: Appropriate, Calm, and Cooperative   Speech/Thought Process: Coherent   Insight: Good   Judgement: Good   Modes of Intervention: Education and Exploration   Patient Response to Interventions:  Attentive, Engaged, and Receptive   Education Outcome:  Acknowledges education   Clinical Observations/Individualized Feedback: Benjamin [name] was active in their participation of session activities and group discussion. Pt completed all exercises as prompted.    Plan: Continue to engage patient in RT group sessions 2-3x/week.   Jeoffrey BRAVO Celestia, LRT, CTRS 05/07/2024 5:11 PM

## 2024-05-07 NOTE — Progress Notes (Signed)
 [facility] MD Progress Note  05/07/2024 1:35 PM Benjamin [name]  MRN:  [id number] Benjamin [name] is a 78 year old male who presents to the inpatient geriatric psych unit after jumping from a two story building. Patient was originally seen at [facility] who referred him to the ED who admitted him here on 03/27/2024. Patient reports that people from the [place] gang were trying to kill him and the only escape was through the bedroom window on the second floor. He currently lives in the house with [name] and his family and has been living with them for 3-4 years without any problems. On 03/19/2024 he went to the doctor and when he got back home the family wouldn't let him leave. He states that after he realized the gang was going to kill him he barricaded the door with his bed and jumped out the window. When he landed he ran away until one of his neighbors found him and called 911.   Subjective: Chart is reviewed and discussed with the treatment team.  Patient is noted to be resting in his room.  He offers no complaints.  He reports feeling safe on the unit.  Per nursing staff patient is participating in groups taking his medication and engaging in some of the games on the unit.  Patient denies any SI/HI/plan and denies hallucinations    Able to understand medical problem Patient is unable to display any understanding of his medical problems or the current presentation leading up to admission Able to understand proposed treatment  Patient is unable to understand the proposed treatment with antipsychotics Patient remains paranoid and has cognitive impairment as he is unable to discuss his discharge planning.  Given his paranoia and delay in executive function patient is unable to make decisions regarding the treatment and disposition.    Sleep: Fair  Appetite:  Fair  Past Psychiatric History: see h&P  Family History:  Family History  Problem Relation Age of Onset   Colon cancer Neg  Hx    Colon polyps Neg Hx    Stomach cancer Neg Hx    Esophageal cancer Neg Hx    Social History:  Social History   Substance and Sexual Activity  Alcohol Use No     Social History   Substance and Sexual Activity  Drug Use No    Social History   Socioeconomic History   Marital status: Single    Spouse name: Not on file   Number of children: Not on file   Years of education: Not on file   Highest education level: Not on file  Occupational History   Not on file  Tobacco Use   Smoking status: Former    Types: Cigarettes   Smokeless tobacco: Never  Vaping Use   Vaping status: Never Used  Substance and Sexual Activity   Alcohol use: No   Drug use: No   Sexual activity: Not on file  Other Topics Concern   Not on file  Social History Narrative   Not on file   Social Drivers of Health   Financial Resource Strain: Not on file  Food Insecurity: No Food Insecurity (03/27/2024)   Hunger Vital Sign    Worried About Running Out of Food in the Last Year: Never true    Ran Out of Food in the Last Year: Never true  Transportation Needs: No Transportation Needs (03/27/2024)   PRAPARE - Administrator, Civil Service (Medical): No    Lack of Transportation (Non-Medical):  No  Physical Activity: Not on file  Stress: Not on file  Social Connections: Moderately Integrated (03/27/2024)   Social Connection and Isolation Panel    Frequency of Communication with Friends and Family: Twice a week    Frequency of Social Gatherings with Friends and Family: Once a week    Attends Religious Services: 1 to 4 times per year    Active Member of [employer] or Organizations: No    Attends Engineer, structural: 1 to 4 times per year    Marital Status: Divorced   Past Medical History:  Past Medical History:  Diagnosis Date   Allergy    Arthritis    back   Prostate cancer (HCC) dx'd 2010   surg only    Past Surgical History:  Procedure Laterality Date   COLONOSCOPY     EYE  SURGERY     PROSTATE SURGERY      Current Medications: Current Facility-Administered Medications  Medication Dose Route Frequency Provider Last Rate Last Admin   acetaminophen  (TYLENOL ) tablet 650 mg  650 mg Oral Q6H PRN Coleman, Carolyn H, NP       alum & mag hydroxide-simeth (MAALOX/MYLANTA) 200-200-20 MG/5ML suspension 30 mL  30 mL Oral Q4H PRN Coleman, Carolyn H, NP       docusate sodium  (COLACE) capsule 100 mg  100 mg Oral Daily Narada Uzzle, MD   100 mg at 05/07/24 0850   fluPHENAZine  (PROLIXIN ) tablet 10 mg  10 mg Oral BID Shrivastava, Aryendra, MD   10 mg at 05/07/24 9149   Or   fluPHENAZine  (PROLIXIN ) injection 5 mg  5 mg Intramuscular BID Shrivastava, Aryendra, MD       magnesium  hydroxide (MILK OF MAGNESIA) suspension 30 mL  30 mL Oral Daily PRN Mardy Elveria DEL, NP       OLANZapine  (ZYPREXA ) injection 5 mg  5 mg Intramuscular TID PRN Mardy Elveria DEL, NP   5 mg at 04/12/24 2230   OLANZapine  zydis (ZYPREXA ) disintegrating tablet 5 mg  5 mg Oral TID PRN Mardy Elveria DEL, NP       polyethylene glycol (MIRALAX  / GLYCOLAX ) packet 17 g  17 g Oral Daily PRN Donnelly Mellow, MD   17 g at 05/04/24 9077    Lab Results:  No results found for this or any previous visit (from the past 48 hours).   Blood Alcohol level:  Lab Results  Component Value Date   [facility] <15 03/26/2024    Metabolic Disorder Labs: Lab Results  Component Value Date   HGBA1C 5.6 04/02/2024   MPG 114 04/02/2024   No results found for: PROLACTIN Lab Results  Component Value Date   CHOL 146 04/02/2024   TRIG 37 04/02/2024   HDL 43 04/02/2024   CHOLHDL 3.4 04/02/2024   VLDL 7 04/02/2024   LDLCALC 96 04/02/2024    Physical Findings: AIMS:  , ,  ,  ,    CIWA:    COWS:      Psychiatric Specialty Exam:  Presentation  General Appearance:  Appropriate for Environment; Bizarre  Eye Contact: Fair  Speech: Clear and Coherent  Speech Volume: Normal    Mood and Affect   Mood: Anxious  Affect: Congruent   Thought Process  Thought Processes: Disorganized  Descriptions of Associations:Intact  Orientation:Full (Time, Place and Person)  Thought Content: Paranoia at baseline Hallucinations: Denies  Ideas of Reference: Paranoia at baseline Suicidal Thoughts: Denies  Homicidal Thoughts: Denies   Sensorium  Memory: Immediate Fair; Recent  Fair; Remote Fair  Judgment: Impaired  Insight: Shallow   Executive Functions  Concentration: Fair  Attention Span: Fair  Recall: [employer] of Knowledge: Fair  Language: Fair   Psychomotor Activity  Psychomotor Activity: No data recorded  Musculoskeletal: Strength & Muscle Tone: within normal limits Gait & Station: normal Assets  Assets: Manufacturing systems engineer; Resilience    Physical Exam: Physical Exam Vitals and nursing note reviewed.    ROS Blood pressure 119/80, pulse 67, temperature 97.7 F (36.5 C), resp. rate 18, height 6' (1.829 m), weight 100 kg, SpO2 99%. Body mass index is 29.91 kg/m.  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  Patient needs to be monitored closely for ongoing psychosis and paranoid delusions.  Given patient's confusion and chronic paranoia, no family support, APS report has been made and it got screened in for further evaluation by APS.   Differential diagnosis include: unspecified psychosis, delusion disorder, dementia, schizophrenia/schizoaffective disorder   Treatment Plan Summary: Requested the social work team to reach out to APS for safety concerns in the community as patient has chronic paranoia and confusion and is currently homeless patient remains confused and paranoid and spite of being compliant with  medications.  Patient displays cognitive delay and deficits in executive function like planning, organizing and processing information.  At this point patient lacks capacity to make medical decisions.  We recommend APS referral for possible legal guardianship as he has no family or legal next of kin to help with decision-making.   Safety and Monitoring:             -- InVoluntary admission to inpatient psychiatric unit for safety, stabilization and treatment             -- Daily contact with patient to assess and evaluate symptoms and progress in treatment             -- Patient's case to be discussed in multi-disciplinary team meeting             -- Observation Level: q15 minute checks             -- Vital signs:  q12 hours             -- Precautions: suicide, elopement, and assault   2. Psychiatric Diagnoses and Treatment:              Continue Prolixin  to 10 mg twice daily. Will discontinue Zyprexa .  -- The risks/benefits/side-effects/alternatives to this medication were discussed in detail with the patient and time was given for questions. The patient consents to medication trial.                -- Metabolic profile and EKG monitoring obtained while on an atypical antipsychotic (BMI: Lipid Panel: HbgA1c: QTc:)              -- Encouraged patient to participate in unit milieu and in scheduled group therapies                            3. Medical Issues Being Addressed:    No urgent medical needs noted  4. Discharge Planning:   -- Social work and case management to assist with discharge planning and identification  of hospital follow-up needs prior to discharge  -- Estimated LOS: 3-4 days  Kassondra Geil, MD 05/07/2024, 1:35 PM

## 2024-05-07 NOTE — Group Note (Signed)
 Date:  05/07/2024 Time:  1:11 PM  Group Topic/Focus:  Building Self Esteem:   The Focus of this group is helping patients become aware of the effects of self-esteem on their lives, the things they and others do that enhance or undermine their self-esteem, seeing the relationship between their level of self-esteem and the choices they make and learning ways to enhance self-esteem.    Participation Level:  Active  Participation Quality:  Appropriate  Affect:  Appropriate  Cognitive:  Alert  Insight: Appropriate  Engagement in Group:  Engaged  Modes of Intervention:  Activity  Additional Comments:  N/A  [name] Gavel 05/07/2024, 1:11 PM

## 2024-05-08 DIAGNOSIS — F22 Delusional disorders: Secondary | ICD-10-CM | POA: Diagnosis not present

## 2024-05-08 NOTE — Group Note (Signed)
 Recreation Therapy Group Note   Group Topic:Leisure Education  Group Date: 05/08/2024 Start Time: 1500 End Time: 1600 Facilitators: Celestia Jeoffrey BRAVO, LRT, CTRS Location: Dayroom  Group Description: Bingo. Patients played multiple rounds of bingo. LRT and pts discussed the definition of leisure, things they do in their free time outside of the hospital, and how bingo is also a leisure activity. Pts received soda as their bingo prize.   Goal Area(s) Addressed:  Patient will identify a current leisure interest.  Patient will learn the definition of "leisure". Patient will have the opportunity to try a new leisure activity. Patient will communicate with peers and LRT.   Affect/Mood: Appropriate   Participation Level: Active and Engaged   Participation Quality: Independent   Behavior: Appropriate, Calm, and Cooperative   Speech/Thought Process: Coherent   Insight: Good   Judgement: Good   Modes of Intervention: Cooperative Play   Patient Response to Interventions:  Attentive, Engaged, Interested , and Receptive   Education Outcome:  Acknowledges education   Clinical Observations/Individualized Feedback: Daxx was active in their participation of session activities and group discussion. Pt won a Facilities manager. Pt interacted well with LRT and peers duration of session.    Plan: Continue to engage patient in RT group sessions 2-3x/week.   Jeoffrey BRAVO Celestia, LRT, CTRS 05/08/2024 5:26 PM

## 2024-05-08 NOTE — Progress Notes (Signed)
   05/08/24 1100  Psych Admission Type (Psych Patients Only)  Admission Status Involuntary  Psychosocial Assessment  Patient Complaints None  Eye Contact Fair  Facial Expression Worried  Affect Appropriate to circumstance  Speech Soft  Interaction Assertive  Motor Activity Slow  Appearance/Hygiene In scrubs  Behavior Characteristics Cooperative  Mood Pleasant  Thought Process  Coherency WDL  Content WDL  Delusions None reported or observed  Perception WDL  Hallucination None reported or observed  Judgment Impaired  Confusion Mild  Danger to Self  Current suicidal ideation? Denies  Agreement Not to Harm Self Yes  Description of Agreement verbal  Danger to Others  Danger to Others None reported or observed

## 2024-05-08 NOTE — Progress Notes (Signed)
 Estimated Sleeping Duration (Last 24 Hours): 7.75-9.50 hours   05/08/24 0049  Psych Admission Type (Psych Patients Only)  Admission Status Involuntary  Psychosocial Assessment  Patient Complaints None  Eye Contact Fair  Facial Expression Sullen  Affect Appropriate to circumstance  Speech Soft  Interaction Assertive  Motor Activity Slow  Appearance/Hygiene In scrubs  Behavior Characteristics Cooperative;Appropriate to situation  Mood Pleasant  Thought Process  Coherency WDL  Content WDL  Delusions None reported or observed  Perception WDL  Hallucination None reported or observed  Judgment Impaired  Confusion Mild  Danger to Self  Current suicidal ideation? Denies

## 2024-05-08 NOTE — Group Note (Signed)
 Recreation Therapy Group Note   Group Topic:General Recreation  Group Date: 05/08/2024 Start Time: 1100 End Time: 1130 Facilitators: Celestia Jeoffrey BRAVO, LRT, CTRS Location: Courtyard  Group Description: Outdoor Recreation. Patients had the option to play corn hole, ring toss, UNO, or listening to music while outside in the courtyard getting fresh air and sunlight. Patients helped water and prune the raised garden beds. LRT and patients discussed things that they enjoy doing in their free time outside of the hospital. LRT encouraged patients to drink water after being active and getting their heart rate up.   Goal Area(s) Addressed: Patient will identify leisure interests.  Patient will practice healthy decision making. Patient will engage in recreation activity.   Affect/Mood: N/A   Participation Level: Did not attend    Clinical Observations/Individualized Feedback: Patient did not attend group.   Plan: Continue to engage patient in RT group sessions 2-3x/week.   Jeoffrey BRAVO Celestia, LRT, CTRS 05/08/2024 1:43 PM

## 2024-05-08 NOTE — Plan of Care (Signed)

## 2024-05-08 NOTE — Group Note (Signed)
 Date:  05/08/2024 Time:  10:26 AM  Group Topic/Focus:  Self Esteem Action Plan:   The focus of this group is to help patients create a plan to continue to build self-esteem after discharge.    Participation Level:  Minimal  Participation Quality:  Appropriate  Affect:  Appropriate  Cognitive:  Alert  Insight: Appropriate  Engagement in Group:  Distracting  Modes of Intervention:  Activity  Additional Comments:  N/A  [name] Gavel 05/08/2024, 10:26 AM

## 2024-05-08 NOTE — Plan of Care (Signed)
   Problem: Nutrition: Goal: Adequate nutrition will be maintained Outcome: Progressing   Problem: Coping: Goal: Level of anxiety will decrease Outcome: Progressing

## 2024-05-08 NOTE — Group Note (Signed)
 Date:  05/08/2024 Time:  10:18 PM  Group Topic/Focus:  Wrap-Up Group:   The focus of this group is to help patients review their daily goal of treatment and discuss progress on daily workbooks.    Participation Level:  Active  Participation Quality:  Appropriate  Affect:  Appropriate  Cognitive:  Appropriate  Insight: Appropriate  Engagement in Group:  Engaged  Modes of Intervention:  Discussion  Additional Comments:    [name] 05/08/2024, 10:18 PM

## 2024-05-08 NOTE — Progress Notes (Signed)
 [facility] MD Progress Note  05/08/2024 9:32 PM [name]  MRN:  [id number] Benjamin [name] is a 78 year old male who presents to the inpatient geriatric psych unit after jumping from a two story building. Patient was originally seen at [facility] who referred him to the ED who admitted him here on 03/27/2024. Patient reports that people from the [place] gang were trying to kill him and the only escape was through the bedroom window on the second floor. He currently lives in the house with [name] and his family and has been living with them for 3-4 years without any problems. On 03/19/2024 he went to the doctor and when he got back home the family wouldn't let him leave. He states that after he realized the gang was going to kill him he barricaded the door with his bed and jumped out the window. When he landed he ran away until one of his neighbors found him and called 911.   Subjective: Chart is reviewed and discussed with the treatment team.  Patient is noted to be in his room and offers no complaints.  He denies SI/HI/plan.  Denies hallucinations.  Per nursing patient continues to do well on the unit but remains confused and displays cognitive impairment.   Able to understand medical problem Patient is unable to display any understanding of his medical problems or the current presentation leading up to admission Able to understand proposed treatment  Patient is unable to understand the proposed treatment with antipsychotics Patient remains paranoid and has cognitive impairment as he is unable to discuss his discharge planning.  Given his paranoia and delay in executive function patient is unable to make decisions regarding the treatment and disposition.    Sleep: Fair  Appetite:  Fair  Past Psychiatric History: see h&P  Family History:  Family History  Problem Relation Age of Onset   Colon cancer Neg Hx    Colon polyps Neg Hx    Stomach cancer Neg Hx    Esophageal  cancer Neg Hx    Social History:  Social History   Substance and Sexual Activity  Alcohol Use No     Social History   Substance and Sexual Activity  Drug Use No    Social History   Socioeconomic History   Marital status: Single    Spouse name: Not on file   Number of children: Not on file   Years of education: Not on file   Highest education level: Not on file  Occupational History   Not on file  Tobacco Use   Smoking status: Former    Types: Cigarettes   Smokeless tobacco: Never  Vaping Use   Vaping status: Never Used  Substance and Sexual Activity   Alcohol use: No   Drug use: No   Sexual activity: Not on file  Other Topics Concern   Not on file  Social History Narrative   Not on file   Social Drivers of Health   Financial Resource Strain: Not on file  Food Insecurity: No Food Insecurity (03/27/2024)   Hunger Vital Sign    Worried About Running Out of Food in the Last Year: Never true    Ran Out of Food in the Last Year: Never true  Transportation Needs: No Transportation Needs (03/27/2024)   PRAPARE - Administrator, Civil Service (Medical): No    Lack of Transportation (Non-Medical): No  Physical Activity: Not on file  Stress: Not on file  Social Connections: Moderately  Integrated (03/27/2024)   Social Connection and Isolation Panel    Frequency of Communication with Friends and Family: Twice a week    Frequency of Social Gatherings with Friends and Family: Once a week    Attends Religious Services: 1 to 4 times per year    Active Member of [employer] or Organizations: No    Attends Engineer, structural: 1 to 4 times per year    Marital Status: Divorced   Past Medical History:  Past Medical History:  Diagnosis Date   Allergy    Arthritis    back   Prostate cancer (HCC) dx'd 2010   surg only    Past Surgical History:  Procedure Laterality Date   COLONOSCOPY     EYE SURGERY     PROSTATE SURGERY      Current Medications: Current  Facility-Administered Medications  Medication Dose Route Frequency Provider Last Rate Last Admin   acetaminophen  (TYLENOL ) tablet 650 mg  650 mg Oral Q6H PRN Coleman, Carolyn H, NP       alum & mag hydroxide-simeth (MAALOX/MYLANTA) 200-200-20 MG/5ML suspension 30 mL  30 mL Oral Q4H PRN Coleman, Carolyn H, NP       docusate sodium  (COLACE) capsule 100 mg  100 mg Oral Daily Kitrina Maurin, MD   100 mg at 05/08/24 9173   fluPHENAZine  (PROLIXIN ) tablet 10 mg  10 mg Oral BID Shrivastava, Aryendra, MD   10 mg at 05/08/24 2106   Or   fluPHENAZine  (PROLIXIN ) injection 5 mg  5 mg Intramuscular BID Shrivastava, Aryendra, MD       magnesium  hydroxide (MILK OF MAGNESIA) suspension 30 mL  30 mL Oral Daily PRN Mardy Elveria DEL, NP       OLANZapine  (ZYPREXA ) injection 5 mg  5 mg Intramuscular TID PRN Mardy Elveria DEL, NP   5 mg at 04/12/24 2230   OLANZapine  zydis (ZYPREXA ) disintegrating tablet 5 mg  5 mg Oral TID PRN Mardy Elveria DEL, NP       polyethylene glycol (MIRALAX  / GLYCOLAX ) packet 17 g  17 g Oral Daily PRN Donnelly Mellow, MD   17 g at 05/04/24 9077    Lab Results:  No results found for this or any previous visit (from the past 48 hours).   Blood Alcohol level:  Lab Results  Component Value Date   [facility] <15 03/26/2024    Metabolic Disorder Labs: Lab Results  Component Value Date   HGBA1C 5.6 04/02/2024   MPG 114 04/02/2024   No results found for: PROLACTIN Lab Results  Component Value Date   CHOL 146 04/02/2024   TRIG 37 04/02/2024   HDL 43 04/02/2024   CHOLHDL 3.4 04/02/2024   VLDL 7 04/02/2024   LDLCALC 96 04/02/2024    Physical Findings: AIMS:  , ,  ,  ,    CIWA:    COWS:      Psychiatric Specialty Exam:  Presentation  General Appearance:  Appropriate for Environment; Bizarre  Eye Contact: Fair  Speech: Clear and Coherent  Speech Volume: Normal    Mood and Affect  Mood: Anxious  Affect: Congruent   Thought Process  Thought  Processes: Disorganized  Descriptions of Associations:Intact  Orientation:Full (Time, Place and Person)  Thought Content: Paranoia at baseline Hallucinations: Denies  Ideas of Reference: Paranoia at baseline Suicidal Thoughts: Denies  Homicidal Thoughts: Denies   Sensorium  Memory: Immediate Fair; Recent Fair; Remote Fair  Judgment: Impaired  Insight: Shallow   Executive Functions  Concentration: Fair  Attention Span: Fair  Recall: [employer] of Knowledge: Fair  Language: Fair   Psychomotor Activity  Psychomotor Activity: No data recorded  Musculoskeletal: Strength & Muscle Tone: within normal limits Gait & Station: normal Assets  Assets: Manufacturing systems engineer; Resilience    Physical Exam: Physical Exam Vitals and nursing note reviewed.    ROS Blood pressure 125/82, pulse 70, temperature 99.1 F (37.3 C), resp. rate 14, height 6' (1.829 m), weight 100 kg, SpO2 99%. Body mass index is 29.91 kg/m.  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  Patient needs to be monitored closely for ongoing psychosis and paranoid delusions.  Given patient's confusion and chronic paranoia, no family support, APS report has been made and it got screened in for further evaluation by APS.   Differential diagnosis include: unspecified psychosis, delusion disorder, dementia, schizophrenia/schizoaffective disorder   Treatment Plan Summary: Requested the social work team to reach out to APS for safety concerns in the community as patient has chronic paranoia and confusion and is currently homeless patient remains confused and paranoid and spite of being compliant with medications.  Patient displays cognitive delay and deficits in executive  function like planning, organizing and processing information.  At this point patient lacks capacity to make medical decisions.  We recommend APS referral for possible legal guardianship as he has no family or legal next of kin to help with decision-making.   Safety and Monitoring:             -- InVoluntary admission to inpatient psychiatric unit for safety, stabilization and treatment             -- Daily contact with patient to assess and evaluate symptoms and progress in treatment             -- Patient's case to be discussed in multi-disciplinary team meeting             -- Observation Level: q15 minute checks             -- Vital signs:  q12 hours             -- Precautions: suicide, elopement, and assault   2. Psychiatric Diagnoses and Treatment:              Continue Prolixin  to 10 mg twice daily. Will discontinue Zyprexa .  -- The risks/benefits/side-effects/alternatives to this medication were discussed in detail with the patient and time was given for questions. The patient consents to medication trial.                -- Metabolic profile and EKG monitoring obtained while on an atypical antipsychotic (BMI: Lipid Panel: HbgA1c: QTc:)              -- Encouraged patient to participate in unit milieu and in scheduled group therapies                            3. Medical Issues Being Addressed:    No urgent medical needs noted  4. Discharge Planning:   -- Social work and case management to assist with discharge planning and identification of hospital follow-up needs prior to discharge  -- Estimated LOS: 3-4 days  Neythan Kozlov, MD  05/08/2024, 9:32 PM

## 2024-05-09 DIAGNOSIS — F22 Delusional disorders: Secondary | ICD-10-CM | POA: Diagnosis not present

## 2024-05-09 NOTE — BH IP Treatment Plan (Signed)
 Interdisciplinary Treatment and Diagnostic Plan Update  05/09/2024 Time of Session: 2:30PM Alazar Cherian MRN: [id number]  Principal Diagnosis: Delusional disorder [facility])  Secondary Diagnoses: Principal Problem:   Delusional disorder (HCC)   Current Medications:  Current Facility-Administered Medications  Medication Dose Route Frequency Provider Last Rate Last Admin   acetaminophen  (TYLENOL ) tablet 650 mg  650 mg Oral Q6H PRN Coleman, Carolyn H, NP       alum & mag hydroxide-simeth (MAALOX/MYLANTA) 200-200-20 MG/5ML suspension 30 mL  30 mL Oral Q4H PRN Coleman, Carolyn H, NP       docusate sodium  (COLACE) capsule 100 mg  100 mg Oral Daily Jadapalle, Sree, MD   100 mg at 05/09/24 1004   fluPHENAZine  (PROLIXIN ) tablet 10 mg  10 mg Oral BID Shrivastava, Aryendra, MD   10 mg at 05/09/24 1004   Or   fluPHENAZine  (PROLIXIN ) injection 5 mg  5 mg Intramuscular BID Shrivastava, Aryendra, MD       magnesium  hydroxide (MILK OF MAGNESIA) suspension 30 mL  30 mL Oral Daily PRN Coleman, Carolyn H, NP       OLANZapine  (ZYPREXA ) injection 5 mg  5 mg Intramuscular TID PRN Mardy Elveria DEL, NP   5 mg at 04/12/24 2230   OLANZapine  zydis (ZYPREXA ) disintegrating tablet 5 mg  5 mg Oral TID PRN Coleman, Carolyn H, NP       polyethylene glycol (MIRALAX  / GLYCOLAX ) packet 17 g  17 g Oral Daily PRN Jadapalle, Sree, MD   17 g at 05/04/24 9077   PTA Medications: Medications Prior to Admission  Medication Sig Dispense Refill Last Dose/Taking   docusate sodium  (COLACE) 100 MG capsule Take 100 mg by mouth daily.      omega-3 acid ethyl esters (LOVAZA) 1 g capsule Take 1 g by mouth daily.       Patient Stressors: Traumatic event    Patient Strengths: Communication skills   Treatment Modalities: Medication Management, Group therapy, Case management,  1 to 1 session with clinician, Psychoeducation, Recreational therapy.   Physician Treatment Plan for Primary Diagnosis: Delusional disorder [facility]) Long Term  Goal(s): Improvement in symptoms so as ready for discharge   Short Term Goals: Ability to identify changes in lifestyle to reduce recurrence of condition will improve Ability to verbalize feelings will improve Ability to disclose and discuss suicidal ideas Ability to demonstrate self-control will improve Ability to identify and develop effective coping behaviors will improve  Medication Management: Evaluate patient's response, side effects, and tolerance of medication regimen.  Therapeutic Interventions: 1 to 1 sessions, Unit Group sessions and Medication administration.  Evaluation of Outcomes: Progressing  Physician Treatment Plan for Secondary Diagnosis: Principal Problem:   Delusional disorder (HCC)  Long Term Goal(s): Improvement in symptoms so as ready for discharge   Short Term Goals: Ability to identify changes in lifestyle to reduce recurrence of condition will improve Ability to verbalize feelings will improve Ability to disclose and discuss suicidal ideas Ability to demonstrate self-control will improve Ability to identify and develop effective coping behaviors will improve     Medication Management: Evaluate patient's response, side effects, and tolerance of medication regimen.  Therapeutic Interventions: 1 to 1 sessions, Unit Group sessions and Medication administration.  Evaluation of Outcomes: Progressing   RN Treatment Plan for Primary Diagnosis: Delusional disorder (HCC) Long Term Goal(s): Knowledge of disease and therapeutic regimen to maintain health will improve  Short Term Goals: Ability to demonstrate self-control, Ability to participate in decision making will improve, Ability to verbalize feelings will  improve, Ability to disclose and discuss suicidal ideas, Ability to identify and develop effective coping behaviors will improve, and Compliance with prescribed medications will improve  Medication Management: RN will administer medications as ordered by  provider, will assess and evaluate patient's response and provide education to patient for prescribed medication. RN will report any adverse and/or side effects to prescribing provider.  Therapeutic Interventions: 1 on 1 counseling sessions, Psychoeducation, Medication administration, Evaluate responses to treatment, Monitor vital signs and CBGs as ordered, Perform/monitor CIWA, COWS, AIMS and Fall Risk screenings as ordered, Perform wound care treatments as ordered.  Evaluation of Outcomes: Progressing   LCSW Treatment Plan for Primary Diagnosis: Delusional disorder [name]) Long Term Goal(s): Safe transition to appropriate next level of care at discharge, Engage patient in therapeutic group addressing interpersonal concerns.  Short Term Goals: Engage patient in aftercare planning with referrals and resources, Increase social support, Increase ability to appropriately verbalize feelings, Increase emotional regulation, Facilitate acceptance of mental health diagnosis and concerns, and Increase skills for wellness and recovery  Therapeutic Interventions: Assess for all discharge needs, 1 to 1 time with Social worker, Explore available resources and support systems, Assess for adequacy in community support network, Educate family and significant other(s) on suicide prevention, Complete Psychosocial Assessment, Interpersonal group therapy.  Evaluation of Outcomes: Progressing   Progress in Treatment: Attending groups: Yes. Participating in groups: Yes. Taking medication as prescribed: Yes. Toleration medication: Yes. Family/Significant other contact made: Yes, individual(s) contacted:  SPE completed with the patient's brother.  Patient understands diagnosis: No. Discussing patient identified problems/goals with staff: Yes. Medical problems stabilized or resolved: Yes. Denies suicidal/homicidal ideation: Yes. Issues/concerns per patient self-inventory: No. Other: none  New Short Term/Long Term  Goal(s): elimination of symptoms of psychosis, medication management for mood stabilization; elimination of SI thoughts; development of comprehensive mental wellness plan. Update 04/02/24: No changes at this time. Update 04/07/24: No changes at this time. Update 04/12/24: No changes at this time Update 04/18/24: No changes at this time  Update 04/23/24: No changes at this time. Update 04/28/2024: No changes at this time.  Update 05/04/2024: No changes at this time.  Update 05/09/2024:  No changes at this time.    Patient Goals:  Get out of here, I want to get healed up so I can get out of here Update 04/02/24: No changes at this time. Update 04/07/24: No changes at this time .Update 04/12/24: No changes at this time Update 04/18/24: No changes at this time  Update 04/23/24: No changes at this time. Update 04/28/2024: No changes at this time. Update 05/04/2024: No changes at this time.  Update 05/09/2024:  No changes at this time.    Discharge Plan or Barriers: CSW will assist with appropriate discharge planning  Update 04/02/24: No changes at this time. Update 04/07/24: No changes at this time.Update 04/12/24: No changes at this time Update 04/18/24: CSW submitted report to APS. Care team looking at guardianship for pt at this time  Update 04/23/24: No changes at this time. Update 04/28/2024: No changes at this time. Update 05/04/2024: No changes at this time.  Update 05/09/2024:  [facility] APS continues to search for placement.    Reason for Continuation of Hospitalization: Delusions  Medication stabilization   Estimated Length of Stay: 1 to 7 days Update 04/02/24: TBD. Update 04/07/24: TBD Update 04/12/24:TBD. Update 04/28/24:TBD Update 05/04/2024: TBD Update 05/09/2024:  TBD      Last 3 [place] Suicide Severity Risk Score: Flowsheet Row Admission (Current) from 03/27/2024 in  [facility] from 03/26/2024 in [facility] at [facility] ED from 03/25/2024 in [facility]  C-SSRS RISK CATEGORY No Risk No Risk No Risk    Last [facility] 2/9 Scores:     No data to display          Scribe for Treatment Team: Sherryle JINNY Paola KEN 05/09/2024 4:52 PM

## 2024-05-09 NOTE — Progress Notes (Signed)
   05/09/24 1700  Psych Admission Type (Psych Patients Only)  Admission Status Involuntary  Psychosocial Assessment  Patient Complaints None  Eye Contact Fair  Facial Expression Worried  Affect Appropriate to circumstance  Speech Soft  Interaction Assertive  Motor Activity Slow  Appearance/Hygiene In scrubs  Behavior Characteristics Cooperative  Mood Pleasant  Thought Process  Coherency WDL  Content WDL  Delusions None reported or observed  Perception WDL  Hallucination None reported or observed  Judgment Impaired  Confusion Mild  Danger to Self  Current suicidal ideation? Denies  Agreement Not to Harm Self Yes  Description of Agreement verbal  Danger to Others  Danger to Others None reported or observed

## 2024-05-09 NOTE — Group Note (Signed)
 Date:  05/09/2024 Time:  11:08 AM  Group Topic/Focus:  Movement therapy    Participation Level:  Did Not Attend  Charlott CHRISTELLA Cassis 05/09/2024, 11:08 AM

## 2024-05-09 NOTE — Plan of Care (Signed)
  Problem: Education: Goal: Emotional status will improve Outcome: Progressing   Problem: Coping: Goal: Level of anxiety will decrease 05/09/2024 0220 by Garan Frappier, Shanda ORN, RN

## 2024-05-09 NOTE — Progress Notes (Signed)
   05/08/24 2200  Psych Admission Type (Psych Patients Only)  Admission Status Involuntary  Psychosocial Assessment  Patient Complaints None  Eye Contact Fair  Facial Expression Worried  Affect Appropriate to circumstance  Speech Soft  Interaction Assertive  Motor Activity Slow  Appearance/Hygiene In scrubs  Behavior Characteristics Cooperative  Mood Pleasant  Thought Process  Coherency WDL  Content WDL  Delusions None reported or observed  Perception WDL  Hallucination None reported or observed  Judgment Impaired  Confusion Mild  Danger to Self  Current suicidal ideation? Denies  Agreement Not to Harm Self Yes  Description of Agreement verbal  Danger to Others  Danger to Others None reported or observed

## 2024-05-09 NOTE — Progress Notes (Signed)
 [facility] MD Progress Note  05/09/2024 10:09 PM Benjamin [name]  MRN:  [id number] Benjamin [name] is a 78 year old male who presents to the inpatient geriatric psych unit after jumping from a two story building. Patient was originally seen at [facility] who referred him to the ED who admitted him here on 03/27/2024. Patient reports that people from the [place] gang were trying to kill him and the only escape was through the bedroom window on the second floor. He currently lives in the house with [name] and his family and has been living with them for 3-4 years without any problems. On 03/19/2024 he went to the doctor and when he got back home the family wouldn't let him leave. He states that after he realized the gang was going to kill him he barricaded the door with his bed and jumped out the window. When he landed he ran away until one of his neighbors found him and called 911.   Subjective: Chart is reviewed and discussed with the treatment team.  Patient is noted to be sitting in the ED and participating in groups.  He offers no complaints.  He denies SI/HI/plan and denies auditory/visual hallucinations.  He has fair appetite and sleep.  No behavioral problems   Able to understand medical problem Patient is unable to display any understanding of his medical problems or the current presentation leading up to admission Able to understand proposed treatment  Patient is unable to understand the proposed treatment with antipsychotics Patient remains paranoid and has cognitive impairment as he is unable to discuss his discharge planning.  Given his paranoia and delay in executive function patient is unable to make decisions regarding the treatment and disposition.    Sleep: Fair  Appetite:  Fair  Past Psychiatric History: see h&P  Family History:  Family History  Problem Relation Age of Onset   Colon cancer Neg Hx    Colon polyps Neg Hx    Stomach cancer Neg Hx    Esophageal  cancer Neg Hx    Social History:  Social History   Substance and Sexual Activity  Alcohol Use No     Social History   Substance and Sexual Activity  Drug Use No    Social History   Socioeconomic History   Marital status: Single    Spouse name: Not on file   Number of children: Not on file   Years of education: Not on file   Highest education level: Not on file  Occupational History   Not on file  Tobacco Use   Smoking status: Former    Types: Cigarettes   Smokeless tobacco: Never  Vaping Use   Vaping status: Never Used  Substance and Sexual Activity   Alcohol use: No   Drug use: No   Sexual activity: Not on file  Other Topics Concern   Not on file  Social History Narrative   Not on file   Social Drivers of Health   Financial Resource Strain: Not on file  Food Insecurity: No Food Insecurity (03/27/2024)   Hunger Vital Sign    Worried About Running Out of Food in the Last Year: Never true    Ran Out of Food in the Last Year: Never true  Transportation Needs: No Transportation Needs (03/27/2024)   PRAPARE - Administrator, Civil Service (Medical): No    Lack of Transportation (Non-Medical): No  Physical Activity: Not on file  Stress: Not on file  Social Connections: Moderately  Integrated (03/27/2024)   Social Connection and Isolation Panel    Frequency of Communication with Friends and Family: Twice a week    Frequency of Social Gatherings with Friends and Family: Once a week    Attends Religious Services: 1 to 4 times per year    Active Member of [employer] or Organizations: No    Attends Engineer, structural: 1 to 4 times per year    Marital Status: Divorced   Past Medical History:  Past Medical History:  Diagnosis Date   Allergy    Arthritis    back   Prostate cancer (HCC) dx'd 2010   surg only    Past Surgical History:  Procedure Laterality Date   COLONOSCOPY     EYE SURGERY     PROSTATE SURGERY      Current Medications: Current  Facility-Administered Medications  Medication Dose Route Frequency Provider Last Rate Last Admin   acetaminophen  (TYLENOL ) tablet 650 mg  650 mg Oral Q6H PRN Coleman, Carolyn H, NP       alum & mag hydroxide-simeth (MAALOX/MYLANTA) 200-200-20 MG/5ML suspension 30 mL  30 mL Oral Q4H PRN Coleman, Carolyn H, NP       docusate sodium  (COLACE) capsule 100 mg  100 mg Oral Daily Aramis Zobel, MD   100 mg at 05/09/24 1004   fluPHENAZine  (PROLIXIN ) tablet 10 mg  10 mg Oral BID Shrivastava, Aryendra, MD   10 mg at 05/09/24 2004   Or   fluPHENAZine  (PROLIXIN ) injection 5 mg  5 mg Intramuscular BID Shrivastava, Aryendra, MD       magnesium  hydroxide (MILK OF MAGNESIA) suspension 30 mL  30 mL Oral Daily PRN Mardy Elveria DEL, NP       OLANZapine  (ZYPREXA ) injection 5 mg  5 mg Intramuscular TID PRN Mardy Elveria DEL, NP   5 mg at 04/12/24 2230   OLANZapine  zydis (ZYPREXA ) disintegrating tablet 5 mg  5 mg Oral TID PRN Mardy Elveria DEL, NP       polyethylene glycol (MIRALAX  / GLYCOLAX ) packet 17 g  17 g Oral Daily PRN Donnelly Mellow, MD   17 g at 05/04/24 9077    Lab Results:  No results found for this or any previous visit (from the past 48 hours).   Blood Alcohol level:  Lab Results  Component Value Date   [facility] <15 03/26/2024    Metabolic Disorder Labs: Lab Results  Component Value Date   HGBA1C 5.6 04/02/2024   MPG 114 04/02/2024   No results found for: PROLACTIN Lab Results  Component Value Date   CHOL 146 04/02/2024   TRIG 37 04/02/2024   HDL 43 04/02/2024   CHOLHDL 3.4 04/02/2024   VLDL 7 04/02/2024   LDLCALC 96 04/02/2024    Physical Findings: AIMS:  , ,  ,  ,    CIWA:    COWS:      Psychiatric Specialty Exam:  Presentation  General Appearance:  Appropriate for Environment; Bizarre  Eye Contact: Fair  Speech: Clear and Coherent  Speech Volume: Normal    Mood and Affect  Mood: Anxious  Affect: Congruent   Thought Process  Thought  Processes: Disorganized  Descriptions of Associations:Intact  Orientation:Full (Time, Place and Person)  Thought Content: Paranoia at baseline Hallucinations: Denies  Ideas of Reference: Paranoia at baseline Suicidal Thoughts: Denies  Homicidal Thoughts: Denies   Sensorium  Memory: Immediate Fair; Recent Fair; Remote Fair  Judgment: Impaired  Insight: Shallow   Executive Functions  Concentration: Fair  Attention Span: Fair  Recall: [employer] of Knowledge: Fair  Language: Fair   Psychomotor Activity  Psychomotor Activity: No data recorded  Musculoskeletal: Strength & Muscle Tone: within normal limits Gait & Station: normal Assets  Assets: Manufacturing systems engineer; Resilience    Physical Exam: Physical Exam Vitals and nursing note reviewed.    ROS Blood pressure 116/78, pulse 75, temperature 98.2 F (36.8 C), resp. rate 14, height 6' (1.829 m), weight 100 kg, SpO2 95%. Body mass index is 29.91 kg/m.  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  Patient needs to be monitored closely for ongoing psychosis and paranoid delusions.  Given patient's confusion and chronic paranoia, no family support, APS report has been made and it got screened in for further evaluation by APS.   Differential diagnosis include: unspecified psychosis, delusion disorder, dementia, schizophrenia/schizoaffective disorder   Treatment Plan Summary: Requested the social work team to reach out to APS for safety concerns in the community as patient has chronic paranoia and confusion and is currently homeless patient remains confused and paranoid and spite of being compliant with medications.  Patient displays cognitive delay and deficits in executive  function like planning, organizing and processing information.  At this point patient lacks capacity to make medical decisions.  We recommend APS referral for possible legal guardianship as he has no family or legal next of kin to help with decision-making.   Safety and Monitoring:             -- InVoluntary admission to inpatient psychiatric unit for safety, stabilization and treatment             -- Daily contact with patient to assess and evaluate symptoms and progress in treatment             -- Patient's case to be discussed in multi-disciplinary team meeting             -- Observation Level: q15 minute checks             -- Vital signs:  q12 hours             -- Precautions: suicide, elopement, and assault   2. Psychiatric Diagnoses and Treatment:              Continue Prolixin  to 10 mg twice daily. Will discontinue Zyprexa .  -- The risks/benefits/side-effects/alternatives to this medication were discussed in detail with the patient and time was given for questions. The patient consents to medication trial.                -- Metabolic profile and EKG monitoring obtained while on an atypical antipsychotic (BMI: Lipid Panel: HbgA1c: QTc:)              -- Encouraged patient to participate in unit milieu and in scheduled group therapies                            3. Medical Issues Being Addressed:    No urgent medical needs noted  4. Discharge Planning:   -- Social work and case management to assist with discharge planning and identification of hospital follow-up needs prior to discharge  -- Estimated LOS: 3-4 days  Wesam Gearhart, MD  05/09/2024, 10:09 PM

## 2024-05-09 NOTE — Group Note (Signed)
 LCSW Group Therapy Note  Group Date: 05/09/2024 Start Time: 1305 End Time: 1345   Type of Therapy and Topic:  Group Therapy: Anger Cues and Responses  Participation Level:  None   Description of Group:   In this group, patients learned how to recognize the physical, cognitive, emotional, and behavioral responses they have to anger-provoking situations.  They identified a recent time they became angry and how they reacted.  They analyzed how their reaction was possibly beneficial and how it was possibly unhelpful.  The group discussed a variety of healthier coping skills that could help with such a situation in the future.  Focus was placed on how helpful it is to recognize the underlying emotions to our anger, because working on those can lead to a more permanent solution as well as our ability to focus on the important rather than the urgent.  Therapeutic Goals: Patients will remember their last incident of anger and how they felt emotionally and physically, what their thoughts were at the time, and how they behaved. Patients will identify how their behavior at that time worked for them, as well as how it worked against them. Patients will explore possible new behaviors to use in future anger situations. Patients will learn that anger itself is normal and cannot be eliminated, and that healthier reactions can assist with resolving conflict rather than worsening situations.  Summary of Patient Progress:   Patient was present in group, however, did not participate in group discussion.    Therapeutic Modalities:   Cognitive Behavioral Therapy    Sherryle JINNY Margo, LCSW 05/09/2024  1:45 PM

## 2024-05-09 NOTE — Plan of Care (Signed)

## 2024-05-10 DIAGNOSIS — F22 Delusional disorders: Secondary | ICD-10-CM | POA: Diagnosis not present

## 2024-05-10 NOTE — Group Note (Signed)
 Date:  05/10/2024 Time:  3:41 AM  Group Topic/Focus:  Making Healthy Choices:   The focus of this group is to help patients identify negative/unhealthy choices they were using prior to admission and identify positive/healthier coping strategies to replace them upon discharge.    Participation Level:  Active  Participation Quality:  Appropriate  Affect:  Appropriate  Cognitive:  Alert  Insight: Appropriate  Engagement in Group:  Engaged  Modes of Intervention:  Activity  Additional Comments:    Benjamin [name] 05/10/2024, 3:41 AM

## 2024-05-10 NOTE — Group Note (Signed)

## 2024-05-10 NOTE — Progress Notes (Signed)
 Mood:  Pleasant and cooperative.    Psych assessment:  Denies SI/HI and AVH.   Interaction / Group attendance:  Isolated to room this morning.  Attended afternoon RT group this afternoon.  Engaged with peers and playing cards this afternoon.   Medication/ PRNs:  Compliant with scheduled medications.  PRN given for constipation.  Pain: Deneis  15 min checks in place for safety.

## 2024-05-10 NOTE — Plan of Care (Signed)

## 2024-05-10 NOTE — Progress Notes (Signed)
   05/10/24 2100  Psych Admission Type (Psych Patients Only)  Admission Status Involuntary  Psychosocial Assessment  Patient Complaints None  Eye Contact Fair  Facial Expression Fixed smile  Affect Appropriate to circumstance  Speech Soft  Interaction Minimal  Motor Activity Slow  Appearance/Hygiene In scrubs  Behavior Characteristics Cooperative  Mood Pleasant  Thought Process  Coherency WDL  Content WDL  Delusions None reported or observed  Perception WDL  Hallucination None reported or observed  Judgment Impaired  Confusion Mild  Danger to Self  Current suicidal ideation? Denies

## 2024-05-10 NOTE — Progress Notes (Signed)
 [name] MD Progress Note  05/10/2024 12:34 PM Benjamin [name]  MRN:  [id number] Benjamin [name] is a 78 year old male who presents to the inpatient geriatric psych unit after jumping from a two story building. Patient was originally seen at [facility] who referred him to the ED who admitted him here on 03/27/2024. Patient reports that people from the [place] gang were trying to kill him and the only escape was through the bedroom window on the second floor. He currently lives in the house with [name] and his family and has been living with them for 3-4 years without any problems. On 03/19/2024 he went to the doctor and when he got back home the family wouldn't let him leave. He states that after he realized the gang was going to kill him he barricaded the door with his bed and jumped out the window. When he landed he ran away until one of his neighbors found him and called 911.   Subjective: Chart is reviewed and discussed with the treatment team.  Patient is noted to be resting in bed.  He denies any SI/HI/plan.  He denies auditory/visual hallucinations he denies having any confusion.  He has fair appetite and sleep.   Able to understand medical problem Patient is unable to display any understanding of his medical problems or the current presentation leading up to admission Able to understand proposed treatment  Patient is unable to understand the proposed treatment with antipsychotics Patient remains paranoid and has cognitive impairment as he is unable to discuss his discharge planning.  Given his paranoia and delay in executive function patient is unable to make decisions regarding the treatment and disposition.    Sleep: Fair  Appetite:  Fair  Past Psychiatric History: see h&P  Family History:  Family History  Problem Relation Age of Onset   Colon cancer Neg Hx    Colon polyps Neg Hx    Stomach cancer Neg Hx    Esophageal cancer Neg Hx    Social History:  Social  History   Substance and Sexual Activity  Alcohol Use No     Social History   Substance and Sexual Activity  Drug Use No    Social History   Socioeconomic History   Marital status: Single    Spouse name: Not on file   Number of children: Not on file   Years of education: Not on file   Highest education level: Not on file  Occupational History   Not on file  Tobacco Use   Smoking status: Former    Types: Cigarettes   Smokeless tobacco: Never  Vaping Use   Vaping status: Never Used  Substance and Sexual Activity   Alcohol use: No   Drug use: No   Sexual activity: Not on file  Other Topics Concern   Not on file  Social History Narrative   Not on file   Social Drivers of Health   Financial Resource Strain: Not on file  Food Insecurity: No Food Insecurity (03/27/2024)   Hunger Vital Sign    Worried About Running Out of Food in the Last Year: Never true    Ran Out of Food in the Last Year: Never true  Transportation Needs: No Transportation Needs (03/27/2024)   PRAPARE - Administrator, Civil Service (Medical): No    Lack of Transportation (Non-Medical): No  Physical Activity: Not on file  Stress: Not on file  Social Connections: Moderately Integrated (03/27/2024)   Social Connection and  Isolation Panel    Frequency of Communication with Friends and Family: Twice a week    Frequency of Social Gatherings with Friends and Family: Once a week    Attends Religious Services: 1 to 4 times per year    Active Member of [employer] or Organizations: No    Attends Engineer, structural: 1 to 4 times per year    Marital Status: Divorced   Past Medical History:  Past Medical History:  Diagnosis Date   Allergy    Arthritis    back   Prostate cancer (HCC) dx'd 2010   surg only    Past Surgical History:  Procedure Laterality Date   COLONOSCOPY     EYE SURGERY     PROSTATE SURGERY      Current Medications: Current Facility-Administered Medications   Medication Dose Route Frequency Provider Last Rate Last Admin   acetaminophen  (TYLENOL ) tablet 650 mg  650 mg Oral Q6H PRN Coleman, Carolyn H, NP       alum & mag hydroxide-simeth (MAALOX/MYLANTA) 200-200-20 MG/5ML suspension 30 mL  30 mL Oral Q4H PRN Coleman, Carolyn H, NP       docusate sodium  (COLACE) capsule 100 mg  100 mg Oral Daily Pranav Lince, MD   100 mg at 05/10/24 9195   fluPHENAZine  (PROLIXIN ) tablet 10 mg  10 mg Oral BID Shrivastava, Aryendra, MD   10 mg at 05/10/24 9195   Or   fluPHENAZine  (PROLIXIN ) injection 5 mg  5 mg Intramuscular BID Shrivastava, Aryendra, MD       magnesium  hydroxide (MILK OF MAGNESIA) suspension 30 mL  30 mL Oral Daily PRN Mardy Elveria DEL, NP       OLANZapine  (ZYPREXA ) injection 5 mg  5 mg Intramuscular TID PRN Mardy Elveria DEL, NP   5 mg at 04/12/24 2230   OLANZapine  zydis (ZYPREXA ) disintegrating tablet 5 mg  5 mg Oral TID PRN Mardy Elveria DEL, NP       polyethylene glycol (MIRALAX  / GLYCOLAX ) packet 17 g  17 g Oral Daily PRN Donnelly Mellow, MD   17 g at 05/10/24 0830    Lab Results:  No results found for this or any previous visit (from the past 48 hours).   Blood Alcohol level:  Lab Results  Component Value Date   [facility] <15 03/26/2024    Metabolic Disorder Labs: Lab Results  Component Value Date   HGBA1C 5.6 04/02/2024   MPG 114 04/02/2024   No results found for: PROLACTIN Lab Results  Component Value Date   CHOL 146 04/02/2024   TRIG 37 04/02/2024   HDL 43 04/02/2024   CHOLHDL 3.4 04/02/2024   VLDL 7 04/02/2024   LDLCALC 96 04/02/2024    Physical Findings: AIMS:  , ,  ,  ,    CIWA:    COWS:      Psychiatric Specialty Exam:  Presentation  General Appearance:  Appropriate for Environment; Bizarre  Eye Contact: Fair  Speech: Clear and Coherent  Speech Volume: Normal    Mood and Affect  Mood: Anxious  Affect: Congruent   Thought Process  Thought Processes: Disorganized  Descriptions of  Associations:Intact  Orientation:Full (Time, Place and Person)  Thought Content: Paranoia at baseline Hallucinations: Denies  Ideas of Reference: Paranoia at baseline Suicidal Thoughts: Denies  Homicidal Thoughts: Denies   Sensorium  Memory: Immediate Fair; Recent Fair; Remote Fair  Judgment: Impaired  Insight: Shallow   Executive Functions  Concentration: Fair  Attention Span: Fair  Recall: Fair  Fund of Knowledge: Fair  Language: Fair   Psychomotor Activity  Psychomotor Activity: No data recorded  Musculoskeletal: Strength & Muscle Tone: within normal limits Gait & Station: normal Assets  Assets: Manufacturing systems engineer; Resilience    Physical Exam: Physical Exam Vitals and nursing note reviewed.    ROS Blood pressure 129/73, pulse 65, temperature 97.8 F (36.6 C), resp. rate 18, height 6' (1.829 m), weight 100 kg, SpO2 98%. Body mass index is 29.91 kg/m.  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  Patient needs to be monitored closely for ongoing psychosis and paranoid delusions.  Given patient's confusion and chronic paranoia, no family support, APS report has been made and it got screened in for further evaluation by APS.   Differential diagnosis include: unspecified psychosis, delusion disorder, dementia, schizophrenia/schizoaffective disorder   Treatment Plan Summary: Requested the social work team to reach out to APS for safety concerns in the community as patient has chronic paranoia and confusion and is currently homeless patient remains confused and paranoid and spite of being compliant with medications.  Patient displays cognitive delay and deficits in executive function like planning, organizing and processing  information.  At this point patient lacks capacity to make medical decisions.  We recommend APS referral for possible legal guardianship as he has no family or legal next of kin to help with decision-making.   Safety and Monitoring:             -- InVoluntary admission to inpatient psychiatric unit for safety, stabilization and treatment             -- Daily contact with patient to assess and evaluate symptoms and progress in treatment             -- Patient's case to be discussed in multi-disciplinary team meeting             -- Observation Level: q15 minute checks             -- Vital signs:  q12 hours             -- Precautions: suicide, elopement, and assault   2. Psychiatric Diagnoses and Treatment:              Continue Prolixin  to 10 mg twice daily. Will discontinue Zyprexa .  -- The risks/benefits/side-effects/alternatives to this medication were discussed in detail with the patient and time was given for questions. The patient consents to medication trial.                -- Metabolic profile and EKG monitoring obtained while on an atypical antipsychotic (BMI: Lipid Panel: HbgA1c: QTc:)              -- Encouraged patient to participate in unit milieu and in scheduled group therapies                            3. Medical Issues Being Addressed:    No urgent medical needs noted  4. Discharge Planning:   -- Social work and case management to assist with discharge planning and identification of hospital follow-up needs prior to discharge  -- Estimated LOS: 3-4 days  Arnav Cregg, MD 05/10/2024, 12:34 PM

## 2024-05-10 NOTE — Plan of Care (Signed)
   Problem: Activity: Goal: Risk for activity intolerance will decrease Outcome: Progressing   Problem: Nutrition: Goal: Adequate nutrition will be maintained Outcome: Progressing   Problem: Coping: Goal: Level of anxiety will decrease Outcome: Progressing

## 2024-05-10 NOTE — Group Note (Signed)
 Recreation Therapy Group Note   Group Topic:Leisure Education  Group Date: 05/10/2024 Start Time: 1500 End Time: 1600 Facilitators: Celestia Jeoffrey BRAVO, LRT, CTRS Location: Courtyard  Group Description: Outdoor Recreation. Patients had the option to play corn hole, ring toss, UNO, or listening to music while outside in the courtyard getting fresh air and sunlight. Patients helped water and prune the raised garden beds. LRT and patients discussed things that they enjoy doing in their free time outside of the hospital. LRT encouraged patients to drink water after being active and getting their heart rate up.   Goal Area(s) Addressed: Patient will identify leisure interests.  Patient will practice healthy decision making. Patient will engage in recreation activity.   Affect/Mood: Appropriate   Participation Level: Active and Engaged   Participation Quality: Independent   Behavior: Appropriate, Calm, and Cooperative   Speech/Thought Process: Coherent   Insight: Good   Judgement: Good   Modes of Intervention: Education, Exploration, and Music   Patient Response to Interventions:  Attentive, Engaged, Interested , and Receptive   Education Outcome:  Acknowledges education   Clinical Observations/Individualized Feedback: Benjamin [name] was active in their participation of session activities and group discussion. Pt chose to play corn hole while in group.    Plan: Continue to engage patient in RT group sessions 2-3x/week.   Jeoffrey BRAVO Celestia, LRT, CTRS 05/10/2024 4:58 PM

## 2024-05-10 NOTE — Group Note (Signed)
 Recreation Therapy Group Note   Group Topic:Animal Assisted Therapy   Group Date: 05/10/2024 Start Time: 1035 End Time: 1100 Facilitators: Celestia Jeoffrey BRAVO, LRT, CTRS Location: Courtyard  Group Description: AAA. Animal-Assisted Activity provides opportunities for motivational, educational, therapeutic and/or recreational benefits to enhance quality of life. Selinda and Rollo visited the unit to interact with patients.   Goal Areas Addressed:  Reduced anxiety and stress Improved mood Increased social interaction Enhanced communication skills Reduced loneliness and isolation Improved emotional regulation  Affect/Mood: N/A   Participation Level: Did not attend    Clinical Observations/Individualized Feedback: Patient did not attend group.   Plan: Continue to engage patient in RT group sessions 2-3x/week.   Jeoffrey BRAVO Celestia, LRT, CTRS 05/10/2024 1:34 PM

## 2024-05-10 NOTE — Progress Notes (Signed)
   05/09/24 2100  Psych Admission Type (Psych Patients Only)  Admission Status Involuntary  Psychosocial Assessment  Patient Complaints None  Eye Contact Fair  Facial Expression Worried  Affect Appropriate to circumstance  Speech Soft  Interaction Assertive  Motor Activity Slow  Appearance/Hygiene In scrubs  Behavior Characteristics Cooperative  Mood Pleasant  Thought Process  Coherency WDL  Content WDL  Delusions None reported or observed  Perception WDL  Hallucination None reported or observed  Judgment Impaired  Confusion Mild  Danger to Self  Current suicidal ideation? Denies

## 2024-05-10 NOTE — Group Note (Signed)
 Date:  05/10/2024 Time:  1:10 PM  Group Topic/Focus:  Wellness Toolbox:   The focus of this group is to discuss various aspects of wellness, balancing those aspects and exploring ways to increase the ability to experience wellness.  Patients will create a wellness toolbox for use upon discharge.    Participation Level:  Did Not Attend   Deitra Cl[facility] 05/10/2024, 1:10 PM

## 2024-05-11 DIAGNOSIS — F22 Delusional disorders: Secondary | ICD-10-CM | POA: Diagnosis not present

## 2024-05-11 NOTE — Group Note (Signed)
 Physical/Occupational Therapy Group Note  Group Topic: UE Therex   Group Date: 05/11/2024 Start Time: 1315 End Time: 1355 Facilitators: Clive Warren CROME, OT   Group Description: Group instructed in series of upper extremities exercises, aimed to promote strength, flexibility, range of motion and functional endurance.  Patients provided cuing for proper mechanics and proper pace of exercise; exercises adjusted as necessary for individualized patient needs.  Patient also engaged in cognitive components throughout session, working to integrate attention to task, command following, turn-taking and appropriate social interaction throughout session.  Allowed to ask questions as appropriate, and encouraged to identify specific exercises that they could complete independently outside of group sessions.   Therapeutic Goal(s): Demonstrate appropriate performance of upper extremity exercises to promote strength, flexibility, range of motion and functional endurance Identify 2-3 specific upper extremity exercises to complete as home exercise program outside of group session   Individual Participation: Pt engaged throughout. Required MOD-MAX multimodal cues for each exercise as he demonstrated difficulty with sequencing. Pt participated in group discussion with prompting.   Participation Level: Moderate   Participation Quality: Moderate Cues and Maximum Cues   Behavior: Calm, Reserved, and Sedated   Speech/Thought Process: Relevant   Affect/Mood: Flat   Insight: Poor   Judgement: Poor   Modes of Intervention: Activity, Clarification, Discussion, Education, Exploration, Problem-solving, Rapport Building, Socialization, and Support  Patient Response to Interventions:  Receptive   Plan: Continue to engage patient in PT/OT groups 1 - 2x/week.  Tiamarie Furnari R., MPH, MS, OTR/L ascom [phone] 05/11/24, 4:34 PM

## 2024-05-11 NOTE — Group Note (Signed)
 Recreation Therapy Group Note   Group Topic:Emotion Expression  Group Date: 05/11/2024 Start Time: 1500 End Time: 1600 Facilitators: Celestia Jeoffrey BRAVO, LRT, CTRS Location: Dayroom  Group Description: Painting a Diplomatic Services operational officer. Patients and LRT discuss what it means to be "at peace", what it feels like physically and mentally. Pts are given a canvas and watercolor paint to use and encouraged to draw their idea of a peaceful place. Pts and LRT discuss how they use this in their daily life post discharge. Pts are encouraged to take their canvas home with them as a reminder to find their peaceful place whenever they are feeling depressed, anxious, etc.    Goal Area(s) Addressed:  Patient will identify what it means to experience a "peaceful" emotion. Patient will identify a new coping skill.  Patient will express their emotions through art. Patients will increase communication by talking with LRT and peers while in group.   Affect/Mood: N/A   Participation Level: Did not attend    Clinical Observations/Individualized Feedback: Patient did not attend group.   Plan: Continue to engage patient in RT group sessions 2-3x/week.   Jeoffrey BRAVO Celestia, LRT, CTRS 05/11/2024 5:09 PM

## 2024-05-11 NOTE — Progress Notes (Signed)
 [facility] MD Progress Note  05/11/2024 10:46 PM Benjamin [name]  MRN:  [id number] Benjamin [name] is a 78 year old male who presents to the inpatient geriatric psych unit after jumping from a two story building. Patient was originally seen at [facility] who referred him to the ED who admitted him here on 03/27/2024. Patient reports that people from the [place] gang were trying to kill him and the only escape was through the bedroom window on the second floor. He currently lives in the house with [name] and his family and has been living with them for 3-4 years without any problems. On 03/19/2024 he went to the doctor and when he got back home the family wouldn't let him leave. He states that after he realized the gang was going to kill him he barricaded the door with his bed and jumped out the window. When he landed he ran away until one of his neighbors found him and called 911.   Subjective: Chart is reviewed and discussed with the treatment team.  Patient is noted to be resting in bed.  Patient continues to show cognitive decline.  He is not endorsing any SI/HI/plan and denies hallucinations.  Per nursing report patient remains confused but no behavioral problems on the unit.  Able to understand medical problem Patient is unable to display any understanding of his medical problems or the current presentation leading up to admission Able to understand proposed treatment  Patient is unable to understand the proposed treatment with antipsychotics Patient remains paranoid and has cognitive impairment as he is unable to discuss his discharge planning.  Given his paranoia and delay in executive function patient is unable to make decisions regarding the treatment and disposition.    Sleep: Fair  Appetite:  Fair  Past Psychiatric History: see h&P  Family History:  Family History  Problem Relation Age of Onset   Colon cancer Neg Hx    Colon polyps Neg Hx    Stomach cancer Neg Hx     Esophageal cancer Neg Hx    Social History:  Social History   Substance and Sexual Activity  Alcohol Use No     Social History   Substance and Sexual Activity  Drug Use No    Social History   Socioeconomic History   Marital status: Single    Spouse name: Not on file   Number of children: Not on file   Years of education: Not on file   Highest education level: Not on file  Occupational History   Not on file  Tobacco Use   Smoking status: Former    Types: Cigarettes   Smokeless tobacco: Never  Vaping Use   Vaping status: Never Used  Substance and Sexual Activity   Alcohol use: No   Drug use: No   Sexual activity: Not on file  Other Topics Concern   Not on file  Social History Narrative   Not on file   Social Drivers of Health   Financial Resource Strain: Not on file  Food Insecurity: No Food Insecurity (03/27/2024)   Hunger Vital Sign    Worried About Running Out of Food in the Last Year: Never true    Ran Out of Food in the Last Year: Never true  Transportation Needs: No Transportation Needs (03/27/2024)   PRAPARE - Administrator, Civil Service (Medical): No    Lack of Transportation (Non-Medical): No  Physical Activity: Not on file  Stress: Not on file  Social Connections:  Moderately Integrated (03/27/2024)   Social Connection and Isolation Panel    Frequency of Communication with Friends and Family: Twice a week    Frequency of Social Gatherings with Friends and Family: Once a week    Attends Religious Services: 1 to 4 times per year    Active Member of [employer] or Organizations: No    Attends Engineer, structural: 1 to 4 times per year    Marital Status: Divorced   Past Medical History:  Past Medical History:  Diagnosis Date   Allergy    Arthritis    back   Prostate cancer (HCC) dx'd 2010   surg only    Past Surgical History:  Procedure Laterality Date   COLONOSCOPY     EYE SURGERY     PROSTATE SURGERY      Current  Medications: Current Facility-Administered Medications  Medication Dose Route Frequency Provider Last Rate Last Admin   acetaminophen  (TYLENOL ) tablet 650 mg  650 mg Oral Q6H PRN Coleman, Carolyn H, NP       alum & mag hydroxide-simeth (MAALOX/MYLANTA) 200-200-20 MG/5ML suspension 30 mL  30 mL Oral Q4H PRN Coleman, Carolyn H, NP       docusate sodium  (COLACE) capsule 100 mg  100 mg Oral Daily Vihan Santagata, MD   100 mg at 05/11/24 9160   fluPHENAZine  (PROLIXIN ) tablet 10 mg  10 mg Oral BID Shrivastava, Aryendra, MD   10 mg at 05/11/24 2113   Or   fluPHENAZine  (PROLIXIN ) injection 5 mg  5 mg Intramuscular BID Shrivastava, Aryendra, MD       magnesium  hydroxide (MILK OF MAGNESIA) suspension 30 mL  30 mL Oral Daily PRN Mardy Elveria DEL, NP       OLANZapine  (ZYPREXA ) injection 5 mg  5 mg Intramuscular TID PRN Mardy Elveria DEL, NP   5 mg at 04/12/24 2230   OLANZapine  zydis (ZYPREXA ) disintegrating tablet 5 mg  5 mg Oral TID PRN Mardy Elveria DEL, NP       polyethylene glycol (MIRALAX  / GLYCOLAX ) packet 17 g  17 g Oral Daily PRN Donnelly Mellow, MD   17 g at 05/10/24 0830    Lab Results:  No results found for this or any previous visit (from the past 48 hours).   Blood Alcohol level:  Lab Results  Component Value Date   [facility] <15 03/26/2024    Metabolic Disorder Labs: Lab Results  Component Value Date   HGBA1C 5.6 04/02/2024   MPG 114 04/02/2024   No results found for: PROLACTIN Lab Results  Component Value Date   CHOL 146 04/02/2024   TRIG 37 04/02/2024   HDL 43 04/02/2024   CHOLHDL 3.4 04/02/2024   VLDL 7 04/02/2024   LDLCALC 96 04/02/2024    Physical Findings: AIMS:  , ,  ,  ,    CIWA:    COWS:      Psychiatric Specialty Exam:  Presentation  General Appearance:  Appropriate for Environment; Bizarre  Eye Contact: Fair  Speech: Clear and Coherent  Speech Volume: Normal    Mood and Affect  Mood: Anxious  Affect: Congruent   Thought Process   Thought Processes: Disorganized  Descriptions of Associations:Intact  Orientation:Full (Time, Place and Person)  Thought Content: Paranoia at baseline Hallucinations: Denies  Ideas of Reference: Paranoia at baseline Suicidal Thoughts: Denies  Homicidal Thoughts: Denies   Sensorium  Memory: Immediate Fair; Recent Fair; Remote Fair  Judgment: Impaired  Insight: Shallow   Executive Functions  Concentration:  Fair  Attention Span: Fair  Recall: [employer] of Knowledge: Fair  Language: Fair   Psychomotor Activity  Psychomotor Activity: No data recorded  Musculoskeletal: Strength & Muscle Tone: within normal limits Gait & Station: normal Assets  Assets: Manufacturing systems engineer; Resilience    Physical Exam: Physical Exam Vitals and nursing note reviewed.    ROS Blood pressure 109/70, pulse 68, temperature 97.7 F (36.5 C), resp. rate 16, height 6' (1.829 m), weight 100 kg, SpO2 100%. Body mass index is 29.91 kg/m.  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  Patient needs to be monitored closely for ongoing psychosis and paranoid delusions.  Given patient's confusion and chronic paranoia, no family support, APS report has been made and it got screened in for further evaluation by APS.   Differential diagnosis include: unspecified psychosis, delusion disorder, dementia, schizophrenia/schizoaffective disorder   Treatment Plan Summary: Requested the social work team to reach out to APS for safety concerns in the community as patient has chronic paranoia and confusion and is currently homeless patient remains confused and paranoid and spite of being compliant with medications.  Patient displays cognitive delay and deficits in  executive function like planning, organizing and processing information.  At this point patient lacks capacity to make medical decisions.  We recommend APS referral for possible legal guardianship as he has no family or legal next of kin to help with decision-making.   Safety and Monitoring:             -- InVoluntary admission to inpatient psychiatric unit for safety, stabilization and treatment             -- Daily contact with patient to assess and evaluate symptoms and progress in treatment             -- Patient's case to be discussed in multi-disciplinary team meeting             -- Observation Level: q15 minute checks             -- Vital signs:  q12 hours             -- Precautions: suicide, elopement, and assault   2. Psychiatric Diagnoses and Treatment:              Continue Prolixin  to 10 mg twice daily. discontinued Zyprexa .  -- The risks/benefits/side-effects/alternatives to this medication were discussed in detail with the patient and time was given for questions. The patient consents to medication trial.                -- Metabolic profile and EKG monitoring obtained while on an atypical antipsychotic (BMI: Lipid Panel: HbgA1c: QTc:)              -- Encouraged patient to participate in unit milieu and in scheduled group therapies                            3. Medical Issues Being Addressed:    No urgent medical needs noted  4. Discharge Planning:   -- Social work and case management to assist with discharge planning and identification of hospital follow-up needs prior to discharge  -- Estimated LOS: 3-4 days  Cloee Dunwoody,  MD 05/11/2024, 10:46 PM

## 2024-05-11 NOTE — Group Note (Signed)
 Date:  05/11/2024 Time:  8:30 PM  Group Topic/Focus:  Overcoming Stress:   The focus of this group is to define stress and help patients assess their triggers.    Participation Level:  Active  Participation Quality:  Appropriate  Affect:  Appropriate  Cognitive:  Appropriate  Insight: Good  Engagement in Group:  Engaged  Modes of Intervention:  Discussion  Additional Comments:    Romero Earnie Hope 05/11/2024, 8:30 PM

## 2024-05-11 NOTE — Progress Notes (Signed)
 Mood:  Pleasant and cooperative. Mildly confused.    Psych assessment: Denies SI/HI and AVH.   Interaction / Group attendance:  Only present in the milieu for meals.  Appropriate interaction with peers while in milieu.   Medication/ PRNs: Compliant with scheduled medications. No PRNs given.  Pain: Denies  15 min checks in place for safety.

## 2024-05-12 DIAGNOSIS — F22 Delusional disorders: Secondary | ICD-10-CM | POA: Diagnosis not present

## 2024-05-12 NOTE — Group Note (Signed)
 Date:  05/12/2024 Time:  10:23 AM  Group Topic/Focus:  Movement Therapy    Participation Level:  Did Not Attend    Benjamin [name] Bias 05/12/2024, 10:23 AM

## 2024-05-12 NOTE — Group Note (Signed)
 Date:  05/12/2024 Time:  8:41 PM  Group Topic/Focus:  Goals Group:   The focus of this group is to help patients establish daily goals to achieve during treatment and discuss how the patient can incorporate goal setting into their daily lives to aide in recovery.    Participation Level:  Active  Participation Quality:  Appropriate  Affect:  Appropriate  Cognitive:  Appropriate  Insight: Appropriate and Good  Engagement in Group:  Engaged  Modes of Intervention:  Discussion  Additional Comments:    Benjamin [name] 05/12/2024, 8:41 PM

## 2024-05-12 NOTE — Group Note (Signed)
 Date:  05/12/2024 Time:  5:46 PM  Group Topic/Focus:  Goals Group:   The focus of this group is to help patients establish daily goals to achieve during treatment and discuss how the patient can incorporate goal setting into their daily lives to aide in recovery. Self Care:   The focus of this group is to help patients understand the importance of self-care in order to improve or restore emotional, physical, spiritual, interpersonal, and financial health. Healthy Eating habits!     Participation Level:  Did Not Attend  Participation Quality:    Affect:    Cognitive:  Insight:   Engagement in Group:    Modes of Intervention:    Additional Comments:    Dajai Wahlert L Ketsia Linebaugh 05/12/2024, 5:46 PM

## 2024-05-12 NOTE — Progress Notes (Signed)
   05/12/24 1000  Psych Admission Type (Psych Patients Only)  Admission Status Involuntary  Psychosocial Assessment  Patient Complaints None  Eye Contact Brief  Facial Expression Blank  Affect Appropriate to circumstance  Speech Soft  Interaction Isolative  Motor Activity Slow  Appearance/Hygiene In scrubs  Behavior Characteristics Cooperative  Mood Pleasant  Thought Process  Coherency WDL  Content WDL  Delusions None reported or observed  Perception WDL  Hallucination None reported or observed  Judgment Impaired  Confusion Mild  Danger to Self  Current suicidal ideation? Denies

## 2024-05-12 NOTE — Plan of Care (Signed)
  Problem: Activity: Goal: Sleeping patterns will improve Outcome: Progressing   

## 2024-05-12 NOTE — Progress Notes (Signed)
 [facility] MD Progress Note  05/12/2024 9:01 PM Emin Foree  MRN:  [id number] Clarion Mooneyhan is a 78 year old male who presents to the inpatient geriatric psych unit after jumping from a two story building. Patient was originally seen at [facility] who referred him to the ED who admitted him here on 03/27/2024. Patient reports that people from the [place] [place] [employer] gang were trying to kill him and the only escape was through the bedroom window on the second floor. He currently lives in the house with [name] and his family and has been living with them for 3-4 years without any problems. On 03/19/2024 he went to the doctor and when he got back home the family wouldn't let him leave. He states that after he realized the gang was going to kill him he barricaded the door with his bed and jumped out the window. When he landed he ran away until one of his neighbors found him and called 911.   Subjective:   Per nursing report patient doing well.  Not reporting any statements about being poisoned.  Patient on interview reports that he had constipation but had little bowel movement with the medication.  Patient denies any suicidal homicidal ideations.    Able to understand medical problem Patient is unable to display any understanding of his medical problems or the current presentation leading up to admission Able to understand proposed treatment  Patient is unable to understand the proposed treatment with antipsychotics Patient remains paranoid and has cognitive impairment as he is unable to discuss his discharge planning.  Given his paranoia and delay in executive function patient is unable to make decisions regarding the treatment and disposition.  Sleep: Fair  Appetite:  Fair  Past Psychiatric History: see h&P  Family History:  Family History  Problem Relation Age of Onset   Colon cancer Neg Hx    Colon polyps Neg Hx    Stomach cancer Neg Hx    Esophageal cancer Neg Hx    Social History:   Social History   Substance and Sexual Activity  Alcohol Use No     Social History   Substance and Sexual Activity  Drug Use No    Social History   Socioeconomic History   Marital status: Single    Spouse name: Not on file   Number of children: Not on file   Years of education: Not on file   Highest education level: Not on file  Occupational History   Not on file  Tobacco Use   Smoking status: Former    Types: Cigarettes   Smokeless tobacco: Never  Vaping Use   Vaping status: Never Used  Substance and Sexual Activity   Alcohol use: No   Drug use: No   Sexual activity: Not on file  Other Topics Concern   Not on file  Social History Narrative   Not on file   Social Drivers of Health   Financial Resource Strain: Not on file  Food Insecurity: No Food Insecurity (03/27/2024)   Hunger Vital Sign    Worried About Running Out of Food in the Last Year: Never true    Ran Out of Food in the Last Year: Never true  Transportation Needs: No Transportation Needs (03/27/2024)   PRAPARE - Administrator, Civil Service (Medical): No    Lack of Transportation (Non-Medical): No  Physical Activity: Not on file  Stress: Not on file  Social Connections: Moderately Integrated (03/27/2024)   Social Connection and Isolation  Panel    Frequency of Communication with Friends and Family: Twice a week    Frequency of Social Gatherings with Friends and Family: Once a week    Attends Religious Services: 1 to 4 times per year    Active Member of [employer] or Organizations: No    Attends Engineer, structural: 1 to 4 times per year    Marital Status: Divorced   Past Medical History:  Past Medical History:  Diagnosis Date   Allergy    Arthritis    back   Prostate cancer (HCC) dx'd 2010   surg only    Past Surgical History:  Procedure Laterality Date   COLONOSCOPY     EYE SURGERY     PROSTATE SURGERY      Current Medications: Current Facility-Administered Medications   Medication Dose Route Frequency Provider Last Rate Last Admin   acetaminophen  (TYLENOL ) tablet 650 mg  650 mg Oral Q6H PRN Coleman, Carolyn H, NP       alum & mag hydroxide-simeth (MAALOX/MYLANTA) 200-200-20 MG/5ML suspension 30 mL  30 mL Oral Q4H PRN Coleman, Carolyn H, NP       docusate sodium  (COLACE) capsule 100 mg  100 mg Oral Daily Jadapalle, Sree, MD   100 mg at 05/12/24 0751   fluPHENAZine  (PROLIXIN ) tablet 10 mg  10 mg Oral BID Alaric Gladwin, MD   10 mg at 05/12/24 2048   Or   fluPHENAZine  (PROLIXIN ) injection 5 mg  5 mg Intramuscular BID Shakesha Soltau, MD       magnesium  hydroxide (MILK OF MAGNESIA) suspension 30 mL  30 mL Oral Daily PRN Mardy Elveria DEL, NP       OLANZapine  (ZYPREXA ) injection 5 mg  5 mg Intramuscular TID PRN Mardy Elveria DEL, NP   5 mg at 04/12/24 2230   OLANZapine  zydis (ZYPREXA ) disintegrating tablet 5 mg  5 mg Oral TID PRN Mardy Elveria DEL, NP       polyethylene glycol (MIRALAX  / GLYCOLAX ) packet 17 g  17 g Oral Daily PRN Donnelly Mellow, MD   17 g at 05/10/24 0830    Lab Results:  No results found for this or any previous visit (from the past 48 hours).   Blood Alcohol level:  Lab Results  Component Value Date   [facility] <15 03/26/2024    Metabolic Disorder Labs: Lab Results  Component Value Date   HGBA1C 5.6 04/02/2024   MPG 114 04/02/2024   No results found for: PROLACTIN Lab Results  Component Value Date   CHOL 146 04/02/2024   TRIG 37 04/02/2024   HDL 43 04/02/2024   CHOLHDL 3.4 04/02/2024   VLDL 7 04/02/2024   LDLCALC 96 04/02/2024    Physical Findings: AIMS:  , ,  ,  ,    CIWA:    COWS:      Psychiatric Specialty Exam:  Presentation  General Appearance:  Appropriate for Environment; Bizarre  Eye Contact: Fair  Speech: Clear and Coherent  Speech Volume: Normal    Mood and Affect  Mood: Anxious  Affect: Congruent   Thought Process  Thought Processes: Disorganized  Descriptions of  Associations:Intact  Orientation:Full (Time, Place and Person)  Thought Content: Paranoia at baseline Hallucinations: Denies  Ideas of Reference: Paranoia at baseline Suicidal Thoughts: Denies  Homicidal Thoughts: Denies   Sensorium  Memory: Immediate Fair; Recent Fair; Remote Fair  Judgment: Impaired  Insight: Shallow   Executive Functions  Concentration: Fair  Attention Span: Fair  Recall: Fair  Fund of Knowledge: Fair  Language: Fair   Psychomotor Activity  Psychomotor Activity: No data recorded  Musculoskeletal: Strength & Muscle Tone: within normal limits Gait & Station: normal Assets  Assets: Manufacturing systems engineer; Resilience    Physical Exam: Physical Exam Vitals and nursing note reviewed.    ROS Blood pressure 119/88, pulse 74, temperature 98.2 F (36.8 C), resp. rate 18, height 6' (1.829 m), weight 100 kg, SpO2 98%. Body mass index is 29.91 kg/m.  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  Patient needs to be monitored closely for ongoing psychosis and paranoid delusions.  Given patient's confusion and chronic paranoia, no family support, APS report has been made and it got screened in for further evaluation by APS.   Differential diagnosis include: unspecified psychosis, delusion disorder, dementia, schizophrenia/schizoaffective disorder   Treatment Plan Summary: Requested the social work team to reach out to APS for safety concerns in the community as patient has chronic paranoia and confusion and is currently homeless patient remains confused and paranoid and spite of being compliant with medications.  Patient displays cognitive delay and deficits in executive function like planning, organizing and processing  information.  At this point patient lacks capacity to make medical decisions.  We recommend APS referral for possible legal guardianship as he has no family or legal next of kin to help with decision-making.   Safety and Monitoring:             -- InVoluntary admission to inpatient psychiatric unit for safety, stabilization and treatment             -- Daily contact with patient to assess and evaluate symptoms and progress in treatment             -- Patient's case to be discussed in multi-disciplinary team meeting             -- Observation Level: q15 minute checks             -- Vital signs:  q12 hours             -- Precautions: suicide, elopement, and assault   2. Psychiatric Diagnoses and Treatment:              Continue Prolixin  to 10 mg twice daily. discontinued Zyprexa .  -- The risks/benefits/side-effects/alternatives to this medication were discussed in detail with the patient and time was given for questions. The patient consents to medication trial.                -- Metabolic profile and EKG monitoring obtained while on an atypical antipsychotic (BMI: Lipid Panel: HbgA1c: QTc:)              -- Encouraged patient to participate in unit milieu and in scheduled group therapies                            3. Medical Issues Being Addressed:    No urgent medical needs noted  4. Discharge Planning:   -- Social work and case management to assist with discharge planning and identification of hospital follow-up needs prior to discharge  -- Estimated LOS: 3-4 days  Desmond Chimera, MD 05/12/2024, 9:01 PM

## 2024-05-12 NOTE — Progress Notes (Signed)
 No new complaints or issues on the shift pleasant and cooperative. The patient was compliant with medications on the shift. He denies SI, HI & AVH.

## 2024-05-13 DIAGNOSIS — F22 Delusional disorders: Secondary | ICD-10-CM | POA: Diagnosis not present

## 2024-05-13 NOTE — Progress Notes (Signed)
   05/13/24 2030  Psych Admission Type (Psych Patients Only)  Admission Status Involuntary  Psychosocial Assessment  Patient Complaints None  Eye Contact Brief  Facial Expression Blank  Affect Appropriate to circumstance  Speech Soft  Interaction Isolative  Motor Activity Slow  Appearance/Hygiene In scrubs  Behavior Characteristics Cooperative  Mood Pleasant  Thought Process  Coherency WDL  Content WDL  Delusions None reported or observed  Perception WDL  Hallucination None reported or observed  Judgment Impaired  Confusion Mild  Danger to Self  Current suicidal ideation? Denies  Agreement Not to Harm Self Yes  Description of Agreement verbal

## 2024-05-13 NOTE — Group Note (Signed)
 LCSW Group Therapy Note  Group Date: 05/13/2024 Start Time: 1300 End Time: 1340   Type of Therapy and Topic:  Group Therapy - Healthy vs Unhealthy Coping Skills  Participation Level:  Did Not Attend   Description of Group The focus of this group was to determine what unhealthy coping techniques typically are used by group members and what healthy coping techniques would be helpful in coping with various problems. Patients were guided in becoming aware of the differences between healthy and unhealthy coping techniques. Patients were asked to identify 2-3 healthy coping skills they would like to learn to use more effectively.  Therapeutic Goals Patients learned that coping is what human beings do all day long to deal with various situations in their lives Patients defined and discussed healthy vs unhealthy coping techniques Patients identified their preferred coping techniques and identified whether these were healthy or unhealthy Patients determined 2-3 healthy coping skills they would like to become more familiar with and use more often. Patients provided support and ideas to each other   Summary of Patient Progress:  The patient did not attend group.   Therapeutic Modalities Cognitive Behavioral Therapy   Renella Steig S Amberle Lyter, LCSWA 05/13/2024  4:34 PM

## 2024-05-13 NOTE — Progress Notes (Signed)
   05/13/24 1200  Psych Admission Type (Psych Patients Only)  Admission Status Involuntary  Psychosocial Assessment  Patient Complaints None  Eye Contact Brief  Facial Expression Blank  Affect Appropriate to circumstance  Speech Soft  Interaction Isolative  Motor Activity Slow  Appearance/Hygiene In scrubs  Behavior Characteristics Cooperative  Mood Pleasant  Thought Process  Coherency WDL  Content WDL  Delusions None reported or observed  Perception WDL  Hallucination None reported or observed  Judgment Impaired  Confusion Mild  Danger to Self  Current suicidal ideation? Denies

## 2024-05-13 NOTE — Group Note (Signed)
 Date:  05/13/2024 Time:  9:42 PM  Group Topic/Focus:  Wrap-Up Group:   The focus of this group is to help patients review their daily goal of treatment and discuss progress on daily workbooks.    Participation Level:  Active  Participation Quality:  Appropriate  Affect:  Appropriate  Cognitive:  Appropriate  Insight: Appropriate  Engagement in Group:  Engaged  Modes of Intervention:  Discussion  Additional Comments:    [name] 05/13/2024, 9:42 PM

## 2024-05-13 NOTE — Progress Notes (Signed)
 [facility] MD Progress Note  05/13/2024 2:10 PM Benjamin [name]  MRN:  [id number] Benjamin [name] is a 78 year old male who presents to the inpatient geriatric psych unit after jumping from a two story building. Patient was originally seen at [facility] who referred him to the ED who admitted him here on 03/27/2024. Patient reports that people from the [place] gang were trying to kill him and the only escape was through the bedroom window on the second floor. He currently lives in the house with [name] and his family and has been living with them for 3-4 years without any problems. On 03/19/2024 he went to the doctor and when he got back home the family wouldn't let him leave. He states that after he realized the gang was going to kill him he barricaded the door with his bed and jumped out the window. When he landed he ran away until one of his neighbors found him and called 911.   Subjective:   Patient had a bowel movement yesterday.  Is on stool softener on a daily basis.  Patient denies any suicidal ideations or homicidal ideations or any auditory or visual hallucinations.  No overt psychosis noted.  Social worker working on the discharge plan.    Able to understand medical problem Patient is unable to display any understanding of his medical problems or the current presentation leading up to admission Able to understand proposed treatment  Patient is unable to understand the proposed treatment with antipsychotics Patient remains paranoid and has cognitive impairment as he is unable to discuss his discharge planning.  Given his paranoia and delay in executive function patient is unable to make decisions regarding the treatment and disposition.  Sleep: Fair  Appetite:  Fair  Past Psychiatric History: see h&P  Family History:  Family History  Problem Relation Age of Onset   Colon cancer Neg Hx    Colon polyps Neg Hx    Stomach cancer Neg Hx    Esophageal cancer Neg Hx     Social History:  Social History   Substance and Sexual Activity  Alcohol Use No     Social History   Substance and Sexual Activity  Drug Use No    Social History   Socioeconomic History   Marital status: Single    Spouse name: Not on file   Number of children: Not on file   Years of education: Not on file   Highest education level: Not on file  Occupational History   Not on file  Tobacco Use   Smoking status: Former    Types: Cigarettes   Smokeless tobacco: Never  Vaping Use   Vaping status: Never Used  Substance and Sexual Activity   Alcohol use: No   Drug use: No   Sexual activity: Not on file  Other Topics Concern   Not on file  Social History Narrative   Not on file   Social Drivers of Health   Financial Resource Strain: Not on file  Food Insecurity: No Food Insecurity (03/27/2024)   Hunger Vital Sign    Worried About Running Out of Food in the Last Year: Never true    Ran Out of Food in the Last Year: Never true  Transportation Needs: No Transportation Needs (03/27/2024)   PRAPARE - Administrator, Civil Service (Medical): No    Lack of Transportation (Non-Medical): No  Physical Activity: Not on file  Stress: Not on file  Social Connections: Moderately Integrated (03/27/2024)  Social Advertising account executive    Frequency of Communication with Friends and Family: Twice a week    Frequency of Social Gatherings with Friends and Family: Once a week    Attends Religious Services: 1 to 4 times per year    Active Member of [employer] or Organizations: No    Attends Engineer, structural: 1 to 4 times per year    Marital Status: Divorced   Past Medical History:  Past Medical History:  Diagnosis Date   Allergy    Arthritis    back   Prostate cancer (HCC) dx'd 2010   surg only    Past Surgical History:  Procedure Laterality Date   COLONOSCOPY     EYE SURGERY     PROSTATE SURGERY      Current Medications: Current  Facility-Administered Medications  Medication Dose Route Frequency Provider Last Rate Last Admin   acetaminophen  (TYLENOL ) tablet 650 mg  650 mg Oral Q6H PRN Coleman, Carolyn H, NP       alum & mag hydroxide-simeth (MAALOX/MYLANTA) 200-200-20 MG/5ML suspension 30 mL  30 mL Oral Q4H PRN Coleman, Carolyn H, NP       docusate sodium  (COLACE) capsule 100 mg  100 mg Oral Daily Jadapalle, Sree, MD   100 mg at 05/13/24 9253   fluPHENAZine  (PROLIXIN ) tablet 10 mg  10 mg Oral BID Patrik Turnbaugh, MD   10 mg at 05/13/24 0745   Or   fluPHENAZine  (PROLIXIN ) injection 5 mg  5 mg Intramuscular BID Aryella Besecker, MD       magnesium  hydroxide (MILK OF MAGNESIA) suspension 30 mL  30 mL Oral Daily PRN Mardy Elveria DEL, NP       OLANZapine  (ZYPREXA ) injection 5 mg  5 mg Intramuscular TID PRN Mardy Elveria DEL, NP   5 mg at 04/12/24 2230   OLANZapine  zydis (ZYPREXA ) disintegrating tablet 5 mg  5 mg Oral TID PRN Mardy Elveria DEL, NP       polyethylene glycol (MIRALAX  / GLYCOLAX ) packet 17 g  17 g Oral Daily PRN Donnelly Mellow, MD   17 g at 05/10/24 0830    Lab Results:  No results found for this or any previous visit (from the past 48 hours).   Blood Alcohol level:  Lab Results  Component Value Date   [facility] <15 03/26/2024    Metabolic Disorder Labs: Lab Results  Component Value Date   HGBA1C 5.6 04/02/2024   MPG 114 04/02/2024   No results found for: PROLACTIN Lab Results  Component Value Date   CHOL 146 04/02/2024   TRIG 37 04/02/2024   HDL 43 04/02/2024   CHOLHDL 3.4 04/02/2024   VLDL 7 04/02/2024   LDLCALC 96 04/02/2024    Physical Findings: AIMS:  , ,  ,  ,    CIWA:    COWS:      Psychiatric Specialty Exam:  Presentation  General Appearance:  Appropriate for Environment; Bizarre  Eye Contact: Fair  Speech: Clear and Coherent  Speech Volume: Normal    Mood and Affect  Mood: Anxious  Affect: Congruent   Thought Process  Thought  Processes: Disorganized  Descriptions of Associations:Intact  Orientation:Full (Time, Place and Person)  Thought Content: Paranoia at baseline Hallucinations: Denies  Ideas of Reference: Paranoia at baseline Suicidal Thoughts: Denies  Homicidal Thoughts: Denies   Sensorium  Memory: Immediate Fair; Recent Fair; Remote Fair  Judgment: Impaired  Insight: Shallow   Executive Functions  Concentration: Fair  Attention Span: Fair  Recall: Dotti Abe of Knowledge: Fair  Language: Fair   Psychomotor Activity  Psychomotor Activity: No data recorded  Musculoskeletal: Strength & Muscle Tone: within normal limits Gait & Station: normal Assets  Assets: Manufacturing systems engineer; Resilience    Physical Exam: Physical Exam Vitals and nursing note reviewed.    ROS Blood pressure 95/63, pulse 74, temperature 98 F (36.7 C), resp. rate 18, height 6' (1.829 m), weight 100 kg, SpO2 100%. Body mass index is 29.91 kg/m.  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  Patient needs to be monitored closely for ongoing psychosis and paranoid delusions.  Given patient's confusion and chronic paranoia, no family support, APS report has been made and it got screened in for further evaluation by APS.   Differential diagnosis include: unspecified psychosis, delusion disorder, dementia, schizophrenia/schizoaffective disorder   Treatment Plan Summary: Requested the social work team to reach out to APS for safety concerns in the community as patient has chronic paranoia and confusion and is currently homeless patient remains confused and paranoid and spite of being compliant with medications.  Patient displays cognitive delay and deficits in executive function  like planning, organizing and processing information.  At this point patient lacks capacity to make medical decisions.  We recommend APS referral for possible legal guardianship as he has no family or legal next of kin to help with decision-making.   Safety and Monitoring:             -- InVoluntary admission to inpatient psychiatric unit for safety, stabilization and treatment             -- Daily contact with patient to assess and evaluate symptoms and progress in treatment             -- Patient's case to be discussed in multi-disciplinary team meeting             -- Observation Level: q15 minute checks             -- Vital signs:  q12 hours             -- Precautions: suicide, elopement, and assault   2. Psychiatric Diagnoses and Treatment:              Continue Prolixin  to 10 mg twice daily. discontinued Zyprexa .  -- The risks/benefits/side-effects/alternatives to this medication were discussed in detail with the patient and time was given for questions. The patient consents to medication trial.                -- Metabolic profile and EKG monitoring obtained while on an atypical antipsychotic (BMI: Lipid Panel: HbgA1c: QTc:)              -- Encouraged patient to participate in unit milieu and in scheduled group therapies                            3. Medical Issues Being Addressed:    No urgent medical needs noted  4. Discharge Planning:   -- Social work and case management to assist with discharge planning and identification of hospital follow-up needs prior to discharge  -- Estimated LOS: 3-4 days  Desmond Chimera, MD 05/13/2024, 2:10 PM

## 2024-05-13 NOTE — Group Note (Signed)
 Date:  05/13/2024 Time:  11:28 AM  Group Topic/Focus:  Healthy Communication:   The focus of this group is to discuss communication, barriers to communication, as well as healthy ways to communicate with others.    Participation Level:  Active  Participation Quality:  Appropriate  Affect:  Appropriate  Cognitive:  Appropriate  Insight: Appropriate  Engagement in Group:  Engaged  Modes of Intervention:  Activity  Additional Comments:    Areg Bialas 05/13/2024, 11:28 AM

## 2024-05-13 NOTE — Plan of Care (Signed)
  Problem: Education: Goal: Knowledge of General Education information will improve Description: Including pain rating scale, medication(s)/side effects and non-pharmacologic comfort measures Outcome: Progressing   Problem: Health Behavior/Discharge Planning: Goal: Ability to manage health-related needs will improve Outcome: Progressing   Problem: Clinical Measurements: Goal: Ability to maintain clinical measurements within normal limits will improve Outcome: Progressing Goal: Will remain free from infection Outcome: Progressing Goal: Diagnostic test results will improve Outcome: Progressing Goal: Respiratory complications will improve Outcome: Progressing Goal: Cardiovascular complication will be avoided Outcome: Progressing   Problem: Activity: Goal: Risk for activity intolerance will decrease Outcome: Progressing   Problem: Nutrition: Goal: Adequate nutrition will be maintained Outcome: Progressing   Problem: Coping: Goal: Level of anxiety will decrease Outcome: Progressing   Problem: Elimination: Goal: Will not experience complications related to bowel motility Outcome: Progressing Goal: Will not experience complications related to urinary retention Outcome: Progressing   Problem: Pain Managment: Goal: General experience of comfort will improve and/or be controlled Outcome: Progressing   Problem: Safety: Goal: Ability to remain free from injury will improve Outcome: Progressing   Problem: Skin Integrity: Goal: Risk for impaired skin integrity will decrease Outcome: Progressing   Problem: Education: Goal: Knowledge of [place] [facility] Education information/materials will improve Outcome: Progressing Goal: Emotional status will improve Outcome: Progressing Goal: Mental status will improve Outcome: Progressing Goal: Verbalization of understanding the information provided will improve Outcome: Progressing   Problem: Activity: Goal: Interest or  engagement in activities will improve Outcome: Progressing Goal: Sleeping patterns will improve Outcome: Progressing   Problem: Coping: Goal: Ability to verbalize frustrations and anger appropriately will improve Outcome: Progressing Goal: Ability to demonstrate self-control will improve Outcome: Progressing   Problem: Health Behavior/Discharge Planning: Goal: Identification of resources available to assist in meeting health care needs will improve Outcome: Progressing Goal: Compliance with treatment plan for underlying cause of condition will improve Outcome: Progressing   Problem: Physical Regulation: Goal: Ability to maintain clinical measurements within normal limits will improve Outcome: Progressing   Problem: Safety: Goal: Periods of time without injury will increase Outcome: Progressing

## 2024-05-14 DIAGNOSIS — F22 Delusional disorders: Secondary | ICD-10-CM | POA: Diagnosis not present

## 2024-05-14 NOTE — Plan of Care (Signed)
  Problem: Skin Integrity: Goal: Risk for impaired skin integrity will decrease Outcome: Progressing   Problem: Health Behavior/Discharge Planning: Goal: Compliance with treatment plan for underlying cause of condition will improve Outcome: Progressing   Problem: Safety: Goal: Periods of time without injury will increase Outcome: Progressing

## 2024-05-14 NOTE — Group Note (Signed)
 Date:  05/14/2024 Time:  4:36 PM  Group Topic/Focus:  Coping With Mental Health Crisis:   The purpose of this group is to help patients identify strategies for coping with mental health crisis.  Group discusses possible causes of crisis and ways to manage them effectively.    Participation Level:  Did Not Attend  Participation Quality:  Appropriate  Affect:  Appropriate  Cognitive:  Appropriate  Insight: Appropriate  Engagement in Group:  Engaged and Improving  Modes of Intervention:    Additional Comments:  PT was outside but did not participate in group.  Inaara Tye 05/14/2024, 4:36 PM

## 2024-05-14 NOTE — BH IP Treatment Plan (Signed)
 Interdisciplinary Treatment and Diagnostic Plan Update  05/14/2024 Time of Session: 9:00 AM Benjamin [name] MRN: [id number]  Principal Diagnosis: Delusional disorder [facility])  Secondary Diagnoses: Principal Problem:   Delusional disorder (HCC)   Current Medications:  Current Facility-Administered Medications  Medication Dose Route Frequency Provider Last Rate Last Admin   acetaminophen  (TYLENOL ) tablet 650 mg  650 mg Oral Q6H PRN Coleman, Carolyn H, NP       alum & mag hydroxide-simeth (MAALOX/MYLANTA) 200-200-20 MG/5ML suspension 30 mL  30 mL Oral Q4H PRN Coleman, Carolyn H, NP       docusate sodium  (COLACE) capsule 100 mg  100 mg Oral Daily Jadapalle, Sree, MD   100 mg at 05/14/24 9065   fluPHENAZine  (PROLIXIN ) tablet 10 mg  10 mg Oral BID Shrivastava, Aryendra, MD   10 mg at 05/14/24 9065   Or   fluPHENAZine  (PROLIXIN ) injection 5 mg  5 mg Intramuscular BID Shrivastava, Aryendra, MD       magnesium  hydroxide (MILK OF MAGNESIA) suspension 30 mL  30 mL Oral Daily PRN Coleman, Carolyn H, NP       OLANZapine  (ZYPREXA ) injection 5 mg  5 mg Intramuscular TID PRN Mardy Elveria DEL, NP   5 mg at 04/12/24 2230   OLANZapine  zydis (ZYPREXA ) disintegrating tablet 5 mg  5 mg Oral TID PRN Coleman, Carolyn H, NP       polyethylene glycol (MIRALAX  / GLYCOLAX ) packet 17 g  17 g Oral Daily PRN Donnelly Mellow, MD   17 g at 05/10/24 0830   PTA Medications: Medications Prior to Admission  Medication Sig Dispense Refill Last Dose/Taking   docusate sodium  (COLACE) 100 MG capsule Take 100 mg by mouth daily.      omega-3 acid ethyl esters (LOVAZA) 1 g capsule Take 1 g by mouth daily.       Patient Stressors: Traumatic event    Patient Strengths: Communication skills   Treatment Modalities: Medication Management, Group therapy, Case management,  1 to 1 session with clinician, Psychoeducation, Recreational therapy.   Physician Treatment Plan for Primary Diagnosis: Delusional disorder [facility]) Long Term  Goal(s): Improvement in symptoms so as ready for discharge   Short Term Goals: Ability to identify changes in lifestyle to reduce recurrence of condition will improve Ability to verbalize feelings will improve Ability to disclose and discuss suicidal ideas Ability to demonstrate self-control will improve Ability to identify and develop effective coping behaviors will improve  Medication Management: Evaluate patient's response, side effects, and tolerance of medication regimen.  Therapeutic Interventions: 1 to 1 sessions, Unit Group sessions and Medication administration.  Evaluation of Outcomes: Progressing  Physician Treatment Plan for Secondary Diagnosis: Principal Problem:   Delusional disorder (HCC)  Long Term Goal(s): Improvement in symptoms so as ready for discharge   Short Term Goals: Ability to identify changes in lifestyle to reduce recurrence of condition will improve Ability to verbalize feelings will improve Ability to disclose and discuss suicidal ideas Ability to demonstrate self-control will improve Ability to identify and develop effective coping behaviors will improve     Medication Management: Evaluate patient's response, side effects, and tolerance of medication regimen.  Therapeutic Interventions: 1 to 1 sessions, Unit Group sessions and Medication administration.  Evaluation of Outcomes: Progressing   RN Treatment Plan for Primary Diagnosis: Delusional disorder (HCC) Long Term Goal(s): Knowledge of disease and therapeutic regimen to maintain health will improve  Short Term Goals: Ability to identify changes in lifestyle to reduce recurrence of condition will improve Ability to verbalize  feelings will improve Ability to disclose and discuss suicidal ideas Ability to demonstrate self-control will improve Ability to identify and develop effective coping behaviors will improve  Medication Management: RN will administer medications as ordered by provider, will  assess and evaluate patient's response and provide education to patient for prescribed medication. RN will report any adverse and/or side effects to prescribing provider.  Therapeutic Interventions: 1 on 1 counseling sessions, Psychoeducation, Medication administration, Evaluate responses to treatment, Monitor vital signs and CBGs as ordered, Perform/monitor CIWA, COWS, AIMS and Fall Risk screenings as ordered, Perform wound care treatments as ordered.  Evaluation of Outcomes: Progressing   LCSW Treatment Plan for Primary Diagnosis: Delusional disorder [facility]) Long Term Goal(s): Safe transition to appropriate next level of care at discharge, Engage patient in therapeutic group addressing interpersonal concerns.  Short Term Goals:  Engage patient in aftercare planning with referrals and resources, Increase social support, Increase ability to appropriately verbalize feelings, Increase emotional regulation, Facilitate acceptance of mental health diagnosis and concerns, and Increase skills for wellness and recovery   Therapeutic Interventions: Assess for all discharge needs, 1 to 1 time with Social worker, Explore available resources and support systems, Assess for adequacy in community support network, Educate family and significant other(s) on suicide prevention, Complete Psychosocial Assessment, Interpersonal group therapy.  Evaluation of Outcomes: Progressing   Progress in Treatment: Attending groups: Yes. 05/14/24 Update: Yes.  Participating in groups: Yes. 05/14/24 Update: Yes.  Taking medication as prescribed: Yes. 05/14/24 Update: Yes.  Toleration medication: Yes. 05/14/24 Update: Yes.  Family/Significant other contact made: Yes, individual(s) contacted:  SPE completed with the patient's brother. 05/14/24 Update: Yes, SPE completed with patient's brother. 05/14/24 Update: Yes, SPE completed.  Patient understands diagnosis: No. 05/14/24 Update: No.  Discussing patient identified  problems/goals with staff: Yes. 05/14/24 Update: Yes.  Medical problems stabilized or resolved: Yes. 05/14/24 Update: Yes.  Denies suicidal/homicidal ideation: Yes. 05/14/24 Update: Yes.  Issues/concerns per patient self-inventory: No.  05/14/24 Update: No.  Other: none 05/14/24 Update: None.   New problem(s) identified: No, Describe:  05/14/24 Update: None  New Short Term/Long Term Goal(s): elimination of symptoms of psychosis, medication management for mood stabilization; elimination of SI thoughts; development of comprehensive mental wellness plan. Update 04/02/24: No changes at this time. Update 04/07/24: No changes at this time. Update 04/12/24: No changes at this time Update 04/18/24: No changes at this time  Update 04/23/24: No changes at this time. Update 04/28/2024: No changes at this time.  Update 05/04/2024: No changes at this time.  Update 05/09/2024:  No changes at this time. 05/14/24 Update: No changes at this time.   Discharge Plan or Barriers: CSW will assist with appropriate discharge planning  Update 04/02/24: No changes at this time. Update 04/07/24: No changes at this time.Update 04/12/24: No changes at this time Update 04/18/24: CSW submitted report to APS. Care team looking at guardianship for pt at this time  Update 04/23/24: No changes at this time. Update 04/28/2024: No changes at this time. Update 05/04/2024: No changes at this time.  Update 05/09/2024:  [facility] APS continues to search for placement. 05/14/24 Update: [facility] APS continues to look for placement. CSW to continue to assess.    Reason for Continuation of Hospitalization: Delusions  Medication stabilization   Estimated Length of Stay: 1 to 7 days Update 04/02/24: TBD. Update 04/07/24: TBD Update 04/12/24:TBD. Update 04/28/24:TBD Update 05/04/2024: TBD Update 05/09/2024:  TBD  05/14/24 Update: TBD  Last 3 [place] Suicide Severity Risk Score: Flowsheet  Row Admission (Current) from 03/27/2024 in [facility] BEHAVIORAL  MEDICINE ED from 03/26/2024 in [facility] Emergency Department at [facility] ED from 03/25/2024 in [facility]  C-SSRS RISK CATEGORY No Risk No Risk No Risk    Last [facility] 2/9 Scores:     No data to display          Scribe for Treatment Team: Alveta CHRISTELLA Ezzard KEN 05/14/2024 2:52 PM

## 2024-05-14 NOTE — Group Note (Signed)
 Recreation Therapy Group Note   Group Topic:Other  Group Date: 05/14/2024 Start Time: 1400 End Time: 1445 Facilitators: Celestia Jeoffrey BRAVO, LRT, CTRS Location: Dayroom  Activity Description/Intervention: Therapeutic Drumming. Patients with peers and staff were given the opportunity to engage in a leader facilitated [facility] Group Empowerment Drumming Circle with staff from the [employer], in partnership with [employer]. Teaching laboratory technician and trained [employer], Norleen Mon leading with LRT observing and documenting intervention and pt response. This evidenced-based practice targets 7 areas of health and wellbeing in the human experience including: stress-reduction, exercise, self-expression, camaraderie/support, nurturing, spirituality, and music-making (leisure).    Goal Area(s) Addresses:  Patient will engage in pro-social way in music group.  Patient will follow directions of drum leader on the first prompt. Patient will demonstrate no behavioral issues during group.  Patient will identify if a reduction in stress level occurs as a result of participation in therapeutic drum circle.     Affect/Mood: N/A   Participation Level: Non-verbal    Clinical Observations/Individualized Feedback: Benjamin [name] was present in group, however, sat away from the drumming circle and appeared asleep duration of session.   Plan: Continue to engage patient in RT group sessions 2-3x/week.   [address] LRT, CTRS 05/14/2024 4:25 PM

## 2024-05-14 NOTE — Plan of Care (Signed)
   Problem: Coping: Goal: Level of anxiety will decrease Outcome: Progressing

## 2024-05-14 NOTE — Group Note (Signed)
 Date:  05/14/2024 Time:  9:08 PM  Group Topic/Focus:  Wrap-Up Group:   The focus of this group is to help patients review their daily goal of treatment and discuss progress on daily workbooks.    Participation Level:  Active  Participation Quality:  Appropriate  Affect:  Appropriate  Cognitive:  Alert  Insight: Appropriate  Engagement in Group:  Engaged  Modes of Intervention:  Discussion  Additional Comments:    Benjamin [name] 05/14/2024, 9:08 PM

## 2024-05-14 NOTE — Group Note (Signed)
 Physical/Occupational Therapy Group Note  Group Topic: Yoga  Group Date: 05/14/2024 Start Time: 1300 End Time: 1330 Facilitators: Matisyn Cabeza, Alm Hamilton, PT   Group Description: Group participated with series of yoga poses, designed to emphasize functional standing balance, core stability, generalized flexibility and overall posture.  Incorporated deep breathing techniques with poses, working to promote relaxation, mindfulness and focus with targeted activities.   Discussed benefits of yoga in improving mood and self-esteem, reducing stress and anxiety, and promoting functional strength and balance for each participant.  Discussed ways to integrate into each participant's daily routine.  Provided handout with written and pictorial descriptions of included yoga movements to be utilized as appropriate outside of group time.  Therapeutic Goal(s):  Demonstrate safe ability to participate with yoga poses during group activity. Identify one benefit of participation with yoga poses as part of each participant's exercise/movement routine. Identify 1-2 individual poses that participant feels most beneficial to his/her needs and that he/she can easily replicate outside of group.  Individual Participation: Did not attend  Participation Level:   Participation Quality:   Behavior:   Speech/Thought Process:   Affect/Mood:   Insight:   Judgement:   Modes of Intervention:   Patient Response to Interventions:    Plan: Continue to engage patient in PT/OT groups 1 - 2x/week.  CHARM Hamilton Bertin PT, DPT 05/14/24, 1:50 PM

## 2024-05-14 NOTE — Progress Notes (Signed)
   05/14/24 2000  Psych Admission Type (Psych Patients Only)  Admission Status Involuntary  Psychosocial Assessment  Patient Complaints None  Eye Contact Brief  Facial Expression Flat  Affect Flat  Speech Soft  Interaction Isolative  Motor Activity Slow  Appearance/Hygiene In scrubs  Behavior Characteristics Cooperative  Mood Pleasant  Thought Process  Coherency WDL  Content WDL  Delusions None reported or observed  Perception WDL  Hallucination None reported or observed  Judgment Impaired  Confusion Mild  Danger to Self  Current suicidal ideation? Denies  Agreement Not to Harm Self Yes  Description of Agreement Verbal  Danger to Others  Danger to Others None reported or observed

## 2024-05-14 NOTE — Progress Notes (Signed)
 Mood:   Pleasant and cooperative.     Psych assessment: Denies SI/HI and AVH.   Interaction / Group attendance:  Isolates to room with exception of meals.  Minimal interaction with peers and staff.   Medication/ PRNs: Compliant with scheduled medications.  No PRNs needed.  Pain: Denies  15 min checks in place for safety.

## 2024-05-14 NOTE — Progress Notes (Signed)
 [facility] MD Progress Note  05/14/2024 8:07 PM Benjamin [name]  MRN:  [id number] Benjamin [name] is a 78 year old male who presents to the inpatient geriatric psych unit after jumping from a two story building. Patient was originally seen at [facility] who referred him to the ED who admitted him here on 03/27/2024. Patient reports that people from the [place] gang were trying to kill him and the only escape was through the bedroom window on the second floor. He currently lives in the house with [name] and his family and has been living with them for 3-4 years without any problems. On 03/19/2024 he went to the doctor and when he got back home the family wouldn't let him leave. He states that after he realized the gang was going to kill him he barricaded the door with his bed and jumped out the window. When he landed he ran away until one of his neighbors found him and called 911.   Subjective: Chart is reviewed and discussed with the treatment team.  Per nursing report patient is doing well with no behavioral problems on the unit.  Patient is taking his medications with no reported side effects.  Patient denies SI/HI/plan and denies hallucinations.     Able to understand medical problem Patient is unable to display any understanding of his medical problems or the current presentation leading up to admission Able to understand proposed treatment  Patient is unable to understand the proposed treatment with antipsychotics Patient remains paranoid and has cognitive impairment as he is unable to discuss his discharge planning.  Given his paranoia and delay in executive function patient is unable to make decisions regarding the treatment and disposition.    Sleep: Fair  Appetite:  Fair  Past Psychiatric History: see h&P  Family History:  Family History  Problem Relation Age of Onset   Colon cancer Neg Hx    Colon polyps Neg Hx    Stomach cancer Neg Hx    Esophageal cancer Neg Hx     Social History:  Social History   Substance and Sexual Activity  Alcohol Use No     Social History   Substance and Sexual Activity  Drug Use No    Social History   Socioeconomic History   Marital status: Single    Spouse name: Not on file   Number of children: Not on file   Years of education: Not on file   Highest education level: Not on file  Occupational History   Not on file  Tobacco Use   Smoking status: Former    Types: Cigarettes   Smokeless tobacco: Never  Vaping Use   Vaping status: Never Used  Substance and Sexual Activity   Alcohol use: No   Drug use: No   Sexual activity: Not on file  Other Topics Concern   Not on file  Social History Narrative   Not on file   Social Drivers of Health   Financial Resource Strain: Not on file  Food Insecurity: No Food Insecurity (03/27/2024)   Hunger Vital Sign    Worried About Running Out of Food in the Last Year: Never true    Ran Out of Food in the Last Year: Never true  Transportation Needs: No Transportation Needs (03/27/2024)   PRAPARE - Administrator, Civil Service (Medical): No    Lack of Transportation (Non-Medical): No  Physical Activity: Not on file  Stress: Not on file  Social Connections: Moderately Integrated (03/27/2024)  Social Advertising account executive    Frequency of Communication with Friends and Family: Twice a week    Frequency of Social Gatherings with Friends and Family: Once a week    Attends Religious Services: 1 to 4 times per year    Active Member of [employer] or Organizations: No    Attends Engineer, structural: 1 to 4 times per year    Marital Status: Divorced   Past Medical History:  Past Medical History:  Diagnosis Date   Allergy    Arthritis    back   Prostate cancer (HCC) dx'd 2010   surg only    Past Surgical History:  Procedure Laterality Date   COLONOSCOPY     EYE SURGERY     PROSTATE SURGERY      Current Medications: Current  Facility-Administered Medications  Medication Dose Route Frequency Provider Last Rate Last Admin   acetaminophen  (TYLENOL ) tablet 650 mg  650 mg Oral Q6H PRN Coleman, Carolyn H, NP       alum & mag hydroxide-simeth (MAALOX/MYLANTA) 200-200-20 MG/5ML suspension 30 mL  30 mL Oral Q4H PRN Coleman, Carolyn H, NP       docusate sodium  (COLACE) capsule 100 mg  100 mg Oral Daily Tamiki Kuba, MD   100 mg at 05/14/24 9065   fluPHENAZine  (PROLIXIN ) tablet 10 mg  10 mg Oral BID Shrivastava, Aryendra, MD   10 mg at 05/14/24 9065   Or   fluPHENAZine  (PROLIXIN ) injection 5 mg  5 mg Intramuscular BID Shrivastava, Aryendra, MD       magnesium  hydroxide (MILK OF MAGNESIA) suspension 30 mL  30 mL Oral Daily PRN Mardy Elveria DEL, NP       OLANZapine  (ZYPREXA ) injection 5 mg  5 mg Intramuscular TID PRN Mardy Elveria DEL, NP   5 mg at 04/12/24 2230   OLANZapine  zydis (ZYPREXA ) disintegrating tablet 5 mg  5 mg Oral TID PRN Mardy Elveria DEL, NP       polyethylene glycol (MIRALAX  / GLYCOLAX ) packet 17 g  17 g Oral Daily PRN Donnelly Mellow, MD   17 g at 05/10/24 0830    Lab Results:  No results found for this or any previous visit (from the past 48 hours).   Blood Alcohol level:  Lab Results  Component Value Date   [facility] <15 03/26/2024    Metabolic Disorder Labs: Lab Results  Component Value Date   HGBA1C 5.6 04/02/2024   MPG 114 04/02/2024   No results found for: PROLACTIN Lab Results  Component Value Date   CHOL 146 04/02/2024   TRIG 37 04/02/2024   HDL 43 04/02/2024   CHOLHDL 3.4 04/02/2024   VLDL 7 04/02/2024   LDLCALC 96 04/02/2024    Physical Findings: AIMS:  , ,  ,  ,    CIWA:    COWS:      Psychiatric Specialty Exam:  Presentation  General Appearance:  Appropriate for Environment; Bizarre  Eye Contact: Fair  Speech: Clear and Coherent  Speech Volume: Normal    Mood and Affect  Mood: Anxious  Affect: Congruent   Thought Process  Thought  Processes: Disorganized  Descriptions of Associations:Intact  Orientation:Full (Time, Place and Person)  Thought Content: Paranoia at baseline Hallucinations: Denies  Ideas of Reference: Paranoia at baseline Suicidal Thoughts: Denies  Homicidal Thoughts: Denies   Sensorium  Memory: Immediate Fair; Recent Fair; Remote Fair  Judgment: Impaired  Insight: Shallow   Executive Functions  Concentration: Fair  Attention Span: Fair  Recall: Dotti Abe of Knowledge: Fair  Language: Fair   Psychomotor Activity  Psychomotor Activity: No data recorded  Musculoskeletal: Strength & Muscle Tone: within normal limits Gait & Station: normal Assets  Assets: Manufacturing systems engineer; Resilience    Physical Exam: Physical Exam Vitals and nursing note reviewed.    ROS Blood pressure 108/73, pulse 65, temperature 98.1 F (36.7 C), resp. rate 14, height 6' (1.829 m), weight 100 kg, SpO2 100%. Body mass index is 29.91 kg/m.  Diagnosis: Principal Problem:   Delusional disorder [facility])    Clinical Decision Making: Patient currently admitted after jumping off a two-story building in the context of possible delusions as reported as the people in the house he lived for 4 years trying to kill him.  Patient needs to be monitored closely for ongoing psychosis and paranoid delusions.  Given patient's confusion and chronic paranoia, no family support, APS report has been made and it got screened in for further evaluation by APS.   Differential diagnosis include: unspecified psychosis, delusion disorder, dementia, schizophrenia/schizoaffective disorder   Treatment Plan Summary: Requested the social work team to reach out to APS for safety concerns in the community as patient has chronic paranoia and confusion and is currently homeless patient remains confused and paranoid and spite of being compliant with medications.  Patient displays cognitive delay and deficits in executive  function like planning, organizing and processing information.  At this point patient lacks capacity to make medical decisions.  We recommend APS referral for possible legal guardianship as he has no family or legal next of kin to help with decision-making.   Safety and Monitoring:             -- InVoluntary admission to inpatient psychiatric unit for safety, stabilization and treatment             -- Daily contact with patient to assess and evaluate symptoms and progress in treatment             -- Patient's case to be discussed in multi-disciplinary team meeting             -- Observation Level: q15 minute checks             -- Vital signs:  q12 hours             -- Precautions: suicide, elopement, and assault   2. Psychiatric Diagnoses and Treatment:              Continue Prolixin  to 10 mg twice daily. discontinued Zyprexa .  -- The risks/benefits/side-effects/alternatives to this medication were discussed in detail with the patient and time was given for questions. The patient consents to medication trial.                -- Metabolic profile and EKG monitoring obtained while on an atypical antipsychotic (BMI: Lipid Panel: HbgA1c: QTc:)              -- Encouraged patient to participate in unit milieu and in scheduled group therapies                            3. Medical Issues Being Addressed:    No urgent medical needs noted  4. Discharge Planning:   -- Social work and case management to assist with discharge planning and identification of hospital follow-up needs prior to discharge  -- Estimated LOS: 3-4 days  Allyn Foil, MD 05/14/2024, 8:07 PM

## 2024-05-15 NOTE — Plan of Care (Signed)
  Problem: Nutrition: Goal: Adequate nutrition will be maintained Outcome: Progressing   Problem: Coping: Goal: Level of anxiety will decrease Outcome: Progressing   Problem: Skin Integrity: Goal: Risk for impaired skin integrity will decrease Outcome: Progressing   Problem: Education: Goal: Emotional status will improve Outcome: Progressing

## 2024-05-15 NOTE — Group Note (Signed)
 LCSW Group Therapy Note   Group Date: 05/15/2024 Start Time: 1330 End Time: 1400   Type of Therapy and Topic:  Group Therapy: Challenging Core Beliefs  Participation Level:  Active  Description of Group:  Patients were educated about core beliefs and asked to identify one harmful core belief that they have. Patients were asked to explore from where those beliefs originate. Patients were asked to discuss how those beliefs make them feel and the resulting behaviors of those beliefs. They were then be asked if those beliefs are true and, if so, what evidence they have to support them. Lastly, group members were challenged to replace those negative core beliefs with helpful beliefs.   Therapeutic Goals:   1. Patient will identify harmful core beliefs and explore the origins of such beliefs. 2. Patient will identify feelings and behaviors that result from those core beliefs. 3. Patient will discuss whether such beliefs are true. 4.  Patient will replace harmful core beliefs with helpful ones.  Summary of Patient Progress:  Benjamin [name] actively engaged in processing and exploring how core beliefs are formed and how they impact thoughts, feelings, and behaviors. Patient proved open to input from peers and feedback from CSW. Patient demonstrated fair insight into the subject matter, was respectful and supportive of peers, and participated throughout the entire session.  Therapeutic Modalities: Cognitive Behavioral Therapy; Solution-Focused Therapy   Benjamin [name], [place] 05/15/2024  2:30 PM

## 2024-05-15 NOTE — Progress Notes (Signed)
 Mood/Behavior:   Pleasant and cooperative.    Psych assessment:  Denies SI/HI and AVH.  No complaints.   Interaction / Group attendance:  Isolates to room with the exception of meals.  Minimal interaction with peers and staff.   Medication/ PRNs: Compliant with scheduled medications.  No PRNs needed.  Pain:  Denies  15 min checks in place for safety.

## 2024-05-15 NOTE — Plan of Care (Signed)
  Problem: Education: Goal: Emotional status will improve Outcome: Progressing   Problem: Activity: Goal: Sleeping patterns will improve Outcome: Progressing   Problem: Activity: Goal: Interest or engagement in activities will improve Outcome: Not Progressing

## 2024-05-15 NOTE — Group Note (Signed)
 Date:  05/15/2024 Time:  11:05 AM  Group Topic/Focus:  Managing Feelings:   The focus of this group is to identify what feelings patients have difficulty handling and develop a plan to handle them in a healthier way upon discharge.    Participation Level:  Active  Participation Quality:  Appropriate  Affect:  Appropriate  Cognitive:  Appropriate  Insight: Appropriate  Engagement in Group:  Engaged  Modes of Intervention:  Discussion   Arland Nutting 05/15/2024, 11:05 AM

## 2024-05-15 NOTE — BHH Counselor (Signed)
 CSW contacted [name], [phone], APS social worker, to check in regarding placement.   CSW unable to reach, left HIPAA compliant VM requesting return call.   Lum Croft, MSW, [place] 05/15/2024 10:09 AM

## 2024-05-15 NOTE — Progress Notes (Signed)
 [facility] MD Progress Note  05/15/2024 10:26 AM Benjamin [name]  MRN:  [id number] Benjamin [name] is a 78 year old male who presents to the inpatient geriatric psych unit after jumping from a two story building. Patient was originally seen at [facility] who referred him to the ED who admitted him here on 03/27/2024. Patient reports that people from the [employer] gang were trying to kill him and the only escape was through the bedroom window on the second floor. He currently lives in the house with [name] and his family and has been living with them for 3-4 years without any problems. On 03/19/2024 he went to the doctor and when he got back home the family wouldn't let him leave. He states that after he realized the gang was going to kill him he barricaded the door with his bed and jumped out the window. When he landed he ran away until one of his neighbors found him and called 911.   Subjective: Chart is reviewed and discussed with the treatment team.  Per nursing report patient is doing well with no behavioral problems on the unit.  Patient is taking his medications with no reported side effects.  Patient denies SI/HI/plan and denies hallucinations.  05/15/2024: Per nursing report and chart review, patient continues to do well and no behavioral problems noted. Patient continues to take medication as scheduled. When this interviewer walked into room, patient stated I know who you are with smile on face. Patient could tell me his name and birthday, but was unable to tell me what kind of building he was in, what brought him to the hospital, and what year it currently was. Patient reported the current year was 66. He denied SI/HI/AH/VH.      Able to understand medical problem Patient is unable to display any understanding of his medical problems or the current presentation leading up to admission Able to understand proposed treatment  Patient is unable to understand the proposed treatment  with antipsychotics Patient remains paranoid and has cognitive impairment as he is unable to discuss his discharge planning.  Given his paranoia and delay in executive function patient is unable to make decisions regarding the treatment and disposition.    Sleep: Fair  Appetite:  Fair  Past Psychiatric History: see h&P  Family History:  Family History  Problem Relation Age of Onset   Colon cancer Neg Hx    Colon polyps Neg Hx    Stomach cancer Neg Hx    Esophageal cancer Neg Hx    Social History:  Social History   Substance and Sexual Activity  Alcohol Use No     Social History   Substance and Sexual Activity  Drug Use No    Social History   Socioeconomic History   Marital status: Single    Spouse name: Not on file   Number of children: Not on file   Years of education: Not on file   Highest education level: Not on file  Occupational History   Not on file  Tobacco Use   Smoking status: Former    Types: Cigarettes   Smokeless tobacco: Never  Vaping Use   Vaping status: Never Used  Substance and Sexual Activity   Alcohol use: No   Drug use: No   Sexual activity: Not on file  Other Topics Concern   Not on file  Social History Narrative   Not on file   Social Drivers of Health   Financial Resource Strain: Not on file  Food Insecurity: No Food Insecurity (03/27/2024)   Hunger Vital Sign    Worried About Running Out of Food in the Last Year: Never true    Ran Out of Food in the Last Year: Never true  Transportation Needs: No Transportation Needs (03/27/2024)   PRAPARE - Administrator, Civil Service (Medical): No    Lack of Transportation (Non-Medical): No  Physical Activity: Not on file  Stress: Not on file  Social Connections: Moderately Integrated (03/27/2024)   Social Connection and Isolation Panel    Frequency of Communication with Friends and Family: Twice a week    Frequency of Social Gatherings with Friends and Family: Once a week     Attends Religious Services: 1 to 4 times per year    Active Member of [employer] or Organizations: No    Attends Engineer, structural: 1 to 4 times per year    Marital Status: Divorced   Past Medical History:  Past Medical History:  Diagnosis Date   Allergy    Arthritis    back   Prostate cancer (HCC) dx'd 2010   surg only    Past Surgical History:  Procedure Laterality Date   COLONOSCOPY     EYE SURGERY     PROSTATE SURGERY      Current Medications: Current Facility-Administered Medications  Medication Dose Route Frequency Provider Last Rate Last Admin   acetaminophen  (TYLENOL ) tablet 650 mg  650 mg Oral Q6H PRN Coleman, Carolyn H, NP       alum & mag hydroxide-simeth (MAALOX/MYLANTA) 200-200-20 MG/5ML suspension 30 mL  30 mL Oral Q4H PRN Coleman, Carolyn H, NP       docusate sodium  (COLACE) capsule 100 mg  100 mg Oral Daily Jadapalle, Sree, MD   100 mg at 05/15/24 9196   fluPHENAZine  (PROLIXIN ) tablet 10 mg  10 mg Oral BID Shrivastava, Aryendra, MD   10 mg at 05/15/24 9196   Or   fluPHENAZine  (PROLIXIN ) injection 5 mg  5 mg Intramuscular BID Shrivastava, Aryendra, MD       magnesium  hydroxide (MILK OF MAGNESIA) suspension 30 mL  30 mL Oral Daily PRN Mardy Elveria DEL, NP       OLANZapine  (ZYPREXA ) injection 5 mg  5 mg Intramuscular TID PRN Mardy Elveria DEL, NP   5 mg at 04/12/24 2230   OLANZapine  zydis (ZYPREXA ) disintegrating tablet 5 mg  5 mg Oral TID PRN Mardy Elveria DEL, NP       polyethylene glycol (MIRALAX  / GLYCOLAX ) packet 17 g  17 g Oral Daily PRN Donnelly Mellow, MD   17 g at 05/10/24 0830    Lab Results:  No results found for this or any previous visit (from the past 48 hours).   Blood Alcohol level:  Lab Results  Component Value Date   [name] <15 03/26/2024    Metabolic Disorder Labs: Lab Results  Component Value Date   HGBA1C 5.6 04/02/2024   MPG 114 04/02/2024   No results found for: PROLACTIN Lab Results  Component Value Date   CHOL 146  04/02/2024   TRIG 37 04/02/2024   HDL 43 04/02/2024   CHOLHDL 3.4 04/02/2024   VLDL 7 04/02/2024   LDLCALC 96 04/02/2024    Physical Findings: AIMS:  , ,  ,  ,    CIWA:    COWS:      Psychiatric Specialty Exam:  Presentation  General Appearance:  Appropriate for Environment; Bizarre  Eye Contact: Fair  Speech: Clear  and Coherent  Speech Volume: Normal    Mood and Affect  Mood: Anxious  Affect: Congruent   Thought Process  Thought Processes: Disorganized  Descriptions of Associations:Intact  Orientation:Full (Time, Place and Person)  Thought Content: Paranoia at baseline Hallucinations: Denies  Ideas of Reference: Paranoia at baseline Suicidal Thoughts: Denies  Homicidal Thoughts: Denies   Sensorium  Memory: Immediate Fair; Recent Fair; Remote Fair  Judgment: Impaired  Insight: Shallow   Executive Functions  Concentration: Fair  Attention Span: Fair  Recall: [employer] of Knowledge: Fair  Language: Fair   Psychomotor Activity  Psychomotor Activity: No data recorded  Musculoskeletal: Strength & Muscle Tone: within normal limits Gait & Station: normal Assets  Assets: Manufacturing systems engineer; Resilience    Physical Exam: Physical Exam Vitals and nursing note reviewed.    ROS Blood pressure 97/71, pulse 68, temperature 98.4 F (36.9 C), resp. rate 14, height 6' (1.829 m), weight 100 kg, SpO2 99%. Body mass index is 29.91 kg/m.  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  Patient needs to be monitored closely for ongoing psychosis and paranoid delusions.  Given patient's confusion and chronic paranoia, no family support, APS report has been made and it got screened in for further evaluation  by APS.   Differential diagnosis include: unspecified psychosis, delusion disorder, dementia, schizophrenia/schizoaffective disorder   Treatment Plan Summary: Requested the social work team to reach out to APS for safety concerns in the community as patient has chronic paranoia and confusion and is currently homeless patient remains confused and paranoid and spite of being compliant with medications.  Patient displays cognitive delay and deficits in executive function like planning, organizing and processing information.  At this point patient lacks capacity to make medical decisions.  We recommend APS referral for possible legal guardianship as he has no family or legal next of kin to help with decision-making.   Safety and Monitoring:             -- InVoluntary admission to inpatient psychiatric unit for safety, stabilization and treatment             -- Daily contact with patient to assess and evaluate symptoms and progress in treatment             -- Patient's case to be discussed in multi-disciplinary team meeting             -- Observation Level: q15 minute checks             -- Vital signs:  q12 hours             -- Precautions: suicide, elopement, and assault   2. Psychiatric Diagnoses and Treatment:              Continue Prolixin  to 10 mg twice daily. discontinued Zyprexa .  -- The risks/benefits/side-effects/alternatives to this medication were discussed in detail with the patient and time was given for questions. The patient consents to medication trial.                -- Metabolic profile and EKG monitoring obtained while on an atypical antipsychotic (BMI: Lipid Panel: HbgA1c: QTc:)              -- Encouraged patient to participate in unit  milieu and in scheduled group therapies                            3. Medical Issues Being Addressed:    No urgent medical needs noted  4. Discharge Planning:   -- Social work and case management to assist with discharge planning and  identification of hospital follow-up needs prior to discharge  -- Estimated LOS: 3-4 days  Zelda Sharps, NP

## 2024-05-15 NOTE — Group Note (Signed)
 Date:  05/15/2024 Time:  11:35 PM  Group Topic/Focus:  Wrap-Up Group:   The focus of this group is to help patients review their daily goal of treatment and discuss progress on daily workbooks.    Participation Level:  Active  Participation Quality:  Appropriate  Affect:  Appropriate  Cognitive:  Alert  Insight: Appropriate  Engagement in Group:  Engaged  Modes of Intervention:  Discussion  Additional Comments:    Benjamin [name] 05/15/2024, 11:35 PM

## 2024-05-15 NOTE — Group Note (Signed)
 Recreation Therapy Group Note   Group Topic:Leisure Education  Group Date: 05/15/2024 Start Time: 1400 End Time: 1500 Facilitators: Celestia Jeoffrey BRAVO, LRT, CTRS Location: Courtyard  Group Description: Music. Patients encouraged to name their favorite song(s) for LRT to play song through speaker for group to hear, while in the courtyard getting fresh air and sunlight. Patients educated on the definition of leisure and the importance of having different leisure interests outside of the hospital. Group discussed how leisure activities can often be used as Pharmacologist and that listening to music and being outside are examples.    Goal Area(s) Addressed:  Patient will identify a current leisure interest.  Patient will practice making a positive decision. Patient will have the opportunity to try a new leisure activity.   Affect/Mood: N/A   Participation Level: Did not attend    Clinical Observations/Individualized Feedback: Patient did not attend group.   Plan: Continue to engage patient in RT group sessions 2-3x/week.   Jeoffrey BRAVO Celestia, LRT, CTRS 05/15/2024 4:31 PM

## 2024-05-15 NOTE — Progress Notes (Signed)
(  Sleep Hours) - 10.5 (Any PRNs that were needed, meds refused, or side effects to meds)- n/a (Any disturbances and when (visitation, over night)- n/a  (Concerns raised by the patient)- n/a  (SI/HI/AVH)- denies

## 2024-05-16 DIAGNOSIS — F22 Delusional disorders: Secondary | ICD-10-CM | POA: Diagnosis not present

## 2024-05-16 NOTE — Progress Notes (Signed)
   05/16/24 1100  Psych Admission Type (Psych Patients Only)  Admission Status Involuntary  Psychosocial Assessment  Patient Complaints None  Eye Contact Fair  Facial Expression Flat  Affect Appropriate to circumstance  Speech Soft  Interaction Isolative  Motor Activity Slow  Appearance/Hygiene In scrubs  Behavior Characteristics Cooperative  Mood Pleasant  Thought Process  Coherency WDL  Content WDL  Delusions None reported or observed  Perception WDL  Hallucination None reported or observed  Judgment Impaired  Confusion Mild  Danger to Self  Current suicidal ideation? Denies  Agreement Not to Harm Self Yes  Description of Agreement verbal  Danger to Others  Danger to Others None reported or observed

## 2024-05-16 NOTE — BHH Counselor (Signed)
 CSW received return call from [name], APS Social Worker on 05/15/24, Arch reports she is still currently working to place pt, reports she will keep CSW updated.   Lum Croft, MSW, [place] 05/16/2024 11:29 AM

## 2024-05-16 NOTE — Group Note (Signed)
 Date:  05/16/2024 Time:  2:22 PM  Group Topic/Focus:  Crisis Planning:   The purpose of this group is to help patients create a crisis plan for use upon discharge or in the future, as needed.    Participation Level:  Did Not Attend   Benjamin [name] 05/16/2024, 2:22 PM

## 2024-05-16 NOTE — Plan of Care (Signed)
   Problem: Health Behavior/Discharge Planning: Goal: Ability to manage health-related needs will improve Outcome: Progressing   Problem: Clinical Measurements: Goal: Will remain free from infection Outcome: Progressing

## 2024-05-16 NOTE — Group Note (Signed)
 Date:  05/16/2024 Time:  2:19 PM  Group Topic/Focus:  Making Healthy Choices:   The focus of this group is to help patients identify negative/unhealthy choices they were using prior to admission and identify positive/healthier coping strategies to replace them upon discharge.    Participation Level:  Active  Participation Quality:  Appropriate  Affect:  Appropriate  Cognitive:  Alert and Appropriate  Insight: Appropriate  Engagement in Group:  Engaged  Modes of Intervention:  Discussion  Additional Comments:  N/A  [name] Gavel 05/16/2024, 2:19 PM

## 2024-05-16 NOTE — Progress Notes (Signed)
 [facility] MD Progress Note  05/16/2024 12:03 PM Benjamin [name]  MRN:  [id number] Benjamin [name] is a 78 year old male who presents to the inpatient geriatric psych unit after jumping from a two story building. Patient was originally seen at [facility] who referred him to the ED who admitted him here on 03/27/2024. Patient reports that people from the [place] gang were trying to kill him and the only escape was through the bedroom window on the second floor. He currently lives in the house with [name] and his family and has been living with them for 3-4 years without any problems. On 03/19/2024 he went to the doctor and when he got back home the family wouldn't let him leave. He states that after he realized the gang was going to kill him he barricaded the door with his bed and jumped out the window. When he landed he ran away until one of his neighbors found him and called 911.   Subjective: Chart is reviewed and discussed with the treatment team.  Per nursing report patient is calm and cooperative remains confused but is taking his medications with no reported side effects.  Patient is noted to be resting in bed.  Patient offers no complaints.  He denies suicidal/homicidal ideation/plan and denied auditory/visual hallucinations.  He remains confused about APS case worker who visited him recently.     Able to understand medical problem Patient is unable to display any understanding of his medical problems or the current presentation leading up to admission Able to understand proposed treatment  Patient is unable to understand the proposed treatment with antipsychotics Patient remains paranoid and has cognitive impairment as he is unable to discuss his discharge planning.  Given his paranoia and delay in executive function patient is unable to make decisions regarding the treatment and disposition.    Sleep: Fair  Appetite:  Fair  Past Psychiatric History: see h&P  Family History:   Family History  Problem Relation Age of Onset   Colon cancer Neg Hx    Colon polyps Neg Hx    Stomach cancer Neg Hx    Esophageal cancer Neg Hx    Social History:  Social History   Substance and Sexual Activity  Alcohol Use No     Social History   Substance and Sexual Activity  Drug Use No    Social History   Socioeconomic History   Marital status: Single    Spouse name: Not on file   Number of children: Not on file   Years of education: Not on file   Highest education level: Not on file  Occupational History   Not on file  Tobacco Use   Smoking status: Former    Types: Cigarettes   Smokeless tobacco: Never  Vaping Use   Vaping status: Never Used  Substance and Sexual Activity   Alcohol use: No   Drug use: No   Sexual activity: Not on file  Other Topics Concern   Not on file  Social History Narrative   Not on file   Social Drivers of Health   Financial Resource Strain: Not on file  Food Insecurity: No Food Insecurity (03/27/2024)   Hunger Vital Sign    Worried About Running Out of Food in the Last Year: Never true    Ran Out of Food in the Last Year: Never true  Transportation Needs: No Transportation Needs (03/27/2024)   PRAPARE - Administrator, Civil Service (Medical): No  Lack of Transportation (Non-Medical): No  Physical Activity: Not on file  Stress: Not on file  Social Connections: Moderately Integrated (03/27/2024)   Social Connection and Isolation Panel    Frequency of Communication with Friends and Family: Twice a week    Frequency of Social Gatherings with Friends and Family: Once a week    Attends Religious Services: 1 to 4 times per year    Active Member of [employer] or Organizations: No    Attends Engineer, structural: 1 to 4 times per year    Marital Status: Divorced   Past Medical History:  Past Medical History:  Diagnosis Date   Allergy    Arthritis    back   Prostate cancer (HCC) dx'd 2010   surg only    Past  Surgical History:  Procedure Laterality Date   COLONOSCOPY     EYE SURGERY     PROSTATE SURGERY      Current Medications: Current Facility-Administered Medications  Medication Dose Route Frequency Provider Last Rate Last Admin   acetaminophen  (TYLENOL ) tablet 650 mg  650 mg Oral Q6H PRN Coleman, Carolyn H, NP       alum & mag hydroxide-simeth (MAALOX/MYLANTA) 200-200-20 MG/5ML suspension 30 mL  30 mL Oral Q4H PRN Coleman, Carolyn H, NP       docusate sodium  (COLACE) capsule 100 mg  100 mg Oral Daily Buck Mcaffee, MD   100 mg at 05/16/24 1006   fluPHENAZine  (PROLIXIN ) tablet 10 mg  10 mg Oral BID Shrivastava, Aryendra, MD   10 mg at 05/16/24 1006   Or   fluPHENAZine  (PROLIXIN ) injection 5 mg  5 mg Intramuscular BID Shrivastava, Aryendra, MD       magnesium  hydroxide (MILK OF MAGNESIA) suspension 30 mL  30 mL Oral Daily PRN Mardy Elveria DEL, NP       OLANZapine  (ZYPREXA ) injection 5 mg  5 mg Intramuscular TID PRN Mardy Elveria DEL, NP   5 mg at 04/12/24 2230   OLANZapine  zydis (ZYPREXA ) disintegrating tablet 5 mg  5 mg Oral TID PRN Mardy Elveria DEL, NP       polyethylene glycol (MIRALAX  / GLYCOLAX ) packet 17 g  17 g Oral Daily PRN Donnelly Mellow, MD   17 g at 05/10/24 0830    Lab Results:  No results found for this or any previous visit (from the past 48 hours).   Blood Alcohol level:  Lab Results  Component Value Date   [facility] <15 03/26/2024    Metabolic Disorder Labs: Lab Results  Component Value Date   HGBA1C 5.6 04/02/2024   MPG 114 04/02/2024   No results found for: PROLACTIN Lab Results  Component Value Date   CHOL 146 04/02/2024   TRIG 37 04/02/2024   HDL 43 04/02/2024   CHOLHDL 3.4 04/02/2024   VLDL 7 04/02/2024   LDLCALC 96 04/02/2024    Physical Findings: AIMS:  , ,  ,  ,    CIWA:    COWS:      Psychiatric Specialty Exam:  Presentation  General Appearance:  Appropriate for Environment; Bizarre  Eye Contact: Fair  Speech: Clear and  Coherent  Speech Volume: Normal    Mood and Affect  Mood: Anxious  Affect: Congruent   Thought Process  Thought Processes: Disorganized  Descriptions of Associations:Intact  Orientation:Full (Time, Place and Person)  Thought Content: Paranoia at baseline Hallucinations: Denies  Ideas of Reference: Paranoia at baseline Suicidal Thoughts: Denies  Homicidal Thoughts: Denies   Sensorium  Memory: Immediate Fair; Recent Fair; Remote Fair  Judgment: Impaired  Insight: Shallow   Executive Functions  Concentration: Fair  Attention Span: Fair  Recall: [employer] of Knowledge: Fair  Language: Fair   Psychomotor Activity  Psychomotor Activity: No data recorded  Musculoskeletal: Strength & Muscle Tone: within normal limits Gait & Station: normal Assets  Assets: Manufacturing systems engineer; Resilience    Physical Exam: Physical Exam Vitals and nursing note reviewed.    ROS Blood pressure 101/80, pulse 71, temperature 97.8 F (36.6 C), resp. rate 18, height 6' (1.829 m), weight 100 kg, SpO2 99%. Body mass index is 29.91 kg/m.  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  Patient needs to be monitored closely for ongoing psychosis and paranoid delusions.  Given patient's confusion and chronic paranoia, no family support, APS report has been made and it got screened in for further evaluation by APS.   Differential diagnosis include: unspecified psychosis, delusion disorder, dementia, schizophrenia/schizoaffective disorder   Treatment Plan Summary: Requested the social work team to reach out to APS for safety concerns in the community as patient has chronic paranoia and confusion and is currently homeless patient remains  confused and paranoid and spite of being compliant with medications.  Patient displays cognitive delay and deficits in executive function like planning, organizing and processing information.  At this point patient lacks capacity to make medical decisions.  We recommend APS referral for possible legal guardianship as he has no family or legal next of kin to help with decision-making.   Safety and Monitoring:             -- InVoluntary admission to inpatient psychiatric unit for safety, stabilization and treatment             -- Daily contact with patient to assess and evaluate symptoms and progress in treatment             -- Patient's case to be discussed in multi-disciplinary team meeting             -- Observation Level: q15 minute checks             -- Vital signs:  q12 hours             -- Precautions: suicide, elopement, and assault   2. Psychiatric Diagnoses and Treatment:              Continue Prolixin  to 10 mg twice daily. discontinued Zyprexa .  -- The risks/benefits/side-effects/alternatives to this medication were discussed in detail with the patient and time was given for questions. The patient consents to medication trial.                -- Metabolic profile and EKG monitoring obtained while on an atypical antipsychotic (BMI: Lipid Panel: HbgA1c: QTc:)              -- Encouraged patient to participate in unit milieu and in scheduled group therapies                            3. Medical Issues Being Addressed:    No urgent medical needs noted  4. Discharge Planning:   -- Social work and case management to assist with discharge  planning and identification of hospital follow-up needs prior to discharge  -- Estimated LOS: 3-4 days  Airiel Oblinger, MD 05/16/2024, 12:03 PM

## 2024-05-16 NOTE — Progress Notes (Signed)
   05/15/24 2100  Psych Admission Type (Psych Patients Only)  Admission Status Involuntary  Psychosocial Assessment  Patient Complaints None  Eye Contact Fair  Facial Expression Flat  Affect Appropriate to circumstance  Speech Soft  Interaction Minimal  Motor Activity Slow  Appearance/Hygiene In scrubs  Behavior Characteristics Cooperative  Mood Pleasant  Thought Process  Coherency WDL  Content WDL  Delusions None reported or observed  Perception WDL  Hallucination None reported or observed  Judgment Impaired  Confusion Mild  Danger to Self  Current suicidal ideation? Denies

## 2024-05-17 DIAGNOSIS — F22 Delusional disorders: Secondary | ICD-10-CM | POA: Diagnosis not present

## 2024-05-17 NOTE — Group Note (Signed)
 LCSW Group Therapy Note  Group Date: 05/17/2024 Start Time: 1330 End Time: 1400   Type of Therapy and Topic:  Group Therapy - Healthy vs Unhealthy Coping Skills  Participation Level:  None   Description of Group The focus of this group was to determine what unhealthy coping techniques typically are used by group members and what healthy coping techniques would be helpful in coping with various problems. Patients were guided in becoming aware of the differences between healthy and unhealthy coping techniques. Patients were asked to identify 2-3 healthy coping skills they would like to learn to use more effectively.  Therapeutic Goals Patients learned that coping is what human beings do all day long to deal with various situations in their lives Patients defined and discussed healthy vs unhealthy coping techniques Patients identified their preferred coping techniques and identified whether these were healthy or unhealthy Patients determined 2-3 healthy coping skills they would like to become more familiar with and use more often. Patients provided support and ideas to each other   Summary of Patient Progress: X   Therapeutic Modalities Cognitive Behavioral Therapy Motivational Interviewing  Benjamin [name], [place] 05/17/2024  2:32 PM

## 2024-05-17 NOTE — Group Note (Signed)
 Date:  05/17/2024 Time:  8:45 PM  Group Topic/Focus:  Wrap-Up Group:   The focus of this group is to help patients review their daily goal of treatment and discuss progress on daily workbooks.    Participation Level:  Active  Participation Quality:  Appropriate  Affect:  Appropriate  Cognitive:  Appropriate  Insight: Appropriate  Engagement in Group:  Engaged  Modes of Intervention:  Discussion  Additional Comments:    Benjamin [name] 05/17/2024, 8:45 PM

## 2024-05-17 NOTE — Plan of Care (Signed)
   Problem: Education: Goal: Knowledge of Benjamin [name] General Education information/materials will improve Outcome: Progressing Goal: Emotional status will improve Outcome: Progressing Goal: Mental status will improve Outcome: Progressing Goal: Verbalization of understanding the information provided will improve Outcome: Progressing   Problem: Activity: Goal: Interest or engagement in activities will improve Outcome: Progressing Goal: Sleeping patterns will improve Outcome: Progressing   Problem: Coping: Goal: Ability to verbalize frustrations and anger appropriately will improve Outcome: Progressing Goal: Ability to demonstrate self-control will improve Outcome: Progressing

## 2024-05-17 NOTE — Group Note (Signed)
 Recreation Therapy Group Note   Group Topic:Communication  Group Date: 05/17/2024 Start Time: 1400 End Time: 1500 Facilitators: Celestia Jeoffrey BRAVO, LRT, CTRS Location: Courtyard  Group Description: Merchant navy officer. Patients drew a laminated card out of a bag that had a word or phrase on it. Pt encouraged to speak about a time in their life or fond memory that specifically relates to the word they chose out of the bag. An example would be: "parenthood, meals, siblings, travel, or home".  LRT prompted following questions and encouraged contribution from peers to increase communication.   Goal Area(s) Addressed: Patient will increase verbal communication by conversing with peers. Patient will contribute to group discussion with minimal prompting. Patient will reminisce a positive memory or moment in their life.    Affect/Mood: N/A   Participation Level: Did not attend    Clinical Observations/Individualized Feedback: Patient did not attend group.   Plan: Continue to engage patient in RT group sessions 2-3x/week.   Jeoffrey BRAVO Celestia, LRT, CTRS 05/17/2024 4:57 PM

## 2024-05-17 NOTE — Progress Notes (Signed)
   05/17/24 0745  Psych Admission Type (Psych Patients Only)  Admission Status Involuntary  Psychosocial Assessment  Patient Complaints None  Eye Contact Fair  Facial Expression Flat  Affect Appropriate to circumstance  Speech Soft  Interaction Minimal  Motor Activity Slow  Appearance/Hygiene In scrubs  Behavior Characteristics Cooperative  Mood Pleasant  Thought Process  Coherency WDL  Content WDL  Delusions None reported or observed  Perception WDL  Hallucination None reported or observed  Judgment Impaired  Confusion Mild  Danger to Self  Current suicidal ideation? Denies

## 2024-05-17 NOTE — Progress Notes (Signed)
   05/17/24 2117  Psych Admission Type (Psych Patients Only)  Admission Status Involuntary  Psychosocial Assessment  Patient Complaints None  Eye Contact Fair  Facial Expression Flat  Affect Appropriate to circumstance  Speech Soft  Interaction Minimal  Motor Activity Slow  Appearance/Hygiene In scrubs;Unremarkable  Behavior Characteristics Cooperative;Appropriate to situation  Mood Pleasant  Thought Process  Coherency WDL  Content WDL  Delusions None reported or observed  Perception WDL  Hallucination None reported or observed  Judgment Impaired  Confusion Mild  Danger to Self  Current suicidal ideation? Denies  Agreement Not to Harm Self Yes  Description of Agreement verbal  Danger to Others  Danger to Others None reported or observed

## 2024-05-17 NOTE — Progress Notes (Signed)
 [name] MD Progress Note  05/17/2024 1:54 PM Benjamin [name]  MRN:  [id number] Benjamin [name] is a 78 year old male who presents to the inpatient geriatric psych unit after jumping from a two story building. Patient was originally seen at [facility] who referred him to the ED who admitted him here on 03/27/2024. Patient reports that people from the [place] gang were trying to kill him and the only escape was through the bedroom window on the second floor. He currently lives in the house with [name] and his family and has been living with them for 3-4 years without any problems. On 03/19/2024 he went to the doctor and when he got back home the family wouldn't let him leave. He states that after he realized the gang was going to kill him he barricaded the door with his bed and jumped out the window. When he landed he ran away until one of his neighbors found him and called 911.   Subjective: Chart is reviewed and discussed with the treatment team.  Patient is noted to be doing well and resting in bed.  He remains confused and his conversation is minimal related to the questions.  He denies feeling depressed or anxious.  He denies SI/HI/plan.  She denies auditory/visual hallucinations.  She has fair appetite and sleep.   Capacity :Able to understand medical problem Patient is unable to display any understanding of his medical problems or the current presentation leading up to admission Able to understand proposed treatment  Patient is unable to understand the proposed treatment with antipsychotics Patient remains paranoid and has cognitive impairment as he is unable to discuss his discharge planning.  Given his paranoia and delay in executive function patient is unable to make decisions regarding the treatment and disposition.    Sleep: Fair  Appetite:  Fair  Past Psychiatric History: see h&P  Family History:  Family History  Problem Relation Age of Onset   Colon cancer Neg Hx     Colon polyps Neg Hx    Stomach cancer Neg Hx    Esophageal cancer Neg Hx    Social History:  Social History   Substance and Sexual Activity  Alcohol Use No     Social History   Substance and Sexual Activity  Drug Use No    Social History   Socioeconomic History   Marital status: Single    Spouse name: Not on file   Number of children: Not on file   Years of education: Not on file   Highest education level: Not on file  Occupational History   Not on file  Tobacco Use   Smoking status: Former    Types: Cigarettes   Smokeless tobacco: Never  Vaping Use   Vaping status: Never Used  Substance and Sexual Activity   Alcohol use: No   Drug use: No   Sexual activity: Not on file  Other Topics Concern   Not on file  Social History Narrative   Not on file   Social Drivers of Health   Financial Resource Strain: Not on file  Food Insecurity: No Food Insecurity (03/27/2024)   Hunger Vital Sign    Worried About Running Out of Food in the Last Year: Never true    Ran Out of Food in the Last Year: Never true  Transportation Needs: No Transportation Needs (03/27/2024)   PRAPARE - Administrator, Civil Service (Medical): No    Lack of Transportation (Non-Medical): No  Physical Activity: Not  on file  Stress: Not on file  Social Connections: Moderately Integrated (03/27/2024)   Social Connection and Isolation Panel    Frequency of Communication with Friends and Family: Twice a week    Frequency of Social Gatherings with Friends and Family: Once a week    Attends Religious Services: 1 to 4 times per year    Active Member of [employer] or Organizations: No    Attends Engineer, structural: 1 to 4 times per year    Marital Status: Divorced   Past Medical History:  Past Medical History:  Diagnosis Date   Allergy    Arthritis    back   Prostate cancer (HCC) dx'd 2010   surg only    Past Surgical History:  Procedure Laterality Date   COLONOSCOPY     EYE SURGERY      PROSTATE SURGERY      Current Medications: Current Facility-Administered Medications  Medication Dose Route Frequency Provider Last Rate Last Admin   acetaminophen  (TYLENOL ) tablet 650 mg  650 mg Oral Q6H PRN Coleman, Carolyn H, NP       alum & mag hydroxide-simeth (MAALOX/MYLANTA) 200-200-20 MG/5ML suspension 30 mL  30 mL Oral Q4H PRN Coleman, Carolyn H, NP       docusate sodium  (COLACE) capsule 100 mg  100 mg Oral Daily Johnisha Louks, MD   100 mg at 05/17/24 0827   fluPHENAZine  (PROLIXIN ) tablet 10 mg  10 mg Oral BID Shrivastava, Aryendra, MD   10 mg at 05/17/24 0827   Or   fluPHENAZine  (PROLIXIN ) injection 5 mg  5 mg Intramuscular BID Shrivastava, Aryendra, MD       magnesium  hydroxide (MILK OF MAGNESIA) suspension 30 mL  30 mL Oral Daily PRN Mardy Elveria DEL, NP       OLANZapine  (ZYPREXA ) injection 5 mg  5 mg Intramuscular TID PRN Mardy Elveria DEL, NP   5 mg at 04/12/24 2230   OLANZapine  zydis (ZYPREXA ) disintegrating tablet 5 mg  5 mg Oral TID PRN Mardy Elveria DEL, NP       polyethylene glycol (MIRALAX  / GLYCOLAX ) packet 17 g  17 g Oral Daily PRN Donnelly Mellow, MD   17 g at 05/10/24 0830    Lab Results:  No results found for this or any previous visit (from the past 48 hours).   Blood Alcohol level:  Lab Results  Component Value Date   [name] <15 03/26/2024    Metabolic Disorder Labs: Lab Results  Component Value Date   HGBA1C 5.6 04/02/2024   MPG 114 04/02/2024   No results found for: PROLACTIN Lab Results  Component Value Date   CHOL 146 04/02/2024   TRIG 37 04/02/2024   HDL 43 04/02/2024   CHOLHDL 3.4 04/02/2024   VLDL 7 04/02/2024   LDLCALC 96 04/02/2024    Physical Findings: AIMS:  , ,  ,  ,    CIWA:    COWS:      Psychiatric Specialty Exam:  Presentation  General Appearance:  Appropriate for Environment; Bizarre  Eye Contact: Fair  Speech: Clear and Coherent  Speech Volume: Normal    Mood and Affect   Mood: Anxious  Affect: Congruent   Thought Process  Thought Processes: Disorganized  Descriptions of Associations:Intact  Orientation:Full (Time, Place and Person)  Thought Content: Paranoia at baseline Hallucinations: Denies  Ideas of Reference: Paranoia at baseline Suicidal Thoughts: Denies  Homicidal Thoughts: Denies   Sensorium  Memory: Immediate Fair; Recent Fair; Remote Fair  Judgment:  Impaired  Insight: Shallow   Executive Functions  Concentration: Fair  Attention Span: Fair  Recall: [employer] of Knowledge: Fair  Language: Fair   Psychomotor Activity  Psychomotor Activity: No data recorded  Musculoskeletal: Strength & Muscle Tone: within normal limits Gait & Station: normal Assets  Assets: Manufacturing systems engineer; Resilience    Physical Exam: Physical Exam Vitals and nursing note reviewed.    ROS Blood pressure 102/77, pulse 66, temperature 98.2 F (36.8 C), resp. rate 18, height 6' (1.829 m), weight 100 kg, SpO2 99%. Body mass index is 29.91 kg/m.  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  Patient needs to be monitored closely for ongoing psychosis and paranoid delusions.  Given patient's confusion and chronic paranoia, no family support, APS report has been made and it got screened in for further evaluation by APS.   Differential diagnosis include: unspecified psychosis, delusion disorder, dementia, schizophrenia/schizoaffective disorder   Treatment Plan Summary: Requested the social work team to reach out to APS for safety concerns in the community as patient has chronic paranoia and confusion and is currently homeless patient remains confused and paranoid and spite of  being compliant with medications.  Patient displays cognitive delay and deficits in executive function like planning, organizing and processing information.  At this point patient lacks capacity to make medical decisions.  We recommend APS referral for possible legal guardianship as he has no family or legal next of kin to help with decision-making.   Safety and Monitoring:             -- InVoluntary admission to inpatient psychiatric unit for safety, stabilization and treatment             -- Daily contact with patient to assess and evaluate symptoms and progress in treatment             -- Patient's case to be discussed in multi-disciplinary team meeting             -- Observation Level: q15 minute checks             -- Vital signs:  q12 hours             -- Precautions: suicide, elopement, and assault   2. Psychiatric Diagnoses and Treatment:              Continue Prolixin  to 10 mg twice daily. discontinued Zyprexa .  -- The risks/benefits/side-effects/alternatives to this medication were discussed in detail with the patient and time was given for questions. The patient consents to medication trial.                -- Metabolic profile and EKG monitoring obtained while on an atypical antipsychotic (BMI: Lipid Panel: HbgA1c: QTc:)              -- Encouraged patient to participate in unit milieu and in scheduled group therapies                            3. Medical Issues Being Addressed:    No urgent medical needs noted  4. Discharge Planning:   -- Social work and case management to assist with discharge planning and identification of hospital follow-up needs  prior to discharge  -- Estimated LOS: 3-4 days  Zachary Nole, MD 05/17/2024, 1:54 PM

## 2024-05-17 NOTE — Progress Notes (Signed)
   05/16/24 2100  Psych Admission Type (Psych Patients Only)  Admission Status Involuntary  Psychosocial Assessment  Patient Complaints None  Eye Contact Fair  Facial Expression Flat  Affect Appropriate to circumstance  Speech Soft  Interaction Minimal  Motor Activity Slow  Appearance/Hygiene In scrubs  Behavior Characteristics Cooperative  Mood Pleasant  Thought Process  Coherency WDL  Content WDL  Delusions None reported or observed  Perception WDL  Hallucination None reported or observed  Judgment Impaired  Confusion Mild  Danger to Self  Current suicidal ideation? Denies

## 2024-05-18 DIAGNOSIS — F22 Delusional disorders: Secondary | ICD-10-CM | POA: Diagnosis not present

## 2024-05-18 LAB — COMPREHENSIVE METABOLIC PANEL WITH GFR
ALT: 17 U/L (ref 0–44)
AST: 22 U/L (ref 15–41)
Albumin: 3.1 g/dL — ABNORMAL LOW (ref 3.5–5.0)
Alkaline Phosphatase: 47 U/L (ref 38–126)
Anion gap: 9 (ref 5–15)
BUN: 16 mg/dL (ref 8–23)
CO2: 28 mmol/L (ref 22–32)
Calcium: 9 mg/dL (ref 8.9–10.3)
Chloride: 101 mmol/L (ref 98–111)
Creatinine, Ser: 0.84 mg/dL (ref 0.61–1.24)
GFR, Estimated: >60 mL/min (ref >60)
Glucose, Bld: 96 mg/dL (ref 70–99)
Potassium: 4.1 mmol/L (ref 3.5–5.1)
Sodium: 138 mmol/L (ref 135–145)
Total Bilirubin: 0.6 mg/dL (ref 0.0–1.2)
Total Protein: 6.5 g/dL (ref 6.5–8.1)

## 2024-05-18 LAB — CBC WITH DIFFERENTIAL/PLATELET
Abs Immature Granulocytes: 0.01 K/uL (ref 0.00–0.07)
Basophils Absolute: 0 K/uL (ref 0.0–0.1)
Basophils Relative: 1 %
Eosinophils Absolute: 0.5 K/uL (ref 0.0–0.5)
Eosinophils Relative: 10 %
HCT: 38.8 % — ABNORMAL LOW (ref 39.0–52.0)
Hemoglobin: 12.4 g/dL — ABNORMAL LOW (ref 13.0–17.0)
Immature Granulocytes: 0 %
Lymphocytes Relative: 30 %
Lymphs Abs: 1.6 K/uL (ref 0.7–4.0)
MCH: 29.1 pg (ref 26.0–34.0)
MCHC: 32 g/dL (ref 30.0–36.0)
MCV: 91.1 fL (ref 80.0–100.0)
Monocytes Absolute: 0.5 K/uL (ref 0.1–1.0)
Monocytes Relative: 10 %
Neutro Abs: 2.6 K/uL (ref 1.7–7.7)
Neutrophils Relative %: 49 %
Platelets: 228 K/uL (ref 150–400)
RBC: 4.26 MIL/uL (ref 4.22–5.81)
RDW: 12.6 % (ref 11.5–15.5)
WBC: 5.3 K/uL (ref 4.0–10.5)
nRBC: 0 % (ref 0.0–0.2)

## 2024-05-18 NOTE — Progress Notes (Signed)
   05/18/24 2100  Psych Admission Type (Psych Patients Only)  Admission Status Involuntary  Psychosocial Assessment  Patient Complaints None  Eye Contact Fair  Facial Expression Flat  Affect Appropriate to circumstance  Speech Soft  Interaction Minimal  Motor Activity Slow  Appearance/Hygiene In scrubs  Behavior Characteristics Cooperative;Appropriate to situation  Mood Pleasant  Thought Process  Coherency WDL  Content WDL  Delusions None reported or observed  Perception WDL  Hallucination None reported or observed  Judgment Impaired  Confusion Mild  Danger to Self  Current suicidal ideation? Denies  Danger to Others  Danger to Others None reported or observed

## 2024-05-18 NOTE — Group Note (Signed)
 Physical/Occupational Therapy Group Note  Group Topic: Pain Management and Coping   Group Date: 05/18/2024 Start Time: 1310 End Time: 1405 Facilitators: Clive Warren CROME, OT   Group Description:  Group discussed impact of chronic/acute pain on safety and independence with functional tasks and impact on mental health.  Identified and discussed any previously learned or implemented strategies used.  Discussed and reviewed cognitive behavioral pain coping strategies to address/improve overall management of pain. Discussed relaxation, distraction techniques, cognitive restructuring, activity pacing/energy conservation, environment/home safety modifications, and role of sleep and sleep hygiene. Allowed time for questions and further discussion.      Therapeutic Goal(s):   Identify and discuss previously utilized pain coping strategies and implications of pain on function/well-being Identify and discuss implementing new cognitive behavioral pain coping strategies into daily routines Demonstrate understanding and performance of learned cognitive behavioral pain coping strategies.    Individual Participation: Pt present for session, fairly sedated. Engaged in group discussion with prompting but input was often loosely associated or demonstrating questionable attention or comprehension of education. Pt stated,I feel like I should be somewhere but I'm not.  Participation Level: Minimal   Participation Quality: Moderate Cues   Behavior: Calm, Lethargic, Passive, and Reserved   Speech/Thought Process: Distracted and Loose association    Affect/Mood: Blunted and Flat   Insight: Poor   Judgement: Poor   Modes of Intervention: Activity, Clarification, Discussion, Education, Exploration, Problem-solving, Rapport Building, Socialization, and Support  Patient Response to Interventions:  Disengaged and Receptive   Plan: Continue to engage patient in PT/OT groups 1 - 2x/week.  Darious Rehman R., MPH, MS,  OTR/L ascom [phone] 05/18/24, 3:47 PM

## 2024-05-18 NOTE — Plan of Care (Signed)
  Problem: Education: Goal: Knowledge of General Education information will improve Description: Including pain rating scale, medication(s)/side effects and non-pharmacologic comfort measures Outcome: Progressing   Problem: Health Behavior/Discharge Planning: Goal: Ability to manage health-related needs will improve Outcome: Progressing   Problem: Clinical Measurements: Goal: Ability to maintain clinical measurements within normal limits will improve Outcome: Progressing Goal: Will remain free from infection Outcome: Progressing Goal: Diagnostic test results will improve Outcome: Progressing Goal: Respiratory complications will improve Outcome: Progressing Goal: Cardiovascular complication will be avoided Outcome: Progressing   Problem: Activity: Goal: Risk for activity intolerance will decrease Outcome: Progressing   Problem: Nutrition: Goal: Adequate nutrition will be maintained Outcome: Progressing   Problem: Coping: Goal: Level of anxiety will decrease Outcome: Progressing   Problem: Elimination: Goal: Will not experience complications related to bowel motility Outcome: Progressing Goal: Will not experience complications related to urinary retention Outcome: Progressing   Problem: Pain Managment: Goal: General experience of comfort will improve and/or be controlled Outcome: Progressing   Problem: Safety: Goal: Ability to remain free from injury will improve Outcome: Progressing   Problem: Skin Integrity: Goal: Risk for impaired skin integrity will decrease Outcome: Progressing   Problem: Education: Goal: Knowledge of [place] [facility] Education information/materials will improve Outcome: Progressing Goal: Emotional status will improve Outcome: Progressing Goal: Mental status will improve Outcome: Progressing Goal: Verbalization of understanding the information provided will improve Outcome: Progressing   Problem: Activity: Goal: Interest or  engagement in activities will improve Outcome: Progressing Goal: Sleeping patterns will improve Outcome: Progressing   Problem: Coping: Goal: Ability to verbalize frustrations and anger appropriately will improve Outcome: Progressing Goal: Ability to demonstrate self-control will improve Outcome: Progressing   Problem: Health Behavior/Discharge Planning: Goal: Identification of resources available to assist in meeting health care needs will improve Outcome: Progressing Goal: Compliance with treatment plan for underlying cause of condition will improve Outcome: Progressing   Problem: Physical Regulation: Goal: Ability to maintain clinical measurements within normal limits will improve Outcome: Progressing   Problem: Safety: Goal: Periods of time without injury will increase Outcome: Progressing

## 2024-05-18 NOTE — Plan of Care (Signed)
   Problem: Coping: Goal: Level of anxiety will decrease Outcome: Progressing

## 2024-05-18 NOTE — Group Note (Signed)
 Recreation Therapy Group Note   Group Topic:Stress Management  Group Date: 05/18/2024 Start Time: 1400 End Time: 1500 Facilitators: Celestia Jeoffrey BRAVO, LRT, CTRS Location: Courtyard  Group Description: Meditation. LRT and patients discussed what they know about meditation and mindfulness. LRT played a Deep Breathing Meditation exercise script for patients to follow along to. LRT and patients discussed how meditation and deep breathing can be used as a coping skill post--discharge to help manage symptoms of stress. With remaining time in group, LRT played music through a speaker at the patients request of songs.   Goal Area(s) Addressed: Patient will practice using relaxation technique. Patient will identify a new coping skill.  Patient will follow multistep directions to reduce anxiety and stress.   Affect/Mood: N/A   Participation Level: Did not attend    Clinical Observations/Individualized Feedback: Patient did not attend group.   Plan: Continue to engage patient in RT group sessions 2-3x/week.   Jeoffrey BRAVO Celestia, LRT, CTRS 05/18/2024 3:40 PM

## 2024-05-18 NOTE — Progress Notes (Signed)
 [facility] MD Progress Note  05/18/2024 6:55 PM Benjamin [name]  MRN:  [id number] Benjamin [name] is a 78 year old male who presents to the inpatient geriatric psych unit after jumping from a two story building. Patient was originally seen at [name] who referred him to the ED who admitted him here on 03/27/2024. Patient reports that people from the [place] gang were trying to kill him and the only escape was through the bedroom window on the second floor. He currently lives in the house with [name] and his family and has been living with them for 3-4 years without any problems. On 03/19/2024 he went to the doctor and when he got back home the family wouldn't let him leave. He states that after he realized the gang was going to kill him he barricaded the door with his bed and jumped out the window. When he landed he ran away until one of his neighbors found him and called 911.   Subjective: Chart is reviewed and discussed with the treatment team.  Patient is noted to be resting in bed.  He was able to come out of his room and have his breakfast.  Per nursing staff patient is coming out to have his meals and participating in some outdoor recreational groups.  Patient remains confused and his verbal interactions have reduced.  Patient displays word-finding difficulty at times during the conversations.  He denies SI/HI/plan and denies hallucinations.    Capacity :Able to understand medical problem Patient is unable to display any understanding of his medical problems or the current presentation leading up to admission Able to understand proposed treatment  Patient is unable to understand the proposed treatment with antipsychotics Patient remains paranoid and has cognitive impairment as he is unable to discuss his discharge planning.  Given his paranoia and delay in executive function patient is unable to make decisions regarding the treatment and disposition.    Sleep: Fair  Appetite:   Fair  Past Psychiatric History: see h&P  Family History:  Family History  Problem Relation Age of Onset   Colon cancer Neg Hx    Colon polyps Neg Hx    Stomach cancer Neg Hx    Esophageal cancer Neg Hx    Social History:  Social History   Substance and Sexual Activity  Alcohol Use No     Social History   Substance and Sexual Activity  Drug Use No    Social History   Socioeconomic History   Marital status: Single    Spouse name: Not on file   Number of children: Not on file   Years of education: Not on file   Highest education level: Not on file  Occupational History   Not on file  Tobacco Use   Smoking status: Former    Types: Cigarettes   Smokeless tobacco: Never  Vaping Use   Vaping status: Never Used  Substance and Sexual Activity   Alcohol use: No   Drug use: No   Sexual activity: Not on file  Other Topics Concern   Not on file  Social History Narrative   Not on file   Social Drivers of Health   Financial Resource Strain: Not on file  Food Insecurity: No Food Insecurity (03/27/2024)   Hunger Vital Sign    Worried About Running Out of Food in the Last Year: Never true    Ran Out of Food in the Last Year: Never true  Transportation Needs: No Transportation Needs (03/27/2024)   PRAPARE -  Administrator, Civil Service (Medical): No    Lack of Transportation (Non-Medical): No  Physical Activity: Not on file  Stress: Not on file  Social Connections: Moderately Integrated (03/27/2024)   Social Connection and Isolation Panel    Frequency of Communication with Friends and Family: Twice a week    Frequency of Social Gatherings with Friends and Family: Once a week    Attends Religious Services: 1 to 4 times per year    Active Member of [employer] or Organizations: No    Attends Engineer, structural: 1 to 4 times per year    Marital Status: Divorced   Past Medical History:  Past Medical History:  Diagnosis Date   Allergy    Arthritis    back    Prostate cancer (HCC) dx'd 2010   surg only    Past Surgical History:  Procedure Laterality Date   COLONOSCOPY     EYE SURGERY     PROSTATE SURGERY      Current Medications: Current Facility-Administered Medications  Medication Dose Route Frequency Provider Last Rate Last Admin   acetaminophen  (TYLENOL ) tablet 650 mg  650 mg Oral Q6H PRN Coleman, Carolyn H, NP       alum & mag hydroxide-simeth (MAALOX/MYLANTA) 200-200-20 MG/5ML suspension 30 mL  30 mL Oral Q4H PRN Coleman, Carolyn H, NP       docusate sodium  (COLACE) capsule 100 mg  100 mg Oral Daily Shadia Larose, MD   100 mg at 05/18/24 9080   fluPHENAZine  (PROLIXIN ) tablet 10 mg  10 mg Oral BID Shrivastava, Aryendra, MD   10 mg at 05/18/24 9080   Or   fluPHENAZine  (PROLIXIN ) injection 5 mg  5 mg Intramuscular BID Shrivastava, Aryendra, MD       magnesium  hydroxide (MILK OF MAGNESIA) suspension 30 mL  30 mL Oral Daily PRN Mardy Elveria DEL, NP       OLANZapine  (ZYPREXA ) injection 5 mg  5 mg Intramuscular TID PRN Mardy Elveria DEL, NP   5 mg at 04/12/24 2230   OLANZapine  zydis (ZYPREXA ) disintegrating tablet 5 mg  5 mg Oral TID PRN Mardy Elveria DEL, NP       polyethylene glycol (MIRALAX  / GLYCOLAX ) packet 17 g  17 g Oral Daily PRN Donnelly Mellow, MD   17 g at 05/10/24 0830    Lab Results:  Results for orders placed or performed during the hospital encounter of 03/27/24 (from the past 48 hours)  Comprehensive metabolic panel     Status: Abnormal   Collection Time: 05/18/24  7:06 AM  Result Value Ref Range   Sodium 138 135 - 145 mmol/L   Potassium 4.1 3.5 - 5.1 mmol/L   Chloride 101 98 - 111 mmol/L   CO2 28 22 - 32 mmol/L   Glucose, Bld 96 70 - 99 mg/dL    Comment: Glucose reference range applies only to samples taken after fasting for at least 8 hours.   BUN 16 8 - 23 mg/dL   Creatinine, Ser 9.15 0.61 - 1.24 mg/dL   Calcium 9.0 8.9 - 89.6 mg/dL   Total Protein 6.5 6.5 - 8.1 g/dL   Albumin 3.1 (L) 3.5 - 5.0 g/dL    AST 22 15 - 41 U/L   ALT 17 0 - 44 U/L   Alkaline Phosphatase 47 38 - 126 U/L   Total Bilirubin 0.6 0.0 - 1.2 mg/dL   GFR, Estimated >39 >39 mL/min    Comment: (NOTE) Calculated using the  CKD-EPI Creatinine Equation (2021)    Anion gap 9 5 - 15    Comment: Performed at [name], [address] [place], [place] [address]  CBC with Differential/Platelet     Status: Abnormal   Collection Time: 05/18/24  7:06 AM  Result Value Ref Range   WBC 5.3 4.0 - 10.5 K/uL   RBC 4.26 4.22 - 5.81 MIL/uL   Hemoglobin 12.4 (L) 13.0 - 17.0 g/dL   HCT 61.1 (L) 60.9 - 47.9 %   MCV 91.1 80.0 - 100.0 fL   MCH 29.1 26.0 - 34.0 pg   MCHC 32.0 30.0 - 36.0 g/dL   RDW 87.3 88.4 - 84.4 %   Platelets 228 150 - 400 K/uL   nRBC 0.0 0.0 - 0.2 %   Neutrophils Relative % 49 %   Neutro Abs 2.6 1.7 - 7.7 K/uL   Lymphocytes Relative 30 %   Lymphs Abs 1.6 0.7 - 4.0 K/uL   Monocytes Relative 10 %   Monocytes Absolute 0.5 0.1 - 1.0 K/uL   Eosinophils Relative 10 %   Eosinophils Absolute 0.5 0.0 - 0.5 K/uL   Basophils Relative 1 %   Basophils Absolute 0.0 0.0 - 0.1 K/uL   Immature Granulocytes 0 %   Abs Immature Granulocytes 0.01 0.00 - 0.07 K/uL    Comment: Performed at [facility], [address] [place], [place] [address]     Blood Alcohol level:  Lab Results  Component Value Date   [facility] <15 03/26/2024    Metabolic Disorder Labs: Lab Results  Component Value Date   HGBA1C 5.6 04/02/2024   MPG 114 04/02/2024   No results found for: PROLACTIN Lab Results  Component Value Date   CHOL 146 04/02/2024   TRIG 37 04/02/2024   HDL 43 04/02/2024   CHOLHDL 3.4 04/02/2024   VLDL 7 04/02/2024   LDLCALC 96 04/02/2024    Physical Findings: AIMS:  , ,  ,  ,    CIWA:    COWS:      Psychiatric Specialty Exam:  Presentation  General Appearance:  Appropriate for Environment; Bizarre  Eye Contact: Fair  Speech: Clear and Coherent  Speech Volume: Normal    Mood and  Affect  Mood: Anxious  Affect: Congruent   Thought Process  Thought Processes: Disorganized  Descriptions of Associations:Intact  Orientation:Full (Time, Place and Person)  Thought Content: Paranoia at baseline Hallucinations: Denies  Ideas of Reference: Paranoia at baseline Suicidal Thoughts: Denies  Homicidal Thoughts: Denies   Sensorium  Memory: Immediate Fair; Recent Fair; Remote Fair  Judgment: Impaired  Insight: Shallow   Executive Functions  Concentration: Fair  Attention Span: Fair  Recall: [employer] of Knowledge: Fair  Language: Fair   Psychomotor Activity  Psychomotor Activity: No data recorded  Musculoskeletal: Strength & Muscle Tone: within normal limits Gait & Station: normal Assets  Assets: Manufacturing systems engineer; Resilience    Physical Exam: Physical Exam Vitals and nursing note reviewed.    ROS Blood pressure 95/66, pulse 66, temperature 98.2 F (36.8 C), resp. rate 19, height 6' (1.829 m), weight 100 kg, SpO2 99%. Body mass index is 29.91 kg/m.  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  Patient needs to be monitored closely for  ongoing psychosis and paranoid delusions.  Given patient's confusion and chronic paranoia, no family support, APS report has been made and it got screened in for further evaluation by APS.   Differential diagnosis include: unspecified psychosis, delusion disorder, dementia, schizophrenia/schizoaffective disorder   Treatment Plan Summary: Requested the social work team to reach out to APS for safety concerns in the community as patient has chronic paranoia and confusion and is currently homeless patient remains confused and paranoid and  spite of being compliant with medications.  Patient displays cognitive delay and deficits in executive function like planning, organizing and processing information.  At this point patient lacks capacity to make medical decisions.  We recommend APS referral for possible legal guardianship as he has no family or legal next of kin to help with decision-making.   Safety and Monitoring:             -- InVoluntary admission to inpatient psychiatric unit for safety, stabilization and treatment             -- Daily contact with patient to assess and evaluate symptoms and progress in treatment             -- Patient's case to be discussed in multi-disciplinary team meeting             -- Observation Level: q15 minute checks             -- Vital signs:  q12 hours             -- Precautions: suicide, elopement, and assault   2. Psychiatric Diagnoses and Treatment:              Continue Prolixin  to 10 mg twice daily. discontinued Zyprexa .  -- The risks/benefits/side-effects/alternatives to this medication were discussed in detail with the patient and time was given for questions. The patient consents to medication trial.                -- Metabolic profile and EKG monitoring obtained while on an atypical antipsychotic (BMI: Lipid Panel: HbgA1c: QTc:)              -- Encouraged patient to participate in unit milieu and in scheduled group therapies                            3. Medical Issues Being Addressed:    No urgent medical needs noted  4. Discharge Planning:   -- Social work and case management to assist with discharge planning and identification of hospital follow-up needs prior to discharge  -- Estimated LOS: 3-4 days  Aryonna Gunnerson, MD 05/18/2024, 6:55 PM

## 2024-05-19 NOTE — Progress Notes (Signed)
 Mood/Behavior:  Pleasant and cooperative. Mild confusion.  No behavioral issues.   Psych assessment:  Denies SI/HI.   Interaction / Group attendance:  Present in the milieu.  Minimal interaction with peers and staff.  Attended groups.  Medication/ PRNs: Compliant with scheduled medications. No PRNs needed.  Pain: Denies  15 min checks in place for safety.

## 2024-05-19 NOTE — BH IP Treatment Plan (Signed)
 Interdisciplinary Treatment and Diagnostic Plan Update  05/19/2024 Time of Session: 3:40pm Benjamin [name] MRN: [id number]  Principal Diagnosis: Delusional disorder [facility])  Secondary Diagnoses: Principal Problem:   Delusional disorder (HCC)   Current Medications:  Current Facility-Administered Medications  Medication Dose Route Frequency Provider Last Rate Last Admin   acetaminophen  (TYLENOL ) tablet 650 mg  650 mg Oral Q6H PRN Coleman, Carolyn H, NP       alum & mag hydroxide-simeth (MAALOX/MYLANTA) 200-200-20 MG/5ML suspension 30 mL  30 mL Oral Q4H PRN Coleman, Carolyn H, NP       docusate sodium  (COLACE) capsule 100 mg  100 mg Oral Daily Jadapalle, Sree, MD   100 mg at 05/19/24 9175   fluPHENAZine  (PROLIXIN ) tablet 10 mg  10 mg Oral BID Shrivastava, Aryendra, MD   10 mg at 05/19/24 9175   Or   fluPHENAZine  (PROLIXIN ) injection 5 mg  5 mg Intramuscular BID Shrivastava, Aryendra, MD       magnesium  hydroxide (MILK OF MAGNESIA) suspension 30 mL  30 mL Oral Daily PRN Coleman, Carolyn H, NP       OLANZapine  (ZYPREXA ) injection 5 mg  5 mg Intramuscular TID PRN Mardy Elveria DEL, NP   5 mg at 04/12/24 2230   OLANZapine  zydis (ZYPREXA ) disintegrating tablet 5 mg  5 mg Oral TID PRN Coleman, Carolyn H, NP       polyethylene glycol (MIRALAX  / GLYCOLAX ) packet 17 g  17 g Oral Daily PRN Donnelly Mellow, MD   17 g at 05/10/24 0830   PTA Medications: Medications Prior to Admission  Medication Sig Dispense Refill Last Dose/Taking   docusate sodium  (COLACE) 100 MG capsule Take 100 mg by mouth daily.      omega-3 acid ethyl esters (LOVAZA) 1 g capsule Take 1 g by mouth daily.       Patient Stressors: Traumatic event    Patient Strengths: Communication skills   Treatment Modalities: Medication Management, Group therapy, Case management,  1 to 1 session with clinician, Psychoeducation, Recreational therapy.   Physician Treatment Plan for Primary Diagnosis: Delusional disorder [facility]) Long Term  Goal(s): Improvement in symptoms so as ready for discharge   Short Term Goals: Ability to identify changes in lifestyle to reduce recurrence of condition will improve Ability to verbalize feelings will improve Ability to disclose and discuss suicidal ideas Ability to demonstrate self-control will improve Ability to identify and develop effective coping behaviors will improve  Medication Management: Evaluate patient's response, side effects, and tolerance of medication regimen.  Therapeutic Interventions: 1 to 1 sessions, Unit Group sessions and Medication administration.  Evaluation of Outcomes: Not Progressing  Physician Treatment Plan for Secondary Diagnosis: Principal Problem:   Delusional disorder (HCC)  Long Term Goal(s): Improvement in symptoms so as ready for discharge   Short Term Goals: Ability to identify changes in lifestyle to reduce recurrence of condition will improve Ability to verbalize feelings will improve Ability to disclose and discuss suicidal ideas Ability to demonstrate self-control will improve Ability to identify and develop effective coping behaviors will improve     Medication Management: Evaluate patient's response, side effects, and tolerance of medication regimen.  Therapeutic Interventions: 1 to 1 sessions, Unit Group sessions and Medication administration.  Evaluation of Outcomes: Not Progressing   RN Treatment Plan for Primary Diagnosis: Delusional disorder [facility]) Long Term Goal(s): Knowledge of disease and therapeutic regimen to maintain health will improve  Short Term Goals: Ability to remain free from injury will improve, Ability to verbalize frustration and anger appropriately  will improve, Ability to demonstrate self-control, Ability to participate in decision making will improve, Ability to verbalize feelings will improve, Ability to disclose and discuss suicidal ideas, Ability to identify and develop effective coping behaviors will improve,  and Compliance with prescribed medications will improve  Medication Management: RN will administer medications as ordered by provider, will assess and evaluate patient's response and provide education to patient for prescribed medication. RN will report any adverse and/or side effects to prescribing provider.  Therapeutic Interventions: 1 on 1 counseling sessions, Psychoeducation, Medication administration, Evaluate responses to treatment, Monitor vital signs and CBGs as ordered, Perform/monitor CIWA, COWS, AIMS and Fall Risk screenings as ordered, Perform wound care treatments as ordered.  Evaluation of Outcomes: Not Progressing   LCSW Treatment Plan for Primary Diagnosis: Delusional disorder [facility]) Long Term Goal(s): Safe transition to appropriate next level of care at discharge, Engage patient in therapeutic group addressing interpersonal concerns.  Short Term Goals: Engage patient in aftercare planning with referrals and resources, Increase social support, Increase ability to appropriately verbalize feelings, Increase emotional regulation, Facilitate acceptance of mental health diagnosis and concerns, Facilitate patient progression through stages of change regarding substance use diagnoses and concerns, Identify triggers associated with mental health/substance abuse issues, and Increase skills for wellness and recovery  Therapeutic Interventions: Assess for all discharge needs, 1 to 1 time with Social worker, Explore available resources and support systems, Assess for adequacy in community support network, Educate family and significant other(s) on suicide prevention, Complete Psychosocial Assessment, Interpersonal group therapy.  Evaluation of Outcomes: Not Progressing   Progress in Treatment: Attending groups: No. Participating in groups: No. Taking medication as prescribed: Yes. Toleration medication: Yes. Family/Significant other contact made: Yes, individual(s) contacted:  Benjamin [name], borther, [phone] Patient understands diagnosis: No. Discussing patient identified problems/goals with staff: Yes. Medical problems stabilized or resolved: Yes. Denies suicidal/homicidal ideation: Yes. Issues/concerns per patient self-inventory: No. Other:   New problem(s) identified: No, Describe:  05/14/24 Update: None   New Short Term/Long Term Goal(s): elimination of symptoms of psychosis, medication management for mood stabilization; elimination of SI thoughts; development of comprehensive mental wellness plan. Update 04/02/24: No changes at this time. Update 04/07/24: No changes at this time. Update 04/12/24: No changes at this time Update 04/18/24: No changes at this time  Update 04/23/24: No changes at this time. Update 04/28/2024: No changes at this time.  Update 05/04/2024: No changes at this time.  Update 05/09/2024:  No changes at this time. 05/14/24 Update: No changes at this time. 05/19/24 Update: No changes at this time.    Discharge Plan or Barriers: CSW will assist with appropriate discharge planning  Update 04/02/24: No changes at this time. Update 04/07/24: No changes at this time.Update 04/12/24: No changes at this time Update 04/18/24: CSW submitted report to APS. Care team looking at guardianship for pt at this time  Update 04/23/24: No changes at this time. Update 04/28/2024: No changes at this time. Update 05/04/2024: No changes at this time.  Update 05/09/2024:  [facility] APS continues to search for placement. 05/14/24 Update: [facility] APS continues to look for placement. CSW to continue to assess. 05/19/24 Update: No changes at this time.      Reason for Continuation of Hospitalization: Delusions  Medication stabilization   Estimated Length of Stay: 1 to 7 days Update 04/02/24: TBD. Update 04/07/24: TBD Update 04/12/24:TBD. Update 04/28/24:TBD Update 05/04/2024: TBD Update 05/09/2024:  TBD  05/14/24 Update: TBD. 05/19/24 Update: TBD.  Last 3 [place] Suicide Severity  Risk Score: Flowsheet Row Admission (Current) from 03/27/2024 in [facility] BE[facility] ED from 03/26/2024 in [facility] Emergency Department at [facility] ED from 03/25/2024 in [facility]  C-SSRS RISK CATEGORY No Risk No Risk No Risk    Last [name] 2/9 Scores:     No data to display          Scribe for Treatment Team: Aldo CHRISTELLA Niece, KEN 05/19/2024 4:05 PM

## 2024-05-19 NOTE — Group Note (Signed)
 [facility] LCSW Group Therapy Note   Group Date: 05/19/2024 Start Time: 1330 End Time: 1415   Type of Therapy/Topic:  Group Therapy:  Emotion Regulation  Participation Level:  Did Not Attend   Mood:  Description of Group:    The purpose of this group is to assist patients in learning to regulate negative emotions and experience positive emotions. Patients will be guided to discuss ways in which they have been vulnerable to their negative emotions. These vulnerabilities will be juxtaposed with experiences of positive emotions or situations, and patients challenged to use positive emotions to combat negative ones. Special emphasis will be placed on coping with negative emotions in conflict situations, and patients will process healthy conflict resolution skills.  Therapeutic Goals: Patient will identify two positive emotions or experiences to reflect on in order to balance out negative emotions:  Patient will label two or more emotions that they find the most difficult to experience:  Patient will be able to demonstrate positive conflict resolution skills through discussion or role plays:   Summary of Patient Progress:   Did Not Attend    Therapeutic Modalities:   Cognitive Behavioral Therapy Feelings Identification Dialectical Behavioral Therapy   Aldo CHRISTELLA Niece, LCSW

## 2024-05-19 NOTE — Group Note (Signed)
 Date:  05/19/2024 Time:  10:52 AM  Group Topic/Focus:  Goals/Boundaries Group:   The focus of this group is to help patients establish daily goals to achieve during treatment and we also discussed what boundaries is and how to establish healthy boundaries in their relationships.     Participation Level:  Did Not Attend  Benjamin [name] 05/19/2024, 10:52 AM

## 2024-05-19 NOTE — Progress Notes (Signed)
 [name] MD Progress Note  05/19/2024 5:34 PM Benjamin [name]  MRN:  [id number] Benjamin [name] is a 78 year old male who presents to the inpatient geriatric psych unit after jumping from a two story building. Patient was originally seen at [name] who referred him to the ED who admitted him here on 03/27/2024. Patient reports that people from the [place] gang were trying to kill him and the only escape was through the bedroom window on the second floor. He currently lives in the house with Benjamin [name] and his family and has been living with them for 3-4 years without any problems. On 03/19/2024 he went to the doctor and when he got back home the family wouldn't let him leave. He states that after he realized the gang was going to kill him he barricaded the door with his bed and jumped out the window. When he landed he ran away until one of his neighbors found him and called 911.   Subjective: Chart is reviewed and discussed with the treatment team.  Patient is noted to be resting in bed.    He was seen in his room, patient continues to have challenges.  He denies any suicidal and homicidal thoughts.    Capacity :Able to understand medical problem Patient is unable to display any understanding of his medical problems or the current presentation leading up to admission Able to understand proposed treatment  Patient is unable to understand the proposed treatment with antipsychotics Patient remains paranoid and has cognitive impairment as he is unable to discuss his discharge planning.  Given his paranoia and delay in executive function patient is unable to make decisions regarding the treatment and disposition.    Sleep: Fair  Appetite:  Fair  Past Psychiatric History: see h&P  Family History:  Family History  Problem Relation Age of Onset   Colon cancer Neg Hx    Colon polyps Neg Hx    Stomach cancer Neg Hx    Esophageal cancer Neg Hx    Social History:  Social History    Substance and Sexual Activity  Alcohol Use No     Social History   Substance and Sexual Activity  Drug Use No    Social History   Socioeconomic History   Marital status: Single    Spouse name: Not on file   Number of children: Not on file   Years of education: Not on file   Highest education level: Not on file  Occupational History   Not on file  Tobacco Use   Smoking status: Former    Types: Cigarettes   Smokeless tobacco: Never  Vaping Use   Vaping status: Never Used  Substance and Sexual Activity   Alcohol use: No   Drug use: No   Sexual activity: Not on file  Other Topics Concern   Not on file  Social History Narrative   Not on file   Social Drivers of Health   Financial Resource Strain: Not on file  Food Insecurity: No Food Insecurity (03/27/2024)   Hunger Vital Sign    Worried About Running Out of Food in the Last Year: Never true    Ran Out of Food in the Last Year: Never true  Transportation Needs: No Transportation Needs (03/27/2024)   PRAPARE - Administrator, Civil Service (Medical): No    Lack of Transportation (Non-Medical): No  Physical Activity: Not on file  Stress: Not on file  Social Connections: Moderately Integrated (03/27/2024)   Social  Connection and Isolation Panel    Frequency of Communication with Friends and Family: Twice a week    Frequency of Social Gatherings with Friends and Family: Once a week    Attends Religious Services: 1 to 4 times per year    Active Member of [employer] or Organizations: No    Attends Engineer, structural: 1 to 4 times per year    Marital Status: Divorced   Past Medical History:  Past Medical History:  Diagnosis Date   Allergy    Arthritis    back   Prostate cancer (HCC) dx'd 2010   surg only    Past Surgical History:  Procedure Laterality Date   COLONOSCOPY     EYE SURGERY     PROSTATE SURGERY      Current Medications: Current Facility-Administered Medications  Medication Dose  Route Frequency Provider Last Rate Last Admin   acetaminophen  (TYLENOL ) tablet 650 mg  650 mg Oral Q6H PRN Coleman, Carolyn H, NP       alum & mag hydroxide-simeth (MAALOX/MYLANTA) 200-200-20 MG/5ML suspension 30 mL  30 mL Oral Q4H PRN Coleman, Carolyn H, NP       docusate sodium  (COLACE) capsule 100 mg  100 mg Oral Daily Jadapalle, Sree, MD   100 mg at 05/19/24 9175   fluPHENAZine  (PROLIXIN ) tablet 10 mg  10 mg Oral BID Shrivastava, Aryendra, MD   10 mg at 05/19/24 9175   Or   fluPHENAZine  (PROLIXIN ) injection 5 mg  5 mg Intramuscular BID Shrivastava, Aryendra, MD       magnesium  hydroxide (MILK OF MAGNESIA) suspension 30 mL  30 mL Oral Daily PRN Mardy Elveria DEL, NP       OLANZapine  (ZYPREXA ) injection 5 mg  5 mg Intramuscular TID PRN Mardy Elveria DEL, NP   5 mg at 04/12/24 2230   OLANZapine  zydis (ZYPREXA ) disintegrating tablet 5 mg  5 mg Oral TID PRN Mardy Elveria DEL, NP       polyethylene glycol (MIRALAX  / GLYCOLAX ) packet 17 g  17 g Oral Daily PRN Donnelly Mellow, MD   17 g at 05/10/24 0830    Lab Results:  Results for orders placed or performed during the hospital encounter of 03/27/24 (from the past 48 hours)  Comprehensive metabolic panel     Status: Abnormal   Collection Time: 05/18/24  7:06 AM  Result Value Ref Range   Sodium 138 135 - 145 mmol/L   Potassium 4.1 3.5 - 5.1 mmol/L   Chloride 101 98 - 111 mmol/L   CO2 28 22 - 32 mmol/L   Glucose, Bld 96 70 - 99 mg/dL    Comment: Glucose reference range applies only to samples taken after fasting for at least 8 hours.   BUN 16 8 - 23 mg/dL   Creatinine, Ser 9.15 0.61 - 1.24 mg/dL   Calcium 9.0 8.9 - 89.6 mg/dL   Total Protein 6.5 6.5 - 8.1 g/dL   Albumin 3.1 (L) 3.5 - 5.0 g/dL   AST 22 15 - 41 U/L   ALT 17 0 - 44 U/L   Alkaline Phosphatase 47 38 - 126 U/L   Total Bilirubin 0.6 0.0 - 1.2 mg/dL   GFR, Estimated >39 >39 mL/min    Comment: (NOTE) Calculated using the CKD-EPI Creatinine Equation (2021)    Anion gap 9 5 -  15    Comment: Performed at [facility], [address] [place], [place] [address]  CBC with Differential/Platelet  Status: Abnormal   Collection Time: 05/18/24  7:06 AM  Result Value Ref Range   WBC 5.3 4.0 - 10.5 K/uL   RBC 4.26 4.22 - 5.81 MIL/uL   Hemoglobin 12.4 (L) 13.0 - 17.0 g/dL   HCT 61.1 (L) 60.9 - 47.9 %   MCV 91.1 80.0 - 100.0 fL   MCH 29.1 26.0 - 34.0 pg   MCHC 32.0 30.0 - 36.0 g/dL   RDW 87.3 88.4 - 84.4 %   Platelets 228 150 - 400 K/uL   nRBC 0.0 0.0 - 0.2 %   Neutrophils Relative % 49 %   Neutro Abs 2.6 1.7 - 7.7 K/uL   Lymphocytes Relative 30 %   Lymphs Abs 1.6 0.7 - 4.0 K/uL   Monocytes Relative 10 %   Monocytes Absolute 0.5 0.1 - 1.0 K/uL   Eosinophils Relative 10 %   Eosinophils Absolute 0.5 0.0 - 0.5 K/uL   Basophils Relative 1 %   Basophils Absolute 0.0 0.0 - 0.1 K/uL   Immature Granulocytes 0 %   Abs Immature Granulocytes 0.01 0.00 - 0.07 K/uL    Comment: Performed at [facility], [address] [place], [place] [address]     Blood Alcohol level:  Lab Results  Component Value Date   [facility] <15 03/26/2024    Metabolic Disorder Labs: Lab Results  Component Value Date   HGBA1C 5.6 04/02/2024   MPG 114 04/02/2024   No results found for: PROLACTIN Lab Results  Component Value Date   CHOL 146 04/02/2024   TRIG 37 04/02/2024   HDL 43 04/02/2024   CHOLHDL 3.4 04/02/2024   VLDL 7 04/02/2024   LDLCALC 96 04/02/2024    Physical Findings: AIMS:  , ,  ,  ,    CIWA:    COWS:      Psychiatric Specialty Exam:  Presentation  General Appearance:  Appropriate for Environment; Bizarre  Eye Contact: Fair  Speech: Clear and Coherent  Speech Volume: Normal    Mood and Affect  Mood: Anxious  Affect: Congruent   Thought Process  Thought Processes: Disorganized  Descriptions of Associations:Intact  Orientation:Full (Time, Place and Person)  Thought Content: Paranoia at baseline Hallucinations:  Denies  Ideas of Reference: Paranoia at baseline Suicidal Thoughts: Denies  Homicidal Thoughts: Denies   Sensorium  Memory: Immediate Fair; Recent Fair; Remote Fair  Judgment: Impaired  Insight: Shallow   Executive Functions  Concentration: Fair  Attention Span: Fair  Recall: [employer] of Knowledge: Fair  Language: Fair   Psychomotor Activity  Psychomotor Activity: No data recorded  Musculoskeletal: Strength & Muscle Tone: within normal limits Gait & Station: normal Assets  Assets: Manufacturing systems engineer; Resilience    Physical Exam: Physical Exam Vitals and nursing note reviewed.    ROS Blood pressure 94/67, pulse 72, temperature 97.8 F (36.6 C), resp. rate 18, height 6' (1.829 m), weight 100 kg, SpO2 100%. Body mass index is 29.91 kg/m.  Diagnosis: Principal Problem:   Delusional disorder [facility])  Major Neurocognitive disorder  Clinical Decision Making: Patient currently admitted after jumping off a two-story building in the context of possible delusions as reported as the people in the house he lived for 4 years trying to kill him.  Patient needs to be monitored closely for ongoing psychosis and paranoid delusions.  Given patient's confusion and chronic paranoia, no family support, APS report has been made and it got screened in for further evaluation by APS.   Differential diagnosis include: unspecified psychosis,  delusion disorder, dementia, schizophrenia/schizoaffective disorder   Treatment Plan Summary: Requested the social work team to reach out to APS for safety concerns in the community as patient has chronic paranoia and confusion and is currently homeless patient remains confused and paranoid and spite of being compliant with medications.  Patient displays cognitive delay and deficits in executive function like planning, organizing and processing information.  At this point patient lacks capacity to make medical decisions.  We recommend  APS referral for possible legal guardianship as he has no family or legal next of kin to help with decision-making.   Safety and Monitoring:             -- InVoluntary admission to inpatient psychiatric unit for safety, stabilization and treatment             -- Daily contact with patient to assess and evaluate symptoms and progress in treatment             -- Patient's case to be discussed in multi-disciplinary team meeting             -- Observation Level: q15 minute checks             -- Vital signs:  q12 hours             -- Precautions: suicide, elopement, and assault   2. Psychiatric Diagnoses and Treatment:              Continue Prolixin  to 10 mg twice daily. discontinued Zyprexa .  -- The risks/benefits/side-effects/alternatives to this medication were discussed in detail with the patient and time was given for questions. The patient consents to medication trial.                -- Metabolic profile and EKG monitoring obtained while on an atypical antipsychotic (BMI: Lipid Panel: HbgA1c: QTc:)              -- Encouraged patient to participate in unit milieu and in scheduled group therapies                            3. Medical Issues Being Addressed:    No urgent medical needs noted  4. Discharge Planning:   -- Social work and case management to assist with discharge planning and identification of hospital follow-up needs prior to discharge  -- Estimated LOS: 3-4 days  Millie JONELLE Manners, MD 05/19/2024, 5:34 PM

## 2024-05-19 NOTE — Group Note (Signed)
 Date:  05/19/2024 Time:  8:41 PM  Group Topic/Focus:  Goals Group:   The focus of this group is to help patients establish daily goals to achieve during treatment and discuss how the patient can incorporate goal setting into their daily lives to aide in recovery.    Participation Level:  Active  Participation Quality:  Appropriate  Affect:  Appropriate  Cognitive:  Appropriate  Insight: Good  Engagement in Group:  Engaged  Modes of Intervention:  Discussion  Additional Comments:    Benjamin [name] 05/19/2024, 8:41 PM

## 2024-05-19 NOTE — Group Note (Signed)
 Date:  05/19/2024 Time:  5:39 PM  Group Topic/Focus:  Personal Choices and Values:   The focus of this group is to help patients assess and explore the importance of values in their lives, how their values affect their decisions, how they express their values and what opposes their expression.    Participation Level:  Active  Participation Quality:  Appropriate  Affect:  Appropriate  Cognitive:  Appropriate  Insight: Appropriate  Engagement in Group:  Engaged  Modes of Intervention:  Discussion  Additional Comments:  N/A  [name] Gavel 05/19/2024, 5:39 PM

## 2024-05-19 NOTE — Plan of Care (Signed)
  Problem: Nutrition: Goal: Adequate nutrition will be maintained Outcome: Progressing   Problem: Coping: Goal: Level of anxiety will decrease Outcome: Progressing   Problem: Activity: Goal: Interest or engagement in activities will improve Outcome: Progressing

## 2024-05-20 DIAGNOSIS — F22 Delusional disorders: Secondary | ICD-10-CM | POA: Diagnosis not present

## 2024-05-20 NOTE — Progress Notes (Signed)
 Mood/Behavior:  Pleasant and cooperative.  Mild confusion.    Psych assessment: Denies SI/HI and AVH.    Interaction / Group attendance:  Present in the milieu only for meals. Minimal interaction with peers and staff.  Attended 1 group.  Medication/ PRNs: Compliant with scheduled medications. No PRNs given.  Pain: Denies  15 min checks in place for safety.

## 2024-05-20 NOTE — Plan of Care (Signed)
  Problem: Education: Goal: Knowledge of General Education information will improve Description: Including pain rating scale, medication(s)/side effects and non-pharmacologic comfort measures Outcome: Progressing   Problem: Health Behavior/Discharge Planning: Goal: Ability to manage health-related needs will improve Outcome: Progressing   Problem: Clinical Measurements: Goal: Ability to maintain clinical measurements within normal limits will improve Outcome: Progressing Goal: Will remain free from infection Outcome: Progressing Goal: Diagnostic test results will improve Outcome: Progressing Goal: Respiratory complications will improve Outcome: Progressing Goal: Cardiovascular complication will be avoided Outcome: Progressing   Problem: Activity: Goal: Risk for activity intolerance will decrease Outcome: Progressing   Problem: Nutrition: Goal: Adequate nutrition will be maintained Outcome: Progressing   Problem: Coping: Goal: Level of anxiety will decrease Outcome: Progressing   Problem: Elimination: Goal: Will not experience complications related to bowel motility Outcome: Progressing Goal: Will not experience complications related to urinary retention Outcome: Progressing   Problem: Pain Managment: Goal: General experience of comfort will improve and/or be controlled Outcome: Progressing   Problem: Safety: Goal: Ability to remain free from injury will improve Outcome: Progressing   Problem: Skin Integrity: Goal: Risk for impaired skin integrity will decrease Outcome: Progressing   Problem: Education: Goal: Knowledge of [place] [facility] Education information/materials will improve Outcome: Progressing Goal: Emotional status will improve Outcome: Progressing Goal: Mental status will improve Outcome: Progressing Goal: Verbalization of understanding the information provided will improve Outcome: Progressing   Problem: Activity: Goal: Interest or  engagement in activities will improve Outcome: Progressing Goal: Sleeping patterns will improve Outcome: Progressing   Problem: Coping: Goal: Ability to verbalize frustrations and anger appropriately will improve Outcome: Progressing Goal: Ability to demonstrate self-control will improve Outcome: Progressing   Problem: Health Behavior/Discharge Planning: Goal: Identification of resources available to assist in meeting health care needs will improve Outcome: Progressing Goal: Compliance with treatment plan for underlying cause of condition will improve Outcome: Progressing   Problem: Physical Regulation: Goal: Ability to maintain clinical measurements within normal limits will improve Outcome: Progressing   Problem: Safety: Goal: Periods of time without injury will increase Outcome: Progressing

## 2024-05-20 NOTE — Progress Notes (Signed)
 [facility] MD Progress Note  05/20/2024 6:23 PM Eliasar Hlavaty  MRN:  [id number] Kenna Kirn is a 78 year old male who presents to the inpatient geriatric psych unit after jumping from a two story building. Patient was originally seen at [name] who referred him to the ED who admitted him here on 03/27/2024. Patient reports that people from the [place] gang were trying to kill him and the only escape was through the bedroom window on the second floor. He currently lives in the house with [name] and his family and has been living with them for 3-4 years without any problems. On 03/19/2024 he went to the doctor and when he got back home the family wouldn't let him leave. He states that after he realized the gang was going to kill him he barricaded the door with his bed and jumped out the window. When he landed he ran away until one of his neighbors found him and called 911.   Subjective: Chart is reviewed and discussed with the treatment team.    Patient seen in the milieu room he is denying any suicidal homicidal thoughts no side effects from current medication minimally engaging with peers.    Capacity :Able to understand medical problem Patient is unable to display any understanding of his medical problems or the current presentation leading up to admission Able to understand proposed treatment  Patient is unable to understand the proposed treatment with antipsychotics Patient remains paranoid and has cognitive impairment as he is unable to discuss his discharge planning.  Given his paranoia and delay in executive function patient is unable to make decisions regarding the treatment and disposition.    Sleep: Fair  Appetite:  Fair  Past Psychiatric History: see h&P  Family History:  Family History  Problem Relation Age of Onset   Colon cancer Neg Hx    Colon polyps Neg Hx    Stomach cancer Neg Hx    Esophageal cancer Neg Hx    Social History:  Social History    Substance and Sexual Activity  Alcohol Use No     Social History   Substance and Sexual Activity  Drug Use No    Social History   Socioeconomic History   Marital status: Single    Spouse name: Not on file   Number of children: Not on file   Years of education: Not on file   Highest education level: Not on file  Occupational History   Not on file  Tobacco Use   Smoking status: Former    Types: Cigarettes   Smokeless tobacco: Never  Vaping Use   Vaping status: Never Used  Substance and Sexual Activity   Alcohol use: No   Drug use: No   Sexual activity: Not on file  Other Topics Concern   Not on file  Social History Narrative   Not on file   Social Drivers of Health   Financial Resource Strain: Not on file  Food Insecurity: No Food Insecurity (03/27/2024)   Hunger Vital Sign    Worried About Running Out of Food in the Last Year: Never true    Ran Out of Food in the Last Year: Never true  Transportation Needs: No Transportation Needs (03/27/2024)   PRAPARE - Administrator, Civil Service (Medical): No    Lack of Transportation (Non-Medical): No  Physical Activity: Not on file  Stress: Not on file  Social Connections: Moderately Integrated (03/27/2024)   Social Connection and Isolation Panel  Frequency of Communication with Friends and Family: Twice a week    Frequency of Social Gatherings with Friends and Family: Once a week    Attends Religious Services: 1 to 4 times per year    Active Member of [employer] or Organizations: No    Attends Engineer, structural: 1 to 4 times per year    Marital Status: Divorced   Past Medical History:  Past Medical History:  Diagnosis Date   Allergy    Arthritis    back   Prostate cancer (HCC) dx'd 2010   surg only    Past Surgical History:  Procedure Laterality Date   COLONOSCOPY     EYE SURGERY     PROSTATE SURGERY      Current Medications: Current Facility-Administered Medications  Medication Dose  Route Frequency Provider Last Rate Last Admin   acetaminophen  (TYLENOL ) tablet 650 mg  650 mg Oral Q6H PRN Coleman, Carolyn H, NP       alum & mag hydroxide-simeth (MAALOX/MYLANTA) 200-200-20 MG/5ML suspension 30 mL  30 mL Oral Q4H PRN Coleman, Carolyn H, NP       docusate sodium  (COLACE) capsule 100 mg  100 mg Oral Daily Jadapalle, Sree, MD   100 mg at 05/20/24 9188   fluPHENAZine  (PROLIXIN ) tablet 10 mg  10 mg Oral BID Shrivastava, Aryendra, MD   10 mg at 05/20/24 9188   Or   fluPHENAZine  (PROLIXIN ) injection 5 mg  5 mg Intramuscular BID Shrivastava, Aryendra, MD       magnesium  hydroxide (MILK OF MAGNESIA) suspension 30 mL  30 mL Oral Daily PRN Mardy Elveria DEL, NP       OLANZapine  (ZYPREXA ) injection 5 mg  5 mg Intramuscular TID PRN Mardy Elveria DEL, NP   5 mg at 04/12/24 2230   OLANZapine  zydis (ZYPREXA ) disintegrating tablet 5 mg  5 mg Oral TID PRN Mardy Elveria DEL, NP       polyethylene glycol (MIRALAX  / GLYCOLAX ) packet 17 g  17 g Oral Daily PRN Donnelly Mellow, MD   17 g at 05/10/24 0830    Lab Results:  No results found for this or any previous visit (from the past 48 hours).    Blood Alcohol level:  Lab Results  Component Value Date   [name] <15 03/26/2024    Metabolic Disorder Labs: Lab Results  Component Value Date   HGBA1C 5.6 04/02/2024   MPG 114 04/02/2024   No results found for: PROLACTIN Lab Results  Component Value Date   CHOL 146 04/02/2024   TRIG 37 04/02/2024   HDL 43 04/02/2024   CHOLHDL 3.4 04/02/2024   VLDL 7 04/02/2024   LDLCALC 96 04/02/2024    Physical Findings: AIMS:  , ,  ,  ,    CIWA:    COWS:      Psychiatric Specialty Exam:  Presentation  General Appearance:  Appropriate for Environment; Bizarre  Eye Contact: Fair  Speech: Clear and Coherent  Speech Volume: Normal    Mood and Affect  Mood: Anxious  Affect: Congruent   Thought Process  Thought Processes: Disorganized  Descriptions of  Associations:Intact  Orientation:Full (Time, Place and Person)  Thought Content: Paranoia at baseline Hallucinations: Denies  Ideas of Reference: Paranoia at baseline Suicidal Thoughts: Denies  Homicidal Thoughts: Denies   Sensorium  Memory: Immediate Fair; Recent Fair; Remote Fair  Judgment: Impaired  Insight: Shallow   Executive Functions  Concentration: Fair  Attention Span: Fair  Recall: [employer] of Knowledge:  Fair  Language: Fair   Psychomotor Activity  Psychomotor Activity: No data recorded  Musculoskeletal: Strength & Muscle Tone: within normal limits Gait & Station: normal Assets  Assets: Manufacturing systems engineer; Resilience    Physical Exam: Physical Exam Vitals and nursing note reviewed.    ROS Blood pressure 111/65, pulse 63, temperature (!) 97.3 F (36.3 C), resp. rate 18, height 6' (1.829 m), weight 100 kg, SpO2 100%. Body mass index is 29.91 kg/m.  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  Patient needs to be monitored closely for ongoing psychosis and paranoid delusions.  Given patient's confusion and chronic paranoia, no family support, APS report has been made and it got screened in for further evaluation by APS.   Differential diagnosis include: unspecified psychosis, delusion disorder, dementia, schizophrenia/schizoaffective disorder   Treatment Plan Summary: Requested the social work team to reach out to APS for safety concerns in the community as patient has chronic paranoia and confusion and is currently homeless patient remains confused and paranoid and spite of being compliant with medications.  Patient displays cognitive delay and deficits in executive function like  planning, organizing and processing information.  At this point patient lacks capacity to make medical decisions.  We recommend APS referral for possible legal guardianship as he has no family or legal next of kin to help with decision-making.   Safety and Monitoring:             -- InVoluntary admission to inpatient psychiatric unit for safety, stabilization and treatment             -- Daily contact with patient to assess and evaluate symptoms and progress in treatment             -- Patient's case to be discussed in multi-disciplinary team meeting             -- Observation Level: q15 minute checks             -- Vital signs:  q12 hours             -- Precautions: suicide, elopement, and assault   2. Psychiatric Diagnoses and Treatment:              Continue Prolixin  to 10 mg twice daily. discontinued Zyprexa .  -- The risks/benefits/side-effects/alternatives to this medication were discussed in detail with the patient and time was given for questions. The patient consents to medication trial.                -- Metabolic profile and EKG monitoring obtained while on an atypical antipsychotic (BMI: Lipid Panel: HbgA1c: QTc:)              -- Encouraged patient to participate in unit milieu and in scheduled group therapies                            3. Medical Issues Being Addressed:    No urgent medical needs noted  4. Discharge Planning:   -- Social work and case management to assist with discharge planning and identification of hospital follow-up needs prior to discharge  -- Estimated LOS: 3-4 days  Millie JONELLE Manners, MD 05/20/2024, 6:23 PM

## 2024-05-20 NOTE — Plan of Care (Signed)
  Problem: Education: Goal: Mental status will improve Outcome: Progressing   Problem: Activity: Goal: Interest or engagement in activities will improve Outcome: Progressing   Problem: Health Behavior/Discharge Planning: Goal: Compliance with treatment plan for underlying cause of condition will improve Outcome: Progressing   

## 2024-05-20 NOTE — Group Note (Signed)
 Date:  05/20/2024 Time:  10:57 AM  Group Topic/Focus:  Goals Group:   The focus of this group is to help patients establish daily goals to achieve during treatment and discuss how the patient can incorporate goal setting into their daily lives to aide in recovery.    Participation Level:  Did Not Attend  Benjamin [name] 05/20/2024, 10:57 AM

## 2024-05-20 NOTE — Group Note (Signed)
 Date:  05/20/2024 Time:  5:24 PM  Group Topic/Focus:  Building Self Esteem:   The Focus of this group is helping patients become aware of the effects of self-esteem on their lives, the things they and others do that enhance or undermine their self-esteem, seeing the relationship between their level of self-esteem and the choices they make and learning ways to enhance self-esteem.    Participation Level:  Active  Participation Quality:  Appropriate  Affect:  Appropriate  Cognitive:  Appropriate  Insight: Appropriate  Engagement in Group:  Engaged  Modes of Intervention:  Discussion  Additional Comments:  N/A [name] Gavel 05/20/2024, 5:24 PM

## 2024-05-20 NOTE — Progress Notes (Signed)
   05/20/24 2100  Psych Admission Type (Psych Patients Only)  Admission Status Involuntary  Psychosocial Assessment  Patient Complaints None  Eye Contact Brief  Facial Expression Flat  Affect Appropriate to circumstance  Speech Soft  Interaction Minimal  Motor Activity Slow  Appearance/Hygiene Unremarkable;In scrubs  Behavior Characteristics Cooperative;Appropriate to situation  Mood Pleasant  Thought Process  Coherency WDL  Content WDL  Delusions None reported or observed  Perception WDL  Hallucination None reported or observed  Judgment Impaired  Confusion Mild  Danger to Self  Current suicidal ideation? Denies  Danger to Others  Danger to Others None reported or observed   Mood/Behavior:   Pleasant and cooperative.    Psych assessment:  Denies SI/HI and AVH. Denies anxiety and derepression.   Interaction / Group attendance:  Present in the milieu. Appropriate interaction with peers and staff. Attends groups.    Medication/ PRNs: Compliant with scheduled medications.  No PRNs given.   Pain: Denies   15 min checks in place for safety.

## 2024-05-21 DIAGNOSIS — F22 Delusional disorders: Secondary | ICD-10-CM | POA: Diagnosis not present

## 2024-05-21 NOTE — Progress Notes (Signed)
   05/21/24 1218  Psych Admission Type (Psych Patients Only)  Admission Status Involuntary  Psychosocial Assessment  Patient Complaints None  Eye Contact Fair  Facial Expression Flat  Affect Appropriate to circumstance  Speech Soft  Interaction Minimal  Motor Activity Slow  Appearance/Hygiene Unremarkable;In scrubs  Behavior Characteristics Cooperative;Appropriate to situation  Mood Pleasant  Thought Process  Coherency WDL  Content WDL  Delusions None reported or observed  Perception WDL  Hallucination None reported or observed  Judgment Impaired  Confusion Mild  Danger to Self  Current suicidal ideation? Denies  Agreement Not to Harm Self Yes  Description of Agreement verbal  Danger to Others  Danger to Others None reported or observed   Pt awake and alert. Maintaining appropriate boundaries.  Denies SI, HI or AVH.  Q checks for safety in place.

## 2024-05-21 NOTE — Group Note (Signed)
 Physical/Occupational Therapy Group Note  Group Topic: Transfer Training   Group Date: 05/21/2024 Start Time: 1305 End Time: 1335 Facilitators: Philomena Buttermore, Alm Hamilton, PT   Group Description: Group educated on sequence and techniques to maximize safety with functional transfers.  Additionally, integrated education on impact of seating surfaces, use of assistive device and management of orthostasis with movement transitions.  Patients actively engaged with functional transfers (sit/stand) from various seating surfaces, with and without assist devices, working to integrate and retain education provided during session.  Allowed time for questions and further discussion on mobility concerns/needs.   Therapeutic Goal(s):  Identify and demonstrate safe technique for sit/stand transfers from various seating surfaces. Identify and demonstrate safe use of assistive devices with basic transfers and simple mobility. Identify and demonstrate ability to recognize signs/symptoms of orthostasis and appropriate compensatory/safety techniques.  Individual Participation:  Pt actively participated with the discussion and physical activity components of the session.  Pt was steady with standing activities throughout the session.  Participation Level: Active and Engaged   Participation Quality: Minimal Cues   Behavior: Alert and Appropriate   Speech/Thought Process: Organized   Affect/Mood: Appropriate   Insight: Fair   Judgement: Fair   Individualization: Per above  Modes of Intervention: Activity, Discussion, and Education  Patient Response to Interventions:  Attentive, Engaged, Interested , and Receptive   Plan: Continue to engage patient in PT/OT groups 1 - 2x/week.  CHARM Hamilton Bertin PT, DPT 05/21/24, 2:36 PM

## 2024-05-21 NOTE — Group Note (Signed)
 Date:  05/21/2024 Time:  11:32 PM  Group Topic/Focus:  Wrap-Up Group:   The focus of this group is to help patients review their daily goal of treatment and discuss progress on daily workbooks.    Participation Level:  Active  Participation Quality:  Appropriate  Affect:  Appropriate  Cognitive:  Alert  Insight: Appropriate  Engagement in Group:  Engaged  Modes of Intervention:  Discussion  Additional Comments:    Benjamin [name] Bunker 05/21/2024, 11:32 PM

## 2024-05-21 NOTE — Group Note (Signed)
 Date:  05/21/2024 Time:  12:35 AM  Group Topic/Focus:  Wrap-Up Group:   The focus of this group is to help patients review their daily goal of treatment and discuss progress on daily workbooks.    Participation Level:  Active  Participation Quality:  Appropriate  Affect:  Appropriate  Cognitive:  Appropriate  Insight: Appropriate  Engagement in Group:  Engaged  Modes of Intervention:  Discussion  Additional Comments:    [name] 05/21/2024, 12:35 AM

## 2024-05-21 NOTE — Plan of Care (Signed)
  Problem: Education: Goal: Knowledge of General Education information will improve Description: Including pain rating scale, medication(s)/side effects and non-pharmacologic comfort measures Outcome: Progressing   Problem: Health Behavior/Discharge Planning: Goal: Ability to manage health-related needs will improve Outcome: Progressing   Problem: Clinical Measurements: Goal: Diagnostic test results will improve Outcome: Progressing Goal: Respiratory complications will improve Outcome: Progressing   Problem: Nutrition: Goal: Adequate nutrition will be maintained Outcome: Progressing   Problem: Coping: Goal: Level of anxiety will decrease Outcome: Progressing   Problem: Elimination: Goal: Will not experience complications related to bowel motility Outcome: Progressing

## 2024-05-21 NOTE — Progress Notes (Signed)
   05/21/24 2300  Psych Admission Type (Psych Patients Only)  Admission Status Involuntary  Psychosocial Assessment  Patient Complaints None  Eye Contact Fair  Facial Expression Flat  Affect Appropriate to circumstance  Speech Soft  Interaction Assertive  Motor Activity Slow  Appearance/Hygiene In scrubs  Behavior Characteristics Cooperative;Appropriate to situation  Mood Pleasant  Thought Process  Coherency WDL  Content WDL  Delusions None reported or observed  Perception WDL  Hallucination None reported or observed  Judgment Impaired  Confusion Mild  Danger to Self  Current suicidal ideation? Denies  Agreement Not to Harm Self Yes  Description of Agreement Verbal  Danger to Others  Danger to Others None reported or observed

## 2024-05-21 NOTE — Progress Notes (Signed)
 [facility] MD Progress Note  05/21/2024 1:34 PM Benjamin [name]  MRN:  [id number] Benjamin [name] is a 78 year old male who presents to the inpatient geriatric psych unit after jumping from a two story building. Patient was originally seen at [name] who referred him to the ED who admitted him here on 03/27/2024. Patient reports that people from the [place] gang were trying to kill him and the only escape was through the bedroom window on the second floor. He currently lives in the house with [name] and his family and has been living with them for 3-4 years without any problems. On 03/19/2024 he went to the doctor and when he got back home the family wouldn't let him leave. He states that after he realized the gang was going to kill him he barricaded the door with his bed and jumped out the window. When he landed he ran away until one of his neighbors found him and called 911.   Subjective: Chart is reviewed and discussed with the treatment team.  Patient is noted to be resting in bed.  He offers no complaints.  He denies side effects to medications.  He denies any physical complaints.  He is coming out of his room and participating in groups and attending to his ADLs.  Per nursing patient is taking his medications and has no behavioral problems.   Capacity :Able to understand medical problem Patient is unable to display any understanding of his medical problems or the current presentation leading up to admission Able to understand proposed treatment  Patient is unable to understand the proposed treatment with antipsychotics Patient remains paranoid and has cognitive impairment as he is unable to discuss his discharge planning.  Given his paranoia and delay in executive function patient is unable to make decisions regarding the treatment and disposition.    Sleep: Fair  Appetite:  Fair  Past Psychiatric History: see h&P  Family History:  Family History  Problem Relation Age of  Onset   Colon cancer Neg Hx    Colon polyps Neg Hx    Stomach cancer Neg Hx    Esophageal cancer Neg Hx    Social History:  Social History   Substance and Sexual Activity  Alcohol Use No     Social History   Substance and Sexual Activity  Drug Use No    Social History   Socioeconomic History   Marital status: Single    Spouse name: Not on file   Number of children: Not on file   Years of education: Not on file   Highest education level: Not on file  Occupational History   Not on file  Tobacco Use   Smoking status: Former    Types: Cigarettes   Smokeless tobacco: Never  Vaping Use   Vaping status: Never Used  Substance and Sexual Activity   Alcohol use: No   Drug use: No   Sexual activity: Not on file  Other Topics Concern   Not on file  Social History Narrative   Not on file   Social Drivers of Health   Financial Resource Strain: Not on file  Food Insecurity: No Food Insecurity (03/27/2024)   Hunger Vital Sign    Worried About Running Out of Food in the Last Year: Never true    Ran Out of Food in the Last Year: Never true  Transportation Needs: No Transportation Needs (03/27/2024)   PRAPARE - Administrator, Civil Service (Medical): No  Lack of Transportation (Non-Medical): No  Physical Activity: Not on file  Stress: Not on file  Social Connections: Moderately Integrated (03/27/2024)   Social Connection and Isolation Panel    Frequency of Communication with Friends and Family: Twice a week    Frequency of Social Gatherings with Friends and Family: Once a week    Attends Religious Services: 1 to 4 times per year    Active Member of [employer] or Organizations: No    Attends Engineer, structural: 1 to 4 times per year    Marital Status: Divorced   Past Medical History:  Past Medical History:  Diagnosis Date   Allergy    Arthritis    back   Prostate cancer (HCC) dx'd 2010   surg only    Past Surgical History:  Procedure Laterality Date    COLONOSCOPY     EYE SURGERY     PROSTATE SURGERY      Current Medications: Current Facility-Administered Medications  Medication Dose Route Frequency Provider Last Rate Last Admin   acetaminophen  (TYLENOL ) tablet 650 mg  650 mg Oral Q6H PRN Coleman, Carolyn H, NP       alum & mag hydroxide-simeth (MAALOX/MYLANTA) 200-200-20 MG/5ML suspension 30 mL  30 mL Oral Q4H PRN Coleman, Carolyn H, NP       docusate sodium  (COLACE) capsule 100 mg  100 mg Oral Daily Ruel Dimmick, MD   100 mg at 05/21/24 0830   fluPHENAZine  (PROLIXIN ) tablet 10 mg  10 mg Oral BID Shrivastava, Aryendra, MD   10 mg at 05/21/24 0830   Or   fluPHENAZine  (PROLIXIN ) injection 5 mg  5 mg Intramuscular BID Shrivastava, Aryendra, MD       magnesium  hydroxide (MILK OF MAGNESIA) suspension 30 mL  30 mL Oral Daily PRN Mardy Elveria DEL, NP       OLANZapine  (ZYPREXA ) injection 5 mg  5 mg Intramuscular TID PRN Mardy Elveria DEL, NP   5 mg at 04/12/24 2230   OLANZapine  zydis (ZYPREXA ) disintegrating tablet 5 mg  5 mg Oral TID PRN Mardy Elveria DEL, NP       polyethylene glycol (MIRALAX  / GLYCOLAX ) packet 17 g  17 g Oral Daily PRN Donnelly Mellow, MD   17 g at 05/10/24 0830    Lab Results:  No results found for this or any previous visit (from the past 48 hours).    Blood Alcohol level:  Lab Results  Component Value Date   [facility] <15 03/26/2024    Metabolic Disorder Labs: Lab Results  Component Value Date   HGBA1C 5.6 04/02/2024   MPG 114 04/02/2024   No results found for: PROLACTIN Lab Results  Component Value Date   CHOL 146 04/02/2024   TRIG 37 04/02/2024   HDL 43 04/02/2024   CHOLHDL 3.4 04/02/2024   VLDL 7 04/02/2024   LDLCALC 96 04/02/2024    Physical Findings: AIMS:  , ,  ,  ,    CIWA:    COWS:      Psychiatric Specialty Exam:  Presentation  General Appearance:  Appropriate for Environment; Bizarre  Eye Contact: Fair  Speech: Clear and Coherent  Speech Volume: Normal    Mood  and Affect  Mood: Anxious  Affect: Congruent   Thought Process  Thought Processes: Disorganized  Descriptions of Associations:Intact  Orientation:Full (Time, Place and Person)  Thought Content: Paranoia at baseline Hallucinations: Denies  Ideas of Reference: Paranoia at baseline Suicidal Thoughts: Denies  Homicidal Thoughts: Denies   Sensorium  Memory: Immediate Fair; Recent Fair; Remote Fair  Judgment: Impaired  Insight: Shallow   Executive Functions  Concentration: Fair  Attention Span: Fair  Recall: [employer] of Knowledge: Fair  Language: Fair   Psychomotor Activity  Psychomotor Activity: No data recorded  Musculoskeletal: Strength & Muscle Tone: within normal limits Gait & Station: normal Assets  Assets: Manufacturing systems engineer; Resilience    Physical Exam: Physical Exam Vitals and nursing note reviewed.    ROS Blood pressure (!) 87/59, pulse 69, temperature 97.8 F (36.6 C), resp. rate 18, height 6' (1.829 m), weight 100 kg, SpO2 100%. Body mass index is 29.91 kg/m.  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  Patient needs to be monitored closely for ongoing psychosis and paranoid delusions.  Given patient's confusion and chronic paranoia, no family support, APS report has been made and it got screened in for further evaluation by APS.   Differential diagnosis include: unspecified psychosis, delusion disorder, dementia, schizophrenia/schizoaffective disorder   Treatment Plan Summary: Requested the social work team to reach out to APS for safety concerns in the community as patient has chronic paranoia and confusion and is currently homeless patient remains confused and paranoid  and spite of being compliant with medications.  Patient displays cognitive delay and deficits in executive function like planning, organizing and processing information.  At this point patient lacks capacity to make medical decisions.  We recommend APS referral for possible legal guardianship as he has no family or legal next of kin to help with decision-making.   Safety and Monitoring:             -- InVoluntary admission to inpatient psychiatric unit for safety, stabilization and treatment             -- Daily contact with patient to assess and evaluate symptoms and progress in treatment             -- Patient's case to be discussed in multi-disciplinary team meeting             -- Observation Level: q15 minute checks             -- Vital signs:  q12 hours             -- Precautions: suicide, elopement, and assault   2. Psychiatric Diagnoses and Treatment:              Continue Prolixin  to 10 mg twice daily. discontinued Zyprexa .  -- The risks/benefits/side-effects/alternatives to this medication were discussed in detail with the patient and time was given for questions. The patient consents to medication trial.                -- Metabolic profile and EKG monitoring obtained while on an atypical antipsychotic (BMI: Lipid Panel: HbgA1c: QTc:)              -- Encouraged patient to participate in unit milieu and in scheduled group therapies                            3. Medical Issues Being Addressed:    No urgent medical needs noted  4. Discharge Planning:   -- Social work and case management to  assist with discharge planning and identification of hospital follow-up needs prior to discharge  -- Estimated LOS: 3-4 days  Allyn Foil, MD 05/21/2024, 1:34 PM

## 2024-05-21 NOTE — Group Note (Signed)
 Date:  05/21/2024 Time:  11:11 AM  Group Topic/Focus:  Recovery Goals:   The focus of this group is to identify appropriate goals for recovery and establish a plan to achieve them.    Participation Level:  Active  Participation Quality:  Appropriate  Affect:  Appropriate  Cognitive:  Appropriate  Insight: Appropriate  Engagement in Group:  Engaged  Modes of Intervention:  Discussion   Benjamin [name] 05/21/2024, 11:11 AM

## 2024-05-21 NOTE — Group Note (Signed)
 Recreation Therapy Group Note   Group Topic:Emotion Expression  Group Date: 05/21/2024 Start Time: 1500 End Time: 1600 Facilitators: Celestia Jeoffrey BRAVO, LRT, CTRS Location: Dayroom  Group Description: Painting a Diplomatic Services operational officer. Patients and LRT discuss what it means to be "at peace", what it feels like physically and mentally. Pts are given a canvas and watercolor paint to use and encouraged to draw their idea of a peaceful place. Pts and LRT discuss how they use this in their daily life post discharge. Pts are encouraged to take their canvas home with them as a reminder to find their peaceful place whenever they are feeling depressed, anxious, etc.    Goal Area(s) Addressed:  Patient will identify what it means to experience a "peaceful" emotion. Patient will identify a new coping skill.  Patient will express their emotions through art. Patients will increase communication by talking with LRT and peers while in group.   Affect/Mood: Flat   Participation Level: Moderate    Clinical Observations/Individualized Feedback: Kayvan was present in the craft room. Pt shared that his peaceful place is a 3 headed God. Pt minimally interacted with LRT and peers while present. Pt got up and left group early and did not retuern.   Plan: Continue to engage patient in RT group sessions 2-3x/week.   Jeoffrey BRAVO Celestia, LRT, CTRS 05/21/2024 4:55 PM

## 2024-05-22 DIAGNOSIS — F22 Delusional disorders: Secondary | ICD-10-CM | POA: Diagnosis not present

## 2024-05-22 NOTE — Group Note (Signed)
 Date:  05/22/2024 Time:  11:18 PM  Group Topic/Focus:  Wrap-Up Group:   The focus of this group is to help patients review their daily goal of treatment and discuss progress on daily workbooks.    Participation Level:  Active  Participation Quality:  Appropriate  Affect:  Appropriate  Cognitive:  Alert  Insight: Appropriate  Engagement in Group:  Engaged  Modes of Intervention:  Discussion  Additional Comments:    Benjamin [name] [name] 05/22/2024, 11:18 PM

## 2024-05-22 NOTE — Group Note (Signed)
 [facility] LCSW Group Therapy Note   Group Date: 05/22/2024 Start Time: 1315 End Time: 1345   Type of Therapy/Topic:  Group Therapy:  Emotion Regulation  Participation Level:  Did Not Attend   Mood:  Description of Group:    The purpose of this group is to assist patients in learning to regulate negative emotions and experience positive emotions. Patients will be guided to discuss ways in which they have been vulnerable to their negative emotions. These vulnerabilities will be juxtaposed with experiences of positive emotions or situations, and patients challenged to use positive emotions to combat negative ones. Special emphasis will be placed on coping with negative emotions in conflict situations, and patients will process healthy conflict resolution skills.  Therapeutic Goals: Patient will identify two positive emotions or experiences to reflect on in order to balance out negative emotions:  Patient will label two or more emotions that they find the most difficult to experience:  Patient will be able to demonstrate positive conflict resolution skills through discussion or role plays:   Summary of Patient Progress:   X    Therapeutic Modalities:   Cognitive Behavioral Therapy Feelings Identification Dialectical Behavioral Therapy   Benjamin [name], LCSWA

## 2024-05-22 NOTE — Progress Notes (Signed)
(  Sleep Hours) - 9.25 (Any PRNs that were needed, meds refused, or side effects to meds)- n/a (Any disturbances and when (visitation, over night)- n/a (Concerns raised by the patient)- n/a  (SI/HI/AVH)- denies

## 2024-05-22 NOTE — Progress Notes (Signed)
   05/22/24 0717  Psych Admission Type (Psych Patients Only)  Admission Status Involuntary  Psychosocial Assessment  Patient Complaints None  Eye Contact Fair  Facial Expression Flat  Affect Appropriate to circumstance  Speech Soft  Interaction Assertive  Motor Activity Slow  Appearance/Hygiene In scrubs  Behavior Characteristics Cooperative  Mood Pleasant  Thought Process  Coherency WDL  Content WDL  Delusions None reported or observed  Perception WDL  Hallucination None reported or observed  Judgment Impaired  Confusion Mild  Danger to Self  Current suicidal ideation? Denies  Danger to Others  Danger to Others None reported or observed

## 2024-05-22 NOTE — Group Note (Signed)
 Recreation Therapy Group Note   Group Topic:Leisure Education  Group Date: 05/22/2024 Start Time: 1500 End Time: 1600 Facilitators: Celestia Jeoffrey BRAVO, LRT, CTRS Location: Dayroom  Group Description: Leisure. Patients were given the option to choose from coloring, drawing, listening to music or singing karaoke. LRT and pts discussed the meaning of leisure, the importance of participating in leisure during their free time/when they're outside of the hospital, as well as how our leisure interests can also serve as coping skills.   Goal Area(s) Addressed:  Patient will identify a current leisure interest.  Patient will learn the definition of "leisure". Patient will practice making a positive decision. Patient will have the opportunity to try a new leisure activity. Patient will communicate with peers and LRT.     Affect/Mood: Appropriate and Flat   Participation Level: Non-verbal    Clinical Observations/Individualized Feedback: Zariah was present in the dayroom at the time of group. Pt chose to sit with his eyes closed and a smirk on his face while listening to music.   Plan: Continue to engage patient in RT group sessions 2-3x/week.   Jeoffrey BRAVO Celestia, LRT, CTRS 05/22/2024 4:39 PM

## 2024-05-22 NOTE — Group Note (Signed)
 Recreation Therapy Group Note   Group Topic:Stress Management  Group Date: 05/22/2024 Start Time: 1100 End Time: 1135 Facilitators: Celestia Jeoffrey BRAVO, LRT, CTRS Location: Dayroom  Group Description: Meditation. LRT and patients discussed what they know about meditation and mindfulness. LRT played a Deep Breathing Meditation exercise script for patients to follow along to. LRT and patients discussed how meditation and deep breathing can be used as a coping skill post--discharge to help manage symptoms of stress.   Goal Area(s) Addressed: Patient will practice using relaxation technique. Patient will identify a new coping skill.  Patient will follow multistep directions to reduce anxiety and stress.   Affect/Mood: N/A   Participation Level: Did not attend    Clinical Observations/Individualized Feedback: Patient did not attend group.   Plan: Continue to engage patient in RT group sessions 2-3x/week.   [address] LRT, CTRS 05/22/2024 1:42 PM

## 2024-05-22 NOTE — Progress Notes (Signed)
 [facility] MD Progress Note  05/22/2024 1:54 PM Benjamin [name]  MRN:  [id number] Benjamin [name] is a 78 year old male who presents to the inpatient geriatric psych unit after jumping from a two story building. Patient was originally seen at [facility] who referred him to the ED who admitted him here on 03/27/2024. Patient reports that people from the [place] gang were trying to kill him and the only escape was through the bedroom window on the second floor. He currently lives in the house with [name] and his family and has been living with them for 3-4 years without any problems. On 03/19/2024 he went to the doctor and when he got back home the family wouldn't let him leave. He states that after he realized the gang was going to kill him he barricaded the door with his bed and jumped out the window. When he landed he ran away until one of his neighbors found him and called 911.   Subjective: Chart is reviewed and discussed with the treatment team.  Patient is noted to be walking in the day room.  He offers no complaints.  He remains very confused but is pleasant.  He denies SI/HI/plan.  He denies auditory/visual hallucinations.  Per nursing staff patient is not displaying any aggressive behaviors and is redirectable.  Patient is attending to his ADLs with instructions given by the staff members.   Capacity :Able to understand medical problem Patient is unable to display any understanding of his medical problems or the current presentation leading up to admission Able to understand proposed treatment  Patient is unable to understand the proposed treatment with antipsychotics Patient remains paranoid and has cognitive impairment as he is unable to discuss his discharge planning.  Given his paranoia and delay in executive function patient is unable to make decisions regarding the treatment and disposition.    Sleep: Fair  Appetite:  Fair  Past Psychiatric History: see h&P  Family  History:  Family History  Problem Relation Age of Onset   Colon cancer Neg Hx    Colon polyps Neg Hx    Stomach cancer Neg Hx    Esophageal cancer Neg Hx    Social History:  Social History   Substance and Sexual Activity  Alcohol Use No     Social History   Substance and Sexual Activity  Drug Use No    Social History   Socioeconomic History   Marital status: Single    Spouse name: Not on file   Number of children: Not on file   Years of education: Not on file   Highest education level: Not on file  Occupational History   Not on file  Tobacco Use   Smoking status: Former    Types: Cigarettes   Smokeless tobacco: Never  Vaping Use   Vaping status: Never Used  Substance and Sexual Activity   Alcohol use: No   Drug use: No   Sexual activity: Not on file  Other Topics Concern   Not on file  Social History Narrative   Not on file   Social Drivers of Health   Financial Resource Strain: Not on file  Food Insecurity: No Food Insecurity (03/27/2024)   Hunger Vital Sign    Worried About Running Out of Food in the Last Year: Never true    Ran Out of Food in the Last Year: Never true  Transportation Needs: No Transportation Needs (03/27/2024)   PRAPARE - Administrator, Civil Service (Medical):  No    Lack of Transportation (Non-Medical): No  Physical Activity: Not on file  Stress: Not on file  Social Connections: Moderately Integrated (03/27/2024)   Social Connection and Isolation Panel    Frequency of Communication with Friends and Family: Twice a week    Frequency of Social Gatherings with Friends and Family: Once a week    Attends Religious Services: 1 to 4 times per year    Active Member of [employer] or Organizations: No    Attends Engineer, structural: 1 to 4 times per year    Marital Status: Divorced   Past Medical History:  Past Medical History:  Diagnosis Date   Allergy    Arthritis    back   Prostate cancer (HCC) dx'd 2010   surg only     Past Surgical History:  Procedure Laterality Date   COLONOSCOPY     EYE SURGERY     PROSTATE SURGERY      Current Medications: Current Facility-Administered Medications  Medication Dose Route Frequency Provider Last Rate Last Admin   acetaminophen  (TYLENOL ) tablet 650 mg  650 mg Oral Q6H PRN Coleman, Carolyn H, NP       alum & mag hydroxide-simeth (MAALOX/MYLANTA) 200-200-20 MG/5ML suspension 30 mL  30 mL Oral Q4H PRN Coleman, Carolyn H, NP       docusate sodium  (COLACE) capsule 100 mg  100 mg Oral Daily [place]ha [place], MD   100 mg at 05/22/24 0740   fluPHENAZine  (PROLIXIN ) tablet 10 mg  10 mg Oral BID Shrivastava, Aryendra, MD   10 mg at 05/22/24 0740   Or   fluPHENAZine  (PROLIXIN ) injection 5 mg  5 mg Intramuscular BID Shrivastava, Aryendra, MD       magnesium  hydroxide (MILK OF MAGNESIA) suspension 30 mL  30 mL Oral Daily PRN Mardy Elveria DEL, NP       OLANZapine  (ZYPREXA ) injection 5 mg  5 mg Intramuscular TID PRN Mardy Elveria DEL, NP   5 mg at 04/12/24 2230   OLANZapine  zydis (ZYPREXA ) disintegrating tablet 5 mg  5 mg Oral TID PRN Mardy Elveria DEL, NP       polyethylene glycol (MIRALAX  / GLYCOLAX ) packet 17 g  17 g Oral Daily PRN Donnelly Mellow, MD   17 g at 05/10/24 0830    Lab Results:  No results found for this or any previous visit (from the past 48 hours).    Blood Alcohol level:  Lab Results  Component Value Date   [name] <15 03/26/2024    Metabolic Disorder Labs: Lab Results  Component Value Date   HGBA1C 5.6 04/02/2024   MPG 114 04/02/2024   No results found for: PROLACTIN Lab Results  Component Value Date   CHOL 146 04/02/2024   TRIG 37 04/02/2024   HDL 43 04/02/2024   CHOLHDL 3.4 04/02/2024   VLDL 7 04/02/2024   LDLCALC 96 04/02/2024    Physical Findings: AIMS:  , ,  ,  ,    CIWA:    COWS:      Psychiatric Specialty Exam:  Presentation  General Appearance:  Appropriate for Environment; Bizarre  Eye Contact: Fair  Speech: Clear  and Coherent  Speech Volume: Normal    Mood and Affect  Mood: Anxious  Affect: Congruent   Thought Process  Thought Processes: Disorganized  Descriptions of Associations:Intact  Orientation:Full (Time, Place and Person)  Thought Content: Paranoia at baseline Hallucinations: Denies  Ideas of Reference: Paranoia at baseline Suicidal Thoughts: Denies  Homicidal Thoughts:  Denies   Sensorium  Memory: Immediate Fair; Recent Fair; Remote Fair  Judgment: Impaired  Insight: Shallow   Executive Functions  Concentration: Fair  Attention Span: Fair  Recall: [employer] of Knowledge: Fair  Language: Fair   Psychomotor Activity  Psychomotor Activity: No data recorded  Musculoskeletal: Strength & Muscle Tone: within normal limits Gait & Station: normal Assets  Assets: Manufacturing systems engineer; Resilience    Physical Exam: Physical Exam Vitals and nursing note reviewed.    ROS Blood pressure 120/83, pulse 67, temperature (!) 97.5 F (36.4 C), resp. rate 18, height 6' (1.829 m), weight 100 kg, SpO2 99%. Body mass index is 29.91 kg/m.  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  Patient needs to be monitored closely for ongoing psychosis and paranoid delusions.  Given patient's confusion and chronic paranoia, no family support, APS report has been made and it got screened in for further evaluation by APS.   Differential diagnosis include: unspecified psychosis, delusion disorder, dementia, schizophrenia/schizoaffective disorder   Treatment Plan Summary: Requested the social work team to reach out to APS for safety concerns in the community as patient has chronic paranoia and confusion and is  currently homeless patient remains confused and paranoid and spite of being compliant with medications.  Patient displays cognitive delay and deficits in executive function like planning, organizing and processing information.  At this point patient lacks capacity to make medical decisions.  We recommend APS referral for possible legal guardianship as he has no family or legal next of kin to help with decision-making.   Safety and Monitoring:             -- InVoluntary admission to inpatient psychiatric unit for safety, stabilization and treatment             -- Daily contact with patient to assess and evaluate symptoms and progress in treatment             -- Patient's case to be discussed in multi-disciplinary team meeting             -- Observation Level: q15 minute checks             -- Vital signs:  q12 hours             -- Precautions: suicide, elopement, and assault   2. Psychiatric Diagnoses and Treatment:              Continue Prolixin  to 10 mg twice daily. discontinued Zyprexa .  -- The risks/benefits/side-effects/alternatives to this medication were discussed in detail with the patient and time was given for questions. The patient consents to medication trial.                -- Metabolic profile and EKG monitoring obtained while on an atypical antipsychotic (BMI: Lipid Panel: HbgA1c: QTc:)              -- Encouraged patient to participate in unit milieu and in scheduled group therapies                            3. Medical Issues Being Addressed:    No urgent medical needs noted  4. Discharge Planning:   -- Social  work and case management to assist with discharge planning and identification of hospital follow-up needs prior to discharge  -- Estimated LOS: 3-4 days  Susan Arana, MD 05/22/2024, 1:54 PM

## 2024-05-22 NOTE — BHH Counselor (Signed)
 CSW missed call from [place], [phone], APS social worker.   CSW returned missed call, left HIPAA compliant VM requesting return call.   Lum Croft, MSW, [place] 05/22/2024 12:42 PM

## 2024-05-22 NOTE — Plan of Care (Signed)
  Problem: Coping: Goal: Level of anxiety will decrease Outcome: Progressing   Problem: Education: Goal: Emotional status will improve Outcome: Progressing Goal: Mental status will improve Outcome: Progressing   Problem: Activity: Goal: Sleeping patterns will improve Outcome: Progressing

## 2024-05-22 NOTE — Group Note (Signed)
 Date:  05/22/2024 Time:  10:25 AM  Group Topic/Focus:  Movement Therapy    Participation Level:  Active  Participation Quality:  Appropriate  Affect:  Appropriate  Cognitive:  Appropriate  Insight: Appropriate  Engagement in Group:  Engaged  Modes of Intervention:  Activity  Additional Comments:  none  [name] 05/22/2024, 10:25 AM

## 2024-05-23 DIAGNOSIS — F22 Delusional disorders: Secondary | ICD-10-CM | POA: Diagnosis not present

## 2024-05-23 NOTE — Progress Notes (Signed)
 [name] [name] Progress Note  05/23/2024 1:44 PM Benjamin [name]  MRN:  [id number] Benjamin [name] is a 78 year old male who presents to the inpatient geriatric psych unit after jumping from a two story building. Patient was originally seen at [facility] who referred him to the ED who admitted him here on 03/27/2024. Patient reports that people from the [place] gang were trying to kill him and the only escape was through the bedroom window on the second floor. He currently lives in the house with [name] and his family and has been living with them for 3-4 years without any problems. On 03/19/2024 he went to the doctor and when he got back home the family wouldn't let him leave. He states that after he realized the gang was going to kill him he barricaded the door with his bed and jumped out the window. When he landed he ran away until one of his neighbors found him and called 911.   Subjective: Chart is reviewed and discussed with the treatment team.  On assessment patient is noted to be resting in his bed.  Per nursing patient is coming out of his room, past picking in groups, having his meals, with redirection and help able to do his ADLs.  He remains extremely confused and is unable to answer any of the orientation questions.  He is taking his medications with no reported side effects.  He denies SI/HI/plan and denies auditory/visual hallucinations.     Capacity :Able to understand medical problem Patient is unable to display any understanding of his medical problems or the current presentation leading up to admission Able to understand proposed treatment  Patient is unable to understand the proposed treatment with antipsychotics Patient remains paranoid and has cognitive impairment as he is unable to discuss his discharge planning.  Given his paranoia and delay in executive function patient is unable to make decisions regarding the treatment and disposition.    Sleep:  Fair  Appetite:  Fair  Past Psychiatric History: see [name]&P  Family History:  Family History  Problem Relation Age of Onset   Colon cancer Neg Hx    Colon polyps Neg Hx    Stomach cancer Neg Hx    Esophageal cancer Neg Hx    Social History:  Social History   Substance and Sexual Activity  Alcohol Use No     Social History   Substance and Sexual Activity  Drug Use No    Social History   Socioeconomic History   Marital status: Single    Spouse name: Not on file   Number of children: Not on file   Years of education: Not on file   Highest education level: Not on file  Occupational History   Not on file  Tobacco Use   Smoking status: Former    Types: Cigarettes   Smokeless tobacco: Never  Vaping Use   Vaping status: Never Used  Substance and Sexual Activity   Alcohol use: No   Drug use: No   Sexual activity: Not on file  Other Topics Concern   Not on file  Social History Narrative   Not on file   Social Drivers of Health   Financial Resource Strain: Not on file  Food Insecurity: No Food Insecurity (03/27/2024)   Hunger Vital Sign    Worried About Running Out of Food in the Last Year: Never true    Ran Out of Food in the Last Year: Never true  Transportation Needs: No Transportation Needs (  03/27/2024)   PRAPARE - Administrator, Civil Service (Medical): No    Lack of Transportation (Non-Medical): No  Physical Activity: Not on file  Stress: Not on file  Social Connections: Moderately Integrated (03/27/2024)   Social Connection and Isolation Panel    Frequency of Communication with Friends and Family: Twice a week    Frequency of Social Gatherings with Friends and Family: Once a week    Attends Religious Services: 1 to 4 times per year    Active Member of [employer] or Organizations: No    Attends Engineer, structural: 1 to 4 times per year    Marital Status: Divorced   Past Medical History:  Past Medical History:  Diagnosis Date   Allergy     Arthritis    back   Prostate cancer (HCC) dx'd 2010   surg only    Past Surgical History:  Procedure Laterality Date   COLONOSCOPY     EYE SURGERY     PROSTATE SURGERY      Current Medications: Current Facility-Administered Medications  Medication Dose Route Frequency Provider Last Rate Last Admin   acetaminophen  (TYLENOL ) tablet 650 mg  650 mg Oral Q6H PRN Coleman, Carolyn H, NP       alum & mag hydroxide-simeth (MAALOX/MYLANTA) 200-200-20 MG/5ML suspension 30 mL  30 mL Oral Q4H PRN Coleman, Carolyn H, NP       docusate sodium  (COLACE) capsule 100 mg  100 mg Oral Daily Benjamin Manlove, [name]   100 mg at 05/23/24 1039   fluPHENAZine  (PROLIXIN ) tablet 10 mg  10 mg Oral BID Shrivastava, Aryendra, [name]   10 mg at 05/23/24 1039   Or   fluPHENAZine  (PROLIXIN ) injection 5 mg  5 mg Intramuscular BID Shrivastava, Aryendra, [name]       magnesium  hydroxide (MILK OF MAGNESIA) suspension 30 mL  30 mL Oral Daily PRN Benjamin Elveria DEL, NP       OLANZapine  (ZYPREXA ) injection 5 mg  5 mg Intramuscular TID PRN Benjamin Elveria DEL, NP   5 mg at 04/12/24 2230   OLANZapine  zydis (ZYPREXA ) disintegrating tablet 5 mg  5 mg Oral TID PRN Benjamin Elveria DEL, NP       polyethylene glycol (MIRALAX  / GLYCOLAX ) packet 17 g  17 g Oral Daily PRN Benjamin [name]   17 g at 05/10/24 0830    Lab Results:  No results found for this or any previous visit (from the past 48 hours).    Blood Alcohol level:  Lab Results  Component Value Date   [facility] <15 03/26/2024    Metabolic Disorder Labs: Lab Results  Component Value Date   HGBA1C 5.6 04/02/2024   MPG 114 04/02/2024   No results found for: PROLACTIN Lab Results  Component Value Date   CHOL 146 04/02/2024   TRIG 37 04/02/2024   HDL 43 04/02/2024   CHOLHDL 3.4 04/02/2024   VLDL 7 04/02/2024   LDLCALC 96 04/02/2024    Physical Findings: AIMS:  , ,  ,  ,    CIWA:    COWS:      Psychiatric Specialty Exam:  Presentation  General Appearance:   Appropriate for Environment; Bizarre  Eye Contact: Fair  Speech: Clear and Coherent  Speech Volume: Normal    Mood and Affect  Mood: Anxious  Affect: Congruent   Thought Process  Thought Processes: Disorganized  Descriptions of Associations:Intact  Orientation:Full (Time, Place and Person)  Thought Content: Paranoia at baseline Hallucinations: Denies  Ideas of Reference: Paranoia at baseline Suicidal Thoughts: Denies  Homicidal Thoughts: Denies   Sensorium  Memory: Immediate Fair; Recent Fair; Remote Fair  Judgment: Impaired  Insight: Shallow   Executive Functions  Concentration: Fair  Attention Span: Fair  Recall: [employer] of Knowledge: Fair  Language: Fair   Psychomotor Activity  Psychomotor Activity: No data recorded  Musculoskeletal: Strength & Muscle Tone: within normal limits Gait & Station: normal Assets  Assets: Manufacturing systems engineer; Resilience    Physical Exam: Physical Exam Vitals and nursing note reviewed.    ROS Blood pressure 98/74, pulse 70, temperature 97.9 F (36.6 C), resp. rate 18, height 6' (1.829 m), weight 100 kg, SpO2 99%. Body mass index is 29.91 kg/m.  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  Patient needs to be monitored closely for ongoing psychosis and paranoid delusions.  Given patient's confusion and chronic paranoia, no family support, APS report has been made and it got screened in for further evaluation by APS.   Differential diagnosis include: unspecified psychosis, delusion disorder, dementia, schizophrenia/schizoaffective disorder   Treatment Plan Summary: Requested the social work team to reach out to APS for safety concerns  in the community as patient has chronic paranoia and confusion and is currently homeless patient remains confused and paranoid and spite of being compliant with medications.  Patient displays cognitive delay and deficits in executive function like planning, organizing and processing information.  At this point patient lacks capacity to make medical decisions.  We recommend APS referral for possible legal guardianship as he has no family or legal next of kin to help with decision-making.   Safety and Monitoring:             -- InVoluntary admission to inpatient psychiatric unit for safety, stabilization and treatment             -- Daily contact with patient to assess and evaluate symptoms and progress in treatment             -- Patient's case to be discussed in multi-disciplinary team meeting             -- Observation Level: q15 minute checks             -- Vital signs:  q12 hours             -- Precautions: suicide, elopement, and assault   2. Psychiatric Diagnoses and Treatment:              Continue Prolixin  to 10 mg twice daily. discontinued Zyprexa .  -- The risks/benefits/side-effects/alternatives to this medication were discussed in detail with the patient and time was given for questions. The patient consents to medication trial.                -- Metabolic profile and EKG monitoring obtained while on an atypical antipsychotic (BMI: Lipid Panel: HbgA1c: QTc:)              -- Encouraged patient to participate in unit milieu and in scheduled group therapies                            3. Medical Issues Being Addressed:    No urgent  medical needs noted  4. Discharge Planning:   -- Social work and case management to assist with discharge planning and identification of hospital follow-up needs prior to discharge  -- Estimated LOS: 3-4 days  Allyn Foil, [name] 05/23/2024, 1:44 PM

## 2024-05-23 NOTE — BHH Counselor (Signed)
 CSW received call from [name] who reports that at this time there is no updates on placement for pt but that she is working with pt's LME and his care coordination services, through [employer], to find placement for pt.   CSW informed care team  Lum Croft, MSW, [facility] 05/29/2024 3:33 PM

## 2024-05-23 NOTE — Group Note (Signed)
 Date:  05/23/2024 Time:  2:58 PM  Group Topic/Focus:  Managing Feelings:   The focus of this group is to identify what feelings patients have difficulty handling and develop a plan to handle them in a healthier way upon discharge.    Participation Level:  Did Not Attend   Benjamin [name] 05/23/2024, 2:58 PM

## 2024-05-23 NOTE — Plan of Care (Signed)
  Problem: Education: Goal: Knowledge of General Education information will improve Description: Including pain rating scale, medication(s)/side effects and non-pharmacologic comfort measures Outcome: Progressing   Problem: Health Behavior/Discharge Planning: Goal: Ability to manage health-related needs will improve Outcome: Progressing   Problem: Education: Goal: Knowledge of [place] General Education information/materials will improve Outcome: Progressing Goal: Emotional status will improve Outcome: Progressing Goal: Mental status will improve Outcome: Progressing

## 2024-05-23 NOTE — BHH Group Notes (Signed)
 Spirituality Group   Description: Participant directed exploration of values, beliefs and meaning   Following a brief framework of chaplain's role and ground rules of group behavior, participants are invited to share concerns or questions that engage spiritual life. Emphasis placed on common themes and shared experiences and ways to make meaning and clarify living into one's values.   Theory/Process/Goal: Utilize the theoretical framework of group therapy established by Celena Kite, Relational Cultural Theory and Rogerian approaches to facilitate relational empathy and use of the "here and now" to foster reflection, self-awareness, and sharing.   Observations: Benjamin [name] was present for first 0.5 of group but did not engage in the conversation. Unclear how much he passively engaged.  [name] L. [name] HERO.Div

## 2024-05-23 NOTE — Progress Notes (Signed)
   05/23/24 2200  Psych Admission Type (Psych Patients Only)  Admission Status Involuntary  Psychosocial Assessment  Patient Complaints None  Eye Contact Fair  Facial Expression Flat  Affect Flat  Speech Soft  Interaction Minimal;Isolative  Motor Activity Slow  Appearance/Hygiene In scrubs  Behavior Characteristics Cooperative  Mood Pleasant  Thought Process  Coherency WDL  Content WDL  Delusions None reported or observed  Perception WDL  Hallucination None reported or observed  Judgment Impaired  Confusion Mild  Danger to Self  Current suicidal ideation? Denies  Danger to Others  Danger to Others None reported or observed   Mood/Behavior:  Pleasant and cooperative.  Mild confusion.    Psych assessment: Denies SI/HI and AVH.     Interaction / Group attendance:  Present in the milieu for snack and group. Minimal interaction with peers and staff.     Medication/ PRNs: Compliant with scheduled medications. No PRNs given.   Pain: Denies   15 min checks in place for safety.

## 2024-05-23 NOTE — Progress Notes (Signed)
   05/22/24 2100  Psych Admission Type (Psych Patients Only)  Admission Status Involuntary  Psychosocial Assessment  Patient Complaints None  Eye Contact Fair  Facial Expression Flat  Affect Appropriate to circumstance  Speech Soft  Interaction Assertive  Motor Activity Slow  Appearance/Hygiene In scrubs  Behavior Characteristics Cooperative  Mood Pleasant  Thought Process  Coherency WDL  Content WDL  Delusions None reported or observed  Perception WDL  Hallucination None reported or observed  Judgment Impaired  Confusion Mild  Danger to Self  Current suicidal ideation? Denies

## 2024-05-23 NOTE — Plan of Care (Signed)
  Problem: Education: Goal: Knowledge of General Education information will improve Description: Including pain rating scale, medication(s)/side effects and non-pharmacologic comfort measures Outcome: Progressing   Problem: Health Behavior/Discharge Planning: Goal: Ability to manage health-related needs will improve Outcome: Progressing   Problem: Clinical Measurements: Goal: Ability to maintain clinical measurements within normal limits will improve Outcome: Progressing Goal: Will remain free from infection Outcome: Progressing Goal: Diagnostic test results will improve Outcome: Progressing Goal: Respiratory complications will improve Outcome: Progressing Goal: Cardiovascular complication will be avoided Outcome: Progressing   Problem: Activity: Goal: Risk for activity intolerance will decrease Outcome: Progressing   Problem: Nutrition: Goal: Adequate nutrition will be maintained Outcome: Progressing   Problem: Coping: Goal: Level of anxiety will decrease Outcome: Progressing   Problem: Elimination: Goal: Will not experience complications related to bowel motility Outcome: Progressing Goal: Will not experience complications related to urinary retention Outcome: Progressing   Problem: Pain Managment: Goal: General experience of comfort will improve and/or be controlled Outcome: Progressing   Problem: Safety: Goal: Ability to remain free from injury will improve Outcome: Progressing   Problem: Skin Integrity: Goal: Risk for impaired skin integrity will decrease Outcome: Progressing   Problem: Education: Goal: Knowledge of [place] [facility] Education information/materials will improve Outcome: Progressing Goal: Emotional status will improve Outcome: Progressing Goal: Mental status will improve Outcome: Progressing Goal: Verbalization of understanding the information provided will improve Outcome: Progressing   Problem: Activity: Goal: Interest or  engagement in activities will improve Outcome: Progressing Goal: Sleeping patterns will improve Outcome: Progressing   Problem: Coping: Goal: Ability to verbalize frustrations and anger appropriately will improve Outcome: Progressing Goal: Ability to demonstrate self-control will improve Outcome: Progressing   Problem: Health Behavior/Discharge Planning: Goal: Identification of resources available to assist in meeting health care needs will improve Outcome: Progressing Goal: Compliance with treatment plan for underlying cause of condition will improve Outcome: Progressing   Problem: Physical Regulation: Goal: Ability to maintain clinical measurements within normal limits will improve Outcome: Progressing   Problem: Safety: Goal: Periods of time without injury will increase Outcome: Progressing

## 2024-05-23 NOTE — Group Note (Signed)
 Date:  05/23/2024 Time:  8:33 PM  Group Topic/Focus:  Wrap-Up Group:   The focus of this group is to help patients review their daily goal of treatment and discuss progress on daily workbooks.    Participation Level:  Active  Participation Quality:  Appropriate  Affect:  Appropriate  Cognitive:  Appropriate  Insight: Appropriate  Engagement in Group:  Engaged  Modes of Intervention:  Discussion  Additional Comments:    Beatris ONEIDA Hasten 05/23/2024, 8:33 PM

## 2024-05-23 NOTE — Progress Notes (Signed)
   05/23/24 1200  Psych Admission Type (Psych Patients Only)  Admission Status Involuntary  Psychosocial Assessment  Patient Complaints None  Eye Contact Fair  Facial Expression Flat  Affect Flat  Speech Soft  Interaction Minimal;Isolative  Motor Activity Slow  Appearance/Hygiene In scrubs  Behavior Characteristics Cooperative  Mood Pleasant  Thought Process  Coherency WDL  Content WDL  Delusions None reported or observed  Perception WDL  Hallucination None reported or observed  Judgment Impaired  Confusion Mild  Danger to Self  Current suicidal ideation? Denies  Agreement Not to Harm Self Yes  Description of Agreement verbal  Danger to Others  Danger to Others None reported or observed

## 2024-05-23 NOTE — Plan of Care (Signed)

## 2024-05-24 DIAGNOSIS — G3184 Mild cognitive impairment, so stated: Secondary | ICD-10-CM | POA: Diagnosis not present

## 2024-05-24 DIAGNOSIS — F22 Delusional disorders: Secondary | ICD-10-CM | POA: Diagnosis not present

## 2024-05-24 NOTE — BH IP Treatment Plan (Signed)
 Interdisciplinary Treatment and Diagnostic Plan Update  05/24/2024 Time of Session: 3:00 PM  Benjamin [name] MRN: [id number]  Principal Diagnosis: Delusional disorder [facility])  Secondary Diagnoses: Principal Problem:   Delusional disorder (HCC)   Current Medications:  Current Facility-Administered Medications  Medication Dose Route Frequency Provider Last Rate Last Admin   acetaminophen  (TYLENOL ) tablet 650 mg  650 mg Oral Q6H PRN Coleman, Carolyn H, NP       alum & mag hydroxide-simeth (MAALOX/MYLANTA) 200-200-20 MG/5ML suspension 30 mL  30 mL Oral Q4H PRN Coleman, Carolyn H, NP       docusate sodium  (COLACE) capsule 100 mg  100 mg Oral Daily Jadapalle, Sree, MD   100 mg at 05/24/24 9250   fluPHENAZine  (PROLIXIN ) tablet 10 mg  10 mg Oral BID Shrivastava, Aryendra, MD   10 mg at 05/24/24 9250   Or   fluPHENAZine  (PROLIXIN ) injection 5 mg  5 mg Intramuscular BID Shrivastava, Aryendra, MD       magnesium  hydroxide (MILK OF MAGNESIA) suspension 30 mL  30 mL Oral Daily PRN Coleman, Carolyn H, NP       OLANZapine  (ZYPREXA ) injection 5 mg  5 mg Intramuscular TID PRN Mardy Elveria DEL, NP   5 mg at 04/12/24 2230   OLANZapine  zydis (ZYPREXA ) disintegrating tablet 5 mg  5 mg Oral TID PRN Coleman, Carolyn H, NP       polyethylene glycol (MIRALAX  / GLYCOLAX ) packet 17 g  17 g Oral Daily PRN Jadapalle, Sree, MD   17 g at 05/10/24 0830   PTA Medications: Medications Prior to Admission  Medication Sig Dispense Refill Last Dose/Taking   docusate sodium  (COLACE) 100 MG capsule Take 100 mg by mouth daily.      omega-3 acid ethyl esters (LOVAZA) 1 g capsule Take 1 g by mouth daily.       Patient Stressors: Traumatic event    Patient Strengths: Communication skills   Treatment Modalities: Medication Management, Group therapy, Case management,  1 to 1 session with clinician, Psychoeducation, Recreational therapy.   Physician Treatment Plan for Primary Diagnosis: Delusional disorder [facility]) Long Term  Goal(s): Improvement in symptoms so as ready for discharge   Short Term Goals: Ability to identify changes in lifestyle to reduce recurrence of condition will improve Ability to verbalize feelings will improve Ability to disclose and discuss suicidal ideas Ability to demonstrate self-control will improve Ability to identify and develop effective coping behaviors will improve  Medication Management: Evaluate patient's response, side effects, and tolerance of medication regimen.  Therapeutic Interventions: 1 to 1 sessions, Unit Group sessions and Medication administration.  Evaluation of Outcomes: Progressing  Physician Treatment Plan for Secondary Diagnosis: Principal Problem:   Delusional disorder (HCC)  Long Term Goal(s): Improvement in symptoms so as ready for discharge   Short Term Goals: Ability to identify changes in lifestyle to reduce recurrence of condition will improve Ability to verbalize feelings will improve Ability to disclose and discuss suicidal ideas Ability to demonstrate self-control will improve Ability to identify and develop effective coping behaviors will improve     Medication Management: Evaluate patient's response, side effects, and tolerance of medication regimen.  Therapeutic Interventions: 1 to 1 sessions, Unit Group sessions and Medication administration.  Evaluation of Outcomes: Progressing   RN Treatment Plan for Primary Diagnosis: Delusional disorder (HCC) Long Term Goal(s): Knowledge of disease and therapeutic regimen to maintain health will improve  Short Term Goals: Ability to remain free from injury will improve, Ability to verbalize frustration and anger appropriately  will improve, Ability to demonstrate self-control, Ability to participate in decision making will improve, Ability to verbalize feelings will improve, Ability to disclose and discuss suicidal ideas, Ability to identify and develop effective coping behaviors will improve, and  Compliance with prescribed medications will improve  Medication Management: RN will administer medications as ordered by provider, will assess and evaluate patient's response and provide education to patient for prescribed medication. RN will report any adverse and/or side effects to prescribing provider.  Therapeutic Interventions: 1 on 1 counseling sessions, Psychoeducation, Medication administration, Evaluate responses to treatment, Monitor vital signs and CBGs as ordered, Perform/monitor CIWA, COWS, AIMS and Fall Risk screenings as ordered, Perform wound care treatments as ordered.  Evaluation of Outcomes: Progressing   LCSW Treatment Plan for Primary Diagnosis: Delusional disorder [facility]) Long Term Goal(s): Safe transition to appropriate next level of care at discharge, Engage patient in therapeutic group addressing interpersonal concerns.  Short Term Goals: Engage patient in aftercare planning with referrals and resources, Increase social support, Increase ability to appropriately verbalize feelings, Increase emotional regulation, Facilitate acceptance of mental health diagnosis and concerns, Facilitate patient progression through stages of change regarding substance use diagnoses and concerns, Identify triggers associated with mental health/substance abuse issues, and Increase skills for wellness and recovery  Therapeutic Interventions: Assess for all discharge needs, 1 to 1 time with Social worker, Explore available resources and support systems, Assess for adequacy in community support network, Educate family and significant other(s) on suicide prevention, Complete Psychosocial Assessment, Interpersonal group therapy.  Evaluation of Outcomes: Progressing   Progress in Treatment: Attending groups: Yes. and No. Participating in groups: Yes. and No. Taking medication as prescribed: Yes. Toleration medication: Yes. Family/Significant other contact made: Yes, individual(s) contacted:   [name], borther, [phone] Patient understands diagnosis: No. Discussing patient identified problems/goals with staff: Yes. Medical problems stabilized or resolved: Yes. Denies suicidal/homicidal ideation: Yes. Issues/concerns per patient self-inventory: No. Other:    New problem(s) identified: No, Describe:  05/14/24 Update: None  05/24/24 Update: No changes at this time.    New Short Term/Long Term Goal(s): elimination of symptoms of psychosis, medication management for mood stabilization; elimination of SI thoughts; development of comprehensive mental wellness plan. Update 04/02/24: No changes at this time. Update 04/07/24: No changes at this time. Update 04/12/24: No changes at this time Update 04/18/24: No changes at this time  Update 04/23/24: No changes at this time. Update 04/28/2024: No changes at this time.  Update 05/04/2024: No changes at this time.  Update 05/09/2024:  No changes at this time. 05/14/24 Update: No changes at this time. 05/19/24 Update: No changes at this time. 05/24/24 Update: No changes at this time.    Discharge Plan or Barriers: CSW will assist with appropriate discharge planning  Update 04/02/24: No changes at this time. Update 04/07/24: No changes at this time.Update 04/12/24: No changes at this time Update 04/18/24: CSW submitted report to APS. Care team looking at guardianship for pt at this time  Update 04/23/24: No changes at this time. Update 04/28/2024: No changes at this time. Update 05/04/2024: No changes at this time.  Update 05/09/2024:  [facility] APS continues to search for placement. 05/14/24 Update: [facility] APS continues to look for placement. CSW to continue to assess. 05/19/24 Update: No changes at this time. 05/24/24 Update:DSS continues to look for placement at this time      Reason for Continuation of Hospitalization: Delusions  Medication stabilization   Estimated Length of Stay: 1 to 7 days Update  04/02/24: TBD. Update 04/07/24: TBD Update  04/12/24:TBD. Update 04/28/24:TBD Update 05/04/2024: TBD Update 05/09/2024:  TBD  05/14/24 Update: TBD. 05/19/24 Update: TBD. 05/24/24 Update: TBD Last 3 [place] Suicide Severity Risk Score: Flowsheet Row Admission (Current) from 03/27/2024 in [facility] ED from 03/26/2024 in [facility] at [facility] ED from 03/25/2024 in [name]  C-SSRS RISK CATEGORY No Risk No Risk No Risk    Last [facility] 2/9 Scores:     No data to display          Scribe for Treatment Team: [name], ISRAEL 05/24/2024 3:12 PM

## 2024-05-24 NOTE — Group Note (Signed)
 Date:  05/24/2024 Time:  1:12 PM  Group Topic/Focus:  Building Self Esteem:   The Focus of this group is helping patients become aware of the effects of self-esteem on their lives, the things they and others do that enhance or undermine their self-esteem, seeing the relationship between their level of self-esteem and the choices they make and learning ways to enhance self-esteem. We also discussed positive affirmations to tell ourselves when we're feeling down on ourselves, we did positive affirmation bingo after our discussion.    Participation Level:  Active  Participation Quality:  Appropriate  Affect:  Appropriate  Cognitive:  Appropriate  Insight: Appropriate  Engagement in Group:  Engaged  Modes of Intervention:  Activity  Additional Comments:    Leigh VEAR Pais 05/24/2024, 1:12 PM

## 2024-05-24 NOTE — Group Note (Signed)
 [facility] LCSW Group Therapy Note   Group Date: 05/24/2024 Start Time: 1330 End Time: 1400   Type of Therapy/Topic:  Group Therapy:  Emotion Regulation  Participation Level:  Did Not Attend   Mood:  Description of Group:    The purpose of this group is to assist patients in learning to regulate negative emotions and experience positive emotions. Patients will be guided to discuss ways in which they have been vulnerable to their negative emotions. These vulnerabilities will be juxtaposed with experiences of positive emotions or situations, and patients challenged to use positive emotions to combat negative ones. Special emphasis will be placed on coping with negative emotions in conflict situations, and patients will process healthy conflict resolution skills.  Therapeutic Goals: Patient will identify two positive emotions or experiences to reflect on in order to balance out negative emotions:  Patient will label two or more emotions that they find the most difficult to experience:  Patient will be able to demonstrate positive conflict resolution skills through discussion or role plays:   Summary of Patient Progress:   Pt came into group late, pt was not noted writing a letter     Therapeutic Modalities:   Cognitive Behavioral Therapy Feelings Identification Dialectical Behavioral Therapy   [name], LCSWA

## 2024-05-24 NOTE — Progress Notes (Signed)
 Mood/Behavior:  Flat affect.  Pleasant and cooperative. Mild confusion.    Psych assessment:  Denies SI/HI and AVH.   Interaction / Group attendance:  Isolates to room with the exception of meals.  Minimal interaction with peers and staff.   Medication/ PRNs:  Compliant with medications. No PRNs needed.  Pain: Denies  15 min checks in place for safety.

## 2024-05-24 NOTE — Group Note (Signed)
 Recreation Therapy Group Note   Group Topic:General Recreation  Group Date: 05/24/2024 Start Time: 1100 End Time: 1135 Facilitators: Celestia Jeoffrey BRAVO, LRT, CTRS Location: Courtyard  Group Description: Outdoor Recreation. Patients had the option to play corn hole, ring toss, UNO, or listening to music while outside in the courtyard getting fresh air and sunlight. Patients helped water and prune the raised garden beds. LRT and patients discussed things that they enjoy doing in their free time outside of the hospital. LRT encouraged patients to drink water after being active and getting their heart rate up.   Goal Area(s) Addressed: Patient will identify leisure interests.  Patient will practice healthy decision making. Patient will engage in recreation activity.   Affect/Mood: N/A   Participation Level: Did not attend    Clinical Observations/Individualized Feedback: Patient did not attend group.   Plan: Continue to engage patient in RT group sessions 2-3x/week.   Jeoffrey BRAVO Celestia, LRT, CTRS 05/24/2024 1:40 PM

## 2024-05-24 NOTE — Progress Notes (Signed)
 [facility] MD Progress Note  05/24/2024 12:07 PM Benjamin [name]  MRN:  [id number] Benjamin [name] is a 78 year old male who presents to the inpatient geriatric psych unit after jumping from a two story building. Patient was originally seen at [facility] who referred him to the ED who admitted him here on 03/27/2024. Patient reports that people from the [place] gang were trying to kill him and the only escape was through the bedroom window on the second floor. He currently lives in the house with [name] and his family and has been living with them for 3-4 years without any problems. On 03/19/2024 he went to the doctor and when he got back home the family wouldn't let him leave. He states that after he realized the gang was going to kill him he barricaded the door with his bed and jumped out the window. When he landed he ran away until one of his neighbors found him and called 911.   Subjective: Chart is reviewed and discussed with the treatment team.  On interview patient is noted to be sitting in his bed.  He offers no complaints.  Provider educated about APS working on guardianship and placement.  Patient expressed his understanding.  He denies SI/HI/plan and denies hallucinations.  Per nursing report patient has no behavioral problems and remains compliant with medications.    Capacity :Able to understand medical problem Patient is unable to display any understanding of his medical problems or the current presentation leading up to admission Able to understand proposed treatment  Patient is unable to understand the proposed treatment with antipsychotics Patient remains paranoid and has cognitive impairment as he is unable to discuss his discharge planning.  Given his paranoia and delay in executive function patient is unable to make decisions regarding the treatment and disposition.    Sleep: Fair  Appetite:  Fair  Past Psychiatric History: see h&P  Family History:  Family  History  Problem Relation Age of Onset   Colon cancer Neg Hx    Colon polyps Neg Hx    Stomach cancer Neg Hx    Esophageal cancer Neg Hx    Social History:  Social History   Substance and Sexual Activity  Alcohol Use No     Social History   Substance and Sexual Activity  Drug Use No    Social History   Socioeconomic History   Marital status: Single    Spouse name: Not on file   Number of children: Not on file   Years of education: Not on file   Highest education level: Not on file  Occupational History   Not on file  Tobacco Use   Smoking status: Former    Types: Cigarettes   Smokeless tobacco: Never  Vaping Use   Vaping status: Never Used  Substance and Sexual Activity   Alcohol use: No   Drug use: No   Sexual activity: Not on file  Other Topics Concern   Not on file  Social History Narrative   Not on file   Social Drivers of Health   Financial Resource Strain: Not on file  Food Insecurity: No Food Insecurity (03/27/2024)   Hunger Vital Sign    Worried About Running Out of Food in the Last Year: Never true    Ran Out of Food in the Last Year: Never true  Transportation Needs: No Transportation Needs (03/27/2024)   PRAPARE - Administrator, Civil Service (Medical): No    Lack of Transportation (  Non-Medical): No  Physical Activity: Not on file  Stress: Not on file  Social Connections: Moderately Integrated (03/27/2024)   Social Connection and Isolation Panel    Frequency of Communication with Friends and Family: Twice a week    Frequency of Social Gatherings with Friends and Family: Once a week    Attends Religious Services: 1 to 4 times per year    Active Member of [employer] or Organizations: No    Attends Engineer, structural: 1 to 4 times per year    Marital Status: Divorced   Past Medical History:  Past Medical History:  Diagnosis Date   Allergy    Arthritis    back   Prostate cancer (HCC) dx'd 2010   surg only    Past Surgical  History:  Procedure Laterality Date   COLONOSCOPY     EYE SURGERY     PROSTATE SURGERY      Current Medications: Current Facility-Administered Medications  Medication Dose Route Frequency Provider Last Rate Last Admin   acetaminophen  (TYLENOL ) tablet 650 mg  650 mg Oral Q6H PRN Coleman, Carolyn H, NP       alum & mag hydroxide-simeth (MAALOX/MYLANTA) 200-200-20 MG/5ML suspension 30 mL  30 mL Oral Q4H PRN Coleman, Carolyn H, NP       docusate sodium  (COLACE) capsule 100 mg  100 mg Oral Daily Terriann Difonzo, MD   100 mg at 05/24/24 9250   fluPHENAZine  (PROLIXIN ) tablet 10 mg  10 mg Oral BID Shrivastava, Aryendra, MD   10 mg at 05/24/24 9250   Or   fluPHENAZine  (PROLIXIN ) injection 5 mg  5 mg Intramuscular BID Shrivastava, Aryendra, MD       magnesium  hydroxide (MILK OF MAGNESIA) suspension 30 mL  30 mL Oral Daily PRN Mardy Elveria DEL, NP       OLANZapine  (ZYPREXA ) injection 5 mg  5 mg Intramuscular TID PRN Mardy Elveria DEL, NP   5 mg at 04/12/24 2230   OLANZapine  zydis (ZYPREXA ) disintegrating tablet 5 mg  5 mg Oral TID PRN Mardy Elveria DEL, NP       polyethylene glycol (MIRALAX  / GLYCOLAX ) packet 17 g  17 g Oral Daily PRN Donnelly Mellow, MD   17 g at 05/10/24 0830    Lab Results:  No results found for this or any previous visit (from the past 48 hours).    Blood Alcohol level:  Lab Results  Component Value Date   [name] <15 03/26/2024    Metabolic Disorder Labs: Lab Results  Component Value Date   HGBA1C 5.6 04/02/2024   MPG 114 04/02/2024   No results found for: PROLACTIN Lab Results  Component Value Date   CHOL 146 04/02/2024   TRIG 37 04/02/2024   HDL 43 04/02/2024   CHOLHDL 3.4 04/02/2024   VLDL 7 04/02/2024   LDLCALC 96 04/02/2024    Physical Findings: AIMS:  , ,  ,  ,    CIWA:    COWS:      Psychiatric Specialty Exam:  Presentation  General Appearance:  Appropriate for Environment; Bizarre  Eye Contact: Fair  Speech: Clear and  Coherent  Speech Volume: Normal    Mood and Affect  Mood: Anxious  Affect: Congruent   Thought Process  Thought Processes: Disorganized  Descriptions of Associations:Intact  Orientation:Full (Time, Place and Person)  Thought Content: Paranoia at baseline Hallucinations: Denies  Ideas of Reference: Paranoia at baseline Suicidal Thoughts: Denies  Homicidal Thoughts: Denies   Sensorium  Memory: Immediate  Fair; Recent Fair; Remote Fair  Judgment: Impaired  Insight: Community education officer  Concentration: Fair  Attention Span: Fair  Recall: [employer] of Knowledge: Fair  Language: Fair   Psychomotor Activity  Psychomotor Activity: No data recorded  Musculoskeletal: Strength & Muscle Tone: within normal limits Gait & Station: normal Assets  Assets: Manufacturing systems engineer; Resilience    Physical Exam: Physical Exam Vitals and nursing note reviewed.    ROS Blood pressure 118/82, pulse 64, temperature 97.9 F (36.6 C), resp. rate 16, height 6' (1.829 m), weight 100 kg, SpO2 100%. Body mass index is 29.91 kg/m.  Diagnosis: Principal Problem:   Delusional disorder [facility])  Major Neurocognitive disorder  Clinical Decision Making: Patient currently admitted after jumping off a two-story building in the context of possible delusions as reported as the people in the house he lived for 4 years trying to kill him.  Patient needs to be monitored closely for ongoing psychosis and paranoid delusions.  Given patient's confusion and chronic paranoia, no family support, APS report has been made and it got screened in for further evaluation by APS.   Differential diagnosis include: unspecified psychosis, delusion disorder, dementia, schizophrenia/schizoaffective disorder   Treatment Plan Summary: Requested the social work team to reach out to APS for safety concerns in the community as patient has chronic paranoia and confusion and is currently  homeless patient remains confused and paranoid and spite of being compliant with medications.  Patient displays cognitive delay and deficits in executive function like planning, organizing and processing information.  At this point patient lacks capacity to make medical decisions.  We recommend APS referral for possible legal guardianship as he has no family or legal next of kin to help with decision-making.   Safety and Monitoring:             -- InVoluntary admission to inpatient psychiatric unit for safety, stabilization and treatment             -- Daily contact with patient to assess and evaluate symptoms and progress in treatment             -- Patient's case to be discussed in multi-disciplinary team meeting             -- Observation Level: q15 minute checks             -- Vital signs:  q12 hours             -- Precautions: suicide, elopement, and assault   2. Psychiatric Diagnoses and Treatment:              Continue Prolixin  to 10 mg twice daily. discontinued Zyprexa .  -- The risks/benefits/side-effects/alternatives to this medication were discussed in detail with the patient and time was given for questions. The patient consents to medication trial.                -- Metabolic profile and EKG monitoring obtained while on an atypical antipsychotic (BMI: Lipid Panel: HbgA1c: QTc:)              -- Encouraged patient to participate in unit milieu and in scheduled group therapies                            3. Medical Issues Being Addressed:    No urgent medical needs noted  4. Discharge Planning:   -- Social work and case management to assist with discharge  planning and identification of hospital follow-up needs prior to discharge  -- Estimated LOS: 3-4 days  Allyn Foil, MD 05/24/2024, 12:07 PM

## 2024-05-24 NOTE — Progress Notes (Signed)
   05/24/24 2100  Psych Admission Type (Psych Patients Only)  Admission Status Involuntary  Psychosocial Assessment  Patient Complaints None  Eye Contact Brief  Facial Expression Flat  Affect Flat  Speech Soft  Interaction Minimal  Motor Activity Slow  Appearance/Hygiene In scrubs  Behavior Characteristics Cooperative  Mood Pleasant  Thought Process  Coherency WDL  Content WDL  Delusions None reported or observed  Perception WDL  Hallucination None reported or observed  Judgment Impaired  Confusion Mild  Danger to Self  Current suicidal ideation? Denies  Danger to Others  Danger to Others None reported or observed   Mood/Behavior:  Pleasant and cooperative.  Mild confusion.    Psych assessment: Denies SI/HI and AVH.     Interaction / Group attendance:  Present in the milieu for snack. Minimal interaction with peers and staff.     Medication/ PRNs: Compliant with scheduled medications. No PRNs given.   Pain: Denies   15 min checks in place for safety.

## 2024-05-24 NOTE — Plan of Care (Signed)
  Problem: Nutrition: Goal: Adequate nutrition will be maintained Outcome: Progressing   Problem: Coping: Goal: Level of anxiety will decrease Outcome: Progressing   Problem: Safety: Goal: Ability to remain free from injury will improve Outcome: Progressing   Problem: Education: Goal: Emotional status will improve Outcome: Progressing

## 2024-05-24 NOTE — Plan of Care (Signed)
  Problem: Education: Goal: Knowledge of General Education information will improve Description: Including pain rating scale, medication(s)/side effects and non-pharmacologic comfort measures Outcome: Progressing   Problem: Health Behavior/Discharge Planning: Goal: Ability to manage health-related needs will improve Outcome: Progressing   Problem: Clinical Measurements: Goal: Ability to maintain clinical measurements within normal limits will improve Outcome: Progressing Goal: Will remain free from infection Outcome: Progressing Goal: Diagnostic test results will improve Outcome: Progressing Goal: Respiratory complications will improve Outcome: Progressing Goal: Cardiovascular complication will be avoided Outcome: Progressing   Problem: Activity: Goal: Risk for activity intolerance will decrease Outcome: Progressing   Problem: Nutrition: Goal: Adequate nutrition will be maintained Outcome: Progressing   Problem: Coping: Goal: Level of anxiety will decrease Outcome: Progressing   Problem: Elimination: Goal: Will not experience complications related to bowel motility Outcome: Progressing Goal: Will not experience complications related to urinary retention Outcome: Progressing   Problem: Pain Managment: Goal: General experience of comfort will improve and/or be controlled Outcome: Progressing   Problem: Safety: Goal: Ability to remain free from injury will improve Outcome: Progressing   Problem: Skin Integrity: Goal: Risk for impaired skin integrity will decrease Outcome: Progressing   Problem: Education: Goal: Knowledge of [place] [facility] Education information/materials will improve Outcome: Progressing Goal: Emotional status will improve Outcome: Progressing Goal: Mental status will improve Outcome: Progressing Goal: Verbalization of understanding the information provided will improve Outcome: Progressing   Problem: Activity: Goal: Interest or  engagement in activities will improve Outcome: Progressing Goal: Sleeping patterns will improve Outcome: Progressing   Problem: Coping: Goal: Ability to verbalize frustrations and anger appropriately will improve Outcome: Progressing Goal: Ability to demonstrate self-control will improve Outcome: Progressing   Problem: Health Behavior/Discharge Planning: Goal: Identification of resources available to assist in meeting health care needs will improve Outcome: Progressing Goal: Compliance with treatment plan for underlying cause of condition will improve Outcome: Progressing   Problem: Physical Regulation: Goal: Ability to maintain clinical measurements within normal limits will improve Outcome: Progressing   Problem: Safety: Goal: Periods of time without injury will increase Outcome: Progressing

## 2024-05-25 DIAGNOSIS — G3184 Mild cognitive impairment, so stated: Secondary | ICD-10-CM | POA: Diagnosis not present

## 2024-05-25 DIAGNOSIS — F22 Delusional disorders: Secondary | ICD-10-CM | POA: Diagnosis not present

## 2024-05-25 NOTE — Plan of Care (Signed)
   Problem: Activity: Goal: Interest or engagement in activities will improve Outcome: Progressing   Problem: Health Behavior/Discharge Planning: Goal: Compliance with treatment plan for underlying cause of condition will improve Outcome: Progressing   Problem: Safety: Goal: Periods of time without injury will increase Outcome: Progressing

## 2024-05-25 NOTE — Progress Notes (Signed)
 [facility] MD Progress Note  05/25/2024 3:45 PM Twan Harkin  MRN:  [id number] Mikal Blasdell is a 78 year old male who presents to the inpatient geriatric psych unit after jumping from a two story building. Patient was originally seen at [facility] who referred him to the ED who admitted him here on 03/27/2024. Patient reports that people from the [place] gang were trying to kill him and the only escape was through the bedroom window on the second floor. He currently lives in the house with [name] and his family and has been living with them for 3-4 years without any problems. On 03/19/2024 he went to the doctor and when he got back home the family wouldn't let him leave. He states that after he realized the gang was going to kill him he barricaded the door with his bed and jumped out the window. When he landed he ran away until one of his neighbors found him and called 911.   Subjective: Chart is reviewed and discussed with the treatment team.  Patient is noted to be resting in his room.  When patient was asked to go outside for groups he reports feeling very cold and bradycardiac it.  He is not endorsing SI/HI/plan and is not endorsing hallucinations.  Per nursing patient is redirectable and is participating in groups.  He is attending to his ADLs.  He remains confused and APS took over the guardianship and is working on placement.    Capacity :Able to understand medical problem Patient is unable to display any understanding of his medical problems or the current presentation leading up to admission Able to understand proposed treatment  Patient is unable to understand the proposed treatment with antipsychotics Patient remains paranoid and has cognitive impairment as he is unable to discuss his discharge planning.  Given his paranoia and delay in executive function patient is unable to make decisions regarding the treatment and disposition.    Sleep: Fair  Appetite:  Fair  Past  Psychiatric History: see h&P  Family History:  Family History  Problem Relation Age of Onset   Colon cancer Neg Hx    Colon polyps Neg Hx    Stomach cancer Neg Hx    Esophageal cancer Neg Hx    Social History:  Social History   Substance and Sexual Activity  Alcohol Use No     Social History   Substance and Sexual Activity  Drug Use No    Social History   Socioeconomic History   Marital status: Single    Spouse name: Not on file   Number of children: Not on file   Years of education: Not on file   Highest education level: Not on file  Occupational History   Not on file  Tobacco Use   Smoking status: Former    Types: Cigarettes   Smokeless tobacco: Never  Vaping Use   Vaping status: Never Used  Substance and Sexual Activity   Alcohol use: No   Drug use: No   Sexual activity: Not on file  Other Topics Concern   Not on file  Social History Narrative   Not on file   Social Drivers of Health   Financial Resource Strain: Not on file  Food Insecurity: No Food Insecurity (03/27/2024)   Hunger Vital Sign    Worried About Running Out of Food in the Last Year: Never true    Ran Out of Food in the Last Year: Never true  Transportation Needs: No Transportation Needs (03/27/2024)  PRAPARE - Administrator, Civil Service (Medical): No    Lack of Transportation (Non-Medical): No  Physical Activity: Not on file  Stress: Not on file  Social Connections: Moderately Integrated (03/27/2024)   Social Connection and Isolation Panel    Frequency of Communication with Friends and Family: Twice a week    Frequency of Social Gatherings with Friends and Family: Once a week    Attends Religious Services: 1 to 4 times per year    Active Member of [employer] or Organizations: No    Attends Engineer, structural: 1 to 4 times per year    Marital Status: Divorced   Past Medical History:  Past Medical History:  Diagnosis Date   Allergy    Arthritis    back   Prostate  cancer (HCC) dx'd 2010   surg only    Past Surgical History:  Procedure Laterality Date   COLONOSCOPY     EYE SURGERY     PROSTATE SURGERY      Current Medications: Current Facility-Administered Medications  Medication Dose Route Frequency Provider Last Rate Last Admin   acetaminophen  (TYLENOL ) tablet 650 mg  650 mg Oral Q6H PRN Coleman, Carolyn H, NP       alum & mag hydroxide-simeth (MAALOX/MYLANTA) 200-200-20 MG/5ML suspension 30 mL  30 mL Oral Q4H PRN Coleman, Carolyn H, NP       docusate sodium  (COLACE) capsule 100 mg  100 mg Oral Daily Bodey Frizell, MD   100 mg at 05/25/24 0848   fluPHENAZine  (PROLIXIN ) tablet 10 mg  10 mg Oral BID Shrivastava, Aryendra, MD   10 mg at 05/25/24 0848   Or   fluPHENAZine  (PROLIXIN ) injection 5 mg  5 mg Intramuscular BID Shrivastava, Aryendra, MD       magnesium  hydroxide (MILK OF MAGNESIA) suspension 30 mL  30 mL Oral Daily PRN Mardy Elveria DEL, NP       OLANZapine  (ZYPREXA ) injection 5 mg  5 mg Intramuscular TID PRN Mardy Elveria DEL, NP   5 mg at 04/12/24 2230   OLANZapine  zydis (ZYPREXA ) disintegrating tablet 5 mg  5 mg Oral TID PRN Mardy Elveria DEL, NP       polyethylene glycol (MIRALAX  / GLYCOLAX ) packet 17 g  17 g Oral Daily PRN Donnelly Mellow, MD   17 g at 05/10/24 0830    Lab Results:  No results found for this or any previous visit (from the past 48 hours).    Blood Alcohol level:  Lab Results  Component Value Date   [facility] <15 03/26/2024    Metabolic Disorder Labs: Lab Results  Component Value Date   HGBA1C 5.6 04/02/2024   MPG 114 04/02/2024   No results found for: PROLACTIN Lab Results  Component Value Date   CHOL 146 04/02/2024   TRIG 37 04/02/2024   HDL 43 04/02/2024   CHOLHDL 3.4 04/02/2024   VLDL 7 04/02/2024   LDLCALC 96 04/02/2024    Physical Findings: AIMS:  , ,  ,  ,    CIWA:    COWS:      Psychiatric Specialty Exam:  Presentation  General Appearance:  Appropriate for Environment;  Bizarre  Eye Contact: Fair  Speech: Clear and Coherent  Speech Volume: Normal    Mood and Affect  Mood: Anxious  Affect: Congruent   Thought Process  Thought Processes: Disorganized  Descriptions of Associations:Intact  Orientation:Full (Time, Place and Person)  Thought Content: Paranoia at baseline Hallucinations: Denies  Ideas of  Reference: Paranoia at baseline Suicidal Thoughts: Denies  Homicidal Thoughts: Denies   Sensorium  Memory: Immediate Fair; Recent Fair; Remote Fair  Judgment: Impaired  Insight: Shallow   Executive Functions  Concentration: Fair  Attention Span: Fair  Recall: [employer] of Knowledge: Fair  Language: Fair   Psychomotor Activity  Psychomotor Activity: No data recorded  Musculoskeletal: Strength & Muscle Tone: within normal limits Gait & Station: normal Assets  Assets: Manufacturing systems engineer; Resilience    Physical Exam: Physical Exam Vitals and nursing note reviewed.    ROS Blood pressure 101/69, pulse 67, temperature 97.8 F (36.6 C), resp. rate 18, height 6' (1.829 m), weight 100 kg, SpO2 99%. Body mass index is 29.91 kg/m.  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  Patient needs to be monitored closely for ongoing psychosis and paranoid delusions.  Given patient's confusion and chronic paranoia, no family support, APS report has been made and it got screened in for further evaluation by APS.   Differential diagnosis include: unspecified psychosis, delusion disorder, dementia, schizophrenia/schizoaffective disorder   Treatment Plan Summary: Requested the social work team to reach out to APS for safety concerns in the community as patient has  chronic paranoia and confusion and is currently homeless patient remains confused and paranoid and spite of being compliant with medications.  Patient displays cognitive delay and deficits in executive function like planning, organizing and processing information.  At this point patient lacks capacity to make medical decisions.  We recommend APS referral for possible legal guardianship as he has no family or legal next of kin to help with decision-making.   Safety and Monitoring:             -- InVoluntary admission to inpatient psychiatric unit for safety, stabilization and treatment             -- Daily contact with patient to assess and evaluate symptoms and progress in treatment             -- Patient's case to be discussed in multi-disciplinary team meeting             -- Observation Level: q15 minute checks             -- Vital signs:  q12 hours             -- Precautions: suicide, elopement, and assault   2. Psychiatric Diagnoses and Treatment:              Continue Prolixin  to 10 mg twice daily. discontinued Zyprexa .  -- The risks/benefits/side-effects/alternatives to this medication were discussed in detail with the patient and time was given for questions. The patient consents to medication trial.                -- Metabolic profile and EKG monitoring obtained while on an atypical antipsychotic (BMI: Lipid Panel: HbgA1c: QTc:)              -- Encouraged patient to participate in unit milieu and in scheduled group therapies                            3. Medical Issues Being Addressed:    No urgent medical needs  noted  4. Discharge Planning:   -- Social work and case management to assist with discharge planning and identification of hospital follow-up needs prior to discharge  -- Estimated LOS: 3-4 days  Nikki Rusnak, MD 05/25/2024, 3:45 PM

## 2024-05-25 NOTE — Group Note (Signed)
 Date:  05/25/2024 Time:  11:45 PM  Group Topic/Focus:  Wrap-Up Group:   The focus of this group is to help patients review their daily goal of treatment and discuss progress on daily workbooks.    Participation Level:  Active  Participation Quality:  Appropriate  Affect:  Appropriate  Cognitive:  Alert  Insight: Appropriate  Engagement in Group:  Engaged  Modes of Intervention:  Discussion  Additional Comments:    Benjamin [name] 05/25/2024, 11:45 PM

## 2024-05-25 NOTE — Group Note (Signed)
 Recreation Therapy Group Note   Group Topic:Coping Skills  Group Date: 05/25/2024 Start Time: 1500 End Time: 1600 Facilitators: Celestia Jeoffrey BRAVO, LRT, CTRS Location: Courtyard  Group Description: Music. Patients encouraged to name their favorite song(s) for LRT to play song through speaker for group to hear, while in the courtyard getting fresh air and sunlight. Patients educated on the definition of leisure and the importance of having different leisure interests outside of the hospital. Group discussed how leisure activities can often be used as Pharmacologist and that listening to music and being outside are examples.    Goal Area(s) Addressed:  Patient will identify a current leisure interest.  Patient will practice making a positive decision. Patient will have the opportunity to try a new leisure activity.   Affect/Mood: N/A   Participation Level: Did not attend    Clinical Observations/Individualized Feedback: Patient did not attend group.   Plan: Continue to engage patient in RT group sessions 2-3x/week.   Jeoffrey BRAVO Celestia, LRT, CTRS 05/25/2024 5:20 PM

## 2024-05-25 NOTE — Progress Notes (Signed)
 Mood/Behavior:  Pleasant and cooperative.     Psych assessment: Denies SI/HI and AVH.   Interaction / Group attendance:  Present in the milieu.  Observed to be interacting with peers this afternoon.   Medication/ PRNs:  Compliant with medications.  No PRNs needed.  Pain: Denies  15 min checks in place for safety.

## 2024-05-26 DIAGNOSIS — F0390 Unspecified dementia without behavioral disturbance: Secondary | ICD-10-CM

## 2024-05-26 DIAGNOSIS — F22 Delusional disorders: Secondary | ICD-10-CM | POA: Diagnosis not present

## 2024-05-26 NOTE — Progress Notes (Signed)
 HH Psychiatry Progress Note  Date/Time: 05/26/2024 3:45 PM Patient: Lynard Postlewait, 78 year old male MRN: [id number]    Identifying Information / Reason for Admission  Mr. Wiegel is a 78 year old male admitted to the inpatient geriatric psychiatry unit following a fall from a two-story building in the context of acute paranoia. He reported that individuals from the [place] gang were attempting to kill him and that he escaped through a bedroom window to save his life. He had been living with the "[name]" family for several years without prior incident. Adult Protective Services (APS) has since assumed guardianship and is assisting with placement planning.  Subjective  Chart and nursing notes reviewed. Patient seen and evaluated. He remains calm, cooperative, and resting in his room.He denies suicidal or homicidal ideation, intent, or plan. He denies hallucinations. Staff report that he is redirectable, attending to ADLs, and participating intermittently in group activities. No behavioral disturbances or aggression observed.  He continues to exhibit cognitive impairment with poor short-term memory, impaired reasoning, and persistent paranoid ideation. APS remains involved and is coordinating long-term placement.  Capacity Assessment  Understanding of medical problem: Unable to articulate awareness of his medical or psychiatric condition.  Understanding of proposed treatment: Unable to demonstrate comprehension of medication purpose or need.  Appreciation and reasoning: Lacks ability to weigh treatment benefits versus risks due to cognitive decline and paranoia. Conclusion: Patient lacks capacity to make informed medical or treatment decisions at this time.  Mental Status Examination  Appearance: Well-nourished elderly male, appropriately dressed for setting. Behavior: Calm, cooperative, redirectable. Speech: Clear, coherent, normal volume. Mood: "Okay." Affect: Constricted but  congruent. Thought Process: Disorganized; impaired coherence. Thought Content: Paranoid delusions at baseline; denies auditory/visual hallucinations. SI/HI: Denied. Orientation: Oriented to person, partially to place; disoriented to time/situation. Cognition: Impaired short-term memory and executive function. Insight: Poor. Judgment: Poor.  Sleep: Fair Appetite: Fair Vital Signs  BP 101/69  HR 67  Temp 97.8 F  RR 18  SpO? 99%  BMI 29.9 kg/m  Pertinent Labs and Studies  HbA1c: 5.6 (04/02/24)  Lipid Panel: Chol 146, HDL 43, LDL 96, TG 37 (04/02/24)  Blood Alcohol: <15 (03/26/24)  No new labs in past 48 hours.  Active Diagnoses  Delusional Disorder (HCC)  Major Neurocognitive Disorder (Dementia)  Rule out unspecified psychosis, schizophrenia, schizoaffective disorder  Current Medications  Fluphenazine  (Prolixin ) 10 mg PO BID (IM backup 5 mg BID PRN if non-compliant)  Docusate sodium  100 mg PO daily  PRN Medications: Tylenol , Maalox, Milk of Magnesia, Miralax , Zyprexa  (oral or IM for agitation)  Clinical Assessment / Decision Making  Mr. Rodman continues to exhibit significant cognitive and functional impairment consistent with dementia and chronic paranoid delusions. He lacks decisional capacity and remains unable to formulate a discharge plan. No acute agitation or suicidal behavior noted. He is psychiatrically and medically stable but requires structured supervision and long-term placement for safety and medication adherence.  Differential: Delusional disorder vs. psychosis secondary to major neurocognitive disorder.  Treatment Plan  Safety / Monitoring  Continue involuntary inpatient admission for stabilization and supervision.  Observation: q15-minute safety checks.  Precautions: Suicide, elopement, and assault.  Vitals: q12 hours.  Psychiatric Management  Continue Prolixin  10 mg PO BID; IM option if non-compliant.  Discontinue Zyprexa  due to  redundancy.  Continue supportive psychotherapy and milieu engagement as tolerated.  Risks, benefits, and alternatives discussed; informed consent limited due to incapacity--treatment proceeding under guardianship oversight.  Medical Management  No acute medical concerns. Continue bowel regimen and PRNs as needed.  Encourage  hydration, nutrition, and physical activity as tolerated.  Discharge / Disposition  APS to finalize long-term placement.  Social work and case management coordinating discharge planning.  Estimated length of stay: 3-4 days pending placement.  Summary Impression  78 year old male with major neurocognitive disorder and delusional disorder, currently stable on medication but continues to demonstrate poor insight, impaired cognition, and paranoia. No aggression or suicidal behavior observed. Lacks capacity for medical decision-making; APS remains primary decision-maker. Awaiting safe placement.  Demetra Moya 10.4.2025 11AM

## 2024-05-26 NOTE — Progress Notes (Signed)
   05/26/24 0923  Psych Admission Type (Psych Patients Only)  Admission Status Involuntary  Psychosocial Assessment  Patient Complaints None  Eye Contact Fair  Facial Expression Flat  Affect Flat  Speech Logical/coherent  Interaction Minimal  Motor Activity Slow  Appearance/Hygiene In scrubs  Behavior Characteristics Cooperative  Mood Pleasant  Thought Process  Coherency WDL  Content WDL  Delusions None reported or observed  Perception WDL  Hallucination None reported or observed  Judgment Impaired  Confusion Mild  Danger to Self  Current suicidal ideation? Denies  Agreement Not to Harm Self Yes  Description of Agreement verbal  Danger to Others  Danger to Others None reported or observed

## 2024-05-26 NOTE — Group Note (Signed)
 Date:  05/26/2024 Time:  9:47 PM  Group Topic/Focus:  Making Healthy Choices:   The focus of this group is to help patients identify negative/unhealthy choices they were using prior to admission and identify positive/healthier coping strategies to replace them upon discharge. Self Care:   The focus of this group is to help patients understand the importance of self-care in order to improve or restore emotional, physical, spiritual, interpersonal, and financial health.    Participation Level:  Active  Participation Quality:  Appropriate and Attentive  Affect:  Appropriate  Cognitive:  Alert, Appropriate, and Oriented  Insight: Appropriate and Good  Engagement in Group:  Engaged  Modes of Intervention:  Discussion and Support  Additional Comments:  N/A  Benjamin [name] 05/26/2024, 9:47 PM

## 2024-05-26 NOTE — Plan of Care (Signed)
  Problem: Safety: Goal: Ability to remain free from injury will improve Outcome: Progressing   Problem: Health Behavior/Discharge Planning: Goal: Compliance with treatment plan for underlying cause of condition will improve Outcome: Progressing   Problem: Safety: Goal: Periods of time without injury will increase Outcome: Progressing   

## 2024-05-26 NOTE — Progress Notes (Signed)
(  Sleep Hours) - Estimated Sleeping Duration (Last 24 Hours): 8.50-10.25 hours (Any PRNs that were needed, meds refused, or side effects to meds)- N/A (Any disturbances and when (visitation, over night)-N/A (Concerns raised by the patient)- N/A (SI/HI/AVH)- DENIES   05/25/24 2041  Psych Admission Type (Psych Patients Only)  Admission Status Involuntary  Psychosocial Assessment  Patient Complaints None  Eye Contact Brief  Facial Expression Flat  Affect Flat  Speech Soft  Interaction Minimal  Motor Activity Slow  Appearance/Hygiene In scrubs  Behavior Characteristics Cooperative  Mood Pleasant  Aggressive Behavior  Effect No apparent injury  Thought Process  Coherency WDL  Content WDL  Delusions None reported or observed  Perception WDL  Hallucination None reported or observed  Judgment Impaired  Confusion Mild  Danger to Self  Current suicidal ideation? Denies  Agreement Not to Harm Self Yes  Description of Agreement Verbal  Danger to Others  Danger to Others None reported or observed

## 2024-05-26 NOTE — Group Note (Signed)
 Date:  05/26/2024 Time:  11:15 AM  Group Topic/Focus:  Emotional Education:   The focus of this group is to discuss what feelings/emotions are, and how they are experienced.    Participation Level:  Active  Participation Quality:  Appropriate  Affect:  Appropriate  Cognitive:  Appropriate  Insight: Appropriate  Engagement in Group:  Engaged  Modes of Intervention:  Discussion  Benjamin [name] 05/26/2024, 11:15 AM

## 2024-05-26 NOTE — Plan of Care (Signed)
   Problem: Education: Goal: Knowledge of General Education information will improve Description: Including pain rating scale, medication(s)/side effects and non-pharmacologic comfort measures Outcome: Progressing   Problem: Health Behavior/Discharge Planning: Goal: Ability to manage health-related needs will improve Outcome: Progressing   Problem: Safety: Goal: Ability to remain free from injury will improve Outcome: Progressing

## 2024-05-27 DIAGNOSIS — F22 Delusional disorders: Secondary | ICD-10-CM | POA: Diagnosis not present

## 2024-05-27 DIAGNOSIS — F0390 Unspecified dementia without behavioral disturbance: Secondary | ICD-10-CM | POA: Diagnosis not present

## 2024-05-27 NOTE — Progress Notes (Signed)
 Estimated Sleeping Duration (Last 24 Hours): 8.75-10.00 hours    05/27/24 0023  Psych Admission Type (Psych Patients Only)  Admission Status Involuntary  Psychosocial Assessment  Patient Complaints None  Eye Contact Fair  Facial Expression Flat  Affect Flat  Speech Logical/coherent  Interaction Minimal  Motor Activity Slow  Appearance/Hygiene In scrubs  Behavior Characteristics Cooperative  Mood Pleasant  Thought Process  Coherency WDL  Content WDL  Delusions None reported or observed  Perception WDL  Hallucination None reported or observed  Judgment Impaired  Confusion Mild  Danger to Self  Current suicidal ideation? Denies  Agreement Not to Harm Self Yes  Description of Agreement verbal  Danger to Others  Danger to Others None reported or observed

## 2024-05-27 NOTE — Group Note (Signed)
 Date:  05/27/2024 Time:  11:01 PM  Group Topic/Focus:  Wrap-Up Group:   The focus of this group is to help patients review their daily goal of treatment and discuss progress on daily workbooks.    Participation Level:  Active  Participation Quality:  Appropriate  Affect:  Appropriate  Cognitive:  Alert  Insight: Appropriate  Engagement in Group:  Engaged  Modes of Intervention:  Discussion  Additional Comments:    Benjamin [name] 05/27/2024, 11:01 PM

## 2024-05-27 NOTE — Group Note (Signed)
 [facility] LCSW Group Therapy Note   Group Date: 05/27/2024 Start Time: 1300 End Time: 1400   Type of Therapy/Topic:  Group Therapy:  Balance in Life  Participation Level:  Did Not Attend   Description of Group:    This group will address the concept of balance and how it feels and looks when one is unbalanced. Patients will be encouraged to process areas in their lives that are out of balance, and identify reasons for remaining unbalanced. Facilitators will guide patients utilizing problem- solving interventions to address and correct the stressor making their life unbalanced. Understanding and applying boundaries will be explored and addressed for obtaining  and maintaining a balanced life. Patients will be encouraged to explore ways to assertively make their unbalanced needs known to significant others in their lives, using other group members and facilitator for support and feedback.  Therapeutic Goals: Patient will identify two or more emotions or situations they have that consume much of in their lives. Patient will identify signs/triggers that life has become out of balance:  Patient will identify two ways to set boundaries in order to achieve balance in their lives:  Patient will demonstrate ability to communicate their needs through discussion and/or role plays  Summary of Patient Progress:    Patient did not attend.    Therapeutic Modalities:   Cognitive Behavioral Therapy Solution-Focused Therapy Assertiveness Training   Rexene LELON Mae, LCSWA

## 2024-05-27 NOTE — Group Note (Signed)
 Date:  05/27/2024 Time:  4:23 PM  Group Topic/Focus:  Recovery Goals:   The focus of this group is to identify appropriate goals for recovery and establish a plan to achieve them.    Participation Level:  Did Not Attend   Arland Nutting 05/27/2024, 4:23 PM

## 2024-05-27 NOTE — Progress Notes (Signed)
   05/27/24 0913  Psych Admission Type (Psych Patients Only)  Admission Status Involuntary  Psychosocial Assessment  Patient Complaints None  Eye Contact Fair  Facial Expression Flat  Affect Flat  Speech Logical/coherent  Interaction Minimal  Motor Activity Slow  Appearance/Hygiene In scrubs  Behavior Characteristics Cooperative  Mood Pleasant  Aggressive Behavior  Targets  (none)  Type of Behavior  (none)  Effect No apparent injury  Thought Process  Coherency WDL  Content WDL  Delusions None reported or observed  Perception WDL  Hallucination None reported or observed  Judgment Impaired  Confusion Mild  Danger to Self  Current suicidal ideation?  (Denies)  Description of Suicide Plan no plan  Self-Injurious Behavior  (none of these behaviors present)  Agreement Not to Harm Self Yes  Description of Agreement Verbal

## 2024-05-27 NOTE — Plan of Care (Signed)
  Problem: Education: Goal: Knowledge of General Education information will improve Description: Including pain rating scale, medication(s)/side effects and non-pharmacologic comfort measures Outcome: Progressing   Problem: Health Behavior/Discharge Planning: Goal: Ability to manage health-related needs will improve Outcome: Progressing   Problem: Clinical Measurements: Goal: Ability to maintain clinical measurements within normal limits will improve Outcome: Progressing Goal: Will remain free from infection Outcome: Progressing Goal: Diagnostic test results will improve Outcome: Progressing Goal: Respiratory complications will improve Outcome: Progressing Goal: Cardiovascular complication will be avoided Outcome: Progressing   Problem: Activity: Goal: Risk for activity intolerance will decrease Outcome: Progressing   Problem: Nutrition: Goal: Adequate nutrition will be maintained Outcome: Progressing   Problem: Coping: Goal: Level of anxiety will decrease Outcome: Progressing   Problem: Elimination: Goal: Will not experience complications related to bowel motility Outcome: Progressing Goal: Will not experience complications related to urinary retention Outcome: Progressing   Problem: Pain Managment: Goal: General experience of comfort will improve and/or be controlled Outcome: Progressing   Problem: Safety: Goal: Ability to remain free from injury will improve Outcome: Progressing   Problem: Skin Integrity: Goal: Risk for impaired skin integrity will decrease Outcome: Progressing   Problem: Education: Goal: Knowledge of [place] [facility] Education information/materials will improve Outcome: Progressing Goal: Emotional status will improve Outcome: Progressing Goal: Mental status will improve Outcome: Progressing Goal: Verbalization of understanding the information provided will improve Outcome: Progressing   Problem: Activity: Goal: Interest or  engagement in activities will improve Outcome: Progressing Goal: Sleeping patterns will improve Outcome: Progressing   Problem: Coping: Goal: Ability to verbalize frustrations and anger appropriately will improve Outcome: Progressing Goal: Ability to demonstrate self-control will improve Outcome: Progressing   Problem: Health Behavior/Discharge Planning: Goal: Identification of resources available to assist in meeting health care needs will improve Outcome: Progressing Goal: Compliance with treatment plan for underlying cause of condition will improve Outcome: Progressing   Problem: Physical Regulation: Goal: Ability to maintain clinical measurements within normal limits will improve Outcome: Progressing   Problem: Safety: Goal: Periods of time without injury will increase Outcome: Progressing

## 2024-05-27 NOTE — Progress Notes (Signed)
 HH Psychiatry Progress Note  Date/Time: 05/26/2024 3:45 PM Patient: Benjamin [name], 78 year old male MRN: [id number]    Identifying Information / Reason for Admission  Mr. Salais is a 79 year old male admitted to the inpatient geriatric psychiatry unit following a fall from a two-story building in the context of acute paranoia. He reported that individuals from the [place] gang were attempting to kill him and that he escaped through a bedroom window to save his life. He had been living with the "[name]" family for several years without prior incident. Adult Protective Services (APS) has since assumed guardianship and is assisting with placement planning.  Subjective Was seen in the milieu doing better at baseline Chart and nursing notes reviewed. Patient seen and evaluated. He remains calm, cooperative, and resting in his room.He denies suicidal or homicidal ideation, intent, or plan. He denies hallucinations. Staff report that he is redirectable, attending to ADLs, and participating intermittently in group activities. No behavioral disturbances or aggression observed.  He continues to exhibit cognitive impairment with poor short-term memory, impaired reasoning, and persistent paranoid ideation. APS remains involved and is coordinating long-term placement.  Capacity Assessment  Understanding of medical problem: Unable to articulate awareness of his medical or psychiatric condition.  Understanding of proposed treatment: Unable to demonstrate comprehension of medication purpose or need.  Appreciation and reasoning: Lacks ability to weigh treatment benefits versus risks due to cognitive decline and paranoia. Conclusion: Patient lacks capacity to make informed medical or treatment decisions at this time.  Mental Status Examination  Appearance: Well-nourished elderly male, appropriately dressed for setting. Behavior: Calm, cooperative, redirectable. Speech: Clear, coherent, normal  volume. Mood: "Okay." Affect: Constricted but congruent. Thought Process: Disorganized; impaired coherence. Thought Content: Paranoid delusions at baseline; denies auditory/visual hallucinations. SI/HI: Denied. Orientation: Oriented to person, partially to place; disoriented to time/situation. Cognition: Impaired short-term memory and executive function. Insight: Poor. Judgment: Poor.  Sleep: Fair Appetite: Fair Vital Signs  BP 101/69  HR 67  Temp 97.8 F  RR 18  SpO? 99%  BMI 29.9 kg/m  Pertinent Labs and Studies  HbA1c: 5.6 (04/02/24)  Lipid Panel: Chol 146, HDL 43, LDL 96, TG 37 (04/02/24)  Blood Alcohol: <15 (03/26/24)  No new labs in past 48 hours.  Active Diagnoses  Delusional Disorder (HCC)  Major Neurocognitive Disorder (Dementia)  Rule out unspecified psychosis, schizophrenia, schizoaffective disorder  Current Medications  Fluphenazine  (Prolixin ) 10 mg PO BID (IM backup 5 mg BID PRN if non-compliant)  Docusate sodium  100 mg PO daily  PRN Medications: Tylenol , Maalox, Milk of Magnesia, Miralax , Zyprexa  (oral or IM for agitation)  Clinical Assessment / Decision Making  Mr. Marczak continues to exhibit significant cognitive and functional impairment consistent with dementia and chronic paranoid delusions. He lacks decisional capacity and remains unable to formulate a discharge plan. No acute agitation or suicidal behavior noted. He is psychiatrically and medically stable but requires structured supervision and long-term placement for safety and medication adherence.  Differential: Delusional disorder vs. psychosis secondary to major neurocognitive disorder.  Treatment Plan  Safety / Monitoring  Continue involuntary inpatient admission for stabilization and supervision.  Observation: q15-minute safety checks.  Precautions: Suicide, elopement, and assault.  Vitals: q12 hours.  Psychiatric Management  Continue Prolixin  10 mg PO BID; IM option if  non-compliant.  Discontinue Zyprexa  due to redundancy.  Continue supportive psychotherapy and milieu engagement as tolerated.  Risks, benefits, and alternatives discussed; informed consent limited due to incapacity--treatment proceeding under guardianship oversight.  Medical Management  No acute medical concerns. Continue  bowel regimen and PRNs as needed.  Encourage hydration, nutrition, and physical activity as tolerated.  Discharge / Disposition  APS to finalize long-term placement.  Social work and case management coordinating discharge planning.  Estimated length of stay: 3-4 days pending placement.  Summary Impression  78 year old male with major neurocognitive disorder and delusional disorder, currently stable on medication but continues to demonstrate poor insight, impaired cognition, and paranoia. No aggression or suicidal behavior observed. Lacks capacity for medical decision-making; APS remains primary decision-maker. Awaiting safe placement.  [place] 10.4.2025 11AM

## 2024-05-27 NOTE — Plan of Care (Signed)
  Problem: Nutrition: Goal: Adequate nutrition will be maintained Outcome: Progressing   Problem: Coping: Goal: Level of anxiety will decrease Outcome: Progressing   Problem: Education: Goal: Emotional status will improve Outcome: Progressing   

## 2024-05-27 NOTE — Group Note (Signed)
 Date:  05/27/2024 Time:  11:21 AM  Group Topic/Focus:  Coping With Mental Health Crisis:   The purpose of this group is to help patients identify strategies for coping with mental health crisis.  Group discusses possible causes of crisis and ways to manage them effectively.    Participation Level:  Active  Participation Quality:  Appropriate  Affect:  Appropriate  Cognitive:  Appropriate  Insight: Appropriate  Engagement in Group:  Engaged  Modes of Intervention:  Discussion   Benjamin [name] 05/27/2024, 11:21 AM

## 2024-05-28 DIAGNOSIS — F0390 Unspecified dementia without behavioral disturbance: Secondary | ICD-10-CM | POA: Diagnosis not present

## 2024-05-28 DIAGNOSIS — F22 Delusional disorders: Secondary | ICD-10-CM | POA: Diagnosis not present

## 2024-05-28 NOTE — Plan of Care (Signed)
   Problem: Activity: Goal: Risk for activity intolerance will decrease Outcome: Progressing   Problem: Nutrition: Goal: Adequate nutrition will be maintained Outcome: Progressing   Problem: Coping: Goal: Level of anxiety will decrease Outcome: Progressing

## 2024-05-28 NOTE — Group Note (Signed)
 Date:  05/28/2024 Time:  3:09 PM  Group Topic/Focus:  Self Care:   The focus of this group is to help patients understand the importance of self-care in order to improve or restore emotional, physical, spiritual, interpersonal, and financial health.    Participation Level:  Did Not Attend   [name] Gavel 05/28/2024, 3:09 PM

## 2024-05-28 NOTE — Group Note (Signed)
 Recreation Therapy Group Note   Group Topic:Other  Group Date: 05/28/2024 Start Time: 1400 End Time: 1440 Facilitators: Celestia Jeoffrey BRAVO, LRT, CTRS Location: Dayroom  Activity Description/Intervention: Therapeutic Drumming. Patients with peers and staff were given the opportunity to engage in a leader facilitated [facility] Group Empowerment Drumming Circle with staff from the [employer], in partnership with [employer]. Teaching laboratory technician and trained [employer], Norleen Mon leading with LRT observing and documenting intervention and pt response. This evidenced-based practice targets 7 areas of health and wellbeing in the human experience including: stress-reduction, exercise, self-expression, camaraderie/support, nurturing, spirituality, and music-making (leisure).    Goal Area(s) Addresses:  Patient will engage in pro-social way in music group.  Patient will follow directions of drum leader on the first prompt. Patient will demonstrate no behavioral issues during group.  Patient will identify if a reduction in stress level occurs as a result of participation in therapeutic drum circle.      Affect/Mood: N/A   Participation Level: Did not attend    Clinical Observations/Individualized Feedback: Patient did not attend group.   Plan: Continue to engage patient in RT group sessions 2-3x/week.   Jeoffrey BRAVO Celestia, LRT, CTRS 05/28/2024 4:28 PM

## 2024-05-28 NOTE — Progress Notes (Signed)
   05/28/24 2100  Psych Admission Type (Psych Patients Only)  Admission Status Involuntary  Psychosocial Assessment  Patient Complaints None  Eye Contact Brief  Facial Expression Animated  Affect Appropriate to circumstance  Speech Logical/coherent  Interaction Minimal  Motor Activity Slow  Appearance/Hygiene In scrubs  Behavior Characteristics Cooperative;Appropriate to situation  Mood Pleasant  Thought Process  Coherency WDL  Content WDL  Delusions None reported or observed  Perception WDL  Hallucination None reported or observed  Judgment Impaired  Confusion Mild  Danger to Self  Current suicidal ideation? Denies  Self-Injurious Behavior No self-injurious ideation or behavior indicators observed or expressed   Danger to Others  Danger to Others None reported or observed   Mood/Behavior:  Pleasant and cooperative.  Mild confusion.    Psych assessment: Denies SI/HI and AVH.     Interaction / Group attendance:  Present in the milieu. Minimal interaction with peers and staff.    Medication/ PRNs: Compliant with scheduled medications. No PRNs given.   Pain: Denies   15 min checks in place for safety.

## 2024-05-28 NOTE — Progress Notes (Signed)
 Mood/Behavior: Pleasant and cooperative.    Psych assessment:  Denies SI/HI and AVH.  Interaction / Group attendance:  Present in the milieu for meals.  Minimal interaction with peers and staff.  Medication/ PRNs:  Compliant with medications.   Pain: Denies  15 min checks in place for safety.

## 2024-05-28 NOTE — [facility] Counselor (Signed)
 CSW contacted [name], [phone] for updates regarding placement of pt .   CSW unable to reach, left HIPAA compliant VM requesting return call.  Lum Croft, MSW, [place] 05/28/2024 1:54 PM

## 2024-05-28 NOTE — Progress Notes (Signed)
 [facility] MD Progress Note  05/28/2024 9:45 PM Benjamin [name]  MRN:  [id number] Benjamin [name] is a 78 year old male who presents to the inpatient geriatric psych unit after jumping from a two story building. Patient was originally seen at [facility] who referred him to the ED who admitted him here on 03/27/2024. Patient reports that people from the [place] gang were trying to kill him and the only escape was through the bedroom window on the second floor. He currently lives in the house with [name] and his family and has been living with them for 3-4 years without any problems. On 03/19/2024 he went to the doctor and when he got back home the family wouldn't let him leave. He states that after he realized the gang was going to kill him he barricaded the door with his bed and jumped out the window. When he landed he ran away until one of his neighbors found him and called 911.   Subjective: Chart is reviewed and discussed with the treatment team.  APS is working on legal guardianship and placement.  Today on interview patient is noted to be sitting in the dayroom.  He is noted to be interacting with other peers.  He remains verbally minimal.  He denies SI/HI/plan and denies auditory/visual hallucinations.  He has fair appetite and sleep.  No behavioral problems.  He remains confused at baseline about his stay in the hospital and the wait for placement.    Capacity :Able to understand medical problem Patient is unable to display any understanding of his medical problems or the current presentation leading up to admission Able to understand proposed treatment  Patient is unable to understand the proposed treatment with antipsychotics Patient remains paranoid and has cognitive impairment as he is unable to discuss his discharge planning.  Given his paranoia and delay in executive function patient is unable to make decisions regarding the treatment and disposition.    Sleep:  Fair  Appetite:  Fair  Past Psychiatric History: see h&P  Family History:  Family History  Problem Relation Age of Onset   Colon cancer Neg Hx    Colon polyps Neg Hx    Stomach cancer Neg Hx    Esophageal cancer Neg Hx    Social History:  Social History   Substance and Sexual Activity  Alcohol Use No     Social History   Substance and Sexual Activity  Drug Use No    Social History   Socioeconomic History   Marital status: Single    Spouse name: Not on file   Number of children: Not on file   Years of education: Not on file   Highest education level: Not on file  Occupational History   Not on file  Tobacco Use   Smoking status: Former    Types: Cigarettes   Smokeless tobacco: Never  Vaping Use   Vaping status: Never Used  Substance and Sexual Activity   Alcohol use: No   Drug use: No   Sexual activity: Not on file  Other Topics Concern   Not on file  Social History Narrative   Not on file   Social Drivers of Health   Financial Resource Strain: Not on file  Food Insecurity: No Food Insecurity (03/27/2024)   Hunger Vital Sign    Worried About Running Out of Food in the Last Year: Never true    Ran Out of Food in the Last Year: Never true  Transportation Needs: No Transportation Needs (  03/27/2024)   PRAPARE - Administrator, Civil Service (Medical): No    Lack of Transportation (Non-Medical): No  Physical Activity: Not on file  Stress: Not on file  Social Connections: Moderately Integrated (03/27/2024)   Social Connection and Isolation Panel    Frequency of Communication with Friends and Family: Twice a week    Frequency of Social Gatherings with Friends and Family: Once a week    Attends Religious Services: 1 to 4 times per year    Active Member of [employer] or Organizations: No    Attends Engineer, structural: 1 to 4 times per year    Marital Status: Divorced   Past Medical History:  Past Medical History:  Diagnosis Date   Allergy     Arthritis    back   Prostate cancer (HCC) dx'd 2010   surg only    Past Surgical History:  Procedure Laterality Date   COLONOSCOPY     EYE SURGERY     PROSTATE SURGERY      Current Medications: Current Facility-Administered Medications  Medication Dose Route Frequency Provider Last Rate Last Admin   acetaminophen  (TYLENOL ) tablet 650 mg  650 mg Oral Q6H PRN Coleman, Carolyn H, NP       alum & mag hydroxide-simeth (MAALOX/MYLANTA) 200-200-20 MG/5ML suspension 30 mL  30 mL Oral Q4H PRN Coleman, Carolyn H, NP       docusate sodium  (COLACE) capsule 100 mg  100 mg Oral Daily Alessandria Henken, MD   100 mg at 05/28/24 0913   fluPHENAZine  (PROLIXIN ) tablet 10 mg  10 mg Oral BID Shrivastava, Aryendra, MD   10 mg at 05/28/24 2123   Or   fluPHENAZine  (PROLIXIN ) injection 5 mg  5 mg Intramuscular BID Shrivastava, Aryendra, MD       magnesium  hydroxide (MILK OF MAGNESIA) suspension 30 mL  30 mL Oral Daily PRN Mardy Elveria DEL, NP       OLANZapine  (ZYPREXA ) injection 5 mg  5 mg Intramuscular TID PRN Mardy Elveria DEL, NP   5 mg at 04/12/24 2230   OLANZapine  zydis (ZYPREXA ) disintegrating tablet 5 mg  5 mg Oral TID PRN Mardy Elveria DEL, NP       polyethylene glycol (MIRALAX  / GLYCOLAX ) packet 17 g  17 g Oral Daily PRN Donnelly Mellow, MD   17 g at 05/10/24 0830    Lab Results:  No results found for this or any previous visit (from the past 48 hours).    Blood Alcohol level:  Lab Results  Component Value Date   [place] [place] <15 03/26/2024    Metabolic Disorder Labs: Lab Results  Component Value Date   HGBA1C 5.6 04/02/2024   MPG 114 04/02/2024   No results found for: PROLACTIN Lab Results  Component Value Date   CHOL 146 04/02/2024   TRIG 37 04/02/2024   HDL 43 04/02/2024   CHOLHDL 3.4 04/02/2024   VLDL 7 04/02/2024   LDLCALC 96 04/02/2024    Physical Findings: AIMS:  , ,  ,  ,    CIWA:    COWS:      Psychiatric Specialty Exam:  Presentation  General Appearance:   Appropriate for Environment; Bizarre  Eye Contact: Fair  Speech: Clear and Coherent  Speech Volume: Normal    Mood and Affect  Mood: Anxious  Affect: Congruent   Thought Process  Thought Processes: Disorganized  Descriptions of Associations:Intact  Orientation:Full (Time, Place and Person)  Thought Content: Paranoia at baseline Hallucinations: Denies  Ideas of Reference: Paranoia at baseline Suicidal Thoughts: Denies  Homicidal Thoughts: Denies   Sensorium  Memory: Immediate Fair; Recent Fair; Remote Fair  Judgment: Impaired  Insight: Shallow   Executive Functions  Concentration: Fair  Attention Span: Fair  Recall: [employer] of Knowledge: Fair  Language: Fair   Psychomotor Activity  Psychomotor Activity: No data recorded  Musculoskeletal: Strength & Muscle Tone: within normal limits Gait & Station: normal Assets  Assets: Manufacturing systems engineer; Resilience    Physical Exam: Physical Exam Vitals and nursing note reviewed.    ROS Blood pressure 117/77, pulse 69, temperature 97.6 F (36.4 C), resp. rate 18, height 6' (1.829 m), weight 100 kg, SpO2 98%. Body mass index is 29.91 kg/m.  Diagnosis: Principal Problem:   Delusional disorder [facility])  Major Neurocognitive disorder  Clinical Decision Making: Patient currently admitted after jumping off a two-story building in the context of possible delusions as reported as the people in the house he lived for 4 years trying to kill him.  Patient needs to be monitored closely for ongoing psychosis and paranoid delusions.  Given patient's confusion and chronic paranoia, no family support, APS report has been made and it got screened in for further evaluation by APS.   Differential diagnosis include: unspecified psychosis, delusion disorder, dementia, schizophrenia/schizoaffective disorder   Treatment Plan Summary: Requested the social work team to reach out to APS for safety concerns  in the community as patient has chronic paranoia and confusion and is currently homeless patient remains confused and paranoid and spite of being compliant with medications.  Patient displays cognitive delay and deficits in executive function like planning, organizing and processing information.  At this point patient lacks capacity to make medical decisions.  We recommend APS referral for possible legal guardianship as he has no family or legal next of kin to help with decision-making.   Safety and Monitoring:             -- InVoluntary admission to inpatient psychiatric unit for safety, stabilization and treatment             -- Daily contact with patient to assess and evaluate symptoms and progress in treatment             -- Patient's case to be discussed in multi-disciplinary team meeting             -- Observation Level: q15 minute checks             -- Vital signs:  q12 hours             -- Precautions: suicide, elopement, and assault   2. Psychiatric Diagnoses and Treatment:              Continue Prolixin  to 10 mg twice daily. discontinued Zyprexa .  -- The risks/benefits/side-effects/alternatives to this medication were discussed in detail with the patient and time was given for questions. The patient consents to medication trial.                -- Metabolic profile and EKG monitoring obtained while on an atypical antipsychotic (BMI: Lipid Panel: HbgA1c: QTc:)              -- Encouraged patient to participate in unit milieu and in scheduled group therapies                            3. Medical Issues Being Addressed:    No urgent  medical needs noted  4. Discharge Planning:   -- Social work and case management to assist with discharge planning and identification of hospital follow-up needs prior to discharge  -- Estimated LOS: 3-4 days  Crislyn Willbanks, MD 05/28/2024, 9:45 PM

## 2024-05-28 NOTE — Plan of Care (Signed)
  Problem: Education: Goal: Knowledge of General Education information will improve Description: Including pain rating scale, medication(s)/side effects and non-pharmacologic comfort measures Outcome: Progressing   Problem: Health Behavior/Discharge Planning: Goal: Ability to manage health-related needs will improve Outcome: Progressing   Problem: Clinical Measurements: Goal: Ability to maintain clinical measurements within normal limits will improve Outcome: Progressing Goal: Will remain free from infection Outcome: Progressing Goal: Diagnostic test results will improve Outcome: Progressing Goal: Respiratory complications will improve Outcome: Progressing Goal: Cardiovascular complication will be avoided Outcome: Progressing   Problem: Activity: Goal: Risk for activity intolerance will decrease Outcome: Progressing   Problem: Nutrition: Goal: Adequate nutrition will be maintained Outcome: Progressing   Problem: Coping: Goal: Level of anxiety will decrease Outcome: Progressing   Problem: Elimination: Goal: Will not experience complications related to bowel motility Outcome: Progressing Goal: Will not experience complications related to urinary retention Outcome: Progressing   Problem: Pain Managment: Goal: General experience of comfort will improve and/or be controlled Outcome: Progressing   Problem: Safety: Goal: Ability to remain free from injury will improve Outcome: Progressing   Problem: Skin Integrity: Goal: Risk for impaired skin integrity will decrease Outcome: Progressing   Problem: Education: Goal: Knowledge of [place] [facility] Education information/materials will improve Outcome: Progressing Goal: Emotional status will improve Outcome: Progressing Goal: Mental status will improve Outcome: Progressing Goal: Verbalization of understanding the information provided will improve Outcome: Progressing   Problem: Activity: Goal: Interest or  engagement in activities will improve Outcome: Progressing Goal: Sleeping patterns will improve Outcome: Progressing   Problem: Coping: Goal: Ability to verbalize frustrations and anger appropriately will improve Outcome: Progressing Goal: Ability to demonstrate self-control will improve Outcome: Progressing   Problem: Health Behavior/Discharge Planning: Goal: Identification of resources available to assist in meeting health care needs will improve Outcome: Progressing Goal: Compliance with treatment plan for underlying cause of condition will improve Outcome: Progressing   Problem: Physical Regulation: Goal: Ability to maintain clinical measurements within normal limits will improve Outcome: Progressing   Problem: Safety: Goal: Periods of time without injury will increase Outcome: Progressing

## 2024-05-28 NOTE — Plan of Care (Signed)
  Problem: Education: Goal: Emotional status will improve Outcome: Progressing Goal: Verbalization of understanding the information provided will improve Outcome: Progressing   Problem: Activity: Goal: Interest or engagement in activities will improve Outcome: Progressing   

## 2024-05-28 NOTE — Progress Notes (Signed)
 Estimated Sleeping Duration (Last 24 Hours): 7.50-8.75 hours    05/28/24 0010  Psych Admission Type (Psych Patients Only)  Admission Status Involuntary  Psychosocial Assessment  Patient Complaints None  Eye Contact Fair  Facial Expression Animated  Affect Appropriate to circumstance  Speech Logical/coherent  Interaction Minimal  Motor Activity Slow  Appearance/Hygiene In scrubs  Behavior Characteristics Cooperative  Mood Pleasant  Thought Process  Coherency WDL  Content WDL  Delusions None reported or observed  Perception WDL  Hallucination None reported or observed  Judgment Impaired  Confusion Mild  Danger to Self  Current suicidal ideation? Denies  Description of Suicide Plan denies  Self-Injurious Behavior No self-injurious ideation or behavior indicators observed or expressed   Agreement Not to Harm Self Yes  Description of Agreement verbal  Danger to Others  Danger to Others None reported or observed

## 2024-05-28 NOTE — [facility] Group Notes (Signed)
 Adult Psychoeducational Group Note  Date:  05/28/2024 Time:  10:26 PM  Group Topic/Focus:  Wrap-Up Group:   The focus of this group is to help patients review their daily goal of treatment and discuss progress on daily workbooks.  Participation Level:  Active  Participation Quality:  Appropriate  Affect:  Appropriate  Cognitive:  Appropriate  Insight: Appropriate  Engagement in Group:  Engaged  Modes of Intervention:  Discussion and Support  Additional Comments:  Pt told that, when feeling well, he's more socially engaged with friends and family. On the subject of ways to stay well upon discharge, Pt mentioned a particular focus on nutritional health by eating the right foods and avoiding unhealthy foods.  Quinisha Mould Lee 05/28/2024, 10:26 PM

## 2024-05-29 DIAGNOSIS — F0390 Unspecified dementia without behavioral disturbance: Secondary | ICD-10-CM | POA: Diagnosis not present

## 2024-05-29 DIAGNOSIS — F22 Delusional disorders: Secondary | ICD-10-CM | POA: Diagnosis not present

## 2024-05-29 NOTE — Progress Notes (Signed)
 [facility] MD Progress Note  05/29/2024 9:58 PM Benjamin [name]  MRN:  [id number] Benjamin [name] is a 78 year old male who presents to the inpatient geriatric psych unit after jumping from a two story building. Patient was originally seen at [facility] who referred him to the ED who admitted him here on 03/27/2024. Patient reports that people from the [place] gang were trying to kill him and the only escape was through the bedroom window on the second floor. He currently lives in the house with [name] and his family and has been living with them for 3-4 years without any problems. On 03/19/2024 he went to the doctor and when he got back home the family wouldn't let him leave. He states that after he realized the gang was going to kill him he barricaded the door with his bed and jumped out the window. When he landed he ran away until one of his neighbors found him and called 911.   Subjective: Chart is reviewed and discussed with the treatment team.  APS is working on legal guardianship and placement.  Patient is noted to be sitting in the day area.  He is noted to be interacting with other peers.  He remains verbally minimal but answers the questions with one-word answers.  He offers no physical complaints.  He is not endorsing any SI/HI/plan and is not endorsing hallucinations.  Per nursing report patient is taking his medications with no reported side effects.    Capacity :Able to understand medical problem Patient is unable to display any understanding of his medical problems or the current presentation leading up to admission Able to understand proposed treatment  Patient is unable to understand the proposed treatment with antipsychotics Patient remains paranoid and has cognitive impairment as he is unable to discuss his discharge planning.  Given his paranoia and delay in executive function patient is unable to make decisions regarding the treatment and disposition.    Sleep:  Fair  Appetite:  Fair  Past Psychiatric History: see h&P  Family History:  Family History  Problem Relation Age of Onset   Colon cancer Neg Hx    Colon polyps Neg Hx    Stomach cancer Neg Hx    Esophageal cancer Neg Hx    Social History:  Social History   Substance and Sexual Activity  Alcohol Use No     Social History   Substance and Sexual Activity  Drug Use No    Social History   Socioeconomic History   Marital status: Single    Spouse name: Not on file   Number of children: Not on file   Years of education: Not on file   Highest education level: Not on file  Occupational History   Not on file  Tobacco Use   Smoking status: Former    Types: Cigarettes   Smokeless tobacco: Never  Vaping Use   Vaping status: Never Used  Substance and Sexual Activity   Alcohol use: No   Drug use: No   Sexual activity: Not on file  Other Topics Concern   Not on file  Social History Narrative   Not on file   Social Drivers of Health   Financial Resource Strain: Not on file  Food Insecurity: No Food Insecurity (03/27/2024)   Hunger Vital Sign    Worried About Running Out of Food in the Last Year: Never true    Ran Out of Food in the Last Year: Never true  Transportation Needs: No Transportation  Needs (03/27/2024)   PRAPARE - Administrator, Civil Service (Medical): No    Lack of Transportation (Non-Medical): No  Physical Activity: Not on file  Stress: Not on file  Social Connections: Moderately Integrated (03/27/2024)   Social Connection and Isolation Panel    Frequency of Communication with Friends and Family: Twice a week    Frequency of Social Gatherings with Friends and Family: Once a week    Attends Religious Services: 1 to 4 times per year    Active Member of [employer] or Organizations: No    Attends Engineer, structural: 1 to 4 times per year    Marital Status: Divorced   Past Medical History:  Past Medical History:  Diagnosis Date   Allergy     Arthritis    back   Prostate cancer (HCC) dx'd 2010   surg only    Past Surgical History:  Procedure Laterality Date   COLONOSCOPY     EYE SURGERY     PROSTATE SURGERY      Current Medications: Current Facility-Administered Medications  Medication Dose Route Frequency Provider Last Rate Last Admin   acetaminophen  (TYLENOL ) tablet 650 mg  650 mg Oral Q6H PRN Coleman, Carolyn H, NP       alum & mag hydroxide-simeth (MAALOX/MYLANTA) 200-200-20 MG/5ML suspension 30 mL  30 mL Oral Q4H PRN Coleman, Carolyn H, NP       docusate sodium  (COLACE) capsule 100 mg  100 mg Oral Daily Waylon Koffler, MD   100 mg at 05/29/24 1038   fluPHENAZine  (PROLIXIN ) tablet 10 mg  10 mg Oral BID Shrivastava, Aryendra, MD   10 mg at 05/29/24 2116   Or   fluPHENAZine  (PROLIXIN ) injection 5 mg  5 mg Intramuscular BID Shrivastava, Aryendra, MD       magnesium  hydroxide (MILK OF MAGNESIA) suspension 30 mL  30 mL Oral Daily PRN Mardy Elveria DEL, NP       OLANZapine  (ZYPREXA ) injection 5 mg  5 mg Intramuscular TID PRN Mardy Elveria DEL, NP   5 mg at 04/12/24 2230   OLANZapine  zydis (ZYPREXA ) disintegrating tablet 5 mg  5 mg Oral TID PRN Mardy Elveria DEL, NP       polyethylene glycol (MIRALAX  / GLYCOLAX ) packet 17 g  17 g Oral Daily PRN Donnelly Mellow, MD   17 g at 05/10/24 0830    Lab Results:  No results found for this or any previous visit (from the past 48 hours).    Blood Alcohol level:  Lab Results  Component Value Date   [facility] <15 03/26/2024    Metabolic Disorder Labs: Lab Results  Component Value Date   HGBA1C 5.6 04/02/2024   MPG 114 04/02/2024   No results found for: PROLACTIN Lab Results  Component Value Date   CHOL 146 04/02/2024   TRIG 37 04/02/2024   HDL 43 04/02/2024   CHOLHDL 3.4 04/02/2024   VLDL 7 04/02/2024   LDLCALC 96 04/02/2024    Physical Findings: AIMS:  , ,  ,  ,    CIWA:    COWS:      Psychiatric Specialty Exam:  Presentation  General Appearance:   Appropriate for Environment; Bizarre  Eye Contact: Fair  Speech: Clear and Coherent  Speech Volume: Normal    Mood and Affect  Mood: Anxious  Affect: Congruent   Thought Process  Thought Processes: Disorganized  Descriptions of Associations:Intact  Orientation:Full (Time, Place and Person)  Thought Content: Paranoia at baseline Hallucinations:  Denies  Ideas of Reference: Paranoia at baseline Suicidal Thoughts: Denies  Homicidal Thoughts: Denies   Sensorium  Memory: Immediate Fair; Recent Fair; Remote Fair  Judgment: Impaired  Insight: Shallow   Executive Functions  Concentration: Fair  Attention Span: Fair  Recall: [employer] of Knowledge: Fair  Language: Fair   Psychomotor Activity  Psychomotor Activity: No data recorded  Musculoskeletal: Strength & Muscle Tone: within normal limits Gait & Station: normal Assets  Assets: Manufacturing systems engineer; Resilience    Physical Exam: Physical Exam Vitals and nursing note reviewed.    ROS Blood pressure 97/68, pulse 66, temperature (!) 97.4 F (36.3 C), resp. rate 18, height 6' (1.829 m), weight 100 kg, SpO2 100%. Body mass index is 29.91 kg/m.  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  Patient needs to be monitored closely for ongoing psychosis and paranoid delusions.  Given patient's confusion and chronic paranoia, no family support, APS report has been made and it got screened in for further evaluation by APS.   Differential diagnosis include: unspecified psychosis, delusion disorder, dementia, schizophrenia/schizoaffective disorder   Treatment Plan Summary: Requested the social work team to reach out to APS for safety  concerns in the community as patient has chronic paranoia and confusion and is currently homeless patient remains confused and paranoid and spite of being compliant with medications.  Patient displays cognitive delay and deficits in executive function like planning, organizing and processing information.  At this point patient lacks capacity to make medical decisions.  We recommend APS referral for possible legal guardianship as he has no family or legal next of kin to help with decision-making.   Safety and Monitoring:             -- InVoluntary admission to inpatient psychiatric unit for safety, stabilization and treatment             -- Daily contact with patient to assess and evaluate symptoms and progress in treatment             -- Patient's case to be discussed in multi-disciplinary team meeting             -- Observation Level: q15 minute checks             -- Vital signs:  q12 hours             -- Precautions: suicide, elopement, and assault   2. Psychiatric Diagnoses and Treatment:              Continue Prolixin  to 10 mg twice daily. discontinued Zyprexa .  -- The risks/benefits/side-effects/alternatives to this medication were discussed in detail with the patient and time was given for questions. The patient consents to medication trial.                -- Metabolic profile and EKG monitoring obtained while on an atypical antipsychotic (BMI: Lipid Panel: HbgA1c: QTc:)              -- Encouraged patient to participate in unit milieu and in scheduled group therapies                            3. Medical Issues Being Addressed:  No urgent medical needs noted  4. Discharge Planning:   -- Social work and case management to assist with discharge planning and identification of hospital follow-up needs prior to discharge  -- Estimated LOS: 3-4 days  Allyn Foil, MD 05/29/2024, 9:58 PM

## 2024-05-29 NOTE — Plan of Care (Signed)
   Problem: Nutrition: Goal: Adequate nutrition will be maintained Outcome: Progressing   Problem: Coping: Goal: Level of anxiety will decrease Outcome: Progressing

## 2024-05-29 NOTE — Group Note (Signed)
 Recreation Therapy Group Note   Group Topic:Stress Management  Group Date: 05/29/2024 Start Time: 1400 End Time: 1500 Facilitators: Celestia Jeoffrey BRAVO, LRT, CTRS Location: Courtyard  Group Description: Stress Jenga. LRT and pts played games of Jenga. LRT prompted group discussion on the physical signs and symptoms of stress, similar to the feeling when you're playing Jenga and trying to remove a wooden block from the stack without it all collapsing. LRT and pt discussed the physical and mental signs of stress, as well as coping skills to manage them.   Goal Area(s) Addressed: Patient will identify physical symptoms of stress. Patient will identify coping skills for stress. Patient will build frustration tolerance skills.  Patient will increase communication.    Affect/Mood: N/A   Participation Level: Did not attend    Clinical Observations/Individualized Feedback: Patient did not attend group.   Plan: Continue to engage patient in RT group sessions 2-3x/week.   Jeoffrey BRAVO Celestia, LRT, CTRS 05/29/2024 4:52 PM

## 2024-05-29 NOTE — Group Note (Signed)
 Date:  05/29/2024 Time:  10:34 AM  Group Topic/Focus:  Fresh air therapy with music and conversation    Participation Level:  Did Not Attend    [name] 05/29/2024, 10:34 AM

## 2024-05-29 NOTE — Progress Notes (Signed)
 Mood/Behavior:  Pleasant and cooperative.   Psych assessment:  No complaints. Denies SI/HI and AVH.    Interaction / Group attendance:  Present in the milieu for meals and some groups.  Minimal interaction with peers and staff.    Medication/ PRNs: Compliant.  No PRNs needed  Pain: Denies  15 min checks in place for safety.

## 2024-05-29 NOTE — Progress Notes (Signed)
   05/29/24 2100  Psych Admission Type (Psych Patients Only)  Admission Status Involuntary  Psychosocial Assessment  Patient Complaints None  Eye Contact Brief  Facial Expression Animated  Affect Appropriate to circumstance  Speech Logical/coherent  Interaction Minimal  Motor Activity Slow  Appearance/Hygiene In scrubs  Behavior Characteristics Appropriate to situation;Cooperative  Mood Pleasant  Thought Process  Coherency WDL  Content WDL  Delusions None reported or observed  Perception WDL  Hallucination None reported or observed  Judgment Impaired  Confusion Mild  Danger to Self  Current suicidal ideation? Denies  Danger to Others  Danger to Others None reported or observed   Mood/Behavior:  Pleasant and cooperative.  Mild confusion.    Psych assessment: Denies SI/HI and AVH.     Interaction / Group attendance:  Present in the milieu. Minimal interaction with peers and staff.     Medication/ PRNs: Compliant with scheduled medications. No PRNs given.   Pain: Denies   15 min checks in place for safety.

## 2024-05-29 NOTE — BH IP Treatment Plan (Signed)
 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[facility])  Secondary Diagnoses: Principal Problem:   Delusional disorder (HCC)   Current Medications:  Current Facility-Administered Medications  Medication Dose Route Frequency Provider Last Rate Last Admin   acetaminophen  (TYLENOL ) tablet 650 mg  650 mg Oral Q6H PRN Coleman, Carolyn H, NP       alum & mag hydroxide-simeth (MAALOX/MYLANTA) 200-200-20 MG/5ML suspension 30 mL  30 mL Oral Q4H PRN Coleman, Carolyn H, NP       docusate sodium  (COLACE) capsule 100 mg  100 mg Oral Daily Jadapalle, Sree, MD   100 mg at 05/29/24 1038   fluPHENAZine  (PROLIXIN ) tablet 10 mg  10 mg Oral BID Shrivastava, Aryendra, MD   10 mg at 05/29/24 1038   Or   fluPHENAZine  (PROLIXIN ) injection 5 mg  5 mg Intramuscular BID Shrivastava, Aryendra, MD       magnesium  hydroxide (MILK OF MAGNESIA) suspension 30 mL  30 mL Oral Daily PRN Coleman, Carolyn H, NP       OLANZapine  (ZYPREXA ) injection 5 mg  5 mg Intramuscular TID PRN Mardy Elveria DEL, NP   5 mg at 04/12/24 2230   OLANZapine  zydis (ZYPREXA ) disintegrating tablet 5 mg  5 mg Oral TID PRN Coleman, Carolyn H, NP       polyethylene glycol (MIRALAX  / GLYCOLAX ) packet 17 g  17 g Oral Daily PRN Jadapalle, Sree, MD   17 g at 05/10/24 0830   PTA Medications: Medications Prior to Admission  Medication Sig Dispense Refill Last Dose/Taking   docusate sodium  (COLACE) 100 MG capsule Take 100 mg by mouth daily.      omega-3 acid ethyl esters (LOVAZA) 1 g capsule Take 1 g by mouth daily.       Patient Stressors: Traumatic event    Patient Strengths: Communication skills   Treatment Modalities: Medication Management, Group therapy, Case management,  1 to 1 session with clinician, Psychoeducation, Recreational therapy.   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  Therapeutic Interventions: 1 to 1 sessions, Unit Group sessions and Medication administration.  Evaluation of Outcomes: Progressing  Physician Treatment Plan for Secondary Diagnosis: Principal Problem:   Delusional disorder (HCC)  Long Term Goal(s): Improvement in symptoms so as ready for discharge   Short Term Goals: Ability to identify changes in lifestyle to reduce recurrence of condition will improve Ability to verbalize feelings will improve Ability to disclose and discuss suicidal ideas Ability to demonstrate self-control will improve Ability to identify and develop effective coping behaviors will improve     Medication Management: Evaluate patient's response, side effects, and tolerance of medication regimen.  Therapeutic Interventions: 1 to 1 sessions, Unit Group sessions and Medication administration.  Evaluation of Outcomes: Progressing   RN Treatment Plan for Primary Diagnosis: Delusional disorder (HCC) Long Term Goal(s): Knowledge of disease and therapeutic regimen to maintain health will improve  Short Term Goals: Ability to demonstrate self-control, Ability to participate in decision making will improve, Ability to verbalize feelings will  improve, Ability to disclose and discuss suicidal ideas, Ability to identify and develop effective coping behaviors will improve, and Compliance with prescribed medications will improve  Medication Management: RN will administer medications as ordered by  provider, will assess and evaluate patient's response and provide education to patient for prescribed medication. RN will report any adverse and/or side effects to prescribing provider.  Therapeutic Interventions: 1 on 1 counseling sessions, Psychoeducation, Medication administration, Evaluate responses to treatment, Monitor vital signs and CBGs as ordered, Perform/monitor CIWA, COWS, AIMS and Fall Risk screenings as ordered, Perform wound care treatments as ordered.  Evaluation of Outcomes: Progressing   LCSW Treatment Plan for Primary Diagnosis: Delusional disorder [facility]) Long Term Goal(s): Safe transition to appropriate next level of care at discharge, Engage patient in therapeutic group addressing interpersonal concerns.  Short Term Goals: Engage patient in aftercare planning with referrals and resources, Increase social support, Increase ability to appropriately verbalize feelings, Increase emotional regulation, Facilitate acceptance of mental health diagnosis and concerns, and Increase skills for wellness and recovery  Therapeutic Interventions: Assess for all discharge needs, 1 to 1 time with Social worker, Explore available resources and support systems, Assess for adequacy in community support network, Educate family and significant other(s) on suicide prevention, Complete Psychosocial Assessment, Interpersonal group therapy.  Evaluation of Outcomes: Progressing   Progress in Treatment: Attending groups: Yes. Participating in groups: Yes. Taking medication as prescribed: Yes. Toleration medication: Yes. Family/Significant other contact made: Yes, individual(s) contacted:  SPE completed with the patient's brother Patient understands diagnosis: No. Discussing patient identified problems/goals with staff: Yes. Medical problems stabilized or resolved: Yes. Denies suicidal/homicidal ideation: Yes. Issues/concerns per patient self-inventory: No. Other: none  New problem(s) identified:  No, Describe:  05/14/24 Update: None  05/24/24 Update: No changes at this time.  Update 05/29/2024:  No changes at this time.    New Short Term/Long Term Goal(s): elimination of symptoms of psychosis, medication management for mood stabilization; elimination of SI thoughts; development of comprehensive mental wellness plan. Update 04/02/24: No changes at this time. Update 04/07/24: No changes at this time. Update 04/12/24: No changes at this time Update 04/18/24: No changes at this time  Update 04/23/24: No changes at this time. Update 04/28/2024: No changes at this time.  Update 05/04/2024: No changes at this time.  Update 05/09/2024:  No changes at this time. 05/14/24 Update: No changes at this time. 05/19/24 Update: No changes at this time. 05/24/24 Update: No changes at this time. Update 05/29/2024:  No changes at this time.    Patient Goals:  Get out of here, I want to get healed up so I can get out of here Update 04/02/24: No changes at this time. Update 04/07/24: No changes at this time .Update 04/12/24: No changes at this time Update 04/18/24: No changes at this time  Update 04/23/24: No changes at this time. Update 04/28/2024: No changes at this time. Update 05/04/2024: No changes at this time.   Update 05/29/2024:  No changes at this time.   Discharge Plan or Barriers: CSW will assist with appropriate discharge planning  Update 04/02/24: No changes at this time. Update 04/07/24: No changes at this time.Update 04/12/24: No changes at this time Update 04/18/24: CSW submitted report to APS. Care team looking at guardianship for pt at this time  Update 04/23/24: No changes at this time. Update 04/28/2024: No changes at this time. Update 05/04/2024: No changes at this time.  Update 05/09/2024:  [facility] APS continues to search for placement. 05/14/24  Update: [facility] APS continues to look for placement. CSW to continue to assess. 05/19/24 Update: No changes at this time. 05/24/24 Update:DSS continues to look for  placement at this time Update 05/29/2024:  Patient remains on the unit and safe at this time.  Case was accepted by APS [facility] and they are looking for placement for the patient.     Reason for Continuation of Hospitalization: Delusions  Medication stabilization   Estimated Length of Stay: 1 to 7 days Update 04/02/24: TBD. Update 04/07/24: TBD Update 04/12/24:TBD. Update 04/28/24:TBD Update 05/04/2024: TBD Update 05/09/2024:  TBD  05/14/24 Update: TBD. 05/19/24 Update: TBD. 05/24/24 Update: TBD  Update 05/29/2024:  TBD.   Last 3 [place] Suicide Severity Risk Score: Flowsheet Row Admission (Current) from 03/27/2024 in [facility] from 03/26/2024 in [facility] from 03/25/2024 in [facility]  C-SSRS RISK CATEGORY No Risk No Risk No Risk    Last [facility] 2/9 Scores:     No data to display          Scribe for Treatment Team: Sherryle JINNY Paola KEN 05/29/2024 4:33 PM

## 2024-05-29 NOTE — Group Note (Signed)
 Date:  05/29/2024 Time:  8:40 PM  Group Topic/Focus:  Wrap-Up Group:   The focus of this group is to help patients review their daily goal of treatment and discuss progress on daily workbooks.    Participation Level:  Active  Participation Quality:  Appropriate  Affect:  Appropriate  Cognitive:  Appropriate  Insight: Appropriate  Engagement in Group:  Engaged  Modes of Intervention:  Discussion  Additional Comments:    Beatris ONEIDA Hasten 05/29/2024, 8:40 PM

## 2024-05-29 NOTE — Plan of Care (Signed)
  Problem: Education: Goal: Knowledge of General Education information will improve Description: Including pain rating scale, medication(s)/side effects and non-pharmacologic comfort measures Outcome: Progressing   Problem: Health Behavior/Discharge Planning: Goal: Ability to manage health-related needs will improve Outcome: Progressing   Problem: Clinical Measurements: Goal: Ability to maintain clinical measurements within normal limits will improve Outcome: Progressing Goal: Will remain free from infection Outcome: Progressing Goal: Diagnostic test results will improve Outcome: Progressing Goal: Respiratory complications will improve Outcome: Progressing Goal: Cardiovascular complication will be avoided Outcome: Progressing   Problem: Activity: Goal: Risk for activity intolerance will decrease Outcome: Progressing   Problem: Nutrition: Goal: Adequate nutrition will be maintained Outcome: Progressing   Problem: Coping: Goal: Level of anxiety will decrease Outcome: Progressing   Problem: Elimination: Goal: Will not experience complications related to bowel motility Outcome: Progressing Goal: Will not experience complications related to urinary retention Outcome: Progressing   Problem: Pain Managment: Goal: General experience of comfort will improve and/or be controlled Outcome: Progressing   Problem: Safety: Goal: Ability to remain free from injury will improve Outcome: Progressing   Problem: Skin Integrity: Goal: Risk for impaired skin integrity will decrease Outcome: Progressing   Problem: Education: Goal: Knowledge of [place] [facility] Education information/materials will improve Outcome: Progressing Goal: Emotional status will improve Outcome: Progressing Goal: Mental status will improve Outcome: Progressing Goal: Verbalization of understanding the information provided will improve Outcome: Progressing   Problem: Activity: Goal: Interest or  engagement in activities will improve Outcome: Progressing Goal: Sleeping patterns will improve Outcome: Progressing   Problem: Coping: Goal: Ability to verbalize frustrations and anger appropriately will improve Outcome: Progressing Goal: Ability to demonstrate self-control will improve Outcome: Progressing   Problem: Health Behavior/Discharge Planning: Goal: Identification of resources available to assist in meeting health care needs will improve Outcome: Progressing Goal: Compliance with treatment plan for underlying cause of condition will improve Outcome: Progressing   Problem: Physical Regulation: Goal: Ability to maintain clinical measurements within normal limits will improve Outcome: Progressing   Problem: Safety: Goal: Periods of time without injury will increase Outcome: Progressing

## 2024-05-29 NOTE — BHH Counselor (Signed)
 CSW received email from pt's caseworker [name] on 05/28/24 at 4:35 PM reporting that there are currently no updates on placement for pt and she I actively working with [facility] to find placement for pt   CSW will inform care team   Lum Croft, MSW, [facility] 05/29/2024 1:03 PM

## 2024-05-29 NOTE — Group Note (Signed)
 LCSW Group Therapy Note   Group Date: 05/29/2024 Start Time: 1315 End Time: 1400   Type of Therapy and Topic:  Group Therapy: Challenging Core Beliefs  Participation Level:  None  Description of Group:  Patients were educated about core beliefs and asked to identify one harmful core belief that they have. Patients were asked to explore from where those beliefs originate. Patients were asked to discuss how those beliefs make them feel and the resulting behaviors of those beliefs. They were then be asked if those beliefs are true and, if so, what evidence they have to support them. Lastly, group members were challenged to replace those negative core beliefs with helpful beliefs.   Therapeutic Goals:   1. Patient will identify harmful core beliefs and explore the origins of such beliefs. 2. Patient will identify feelings and behaviors that result from those core beliefs. 3. Patient will discuss whether such beliefs are true. 4.  Patient will replace harmful core beliefs with helpful ones.  Summary of Patient Progress:  Benjamin [name] did not actively engaged in processing and exploring how core beliefs are formed and how they impact thoughts, feelings, and behaviors. Patient proved open to input from peers and feedback from CSW. Patient demonstrated no insight into the subject matter, was respectful and supportive of peers, and participated throughout the entire session.  Pt slept throughout group  Therapeutic Modalities: Cognitive Behavioral Therapy; Solution-Focused Therapy   Benjamin [name], [place] 05/29/2024  2:12 PM

## 2024-05-30 DIAGNOSIS — F22 Delusional disorders: Secondary | ICD-10-CM | POA: Diagnosis not present

## 2024-05-30 DIAGNOSIS — F0390 Unspecified dementia without behavioral disturbance: Secondary | ICD-10-CM | POA: Diagnosis not present

## 2024-05-30 NOTE — Progress Notes (Signed)
 Patient is an involuntary admission to [facility] with psychosis and paranoia after jumping from a two story building believing that people were chasing him and going to kill him.  Patient is calm, cooperative, takes his medications with no problems.  On arrival he was wearing a post op boot from a broken foot which has healed and he no longer uses. He has an APS referral and they are working on guardianship.  Patient denies SI, HI, AVH, anxiety and depression.  Patient interacts well with staff and peers and attends groups.

## 2024-05-30 NOTE — Plan of Care (Signed)
  Problem: Education: Goal: Knowledge of General Education information will improve Description: Including pain rating scale, medication(s)/side effects and non-pharmacologic comfort measures Outcome: Progressing   Problem: Health Behavior/Discharge Planning: Goal: Ability to manage health-related needs will improve Outcome: Progressing   Problem: Clinical Measurements: Goal: Ability to maintain clinical measurements within normal limits will improve Outcome: Progressing Goal: Will remain free from infection Outcome: Progressing Goal: Diagnostic test results will improve Outcome: Progressing Goal: Respiratory complications will improve Outcome: Progressing Goal: Cardiovascular complication will be avoided Outcome: Progressing   Problem: Activity: Goal: Risk for activity intolerance will decrease Outcome: Progressing   Problem: Nutrition: Goal: Adequate nutrition will be maintained Outcome: Progressing   Problem: Coping: Goal: Level of anxiety will decrease Outcome: Progressing   Problem: Elimination: Goal: Will not experience complications related to bowel motility Outcome: Progressing Goal: Will not experience complications related to urinary retention Outcome: Progressing   Problem: Pain Managment: Goal: General experience of comfort will improve and/or be controlled Outcome: Progressing   Problem: Safety: Goal: Ability to remain free from injury will improve Outcome: Progressing   Problem: Skin Integrity: Goal: Risk for impaired skin integrity will decrease Outcome: Progressing   Problem: Education: Goal: Knowledge of [place] [facility] Education information/materials will improve Outcome: Progressing Goal: Emotional status will improve Outcome: Progressing Goal: Mental status will improve Outcome: Progressing Goal: Verbalization of understanding the information provided will improve Outcome: Progressing   Problem: Activity: Goal: Interest or  engagement in activities will improve Outcome: Progressing Goal: Sleeping patterns will improve Outcome: Progressing   Problem: Coping: Goal: Ability to verbalize frustrations and anger appropriately will improve Outcome: Progressing Goal: Ability to demonstrate self-control will improve Outcome: Progressing   Problem: Health Behavior/Discharge Planning: Goal: Identification of resources available to assist in meeting health care needs will improve Outcome: Progressing Goal: Compliance with treatment plan for underlying cause of condition will improve Outcome: Progressing   Problem: Physical Regulation: Goal: Ability to maintain clinical measurements within normal limits will improve Outcome: Progressing   Problem: Safety: Goal: Periods of time without injury will increase Outcome: Progressing

## 2024-05-30 NOTE — Progress Notes (Signed)
 [facility] MD Progress Note  05/30/2024 12:16 PM Boy Delamater  MRN:  [id number] Jaydeen Odor is a 78 year old male who presents to the inpatient geriatric psych unit after jumping from a two story building. Patient was originally seen at [facility] who referred him to the ED who admitted him here on 03/27/2024. Patient reports that people from the [place] gang were trying to kill him and the only escape was through the bedroom window on the second floor. He currently lives in the house with [name] and his family and has been living with them for 3-4 years without any problems. On 03/19/2024 he went to the doctor and when he got back home the family wouldn't let him leave. He states that after he realized the gang was going to kill him he barricaded the door with his bed and jumped out the window. When he landed he ran away until one of his neighbors found him and called 911.   Subjective: Chart is reviewed and discussed with the treatment team.  APS is working on legal guardianship and placement.   Patient is noted to be resting in his room.  He offers no complaints.  He reports that the staff is helping him on the unit.  Per nursing he is taking his medications with no reported side effects.  He denies SI/HI/plan and denies any hallucinations.  He is taking his medications with no reported side effects.   Capacity :Able to understand medical problem Patient is unable to display any understanding of his medical problems or the current presentation leading up to admission Able to understand proposed treatment  Patient is unable to understand the proposed treatment with antipsychotics Patient remains paranoid and has cognitive impairment as he is unable to discuss his discharge planning.  Given his paranoia and delay in executive function patient is unable to make decisions regarding the treatment and disposition.    Sleep: Fair  Appetite:  Fair  Past Psychiatric History: see  h&P  Family History:  Family History  Problem Relation Age of Onset   Colon cancer Neg Hx    Colon polyps Neg Hx    Stomach cancer Neg Hx    Esophageal cancer Neg Hx    Social History:  Social History   Substance and Sexual Activity  Alcohol Use No     Social History   Substance and Sexual Activity  Drug Use No    Social History   Socioeconomic History   Marital status: Single    Spouse name: Not on file   Number of children: Not on file   Years of education: Not on file   Highest education level: Not on file  Occupational History   Not on file  Tobacco Use   Smoking status: Former    Types: Cigarettes   Smokeless tobacco: Never  Vaping Use   Vaping status: Never Used  Substance and Sexual Activity   Alcohol use: No   Drug use: No   Sexual activity: Not on file  Other Topics Concern   Not on file  Social History Narrative   Not on file   Social Drivers of Health   Financial Resource Strain: Not on file  Food Insecurity: No Food Insecurity (03/27/2024)   Hunger Vital Sign    Worried About Running Out of Food in the Last Year: Never true    Ran Out of Food in the Last Year: Never true  Transportation Needs: No Transportation Needs (03/27/2024)   PRAPARE -  Administrator, Civil Service (Medical): No    Lack of Transportation (Non-Medical): No  Physical Activity: Not on file  Stress: Not on file  Social Connections: Moderately Integrated (03/27/2024)   Social Connection and Isolation Panel    Frequency of Communication with Friends and Family: Twice a week    Frequency of Social Gatherings with Friends and Family: Once a week    Attends Religious Services: 1 to 4 times per year    Active Member of [employer] or Organizations: No    Attends Engineer, structural: 1 to 4 times per year    Marital Status: Divorced   Past Medical History:  Past Medical History:  Diagnosis Date   Allergy    Arthritis    back   Prostate cancer (HCC) dx'd 2010    surg only    Past Surgical History:  Procedure Laterality Date   COLONOSCOPY     EYE SURGERY     PROSTATE SURGERY      Current Medications: Current Facility-Administered Medications  Medication Dose Route Frequency Provider Last Rate Last Admin   acetaminophen  (TYLENOL ) tablet 650 mg  650 mg Oral Q6H PRN Coleman, Carolyn H, NP       alum & mag hydroxide-simeth (MAALOX/MYLANTA) 200-200-20 MG/5ML suspension 30 mL  30 mL Oral Q4H PRN Coleman, Carolyn H, NP       docusate sodium  (COLACE) capsule 100 mg  100 mg Oral Daily Maryela Tapper, MD   100 mg at 05/30/24 9192   fluPHENAZine  (PROLIXIN ) tablet 10 mg  10 mg Oral BID Shrivastava, Aryendra, MD   10 mg at 05/30/24 9192   Or   fluPHENAZine  (PROLIXIN ) injection 5 mg  5 mg Intramuscular BID Shrivastava, Aryendra, MD       magnesium  hydroxide (MILK OF MAGNESIA) suspension 30 mL  30 mL Oral Daily PRN Mardy Elveria DEL, NP       OLANZapine  (ZYPREXA ) injection 5 mg  5 mg Intramuscular TID PRN Mardy Elveria DEL, NP   5 mg at 04/12/24 2230   OLANZapine  zydis (ZYPREXA ) disintegrating tablet 5 mg  5 mg Oral TID PRN Mardy Elveria DEL, NP       polyethylene glycol (MIRALAX  / GLYCOLAX ) packet 17 g  17 g Oral Daily PRN Donnelly Mellow, MD   17 g at 05/10/24 0830    Lab Results:  No results found for this or any previous visit (from the past 48 hours).    Blood Alcohol level:  Lab Results  Component Value Date   [facility] <15 03/26/2024    Metabolic Disorder Labs: Lab Results  Component Value Date   HGBA1C 5.6 04/02/2024   MPG 114 04/02/2024   No results found for: PROLACTIN Lab Results  Component Value Date   CHOL 146 04/02/2024   TRIG 37 04/02/2024   HDL 43 04/02/2024   CHOLHDL 3.4 04/02/2024   VLDL 7 04/02/2024   LDLCALC 96 04/02/2024    Physical Findings: AIMS:  , ,  ,  ,    CIWA:    COWS:      Psychiatric Specialty Exam:  Presentation  General Appearance:  Appropriate for Environment; Bizarre  Eye  Contact: Fair  Speech: Clear and Coherent  Speech Volume: Normal    Mood and Affect  Mood: Anxious  Affect: Congruent   Thought Process  Thought Processes: Disorganized  Descriptions of Associations:Intact  Orientation:Full (Time, Place and Person)  Thought Content: Paranoia at baseline Hallucinations: Denies  Ideas of Reference: Paranoia  at baseline Suicidal Thoughts: Denies  Homicidal Thoughts: Denies   Sensorium  Memory: Immediate Fair; Recent Fair; Remote Fair  Judgment: Impaired  Insight: Shallow   Executive Functions  Concentration: Fair  Attention Span: Fair  Recall: [employer] of Knowledge: Fair  Language: Fair   Psychomotor Activity  Psychomotor Activity: No data recorded  Musculoskeletal: Strength & Muscle Tone: within normal limits Gait & Station: normal Assets  Assets: Manufacturing systems engineer; Resilience    Physical Exam: Physical Exam Vitals and nursing note reviewed.    ROS Blood pressure 121/71, pulse 65, temperature 98 F (36.7 C), resp. rate 18, height 6' (1.829 m), weight 100 kg, SpO2 99%. Body mass index is 29.91 kg/m.  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  Patient needs to be monitored closely for ongoing psychosis and paranoid delusions.  Given patient's confusion and chronic paranoia, no family support, APS report has been made and it got screened in for further evaluation by APS.   Differential diagnosis include: unspecified psychosis, delusion disorder, dementia, schizophrenia/schizoaffective disorder   Treatment Plan Summary: Requested the social work team to reach out to APS for safety concerns in the community as patient has chronic paranoia  and confusion and is currently homeless patient remains confused and paranoid and spite of being compliant with medications.  Patient displays cognitive delay and deficits in executive function like planning, organizing and processing information.  At this point patient lacks capacity to make medical decisions.  We recommend APS referral for possible legal guardianship as he has no family or legal next of kin to help with decision-making.   Safety and Monitoring:             -- InVoluntary admission to inpatient psychiatric unit for safety, stabilization and treatment             -- Daily contact with patient to assess and evaluate symptoms and progress in treatment             -- Patient's case to be discussed in multi-disciplinary team meeting             -- Observation Level: q15 minute checks             -- Vital signs:  q12 hours             -- Precautions: suicide, elopement, and assault   2. Psychiatric Diagnoses and Treatment:              Continue Prolixin  to 10 mg twice daily. discontinued Zyprexa .  -- The risks/benefits/side-effects/alternatives to this medication were discussed in detail with the patient and time was given for questions. The patient consents to medication trial.                -- Metabolic profile and EKG monitoring obtained while on an atypical antipsychotic (BMI: Lipid Panel: HbgA1c: QTc:)              -- Encouraged patient to participate in unit milieu and in scheduled group therapies                            3. Medical Issues Being Addressed:    No urgent medical needs noted  4. Discharge Planning:   -- Social work and case management to assist with discharge planning and identification of hospital follow-up needs prior to discharge  -- Estimated LOS: 3-4 days  Allyn Foil, MD 05/30/2024, 12:16 PM

## 2024-05-30 NOTE — Progress Notes (Signed)
   05/30/24 2100  Psych Admission Type (Psych Patients Only)  Admission Status Involuntary  Psychosocial Assessment  Patient Complaints None  Eye Contact Brief  Facial Expression Animated  Affect Appropriate to circumstance  Speech Logical/coherent  Interaction Minimal  Motor Activity Slow  Appearance/Hygiene In scrubs  Behavior Characteristics Appropriate to situation;Cooperative  Mood Pleasant  Thought Process  Coherency WDL  Content WDL  Delusions None reported or observed  Perception WDL  Hallucination None reported or observed  Judgment Impaired  Confusion Mild  Danger to Self  Current suicidal ideation? Denies  Self-Injurious Behavior No self-injurious ideation or behavior indicators observed or expressed   Danger to Others  Danger to Others None reported or observed   Mood/Behavior:  Pleasant and cooperative.  Mild confusion.    Psych assessment: Denies SI/HI and AVH.     Interaction / Group attendance:  Present in the milieu. Minimal interaction with peers and staff.     Medication/ PRNs: Compliant with scheduled medications. No PRNs given.   Pain: Denies   15 min checks in place for safety.

## 2024-05-30 NOTE — Group Note (Signed)
 Date:  05/30/2024 Time:  11:07 AM  Group Topic/Focus:  Wellness Toolbox:   The focus of this group is to discuss various aspects of wellness, balancing those aspects and exploring ways to increase the ability to experience wellness.  Patients will create a wellness toolbox for use upon discharge.    Participation Level:  Active  Participation Quality:  Appropriate  Affect:  Appropriate  Cognitive:  Appropriate  Insight: Appropriate  Engagement in Group:  Engaged  Modes of Intervention:  Activity  Additional Comments:  N/A  [name] Gavel 05/30/2024, 11:07 AM

## 2024-05-31 DIAGNOSIS — F0390 Unspecified dementia without behavioral disturbance: Secondary | ICD-10-CM | POA: Diagnosis not present

## 2024-05-31 DIAGNOSIS — F22 Delusional disorders: Secondary | ICD-10-CM | POA: Diagnosis not present

## 2024-05-31 NOTE — B[facility] Counselor (Signed)
 Pt was visited by DSS caseworker [name].   Arch reports that there is no updates on placement at this time.   Lum Croft, MSW, [place] 05/31/2024 3:20 PM

## 2024-05-31 NOTE — Group Note (Signed)
 Recreation Therapy Group Note   Group Topic:Animal Assisted Therapy   Group Date: 05/31/2024 Start Time: 1435 End Time: 1505 Facilitators: Celestia Jeoffrey BRAVO, LRT, CTRS Location: Dayroom  Group Description: AAA. Animal-Assisted Activity provides opportunities for motivational, educational, therapeutic and/or recreational benefits to enhance quality of life. Selinda and Rollo visited the unit to interact with patients.   Goal Areas Addressed:  Reduced anxiety and stress Improved mood Increased social interaction Enhanced communication skills Reduced loneliness and isolation Improved emotional regulation    Affect/Mood: N/A   Participation Level: Did not attend    Clinical Observations/Individualized Feedback: Patient did not attend group.   Plan: Continue to engage patient in RT group sessions 2-3x/week.   Jeoffrey BRAVO Celestia, LRT, CTRS 05/31/2024 4:48 PM

## 2024-05-31 NOTE — Group Note (Signed)
 LCSW Group Therapy Note  Group Date: 05/31/2024 Start Time: 1330 End Time: 1400   Type of Therapy and Topic:  Group Therapy - Healthy vs Unhealthy Coping Skills  Participation Level:  Did Not Attend   Description of Group The focus of this group was to determine what unhealthy coping techniques typically are used by group members and what healthy coping techniques would be helpful in coping with various problems. Patients were guided in becoming aware of the differences between healthy and unhealthy coping techniques. Patients were asked to identify 2-3 healthy coping skills they would like to learn to use more effectively.  Therapeutic Goals Patients learned that coping is what human beings do all day long to deal with various situations in their lives Patients defined and discussed healthy vs unhealthy coping techniques Patients identified their preferred coping techniques and identified whether these were healthy or unhealthy Patients determined 2-3 healthy coping skills they would like to become more familiar with and use more often. Patients provided support and ideas to each other   Summary of Patient Progress: X   Therapeutic Modalities Cognitive Behavioral Therapy Motivational Interviewing  [name], LCSWA 07/04/2024  2:12 PM

## 2024-05-31 NOTE — Progress Notes (Signed)
   05/31/24 1400  Psych Admission Type (Psych Patients Only)  Admission Status Involuntary  Psychosocial Assessment  Patient Complaints None  Eye Contact Avoids  Facial Expression Animated  Affect Appropriate to circumstance  Speech Logical/coherent  Interaction Minimal  Motor Activity Slow  Appearance/Hygiene In scrubs  Behavior Characteristics Cooperative  Mood Pleasant  Thought Process  Coherency WDL  Content WDL  Delusions None reported or observed  Perception WDL  Hallucination None reported or observed  Judgment Impaired  Confusion Mild  Danger to Self  Current suicidal ideation? Denies  Agreement Not to Harm Self Yes  Description of Agreement verbal  Danger to Others  Danger to Others None reported or observed

## 2024-05-31 NOTE — Progress Notes (Signed)
 [facility] MD Progress Note  05/31/2024 10:53 AM Benjamin [name]  MRN:  [id number] Benjamin [name] is a 78 year old male who presents to the inpatient geriatric psych unit after jumping from a two story building. Patient was originally seen at [name] who referred him to the ED who admitted him here on 03/27/2024. Patient reports that people from the [place] gang were trying to kill him and the only escape was through the bedroom window on the second floor. He currently lives in the house with [name] and his family and has been living with them for 3-4 years without any problems. On 03/19/2024 he went to the doctor and when he got back home the family wouldn't let him leave. He states that after he realized the gang was going to kill him he barricaded the door with his bed and jumped out the window. When he landed he ran away until one of his neighbors found him and called 911.   Subjective: Chart is reviewed and discussed with the treatment team.  APS is working on legal guardianship and placement. Today on interview patient is noted to be resting in bed.  He offers no complaints.  Per nursing patient remains visible on the unit and is participating in groups.  He is coming out of his room to have his meals and taking his medications.  He is not endorsing SI/HI/plan and is not responding to internal stimuli.  APS came to visit him today.    Capacity :Able to understand medical problem Patient is unable to display any understanding of his medical problems or the current presentation leading up to admission Able to understand proposed treatment  Patient is unable to understand the proposed treatment with antipsychotics Patient remains paranoid and has cognitive impairment as he is unable to discuss his discharge planning.  Given his paranoia and delay in executive function patient is unable to make decisions regarding the treatment and disposition.    Sleep: Fair  Appetite:   Fair  Past Psychiatric History: see h&P  Family History:  Family History  Problem Relation Age of Onset   Colon cancer Neg Hx    Colon polyps Neg Hx    Stomach cancer Neg Hx    Esophageal cancer Neg Hx    Social History:  Social History   Substance and Sexual Activity  Alcohol Use No     Social History   Substance and Sexual Activity  Drug Use No    Social History   Socioeconomic History   Marital status: Single    Spouse name: Not on file   Number of children: Not on file   Years of education: Not on file   Highest education level: Not on file  Occupational History   Not on file  Tobacco Use   Smoking status: Former    Types: Cigarettes   Smokeless tobacco: Never  Vaping Use   Vaping status: Never Used  Substance and Sexual Activity   Alcohol use: No   Drug use: No   Sexual activity: Not on file  Other Topics Concern   Not on file  Social History Narrative   Not on file   Social Drivers of Health   Financial Resource Strain: Not on file  Food Insecurity: No Food Insecurity (03/27/2024)   Hunger Vital Sign    Worried About Running Out of Food in the Last Year: Never true    Ran Out of Food in the Last Year: Never true  Transportation Needs: No  Transportation Needs (03/27/2024)   PRAPARE - Administrator, Civil Service (Medical): No    Lack of Transportation (Non-Medical): No  Physical Activity: Not on file  Stress: Not on file  Social Connections: Moderately Integrated (03/27/2024)   Social Connection and Isolation Panel    Frequency of Communication with Friends and Family: Twice a week    Frequency of Social Gatherings with Friends and Family: Once a week    Attends Religious Services: 1 to 4 times per year    Active Member of [employer] or Organizations: No    Attends Engineer, structural: 1 to 4 times per year    Marital Status: Divorced   Past Medical History:  Past Medical History:  Diagnosis Date   Allergy    Arthritis    back    Prostate cancer (HCC) dx'd 2010   surg only    Past Surgical History:  Procedure Laterality Date   COLONOSCOPY     EYE SURGERY     PROSTATE SURGERY      Current Medications: Current Facility-Administered Medications  Medication Dose Route Frequency Provider Last Rate Last Admin   acetaminophen  (TYLENOL ) tablet 650 mg  650 mg Oral Q6H PRN Coleman, Carolyn H, NP       alum & mag hydroxide-simeth (MAALOX/MYLANTA) 200-200-20 MG/5ML suspension 30 mL  30 mL Oral Q4H PRN Coleman, Carolyn H, NP       docusate sodium  (COLACE) capsule 100 mg  100 mg Oral Daily Sukhmani Fetherolf, MD   100 mg at 05/31/24 9070   fluPHENAZine  (PROLIXIN ) tablet 10 mg  10 mg Oral BID Shrivastava, Aryendra, MD   10 mg at 05/31/24 9070   Or   fluPHENAZine  (PROLIXIN ) injection 5 mg  5 mg Intramuscular BID Shrivastava, Aryendra, MD       magnesium  hydroxide (MILK OF MAGNESIA) suspension 30 mL  30 mL Oral Daily PRN Mardy Elveria DEL, NP       OLANZapine  (ZYPREXA ) injection 5 mg  5 mg Intramuscular TID PRN Mardy Elveria DEL, NP   5 mg at 04/12/24 2230   OLANZapine  zydis (ZYPREXA ) disintegrating tablet 5 mg  5 mg Oral TID PRN Mardy Elveria DEL, NP       polyethylene glycol (MIRALAX  / GLYCOLAX ) packet 17 g  17 g Oral Daily PRN Donnelly Mellow, MD   17 g at 05/10/24 0830    Lab Results:  No results found for this or any previous visit (from the past 48 hours).    Blood Alcohol level:  Lab Results  Component Value Date   [facility] <15 03/26/2024    Metabolic Disorder Labs: Lab Results  Component Value Date   HGBA1C 5.6 04/02/2024   MPG 114 04/02/2024   No results found for: PROLACTIN Lab Results  Component Value Date   CHOL 146 04/02/2024   TRIG 37 04/02/2024   HDL 43 04/02/2024   CHOLHDL 3.4 04/02/2024   VLDL 7 04/02/2024   LDLCALC 96 04/02/2024    Physical Findings: AIMS:  , ,  ,  ,    CIWA:    COWS:      Psychiatric Specialty Exam:  Presentation  General Appearance:  Appropriate for  Environment; Bizarre  Eye Contact: Fair  Speech: Clear and Coherent  Speech Volume: Normal    Mood and Affect  Mood: Anxious  Affect: Congruent   Thought Process  Thought Processes: Disorganized  Descriptions of Associations:Intact  Orientation:Full (Time, Place and Person)  Thought Content: Paranoia at baseline  Hallucinations: Denies  Ideas of Reference: Paranoia at baseline Suicidal Thoughts: Denies  Homicidal Thoughts: Denies   Sensorium  Memory: Immediate Fair; Recent Fair; Remote Fair  Judgment: Impaired  Insight: Shallow   Executive Functions  Concentration: Fair  Attention Span: Fair  Recall: [employer] of Knowledge: Fair  Language: Fair   Psychomotor Activity  Psychomotor Activity: No data recorded  Musculoskeletal: Strength & Muscle Tone: within normal limits Gait & Station: normal Assets  Assets: Manufacturing systems engineer; Resilience    Physical Exam: Physical Exam Vitals and nursing note reviewed.    ROS Blood pressure 100/72, pulse 70, temperature 97.8 F (36.6 C), resp. rate 18, height 6' (1.829 m), weight 100 kg, SpO2 100%. Body mass index is 29.91 kg/m.  Diagnosis: Principal Problem:   Delusional disorder [name])  Major Neurocognitive disorder  Clinical Decision Making: Patient currently admitted after jumping off a two-story building in the context of possible delusions as reported as the people in the house he lived for 4 years trying to kill him.  Patient needs to be monitored closely for ongoing psychosis and paranoid delusions.  Given patient's confusion and chronic paranoia, no family support, APS report has been made and it got screened in for further evaluation by APS.   Differential diagnosis include: unspecified psychosis, delusion disorder, dementia, schizophrenia/schizoaffective disorder   Treatment Plan Summary: Requested the social work team to reach out to APS for safety concerns in the community  as patient has chronic paranoia and confusion and is currently homeless patient remains confused and paranoid and spite of being compliant with medications.  Patient displays cognitive delay and deficits in executive function like planning, organizing and processing information.  At this point patient lacks capacity to make medical decisions.  We recommend APS referral for possible legal guardianship as he has no family or legal next of kin to help with decision-making.   Safety and Monitoring:             -- InVoluntary admission to inpatient psychiatric unit for safety, stabilization and treatment             -- Daily contact with patient to assess and evaluate symptoms and progress in treatment             -- Patient's case to be discussed in multi-disciplinary team meeting             -- Observation Level: q15 minute checks             -- Vital signs:  q12 hours             -- Precautions: suicide, elopement, and assault   2. Psychiatric Diagnoses and Treatment:              Continue Prolixin  to 10 mg twice daily. discontinued Zyprexa .  -- The risks/benefits/side-effects/alternatives to this medication were discussed in detail with the patient and time was given for questions. The patient consents to medication trial.                -- Metabolic profile and EKG monitoring obtained while on an atypical antipsychotic (BMI: Lipid Panel: HbgA1c: QTc:)              -- Encouraged patient to participate in unit milieu and in scheduled group therapies                            3. Medical Issues Being Addressed:  No urgent medical needs noted  4. Discharge Planning:   -- Social work and case management to assist with discharge planning and identification of hospital follow-up needs prior to discharge  -- Estimated LOS: 3-4 days  Allyn Foil, MD 05/31/2024, 10:53 AM

## 2024-05-31 NOTE — Group Note (Signed)
 Date:  05/31/2024 Time:  11:03 AM  Group Topic/Focus:  Making Healthy Choices:   The focus of this group is to help patients identify negative/unhealthy choices they were using prior to admission and identify positive/healthier coping strategies to replace them upon discharge.    Participation Level:  Did Not Attend   [name] 05/31/2024, 11:03 AM

## 2024-05-31 NOTE — Group Note (Unsigned)
 Date:  05/31/2024 Time:  10:03 AM  Group Topic/Focus:  Goals Group:   The focus of this group is to help patients establish daily goals to achieve during treatment and discuss how the patient can incorporate goal setting into their daily lives to aide in recovery.     Participation Level:  {BHH PARTICIPATION OZCZO:77735}  Participation Quality:  {BHH PARTICIPATION QUALITY:22265}  Affect:  {BHH AFFECT:22266}  Cognitive:  {BHH COGNITIVE:22267}  Insight: {BHH Insight2:20797}  Engagement in Group:  {BHH ENGAGEMENT IN HMNLE:77731}  Modes of Intervention:  {BHH MODES OF INTERVENTION:22269}  Additional Comments:  ***  Benjamin [name] Benjamin [name] 05/31/2024, 10:03 AM

## 2024-05-31 NOTE — Plan of Care (Signed)
   Problem: Education: Goal: Knowledge of General Education information will improve Description Including pain rating scale, medication(s)/side effects and non-pharmacologic comfort measures Outcome: Progressing   Problem: Health Behavior/Discharge Planning: Goal: Ability to manage health-related needs will improve Outcome: Progressing

## 2024-06-01 NOTE — Group Note (Signed)
 Recreation Therapy Group Note   Group Topic:Stress Management  Group Date: 06/01/2024 Start Time: 1400 End Time: 1500 Facilitators: Celestia Jeoffrey BRAVO, LRT, CTRS Location: Courtyard  Group Description: Stress Jenga. LRT and pts played games of Jenga. LRT prompted group discussion on the physical signs and symptoms of stress, similar to the feeling when you're playing Jenga and trying to remove a wooden block from the stack without it all collapsing. LRT and pt discussed the physical and mental signs of stress, as well as coping skills to manage them.   Goal Area(s) Addressed: Patient will identify physical symptoms of stress. Patient will identify coping skills for stress. Patient will build frustration tolerance skills.  Patient will increase communication.   Affect/Mood: N/A   Participation Level: Did not attend    Clinical Observations/Individualized Feedback: Patient did not attend group.   Plan: Continue to engage patient in RT group sessions 2-3x/week.   Jeoffrey BRAVO Celestia, LRT, CTRS 06/01/2024 4:52 PM

## 2024-06-01 NOTE — Group Note (Signed)
 Physical/Occupational Therapy Group Note  Group Topic: Neurographic Art  Group Date: 06/01/2024 Start Time: 1310 End Time: 1410 Facilitators: Clive Warren CROME, OT   Group Description: Group participated with Neurographic art activity, using watercolor paints to facilitate creative expression and meditation/relaxation for each individual.  Incorporated bimanual coordination, mental focus, emotional processing, task/command following and relaxation techniques as appropriate.  Patients engaged socially with therapist and other group participants throughout session. Allowed to ask questions as appropriate, and encouraged to identify ways they could use/share their creations with themselves and others.  Therapeutic Goal(s):  Demonstrate ability to independently manipulate utensils required to participate with and complete activity. Demonstrate ability to cognitively focus on task and follow commands necessary for completion. Demonstrate use of art as an outlet for emotional processing and expression. Identify and demonstrate importance of relaxation, neural calming and meditation for improved participation with life groups.  Individual Participation: Pt did not attend.   Participation Level: Did not attend   Participation Quality:   Behavior:   Speech/Thought Process:   Affect/Mood:   Insight:   Judgement:   Modes of Intervention:   Patient Response to Interventions:    Plan: Continue to engage patient in PT/OT groups 1 - 2x/week.  Tarry Fountain R., MPH, MS, OTR/L ascom [phone] 06/01/24, 4:14 PM

## 2024-06-01 NOTE — Group Note (Signed)
 Date:  06/01/2024 Time:  8:41 PM  Group Topic/Focus:  Healthy Communication:   The focus of this group is to discuss communication, barriers to communication, as well as healthy ways to communicate with others.    Participation Level:  Active  Participation Quality:  Appropriate  Affect:  Appropriate  Cognitive:  Appropriate  Insight: Good  Engagement in Group:  Engaged  Modes of Intervention:  Discussion  Additional Comments:    Benjamin [name] 06/01/2024, 8:41 PM

## 2024-06-01 NOTE — Plan of Care (Signed)
   Problem: Health Behavior/Discharge Planning: Goal: Ability to manage health-related needs will improve Outcome: Progressing   Problem: Education: Goal: Knowledge of General Education information will improve Description: Including pain rating scale, medication(s)/side effects and non-pharmacologic comfort measures Outcome: Progressing

## 2024-06-01 NOTE — Group Note (Signed)
 Date:  06/01/2024 Time:  1:30 PM  Group Topic/Focus:  Goals Group:   The focus of this group is to help patients establish daily goals to achieve during treatment and discuss how the patient can incorporate goal setting into their daily lives to aide in recovery.    Participation Level:  Did Not Attend  Participation Quality:    Affect:    Cognitive:    Insight:   Engagement in Group:    Modes of Intervention:    Additional Comments:    Bridget  l Kyrin Garn 06/01/2024, 1:30 PM

## 2024-06-01 NOTE — Plan of Care (Signed)
  Problem: Education: Goal: Knowledge of General Education information will improve Description: Including pain rating scale, medication(s)/side effects and non-pharmacologic comfort measures Outcome: Progressing   Problem: Clinical Measurements: Goal: Cardiovascular complication will be avoided Outcome: Progressing   Problem: Coping: Goal: Level of anxiety will decrease Outcome: Progressing   

## 2024-06-01 NOTE — Progress Notes (Signed)
   06/01/24 0321  Psych Admission Type (Psych Patients Only)  Admission Status Involuntary  Psychosocial Assessment  Patient Complaints None  Eye Contact Fair  Facial Expression Animated  Affect Appropriate to circumstance  Speech Logical/coherent  Interaction Minimal  Motor Activity Slow  Appearance/Hygiene In scrubs  Behavior Characteristics Cooperative  Mood Pleasant  Thought Process  Coherency WDL  Content WDL  Delusions None reported or observed  Perception WDL  Hallucination None reported or observed  Judgment Impaired  Confusion Mild  Danger to Self  Current suicidal ideation? Denies  Description of Suicide Plan denies  Self-Injurious Behavior No self-injurious ideation or behavior indicators observed or expressed   Agreement Not to Harm Self Yes  Description of Agreement verbal

## 2024-06-01 NOTE — Progress Notes (Signed)
 [facility] MD Progress Note  06/01/2024 11:39 AM Benjamin [name]  MRN:  [id number] Benjamin [name] is a 78 year old male who presents to the inpatient geriatric psych unit after jumping from a two story building. Patient was originally seen at [facility] who referred him to the ED who admitted him here on 03/27/2024. Patient reports that people from the [place] gang were trying to kill him and the only escape was through the bedroom window on the second floor. He currently lives in the house with [name] and his family and has been living with them for 3-4 years without any problems. On 03/19/2024 he went to the doctor and when he got back home the family wouldn't let him leave. He states that after he realized the gang was going to kill him he barricaded the door with his bed and jumped out the window. When he landed he ran away until one of his neighbors found him and called 911.   Subjective: Chart is reviewed and discussed with the treatment team.  APS is working on legal guardianship and placement.  Patient is noted to be sitting in his bed.  He offers no complaints.  He denies SI/HI/plan.  Per nursing patient has no behavioral problems and is able to attend to his ADLs on prompting.  He is taking his medications with no reported side effects.  He is not responding to internal stimuli.   Capacity :Able to understand medical problem Patient is unable to display any understanding of his medical problems or the current presentation leading up to admission Able to understand proposed treatment  Patient is unable to understand the proposed treatment with antipsychotics Patient remains paranoid and has cognitive impairment as he is unable to discuss his discharge planning.  Given his paranoia and delay in executive function patient is unable to make decisions regarding the treatment and disposition.    Sleep: Fair  Appetite:  Fair  Past Psychiatric History: see h&P  Family History:   Family History  Problem Relation Age of Onset   Colon cancer Neg Hx    Colon polyps Neg Hx    Stomach cancer Neg Hx    Esophageal cancer Neg Hx    Social History:  Social History   Substance and Sexual Activity  Alcohol Use No     Social History   Substance and Sexual Activity  Drug Use No    Social History   Socioeconomic History   Marital status: Single    Spouse name: Not on file   Number of children: Not on file   Years of education: Not on file   Highest education level: Not on file  Occupational History   Not on file  Tobacco Use   Smoking status: Former    Types: Cigarettes   Smokeless tobacco: Never  Vaping Use   Vaping status: Never Used  Substance and Sexual Activity   Alcohol use: No   Drug use: No   Sexual activity: Not on file  Other Topics Concern   Not on file  Social History Narrative   Not on file   Social Drivers of Health   Financial Resource Strain: Not on file  Food Insecurity: No Food Insecurity (03/27/2024)   Hunger Vital Sign    Worried About Running Out of Food in the Last Year: Never true    Ran Out of Food in the Last Year: Never true  Transportation Needs: No Transportation Needs (03/27/2024)   PRAPARE - Transportation  Lack of Transportation (Medical): No    Lack of Transportation (Non-Medical): No  Physical Activity: Not on file  Stress: Not on file  Social Connections: Moderately Integrated (03/27/2024)   Social Connection and Isolation Panel    Frequency of Communication with Friends and Family: Twice a week    Frequency of Social Gatherings with Friends and Family: Once a week    Attends Religious Services: 1 to 4 times per year    Active Member of [employer] or Organizations: No    Attends Engineer, structural: 1 to 4 times per year    Marital Status: Divorced   Past Medical History:  Past Medical History:  Diagnosis Date   Allergy    Arthritis    back   Prostate cancer (HCC) dx'd 2010   surg only    Past  Surgical History:  Procedure Laterality Date   COLONOSCOPY     EYE SURGERY     PROSTATE SURGERY      Current Medications: Current Facility-Administered Medications  Medication Dose Route Frequency Provider Last Rate Last Admin   acetaminophen  (TYLENOL ) tablet 650 mg  650 mg Oral Q6H PRN Coleman, Carolyn H, NP       alum & mag hydroxide-simeth (MAALOX/MYLANTA) 200-200-20 MG/5ML suspension 30 mL  30 mL Oral Q4H PRN Coleman, Carolyn H, NP       docusate sodium  (COLACE) capsule 100 mg  100 mg Oral Daily Demarus Latterell, MD   100 mg at 06/01/24 9042   fluPHENAZine  (PROLIXIN ) tablet 10 mg  10 mg Oral BID Shrivastava, Aryendra, MD   10 mg at 06/01/24 9042   Or   fluPHENAZine  (PROLIXIN ) injection 5 mg  5 mg Intramuscular BID Shrivastava, Aryendra, MD       magnesium  hydroxide (MILK OF MAGNESIA) suspension 30 mL  30 mL Oral Daily PRN Mardy Elveria DEL, NP       OLANZapine  (ZYPREXA ) injection 5 mg  5 mg Intramuscular TID PRN Mardy Elveria DEL, NP   5 mg at 04/12/24 2230   OLANZapine  zydis (ZYPREXA ) disintegrating tablet 5 mg  5 mg Oral TID PRN Mardy Elveria DEL, NP       polyethylene glycol (MIRALAX  / GLYCOLAX ) packet 17 g  17 g Oral Daily PRN Donnelly Mellow, MD   17 g at 05/10/24 0830    Lab Results:  No results found for this or any previous visit (from the past 48 hours).    Blood Alcohol level:  Lab Results  Component Value Date   [facility] <15 03/26/2024    Metabolic Disorder Labs: Lab Results  Component Value Date   HGBA1C 5.6 04/02/2024   MPG 114 04/02/2024   No results found for: PROLACTIN Lab Results  Component Value Date   CHOL 146 04/02/2024   TRIG 37 04/02/2024   HDL 43 04/02/2024   CHOLHDL 3.4 04/02/2024   VLDL 7 04/02/2024   LDLCALC 96 04/02/2024    Physical Findings: AIMS:  , ,  ,  ,    CIWA:    COWS:      Psychiatric Specialty Exam:  Presentation  General Appearance:  Appropriate for Environment; Bizarre  Eye Contact: Fair  Speech: Clear and  Coherent  Speech Volume: Normal    Mood and Affect  Mood: Anxious  Affect: Congruent   Thought Process  Thought Processes: Disorganized  Descriptions of Associations:Intact  Orientation:Full (Time, Place and Person)  Thought Content: Paranoia at baseline Hallucinations: Denies  Ideas of Reference: Paranoia at baseline Suicidal Thoughts:  Denies  Homicidal Thoughts: Denies   Sensorium  Memory: impiared  Judgment: Impaired  Insight: Community education officer  Concentration: Fair  Attention Span: Fair  Recall: [employer] of Knowledge: Fair  Language: Fair   Psychomotor Activity  Psychomotor Activity: No data recorded  Musculoskeletal: Strength & Muscle Tone: within normal limits Gait & Station: normal Assets  Assets: Manufacturing systems engineer; Resilience    Physical Exam: Physical Exam Vitals and nursing note reviewed.    ROS Blood pressure (!) 154/90, pulse 63, temperature 97.6 F (36.4 C), resp. rate 15, height 6' (1.829 m), weight 100 kg, SpO2 100%. Body mass index is 29.91 kg/m.  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  Patient needs to be monitored closely for ongoing psychosis and paranoid delusions.  Given patient's confusion and chronic paranoia, no family support, APS report has been made and it got screened in for further evaluation by APS.   Differential diagnosis include: unspecified psychosis, delusion disorder, dementia, schizophrenia/schizoaffective disorder   Treatment Plan Summary: Requested the social work team to reach out to APS for safety concerns in the community as patient has chronic paranoia and confusion and is currently homeless patient remains  confused and paranoid and spite of being compliant with medications.  Patient displays cognitive delay and deficits in executive function like planning, organizing and processing information.  At this point patient lacks capacity to make medical decisions.  We recommend APS referral for possible legal guardianship as he has no family or legal next of kin to help with decision-making.   Safety and Monitoring:             -- InVoluntary admission to inpatient psychiatric unit for safety, stabilization and treatment             -- Daily contact with patient to assess and evaluate symptoms and progress in treatment             -- Patient's case to be discussed in multi-disciplinary team meeting             -- Observation Level: q15 minute checks             -- Vital signs:  q12 hours             -- Precautions: suicide, elopement, and assault   2. Psychiatric Diagnoses and Treatment:              Continue Prolixin  to 10 mg twice daily. discontinued Zyprexa .  -- The risks/benefits/side-effects/alternatives to this medication were discussed in detail with the patient and time was given for questions. The patient consents to medication trial.                -- Metabolic profile and EKG monitoring obtained while on an atypical antipsychotic (BMI: Lipid Panel: HbgA1c: QTc:)              -- Encouraged patient to participate in unit milieu and in scheduled group therapies                            3. Medical Issues Being Addressed:    No urgent medical needs noted  4. Discharge Planning:   -- Social work  and case management to assist with discharge planning and identification of hospital follow-up needs prior to discharge  -- Estimated LOS: 3-4 days  Aimar Shrewsbury, MD 06/01/2024, 11:39 AM

## 2024-06-02 DIAGNOSIS — F22 Delusional disorders: Secondary | ICD-10-CM | POA: Diagnosis not present

## 2024-06-02 DIAGNOSIS — F0390 Unspecified dementia without behavioral disturbance: Secondary | ICD-10-CM | POA: Diagnosis not present

## 2024-06-02 NOTE — Plan of Care (Signed)
   Problem: Activity: Goal: Risk for activity intolerance will decrease Outcome: Progressing   Problem: Nutrition: Goal: Adequate nutrition will be maintained Outcome: Progressing

## 2024-06-02 NOTE — Progress Notes (Signed)
   06/02/24 1100  Psych Admission Type (Psych Patients Only)  Admission Status Involuntary  Psychosocial Assessment  Patient Complaints None  Eye Contact Brief  Facial Expression Animated  Affect Appropriate to circumstance  Speech Logical/coherent  Interaction Minimal  Motor Activity Slow  Appearance/Hygiene In scrubs  Behavior Characteristics Cooperative  Mood Pleasant  Thought Process  Coherency WDL  Content WDL  Delusions None reported or observed  Perception WDL  Hallucination None reported or observed  Judgment Impaired  Confusion Mild  Danger to Self  Current suicidal ideation? Denies  Agreement Not to Harm Self Yes  Description of Agreement verbal  Danger to Others  Danger to Others None reported or observed

## 2024-06-02 NOTE — Group Note (Signed)
 Date:  06/02/2024 Time:  11:37 AM  Group Topic/Focus:  Goals Group:   The focus of this group is to help patients establish daily goals to achieve during treatment and discuss how the patient can incorporate goal setting into their daily lives to aide in recovery.    Participation Level:  Minimal  Participation Quality:  Inattentive  Affect:  Flat and Not Congruent  Cognitive:  Alert and Confused  Insight: Limited  Engagement in Group:  Engaged and Limited  Modes of Intervention:  Activity  Additional Comments:     Maglione,Myah Guynes E 06/02/2024, 11:37 AM

## 2024-06-02 NOTE — Group Note (Signed)
 Date:  06/02/2024 Time:  5:02 PM  Group Topic/Focus:  Thank fulness - List 2 things that you are thankful for.    Participation Level:  Active  Participation Quality:  Appropriate  Affect:  Appropriate  Cognitive:  Appropriate and Oriented  Insight: Appropriate  Engagement in Group:  Engaged  Modes of Intervention:  Discussion  Additional Comments:    [name] 06/02/2024, 5:02 PM
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
## 2024-06-02 NOTE — Plan of Care (Signed)
  Problem: Education: Goal: Knowledge of General Education information will improve Description: Including pain rating scale, medication(s)/side effects and non-pharmacologic comfort measures Outcome: Progressing   Problem: Health Behavior/Discharge Planning: Goal: Ability to manage health-related needs will improve Outcome: Progressing   Problem: Clinical Measurements: Goal: Ability to maintain clinical measurements within normal limits will improve Outcome: Progressing Goal: Will remain free from infection Outcome: Progressing Goal: Diagnostic test results will improve Outcome: Progressing Goal: Respiratory complications will improve Outcome: Progressing Goal: Cardiovascular complication will be avoided Outcome: Progressing   Problem: Activity: Goal: Risk for activity intolerance will decrease Outcome: Progressing   Problem: Nutrition: Goal: Adequate nutrition will be maintained Outcome: Progressing   Problem: Coping: Goal: Level of anxiety will decrease Outcome: Progressing   Problem: Elimination: Goal: Will not experience complications related to bowel motility Outcome: Progressing Goal: Will not experience complications related to urinary retention Outcome: Progressing   Problem: Pain Managment: Goal: General experience of comfort will improve and/or be controlled Outcome: Progressing   Problem: Safety: Goal: Ability to remain free from injury will improve Outcome: Progressing   Problem: Skin Integrity: Goal: Risk for impaired skin integrity will decrease Outcome: Progressing   Problem: Education: Goal: Knowledge of [place] [facility] Education information/materials will improve Outcome: Progressing Goal: Emotional status will improve Outcome: Progressing Goal: Mental status will improve Outcome: Progressing Goal: Verbalization of understanding the information provided will improve Outcome: Progressing   Problem: Activity: Goal: Interest or  engagement in activities will improve Outcome: Progressing Goal: Sleeping patterns will improve Outcome: Progressing   Problem: Coping: Goal: Ability to verbalize frustrations and anger appropriately will improve Outcome: Progressing Goal: Ability to demonstrate self-control will improve Outcome: Progressing   Problem: Health Behavior/Discharge Planning: Goal: Identification of resources available to assist in meeting health care needs will improve Outcome: Progressing Goal: Compliance with treatment plan for underlying cause of condition will improve Outcome: Progressing   Problem: Physical Regulation: Goal: Ability to maintain clinical measurements within normal limits will improve Outcome: Progressing   Problem: Safety: Goal: Periods of time without injury will increase Outcome: Progressing

## 2024-06-03 DIAGNOSIS — F0390 Unspecified dementia without behavioral disturbance: Secondary | ICD-10-CM | POA: Diagnosis not present

## 2024-06-03 DIAGNOSIS — F22 Delusional disorders: Secondary | ICD-10-CM | POA: Diagnosis not present

## 2024-06-03 NOTE — Group Note (Signed)
 LCSW Group Therapy Note  Group Date: 06/03/2024 Start Time: 1430 End Time: 1530   Type of Therapy and Topic:  Group Therapy - Healthy vs Unhealthy Coping Skills  Participation Level:  Did Not Attend   Description of Group The focus of this group was to determine what unhealthy coping techniques typically are used by group members and what healthy coping techniques would be helpful in coping with various problems. Patients were guided in becoming aware of the differences between healthy and unhealthy coping techniques. Patients were asked to identify 2-3 healthy coping skills they would like to learn to use more effectively.  Therapeutic Goals Patients learned that coping is what human beings do all day long to deal with various situations in their lives Patients defined and discussed healthy vs unhealthy coping techniques Patients identified their preferred coping techniques and identified whether these were healthy or unhealthy Patients determined 2-3 healthy coping skills they would like to become more familiar with and use more often. Patients provided support and ideas to each other   Summary of Patient Progress:  The patient did not attend group.   Therapeutic Modalities Cognitive Behavioral Therapy   Sharah Finnell S Teandra Harlan, LCSWA 06/03/2024  3:36 PM

## 2024-06-03 NOTE — Progress Notes (Signed)
   06/02/24 2300  Psych Admission Type (Psych Patients Only)  Admission Status Involuntary  Psychosocial Assessment  Patient Complaints None  Eye Contact Brief  Facial Expression Animated  Affect Appropriate to circumstance  Speech Logical/coherent  Interaction Minimal  Motor Activity Slow  Appearance/Hygiene In scrubs;Unremarkable  Behavior Characteristics Appropriate to situation;Cooperative  Mood Pleasant  Thought Process  Coherency WDL  Content WDL  Delusions None reported or observed  Perception WDL  Hallucination None reported or observed  Judgment Limited  Confusion Mild  Danger to Self  Current suicidal ideation? Denies  Agreement Not to Harm Self Yes  Description of Agreement Verbal  Danger to Others  Danger to Others None reported or observed
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
## 2024-06-03 NOTE — Progress Notes (Signed)
(  Sleep Hours) -7.75 (Any PRNs that were needed, meds refused, or side effects to meds)- N/A (Any disturbances and when (visitation, over night)-N/A (Concerns raised by the patient)- N/A (SI/HI/AVH)-Denies

## 2024-06-03 NOTE — Progress Notes (Signed)
   06/03/24 0800  Psych Admission Type (Psych Patients Only)  Admission Status Involuntary  Psychosocial Assessment  Patient Complaints None  Eye Contact Brief  Facial Expression Animated  Affect Appropriate to circumstance  Speech Logical/coherent  Interaction Minimal  Motor Activity Slow  Appearance/Hygiene In scrubs  Behavior Characteristics Cooperative  Mood Pleasant  Thought Process  Coherency WDL  Content WDL  Delusions None reported or observed  Perception WDL  Hallucination None reported or observed  Judgment Impaired  Confusion Mild  Danger to Self  Current suicidal ideation? Denies  Agreement Not to Harm Self Yes  Description of Agreement verbal  Danger to Others  Danger to Others None reported or observed

## 2024-06-03 NOTE — Plan of Care (Signed)
   Problem: Education: Goal: Knowledge of General Education information will improve Description Including pain rating scale, medication(s)/side effects and non-pharmacologic comfort measures Outcome: Progressing   Problem: Health Behavior/Discharge Planning: Goal: Ability to manage health-related needs will improve Outcome: Progressing

## 2024-06-03 NOTE — Plan of Care (Signed)

## 2024-06-03 NOTE — BH IP Treatment Plan (Signed)
 Interdisciplinary Treatment and Diagnostic Plan Update  06/03/2024 Time of Session: 11;45 am Brand Siever MRN: [id number]  Principal Diagnosis: Delusional disorder [facility])  Secondary Diagnoses: Principal Problem:   Delusional disorder (HCC)   Current Medications:  Current Facility-Administered Medications  Medication Dose Route Frequency Provider Last Rate Last Admin   acetaminophen  (TYLENOL ) tablet 650 mg  650 mg Oral Q6H PRN Coleman, Carolyn H, NP       alum & mag hydroxide-simeth (MAALOX/MYLANTA) 200-200-20 MG/5ML suspension 30 mL  30 mL Oral Q4H PRN Coleman, Carolyn H, NP       docusate sodium  (COLACE) capsule 100 mg  100 mg Oral Daily Jadapalle, Sree, MD   100 mg at 06/03/24 9061   fluPHENAZine  (PROLIXIN ) tablet 10 mg  10 mg Oral BID Shrivastava, Aryendra, MD   10 mg at 06/03/24 9061   Or   fluPHENAZine  (PROLIXIN ) injection 5 mg  5 mg Intramuscular BID Shrivastava, Aryendra, MD       magnesium  hydroxide (MILK OF MAGNESIA) suspension 30 mL  30 mL Oral Daily PRN Coleman, Carolyn H, NP       OLANZapine  (ZYPREXA ) injection 5 mg  5 mg Intramuscular TID PRN Mardy Elveria DEL, NP   5 mg at 04/12/24 2230   OLANZapine  zydis (ZYPREXA ) disintegrating tablet 5 mg  5 mg Oral TID PRN Coleman, Carolyn H, NP       polyethylene glycol (MIRALAX  / GLYCOLAX ) packet 17 g  17 g Oral Daily PRN Jadapalle, Sree, MD   17 g at 05/10/24 0830   PTA Medications: Medications Prior to Admission  Medication Sig Dispense Refill Last Dose/Taking   docusate sodium  (COLACE) 100 MG capsule Take 100 mg by mouth daily.      omega-3 acid ethyl esters (LOVAZA) 1 g capsule Take 1 g by mouth daily.       Patient Stressors: Traumatic event    Patient Strengths: Communication skills   Treatment Modalities: Medication Management, Group therapy, Case management,  1 to 1 session with clinician, Psychoeducation, Recreational therapy.   Physician Treatment Plan for Primary Diagnosis: Delusional disorder [facility]) Long  Term Goal(s): Improvement in symptoms so as ready for discharge   Short Term Goals: Ability to identify changes in lifestyle to reduce recurrence of condition will improve Ability to verbalize feelings will improve Ability to disclose and discuss suicidal ideas Ability to demonstrate self-control will improve Ability to identify and develop effective coping behaviors will improve  Medication Management: Evaluate patient's response, side effects, and tolerance of medication regimen.  Therapeutic Interventions: 1 to 1 sessions, Unit Group sessions and Medication administration.  Evaluation of Outcomes: Progressing  Physician Treatment Plan for Secondary Diagnosis: Principal Problem:   Delusional disorder (HCC)  Long Term Goal(s): Improvement in symptoms so as ready for discharge   Short Term Goals: Ability to identify changes in lifestyle to reduce recurrence of condition will improve Ability to verbalize feelings will improve Ability to disclose and discuss suicidal ideas Ability to demonstrate self-control will improve Ability to identify and develop effective coping behaviors will improve     Medication Management: Evaluate patient's response, side effects, and tolerance of medication regimen.  Therapeutic Interventions: 1 to 1 sessions, Unit Group sessions and Medication administration.  Evaluation of Outcomes: Progressing   RN Treatment Plan for Primary Diagnosis: Delusional disorder (HCC) Long Term Goal(s): Knowledge of disease and therapeutic regimen to maintain health will improve  Short Term Goals: Ability to remain free from injury will improve, Ability to verbalize frustration and anger appropriately will  improve, Ability to demonstrate self-control, Ability to participate in decision making will improve, Ability to verbalize feelings will improve, Ability to disclose and discuss suicidal ideas, Ability to identify and develop effective coping behaviors will improve, and  Compliance with prescribed medications will improve  Medication Management: RN will administer medications as ordered by provider, will assess and evaluate patient's response and provide education to patient for prescribed medication. RN will report any adverse and/or side effects to prescribing provider.  Therapeutic Interventions: 1 on 1 counseling sessions, Psychoeducation, Medication administration, Evaluate responses to treatment, Monitor vital signs and CBGs as ordered, Perform/monitor CIWA, COWS, AIMS and Fall Risk screenings as ordered, Perform wound care treatments as ordered.  Evaluation of Outcomes: Progressing   LCSW Treatment Plan for Primary Diagnosis: Delusional disorder [facility]) Long Term Goal(s): Safe transition to appropriate next level of care at discharge, Engage patient in therapeutic group addressing interpersonal concerns.  Short Term Goals: Engage patient in aftercare planning with referrals and resources, Increase social support, Increase ability to appropriately verbalize feelings, Increase emotional regulation, Facilitate acceptance of mental health diagnosis and concerns, and Increase skills for wellness and recovery  Therapeutic Interventions: Assess for all discharge needs, 1 to 1 time with Social worker, Explore available resources and support systems, Assess for adequacy in community support network, Educate family and significant other(s) on suicide prevention, Complete Psychosocial Assessment, Interpersonal group therapy.  Evaluation of Outcomes: Progressing   Progress in Treatment: Attending groups: No. Participating in groups: No. Taking medication as prescribed: Yes. Toleration medication: Yes. Family/Significant other contact made: Yes, individual(s) contacted:  [name], borther, [phone]  Patient understands diagnosis: No. Discussing patient identified problems/goals with staff: Yes. Medical problems stabilized or resolved: Yes. Denies  suicidal/homicidal ideation: Yes. Issues/concerns per patient self-inventory: No. Other: None  New problem(s) identified: No, Describe:  05/14/24 Update: None  05/24/24 Update: No changes at this time.  Update 05/29/2024:  No changes at this time.  Update 06/03/2024:  No changes at this time.    New Short Term/Long Term Goal(s): elimination of symptoms of psychosis, medication management for mood stabilization; elimination of SI thoughts; development of comprehensive mental wellness plan. Update 04/02/24: No changes at this time. Update 04/07/24: No changes at this time. Update 04/12/24: No changes at this time Update 04/18/24: No changes at this time  Update 04/23/24: No changes at this time. Update 04/28/2024: No changes at this time.  Update 05/04/2024: No changes at this time.  Update 05/09/2024:  No changes at this time. 05/14/24 Update: No changes at this time. 05/19/24 Update: No changes at this time. 05/24/24 Update: No changes at this time. Update 05/29/2024:  No changes at this time. Update 06/03/2024:  No changes at this time.    Patient Goals:  Get out of here, I want to get healed up so I can get out of here Update 04/02/24: No changes at this time. Update 04/07/24: No changes at this time .Update 04/12/24: No changes at this time Update 04/18/24: No changes at this time  Update 04/23/24: No changes at this time. Update 04/28/2024: No changes at this time. Update 05/04/2024: No changes at this time.   Update 05/29/2024:  No changes at this time.  Update 06/03/2024:  No changes at this time.    Discharge Plan or Barriers: CSW will assist with appropriate discharge planning  Update 04/02/24: No changes at this time. Update 04/07/24: No changes at this time.Update 04/12/24: No changes at this time Update 04/18/24: CSW submitted report to APS. Care  team looking at guardianship for pt at this time  Update 04/23/24: No changes at this time. Update 04/28/2024: No changes at this time. Update 05/04/2024: No changes at this time.   Update 05/09/2024:  [facility] APS continues to search for placement. 05/14/24 Update: [name] APS continues to look for placement. CSW to continue to assess. 05/19/24 Update: No changes at this time. 05/24/24 Update:DSS continues to look for placement at this time Update 05/29/2024:  Patient remains on the unit and safe at this time.  Case was accepted by APS [facility] and they are looking for placement for the patient.   Update 06/03/2024:  No changes at this time.    Reason for Continuation of Hospitalization: Delusions  Medication stabilization   Estimated Length of Stay: 1 to 7 days Update 04/02/24: TBD. Update 04/07/24: TBD Update 04/12/24:TBD. Update 04/28/24:TBD Update 05/04/2024: TBD Update 05/09/2024:  TBD  05/14/24 Update: TBD. 05/19/24 Update: TBD. 05/24/24 Update: TBD  Update 05/29/2024:  TBD.  Update 06/03/2024:  TBD  Last 3 [place] Suicide Severity Risk Score: Flowsheet Row Admission (Current) from 03/27/2024 in [name] BEHAVIORAL MEDICINE ED from 03/26/2024 in [facility] Emergency Department at [facility] ED from 03/25/2024 in [facility]  C-SSRS RISK CATEGORY No Risk No Risk No Risk    Last [name] 2/9 Scores:     No data to display          Scribe for Treatment Team: Roselyn GORMAN Lento, LCSW 06/03/2024 11:45 AM

## 2024-06-03 NOTE — Group Note (Signed)
 Date:  06/03/2024 Time:  10:58 AM  Group Topic/Focus:  Goals/Love Languages Group:   The focus of this group is to help patients establish daily goals to achieve during treatment and discuss how the patient can incorporate goal setting into their daily lives to aide in recovery. Also patients learned about the different loves languages and identified which love language traits were theirs,     Participation Level:  Did Not Attend  Beatris ONEIDA Hasten 06/03/2024, 10:58 AM

## 2024-06-03 NOTE — Group Note (Signed)
 Date:  06/03/2024 Time:  10:53 PM  Group Topic/Focus:  Wrap-Up Group:   The focus of this group is to help patients review their daily goal of treatment and discuss progress on daily workbooks.    Participation Level:  Active  Participation Quality:  Appropriate  Affect:  Appropriate  Cognitive:  Alert  Insight: Appropriate  Engagement in Group:  Engaged  Modes of Intervention:  Discussion  Additional Comments:    Benjamin Benjamin [name] 06/03/2024, 10:53 PM

## 2024-06-03 NOTE — Group Note (Signed)
 Date:  06/03/2024 Time:  6:56 PM  Group Topic/Focus:  Rediscovering Joy:   The focus of this group is to explore various ways to relieve stress in a positive manner.    Participation Level:  Active  Participation Quality:  Appropriate  Affect:  Appropriate  Cognitive:  Appropriate  Insight: Appropriate  Engagement in Group:  Engaged  Modes of Intervention:  Activity  Additional Comments:  N/A  [name] Gavel 06/03/2024, 6:56 PM

## 2024-06-04 DIAGNOSIS — F0390 Unspecified dementia without behavioral disturbance: Secondary | ICD-10-CM | POA: Diagnosis not present

## 2024-06-04 DIAGNOSIS — F22 Delusional disorders: Secondary | ICD-10-CM | POA: Diagnosis not present

## 2024-06-04 NOTE — Group Note (Signed)
 Physical/Occupational Therapy Group Note  Group Topic: Yoga  Group Date: 06/04/2024 Start Time: 1300 End Time: 1330 Facilitators: Vedant Shehadeh, Alm Hamilton, PT   Group Description: Group participated with series of yoga poses, designed to emphasize functional standing balance, core stability, generalized flexibility and overall posture.  Incorporated deep breathing techniques with poses, working to promote relaxation, mindfulness and focus with targeted activities.   Discussed benefits of yoga in improving mood and self-esteem, reducing stress and anxiety, and promoting functional strength and balance for each participant.  Discussed ways to integrate into each participant's daily routine.  Provided handout with written and pictorial descriptions of included yoga movements to be utilized as appropriate outside of group time.  Therapeutic Goal(s):  Demonstrate safe ability to participate with yoga poses during group activity. Identify one benefit of participation with yoga poses as part of each participant's exercise/movement routine. Identify 1-2 individual poses that participant feels most beneficial to his/her needs and that he/she can easily replicate outside of group.  Individual Participation: Did not attend  Participation Level:   Participation Quality:   Behavior:   Speech/Thought Process:   Affect/Mood:   Insight:   Judgement:   Modes of Intervention:   Patient Response to Interventions:    Plan: Continue to engage patient in PT/OT groups 1 - 2x/week.  CHARM Hamilton Bertin PT, DPT 06/04/24, 2:07 PM

## 2024-06-04 NOTE — Progress Notes (Signed)
   06/03/24 2032  Psych Admission Type (Psych Patients Only)  Admission Status Involuntary  Psychosocial Assessment  Patient Complaints None  Eye Contact Brief  Facial Expression Animated  Affect Appropriate to circumstance  Speech Logical/coherent  Interaction Minimal  Motor Activity Slow  Appearance/Hygiene In scrubs  Behavior Characteristics Cooperative  Mood Pleasant  Aggressive Behavior  Effect No apparent injury  Thought Process  Coherency WDL  Content WDL  Delusions None reported or observed  Perception WDL  Hallucination None reported or observed  Judgment Impaired  Confusion Mild  Danger to Self  Current suicidal ideation? Denies  Agreement Not to Harm Self Yes  Description of Agreement Verbal  Danger to Others  Danger to Others None reported or observed

## 2024-06-04 NOTE — Plan of Care (Signed)
   Problem: Activity: Goal: Risk for activity intolerance will decrease Outcome: Progressing   Problem: Nutrition: Goal: Adequate nutrition will be maintained Outcome: Progressing

## 2024-06-04 NOTE — Progress Notes (Signed)
   06/04/24 1600  Psych Admission Type (Psych Patients Only)  Admission Status Involuntary  Psychosocial Assessment  Patient Complaints None  Eye Contact Brief  Facial Expression Animated  Affect Appropriate to circumstance  Speech Logical/coherent  Interaction Minimal  Motor Activity Slow  Appearance/Hygiene In scrubs  Behavior Characteristics Cooperative  Mood Pleasant  Thought Process  Coherency WDL  Content WDL  Delusions None reported or observed  Perception WDL  Hallucination None reported or observed  Judgment Impaired  Confusion Mild  Danger to Self  Current suicidal ideation? Denies  Agreement Not to Harm Self Yes  Description of Agreement verbal  Danger to Others  Danger to Others None reported or observed

## 2024-06-04 NOTE — Group Note (Signed)
 Date:  06/04/2024 Time:  11:21 AM  Group Topic/Focus:  Building Self Esteem:   The Focus of this group is helping patients become aware of the effects of self-esteem on their lives, the things they and others do that enhance or undermine their self-esteem, seeing the relationship between their level of self-esteem and the choices they make and learning ways to enhance self-esteem.    Participation Level:  Active  Participation Quality:  Appropriate  Affect:  Appropriate  Cognitive:  Appropriate  Insight: Appropriate  Engagement in Group:  Engaged  Modes of Intervention:  Discussion   Arland Nutting 06/04/2024, 11:21 AM

## 2024-06-04 NOTE — Group Note (Signed)
 Recreation Therapy Group Note   Group Topic:Leisure Education  Group Date: 06/04/2024 Start Time: 1500 End Time: 1550 Facilitators: Celestia Jeoffrey BRAVO, LRT, CTRS Location: Courtyard  Group Description: Music. Patients are encouraged to name their favorite song(s) for LRT to play song through speaker for group to hear, while in the courtyard getting fresh air and sunlight. Patients educated on the definition of leisure and the importance of having different leisure interests outside of the hospital. Group discussed how leisure activities can often be used as Pharmacologist and that listening to music and being outside are examples.    Goal Area(s) Addressed:  Patient will identify a current leisure interest.  Patient will practice making a positive decision. Patient will have the opportunity to try a new leisure activity.  Affect/Mood: Flat   Participation Level: Non-verbal    Clinical Observations/Individualized Feedback: Benjamin [name] was present in the courtyard at the time of group. Pt did not interact with LRT or peers while present.   Plan: Continue to engage patient in RT group sessions 2-3x/week.   Jeoffrey BRAVO Celestia, LRT, CTRS 06/04/2024 4:12 PM

## 2024-06-04 NOTE — Progress Notes (Signed)
 [name] MD Progress Note  06/04/2024 9:57 PM Benjamin [name]  MRN:  [id number] Benjamin [name] is a 78 year old male who presents to the inpatient geriatric psych unit after jumping from a two story building. Patient was originally seen at [facility] who referred him to the ED who admitted him here on 03/27/2024. Patient reports that people from the [employer] gang were trying to kill him and the only escape was through the bedroom window on the second floor. He currently lives in the house with [name] and his family and has been living with them for 3-4 years without any problems. On 03/19/2024 he went to the doctor and when he got back home the family wouldn't let him leave. He states that after he realized the gang was going to kill him he barricaded the door with his bed and jumped out the window. When he landed he ran away until one of his neighbors found him and called 911.   Subjective: Chart is reviewed and discussed with the treatment team.  APS is working on legal guardianship and placement.  Patient is noted to be resting in his bed.  He was getting ready to go into the shower.  Per nursing patient is able to attend to ADLs.  Patient displaying safe behaviors and denies any active SI/HI/plan.  Denies auditory/visual hallucinations.   Capacity :Able to understand medical problem Patient is unable to display any understanding of his medical problems or the current presentation leading up to admission Able to understand proposed treatment  Patient is unable to understand the proposed treatment with antipsychotics Patient remains paranoid and has cognitive impairment as he is unable to discuss his discharge planning.  Given his paranoia and delay in executive function patient is unable to make decisions regarding the treatment and disposition.    Sleep: Fair  Appetite:  Fair  Past Psychiatric History: see h&P  Family History:  Family History  Problem Relation Age of Onset    Colon cancer Neg Hx    Colon polyps Neg Hx    Stomach cancer Neg Hx    Esophageal cancer Neg Hx    Social History:  Social History   Substance and Sexual Activity  Alcohol Use No     Social History   Substance and Sexual Activity  Drug Use No    Social History   Socioeconomic History   Marital status: Single    Spouse name: Not on file   Number of children: Not on file   Years of education: Not on file   Highest education level: Not on file  Occupational History   Not on file  Tobacco Use   Smoking status: Former    Types: Cigarettes   Smokeless tobacco: Never  Vaping Use   Vaping status: Never Used  Substance and Sexual Activity   Alcohol use: No   Drug use: No   Sexual activity: Not on file  Other Topics Concern   Not on file  Social History Narrative   Not on file   Social Drivers of Health   Financial Resource Strain: Not on file  Food Insecurity: No Food Insecurity (03/27/2024)   Hunger Vital Sign    Worried About Running Out of Food in the Last Year: Never true    Ran Out of Food in the Last Year: Never true  Transportation Needs: No Transportation Needs (03/27/2024)   PRAPARE - Administrator, Civil Service (Medical): No    Lack of Transportation (Non-Medical):  No  Physical Activity: Not on file  Stress: Not on file  Social Connections: Moderately Integrated (03/27/2024)   Social Connection and Isolation Panel    Frequency of Communication with Friends and Family: Twice a week    Frequency of Social Gatherings with Friends and Family: Once a week    Attends Religious Services: 1 to 4 times per year    Active Member of [employer] or Organizations: No    Attends Engineer, structural: 1 to 4 times per year    Marital Status: Divorced   Past Medical History:  Past Medical History:  Diagnosis Date   Allergy    Arthritis    back   Prostate cancer (HCC) dx'd 2010   surg only    Past Surgical History:  Procedure Laterality Date    COLONOSCOPY     EYE SURGERY     PROSTATE SURGERY      Current Medications: Current Facility-Administered Medications  Medication Dose Route Frequency Provider Last Rate Last Admin   acetaminophen  (TYLENOL ) tablet 650 mg  650 mg Oral Q6H PRN Coleman, Carolyn H, NP       alum & mag hydroxide-simeth (MAALOX/MYLANTA) 200-200-20 MG/5ML suspension 30 mL  30 mL Oral Q4H PRN Coleman, Carolyn H, NP       docusate sodium  (COLACE) capsule 100 mg  100 mg Oral Daily Natika Geyer, MD   100 mg at 06/04/24 9089   fluPHENAZine  (PROLIXIN ) tablet 10 mg  10 mg Oral BID Shrivastava, Aryendra, MD   10 mg at 06/04/24 2027   Or   fluPHENAZine  (PROLIXIN ) injection 5 mg  5 mg Intramuscular BID Shrivastava, Aryendra, MD       magnesium  hydroxide (MILK OF MAGNESIA) suspension 30 mL  30 mL Oral Daily PRN Mardy Elveria DEL, NP       OLANZapine  (ZYPREXA ) injection 5 mg  5 mg Intramuscular TID PRN Mardy Elveria DEL, NP   5 mg at 04/12/24 2230   OLANZapine  zydis (ZYPREXA ) disintegrating tablet 5 mg  5 mg Oral TID PRN Mardy Elveria DEL, NP       polyethylene glycol (MIRALAX  / GLYCOLAX ) packet 17 g  17 g Oral Daily PRN Donnelly Mellow, MD   17 g at 06/04/24 2123    Lab Results:  No results found for this or any previous visit (from the past 48 hours).    Blood Alcohol level:  Lab Results  Component Value Date   [facility] <15 03/26/2024    Metabolic Disorder Labs: Lab Results  Component Value Date   HGBA1C 5.6 04/02/2024   MPG 114 04/02/2024   No results found for: PROLACTIN Lab Results  Component Value Date   CHOL 146 04/02/2024   TRIG 37 04/02/2024   HDL 43 04/02/2024   CHOLHDL 3.4 04/02/2024   VLDL 7 04/02/2024   LDLCALC 96 04/02/2024    Physical Findings: AIMS:  , ,  ,  ,    CIWA:    COWS:      Psychiatric Specialty Exam:  Presentation  General Appearance:  Appropriate for Environment; Bizarre  Eye Contact: Fair  Speech: Clear and Coherent  Speech Volume: Normal    Mood and  Affect  Mood: Anxious  Affect: Congruent   Thought Process  Thought Processes: Disorganized  Descriptions of Associations:Intact  Orientation:Full (Time, Place and Person)  Thought Content: Paranoia at baseline Hallucinations: Denies  Ideas of Reference: Paranoia at baseline Suicidal Thoughts: Denies  Homicidal Thoughts: Denies   Sensorium  Memory: impiared  Judgment: Impaired  Insight: Shallow   Executive Functions  Concentration: Fair  Attention Span: Fair  Recall: [employer] of Knowledge: Fair  Language: Fair   Psychomotor Activity  Psychomotor Activity: No data recorded  Musculoskeletal: Strength & Muscle Tone: within normal limits Gait & Station: normal Assets  Assets: Manufacturing systems engineer; Resilience    Physical Exam: Physical Exam Vitals and nursing note reviewed.    ROS Blood pressure 121/83, pulse 73, temperature 98.8 F (37.1 C), resp. rate 14, height 6' (1.829 m), weight 100 kg, SpO2 100%. Body mass index is 29.91 kg/m.  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  Patient needs to be monitored closely for ongoing psychosis and paranoid delusions.  Given patient's confusion and chronic paranoia, no family support, APS report has been made and it got screened in for further evaluation by APS.   Differential diagnosis include: unspecified psychosis, delusion disorder, dementia, schizophrenia/schizoaffective disorder   Treatment Plan Summary: Requested the social work team to reach out to APS for safety concerns in the community as patient has chronic paranoia and confusion and is currently homeless patient remains confused and paranoid and spite of being compliant with  medications.  Patient displays cognitive delay and deficits in executive function like planning, organizing and processing information.  At this point patient lacks capacity to make medical decisions.  We recommend APS referral for possible legal guardianship as he has no family or legal next of kin to help with decision-making.   Safety and Monitoring:             -- InVoluntary admission to inpatient psychiatric unit for safety, stabilization and treatment             -- Daily contact with patient to assess and evaluate symptoms and progress in treatment             -- Patient's case to be discussed in multi-disciplinary team meeting             -- Observation Level: q15 minute checks             -- Vital signs:  q12 hours             -- Precautions: suicide, elopement, and assault   2. Psychiatric Diagnoses and Treatment:              Continue Prolixin  to 10 mg twice daily. discontinued Zyprexa .  -- The risks/benefits/side-effects/alternatives to this medication were discussed in detail with the patient and time was given for questions. The patient consents to medication trial.                -- Metabolic profile and EKG monitoring obtained while on an atypical antipsychotic (BMI: Lipid Panel: HbgA1c: QTc:)              -- Encouraged patient to participate in unit milieu and in scheduled group therapies                            3. Medical Issues Being Addressed:    No urgent medical needs noted  4. Discharge Planning:   -- Social work and case management to assist with discharge planning and identification of hospital follow-up  needs prior to discharge  -- Estimated LOS: 3-4 days  Folashade Gamboa, MD 06/04/2024, 9:57 PM

## 2024-06-04 NOTE — Group Note (Signed)
 Recreation Therapy Group Note   Group Topic:Coping Skills  Group Date: 06/04/2024 Start Time: 1100 End Time: 1130 Facilitators: Celestia Jeoffrey BRAVO, LRT, CTRS Location: Dayroom  Group Description: Seated Exercise. LRT discussed the mental and physical benefits of exercise. LRT and group discussed how physical activity can be used as a coping skill. Pt's and LRT followed along to an exercise video on the TV screen that provided a visual representation and audio description of every exercise performed. Pt's encouraged to listen to their bodies and stop at any time if they experience feelings of discomfort or pain. Pts were encouraged to drink water and stay hydrated.   Goal Area(s) Addressed:  Patient will learn benefits of physical activity. Patient will identify exercise as a coping skill.  Patient will follow multistep directions. Patient will try a new leisure interest.    Affect/Mood: N/A   Participation Level: Did not attend    Clinical Observations/Individualized Feedback: Patient did not attend group.   Plan: Continue to engage patient in RT group sessions 2-3x/week.   Jeoffrey BRAVO Celestia, LRT, CTRS 06/04/2024 1:50 PM

## 2024-06-04 NOTE — Group Note (Signed)
 Date:  06/04/2024 Time:  9:09 PM  Group Topic/Focus:  Wrap-Up Group:   The focus of this group is to help patients review their daily goal of treatment and discuss progress on daily workbooks.    Participation Level:  Active  Participation Quality:  Appropriate  Affect:  Appropriate  Cognitive:  Alert  Insight: Good  Engagement in Group:  Engaged  Modes of Intervention:  Discussion  Additional Comments:    Benjamin [name] 06/04/2024, 9:09 PM

## 2024-06-05 DIAGNOSIS — F22 Delusional disorders: Secondary | ICD-10-CM | POA: Diagnosis not present

## 2024-06-05 DIAGNOSIS — F0390 Unspecified dementia without behavioral disturbance: Secondary | ICD-10-CM | POA: Diagnosis not present

## 2024-06-05 NOTE — Group Note (Signed)
 Recreation Therapy Group Note   Group Topic:Relaxation  Group Date: 06/05/2024 Start Time: 1100 End Time: 1130 Facilitators: Celestia Jeoffrey BRAVO, LRT, CTRS Location: Dayroom  Group Description: Chair Yoga. LRT and patients discussed the benefits of yoga and how it differs from strength exercises. LRT educated patients on the mental and physical benefits of yoga and deep breathing and how it can be used as a Associate Professor. LRT instructed patients on different stretching and yoga poses to complete that focused on all parts of the body, as well as deep breathing. Pt encouraged to stop movement at any time if they feel discomfort or pain.   Goal Area(s) Addressed: Patient will practice using relaxation technique. Patient will identify a new coping skill.  Patient will follow multistep directions to reduce anxiety and stress.   Affect/Mood: Asleep   Participation Level: Non-verbal    Clinical Observations/Individualized Feedback: Normon was present in group, however, appeared asleep.   Plan: Continue to engage patient in RT group sessions 2-3x/week.   Jeoffrey BRAVO Celestia, LRT, CTRS 06/05/2024 12:24 PM

## 2024-06-05 NOTE — Progress Notes (Signed)
   06/04/24 2015  Psych Admission Type (Psych Patients Only)  Admission Status Involuntary  Psychosocial Assessment  Patient Complaints None  Eye Contact Brief  Facial Expression Animated  Affect Appropriate to circumstance  Speech Logical/coherent  Interaction Minimal  Motor Activity Slow  Appearance/Hygiene In scrubs  Behavior Characteristics Cooperative  Mood Pleasant  Aggressive Behavior  Effect No apparent injury  Thought Process  Coherency WDL  Content WDL  Delusions None reported or observed  Perception WDL  Hallucination None reported or observed  Judgment Impaired  Confusion Mild  Danger to Self  Current suicidal ideation? Denies  Agreement Not to Harm Self Yes  Description of Agreement Verbal  Danger to Others  Danger to Others None reported or observed

## 2024-06-05 NOTE — Plan of Care (Signed)

## 2024-06-05 NOTE — Progress Notes (Signed)
 [facility] MD Progress Note  06/05/2024 10:54 PM Benjamin [name]  MRN:  [id number] Benjamin [name] is a 78 year old male who presents to the inpatient geriatric psych unit after jumping from a two story building. Patient was originally seen at [facility] who referred him to the ED who admitted him here on 03/27/2024. Patient reports that people from the [place] gang were trying to kill him and the only escape was through the bedroom window on the second floor. He currently lives in the house with [name] and his family and has been living with them for 3-4 years without any problems. On 03/19/2024 he went to the doctor and when he got back home the family wouldn't let him leave. He states that after he realized the gang was going to kill him he barricaded the door with his bed and jumped out the window. When he landed he ran away until one of his neighbors found him and called 911.   Subjective: Chart is reviewed and discussed with the treatment team.  APS is working on legal guardianship and placement.  Patient is noted to be sitting in his room.  He offers no complaints physically regarding his mental health.  He denies SI/HI/plan.  He is not responding to internal stimuli and is not displaying any overt delusions he is noted to be confused at baseline and does not recall the events that led up to the hospital visit and the current situation in the hospital.  Per nursing patient is taking his medications and has no behavioral problems.   Capacity :Able to understand medical problem Patient is unable to display any understanding of his medical problems or the current presentation leading up to admission Able to understand proposed treatment  Patient is unable to understand the proposed treatment with antipsychotics Patient remains paranoid and has cognitive impairment as he is unable to discuss his discharge planning.  Given his paranoia and delay in executive function patient is unable to  make decisions regarding the treatment and disposition.    Sleep: Fair  Appetite:  Fair  Past Psychiatric History: see h&P  Family History:  Family History  Problem Relation Age of Onset   Colon cancer Neg Hx    Colon polyps Neg Hx    Stomach cancer Neg Hx    Esophageal cancer Neg Hx    Social History:  Social History   Substance and Sexual Activity  Alcohol Use No     Social History   Substance and Sexual Activity  Drug Use No    Social History   Socioeconomic History   Marital status: Single    Spouse name: Not on file   Number of children: Not on file   Years of education: Not on file   Highest education level: Not on file  Occupational History   Not on file  Tobacco Use   Smoking status: Former    Types: Cigarettes   Smokeless tobacco: Never  Vaping Use   Vaping status: Never Used  Substance and Sexual Activity   Alcohol use: No   Drug use: No   Sexual activity: Not on file  Other Topics Concern   Not on file  Social History Narrative   Not on file   Social Drivers of Health   Financial Resource Strain: Not on file  Food Insecurity: No Food Insecurity (03/27/2024)   Hunger Vital Sign    Worried About Running Out of Food in the Last Year: Never true    Ran  Out of Food in the Last Year: Never true  Transportation Needs: No Transportation Needs (03/27/2024)   PRAPARE - Administrator, Civil Service (Medical): No    Lack of Transportation (Non-Medical): No  Physical Activity: Not on file  Stress: Not on file  Social Connections: Moderately Integrated (03/27/2024)   Social Connection and Isolation Panel    Frequency of Communication with Friends and Family: Twice a week    Frequency of Social Gatherings with Friends and Family: Once a week    Attends Religious Services: 1 to 4 times per year    Active Member of [employer] or Organizations: No    Attends Engineer, structural: 1 to 4 times per year    Marital Status: Divorced   Past  Medical History:  Past Medical History:  Diagnosis Date   Allergy    Arthritis    back   Prostate cancer (HCC) dx'd 2010   surg only    Past Surgical History:  Procedure Laterality Date   COLONOSCOPY     EYE SURGERY     PROSTATE SURGERY      Current Medications: Current Facility-Administered Medications  Medication Dose Route Frequency Provider Last Rate Last Admin   acetaminophen  (TYLENOL ) tablet 650 mg  650 mg Oral Q6H PRN Coleman, Carolyn H, NP       alum & mag hydroxide-simeth (MAALOX/MYLANTA) 200-200-20 MG/5ML suspension 30 mL  30 mL Oral Q4H PRN Coleman, Carolyn H, NP       docusate sodium  (COLACE) capsule 100 mg  100 mg Oral Daily Vincent Ehrler, MD   100 mg at 06/05/24 0919   fluPHENAZine  (PROLIXIN ) tablet 10 mg  10 mg Oral BID Shrivastava, Aryendra, MD   10 mg at 06/05/24 2055   Or   fluPHENAZine  (PROLIXIN ) injection 5 mg  5 mg Intramuscular BID Shrivastava, Aryendra, MD       magnesium  hydroxide (MILK OF MAGNESIA) suspension 30 mL  30 mL Oral Daily PRN Mardy Elveria DEL, NP       OLANZapine  (ZYPREXA ) injection 5 mg  5 mg Intramuscular TID PRN Mardy Elveria DEL, NP   5 mg at 04/12/24 2230   OLANZapine  zydis (ZYPREXA ) disintegrating tablet 5 mg  5 mg Oral TID PRN Mardy Elveria DEL, NP       polyethylene glycol (MIRALAX  / GLYCOLAX ) packet 17 g  17 g Oral Daily PRN Donnelly Mellow, MD   17 g at 06/04/24 2123    Lab Results:  No results found for this or any previous visit (from the past 48 hours).    Blood Alcohol level:  Lab Results  Component Value Date   [facility] <15 03/26/2024    Metabolic Disorder Labs: Lab Results  Component Value Date   HGBA1C 5.6 04/02/2024   MPG 114 04/02/2024   No results found for: PROLACTIN Lab Results  Component Value Date   CHOL 146 04/02/2024   TRIG 37 04/02/2024   HDL 43 04/02/2024   CHOLHDL 3.4 04/02/2024   VLDL 7 04/02/2024   LDLCALC 96 04/02/2024    Physical Findings: AIMS:  , ,  ,  ,    CIWA:    COWS:       Psychiatric Specialty Exam:  Presentation  General Appearance:  Appropriate for Environment; Bizarre  Eye Contact: Fair  Speech: Clear and Coherent  Speech Volume: Normal    Mood and Affect  Mood: Anxious  Affect: Congruent   Thought Process  Thought Processes: Disorganized  Descriptions of  Associations:Intact  Orientation:Full (Time, Place and Person)  Thought Content: Paranoia at baseline Hallucinations: Denies  Ideas of Reference: Paranoia at baseline Suicidal Thoughts: Denies  Homicidal Thoughts: Denies   Sensorium  Memory: impiared  Judgment: Impaired  Insight: Shallow   Executive Functions  Concentration: Fair  Attention Span: Fair  Recall: [employer] of Knowledge: Fair  Language: Fair   Psychomotor Activity  Psychomotor Activity: No data recorded  Musculoskeletal: Strength & Muscle Tone: within normal limits Gait & Station: normal Assets  Assets: Manufacturing systems engineer; Resilience    Physical Exam: Physical Exam Vitals and nursing note reviewed.    ROS Blood pressure 117/80, pulse 82, temperature 97.9 F (36.6 C), resp. rate 14, height 6' (1.829 m), weight 100 kg, SpO2 100%. Body mass index is 29.91 kg/m.  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  Patient needs to be monitored closely for ongoing psychosis and paranoid delusions.  Given patient's confusion and chronic paranoia, no family support, APS report has been made and it got screened in for further evaluation by APS.   Differential diagnosis include: unspecified psychosis, delusion disorder, dementia, schizophrenia/schizoaffective disorder   Treatment Plan Summary: Requested the social work team  to reach out to APS for safety concerns in the community as patient has chronic paranoia and confusion and is currently homeless patient remains confused and paranoid and spite of being compliant with medications.  Patient displays cognitive delay and deficits in executive function like planning, organizing and processing information.  At this point patient lacks capacity to make medical decisions.  We recommend APS referral for possible legal guardianship as he has no family or legal next of kin to help with decision-making.   Safety and Monitoring:             -- InVoluntary admission to inpatient psychiatric unit for safety, stabilization and treatment             -- Daily contact with patient to assess and evaluate symptoms and progress in treatment             -- Patient's case to be discussed in multi-disciplinary team meeting             -- Observation Level: q15 minute checks             -- Vital signs:  q12 hours             -- Precautions: suicide, elopement, and assault   2. Psychiatric Diagnoses and Treatment:              Continue Prolixin  to 10 mg twice daily. discontinued Zyprexa .  -- The risks/benefits/side-effects/alternatives to this medication were discussed in detail with the patient and time was given for questions. The patient consents to medication trial.                -- Metabolic profile and EKG monitoring obtained while on an atypical antipsychotic (BMI: Lipid Panel: HbgA1c: QTc:)              -- Encouraged patient to participate in unit milieu and in scheduled group therapies  3. Medical Issues Being Addressed:    No urgent medical needs noted  4. Discharge Planning:   -- Social work and case management to assist with discharge planning and identification of hospital follow-up needs prior to discharge  -- Estimated LOS: 3-4 days  Allyn Foil, MD 06/05/2024, 10:54 PM

## 2024-06-05 NOTE — Group Note (Signed)
 [facility] LCSW Group Therapy Note    Group Date: 06/05/2024 Start Time: 1315 End Time: 1345  Type of Therapy and Topic:  Group Therapy:  Overcoming Obstacles  Participation Level:  [facility] PARTICIPATION LEVEL: None  Mood:  Description of Group:   In this group patients will be encouraged to explore what they see as obstacles to their own wellness and recovery. They will be guided to discuss their thoughts, feelings, and behaviors related to these obstacles. The group will process together ways to cope with barriers, with attention given to specific choices patients can make. Each patient will be challenged to identify changes they are motivated to make in order to overcome their obstacles. This group will be process-oriented, with patients participating in exploration of their own experiences as well as giving and receiving support and challenge from other group members.  Therapeutic Goals: 1. Patient will identify personal and current obstacles as they relate to admission. 2. Patient will identify barriers that currently interfere with their wellness or overcoming obstacles.  3. Patient will identify feelings, thought process and behaviors related to these barriers. 4. Patient will identify two changes they are willing to make to overcome these obstacles:    Summary of Patient Progress   Pt attended group, did not participate in group   Therapeutic Modalities:   Cognitive Behavioral Therapy Solution Focused Therapy Motivational Interviewing Relapse Prevention Therapy   Benjamin [name], LCSWA

## 2024-06-05 NOTE — Progress Notes (Signed)
 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 Since then patient has improved, taking his medications well, but remains paranoid.  He attending meals and some groups. Is A&Ox 3, not understanding the situation or able to make his own decisions.  Patient is able to complete his own ADL's with minimal prompting.  Denies SI, HI, AVH, anxiety and depression. Will continue to monitor.

## 2024-06-05 NOTE — Group Note (Signed)
 Date:  06/05/2024 Time:  11:08 PM  Group Topic/Focus:  Wrap-Up Group:   The focus of this group is to help patients review their daily goal of treatment and discuss progress on daily workbooks.    Participation Level:  Did Not Attend  Participation Quality:     Affect:     Cognitive:     Insight: None  Engagement in Group:  None  Modes of Intervention:     Additional Comments:    [name] 06/05/2024, 11:08 PM

## 2024-06-05 NOTE — Progress Notes (Signed)
(  Sleep Hours) -6.75 (Any PRNs that were needed, meds refused, or side effects to meds)- Mira lax I just don't want to keep it going. (Any disturbances and when (visitation, over night)-N/A (Concerns raised by the patient)- N/A (SI/HI/AVH)-Denies

## 2024-06-05 NOTE — Plan of Care (Signed)
  Problem: Activity: Goal: Risk for activity intolerance will decrease 06/05/2024 0350 by Birdena Glee DASEN, RN Outcome: Progressing 06/05/2024 0350 by Birdena Glee DASEN, RN Outcome: Progressing   Problem: Nutrition: Goal: Adequate nutrition will be maintained 06/05/2024 0350 by Birdena Glee DASEN, RN Outcome: Progressing 06/05/2024 0350 by Birdena Glee DASEN, RN Outcome: Progressing   Problem: Health Behavior/Discharge Planning: Goal: Ability to manage health-related needs will improve 06/05/2024 0350 by Birdena Glee DASEN, RN Outcome: Progressing 06/05/2024 0350 by Birdena Glee DASEN, RN Outcome: Progressing   Problem: Education: Goal: Knowledge of General Education information will improve Description: Including pain rating scale, medication(s)/side effects and non-pharmacologic comfort measures 06/05/2024 0350 by Birdena Glee DASEN, RN Outcome: Progressing 06/05/2024 0350 by Birdena Glee DASEN, RN Outcome: Progressing

## 2024-06-05 NOTE — Group Note (Signed)
 Date:  06/05/2024 Time:  11:09 AM  Group Topic/Focus:  Managing Feelings:   The focus of this group is to identify what feelings patients have difficulty handling and develop a plan to handle them in a healthier way upon discharge.    Participation Level:  Active  Participation Quality:  Appropriate  Affect:  Appropriate  Cognitive:  Appropriate  Insight: Appropriate  Engagement in Group:  Engaged  Modes of Intervention:  Discussion  Arland Nutting 06/05/2024, 11:09 AM

## 2024-06-05 NOTE — Group Note (Signed)
 Recreation Therapy Group Note   Group Topic:Health and Wellness  Group Date: 06/05/2024 Start Time: 1345 End Time: 1430 Facilitators: Celestia Jeoffrey BRAVO, LRT, CTRS Location: Courtyard  Group Description: Outdoor Recreation. Patients had the option to play corn hole, ring toss, UNO, or listening to music while outside in the courtyard getting fresh air and sunlight. Patients helped water and prune the raised garden beds. LRT and patients discussed things that they enjoy doing in their free time outside of the hospital. LRT encouraged patients to drink water after being active and getting their heart rate up.   Goal Area(s) Addressed: Patient will identify leisure interests.  Patient will practice healthy decision making. Patient will engage in recreation activity.   Affect/Mood: Appropriate and Flat   Participation Level: Moderate   Participation Quality: Independent   Behavior: Calm and Cooperative   Speech/Thought Process: Loose association   Insight: Fair   Judgement: Fair    Modes of Intervention: Music, Open Conversation, and Socialization   Patient Response to Interventions:  Receptive   Education Outcome:  In group clarification offered    Clinical Observations/Individualized Feedback: Junior was active in their participation of session activities and group discussion. 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    Plan: Continue to engage patient in RT group sessions 2-3x/week.   Jeoffrey BRAVO Celestia, LRT, CTRS 06/05/2024 2:38 PM

## 2024-06-06 DIAGNOSIS — F22 Delusional disorders: Secondary | ICD-10-CM | POA: Diagnosis not present

## 2024-06-06 DIAGNOSIS — F0390 Unspecified dementia without behavioral disturbance: Secondary | ICD-10-CM | POA: Diagnosis not present

## 2024-06-06 NOTE — Group Note (Signed)
 Date:  06/06/2024 Time:  9:27 PM  Group Topic/Focus:  Wrap-Up Group:   The focus of this group is to help patients review their daily goal of treatment and discuss progress on daily workbooks.    Participation Level:  Active  Participation Quality:  Appropriate  Affect:  Appropriate  Cognitive:  Alert  Insight: Appropriate  Engagement in Group:  Engaged  Modes of Intervention:  Discussion  Additional Comments:    Benjamin [name] [name] 06/06/2024, 9:27 PM

## 2024-06-06 NOTE — Progress Notes (Signed)
 [name] MD Progress Note  06/06/2024 11:17 PM Benjamin [name]  MRN:  [id number] Benjamin [name] is a 78 year old male who presents to the inpatient geriatric psych unit after jumping from a two story building. Patient was originally seen at [facility] who referred him to the ED who admitted him here on 03/27/2024. Patient reports that people from the [place] gang were trying to kill him and the only escape was through the bedroom window on the second floor. He currently lives in the house with Benjamin [name] and his family and has been living with them for 3-4 years without any problems. On 03/19/2024 he went to the doctor and when he got back home the family wouldn't let him leave. He states that after he realized the gang was going to kill him he barricaded the door with his bed and jumped out the window. When he landed he ran away until one of his neighbors found him and called 911.   Subjective: Chart is reviewed and discussed with the treatment team.  APS is working on legal guardianship and placement.  Patient is noted to be walking in the hallway.  He reports attending the groups and going outside.  He is not responding to any internal stimuli and is maintaining safe behaviors on the unit.  Per nursing no behavioral outbursts are no need of as needed.   Capacity :Able to understand medical problem Patient is unable to display any understanding of his medical problems or the current presentation leading up to admission Able to understand proposed treatment  Patient is unable to understand the proposed treatment with antipsychotics Patient remains paranoid and has cognitive impairment as he is unable to discuss his discharge planning.  Given his paranoia and delay in executive function patient is unable to make decisions regarding the treatment and disposition.    Sleep: Fair  Appetite:  Fair  Past Psychiatric History: see h&P  Family History:  Family History  Problem Relation  Age of Onset   Colon cancer Neg Hx    Colon polyps Neg Hx    Stomach cancer Neg Hx    Esophageal cancer Neg Hx    Social History:  Social History   Substance and Sexual Activity  Alcohol Use No     Social History   Substance and Sexual Activity  Drug Use No    Social History   Socioeconomic History   Marital status: Single    Spouse name: Not on file   Number of children: Not on file   Years of education: Not on file   Highest education level: Not on file  Occupational History   Not on file  Tobacco Use   Smoking status: Former    Types: Cigarettes   Smokeless tobacco: Never  Vaping Use   Vaping status: Never Used  Substance and Sexual Activity   Alcohol use: No   Drug use: No   Sexual activity: Not on file  Other Topics Concern   Not on file  Social History Narrative   Not on file   Social Drivers of Health   Financial Resource Strain: Not on file  Food Insecurity: No Food Insecurity (03/27/2024)   Hunger Vital Sign    Worried About Running Out of Food in the Last Year: Never true    Ran Out of Food in the Last Year: Never true  Transportation Needs: No Transportation Needs (03/27/2024)   PRAPARE - Administrator, Civil Service (Medical): No  Lack of Transportation (Non-Medical): No  Physical Activity: Not on file  Stress: Not on file  Social Connections: Moderately Integrated (03/27/2024)   Social Connection and Isolation Panel    Frequency of Communication with Friends and Family: Twice a week    Frequency of Social Gatherings with Friends and Family: Once a week    Attends Religious Services: 1 to 4 times per year    Active Member of [employer] or Organizations: No    Attends Engineer, structural: 1 to 4 times per year    Marital Status: Divorced   Past Medical History:  Past Medical History:  Diagnosis Date   Allergy    Arthritis    back   Prostate cancer (HCC) dx'd 2010   surg only    Past Surgical History:  Procedure  Laterality Date   COLONOSCOPY     EYE SURGERY     PROSTATE SURGERY      Current Medications: Current Facility-Administered Medications  Medication Dose Route Frequency Provider Last Rate Last Admin   acetaminophen  (TYLENOL ) tablet 650 mg  650 mg Oral Q6H PRN Coleman, Carolyn H, NP       alum & mag hydroxide-simeth (MAALOX/MYLANTA) 200-200-20 MG/5ML suspension 30 mL  30 mL Oral Q4H PRN Coleman, Carolyn H, NP       docusate sodium  (COLACE) capsule 100 mg  100 mg Oral Daily Angelene Rome, MD   100 mg at 06/06/24 1013   fluPHENAZine  (PROLIXIN ) tablet 10 mg  10 mg Oral BID Shrivastava, Aryendra, MD   10 mg at 06/06/24 2137   Or   fluPHENAZine  (PROLIXIN ) injection 5 mg  5 mg Intramuscular BID Shrivastava, Aryendra, MD       magnesium  hydroxide (MILK OF MAGNESIA) suspension 30 mL  30 mL Oral Daily PRN Mardy Elveria DEL, NP       OLANZapine  (ZYPREXA ) injection 5 mg  5 mg Intramuscular TID PRN Mardy Elveria DEL, NP   5 mg at 04/12/24 2230   OLANZapine  zydis (ZYPREXA ) disintegrating tablet 5 mg  5 mg Oral TID PRN Mardy Elveria DEL, NP       polyethylene glycol (MIRALAX  / GLYCOLAX ) packet 17 g  17 g Oral Daily PRN Donnelly Mellow, MD   17 g at 06/04/24 2123    Lab Results:  No results found for this or any previous visit (from the past 48 hours).    Blood Alcohol level:  Lab Results  Component Value Date   [facility] <15 03/26/2024    Metabolic Disorder Labs: Lab Results  Component Value Date   HGBA1C 5.6 04/02/2024   MPG 114 04/02/2024   No results found for: PROLACTIN Lab Results  Component Value Date   CHOL 146 04/02/2024   TRIG 37 04/02/2024   HDL 43 04/02/2024   CHOLHDL 3.4 04/02/2024   VLDL 7 04/02/2024   LDLCALC 96 04/02/2024    Physical Findings: AIMS:  , ,  ,  ,    CIWA:    COWS:      Psychiatric Specialty Exam:  Presentation  General Appearance:  Appropriate for Environment; Bizarre  Eye Contact: Fair  Speech: Clear and Coherent  Speech  Volume: Normal    Mood and Affect  Mood: Anxious  Affect: Congruent   Thought Process  Thought Processes: Disorganized  Descriptions of Associations:Intact  Orientation:Full (Time, Place and Person)  Thought Content: Paranoia at baseline Hallucinations: Denies  Ideas of Reference: Paranoia at baseline Suicidal Thoughts: Denies  Homicidal Thoughts: Denies   Sensorium  Memory: impiared  Judgment: Impaired  Insight: Shallow   Executive Functions  Concentration: Fair  Attention Span: Fair  Recall: [employer] of Knowledge: Fair  Language: Fair   Psychomotor Activity  Psychomotor Activity: No data recorded  Musculoskeletal: Strength & Muscle Tone: within normal limits Gait & Station: normal Assets  Assets: Manufacturing systems engineer; Resilience    Physical Exam: Physical Exam Vitals and nursing note reviewed.    ROS Blood pressure 125/77, pulse 65, temperature 98.2 F (36.8 C), resp. rate 20, height 6' (1.829 m), weight 100 kg, SpO2 100%. Body mass index is 29.91 kg/m.  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  Patient needs to be monitored closely for ongoing psychosis and paranoid delusions.  Given patient's confusion and chronic paranoia, no family support, APS report has been made and it got screened in for further evaluation by APS.   Differential diagnosis include: unspecified psychosis, delusion disorder, dementia, schizophrenia/schizoaffective disorder   Treatment Plan Summary: Requested the social work team to reach out to APS for safety concerns in the community as patient has chronic paranoia and confusion and is currently homeless patient remains confused and paranoid and  spite of being compliant with medications.  Patient displays cognitive delay and deficits in executive function like planning, organizing and processing information.  At this point patient lacks capacity to make medical decisions.  We recommend APS referral for possible legal guardianship as he has no family or legal next of kin to help with decision-making.   Safety and Monitoring:             -- InVoluntary admission to inpatient psychiatric unit for safety, stabilization and treatment             -- Daily contact with patient to assess and evaluate symptoms and progress in treatment             -- Patient's case to be discussed in multi-disciplinary team meeting             -- Observation Level: q15 minute checks             -- Vital signs:  q12 hours             -- Precautions: suicide, elopement, and assault   2. Psychiatric Diagnoses and Treatment:              Continue Prolixin  to 10 mg twice daily. discontinued Zyprexa .  -- The risks/benefits/side-effects/alternatives to this medication were discussed in detail with the patient and time was given for questions. The patient consents to medication trial.                -- Metabolic profile and EKG monitoring obtained while on an atypical antipsychotic (BMI: Lipid Panel: HbgA1c: QTc:)              -- Encouraged patient to participate in unit milieu and in scheduled group therapies                            3. Medical Issues Being Addressed:    No urgent medical needs noted  4. Discharge Planning:   -- Social work and case management to assist with discharge planning and identification  of hospital follow-up needs prior to discharge  -- Estimated LOS: 3-4 days  Allyn Foil, MD 06/06/2024, 11:17 PM

## 2024-06-06 NOTE — Progress Notes (Signed)
   06/06/24 2100  Psych Admission Type (Psych Patients Only)  Admission Status Voluntary  Psychosocial Assessment  Patient Complaints None  Eye Contact Fair  Facial Expression Animated  Affect Appropriate to circumstance  Speech Logical/coherent  Interaction Isolative  Motor Activity Slow  Appearance/Hygiene In scrubs  Behavior Characteristics Cooperative  Mood Pleasant  Thought Process  Coherency WDL  Content WDL  Delusions None reported or observed  Perception WDL  Hallucination None reported or observed  Judgment Impaired  Confusion Mild  Danger to Self  Current suicidal ideation? Denies  Agreement Not to Harm Self Yes  Description of Agreement verbal  Danger to Others  Danger to Others None reported or observed   Mood/Behavior:  Pleasant and cooperative.  Mild confusion. Isolative.    Psych assessment: Denies SI/HI and AVH.     Interaction / Group attendance:  Isolative. Minimal interaction with peers and staff. Attended group.   Medication/ PRNs: Compliant with scheduled medications. No PRNs given.   Pain: Denies   15 min checks in place for safety.

## 2024-06-06 NOTE — Progress Notes (Signed)
   06/06/24 1300  Psych Admission Type (Psych Patients Only)  Admission Status Involuntary  Psychosocial Assessment  Patient Complaints None  Eye Contact Fair  Facial Expression Animated  Affect Appropriate to circumstance  Speech Logical/coherent  Interaction Isolative  Motor Activity Slow  Appearance/Hygiene In scrubs  Behavior Characteristics Cooperative  Mood Pleasant  Thought Process  Coherency WDL  Content WDL  Delusions None reported or observed  Perception WDL  Hallucination None reported or observed  Judgment Impaired  Confusion Mild  Danger to Self  Current suicidal ideation? Denies  Agreement Not to Harm Self Yes  Description of Agreement verbal  Danger to Others  Danger to Others None reported or observed

## 2024-06-06 NOTE — Plan of Care (Signed)
  Problem: Clinical Measurements: Goal: Will remain free from infection Outcome: Progressing   Problem: Coping: Goal: Level of anxiety will decrease Outcome: Progressing   Problem: Safety: Goal: Ability to remain free from injury will improve Outcome: Progressing   Problem: Education: Goal: Mental status will improve Outcome: Progressing   Problem: Activity: Goal: Sleeping patterns will improve Outcome: Progressing   Problem: Coping: Goal: Ability to demonstrate self-control will improve Outcome: Progressing

## 2024-06-06 NOTE — Progress Notes (Signed)
   06/06/24 0200  Psych Admission Type (Psych Patients Only)  Admission Status Involuntary  Psychosocial Assessment  Patient Complaints None  Eye Contact Fair  Facial Expression Animated  Affect Appropriate to circumstance  Speech Logical/coherent  Interaction Minimal  Motor Activity Slow  Appearance/Hygiene In scrubs  Behavior Characteristics Cooperative  Mood Pleasant  Thought Process  Coherency WDL  Content WDL  Delusions None reported or observed  Perception WDL  Hallucination None reported or observed  Judgment Impaired  Confusion Mild  Danger to Self  Current suicidal ideation? Denies  Agreement Not to Harm Self Yes  Description of Agreement Verbalized  Danger to Others  Danger to Others None reported or observed

## 2024-06-06 NOTE — Plan of Care (Signed)
  Problem: Education: Goal: Knowledge of General Education information will improve Description: Including pain rating scale, medication(s)/side effects and non-pharmacologic comfort measures Outcome: Progressing   Problem: Health Behavior/Discharge Planning: Goal: Ability to manage health-related needs will improve Outcome: Progressing   Problem: Clinical Measurements: Goal: Ability to maintain clinical measurements within normal limits will improve Outcome: Progressing Goal: Will remain free from infection Outcome: Progressing Goal: Diagnostic test results will improve Outcome: Progressing Goal: Respiratory complications will improve Outcome: Progressing Goal: Cardiovascular complication will be avoided Outcome: Progressing   Problem: Activity: Goal: Risk for activity intolerance will decrease Outcome: Progressing   Problem: Nutrition: Goal: Adequate nutrition will be maintained Outcome: Progressing   Problem: Coping: Goal: Level of anxiety will decrease Outcome: Progressing   Problem: Elimination: Goal: Will not experience complications related to bowel motility Outcome: Progressing Goal: Will not experience complications related to urinary retention Outcome: Progressing   Problem: Pain Managment: Goal: General experience of comfort will improve and/or be controlled Outcome: Progressing   Problem: Safety: Goal: Ability to remain free from injury will improve Outcome: Progressing   Problem: Skin Integrity: Goal: Risk for impaired skin integrity will decrease Outcome: Progressing   Problem: Education: Goal: Knowledge of [place] [facility] Education information/materials will improve Outcome: Progressing Goal: Emotional status will improve Outcome: Progressing Goal: Mental status will improve Outcome: Progressing Goal: Verbalization of understanding the information provided will improve Outcome: Progressing   Problem: Activity: Goal: Interest or  engagement in activities will improve Outcome: Progressing Goal: Sleeping patterns will improve Outcome: Progressing   Problem: Coping: Goal: Ability to verbalize frustrations and anger appropriately will improve Outcome: Progressing Goal: Ability to demonstrate self-control will improve Outcome: Progressing   Problem: Health Behavior/Discharge Planning: Goal: Identification of resources available to assist in meeting health care needs will improve Outcome: Progressing Goal: Compliance with treatment plan for underlying cause of condition will improve Outcome: Progressing   Problem: Physical Regulation: Goal: Ability to maintain clinical measurements within normal limits will improve Outcome: Progressing   Problem: Safety: Goal: Periods of time without injury will increase Outcome: Progressing

## 2024-06-06 NOTE — Group Note (Signed)
 Date:  06/06/2024 Time:  10:55 AM  Group Topic/Focus:  Self Care:   The focus of this group is to help patients understand the importance of self-care in order to improve or restore emotional, physical, spiritual, interpersonal, and financial health. We also discussed health eating habits and hygiene.     Participation Level:  Did Not Attend   Leigh VEAR Pais 06/06/2024, 10:55 AM

## 2024-06-06 NOTE — Plan of Care (Signed)
   Problem: Education: Goal: Knowledge of General Education information will improve Description Including pain rating scale, medication(s)/side effects and non-pharmacologic comfort measures Outcome: Progressing   Problem: Health Behavior/Discharge Planning: Goal: Ability to manage health-related needs will improve Outcome: Progressing

## 2024-06-06 NOTE — Progress Notes (Incomplete)
 Mood/Behavior:  Pleasant and cooperative.  Mild confusion. Isolative.    Psych assessment: Denies SI/HI and AVH.     Interaction / Group attendance:  Isolative. Minimal interaction with peers and staff.    Medication/ PRNs: Compliant with scheduled medications. No PRNs given.   Pain: Denies   15 min checks in place for safety.

## 2024-06-07 DIAGNOSIS — F0390 Unspecified dementia without behavioral disturbance: Secondary | ICD-10-CM | POA: Diagnosis not present

## 2024-06-07 DIAGNOSIS — F22 Delusional disorders: Secondary | ICD-10-CM | POA: Diagnosis not present

## 2024-06-07 NOTE — Plan of Care (Signed)
  Problem: Education: Goal: Knowledge of General Education information will improve Description: Including pain rating scale, medication(s)/side effects and non-pharmacologic comfort measures Outcome: Progressing   Problem: Health Behavior/Discharge Planning: Goal: Ability to manage health-related needs will improve Outcome: Progressing   Problem: Clinical Measurements: Goal: Ability to maintain clinical measurements within normal limits will improve Outcome: Progressing Goal: Will remain free from infection Outcome: Progressing Goal: Diagnostic test results will improve Outcome: Progressing Goal: Respiratory complications will improve Outcome: Progressing Goal: Cardiovascular complication will be avoided Outcome: Progressing   Problem: Activity: Goal: Risk for activity intolerance will decrease Outcome: Progressing   Problem: Nutrition: Goal: Adequate nutrition will be maintained Outcome: Progressing   Problem: Coping: Goal: Level of anxiety will decrease Outcome: Progressing   Problem: Elimination: Goal: Will not experience complications related to bowel motility Outcome: Progressing Goal: Will not experience complications related to urinary retention Outcome: Progressing   Problem: Pain Managment: Goal: General experience of comfort will improve and/or be controlled Outcome: Progressing   Problem: Safety: Goal: Ability to remain free from injury will improve Outcome: Progressing   Problem: Skin Integrity: Goal: Risk for impaired skin integrity will decrease Outcome: Progressing   Problem: Education: Goal: Knowledge of [place] [facility] Education information/materials will improve Outcome: Progressing Goal: Emotional status will improve Outcome: Progressing Goal: Mental status will improve Outcome: Progressing Goal: Verbalization of understanding the information provided will improve Outcome: Progressing   Problem: Activity: Goal: Interest or  engagement in activities will improve Outcome: Progressing Goal: Sleeping patterns will improve Outcome: Progressing   Problem: Coping: Goal: Ability to verbalize frustrations and anger appropriately will improve Outcome: Progressing Goal: Ability to demonstrate self-control will improve Outcome: Progressing   Problem: Health Behavior/Discharge Planning: Goal: Identification of resources available to assist in meeting health care needs will improve Outcome: Progressing Goal: Compliance with treatment plan for underlying cause of condition will improve Outcome: Progressing   Problem: Physical Regulation: Goal: Ability to maintain clinical measurements within normal limits will improve Outcome: Progressing   Problem: Safety: Goal: Periods of time without injury will increase Outcome: Progressing

## 2024-06-07 NOTE — Group Note (Signed)
 Date:  06/07/2024 Time:  10:55 AM  Group Topic/Focus:  Exercise Group:  The focus of the group is to encourage patients to watch a video which demonstrated different exercises to perform while in a seated position.  Patients were given an explanation on how each movements could benefit their physical and mental health in the long run.    Participation Level:  Did Not Attend   Benjamin [name] Gomer France 06/07/2024, 10:55 AM

## 2024-06-07 NOTE — Progress Notes (Signed)
   06/07/24 2100  Psych Admission Type (Psych Patients Only)  Admission Status Voluntary  Psychosocial Assessment  Patient Complaints None  Eye Contact Fair  Facial Expression Animated  Affect Appropriate to circumstance  Speech Logical/coherent  Interaction Minimal  Motor Activity Slow  Appearance/Hygiene In scrubs  Behavior Characteristics Appropriate to situation  Mood Pleasant  Thought Process  Coherency WDL  Content WDL  Delusions None reported or observed  Perception WDL  Hallucination None reported or observed  Judgment Impaired  Confusion Mild  Danger to Self  Current suicidal ideation? Denies  Self-Injurious Behavior No self-injurious ideation or behavior indicators observed or expressed   Danger to Others  Danger to Others None reported or observed   Mood/Behavior:  Pleasant and cooperative.  Mild confusion.    Psych assessment: Denies SI/HI and AVH.     Interaction / Group attendance:  Present in the milieu. Minimal interaction with peers and staff.  Attended group.   Medication/ PRNs: Compliant with scheduled medications. No PRNs given.   Pain: Denies   15 min checks in place for safety.

## 2024-06-07 NOTE — Group Note (Signed)
 [facility] LCSW Group Therapy Note   Group Date: 06/07/2024 Start Time: 1315 End Time: 1400   Type of Therapy/Topic:  Group Therapy:  Emotion Regulation  Participation Level:  Did Not Attend   Mood:  Description of Group:    The purpose of this group is to assist patients in learning to regulate negative emotions and experience positive emotions. Patients will be guided to discuss ways in which they have been vulnerable to their negative emotions. These vulnerabilities will be juxtaposed with experiences of positive emotions or situations, and patients challenged to use positive emotions to combat negative ones. Special emphasis will be placed on coping with negative emotions in conflict situations, and patients will process healthy conflict resolution skills.  Therapeutic Goals: Patient will identify two positive emotions or experiences to reflect on in order to balance out negative emotions:  Patient will label two or more emotions that they find the most difficult to experience:  Patient will be able to demonstrate positive conflict resolution skills through discussion or role plays:   Summary of Patient Progress:   X    Therapeutic Modalities:   Cognitive Behavioral Therapy Feelings Identification Dialectical Behavioral Therapy   [name], LCSWA

## 2024-06-07 NOTE — Progress Notes (Signed)
   06/07/24 1500  Psych Admission Type (Psych Patients Only)  Admission Status Voluntary  Psychosocial Assessment  Patient Complaints None  Eye Contact Fair  Facial Expression Animated  Affect Appropriate to circumstance  Speech Logical/coherent  Interaction Minimal  Motor Activity Slow  Appearance/Hygiene In scrubs  Behavior Characteristics Cooperative  Mood Pleasant  Thought Process  Coherency WDL  Content WDL  Delusions None reported or observed  Perception WDL  Hallucination None reported or observed  Judgment Impaired  Confusion Mild  Danger to Self  Current suicidal ideation? Denies  Agreement Not to Harm Self Yes  Description of Agreement verbal  Danger to Others  Danger to Others None reported or observed

## 2024-06-07 NOTE — Group Note (Signed)
 Recreation Therapy Group Note   Group Topic:Communication  Group Date: 06/07/2024 Start Time: 1100 End Time: 1130 Facilitators: Celestia Jeoffrey BRAVO, LRT, CTRS Location: Dayroom  Group Description: Emotional Check in. Patient sat and talked with LRT about how they are doing and whatever else is on their mind. LRT provided active listening, reassurance and encouragement. Pts were given the opportunity to listen to music or color mandalas while they talk.    Goal Area(s) Addressed: Patient will engage in conversation with LRT. Patient will communicate their wants, needs, or questions.  Patient will practice a new coping skill of "talking to someone".   Affect/Mood: Appropriate   Participation Level: Active and Engaged   Participation Quality: Independent   Behavior: Calm and Cooperative   Speech/Thought Process: Coherent   Insight: Fair   Judgement: Fair    Modes of Intervention: Open Conversation, Rapport Building, and Support   Patient Response to Interventions:  Attentive, Engaged, and Receptive   Education Outcome:  Acknowledges education   Clinical Observations/Individualized Feedback: Matthewjames was active in their participation of session activities and group discussion. Pt identified that he was looking forward to going outside and listening to music today.    Plan: Continue to engage patient in RT group sessions 2-3x/week.   Jeoffrey BRAVO Celestia, LRT, CTRS 06/07/2024 1:23 PM

## 2024-06-07 NOTE — Group Note (Signed)
 Recreation Therapy Group Note   Group Topic:Emotion Expression  Group Date: 06/07/2024 Start Time: 1400 End Time: 1450 Facilitators: Celestia Jeoffrey BRAVO, LRT, CTRS Location: Courtyard  Group Description: Music Reminisce. LRT encouraged patients to think of their favorite song(s) that reminded them of a positive memory or time in their life. LRT encouraged patient to talk about that memory aloud to the group. LRT played the song through a speaker for all to hear. LRT and patients discussed how thinking of a positive memory or time in their life can be used as a coping skill in everyday life post discharge.   Goal Area(s) Addressed: Patient will increase verbal communication by conversing with peers. Patient will contribute to group discussion with minimal prompting. Patient will reminisce a positive memory or moment in their life.    Affect/Mood: N/A   Participation Level: Did not attend    Clinical Observations/Individualized Feedback: Patient did not attend group.   Plan: Continue to engage patient in RT group sessions 2-3x/week.   Jeoffrey BRAVO Celestia, LRT, CTRS 06/07/2024 4:45 PM

## 2024-06-07 NOTE — Group Note (Signed)
 Date:  06/07/2024 Time:  8:29 PM  Group Topic/Focus:  Wrap-Up Group:   The focus of this group is to help patients review their daily goal of treatment and discuss progress on daily workbooks.    Participation Level:  Active  Participation Quality:  Appropriate  Affect:  Appropriate  Cognitive:  Appropriate  Insight: Appropriate  Engagement in Group:  Engaged  Modes of Intervention:  Discussion  Additional Comments:    Benjamin [name] 06/07/2024, 8:29 PM

## 2024-06-07 NOTE — Progress Notes (Signed)
 [facility] MD Progress Note  06/07/2024 1:26 PM Benjamin [name]  MRN:  [id number] Benjamin [name] is a 78 year old male who presents to the inpatient geriatric psych unit after jumping from a two story building. Patient was originally seen at [facility] who referred him to the ED who admitted him here on 03/27/2024. Patient reports that people from the [place] gang were trying to kill him and the only escape was through the bedroom window on the second floor. He currently lives in the house with [name] and his family and has been living with them for 3-4 years without any problems. On 03/19/2024 he went to the doctor and when he got back home the family wouldn't let him leave. He states that after he realized the gang was going to kill him he barricaded the door with his bed and jumped out the window. When he landed he ran away until one of his neighbors found him and called 911.   Subjective: Chart is reviewed and discussed with the treatment team.  APS is working on legal guardianship and placement.  He has guardianship hearing June 19, 2024 Patient is noted to be resting in bed.  He offers no complaints.  He came out of his room to have his meals.  Per nursing patient is taking his medications with no reported side effects.  On guidance and prompting is able to take care of his ADLs.  No other physical complaints.  He is not responding to internal stimuli and is not endorsing SI/HI/plan.     Capacity :Able to understand medical problem Patient is unable to display any understanding of his medical problems or the current presentation leading up to admission Able to understand proposed treatment  Patient is unable to understand the proposed treatment with antipsychotics Patient remains paranoid and has cognitive impairment as he is unable to discuss his discharge planning.  Given his paranoia and delay in executive function patient is unable to make decisions regarding the treatment  and disposition.    Sleep: Fair  Appetite:  Fair  Past Psychiatric History: see h&P  Family History:  Family History  Problem Relation Age of Onset   Colon cancer Neg Hx    Colon polyps Neg Hx    Stomach cancer Neg Hx    Esophageal cancer Neg Hx    Social History:  Social History   Substance and Sexual Activity  Alcohol Use No     Social History   Substance and Sexual Activity  Drug Use No    Social History   Socioeconomic History   Marital status: Single    Spouse name: Not on file   Number of children: Not on file   Years of education: Not on file   Highest education level: Not on file  Occupational History   Not on file  Tobacco Use   Smoking status: Former    Types: Cigarettes   Smokeless tobacco: Never  Vaping Use   Vaping status: Never Used  Substance and Sexual Activity   Alcohol use: No   Drug use: No   Sexual activity: Not on file  Other Topics Concern   Not on file  Social History Narrative   Not on file   Social Drivers of Health   Financial Resource Strain: Not on file  Food Insecurity: No Food Insecurity (03/27/2024)   Hunger Vital Sign    Worried About Running Out of Food in the Last Year: Never true    Ran Out of  Food in the Last Year: Never true  Transportation Needs: No Transportation Needs (03/27/2024)   PRAPARE - Administrator, Civil Service (Medical): No    Lack of Transportation (Non-Medical): No  Physical Activity: Not on file  Stress: Not on file  Social Connections: Moderately Integrated (03/27/2024)   Social Connection and Isolation Panel    Frequency of Communication with Friends and Family: Twice a week    Frequency of Social Gatherings with Friends and Family: Once a week    Attends Religious Services: 1 to 4 times per year    Active Member of [employer] or Organizations: No    Attends Engineer, structural: 1 to 4 times per year    Marital Status: Divorced   Past Medical History:  Past Medical History:   Diagnosis Date   Allergy    Arthritis    back   Prostate cancer (HCC) dx'd 2010   surg only    Past Surgical History:  Procedure Laterality Date   COLONOSCOPY     EYE SURGERY     PROSTATE SURGERY      Current Medications: Current Facility-Administered Medications  Medication Dose Route Frequency Provider Last Rate Last Admin   acetaminophen  (TYLENOL ) tablet 650 mg  650 mg Oral Q6H PRN Coleman, Carolyn H, NP       alum & mag hydroxide-simeth (MAALOX/MYLANTA) 200-200-20 MG/5ML suspension 30 mL  30 mL Oral Q4H PRN Coleman, Carolyn H, NP       docusate sodium  (COLACE) capsule 100 mg  100 mg Oral Daily Beverlee Wilmarth, MD   100 mg at 06/07/24 9062   fluPHENAZine  (PROLIXIN ) tablet 10 mg  10 mg Oral BID Shrivastava, Aryendra, MD   10 mg at 06/07/24 9197   Or   fluPHENAZine  (PROLIXIN ) injection 5 mg  5 mg Intramuscular BID Shrivastava, Aryendra, MD       magnesium  hydroxide (MILK OF MAGNESIA) suspension 30 mL  30 mL Oral Daily PRN Mardy Elveria DEL, NP       OLANZapine  (ZYPREXA ) injection 5 mg  5 mg Intramuscular TID PRN Mardy Elveria DEL, NP   5 mg at 04/12/24 2230   OLANZapine  zydis (ZYPREXA ) disintegrating tablet 5 mg  5 mg Oral TID PRN Mardy Elveria DEL, NP       polyethylene glycol (MIRALAX  / GLYCOLAX ) packet 17 g  17 g Oral Daily PRN Donnelly Mellow, MD   17 g at 06/04/24 2123    Lab Results:  No results found for this or any previous visit (from the past 48 hours).    Blood Alcohol level:  Lab Results  Component Value Date   [name] <15 03/26/2024    Metabolic Disorder Labs: Lab Results  Component Value Date   HGBA1C 5.6 04/02/2024   MPG 114 04/02/2024   No results found for: PROLACTIN Lab Results  Component Value Date   CHOL 146 04/02/2024   TRIG 37 04/02/2024   HDL 43 04/02/2024   CHOLHDL 3.4 04/02/2024   VLDL 7 04/02/2024   LDLCALC 96 04/02/2024    Physical Findings: AIMS:  , ,  ,  ,    CIWA:    COWS:      Psychiatric Specialty Exam:  Presentation   General Appearance:  Appropriate for Environment; Bizarre  Eye Contact: Fair  Speech: Clear and Coherent  Speech Volume: Normal    Mood and Affect  Mood: Anxious  Affect: Congruent   Thought Process  Thought Processes: Disorganized  Descriptions of Associations:Intact  Orientation:Full (Time, Place and Person)  Thought Content: Paranoia at baseline Hallucinations: Denies  Ideas of Reference: Paranoia at baseline Suicidal Thoughts: Denies  Homicidal Thoughts: Denies   Sensorium  Memory: impiared  Judgment: Impaired  Insight: Shallow   Executive Functions  Concentration: Fair  Attention Span: Fair  Recall: [employer] of Knowledge: Fair  Language: Fair   Psychomotor Activity  Psychomotor Activity: No data recorded  Musculoskeletal: Strength & Muscle Tone: within normal limits Gait & Station: normal Assets  Assets: Manufacturing systems engineer; Resilience    Physical Exam: Physical Exam Vitals and nursing note reviewed.    ROS Blood pressure 121/73, pulse 72, temperature 98.2 F (36.8 C), resp. rate 20, height 6' (1.829 m), weight 100 kg, SpO2 99%. Body mass index is 29.91 kg/m.  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  Patient needs to be monitored closely for ongoing psychosis and paranoid delusions.  Given patient's confusion and chronic paranoia, no family support, APS report has been made and it got screened in for further evaluation by APS.   Differential diagnosis include: unspecified psychosis, delusion disorder, dementia, schizophrenia/schizoaffective disorder   Treatment Plan Summary: Requested the social work team to reach out to APS for safety concerns in the  community as patient has chronic paranoia and confusion and is currently homeless patient remains confused and paranoid and spite of being compliant with medications.  Patient displays cognitive delay and deficits in executive function like planning, organizing and processing information.  At this point patient lacks capacity to make medical decisions.  We recommend APS referral for possible legal guardianship as he has no family or legal next of kin to help with decision-making.   Safety and Monitoring:             -- InVoluntary admission to inpatient psychiatric unit for safety, stabilization and treatment             -- Daily contact with patient to assess and evaluate symptoms and progress in treatment             -- Patient's case to be discussed in multi-disciplinary team meeting             -- Observation Level: q15 minute checks             -- Vital signs:  q12 hours             -- Precautions: suicide, elopement, and assault   2. Psychiatric Diagnoses and Treatment:              Continue Prolixin  to 10 mg twice daily. discontinued Zyprexa .  -- The risks/benefits/side-effects/alternatives to this medication were discussed in detail with the patient and time was given for questions. The patient consents to medication trial.                -- Metabolic profile and EKG monitoring obtained while on an atypical antipsychotic (BMI: Lipid Panel: HbgA1c: QTc:)              -- Encouraged patient to participate in unit milieu and in scheduled group therapies                            3.  Medical Issues Being Addressed:    No urgent medical needs noted  4. Discharge Planning:   -- Social work and case management to assist with discharge planning and identification of hospital follow-up needs prior to discharge  -- Estimated LOS: 3-4 days  Allyn Foil, MD 06/07/2024, 1:26 PM

## 2024-06-08 DIAGNOSIS — F22 Delusional disorders: Secondary | ICD-10-CM | POA: Diagnosis not present

## 2024-06-08 DIAGNOSIS — F0390 Unspecified dementia without behavioral disturbance: Secondary | ICD-10-CM | POA: Diagnosis not present

## 2024-06-08 NOTE — Progress Notes (Signed)
 [facility] MD Progress Note  06/08/2024 3:45 PM Tunis Gentle  MRN:  [id number] Benjamin [name] is a 78 year old male who presents to the inpatient geriatric psych unit after jumping from a two story building. Patient was originally seen at [facility] who referred him to the ED who admitted him here on 03/27/2024. Patient reports that people from the [place] gang were trying to kill him and the only escape was through the bedroom window on the second floor. He currently lives in the house with [name] and his family and has been living with them for 3-4 years without any problems. On 03/19/2024 he went to the doctor and when he got back home the family wouldn't let him leave. He states that after he realized the gang was going to kill him he barricaded the door with his bed and jumped out the window. When he landed he ran away until one of his neighbors found him and called 911.   Subjective: Chart is reviewed and discussed with the treatment team.  APS is working on legal guardianship and placement.  He has guardianship hearing June 19, 2024  Patient is noted to be participating in the groups.  He offers no complaints.  Per nursing he has fair appetite and sleep.  Patient denies SI/HI/plan.  Patient denies auditory/visual hallucinations.     Capacity :Able to understand medical problem Patient is unable to display any understanding of his medical problems or the current presentation leading up to admission Able to understand proposed treatment  Patient is unable to understand the proposed treatment with antipsychotics Patient remains paranoid and has cognitive impairment as he is unable to discuss his discharge planning.  Given his paranoia and delay in executive function patient is unable to make decisions regarding the treatment and disposition.    Sleep: Fair  Appetite:  Fair  Past Psychiatric History: see h&P  Family History:  Family History  Problem Relation Age of  Onset   Colon cancer Neg Hx    Colon polyps Neg Hx    Stomach cancer Neg Hx    Esophageal cancer Neg Hx    Social History:  Social History   Substance and Sexual Activity  Alcohol Use No     Social History   Substance and Sexual Activity  Drug Use No    Social History   Socioeconomic History   Marital status: Single    Spouse name: Not on file   Number of children: Not on file   Years of education: Not on file   Highest education level: Not on file  Occupational History   Not on file  Tobacco Use   Smoking status: Former    Types: Cigarettes   Smokeless tobacco: Never  Vaping Use   Vaping status: Never Used  Substance and Sexual Activity   Alcohol use: No   Drug use: No   Sexual activity: Not on file  Other Topics Concern   Not on file  Social History Narrative   Not on file   Social Drivers of Health   Financial Resource Strain: Not on file  Food Insecurity: No Food Insecurity (03/27/2024)   Hunger Vital Sign    Worried About Running Out of Food in the Last Year: Never true    Ran Out of Food in the Last Year: Never true  Transportation Needs: No Transportation Needs (03/27/2024)   PRAPARE - Administrator, Civil Service (Medical): No    Lack of Transportation (Non-Medical): No  Physical Activity: Not on file  Stress: Not on file  Social Connections: Moderately Integrated (03/27/2024)   Social Connection and Isolation Panel    Frequency of Communication with Friends and Family: Twice a week    Frequency of Social Gatherings with Friends and Family: Once a week    Attends Religious Services: 1 to 4 times per year    Active Member of [employer] or Organizations: No    Attends Engineer, structural: 1 to 4 times per year    Marital Status: Divorced   Past Medical History:  Past Medical History:  Diagnosis Date   Allergy    Arthritis    back   Prostate cancer (HCC) dx'd 2010   surg only    Past Surgical History:  Procedure Laterality Date    COLONOSCOPY     EYE SURGERY     PROSTATE SURGERY      Current Medications: Current Facility-Administered Medications  Medication Dose Route Frequency Provider Last Rate Last Admin   acetaminophen  (TYLENOL ) tablet 650 mg  650 mg Oral Q6H PRN Coleman, Carolyn H, NP       alum & mag hydroxide-simeth (MAALOX/MYLANTA) 200-200-20 MG/5ML suspension 30 mL  30 mL Oral Q4H PRN Coleman, Carolyn H, NP       docusate sodium  (COLACE) capsule 100 mg  100 mg Oral Daily Diahn Waidelich, MD   100 mg at 06/08/24 9074   fluPHENAZine  (PROLIXIN ) tablet 10 mg  10 mg Oral BID Shrivastava, Aryendra, MD   10 mg at 06/08/24 9074   Or   fluPHENAZine  (PROLIXIN ) injection 5 mg  5 mg Intramuscular BID Shrivastava, Aryendra, MD       magnesium  hydroxide (MILK OF MAGNESIA) suspension 30 mL  30 mL Oral Daily PRN Mardy Elveria DEL, NP       OLANZapine  (ZYPREXA ) injection 5 mg  5 mg Intramuscular TID PRN Mardy Elveria DEL, NP   5 mg at 04/12/24 2230   OLANZapine  zydis (ZYPREXA ) disintegrating tablet 5 mg  5 mg Oral TID PRN Mardy Elveria DEL, NP       polyethylene glycol (MIRALAX  / GLYCOLAX ) packet 17 g  17 g Oral Daily PRN Donnelly Mellow, MD   17 g at 06/04/24 2123    Lab Results:  No results found for this or any previous visit (from the past 48 hours).    Blood Alcohol level:  Lab Results  Component Value Date   [facility] <15 03/26/2024    Metabolic Disorder Labs: Lab Results  Component Value Date   HGBA1C 5.6 04/02/2024   MPG 114 04/02/2024   No results found for: PROLACTIN Lab Results  Component Value Date   CHOL 146 04/02/2024   TRIG 37 04/02/2024   HDL 43 04/02/2024   CHOLHDL 3.4 04/02/2024   VLDL 7 04/02/2024   LDLCALC 96 04/02/2024    Physical Findings: AIMS:  , ,  ,  ,    CIWA:    COWS:      Psychiatric Specialty Exam:  Presentation  General Appearance:  Appropriate for Environment; Bizarre  Eye Contact: Fair  Speech: Clear and Coherent  Speech Volume: Normal    Mood  and Affect  Mood: Anxious  Affect: Congruent   Thought Process  Thought Processes: Disorganized  Descriptions of Associations:Intact  Orientation:Full (Time, Place and Person)  Thought Content: Paranoia at baseline Hallucinations: Denies  Ideas of Reference: Paranoia at baseline Suicidal Thoughts: Denies  Homicidal Thoughts: Denies   Sensorium  Memory: impiared  Judgment: Impaired  Insight: Shallow   Executive Functions  Concentration: Fair  Attention Span: Fair  Recall: [employer] of Knowledge: Fair  Language: Fair   Psychomotor Activity  Psychomotor Activity: No data recorded  Musculoskeletal: Strength & Muscle Tone: within normal limits Gait & Station: normal Assets  Assets: Manufacturing systems engineer; Resilience    Physical Exam: Physical Exam Vitals and nursing note reviewed.    ROS Blood pressure 114/78, pulse 75, temperature 98.1 F (36.7 C), resp. rate 16, height 6' (1.829 m), weight 100 kg, SpO2 98%. Body mass index is 29.91 kg/m.  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  Patient needs to be monitored closely for ongoing psychosis and paranoid delusions.  Given patient's confusion and chronic paranoia, no family support, APS report has been made and it got screened in for further evaluation by APS.   Differential diagnosis include: unspecified psychosis, delusion disorder, dementia, schizophrenia/schizoaffective disorder   Treatment Plan Summary: Requested the social work team to reach out to APS for safety concerns in the community as patient has chronic paranoia and confusion and is currently homeless patient remains confused and paranoid and spite of being compliant with  medications.  Patient displays cognitive delay and deficits in executive function like planning, organizing and processing information.  At this point patient lacks capacity to make medical decisions.  We recommend APS referral for possible legal guardianship as he has no family or legal next of kin to help with decision-making.   Safety and Monitoring:             -- InVoluntary admission to inpatient psychiatric unit for safety, stabilization and treatment             -- Daily contact with patient to assess and evaluate symptoms and progress in treatment             -- Patient's case to be discussed in multi-disciplinary team meeting             -- Observation Level: q15 minute checks             -- Vital signs:  q12 hours             -- Precautions: suicide, elopement, and assault   2. Psychiatric Diagnoses and Treatment:              Continue Prolixin  to 10 mg twice daily. discontinued Zyprexa .  -- The risks/benefits/side-effects/alternatives to this medication were discussed in detail with the patient and time was given for questions. The patient consents to medication trial.                -- Metabolic profile and EKG monitoring obtained while on an atypical antipsychotic (BMI: Lipid Panel: HbgA1c: QTc:)              -- Encouraged patient to participate in unit milieu and in scheduled group therapies                            3. Medical Issues Being Addressed:    No urgent medical needs noted  4. Discharge Planning:   -- Social work and case management to assist with discharge planning and identification of hospital follow-up needs prior to  discharge  -- Estimated LOS: 3-4 days  Shetara Launer, MD 06/08/2024, 3:45 PM

## 2024-06-08 NOTE — Group Note (Signed)
 Physical/Occupational Therapy Group Note  Group Topic: UE Therex   Group Date: 06/08/2024 Start Time: 1305 End Time: 1350 Facilitators: Clive Warren CROME, OT     Group Description: Group instructed in series of upper extremities exercises, aimed to promote strength, flexibility, range of motion and functional endurance.  Patients provided cuing for proper mechanics and proper pace of exercise; exercises adjusted as necessary for individualized patient needs.  Patient also engaged in cognitive components throughout session, working to integrate attention to task, command following, turn-taking and appropriate social interaction throughout session.  Allowed to ask questions as appropriate, and encouraged to identify specific exercises that they could complete independently outside of group sessions.   Therapeutic Goal(s): Demonstrate appropriate performance of upper extremity exercises to promote strength, flexibility, range of motion and functional endurance Identify 2-3 specific upper extremity exercises to complete as home exercise program outside of group session   Individual Participation: Pt engaged throughout, requiring Min-Mod multimodal cues for technique with most exercises despite attempts to figure it out on his own with use of the handout as guide. Pt engaged with group with prompting. Appeared to be a bit more alert and active in engagement than prior group session.   Participation Level: Active and Engaged   Participation Quality: Minimal Cues and Moderate Cues   Behavior: Alert, Appropriate, Attentive , and Calm   Speech/Thought Process: Relevant   Affect/Mood: Appropriate and Flat   Insight: Poor   Judgement: Poor   Modes of Intervention: Activity, Clarification, Discussion, Education, Exploration, Problem-solving, Rapport Building, Socialization, and Support  Patient Response to Interventions:  Attentive, Engaged, and Receptive   Plan: Continue to engage patient in  PT/OT groups 1 - 2x/week.   Romie Tay R., MPH, MS, OTR/L ascom [phone] 06/08/24, 2:18 PM

## 2024-06-08 NOTE — Progress Notes (Signed)
 SI/HI/AVH: denies all  Mood/Behavior: pleasant/cooperative   Interaction / Group attendance: minimal/ 2 of 3 groups   Medication/ PRNs: compliant/none   Pain: denies

## 2024-06-08 NOTE — Plan of Care (Signed)
   Problem: Education: Goal: Emotional status will improve Outcome: Progressing Goal: Mental status will improve Outcome: Progressing

## 2024-06-08 NOTE — Plan of Care (Signed)
  Problem: Education: Goal: Knowledge of General Education information will improve Description: Including pain rating scale, medication(s)/side effects and non-pharmacologic comfort measures Outcome: Progressing   Problem: Health Behavior/Discharge Planning: Goal: Ability to manage health-related needs will improve Outcome: Progressing   Problem: Clinical Measurements: Goal: Ability to maintain clinical measurements within normal limits will improve Outcome: Progressing Goal: Will remain free from infection Outcome: Progressing Goal: Diagnostic test results will improve Outcome: Progressing Goal: Respiratory complications will improve Outcome: Progressing Goal: Cardiovascular complication will be avoided Outcome: Progressing   Problem: Activity: Goal: Risk for activity intolerance will decrease Outcome: Progressing   Problem: Nutrition: Goal: Adequate nutrition will be maintained Outcome: Progressing   Problem: Coping: Goal: Level of anxiety will decrease Outcome: Progressing   Problem: Elimination: Goal: Will not experience complications related to bowel motility Outcome: Progressing Goal: Will not experience complications related to urinary retention Outcome: Progressing   Problem: Pain Managment: Goal: General experience of comfort will improve and/or be controlled Outcome: Progressing   Problem: Safety: Goal: Ability to remain free from injury will improve Outcome: Progressing   Problem: Skin Integrity: Goal: Risk for impaired skin integrity will decrease Outcome: Progressing   Problem: Education: Goal: Knowledge of [place] [facility] Education information/materials will improve Outcome: Progressing Goal: Emotional status will improve Outcome: Progressing Goal: Mental status will improve Outcome: Progressing Goal: Verbalization of understanding the information provided will improve Outcome: Progressing   Problem: Activity: Goal: Interest or  engagement in activities will improve Outcome: Progressing Goal: Sleeping patterns will improve Outcome: Progressing   Problem: Coping: Goal: Ability to verbalize frustrations and anger appropriately will improve Outcome: Progressing Goal: Ability to demonstrate self-control will improve Outcome: Progressing   Problem: Health Behavior/Discharge Planning: Goal: Identification of resources available to assist in meeting health care needs will improve Outcome: Progressing Goal: Compliance with treatment plan for underlying cause of condition will improve Outcome: Progressing   Problem: Physical Regulation: Goal: Ability to maintain clinical measurements within normal limits will improve Outcome: Progressing   Problem: Safety: Goal: Periods of time without injury will increase Outcome: Progressing

## 2024-06-08 NOTE — Group Note (Signed)
 Recreation Therapy Group Note   Group Topic:Leisure Education  Group Date: 06/08/2024 Start Time: 1500 End Time: 1600 Facilitators: Celestia Jeoffrey BRAVO, LRT, CTRS Location: Courtyard  Group Description: Music. Patients are encouraged to name their favorite song(s) for LRT to play song through speaker for group to hear, while in the courtyard getting fresh air and sunlight. Patients educated on the definition of leisure and the importance of having different leisure interests outside of the hospital. Group discussed how leisure activities can often be used as Pharmacologist and that listening to music and being outside are examples.    Goal Area(s) Addressed:  Patient will identify a current leisure interest.  Patient will practice making a positive decision. Patient will have the opportunity to try a new leisure activity.   Affect/Mood: N/A   Participation Level: Did not attend    Clinical Observations/Individualized Feedback: Patient did not attend group.   Plan: Continue to engage patient in RT group sessions 2-3x/week.   Jeoffrey BRAVO Celestia, LRT, CTRS 06/08/2024 5:04 PM

## 2024-06-08 NOTE — Group Note (Signed)
 Date:  06/08/2024 Time:  8:50 PM  Group Topic/Focus:  Goals Group:   The focus of this group is to help patients establish daily goals to achieve during treatment and discuss how the patient can incorporate goal setting into their daily lives to aide in recovery.    Participation Level:  Active  Participation Quality:  Appropriate  Affect:  Appropriate  Cognitive:  Appropriate  Insight: Good  Engagement in Group:  Engaged  Modes of Intervention:  Discussion  Additional Comments:    Benjamin [name] 06/08/2024, 8:50 PM

## 2024-06-08 NOTE — Progress Notes (Signed)
   06/08/24 2100  Psych Admission Type (Psych Patients Only)  Admission Status Voluntary  Psychosocial Assessment  Patient Complaints None  Eye Contact Fair  Facial Expression Animated  Affect Appropriate to circumstance  Speech Logical/coherent  Interaction Minimal  Motor Activity Slow  Appearance/Hygiene In scrubs  Behavior Characteristics Calm;Cooperative  Mood Pleasant  Thought Process  Coherency WDL  Content WDL  Delusions None reported or observed  Perception WDL  Hallucination None reported or observed  Judgment Impaired  Confusion Mild  Danger to Self  Current suicidal ideation? Denies  Self-Injurious Behavior No self-injurious ideation or behavior indicators observed or expressed   Danger to Others  Danger to Others None reported or observed   Mood/Behavior:  Pleasant and cooperative.  Mild confusion.    Psych assessment: Denies SI/HI and AVH.     Interaction / Group attendance:  Present in the milieu. Minimal interaction with peers and staff.  Attended group.   Medication/ PRNs: Compliant with scheduled medications. No PRNs given.   Pain: Denies  Sleep hours:   15 min checks in place for safety.

## 2024-06-08 NOTE — BH IP Treatment Plan (Signed)
 Interdisciplinary Treatment and Diagnostic Plan Update  06/08/2024 Time of Session: 10:00 AM  Benjamin [name] MRN: [id number]  Principal Diagnosis: Delusional disorder [facility])  Secondary Diagnoses: Principal Problem:   Delusional disorder (HCC)   Current Medications:  Current Facility-Administered Medications  Medication Dose Route Frequency Provider Last Rate Last Admin   acetaminophen  (TYLENOL ) tablet 650 mg  650 mg Oral Q6H PRN Coleman, Carolyn H, NP       alum & mag hydroxide-simeth (MAALOX/MYLANTA) 200-200-20 MG/5ML suspension 30 mL  30 mL Oral Q4H PRN Coleman, Carolyn H, NP       docusate sodium  (COLACE) capsule 100 mg  100 mg Oral Daily Jadapalle, Sree, MD   100 mg at 06/08/24 9074   fluPHENAZine  (PROLIXIN ) tablet 10 mg  10 mg Oral BID Shrivastava, Aryendra, MD   10 mg at 06/08/24 9074   Or   fluPHENAZine  (PROLIXIN ) injection 5 mg  5 mg Intramuscular BID Shrivastava, Aryendra, MD       magnesium  hydroxide (MILK OF MAGNESIA) suspension 30 mL  30 mL Oral Daily PRN Mardy Elveria DEL, NP       OLANZapine  (ZYPREXA ) injection 5 mg  5 mg Intramuscular TID PRN Coleman, Carolyn H, NP   5 mg at 04/12/24 2230   OLANZapine  zydis (ZYPREXA ) disintegrating tablet 5 mg  5 mg Oral TID PRN Coleman, Carolyn H, NP       polyethylene glycol (MIRALAX  / GLYCOLAX ) packet 17 g  17 g Oral Daily PRN Jadapalle, Sree, MD   17 g at 06/04/24 2123   PTA Medications: Medications Prior to Admission  Medication Sig Dispense Refill Last Dose/Taking   docusate sodium  (COLACE) 100 MG capsule Take 100 mg by mouth daily.      omega-3 acid ethyl esters (LOVAZA) 1 g capsule Take 1 g by mouth daily.       Patient Stressors: Traumatic event    Patient Strengths: Communication skills   Treatment Modalities: Medication Management, Group therapy, Case management,  1 to 1 session with clinician, Psychoeducation, Recreational therapy.   Physician Treatment Plan for Primary Diagnosis: Delusional disorder [facility]) Long  Term Goal(s): Improvement in symptoms so as ready for discharge   Short Term Goals: Ability to identify changes in lifestyle to reduce recurrence of condition will improve Ability to verbalize feelings will improve Ability to disclose and discuss suicidal ideas Ability to demonstrate self-control will improve Ability to identify and develop effective coping behaviors will improve  Medication Management: Evaluate patient's response, side effects, and tolerance of medication regimen.  Therapeutic Interventions: 1 to 1 sessions, Unit Group sessions and Medication administration.  Evaluation of Outcomes: Not Progressing  Physician Treatment Plan for Secondary Diagnosis: Principal Problem:   Delusional disorder (HCC)  Long Term Goal(s): Improvement in symptoms so as ready for discharge   Short Term Goals: Ability to identify changes in lifestyle to reduce recurrence of condition will improve Ability to verbalize feelings will improve Ability to disclose and discuss suicidal ideas Ability to demonstrate self-control will improve Ability to identify and develop effective coping behaviors will improve     Medication Management: Evaluate patient's response, side effects, and tolerance of medication regimen.  Therapeutic Interventions: 1 to 1 sessions, Unit Group sessions and Medication administration.  Evaluation of Outcomes: Not Progressing   RN Treatment Plan for Primary Diagnosis: Delusional disorder [facility]) Long Term Goal(s): Knowledge of disease and therapeutic regimen to maintain health will improve  Short Term Goals: Ability to remain free from injury will improve, Ability to verbalize frustration and  anger appropriately will improve, Ability to demonstrate self-control, Ability to participate in decision making will improve, Ability to verbalize feelings will improve, Ability to disclose and discuss suicidal ideas, Ability to identify and develop effective coping behaviors will  improve, and Compliance with prescribed medications will improve  Medication Management: RN will administer medications as ordered by provider, will assess and evaluate patient's response and provide education to patient for prescribed medication. RN will report any adverse and/or side effects to prescribing provider.  Therapeutic Interventions: 1 on 1 counseling sessions, Psychoeducation, Medication administration, Evaluate responses to treatment, Monitor vital signs and CBGs as ordered, Perform/monitor CIWA, COWS, AIMS and Fall Risk screenings as ordered, Perform wound care treatments as ordered.  Evaluation of Outcomes: Not Progressing   LCSW Treatment Plan for Primary Diagnosis: Delusional disorder [facility]) Long Term Goal(s): Safe transition to appropriate next level of care at discharge, Engage patient in therapeutic group addressing interpersonal concerns.  Short Term Goals: Engage patient in aftercare planning with referrals and resources, Increase social support, Increase ability to appropriately verbalize feelings, Increase emotional regulation, Facilitate acceptance of mental health diagnosis and concerns, Facilitate patient progression through stages of change regarding substance use diagnoses and concerns, Identify triggers associated with mental health/substance abuse issues, and Increase skills for wellness and recovery  Therapeutic Interventions: Assess for all discharge needs, 1 to 1 time with Social worker, Explore available resources and support systems, Assess for adequacy in community support network, Educate family and significant other(s) on suicide prevention, Complete Psychosocial Assessment, Interpersonal group therapy.  Evaluation of Outcomes: Not Progressing   Progress in Treatment: Attending groups: Yes. and No. Participating in groups: Yes. and No. Taking medication as prescribed: Yes. Toleration medication: Yes. Family/Significant other contact made: Yes,  individual(s) contacted:  [name], borther, [phone]  Patient understands diagnosis: No. Discussing patient identified problems/goals with staff: Yes. Medical problems stabilized or resolved: Yes. Denies suicidal/homicidal ideation: Yes. Issues/concerns per patient self-inventory: No. Other: None   New problem(s) identified: No, Describe:  05/14/24 Update: None  05/24/24 Update: No changes at this time.  Update 05/29/2024:  No changes at this time.  Update 06/03/2024:  No changes at this time. Update 06/08/24: No changes at this time    New Short Term/Long Term Goal(s): elimination of symptoms of psychosis, medication management for mood stabilization; elimination of SI thoughts; development of comprehensive mental wellness plan. Update 04/02/24: No changes at this time. Update 04/07/24: No changes at this time. Update 04/12/24: No changes at this time Update 04/18/24: No changes at this time  Update 04/23/24: No changes at this time. Update 04/28/2024: No changes at this time.  Update 05/04/2024: No changes at this time.  Update 05/09/2024:  No changes at this time. 05/14/24 Update: No changes at this time. 05/19/24 Update: No changes at this time. 05/24/24 Update: No changes at this time. Update 05/29/2024:  No changes at this time. Update 06/03/2024:  No changes at this time. Update 06/08/24: No changes at this time    Patient Goals:  Get out of here, I want to get healed up so I can get out of here Update 04/02/24: No changes at this time. Update 04/07/24: No changes at this time .Update 04/12/24: No changes at this time Update 04/18/24: No changes at this time  Update 04/23/24: No changes at this time. Update 04/28/2024: No changes at this time. Update 05/04/2024: No changes at this time.   Update 05/29/2024:  No changes at this time.  Update 06/03/2024:  No changes at  this time. Update 06/08/24: No changes at this time    Discharge Plan or Barriers: CSW will assist with appropriate discharge planning   Update 04/02/24: No changes at this time. Update 04/07/24: No changes at this time.Update 04/12/24: No changes at this time Update 04/18/24: CSW submitted report to APS. Care team looking at guardianship for pt at this time  Update 04/23/24: No changes at this time. Update 04/28/2024: No changes at this time. Update 05/04/2024: No changes at this time.  Update 05/09/2024:  [facility] APS continues to search for placement. 05/14/24 Update: [facility] APS continues to look for placement. CSW to continue to assess. 05/19/24 Update: No changes at this time. 05/24/24 Update:DSS continues to look for placement at this time Update 05/29/2024:  Patient remains on the unit and safe at this time.  Case was accepted by APS [name] and they are looking for placement for the patient.   Update 06/03/2024:  No changes at this time. Update 06/08/24: No changes at this time    Reason for Continuation of Hospitalization: Delusions  Medication stabilization   Estimated Length of Stay: 1 to 7 days Update 04/02/24: TBD. Update 04/07/24: TBD Update 04/12/24:TBD. Update 04/28/24:TBD Update 05/04/2024: TBD Update 05/09/2024:  TBD  05/14/24 Update: TBD. 05/19/24 Update: TBD. 05/24/24 Update: TBD  Update 05/29/2024:  TBD.  Update 06/03/2024:  TBD Update 06/08/24: TBD  Last 3 [place] Suicide Severity Risk Score: Flowsheet Row Admission (Current) from 03/27/2024 in [facility] ED from 03/26/2024 in [facility] Emergency Department [facility] ED from 03/25/2024 in [facility]  C-SSRS RISK CATEGORY No Risk No Risk No Risk    Last [facility] 2/9 Scores:     No data to display          Scribe for Treatment Team: [name], ISRAEL 06/08/2024 10:22 AM

## 2024-06-08 NOTE — Group Note (Signed)
 Date:  06/08/2024 Time:  11:26 AM  Group Topic/Focus:  Diagnosis Education:   The focus of this group is to discuss the major disorders that patients maybe diagnosed with.  Group discusses the importance of knowing what one's diagnosis is so that one can understand treatment and better advocate for oneself. Emotional Education:   The focus of this group is to discuss what feelings/emotions are, and how they are experienced. Managing Feelings:   The focus of this group is to identify what feelings patients have difficulty handling and develop a plan to handle them in a healthier way upon discharge.    Participation Level:  Active  Participation Quality:  Appropriate  Affect:  Appropriate  Cognitive:  Appropriate  Insight: Good  Engagement in Group:  None  Modes of Intervention:  Activity and Discussion  Additional Comments:  N/A  [name] Gavel 06/08/2024, 11:26 AM

## 2024-06-09 DIAGNOSIS — F0390 Unspecified dementia without behavioral disturbance: Secondary | ICD-10-CM | POA: Diagnosis not present

## 2024-06-09 DIAGNOSIS — F22 Delusional disorders: Secondary | ICD-10-CM | POA: Diagnosis not present

## 2024-06-09 MED ORDER — INFLUENZA VAC SPLIT HIGH-DOSE 0.5 ML IM SUSY
0.5000 mL | PREFILLED_SYRINGE | INTRAMUSCULAR | Status: AC
  Administered 2024-06-14: 0.5 mL via INTRAMUSCULAR
  Filled 2024-06-09: qty 0.5

## 2024-06-09 NOTE — Plan of Care (Signed)
   Problem: Activity: Goal: Interest or engagement in activities will improve Outcome: Progressing Goal: Sleeping patterns will improve Outcome: Progressing

## 2024-06-09 NOTE — Progress Notes (Signed)
 SI/HI/AVH: denies all  Behavior/Mood: cooperative/pleasant    Interaction/Group attendance: minimal/ 0 of 2 groups   Medication/PRNs: compliant/no PRNs   Pain: denies  Other: Per MD, pt requesting flu vaccine - scheduled for tomorrow 10.19

## 2024-06-09 NOTE — Group Note (Signed)
 Date:  06/09/2024 Time:  4:24 PM  Group Topic/Focus:  Personal Choices and Values:   The focus of this group is to help patients assess and explore the importance of values in their lives, how their values affect their decisions, how they express their values and what opposes their expression.    Participation Level:  Did Not Attend   Benjamin [name] 06/09/2024, 4:24 PM

## 2024-06-09 NOTE — Group Note (Signed)
 Date:  06/09/2024 Time:  10:08 PM  Group Topic/Focus:  Healthy Communication:   The focus of this group is to discuss communication, barriers to communication, as well as healthy ways to communicate with others.    Participation Level:  Active  Participation Quality:  Appropriate  Affect:  Appropriate  Cognitive:  Appropriate  Insight: Good  Engagement in Group:  Engaged  Modes of Intervention:  Discussion  Additional Comments:    Benjamin [name] 06/09/2024, 10:08 PM

## 2024-06-09 NOTE — Progress Notes (Signed)
 [facility] MD Progress Note  06/09/2024 8:00 PM Benjamin [name]  MRN:  [id number] Benjamin [name] is a 78 year old male who presents to the inpatient geriatric psych unit after jumping from a two story building. Patient was originally seen at [facility] who referred him to the ED who admitted him here on 03/27/2024. Patient reports that people from the [place] gang were trying to kill him and the only escape was through the bedroom window on the second floor. He currently lives in the house with [name] and his family and has been living with them for 3-4 years without any problems. On 03/19/2024 he went to the doctor and when he got back home the family wouldn't let him leave. He states that after he realized the gang was going to kill him he barricaded the door with his bed and jumped out the window. When he landed he ran away until one of his neighbors found him and called 911.   Subjective: Chart is reviewed and discussed with the treatment team.  APS is working on legal guardianship and placement.  He has guardianship hearing June 19, 2024  Patient is noted to be participating in the groups.  He offers no complaints.  Per nursing he has fair appetite and sleep.  Patient denies SI/HI/plan.  Patient denies auditory/visual hallucinations. Patient was asking about flu shot today    Capacity :Able to understand medical problem Patient is unable to display any understanding of his medical problems or the current presentation leading up to admission Able to understand proposed treatment  Patient is unable to understand the proposed treatment with antipsychotics Patient remains paranoid and has cognitive impairment as he is unable to discuss his discharge planning.  Given his paranoia and delay in executive function patient is unable to make decisions regarding the treatment and disposition.    Sleep: Fair  Appetite:  Fair  Past Psychiatric History: see h&P  Family History:   Family History  Problem Relation Age of Onset   Colon cancer Neg Hx    Colon polyps Neg Hx    Stomach cancer Neg Hx    Esophageal cancer Neg Hx    Social History:  Social History   Substance and Sexual Activity  Alcohol Use No     Social History   Substance and Sexual Activity  Drug Use No    Social History   Socioeconomic History   Marital status: Single    Spouse name: Not on file   Number of children: Not on file   Years of education: Not on file   Highest education level: Not on file  Occupational History   Not on file  Tobacco Use   Smoking status: Former    Types: Cigarettes   Smokeless tobacco: Never  Vaping Use   Vaping status: Never Used  Substance and Sexual Activity   Alcohol use: No   Drug use: No   Sexual activity: Not on file  Other Topics Concern   Not on file  Social History Narrative   Not on file   Social Drivers of Health   Financial Resource Strain: Not on file  Food Insecurity: No Food Insecurity (03/27/2024)   Hunger Vital Sign    Worried About Running Out of Food in the Last Year: Never true    Ran Out of Food in the Last Year: Never true  Transportation Needs: No Transportation Needs (03/27/2024)   PRAPARE - Administrator, Civil Service (Medical): No  Lack of Transportation (Non-Medical): No  Physical Activity: Not on file  Stress: Not on file  Social Connections: Moderately Integrated (03/27/2024)   Social Connection and Isolation Panel    Frequency of Communication with Friends and Family: Twice a week    Frequency of Social Gatherings with Friends and Family: Once a week    Attends Religious Services: 1 to 4 times per year    Active Member of [employer] or Organizations: No    Attends Engineer, structural: 1 to 4 times per year    Marital Status: Divorced   Past Medical History:  Past Medical History:  Diagnosis Date   Allergy    Arthritis    back   Prostate cancer (HCC) dx'd 2010   surg only    Past  Surgical History:  Procedure Laterality Date   COLONOSCOPY     EYE SURGERY     PROSTATE SURGERY      Current Medications: Current Facility-Administered Medications  Medication Dose Route Frequency Provider Last Rate Last Admin   acetaminophen  (TYLENOL ) tablet 650 mg  650 mg Oral Q6H PRN Coleman, Carolyn H, NP       alum & mag hydroxide-simeth (MAALOX/MYLANTA) 200-200-20 MG/5ML suspension 30 mL  30 mL Oral Q4H PRN Coleman, Carolyn H, NP       docusate sodium  (COLACE) capsule 100 mg  100 mg Oral Daily Jadapalle, Sree, MD   100 mg at 06/09/24 0920   fluPHENAZine  (PROLIXIN ) tablet 10 mg  10 mg Oral BID Shrivastava, Aryendra, MD   10 mg at 06/09/24 0920   Or   fluPHENAZine  (PROLIXIN ) injection 5 mg  5 mg Intramuscular BID Shrivastava, Aryendra, MD       [START ON 06/10/2024] Influenza vac split trivalent PF (FLUZONE HIGH-DOSE) injection 0.5 mL  0.5 mL Intramuscular Tomorrow-1000 Assunta Pupo R, MD       magnesium  hydroxide (MILK OF MAGNESIA) suspension 30 mL  30 mL Oral Daily PRN Coleman, Carolyn H, NP       OLANZapine  (ZYPREXA ) injection 5 mg  5 mg Intramuscular TID PRN Mardy Elveria DEL, NP   5 mg at 04/12/24 2230   OLANZapine  zydis (ZYPREXA ) disintegrating tablet 5 mg  5 mg Oral TID PRN Mardy Elveria DEL, NP       polyethylene glycol (MIRALAX  / GLYCOLAX ) packet 17 g  17 g Oral Daily PRN Donnelly Mellow, MD   17 g at 06/04/24 2123    Lab Results:  No results found for this or any previous visit (from the past 48 hours).    Blood Alcohol level:  Lab Results  Component Value Date   [facility] <15 03/26/2024    Metabolic Disorder Labs: Lab Results  Component Value Date   HGBA1C 5.6 04/02/2024   MPG 114 04/02/2024   No results found for: PROLACTIN Lab Results  Component Value Date   CHOL 146 04/02/2024   TRIG 37 04/02/2024   HDL 43 04/02/2024   CHOLHDL 3.4 04/02/2024   VLDL 7 04/02/2024   LDLCALC 96 04/02/2024    Physical Findings: AIMS:  , ,  ,  ,    CIWA:    COWS:       Psychiatric Specialty Exam:  Presentation  General Appearance:  Appropriate for Environment; Bizarre  Eye Contact: Fair  Speech: Clear and Coherent  Speech Volume: Normal    Mood and Affect  Mood: Anxious  Affect: Congruent   Thought Process  Thought Processes: Disorganized  Descriptions of Associations:Intact  Orientation:Full (Time,  Place and Person)  Thought Content: Paranoia at baseline Hallucinations: Denies  Ideas of Reference: Paranoia at baseline Suicidal Thoughts: Denies  Homicidal Thoughts: Denies   Sensorium  Memory: impiared  Judgment: Impaired  Insight: Shallow   Executive Functions  Concentration: Fair  Attention Span: Fair  Recall: [employer] of Knowledge: Fair  Language: Fair   Psychomotor Activity  Psychomotor Activity: No data recorded  Musculoskeletal: Strength & Muscle Tone: within normal limits Gait & Station: normal Assets  Assets: Manufacturing systems engineer; Resilience    Physical Exam: Physical Exam Vitals and nursing note reviewed.    ROS Blood pressure 113/81, pulse 83, temperature (!) 97.4 F (36.3 C), temperature source Temporal, resp. rate 18, height 6' (1.829 m), weight 100 kg, SpO2 100%. Body mass index is 29.91 kg/m.  Diagnosis: Principal Problem:   Delusional disorder [facility])  Major Neurocognitive disorder  Clinical Decision Making: Patient currently admitted after jumping off a two-story building in the context of possible delusions as reported as the people in the house he lived for 4 years trying to kill him.  Patient needs to be monitored closely for ongoing psychosis and paranoid delusions.  Given patient's confusion and chronic paranoia, no family support, APS report has been made and it got screened in for further evaluation by APS.   Differential diagnosis include: unspecified psychosis, delusion disorder, dementia, schizophrenia/schizoaffective disorder   Treatment Plan  Summary: Requested the social work team to reach out to APS for safety concerns in the community as patient has chronic paranoia and confusion and is currently homeless patient remains confused and paranoid and spite of being compliant with medications.  Patient displays cognitive delay and deficits in executive function like planning, organizing and processing information.  At this point patient lacks capacity to make medical decisions.  We recommend APS referral for possible legal guardianship as he has no family or legal next of kin to help with decision-making.   Safety and Monitoring:             -- InVoluntary admission to inpatient psychiatric unit for safety, stabilization and treatment             -- Daily contact with patient to assess and evaluate symptoms and progress in treatment             -- Patient's case to be discussed in multi-disciplinary team meeting             -- Observation Level: q15 minute checks             -- Vital signs:  q12 hours             -- Precautions: suicide, elopement, and assault   2. Psychiatric Diagnoses and Treatment:              Continue Prolixin  to 10 mg twice daily. discontinued Zyprexa .  -- The risks/benefits/side-effects/alternatives to this medication were discussed in detail with the patient and time was given for questions. The patient consents to medication trial.                -- Metabolic profile and EKG monitoring obtained while on an atypical antipsychotic (BMI: Lipid Panel: HbgA1c: QTc:)              -- Encouraged patient to participate in unit milieu and in scheduled group therapies  3. Medical Issues Being Addressed:    No urgent medical needs noted  4. Discharge Planning:   -- Social work and case management to assist with discharge planning and identification of hospital follow-up needs prior to discharge  -- Estimated LOS: 3-4 days  Millie JONELLE Manners, MD 06/09/2024, 8:00 PM

## 2024-06-09 NOTE — Group Note (Unsigned)
 Date:  06/09/2024 Time:  11:20 AM  Group Topic/Focus:  Managing Feelings:   The focus of this group is to identify what feelings patients have difficulty handling and develop a plan to handle them in a healthier way upon discharge. Rediscovering Joy:   The focus of this group is to explore various ways to relieve stress in a positive manner.     Participation Level:  {BHH PARTICIPATION OZCZO:77735}  Participation Quality:  {BHH PARTICIPATION QUALITY:22265}  Affect:  {BHH AFFECT:22266}  Cognitive:  {BHH COGNITIVE:22267}  Insight: {BHH Insight2:20797}  Engagement in Group:  {BHH ENGAGEMENT IN HMNLE:77731}  Modes of Intervention:  {BHH MODES OF INTERVENTION:22269}  Additional Comments:  ***  [name] 06/09/2024, 11:20 AM

## 2024-06-09 NOTE — Group Note (Signed)
 Date:  06/09/2024 Time:  1:01 PM  Group Topic/Focus:  Conflict Resolution:   The focus of this group is to discuss the conflict resolution process and how it may be used upon discharge. Personal Choices and Values:   The focus of this group is to help patients assess and explore the importance of values in their lives, how their values affect their decisions, how they express their values and what opposes their expression. Stages of Change:   The focus of this group is to explain the stages of change and help patients identify changes they want to make upon discharge.    Participation Level:  Did Not Attend   Benjamin [name] 06/09/2024, 1:01 PM

## 2024-06-10 DIAGNOSIS — F0390 Unspecified dementia without behavioral disturbance: Secondary | ICD-10-CM | POA: Diagnosis not present

## 2024-06-10 DIAGNOSIS — F22 Delusional disorders: Secondary | ICD-10-CM | POA: Diagnosis not present

## 2024-06-10 NOTE — Plan of Care (Signed)
  Problem: Education: Goal: Knowledge of General Education information will improve Description: Including pain rating scale, medication(s)/side effects and non-pharmacologic comfort measures Outcome: Progressing   Problem: Health Behavior/Discharge Planning: Goal: Ability to manage health-related needs will improve Outcome: Progressing   Problem: Clinical Measurements: Goal: Ability to maintain clinical measurements within normal limits will improve Outcome: Progressing Goal: Will remain free from infection Outcome: Progressing Goal: Diagnostic test results will improve Outcome: Progressing Goal: Respiratory complications will improve Outcome: Progressing Goal: Cardiovascular complication will be avoided Outcome: Progressing   Problem: Activity: Goal: Risk for activity intolerance will decrease Outcome: Progressing   Problem: Nutrition: Goal: Adequate nutrition will be maintained Outcome: Progressing   Problem: Coping: Goal: Level of anxiety will decrease Outcome: Progressing   Problem: Elimination: Goal: Will not experience complications related to bowel motility Outcome: Progressing Goal: Will not experience complications related to urinary retention Outcome: Progressing   Problem: Pain Managment: Goal: General experience of comfort will improve and/or be controlled Outcome: Progressing   Problem: Safety: Goal: Ability to remain free from injury will improve Outcome: Progressing   Problem: Skin Integrity: Goal: Risk for impaired skin integrity will decrease Outcome: Progressing   Problem: Education: Goal: Knowledge of [place] [facility] Education information/materials will improve Outcome: Progressing Goal: Emotional status will improve Outcome: Progressing Goal: Mental status will improve Outcome: Progressing Goal: Verbalization of understanding the information provided will improve Outcome: Progressing   Problem: Activity: Goal: Interest or  engagement in activities will improve Outcome: Progressing Goal: Sleeping patterns will improve Outcome: Progressing   Problem: Coping: Goal: Ability to verbalize frustrations and anger appropriately will improve Outcome: Progressing Goal: Ability to demonstrate self-control will improve Outcome: Progressing   Problem: Health Behavior/Discharge Planning: Goal: Identification of resources available to assist in meeting health care needs will improve Outcome: Progressing Goal: Compliance with treatment plan for underlying cause of condition will improve Outcome: Progressing   Problem: Physical Regulation: Goal: Ability to maintain clinical measurements within normal limits will improve Outcome: Progressing   Problem: Safety: Goal: Periods of time without injury will increase Outcome: Progressing

## 2024-06-10 NOTE — Progress Notes (Signed)
 Patient is an involuntary admission to [name] for psychosis after jumping off 2 story building in August. Currently patient is pleasant, cooperative, takes all his medications as ordered. Ambulates without assistance and no longer needs the post op boot for his foot. No complaints of pain.  Completes all his own ADL's. Participates in some groups. Denies SI, HI, AVH, anxiety and depression.  Awaiting guardianship and placement by APS.

## 2024-06-10 NOTE — Group Note (Signed)
 Date:  06/10/2024 Time:  9:53 PM  Group Topic/Focus:  Wrap-Up Group:   The focus of this group is to help patients review their daily goal of treatment and discuss progress on daily workbooks.    Participation Level:  Did Not Attend  Participation Quality:     Affect:     Cognitive:     Insight: None  Engagement in Group:     Modes of Intervention:     Additional Comments:    Hermila Millis K 06/10/2024, 9:53 PM

## 2024-06-10 NOTE — Group Note (Signed)
 Date:  06/10/2024 Time:  10:37 AM  Group Topic/Focus:  Coping With Mental Health Crisis:   The purpose of this group is to help patients identify strategies for coping with mental health crisis.  Group discusses possible causes of crisis and ways to manage them effectively.    Participation Level:  Did Not Attend  Leigh VEAR Pais 06/10/2024, 10:37 AM

## 2024-06-10 NOTE — Progress Notes (Incomplete)
   06/10/24 2100  Psych Admission Type (Psych Patients Only)  Admission Status Voluntary  Psychosocial Assessment  Patient Complaints None  Eye Contact Fair  Facial Expression Animated  Affect Appropriate to circumstance  Speech Logical/coherent  Interaction Minimal  Motor Activity Slow  Appearance/Hygiene In scrubs  Behavior Characteristics Cooperative  Mood Pleasant  Thought Process  Coherency WDL  Content WDL  Delusions None reported or observed  Perception WDL  Hallucination None reported or observed  Judgment Impaired  Confusion Mild  Danger to Self  Current suicidal ideation? Denies  Agreement Not to Harm Self Yes  Description of Agreement verbal  Danger to Others  Danger to Others None reported or observed   Mood/Behavior:  Pleasant and cooperative.    Psych assessment: Denies SI/HI and AVH.     Interaction / Group attendance:  Present in the milieu. Minimal interaction with peers and staff.  Attended group.   Medication/ PRNs: Compliant with scheduled medications.No PRNs required.   Pain: Denies   Sleep hours:   15 min checks in place for safety.

## 2024-06-10 NOTE — Group Note (Signed)
 [facility] LCSW Group Therapy Note   Group Date: 06/10/2024 Start Time: 1430 End Time: 1530   Type of Therapy and Topic: Group Therapy: Avoiding Self-Sabotaging and Enabling Behaviors  Participation Level: Did Not Attend  Mood: X  Description of Group:  In this group, patients will learn how to identify obstacles, self-sabotaging and enabling behaviors, as well as: what are they, why do we do them and what needs these behaviors meet. Discuss unhealthy relationships and how to have positive healthy boundaries with those that sabotage and enable. Explore aspects of self-sabotage and enabling in yourself and how to limit these self-destructive behaviors in everyday life.   Therapeutic Goals: 1. Patient will identify one obstacle that relates to self-sabotage and enabling behaviors 2. Patient will identify one personal self-sabotaging or enabling behavior they did prior to admission 3. Patient will state a plan to change the above identified behavior 4. Patient will demonstrate ability to communicate their needs through discussion and/or role play.    Summary of Patient Progress:   X   Therapeutic Modalities:  Cognitive Behavioral Therapy Person-Centered Therapy Motivational Interviewing    Rexene LELON Mae, LCSWA

## 2024-06-10 NOTE — Progress Notes (Signed)
 [facility] MD Progress Note  06/10/2024 12:48 PM Benjamin [name]  MRN:  [id number] Benjamin [name] is a 78 year old male who presents to the inpatient geriatric psych unit after jumping from a two story building. Patient was originally seen at [facility] who referred him to the ED who admitted him here on 03/27/2024. Patient reports that people from the [employer] gang were trying to kill him and the only escape was through the bedroom window on the second floor. He currently lives in the house with [name] and his family and has been living with them for 3-4 years without any problems. On 03/19/2024 he went to the doctor and when he got back home the family wouldn't let him leave. He states that after he realized the gang was going to kill him he barricaded the door with his bed and jumped out the window. When he landed he ran away until one of his neighbors found him and called 911.   Subjective: Chart is reviewed and discussed with the treatment team.  APS is working on legal guardianship and placement.  He has guardianship hearing June 19, 2024  Patient is noted to be participating in the groups.  He offers no complaints.  Per nursing he has fair appetite and sleep.  Patient denies SI/HI/plan.  Patient denies auditory/visual hallucinations. Patient was asking about flu shot today    Capacity :Able to understand medical problem Patient is unable to display any understanding of his medical problems or the current presentation leading up to admission Able to understand proposed treatment  Patient is unable to understand the proposed treatment with antipsychotics Patient remains paranoid and has cognitive impairment as he is unable to discuss his discharge planning.  Given his paranoia and delay in executive function patient is unable to make decisions regarding the treatment and disposition.    Sleep: Fair  Appetite:  Fair  Past Psychiatric History: see h&P  Family History:   Family History  Problem Relation Age of Onset   Colon cancer Neg Hx    Colon polyps Neg Hx    Stomach cancer Neg Hx    Esophageal cancer Neg Hx    Social History:  Social History   Substance and Sexual Activity  Alcohol Use No     Social History   Substance and Sexual Activity  Drug Use No    Social History   Socioeconomic History   Marital status: Single    Spouse name: Not on file   Number of children: Not on file   Years of education: Not on file   Highest education level: Not on file  Occupational History   Not on file  Tobacco Use   Smoking status: Former    Types: Cigarettes   Smokeless tobacco: Never  Vaping Use   Vaping status: Never Used  Substance and Sexual Activity   Alcohol use: No   Drug use: No   Sexual activity: Not on file  Other Topics Concern   Not on file  Social History Narrative   Not on file   Social Drivers of Health   Financial Resource Strain: Not on file  Food Insecurity: No Food Insecurity (03/27/2024)   Hunger Vital Sign    Worried About Running Out of Food in the Last Year: Never true    Ran Out of Food in the Last Year: Never true  Transportation Needs: No Transportation Needs (03/27/2024)   PRAPARE - Administrator, Civil Service (Medical): No  Lack of Transportation (Non-Medical): No  Physical Activity: Not on file  Stress: Not on file  Social Connections: Moderately Integrated (03/27/2024)   Social Connection and Isolation Panel    Frequency of Communication with Friends and Family: Twice a week    Frequency of Social Gatherings with Friends and Family: Once a week    Attends Religious Services: 1 to 4 times per year    Active Member of [employer] or Organizations: No    Attends Engineer, structural: 1 to 4 times per year    Marital Status: Divorced   Past Medical History:  Past Medical History:  Diagnosis Date   Allergy    Arthritis    back   Prostate cancer (HCC) dx'd 2010   surg only    Past  Surgical History:  Procedure Laterality Date   COLONOSCOPY     EYE SURGERY     PROSTATE SURGERY      Current Medications: Current Facility-Administered Medications  Medication Dose Route Frequency Provider Last Rate Last Admin   acetaminophen  (TYLENOL ) tablet 650 mg  650 mg Oral Q6H PRN Coleman, Carolyn H, NP       alum & mag hydroxide-simeth (MAALOX/MYLANTA) 200-200-20 MG/5ML suspension 30 mL  30 mL Oral Q4H PRN Coleman, Carolyn H, NP       docusate sodium  (COLACE) capsule 100 mg  100 mg Oral Daily Jadapalle, Sree, MD   100 mg at 06/10/24 9073   fluPHENAZine  (PROLIXIN ) tablet 10 mg  10 mg Oral BID Shrivastava, Aryendra, MD   10 mg at 06/10/24 9247   Or   fluPHENAZine  (PROLIXIN ) injection 5 mg  5 mg Intramuscular BID Shrivastava, Aryendra, MD       Influenza vac split trivalent PF (FLUZONE HIGH-DOSE) injection 0.5 mL  0.5 mL Intramuscular Tomorrow-1000 Caige Almeda R, MD       magnesium  hydroxide (MILK OF MAGNESIA) suspension 30 mL  30 mL Oral Daily PRN Coleman, Carolyn H, NP       OLANZapine  (ZYPREXA ) injection 5 mg  5 mg Intramuscular TID PRN Mardy Elveria DEL, NP   5 mg at 04/12/24 2230   OLANZapine  zydis (ZYPREXA ) disintegrating tablet 5 mg  5 mg Oral TID PRN Mardy Elveria DEL, NP       polyethylene glycol (MIRALAX  / GLYCOLAX ) packet 17 g  17 g Oral Daily PRN Donnelly Mellow, MD   17 g at 06/04/24 2123    Lab Results:  No results found for this or any previous visit (from the past 48 hours).    Blood Alcohol level:  Lab Results  Component Value Date   [facility] <15 03/26/2024    Metabolic Disorder Labs: Lab Results  Component Value Date   HGBA1C 5.6 04/02/2024   MPG 114 04/02/2024   No results found for: PROLACTIN Lab Results  Component Value Date   CHOL 146 04/02/2024   TRIG 37 04/02/2024   HDL 43 04/02/2024   CHOLHDL 3.4 04/02/2024   VLDL 7 04/02/2024   LDLCALC 96 04/02/2024    Physical Findings: AIMS:  , ,  ,  ,    CIWA:    COWS:      Psychiatric  Specialty Exam:  Presentation  General Appearance:  Appropriate for Environment; Bizarre  Eye Contact: Fair  Speech: Clear and Coherent  Speech Volume: Normal    Mood and Affect  Mood: Anxious  Affect: Congruent   Thought Process  Thought Processes: Disorganized  Descriptions of Associations:Intact  Orientation:Full (Time, Place and Person)  Thought Content: Paranoia at baseline Hallucinations: Denies  Ideas of Reference: Paranoia at baseline Suicidal Thoughts: Denies  Homicidal Thoughts: Denies   Sensorium  Memory: impiared  Judgment: Impaired  Insight: Shallow   Executive Functions  Concentration: Fair  Attention Span: Fair  Recall: [employer] of Knowledge: Fair  Language: Fair   Psychomotor Activity  Psychomotor Activity: No data recorded  Musculoskeletal: Strength & Muscle Tone: within normal limits Gait & Station: normal Assets  Assets: Manufacturing systems engineer; Resilience    Physical Exam: Physical Exam Vitals and nursing note reviewed.    ROS Blood pressure 104/76, pulse 68, temperature 97.6 F (36.4 C), temperature source Skin, resp. rate 16, height 6' (1.829 m), weight 100 kg, SpO2 98%. Body mass index is 29.91 kg/m.  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  Patient needs to be monitored closely for ongoing psychosis and paranoid delusions.  Given patient's confusion and chronic paranoia, no family support, APS report has been made and it got screened in for further evaluation by APS.   Differential diagnosis include: unspecified psychosis, delusion disorder, dementia, schizophrenia/schizoaffective disorder   Treatment Plan Summary: Requested the social  work team to reach out to APS for safety concerns in the community as patient has chronic paranoia and confusion and is currently homeless patient remains confused and paranoid and spite of being compliant with medications.  Patient displays cognitive delay and deficits in executive function like planning, organizing and processing information.  At this point patient lacks capacity to make medical decisions.  We recommend APS referral for possible legal guardianship as he has no family or legal next of kin to help with decision-making.   Safety and Monitoring:             -- InVoluntary admission to inpatient psychiatric unit for safety, stabilization and treatment             -- Daily contact with patient to assess and evaluate symptoms and progress in treatment             -- Patient's case to be discussed in multi-disciplinary team meeting             -- Observation Level: q15 minute checks             -- Vital signs:  q12 hours             -- Precautions: suicide, elopement, and assault   2. Psychiatric Diagnoses and Treatment:              Continue Prolixin  to 10 mg twice daily. discontinued Zyprexa .  -- The risks/benefits/side-effects/alternatives to this medication were discussed in detail with the patient and time was given for questions. The patient consents to medication trial.                -- Metabolic profile and EKG monitoring obtained while on an atypical antipsychotic (BMI: Lipid Panel: HbgA1c: QTc:)              -- Encouraged patient to participate in unit milieu and in scheduled group therapies                            3. Medical Issues Being  Addressed:    No urgent medical needs noted  4. Discharge Planning:   -- Social work and case management to assist with discharge planning and identification of hospital follow-up needs prior to discharge  -- Estimated LOS: 3-4 days  Millie JONELLE Manners, MD 06/10/2024, 12:48 PM

## 2024-06-11 DIAGNOSIS — F0390 Unspecified dementia without behavioral disturbance: Secondary | ICD-10-CM | POA: Diagnosis not present

## 2024-06-11 DIAGNOSIS — F22 Delusional disorders: Secondary | ICD-10-CM | POA: Diagnosis not present

## 2024-06-11 NOTE — Group Note (Signed)
 Date:  06/11/2024 Time:  5:49 PM  Group Topic/Focus:  Self Care:   The focus of this group is to help patients understand the importance of self-care in order to improve or restore emotional, physical, spiritual, interpersonal, and financial health.    Participation Level:  Minimal  Participation Quality:  Appropriate  Affect:  Appropriate  Cognitive:  Appropriate  Insight: Appropriate  Engagement in Group:  Engaged  Modes of Intervention:  Discussion  Additional Comments:    Benjamin [name] Daring 06/11/2024, 5:49 PM

## 2024-06-11 NOTE — Group Note (Signed)
 Date:  06/11/2024 Time:  11:34 AM  Group Topic/Focus:  Managing Feelings:   The focus of this group is to identify what feelings patients have difficulty handling and develop a plan to handle them in a healthier way upon discharge.    Participation Level:  Did Not Attend   Benjamin [name] 06/11/2024, 11:34 AM

## 2024-06-11 NOTE — Group Note (Unsigned)
 Date:  06/11/2024 Time:  5:35 PM  Group Topic/Focus:  Self Care:   The focus of this group is to help patients understand the importance of self-care in order to improve or restore emotional, physical, spiritual, interpersonal, and financial health.     Participation Level:  {[facility] PARTICIPATION OZCZO:77735}  Participation Quality:  {[facility] PARTICIPATION QUALITY:22265}  Affect:  {[facility] AFFECT:22266}  Cognitive:  {[facility] COGNITIVE:22267}  Insight: {[facility] Insight2:20797}  Engagement in Group:  {[facility] ENGAGEMENT IN HMNLE:77731}  Modes of Intervention:  {[facility] MODES OF INTERVENTION:22269}  Additional Comments:  ***  [name] 06/11/2024, 5:35 PM

## 2024-06-11 NOTE — Plan of Care (Signed)
   Problem: Education: Goal: Knowledge of General Education information will improve Description Including pain rating scale, medication(s)/side effects and non-pharmacologic comfort measures Outcome: Progressing   Problem: Health Behavior/Discharge Planning: Goal: Ability to manage health-related needs will improve Outcome: Progressing

## 2024-06-11 NOTE — Group Note (Signed)
 Recreation Therapy Group Note   Group Topic:Other  Group Date: 06/11/2024 Start Time: 1400 End Time: 1440 Facilitators: Celestia Jeoffrey BRAVO, LRT, CTRS Location: Dayroom  Activity Description/Intervention: Therapeutic Drumming. Patients with peers and staff were given the opportunity to engage in a leader facilitated [facility] Group Empowerment Drumming Circle with staff from the [employer], in partnership with [employer]. Teaching laboratory technician and trained [employer], Norleen Mon leading with LRT observing and documenting intervention and pt response. This evidenced-based practice targets 7 areas of health and wellbeing in the human experience including: stress-reduction, exercise, self-expression, camaraderie/support, nurturing, spirituality, and music-making (leisure).    Goal Area(s) Addresses:  Patient will engage in pro-social way in music group.  Patient will follow directions of drum leader on the first prompt. Patient will demonstrate no behavioral issues during group.  Patient will identify if a reduction in stress level occurs as a result of participation in therapeutic drum circle.      Affect/Mood: N/A   Participation Level: Did not attend    Clinical Observations/Individualized Feedback: Patient did not attend group.   Plan: Continue to engage patient in RT group sessions 2-3x/week.   Jeoffrey BRAVO Celestia, LRT, CTRS 06/11/2024 4:36 PM

## 2024-06-11 NOTE — Progress Notes (Signed)
 SI/HI/AVH: denies all  Behavior/Mood: cooperative/pleasant    Interaction/Group attendance: minimal/ no groups attended    Medication/PRNs: compliant/no PRNs   Pain: denies  Other: n/a

## 2024-06-12 DIAGNOSIS — F0390 Unspecified dementia without behavioral disturbance: Secondary | ICD-10-CM | POA: Diagnosis not present

## 2024-06-12 DIAGNOSIS — F22 Delusional disorders: Secondary | ICD-10-CM | POA: Diagnosis not present

## 2024-06-12 NOTE — Group Note (Signed)
 Date:  06/12/2024 Time:  11:26 PM  Group Topic/Focus:  Wrap-Up Group:   The focus of this group is to help patients review their daily goal of treatment and discuss progress on daily workbooks.    Participation Level:  Did Not Attend  Participation Quality:     Affect:     Cognitive:     Insight: None  Engagement in Group:  None  Modes of Intervention:     Additional Comments:    [name] 06/12/2024, 11:26 PM

## 2024-06-12 NOTE — Group Note (Signed)
 [facility] LCSW Group Therapy Note   Group Date: 06/12/2024 Start Time: 1315 End Time: 1400   Type of Therapy/Topic:  Group Therapy:  Emotion Regulation  Participation Level:  Did Not Attend   Mood:  Description of Group:    The purpose of this group is to assist patients in learning to regulate negative emotions and experience positive emotions. Patients will be guided to discuss ways in which they have been vulnerable to their negative emotions. These vulnerabilities will be juxtaposed with experiences of positive emotions or situations, and patients challenged to use positive emotions to combat negative ones. Special emphasis will be placed on coping with negative emotions in conflict situations, and patients will process healthy conflict resolution skills.  Therapeutic Goals: Patient will identify two positive emotions or experiences to reflect on in order to balance out negative emotions:  Patient will label two or more emotions that they find the most difficult to experience:  Patient will be able to demonstrate positive conflict resolution skills through discussion or role plays:   Summary of Patient Progress:   X    Therapeutic Modalities:   Cognitive Behavioral Therapy Feelings Identification Dialectical Behavioral Therapy   [name], LCSWA

## 2024-06-12 NOTE — Group Note (Signed)
 Recreation Therapy Group Note   Group Topic:Animal Assisted Therapy   Group Date: 06/12/2024 Start Time: 1400 End Time: 1450 Facilitators: Celestia Jeoffrey BRAVO, LRT, CTRS Location: Courtyard  Group Description: AAA. Animal-Assisted Activity provides opportunities for motivational, educational, therapeutic and/or recreational benefits to enhance quality of life. Selinda and Rollo visited the unit to interact with patients.   Goal Areas Addressed:  Reduced anxiety and stress Improved mood Increased social interaction Enhanced communication skills Reduced loneliness and isolation Improved emotional regulation   Affect/Mood: N/A   Participation Level: Did not attend    Clinical Observations/Individualized Feedback: Patient did not attend group.   Plan: Continue to engage patient in RT group sessions 2-3x/week.   Jeoffrey BRAVO Celestia, LRT, CTRS 06/12/2024 4:13 PM

## 2024-06-12 NOTE — Progress Notes (Signed)
 [facility] MD Progress Note  06/12/2024 8:15 PM Benjamin [name]  MRN:  [id number] Benjamin [name] is a 78 year old male who presents to the inpatient geriatric psych unit after jumping from a two story building. Patient was originally seen at [facility] who referred him to the ED who admitted him here on 03/27/2024. Patient reports that people from the [place] gang were trying to kill him and the only escape was through the bedroom window on the second floor. He currently lives in the house with Benjamin [name] and his family and has been living with them for 3-4 years without any problems. On 03/19/2024 he went to the doctor and when he got back home the family wouldn't let him leave. He states that after he realized the gang was going to kill him he barricaded the door with his bed and jumped out the window. When he landed he ran away until one of his neighbors found him and called 911.   Subjective: Chart is reviewed and discussed with the treatment team.  APS is working on legal guardianship and placement.  He has guardianship hearing June 19, 2024  Per nursing report patient has no behavioral problems remains compliant with the medications.  He displays ongoing confusion and lack of understanding of the current situation where he has a legal guardian and looking for placement.     Capacity :Able to understand medical problem Patient is unable to display any understanding of his medical problems or the current presentation leading up to admission Able to understand proposed treatment  Patient is unable to understand the proposed treatment with antipsychotics Patient remains paranoid and has cognitive impairment as he is unable to discuss his discharge planning.  Given his paranoia and delay in executive function patient is unable to make decisions regarding the treatment and disposition.    Sleep: Fair  Appetite:  Fair  Past Psychiatric History: see h&P  Family History:  Family  History  Problem Relation Age of Onset   Colon cancer Neg Hx    Colon polyps Neg Hx    Stomach cancer Neg Hx    Esophageal cancer Neg Hx    Social History:  Social History   Substance and Sexual Activity  Alcohol Use No     Social History   Substance and Sexual Activity  Drug Use No    Social History   Socioeconomic History   Marital status: Single    Spouse name: Not on file   Number of children: Not on file   Years of education: Not on file   Highest education level: Not on file  Occupational History   Not on file  Tobacco Use   Smoking status: Former    Types: Cigarettes   Smokeless tobacco: Never  Vaping Use   Vaping status: Never Used  Substance and Sexual Activity   Alcohol use: No   Drug use: No   Sexual activity: Not on file  Other Topics Concern   Not on file  Social History Narrative   Not on file   Social Drivers of Health   Financial Resource Strain: Not on file  Food Insecurity: No Food Insecurity (03/27/2024)   Hunger Vital Sign    Worried About Running Out of Food in the Last Year: Never true    Ran Out of Food in the Last Year: Never true  Transportation Needs: No Transportation Needs (03/27/2024)   PRAPARE - Transportation    Lack of Transportation (Medical): No    Lack  of Transportation (Non-Medical): No  Physical Activity: Not on file  Stress: Not on file  Social Connections: Moderately Integrated (03/27/2024)   Social Connection and Isolation Panel    Frequency of Communication with Friends and Family: Twice a week    Frequency of Social Gatherings with Friends and Family: Once a week    Attends Religious Services: 1 to 4 times per year    Active Member of [employer] or Organizations: No    Attends Engineer, structural: 1 to 4 times per year    Marital Status: Divorced   Past Medical History:  Past Medical History:  Diagnosis Date   Allergy    Arthritis    back   Prostate cancer (HCC) dx'd 2010   surg only    Past Surgical  History:  Procedure Laterality Date   COLONOSCOPY     EYE SURGERY     PROSTATE SURGERY      Current Medications: Current Facility-Administered Medications  Medication Dose Route Frequency Provider Last Rate Last Admin   acetaminophen  (TYLENOL ) tablet 650 mg  650 mg Oral Q6H PRN Coleman, Carolyn H, NP       alum & mag hydroxide-simeth (MAALOX/MYLANTA) 200-200-20 MG/5ML suspension 30 mL  30 mL Oral Q4H PRN Coleman, Carolyn H, NP       docusate sodium  (COLACE) capsule 100 mg  100 mg Oral Daily Leandrew Keech, MD   100 mg at 06/12/24 9092   fluPHENAZine  (PROLIXIN ) tablet 10 mg  10 mg Oral BID Shrivastava, Aryendra, MD   10 mg at 06/12/24 9148   Or   fluPHENAZine  (PROLIXIN ) injection 5 mg  5 mg Intramuscular BID Shrivastava, Aryendra, MD       Influenza vac split trivalent PF (FLUZONE HIGH-DOSE) injection 0.5 mL  0.5 mL Intramuscular Tomorrow-1000 Madaram, Kondal R, MD       magnesium  hydroxide (MILK OF MAGNESIA) suspension 30 mL  30 mL Oral Daily PRN Coleman, Carolyn H, NP       OLANZapine  (ZYPREXA ) injection 5 mg  5 mg Intramuscular TID PRN Mardy Elveria DEL, NP   5 mg at 04/12/24 2230   OLANZapine  zydis (ZYPREXA ) disintegrating tablet 5 mg  5 mg Oral TID PRN Mardy Elveria DEL, NP       polyethylene glycol (MIRALAX  / GLYCOLAX ) packet 17 g  17 g Oral Daily PRN Donnelly Mellow, MD   17 g at 06/04/24 2123    Lab Results:  No results found for this or any previous visit (from the past 48 hours).    Blood Alcohol level:  Lab Results  Component Value Date   [facility] <15 03/26/2024    Metabolic Disorder Labs: Lab Results  Component Value Date   HGBA1C 5.6 04/02/2024   MPG 114 04/02/2024   No results found for: PROLACTIN Lab Results  Component Value Date   CHOL 146 04/02/2024   TRIG 37 04/02/2024   HDL 43 04/02/2024   CHOLHDL 3.4 04/02/2024   VLDL 7 04/02/2024   LDLCALC 96 04/02/2024    Physical Findings: AIMS:  , ,  ,  ,    CIWA:    COWS:      Psychiatric Specialty  Exam:  Presentation  General Appearance:  Appropriate for Environment; Bizarre  Eye Contact: Fair  Speech: Clear and Coherent  Speech Volume: Normal    Mood and Affect  Mood: Anxious  Affect: Congruent   Thought Process  Thought Processes: Disorganized  Descriptions of Associations:Intact  Orientation:Full (Time, Place and Person)  Thought Content: Paranoia at baseline Hallucinations: Denies  Ideas of Reference: Paranoia at baseline Suicidal Thoughts: Denies  Homicidal Thoughts: Denies   Sensorium  Memory: impiared  Judgment: Impaired  Insight: Shallow   Executive Functions  Concentration: Fair  Attention Span: Fair  Recall: [employer] of Knowledge: Fair  Language: Fair   Psychomotor Activity  Psychomotor Activity: No data recorded  Musculoskeletal: Strength & Muscle Tone: within normal limits Gait & Station: normal Assets  Assets: Manufacturing systems engineer; Resilience    Physical Exam: Physical Exam Vitals and nursing note reviewed.    ROS Blood pressure 106/79, pulse 66, temperature 98.1 F (36.7 C), resp. rate 14, height 6' (1.829 m), weight 100 kg, SpO2 100%. Body mass index is 29.91 kg/m.  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  Patient needs to be monitored closely for ongoing psychosis and paranoid delusions.  Given patient's confusion and chronic paranoia, no family support, APS report has been made and it got screened in for further evaluation by APS.   Differential diagnosis include: unspecified psychosis, delusion disorder, dementia, schizophrenia/schizoaffective disorder   Treatment Plan Summary: Requested the social work team to reach out to APS for  safety concerns in the community as patient has chronic paranoia and confusion and is currently homeless patient remains confused and paranoid and spite of being compliant with medications.  Patient displays cognitive delay and deficits in executive function like planning, organizing and processing information.  At this point patient lacks capacity to make medical decisions.  We recommend APS referral for possible legal guardianship as he has no family or legal next of kin to help with decision-making.   Safety and Monitoring:             -- InVoluntary admission to inpatient psychiatric unit for safety, stabilization and treatment             -- Daily contact with patient to assess and evaluate symptoms and progress in treatment             -- Patient's case to be discussed in multi-disciplinary team meeting             -- Observation Level: q15 minute checks             -- Vital signs:  q12 hours             -- Precautions: suicide, elopement, and assault   2. Psychiatric Diagnoses and Treatment:              Continue Prolixin  to 10 mg twice daily. discontinued Zyprexa .  -- The risks/benefits/side-effects/alternatives to this medication were discussed in detail with the patient and time was given for questions. The patient consents to medication trial.                -- Metabolic profile and EKG monitoring obtained while on an atypical antipsychotic (BMI: Lipid Panel: HbgA1c: QTc:)              -- Encouraged patient to participate in unit milieu and in scheduled group therapies                            3. Medical Issues Being Addressed:  No urgent medical needs noted  4. Discharge Planning:   -- Social work and case management to assist with discharge planning and identification of hospital follow-up needs prior to discharge  -- Estimated LOS: 3-4 days  Allyn Foil, MD 06/12/2024, 8:15 PM

## 2024-06-12 NOTE — Progress Notes (Signed)
 [facility] MD Progress Note  06/12/2024 12:03 AM Benjamin [name]  MRN:  [id number] Benjamin [name] is a 78 year old male who presents to the inpatient geriatric psych unit after jumping from a two story building. Patient was originally seen at [facility] who referred him to the ED who admitted him here on 03/27/2024. Patient reports that people from the [place] gang were trying to kill him and the only escape was through the bedroom window on the second floor. He currently lives in the house with [name] and his family and has been living with them for 3-4 years without any problems. On 03/19/2024 he went to the doctor and when he got back home the family wouldn't let him leave. He states that after he realized the gang was going to kill him he barricaded the door with his bed and jumped out the window. When he landed he ran away until one of his neighbors found him and called 911.   Subjective: Chart is reviewed and discussed with the treatment team.  APS is working on legal guardianship and placement.  He has guardianship hearing June 19, 2024  Patient is noted to be sitting in the day area.  He offers no complaints.  He denies SI/HI/plan and denies auditory/visual hallucinations.  Per nursing patient is taking medications with no reported side effects.  Patient expressed his wish of finding a place to go to.  Patient is participating in groups and has no behavioral problems.   Sleep: Fair  Appetite:  Fair  Past Psychiatric History: see h&P  Family History:  Family History  Problem Relation Age of Onset   Colon cancer Neg Hx    Colon polyps Neg Hx    Stomach cancer Neg Hx    Esophageal cancer Neg Hx    Social History:  Social History   Substance and Sexual Activity  Alcohol Use No     Social History   Substance and Sexual Activity  Drug Use No    Social History   Socioeconomic History   Marital status: Single    Spouse name: Not on file   Number of children:  Not on file   Years of education: Not on file   Highest education level: Not on file  Occupational History   Not on file  Tobacco Use   Smoking status: Former    Types: Cigarettes   Smokeless tobacco: Never  Vaping Use   Vaping status: Never Used  Substance and Sexual Activity   Alcohol use: No   Drug use: No   Sexual activity: Not on file  Other Topics Concern   Not on file  Social History Narrative   Not on file   Social Drivers of Health   Financial Resource Strain: Not on file  Food Insecurity: No Food Insecurity (03/27/2024)   Hunger Vital Sign    Worried About Running Out of Food in the Last Year: Never true    Ran Out of Food in the Last Year: Never true  Transportation Needs: No Transportation Needs (03/27/2024)   PRAPARE - Administrator, Civil Service (Medical): No    Lack of Transportation (Non-Medical): No  Physical Activity: Not on file  Stress: Not on file  Social Connections: Moderately Integrated (03/27/2024)   Social Connection and Isolation Panel    Frequency of Communication with Friends and Family: Twice a week    Frequency of Social Gatherings with Friends and Family: Once a week    Attends Religious  Services: 1 to 4 times per year    Active Member of Clubs or Organizations: No    Attends Engineer, structural: 1 to 4 times per year    Marital Status: Divorced   Past Medical History:  Past Medical History:  Diagnosis Date   Allergy    Arthritis    back   Prostate cancer (HCC) dx'd 2010   surg only    Past Surgical History:  Procedure Laterality Date   COLONOSCOPY     EYE SURGERY     PROSTATE SURGERY      Current Medications: Current Facility-Administered Medications  Medication Dose Route Frequency Provider Last Rate Last Admin   acetaminophen  (TYLENOL ) tablet 650 mg  650 mg Oral Q6H PRN Coleman, Carolyn H, NP       alum & mag hydroxide-simeth (MAALOX/MYLANTA) 200-200-20 MG/5ML suspension 30 mL  30 mL Oral Q4H PRN  Coleman, Carolyn H, NP       docusate sodium  (COLACE) capsule 100 mg  100 mg Oral Daily Latonya Knight, MD   100 mg at 06/11/24 9040   fluPHENAZine  (PROLIXIN ) tablet 10 mg  10 mg Oral BID Shrivastava, Aryendra, MD   10 mg at 06/11/24 2005   Or   fluPHENAZine  (PROLIXIN ) injection 5 mg  5 mg Intramuscular BID Shrivastava, Aryendra, MD       Influenza vac split trivalent PF (FLUZONE HIGH-DOSE) injection 0.5 mL  0.5 mL Intramuscular Tomorrow-1000 Madaram, Kondal R, MD       magnesium  hydroxide (MILK OF MAGNESIA) suspension 30 mL  30 mL Oral Daily PRN Coleman, Carolyn H, NP       OLANZapine  (ZYPREXA ) injection 5 mg  5 mg Intramuscular TID PRN Mardy Elveria DEL, NP   5 mg at 04/12/24 2230   OLANZapine  zydis (ZYPREXA ) disintegrating tablet 5 mg  5 mg Oral TID PRN Coleman, Carolyn H, NP       polyethylene glycol (MIRALAX  / GLYCOLAX ) packet 17 g  17 g Oral Daily PRN Donnelly Mellow, MD   17 g at 06/04/24 2123    Lab Results:  No results found for this or any previous visit (from the past 48 hours).    Blood Alcohol level:  Lab Results  Component Value Date   [facility] <15 03/26/2024    Metabolic Disorder Labs: Lab Results  Component Value Date   HGBA1C 5.6 04/02/2024   MPG 114 04/02/2024   No results found for: PROLACTIN Lab Results  Component Value Date   CHOL 146 04/02/2024   TRIG 37 04/02/2024   HDL 43 04/02/2024   CHOLHDL 3.4 04/02/2024   VLDL 7 04/02/2024   LDLCALC 96 04/02/2024    Physical Findings: AIMS:  , ,  ,  ,    CIWA:    COWS:      Psychiatric Specialty Exam:  Presentation  General Appearance:  Appropriate for Environment; Bizarre  Eye Contact: Fair  Speech: Clear and Coherent  Speech Volume: Normal    Mood and Affect  Mood: Fine Affect: Congruent   Thought Process  Thought Processes: Disorganized  Descriptions of Associations:Intact  Orientation:Full (Time, Place and Person)  Thought Content: Paranoia at baseline Hallucinations:  Denies  Ideas of Reference: Paranoia at baseline Suicidal Thoughts: Denies  Homicidal Thoughts: Denies   Sensorium  Memory: impiared  Judgment: Impaired  Insight: Shallow   Executive Functions  Concentration: Fair  Attention Span: Fair  Recall: [employer] of Knowledge: Fair  Language: Fair   Psychomotor Activity  Psychomotor  Activity: No data recorded  Musculoskeletal: Strength & Muscle Tone: within normal limits Gait & Station: normal Assets  Assets: Manufacturing systems engineer; Resilience    Physical Exam: Physical Exam Vitals and nursing note reviewed.    ROS Blood pressure 134/81, pulse 66, temperature 98.6 F (37 C), resp. rate 14, height 6' (1.829 m), weight 100 kg, SpO2 99%. Body mass index is 29.91 kg/m.  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  Patient needs to be monitored closely for ongoing psychosis and paranoid delusions.  Given patient's confusion and chronic paranoia, no family support, APS report has been made and it got screened in for further evaluation by APS.   Differential diagnosis include: unspecified psychosis, delusion disorder, dementia, schizophrenia/schizoaffective disorder   Treatment Plan Summary: Requested the social work team to reach out to APS for safety concerns in the community as patient has chronic paranoia and confusion and is currently homeless patient remains confused and paranoid and spite of being compliant with medications.  Patient displays cognitive delay and deficits in executive function like planning, organizing and processing information.  At this point patient lacks capacity to make medical decisions.  We recommend APS referral for possible legal  guardianship as he has no family or legal next of kin to help with decision-making.   Safety and Monitoring:             -- InVoluntary admission to inpatient psychiatric unit for safety, stabilization and treatment             -- Daily contact with patient to assess and evaluate symptoms and progress in treatment             -- Patient's case to be discussed in multi-disciplinary team meeting             -- Observation Level: q15 minute checks             -- Vital signs:  q12 hours             -- Precautions: suicide, elopement, and assault   2. Psychiatric Diagnoses and Treatment:              Continue Prolixin  to 10 mg twice daily. discontinued Zyprexa .  -- The risks/benefits/side-effects/alternatives to this medication were discussed in detail with the patient and time was given for questions. The patient consents to medication trial.                -- Metabolic profile and EKG monitoring obtained while on an atypical antipsychotic (BMI: Lipid Panel: HbgA1c: QTc:)              -- Encouraged patient to participate in unit milieu and in scheduled group therapies                            3. Medical Issues Being Addressed:    No urgent medical needs noted  4. Discharge Planning:   -- Social work and case management to assist with discharge planning and identification of hospital follow-up needs prior to discharge  -- Estimated LOS: 3-4 days  Allyn Foil, MD 06/12/2024, 12:03 AM

## 2024-06-12 NOTE — Group Note (Signed)
 Date:  06/12/2024 Time:  11:11 PM  Group Topic/Focus:  Wrap-Up Group:   The focus of this group is to help patients review their daily goal of treatment and discuss progress on daily workbooks.    Participation Level:  Did Not Attend  Participation Quality:     Affect:     Cognitive:     Insight: None  Engagement in Group:     Modes of Intervention:     Additional Comments:    [name] 06/12/2024, 11:11 PM

## 2024-06-12 NOTE — Plan of Care (Signed)
  Problem: Coping: Goal: Level of anxiety will decrease Outcome: Progressing   Problem: Education: Goal: Emotional status will improve Outcome: Progressing   

## 2024-06-12 NOTE — Group Note (Signed)
 Date:  06/12/2024 Time:  3:49 PM  Group Topic/Focus:  Pet Therapy:  Rollo the Pet Therapist arrived on our unit and decided to visit our patients today!  Rollo benefits all the patients with stress reduction and overall better mental health status.    Participation Level:  Did Not Attend   Camellia HERO Rayanna Matusik 06/12/2024, 3:49 PM

## 2024-06-12 NOTE — Plan of Care (Signed)
  Problem: Education: Goal: Knowledge of General Education information will improve Description: Including pain rating scale, medication(s)/side effects and non-pharmacologic comfort measures Outcome: Progressing   Problem: Health Behavior/Discharge Planning: Goal: Ability to manage health-related needs will improve Outcome: Progressing   Problem: Clinical Measurements: Goal: Ability to maintain clinical measurements within normal limits will improve Outcome: Progressing   Problem: Nutrition: Goal: Adequate nutrition will be maintained Outcome: Progressing   Problem: Coping: Goal: Level of anxiety will decrease Outcome: Progressing   Problem: Elimination: Goal: Will not experience complications related to bowel motility Outcome: Progressing Goal: Will not experience complications related to urinary retention Outcome: Progressing

## 2024-06-12 NOTE — Group Note (Signed)
 Date:  06/12/2024 Time:  10:39 AM  Group Topic/Focus:  Early Warning Signs:   The focus of this group is to help patients identify signs or symptoms they exhibit before slipping into an unhealthy state or crisis.    Participation Level:  Did Not Attend   Camellia HERO Xyon Lukasik 06/12/2024, 10:39 AM

## 2024-06-12 NOTE — Progress Notes (Signed)
   06/11/24 2200  Psych Admission Type (Psych Patients Only)  Admission Status Voluntary  Psychosocial Assessment  Patient Complaints None  Eye Contact Fair  Facial Expression Animated  Affect Appropriate to circumstance  Speech Logical/coherent  Interaction Minimal  Motor Activity Slow  Appearance/Hygiene In scrubs  Behavior Characteristics Appropriate to situation  Mood Pleasant  Thought Process  Coherency WDL  Content WDL  Delusions None reported or observed  Perception WDL  Hallucination None reported or observed  Judgment Impaired  Confusion Mild  Danger to Self  Current suicidal ideation? Denies

## 2024-06-12 NOTE — Progress Notes (Signed)
   06/12/24 1000  Psych Admission Type (Psych Patients Only)  Admission Status Voluntary  Psychosocial Assessment  Patient Complaints Anxiety  Eye Contact Brief  Facial Expression Animated  Affect Preoccupied  Speech Logical/coherent  Interaction Minimal  Motor Activity Slow  Appearance/Hygiene In scrubs  Behavior Characteristics Appropriate to situation  Mood Pleasant  Thought Process  Coherency WDL  Content WDL  Delusions None reported or observed  Perception WDL  Hallucination None reported or observed  Judgment Impaired  Confusion Mild  Danger to Self  Current suicidal ideation? Denies  Description of Suicide Plan denies  Self-Injurious Behavior No self-injurious ideation or behavior indicators observed or expressed   Agreement Not to Harm Self Yes  Description of Agreement [place]  Danger to Others  Danger to Others None reported or observed

## 2024-06-13 DIAGNOSIS — F22 Delusional disorders: Secondary | ICD-10-CM | POA: Diagnosis not present

## 2024-06-13 DIAGNOSIS — F0390 Unspecified dementia without behavioral disturbance: Secondary | ICD-10-CM | POA: Diagnosis not present

## 2024-06-13 MED ORDER — STERILE WATER FOR INJECTION IJ SOLN
INTRAMUSCULAR | Status: AC
  Filled 2024-06-13: qty 10

## 2024-06-13 NOTE — Progress Notes (Signed)
 [facility] MD Progress Note  06/13/2024 11:04 AM Benjamin [name]  MRN:  [id number] Benjamin [name] is a 78 year old male who presents to the inpatient geriatric psych unit after jumping from a two story building. Patient was originally seen at [facility] who referred him to the ED who admitted him here on 03/27/2024. Patient reports that people from the [place] gang were trying to kill him and the only escape was through the bedroom window on the second floor. He currently lives in the house with [name] and his family and has been living with them for 3-4 years without any problems. On 03/19/2024 he went to the doctor and when he got back home the family wouldn't let him leave. He states that after he realized the gang was going to kill him he barricaded the door with his bed and jumped out the window. When he landed he ran away until one of his neighbors found him and called 911.   Subjective: Chart is reviewed and discussed with the treatment team.  APS is working on legal guardianship and placement.  He has guardianship hearing June 19, 2024  Patient is noted to be resting in bed.  He offers no complaints.  He is calm and cooperative.  Per nursing report patient needs redirection but is very cooperative with his ADLs.  He is taking his medications with no reported side effects.  He denies having any physical complaints.  He is not endorsing SI/HI/plan.  He is not responding to internal stimuli.  He denies auditory/visual hallucinations.   Sleep: Fair  Appetite:  Fair  Past Psychiatric History: see h&P  Family History:  Family History  Problem Relation Age of Onset   Colon cancer Neg Hx    Colon polyps Neg Hx    Stomach cancer Neg Hx    Esophageal cancer Neg Hx    Social History:  Social History   Substance and Sexual Activity  Alcohol Use No     Social History   Substance and Sexual Activity  Drug Use No    Social History   Socioeconomic History   Marital  status: Single    Spouse name: Not on file   Number of children: Not on file   Years of education: Not on file   Highest education level: Not on file  Occupational History   Not on file  Tobacco Use   Smoking status: Former    Types: Cigarettes   Smokeless tobacco: Never  Vaping Use   Vaping status: Never Used  Substance and Sexual Activity   Alcohol use: No   Drug use: No   Sexual activity: Not on file  Other Topics Concern   Not on file  Social History Narrative   Not on file   Social Drivers of Health   Financial Resource Strain: Not on file  Food Insecurity: No Food Insecurity (03/27/2024)   Hunger Vital Sign    Worried About Running Out of Food in the Last Year: Never true    Ran Out of Food in the Last Year: Never true  Transportation Needs: No Transportation Needs (03/27/2024)   PRAPARE - Administrator, Civil Service (Medical): No    Lack of Transportation (Non-Medical): No  Physical Activity: Not on file  Stress: Not on file  Social Connections: Moderately Integrated (03/27/2024)   Social Connection and Isolation Panel    Frequency of Communication with Friends and Family: Twice a week    Frequency of Social Gatherings  with Friends and Family: Once a week    Attends Religious Services: 1 to 4 times per year    Active Member of Clubs or Organizations: No    Attends Engineer, structural: 1 to 4 times per year    Marital Status: Divorced   Past Medical History:  Past Medical History:  Diagnosis Date   Allergy    Arthritis    back   Prostate cancer (HCC) dx'd 2010   surg only    Past Surgical History:  Procedure Laterality Date   COLONOSCOPY     EYE SURGERY     PROSTATE SURGERY      Current Medications: Current Facility-Administered Medications  Medication Dose Route Frequency Provider Last Rate Last Admin   acetaminophen  (TYLENOL ) tablet 650 mg  650 mg Oral Q6H PRN Coleman, Carolyn H, NP       alum & mag hydroxide-simeth  (MAALOX/MYLANTA) 200-200-20 MG/5ML suspension 30 mL  30 mL Oral Q4H PRN Coleman, Carolyn H, NP       docusate sodium  (COLACE) capsule 100 mg  100 mg Oral Daily Nyomie Ehrlich, MD   100 mg at 06/13/24 9090   fluPHENAZine  (PROLIXIN ) tablet 10 mg  10 mg Oral BID Shrivastava, Aryendra, MD   10 mg at 06/13/24 9191   Or   fluPHENAZine  (PROLIXIN ) injection 5 mg  5 mg Intramuscular BID Shrivastava, Aryendra, MD       Influenza vac split trivalent PF (FLUZONE HIGH-DOSE) injection 0.5 mL  0.5 mL Intramuscular Tomorrow-1000 Madaram, Kondal R, MD       magnesium  hydroxide (MILK OF MAGNESIA) suspension 30 mL  30 mL Oral Daily PRN Mardy Elveria DEL, NP       OLANZapine  (ZYPREXA ) injection 5 mg  5 mg Intramuscular TID PRN Coleman, Carolyn H, NP   5 mg at 04/12/24 2230   OLANZapine  zydis (ZYPREXA ) disintegrating tablet 5 mg  5 mg Oral TID PRN Coleman, Carolyn H, NP       polyethylene glycol (MIRALAX  / GLYCOLAX ) packet 17 g  17 g Oral Daily PRN Donnelly Mellow, MD   17 g at 06/04/24 2123   sterile water (preservative free) injection             Lab Results:  No results found for this or any previous visit (from the past 48 hours).    Blood Alcohol level:  Lab Results  Component Value Date   [name] <15 03/26/2024    Metabolic Disorder Labs: Lab Results  Component Value Date   HGBA1C 5.6 04/02/2024   MPG 114 04/02/2024   No results found for: PROLACTIN Lab Results  Component Value Date   CHOL 146 04/02/2024   TRIG 37 04/02/2024   HDL 43 04/02/2024   CHOLHDL 3.4 04/02/2024   VLDL 7 04/02/2024   LDLCALC 96 04/02/2024    Physical Findings: AIMS:  , ,  ,  ,    CIWA:    COWS:      Psychiatric Specialty Exam:  Presentation  General Appearance:  Appropriate for Environment; Bizarre  Eye Contact: Fair  Speech: Clear and Coherent  Speech Volume: Normal    Mood and Affect  Mood: Fine Affect: Congruent   Thought Process  Thought Processes: Disorganized  Descriptions of  Associations:Intact  Orientation:Full (Time, Place and Person)  Thought Content: Paranoia at baseline Hallucinations: Denies  Ideas of Reference: Paranoia at baseline Suicidal Thoughts: Denies  Homicidal Thoughts: Denies   Sensorium  Memory: impiared  Judgment: Impaired  Insight: Shallow  Executive Functions  Concentration: Fair  Attention Span: Fair  Recall: [employer] of Knowledge: Fair  Language: Fair   Psychomotor Activity  Psychomotor Activity: No data recorded  Musculoskeletal: Strength & Muscle Tone: within normal limits Gait & Station: normal Assets  Assets: Manufacturing systems engineer; Resilience    Physical Exam: Physical Exam Vitals and nursing note reviewed.    ROS Blood pressure 104/74, pulse 68, temperature 98.1 F (36.7 C), resp. rate 19, height 6' (1.829 m), weight 100 kg, SpO2 99%. Body mass index is 29.91 kg/m.  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  Patient needs to be monitored closely for ongoing psychosis and paranoid delusions.  Given patient's confusion and chronic paranoia, no family support, APS report has been made and it got screened in for further evaluation by APS.   Differential diagnosis include: unspecified psychosis, delusion disorder, dementia, schizophrenia/schizoaffective disorder   Treatment Plan Summary: Requested the social work team to reach out to APS for safety concerns in the community as patient has chronic paranoia and confusion and is currently homeless patient remains confused and paranoid and spite of being compliant with medications.  Patient displays cognitive delay and deficits in executive function like planning, organizing and processing  information.  At this point patient lacks capacity to make medical decisions.  We recommend APS referral for possible legal guardianship as he has no family or legal next of kin to help with decision-making.   Safety and Monitoring:             -- InVoluntary admission to inpatient psychiatric unit for safety, stabilization and treatment             -- Daily contact with patient to assess and evaluate symptoms and progress in treatment             -- Patient's case to be discussed in multi-disciplinary team meeting             -- Observation Level: q15 minute checks             -- Vital signs:  q12 hours             -- Precautions: suicide, elopement, and assault   2. Psychiatric Diagnoses and Treatment:              Continue Prolixin  to 10 mg twice daily. discontinued Zyprexa .  -- The risks/benefits/side-effects/alternatives to this medication were discussed in detail with the patient and time was given for questions. The patient consents to medication trial.                -- Metabolic profile and EKG monitoring obtained while on an atypical antipsychotic (BMI: Lipid Panel: HbgA1c: QTc:)              -- Encouraged patient to participate in unit milieu and in scheduled group therapies                            3. Medical Issues Being Addressed:    No urgent medical needs noted  4. Discharge Planning:   -- Social work and case management to assist with discharge planning and identification of hospital follow-up needs prior to discharge  -- Estimated  LOS: 3-4 days  Allyn Foil, MD 06/13/2024, 11:04 AM

## 2024-06-13 NOTE — Group Note (Signed)
 Date:  06/13/2024 Time:  8:40 PM  Group Topic/Focus:  Goals Group:   The focus of this group is to help patients establish daily goals to achieve during treatment and discuss how the patient can incorporate goal setting into their daily lives to aide in recovery.     Participation Level:  Active  Participation Quality:  Appropriate  Affect:  Appropriate  Cognitive:  Alert  Insight: Appropriate  Engagement in Group:  Engaged  Modes of Intervention:  Activity and Discussion  Additional Comments:    Sherrilyn JAYSON Redman 06/13/2024, 8:40 PM

## 2024-06-13 NOTE — Group Note (Signed)
 Date:  06/13/2024 Time:  4:26 PM  Group Topic/Focus:  Goals Group:   The focus of this group is to help patients establish daily goals to achieve during treatment and discuss how the patient can incorporate goal setting into their daily lives to aide in recovery.    Participation Level:  Did Not Attend   Benjamin [name] 06/13/2024, 4:26 PM

## 2024-06-13 NOTE — Plan of Care (Signed)
  Problem: Nutrition: Goal: Adequate nutrition will be maintained Outcome: Progressing   Problem: Coping: Goal: Level of anxiety will decrease Outcome: Progressing   Problem: Education: Goal: Mental status will improve Outcome: Progressing

## 2024-06-13 NOTE — BHH Group Notes (Signed)
 Spirituality Group   Description: Participant directed exploration of values, beliefs and meaning   **Focus on Gratitude: Invite reflection on sources of gratitude (external/internal); goal to invite internal gratitude to foster 1) reconnection with life-giving activities 2) self-compassion.   Following a brief framework of chaplain's role and ground rules of group behavior, participants are invited to share concerns or questions that engage spiritual life. Emphasis placed on common themes and shared experiences and ways to make meaning and clarify living into one's values.   Theory/Process/Goal: Utilize the theoretical framework of group therapy established by Celena Kite, Relational Cultural Theory and Rogerian approaches to facilitate relational empathy and use of the "here and now" to foster reflection, self-awareness, and sharing.   Observations: Benjamin [name] was reserved but did participate in the group discussion. He shared his view of faith as part of what he offers and part of his gifts.  [name] L. [name] HERO.Div

## 2024-06-13 NOTE — Progress Notes (Signed)
   06/13/24 2000  Psych Admission Type (Psych Patients Only)  Admission Status Voluntary  Psychosocial Assessment  Patient Complaints Confusion  Eye Contact Brief  Facial Expression Sullen  Affect Flat  Speech Logical/coherent  Interaction Minimal  Motor Activity Slow  Appearance/Hygiene In scrubs  Behavior Characteristics Cooperative;Calm  Mood Pleasant  Thought Process  Coherency WDL  Content WDL  Delusions None reported or observed  Perception WDL  Hallucination None reported or observed  Judgment Limited  Confusion Mild  Danger to Self  Current suicidal ideation? Denies  Self-Injurious Behavior No self-injurious ideation or behavior indicators observed or expressed   Agreement Not to Harm Self Yes  Description of Agreement verbal  Danger to Others  Danger to Others None reported or observed   Mood/Behavior:  Pleasant and cooperative.  Mild confusion. Isolative.    Psych assessment: Denies SI/HI and AVH.     Interaction / Group attendance:  Isolative. Minimal interaction with peers and staff. Attended group.   Medication/ PRNs: Compliant with scheduled medications. No PRNs given.   Pain: Denies  Sleep hours: 12.5   15 min checks in place for safety.

## 2024-06-13 NOTE — BH IP Treatment Plan (Signed)
 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[facility])  Secondary Diagnoses: Principal Problem:   Delusional disorder (HCC)   Current Medications:  Current Facility-Administered Medications  Medication Dose Route Frequency Provider Last Rate Last Admin   acetaminophen  (TYLENOL ) tablet 650 mg  650 mg Oral Q6H PRN Coleman, Carolyn H, NP       alum & mag hydroxide-simeth (MAALOX/MYLANTA) 200-200-20 MG/5ML suspension 30 mL  30 mL Oral Q4H PRN Coleman, Carolyn H, NP       docusate sodium  (COLACE) capsule 100 mg  100 mg Oral Daily Jadapalle, Sree, MD   100 mg at 06/13/24 9090   fluPHENAZine  (PROLIXIN ) tablet 10 mg  10 mg Oral BID Shrivastava, Aryendra, MD   10 mg at 06/13/24 9191   Or   fluPHENAZine  (PROLIXIN ) injection 5 mg  5 mg Intramuscular BID Shrivastava, Aryendra, MD       Influenza vac split trivalent PF (FLUZONE HIGH-DOSE) injection 0.5 mL  0.5 mL Intramuscular Tomorrow-1000 Madaram, Kondal R, MD       magnesium  hydroxide (MILK OF MAGNESIA) suspension 30 mL  30 mL Oral Daily PRN Coleman, Carolyn H, NP       OLANZapine  (ZYPREXA ) injection 5 mg  5 mg Intramuscular TID PRN Mardy Elveria DEL, NP   5 mg at 04/12/24 2230   OLANZapine  zydis (ZYPREXA ) disintegrating tablet 5 mg  5 mg Oral TID PRN Coleman, Carolyn H, NP       polyethylene glycol (MIRALAX  / GLYCOLAX ) packet 17 g  17 g Oral Daily PRN Donnelly Mellow, MD   17 g at 06/04/24 2123   sterile water (preservative free) injection            PTA Medications: Medications Prior to Admission  Medication Sig Dispense Refill Last Dose/Taking   docusate sodium  (COLACE) 100 MG capsule Take 100 mg by mouth daily.      omega-3 acid ethyl esters (LOVAZA) 1 g capsule Take 1 g by mouth daily.       Patient Stressors: Traumatic event    Patient Strengths: Communication skills   Treatment Modalities: Medication Management,  Group therapy, Case management,  1 to 1 session with clinician, Psychoeducation, Recreational therapy.   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  Therapeutic Interventions: 1 to 1 sessions, Unit Group sessions and Medication administration.  Evaluation of Outcomes: Not Progressing  Physician Treatment Plan for Secondary Diagnosis: Principal Problem:   Delusional disorder (HCC)  Long Term Goal(s): Improvement in symptoms so as ready for discharge   Short Term Goals: Ability to identify changes in lifestyle to reduce recurrence of condition will improve Ability to verbalize feelings will improve Ability to disclose and discuss suicidal ideas Ability to demonstrate self-control will improve Ability to identify and develop effective coping behaviors will improve     Medication Management: Evaluate patient's response, side effects, and tolerance of medication regimen.  Therapeutic Interventions: 1 to 1 sessions, Unit Group sessions and Medication administration.  Evaluation of Outcomes: Not Progressing  RN Treatment Plan for Primary Diagnosis: Delusional disorder (HCC) Long Term Goal(s): Knowledge of disease and therapeutic regimen to maintain health will improve  Short Term Goals: Ability to remain free from injury will improve, Ability to verbalize frustration and anger appropriately will improve, Ability to demonstrate self-control, Ability to participate in  decision making will improve, Ability to verbalize feelings will improve, Ability to disclose and discuss suicidal ideas, Ability to identify and develop effective coping behaviors will improve, and Compliance with prescribed medications will improve  Medication Management: RN will administer medications as ordered by provider, will assess and evaluate patient's response and provide education to patient for prescribed medication. RN will report any adverse and/or side effects to prescribing provider.  Therapeutic Interventions: 1 on 1 counseling sessions, Psychoeducation, Medication administration, Evaluate responses to treatment, Monitor vital signs and CBGs as ordered, Perform/monitor CIWA, COWS, AIMS and Fall Risk screenings as ordered, Perform wound care treatments as ordered.  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  Short Term Goals: Engage patient in aftercare planning with referrals and resources, Increase social support, Increase ability to appropriately verbalize feelings, Increase emotional regulation, Facilitate acceptance of mental health diagnosis and concerns, Facilitate patient progression through stages of change regarding substance use diagnoses and concerns, Identify triggers associated with mental health/substance abuse issues, and Increase skills for wellness and recovery  Therapeutic Interventions: Assess for all discharge needs, 1 to 1 time with Social worker, Explore available resources and support systems, Assess for adequacy in community support network, Educate family and significant other(s) on suicide prevention, Complete Psychosocial Assessment, Interpersonal group therapy.  Evaluation of Outcomes: Not Progressing   Progress in  Treatment: Attending groups: Yes. and No. Participating in groups: Yes. and No. Taking medication as prescribed: Yes. Toleration medication: Yes. Family/Significant other contact made: Yes, individual(s) contacted:  Benjamin [name], borther, [phone]  Patient understands diagnosis: No. Discussing patient identified problems/goals with staff: Yes. Medical problems stabilized or resolved: Yes. Denies suicidal/homicidal ideation: Yes. Issues/concerns per patient self-inventory: No. Other: None   New problem(s) identified: No, Describe:  05/14/24 Update: None  05/24/24 Update: No changes at this time.  Update 05/29/2024:  No changes at this time.  Update 06/03/2024:  No changes at this time. Update 06/08/24: No changes at this time Update 06/13/24: No changes at this time      New Short Term/Long Term Goal(s): elimination of symptoms of psychosis, medication management for mood stabilization; elimination of SI thoughts; development of comprehensive mental wellness plan. Update 04/02/24: No changes at this time. Update 04/07/24: No changes at this time. Update 04/12/24: No changes at this time Update 04/18/24: No changes at this time  Update 04/23/24: No changes at this time. Update 04/28/2024: No changes at this time.  Update 05/04/2024: No changes at this time.  Update 05/09/2024:  No changes at this time. 05/14/24 Update: No changes at this time. 05/19/24 Update: No changes at this time. 05/24/24 Update: No changes at this time. Update 05/29/2024:  No changes at this time. Update 06/03/2024:  No changes at this time. Update 06/08/24: No changes at this time Update 06/13/24: No changes at this time    Patient Goals:  Get out of here, I want to get healed up so I can get out of here Update 04/02/24: No changes at this time. Update 04/07/24: No changes  at this time .Update 04/12/24: No changes at this time Update 04/18/24: No changes at this time  Update 04/23/24: No changes at this time. Update 04/28/2024: No  changes at this time. Update 05/04/2024: No changes at this time.   Update 05/29/2024:  No changes at this time.  Update 06/03/2024:  No changes at this time. Update 06/08/24: No changes at this time Update 06/13/24: No changes at this time    Discharge Plan or Barriers: CSW will assist with appropriate discharge planning  Update 04/02/24: No changes at this time. Update 04/07/24: No changes at this time.Update 04/12/24: No changes at this time Update 04/18/24: CSW submitted report to APS. Care team looking at guardianship for pt at this time  Update 04/23/24: No changes at this time. Update 04/28/2024: No changes at this time. Update 05/04/2024: No changes at this time.  Update 05/09/2024:  [facility] APS continues to search for placement. 05/14/24 Update: [facility] APS continues to look for placement. CSW to continue to assess. 05/19/24 Update: No changes at this time. 05/24/24 Update:DSS continues to look for placement at this time Update 05/29/2024:  Patient remains on the unit and safe at this time.  Case was accepted by APS [name] and they are looking for placement for the patient.   Update 06/03/2024:  No changes at this time. Update 06/08/24: No changes at this time Update 06/13/24: [facility] APS having difficulty connecting with pt's care coordinator at [facility] to assist with placement    Reason for Continuation of Hospitalization: Delusions  Medication stabilization   Estimated Length of Stay: 1 to 7 days Update 04/02/24: TBD. Update 04/07/24: TBD Update 04/12/24:TBD. Update 04/28/24:TBD Update 05/04/2024: TBD Update 05/09/2024:  TBD  05/14/24 Update: TBD. 05/19/24 Update: TBD. 05/24/24 Update: TBD  Update 05/29/2024:  TBD.  Update 06/03/2024:  TBD Update 06/08/24: TBD Update 06/13/24: TBD  Last 3 [place] Suicide Severity Risk Score: Flowsheet Row Admission (Current) from 03/27/2024 in [facility] ED from 03/26/2024 in [facility] Emergency Department at [facility] ED from 03/25/2024 in [facility]  C-SSRS RISK CATEGORY No Risk No Risk No Risk    Last [facility] 2/9 Scores:     No data to display          Scribe for Treatment Team: Lum JONETTA Raynaldo ISRAEL 06/13/2024 3:38 PM

## 2024-06-13 NOTE — BHH Counselor (Signed)
 CSW received missed call from [name], APS social worker.   She reports at this time she is still having difficulties with pt's care coordinator service through [facility] and has been unable to make contact with them regarding placement.   Arch also reports she has been unable to find placement for pt at this time but is still searching

## 2024-06-13 NOTE — Plan of Care (Signed)
  Problem: Education: Goal: Knowledge of General Education information will improve Description: Including pain rating scale, medication(s)/side effects and non-pharmacologic comfort measures Outcome: Progressing   Problem: Health Behavior/Discharge Planning: Goal: Ability to manage health-related needs will improve Outcome: Progressing   Problem: Clinical Measurements: Goal: Ability to maintain clinical measurements within normal limits will improve Outcome: Progressing Goal: Will remain free from infection Outcome: Progressing Goal: Diagnostic test results will improve Outcome: Progressing Goal: Respiratory complications will improve Outcome: Progressing Goal: Cardiovascular complication will be avoided Outcome: Progressing   Problem: Activity: Goal: Risk for activity intolerance will decrease Outcome: Progressing   Problem: Nutrition: Goal: Adequate nutrition will be maintained Outcome: Progressing   Problem: Coping: Goal: Level of anxiety will decrease Outcome: Progressing   Problem: Elimination: Goal: Will not experience complications related to bowel motility Outcome: Progressing Goal: Will not experience complications related to urinary retention Outcome: Progressing   Problem: Pain Managment: Goal: General experience of comfort will improve and/or be controlled Outcome: Progressing   Problem: Safety: Goal: Ability to remain free from injury will improve Outcome: Progressing   Problem: Skin Integrity: Goal: Risk for impaired skin integrity will decrease Outcome: Progressing   Problem: Education: Goal: Knowledge of [place] [facility] Education information/materials will improve Outcome: Progressing Goal: Emotional status will improve Outcome: Progressing Goal: Mental status will improve Outcome: Progressing Goal: Verbalization of understanding the information provided will improve Outcome: Progressing   Problem: Activity: Goal: Interest or  engagement in activities will improve Outcome: Progressing Goal: Sleeping patterns will improve Outcome: Progressing   Problem: Coping: Goal: Ability to verbalize frustrations and anger appropriately will improve Outcome: Progressing Goal: Ability to demonstrate self-control will improve Outcome: Progressing   Problem: Health Behavior/Discharge Planning: Goal: Identification of resources available to assist in meeting health care needs will improve Outcome: Progressing Goal: Compliance with treatment plan for underlying cause of condition will improve Outcome: Progressing   Problem: Physical Regulation: Goal: Ability to maintain clinical measurements within normal limits will improve Outcome: Progressing   Problem: Safety: Goal: Periods of time without injury will increase Outcome: Progressing

## 2024-06-13 NOTE — Plan of Care (Signed)
?  Problem: Education: ?Goal: Knowledge of  General Education information/materials will improve ?Outcome: Progressing ?Goal: Emotional status will improve ?Outcome: Progressing ?Goal: Mental status will improve ?Outcome: Progressing ?Goal: Verbalization of understanding the information provided will improve ?Outcome: Progressing ?  ?Problem: Activity: ?Goal: Interest or engagement in activities will improve ?Outcome: Progressing ?Goal: Sleeping patterns will improve ?Outcome: Progressing ?  ?Problem: Coping: ?Goal: Ability to verbalize frustrations and anger appropriately will improve ?Outcome: Progressing ?Goal: Ability to demonstrate self-control will improve ?Outcome: Progressing ?  ?Problem: Health Behavior/Discharge Planning: ?Goal: Identification of resources available to assist in meeting health care needs will improve ?Outcome: Progressing ?Goal: Compliance with treatment plan for underlying cause of condition will improve ?Outcome: Progressing ?  ?Problem: Safety: ?Goal: Periods of time without injury will increase ?Outcome: Progressing ?  ?

## 2024-06-13 NOTE — Group Note (Signed)
 Date:  06/13/2024 Time:  11:07 AM  Group Topic/Focus:  Making Healthy Choices:   The focus of this group is to help patients identify negative/unhealthy choices they were using prior to admission and identify positive/healthier coping strategies to replace them upon discharge.    Participation Level:  Active  Participation Quality:  Appropriate  Affect:  Appropriate  Cognitive:  Appropriate  Insight: Appropriate  Engagement in Group:  Engaged  Modes of Intervention:  Discussion    Arland Nutting 06/13/2024, 11:07 AM

## 2024-06-13 NOTE — Progress Notes (Signed)
   06/12/24 2200  Psych Admission Type (Psych Patients Only)  Admission Status Voluntary  Psychosocial Assessment  Patient Complaints Anxiety  Eye Contact Brief  Facial Expression Animated  Affect Preoccupied  Speech Logical/coherent  Interaction Minimal  Motor Activity Slow  Appearance/Hygiene In scrubs  Behavior Characteristics Appropriate to situation  Mood Pleasant  Thought Process  Coherency WDL  Content WDL  Delusions None reported or observed  Perception WDL  Hallucination None reported or observed  Judgment Impaired  Confusion Mild  Danger to Self  Current suicidal ideation? Denies

## 2024-06-13 NOTE — Progress Notes (Signed)
 Pt is seen in the dayroom eating all three meals. He denies SI/HI/AVH at this time. He is minimal in interaction and isolative to his bedroom only coming out for meals. He is safe on the unit at this time with Q15 min safety checks in place.   Pt had BM on 10/21.

## 2024-06-14 DIAGNOSIS — F0390 Unspecified dementia without behavioral disturbance: Secondary | ICD-10-CM | POA: Diagnosis not present

## 2024-06-14 DIAGNOSIS — F22 Delusional disorders: Secondary | ICD-10-CM | POA: Diagnosis not present

## 2024-06-14 NOTE — Group Note (Signed)
 Date:  06/14/2024 Time:  3:42 PM  Group Topic/Focus:  Overcoming Stress:   The focus of this group is to define stress and help patients assess their triggers.    Participation Level:  Did Not Attend   [name] 06/14/2024, 3:42 PM

## 2024-06-14 NOTE — Group Note (Signed)
 Date:  06/14/2024 Time:  11:07 AM  Group Topic/Focus:  Healthy Communication:   The focus of this group is to discuss communication, barriers to communication, as well as healthy ways to communicate with others.    Participation Level:  Did Not Attend   Benjamin [name] 06/14/2024, 11:07 AM

## 2024-06-14 NOTE — Progress Notes (Signed)
 SI/HI/AVH: denies all  Behavior/Mood: cooperative/pleasant    Interaction/Group attendance: minimal/0 of 3 groups    Medication/PRNs: compliant/no PRNs    Pain: denies  Other: received flu vaccine today

## 2024-06-14 NOTE — Group Note (Signed)
 Recreation Therapy Group Note   Group Topic:General Recreation  Group Date: 06/14/2024 Start Time: 1400 End Time: 1455 Facilitators: Celestia Jeoffrey BRAVO, LRT, CTRS Location: Courtyard  Group Description: Outdoor Recreation. Patients had the option to play corn hole, ring toss, UNO, or listening to music while outside in the courtyard getting fresh air and sunlight. Patients helped water and prune the raised garden beds. LRT and patients discussed things that they enjoy doing in their free time outside of the hospital. LRT encouraged patients to drink water after being active and getting their heart rate up.  Goal Area(s) Addressed: Patient will identify leisure interests.  Patient will practice healthy decision making. Patient will engage in recreation activity.   Affect/Mood: N/A   Participation Level: Did not attend    Clinical Observations/Individualized Feedback: Patient did not attend group.   Plan: Continue to engage patient in RT group sessions 2-3x/week.   Jeoffrey BRAVO Celestia, LRT, CTRS 06/14/2024 4:49 PM

## 2024-06-14 NOTE — Plan of Care (Signed)
   Problem: Clinical Measurements: Goal: Ability to maintain clinical measurements within normal limits will improve Outcome: Progressing Goal: Will remain free from infection Outcome: Progressing

## 2024-06-14 NOTE — Group Note (Signed)
 Date:  06/14/2024 Time:  10:05 PM  Group Topic/Focus:  Identifying Needs:   The focus of this group is to help patients identify their personal needs that have been historically problematic and identify healthy behaviors to address their needs.    Participation Level:  Minimal  Participation Quality:  Appropriate  Affect:  Appropriate  Cognitive:  Alert  Insight: Limited  Engagement in Group:  Engaged  Modes of Intervention:  Orientation  Additional Comments:    Benjamin [name] 06/14/2024, 10:05 PM

## 2024-06-14 NOTE — Progress Notes (Signed)
 Patient received flu vaccine in right deltoid without incident at 1403. Patient education provided before and after injection. Verbalized understanding. Vaccine information sheet left with patient.

## 2024-06-15 DIAGNOSIS — F22 Delusional disorders: Secondary | ICD-10-CM | POA: Diagnosis not present

## 2024-06-15 DIAGNOSIS — F0390 Unspecified dementia without behavioral disturbance: Secondary | ICD-10-CM | POA: Diagnosis not present

## 2024-06-15 NOTE — Progress Notes (Signed)
   06/14/24 2015  Psych Admission Type (Psych Patients Only)  Admission Status Voluntary  Psychosocial Assessment  Patient Complaints Loneliness  Eye Contact Brief  Facial Expression Sad  Affect Flat  Speech Logical/coherent  Interaction Minimal  Motor Activity Slow  Appearance/Hygiene In scrubs  Behavior Characteristics Cooperative  Mood Pleasant  Thought Process  Coherency WDL  Content WDL  Delusions None reported or observed  Perception WDL  Hallucination None reported or observed  Judgment Limited  Confusion Mild  Danger to Self  Current suicidal ideation? Denies  Self-Injurious Behavior No self-injurious ideation or behavior indicators observed or expressed   Agreement Not to Harm Self Yes  Description of Agreement verbal  Danger to Others  Danger to Others None reported or observed   Mood/Behavior:  Pleasant and cooperative.  Mild confusion. Isolative. Pt could be heard overnight speaking and laughing out loud.   Psych assessment: Denies SI/HI and AVH.     Interaction / Group attendance:  Isolative. Minimal interaction with peers and staff. Attended group.   Medication/ PRNs: Compliant with scheduled medications. No PRNs given.   Pain: Denies    15 min checks in place for safety.

## 2024-06-15 NOTE — Progress Notes (Signed)
 SI/HI/AVH: denies all   Behavior/Mood: cooperative/pleasant     Interaction/Group attendance: minimal/1 of 3 groups    Medication/PRNs: compliant/no PRNs    Pain: denies   Other: n/a

## 2024-06-15 NOTE — Plan of Care (Signed)
  Problem: Education: Goal: Knowledge of General Education information will improve Description: Including pain rating scale, medication(s)/side effects and non-pharmacologic comfort measures Outcome: Progressing   Problem: Health Behavior/Discharge Planning: Goal: Ability to manage health-related needs will improve Outcome: Progressing   Problem: Clinical Measurements: Goal: Ability to maintain clinical measurements within normal limits will improve Outcome: Progressing Goal: Will remain free from infection Outcome: Progressing Goal: Diagnostic test results will improve Outcome: Progressing Goal: Respiratory complications will improve Outcome: Progressing Goal: Cardiovascular complication will be avoided Outcome: Progressing   Problem: Activity: Goal: Risk for activity intolerance will decrease Outcome: Progressing   Problem: Nutrition: Goal: Adequate nutrition will be maintained Outcome: Progressing   Problem: Coping: Goal: Level of anxiety will decrease Outcome: Progressing   Problem: Elimination: Goal: Will not experience complications related to bowel motility Outcome: Progressing Goal: Will not experience complications related to urinary retention Outcome: Progressing   Problem: Pain Managment: Goal: General experience of comfort will improve and/or be controlled Outcome: Progressing   Problem: Safety: Goal: Ability to remain free from injury will improve Outcome: Progressing   Problem: Skin Integrity: Goal: Risk for impaired skin integrity will decrease Outcome: Progressing   Problem: Education: Goal: Knowledge of [place] [facility] Education information/materials will improve Outcome: Progressing Goal: Emotional status will improve Outcome: Progressing Goal: Mental status will improve Outcome: Progressing Goal: Verbalization of understanding the information provided will improve Outcome: Progressing   Problem: Activity: Goal: Interest or  engagement in activities will improve Outcome: Progressing Goal: Sleeping patterns will improve Outcome: Progressing   Problem: Coping: Goal: Ability to verbalize frustrations and anger appropriately will improve Outcome: Progressing Goal: Ability to demonstrate self-control will improve Outcome: Progressing   Problem: Health Behavior/Discharge Planning: Goal: Identification of resources available to assist in meeting health care needs will improve Outcome: Progressing Goal: Compliance with treatment plan for underlying cause of condition will improve Outcome: Progressing   Problem: Physical Regulation: Goal: Ability to maintain clinical measurements within normal limits will improve Outcome: Progressing   Problem: Safety: Goal: Periods of time without injury will increase Outcome: Progressing
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
## 2024-06-15 NOTE — Plan of Care (Signed)
   Problem: Coping: Goal: Level of anxiety will decrease Outcome: Progressing   Problem: Elimination: Goal: Will not experience complications related to bowel motility Outcome: Progressing

## 2024-06-15 NOTE — Group Note (Signed)
 Recreation Therapy Group Note   Group Topic:Stress Management  Group Date: 06/15/2024 Start Time: 1415 End Time: 1455 Facilitators: Celestia Jeoffrey BRAVO, LRT, CTRS Location: Courtyard  Group Description: Meditation. LRT and patients discussed what they know about meditation and mindfulness. LRT played a Deep Breathing Meditation exercise script for patients to follow along to. LRT and patients discussed how meditation and deep breathing can be used as a coping skill post--discharge to help manage symptoms of stress.   Goal Area(s) Addressed: Patient will practice using relaxation technique. Patient will identify a new coping skill.  Patient will follow multistep directions to reduce anxiety and stress.  Affect/Mood: N/A   Participation Level: Did not attend    Clinical Observations/Individualized Feedback: Patient did not attend group.   Plan: Continue to engage patient in RT group sessions 2-3x/week.   Jeoffrey BRAVO Celestia, LRT, CTRS 06/15/2024 4:31 PM

## 2024-06-15 NOTE — Group Note (Signed)
 Date:  06/15/2024 Time:  11:07 AM  Group Topic/Focus:  Self Care:   The focus of this group is to help patients understand the importance of self-care in order to improve or restore emotional, physical, spiritual, interpersonal, and financial health.    Participation Level:  Did Not Attend   Benjamin [name] 06/15/2024, 11:07 AM

## 2024-06-15 NOTE — Progress Notes (Signed)
 [facility] MD Progress Note  06/15/2024 3:39 PM Derryl Uher  MRN:  [id number] Dredyn Gubbels is a 78 year old male who presents to the inpatient geriatric psych unit after jumping from a two story building. Patient was originally seen at [name] who referred him to the ED who admitted him here on 03/27/2024. Patient reports that people from the [place] gang were trying to kill him and the only escape was through the bedroom window on the second floor. He currently lives in the house with [name] and his family and has been living with them for 3-4 years without any problems. On 03/19/2024 he went to the doctor and when he got back home the family wouldn't let him leave. He states that after he realized the gang was going to kill him he barricaded the door with his bed and jumped out the window. When he landed he ran away until one of his neighbors found him and called 911.   Subjective: Chart is reviewed and discussed with the treatment team.  APS is working on legal guardianship and placement.  He has guardianship hearing June 19, 2024  Today on interview patient is noted to be sleeping in his room.  He did acknowledge having his breakfast but is unable to remember what he had his for breakfast.  He denies having any physical complaints.  He reports fair appetite and sleep.  He does report intermittently feeling heavy and eating too much food.  Provider encouraged him to walk in the hallway.  He denies auditory/visual hallucinations and denies SI/HI/plan.  Per nursing he is taking his medications with no reported side effects.   Sleep: Fair  Appetite:  Fair  Past Psychiatric History: see h&P  Family History:  Family History  Problem Relation Age of Onset   Colon cancer Neg Hx    Colon polyps Neg Hx    Stomach cancer Neg Hx    Esophageal cancer Neg Hx    Social History:  Social History   Substance and Sexual Activity  Alcohol Use No     Social History   Substance  and Sexual Activity  Drug Use No    Social History   Socioeconomic History   Marital status: Single    Spouse name: Not on file   Number of children: Not on file   Years of education: Not on file   Highest education level: Not on file  Occupational History   Not on file  Tobacco Use   Smoking status: Former    Types: Cigarettes   Smokeless tobacco: Never  Vaping Use   Vaping status: Never Used  Substance and Sexual Activity   Alcohol use: No   Drug use: No   Sexual activity: Not on file  Other Topics Concern   Not on file  Social History Narrative   Not on file   Social Drivers of Health   Financial Resource Strain: Not on file  Food Insecurity: No Food Insecurity (03/27/2024)   Hunger Vital Sign    Worried About Running Out of Food in the Last Year: Never true    Ran Out of Food in the Last Year: Never true  Transportation Needs: No Transportation Needs (03/27/2024)   PRAPARE - Administrator, Civil Service (Medical): No    Lack of Transportation (Non-Medical): No  Physical Activity: Not on file  Stress: Not on file  Social Connections: Moderately Integrated (03/27/2024)   Social Connection and Isolation Panel    Frequency  of Communication with Friends and Family: Twice a week    Frequency of Social Gatherings with Friends and Family: Once a week    Attends Religious Services: 1 to 4 times per year    Active Member of [employer] or Organizations: No    Attends Engineer, structural: 1 to 4 times per year    Marital Status: Divorced   Past Medical History:  Past Medical History:  Diagnosis Date   Allergy    Arthritis    back   Prostate cancer (HCC) dx'd 2010   surg only    Past Surgical History:  Procedure Laterality Date   COLONOSCOPY     EYE SURGERY     PROSTATE SURGERY      Current Medications: Current Facility-Administered Medications  Medication Dose Route Frequency Provider Last Rate Last Admin   acetaminophen  (TYLENOL ) tablet 650 mg   650 mg Oral Q6H PRN Coleman, Carolyn H, NP       alum & mag hydroxide-simeth (MAALOX/MYLANTA) 200-200-20 MG/5ML suspension 30 mL  30 mL Oral Q4H PRN Coleman, Carolyn H, NP       docusate sodium  (COLACE) capsule 100 mg  100 mg Oral Daily Jemma Rasp, MD   100 mg at 06/15/24 9087   fluPHENAZine  (PROLIXIN ) tablet 10 mg  10 mg Oral BID Shrivastava, Aryendra, MD   10 mg at 06/15/24 9087   Or   fluPHENAZine  (PROLIXIN ) injection 5 mg  5 mg Intramuscular BID Shrivastava, Aryendra, MD       magnesium  hydroxide (MILK OF MAGNESIA) suspension 30 mL  30 mL Oral Daily PRN Mardy Elveria DEL, NP       OLANZapine  (ZYPREXA ) injection 5 mg  5 mg Intramuscular TID PRN Mardy Elveria DEL, NP   5 mg at 04/12/24 2230   OLANZapine  zydis (ZYPREXA ) disintegrating tablet 5 mg  5 mg Oral TID PRN Mardy Elveria DEL, NP       polyethylene glycol (MIRALAX  / GLYCOLAX ) packet 17 g  17 g Oral Daily PRN Donnelly Mellow, MD   17 g at 06/04/24 2123    Lab Results:  No results found for this or any previous visit (from the past 48 hours).    Blood Alcohol level:  Lab Results  Component Value Date   [facility] <15 03/26/2024    Metabolic Disorder Labs: Lab Results  Component Value Date   HGBA1C 5.6 04/02/2024   MPG 114 04/02/2024   No results found for: PROLACTIN Lab Results  Component Value Date   CHOL 146 04/02/2024   TRIG 37 04/02/2024   HDL 43 04/02/2024   CHOLHDL 3.4 04/02/2024   VLDL 7 04/02/2024   LDLCALC 96 04/02/2024    Physical Findings: AIMS:  , ,  ,  ,    CIWA:    COWS:      Psychiatric Specialty Exam:  Presentation  General Appearance:  Appropriate for Environment; Bizarre  Eye Contact: Fair  Speech: Clear and Coherent  Speech Volume: Normal    Mood and Affect  Mood: Fine Affect: Congruent   Thought Process  Thought Processes: Disorganized  Descriptions of Associations:Intact  Orientation:Full (Time, Place and Person)  Thought Content: Paranoia at  baseline Hallucinations: Denies  Ideas of Reference: Paranoia at baseline Suicidal Thoughts: Denies  Homicidal Thoughts: Denies   Sensorium  Memory: impiared  Judgment: Impaired  Insight: Shallow   Executive Functions  Concentration: Fair  Attention Span: Fair  Recall: [employer] of Knowledge: Fair  Language: Fair   Psychomotor  Activity  Psychomotor Activity: No data recorded  Musculoskeletal: Strength & Muscle Tone: within normal limits Gait & Station: normal Assets  Assets: Manufacturing systems engineer; Resilience    Physical Exam: Physical Exam Vitals and nursing note reviewed.    ROS Blood pressure 132/77, pulse 63, temperature 98.1 F (36.7 C), resp. rate 18, height 6' (1.829 m), weight 100 kg, SpO2 100%. Body mass index is 29.91 kg/m.  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  Patient needs to be monitored closely for ongoing psychosis and paranoid delusions.  Given patient's confusion and chronic paranoia, no family support, APS report has been made and it got screened in for further evaluation by APS.   Differential diagnosis include: unspecified psychosis, delusion disorder, dementia, schizophrenia/schizoaffective disorder   Treatment Plan Summary: Requested the social work team to reach out to APS for safety concerns in the community as patient has chronic paranoia and confusion and is currently homeless patient remains confused and paranoid and spite of being compliant with medications.  Patient displays cognitive delay and deficits in executive function like planning, organizing and processing information.  At this point patient lacks capacity to make medical decisions.  We recommend APS  referral for possible legal guardianship as he has no family or legal next of kin to help with decision-making.   Safety and Monitoring:             -- InVoluntary admission to inpatient psychiatric unit for safety, stabilization and treatment             -- Daily contact with patient to assess and evaluate symptoms and progress in treatment             -- Patient's case to be discussed in multi-disciplinary team meeting             -- Observation Level: q15 minute checks             -- Vital signs:  q12 hours             -- Precautions: suicide, elopement, and assault   2. Psychiatric Diagnoses and Treatment:              Continue Prolixin  to 10 mg twice daily. discontinued Zyprexa .  -- The risks/benefits/side-effects/alternatives to this medication were discussed in detail with the patient and time was given for questions. The patient consents to medication trial.                -- Metabolic profile and EKG monitoring obtained while on an atypical antipsychotic (BMI: Lipid Panel: HbgA1c: QTc:)              -- Encouraged patient to participate in unit milieu and in scheduled group therapies                            3. Medical Issues Being Addressed:    No urgent medical needs noted  4. Discharge Planning:   -- Social work and case management to assist with discharge planning and identification of hospital follow-up needs prior to discharge  -- Estimated LOS: 3-4 days  Allyn Foil, MD 06/15/2024, 3:39 PM

## 2024-06-15 NOTE — Group Note (Signed)
 Physical/Occupational Therapy Group Note  Group Topic: Estate manager/land agent, Adaptive Equipment for ADLs  Group Date: 06/15/2024 Start Time: 1310 End Time: 1345 Facilitators: Lavonda Therisa CROME, OT   Group Description: Group educated on safety considerations/modifications with bathing, dressing and ADL routines to maximize independence and minimize fall risk with tasks.  Reviewed and demonstrated use of shower chair and tub transfer bench as appropriate.  Reviewed and demonstrated use of adaptive equipment (sock aide, reacher, long-handled sponge) available for ADL tasks.  Reviewed and demonstrated role of activity pacing and energy conservation with functional activities.  Provided handout with visual reference of available equipment.  Therapeutic Goal(s):  Verbalize and demonstrate safe technique for tub/shower transfers with DME/adaptive equipment as needed. Verbalize and demonstrate appropriate use of adaptive equipment with bathing, dressing and ADL routine. Verbalize and demonstrate appropriate use of activity pacing and energy conservation with bathing, dressing and ADL routine.  Individual Participation: Pt attended group, minimally participated but did interact when prompted and asked 1-2 questions with prompting   Participation Level: Minimal   Participation Quality: Moderate Cues   Behavior: Alert and Disinterested   Speech/Thought Process: Directed   Affect/Mood: Flat   Insight: Lacking   Judgement: Lacking   Modes of Intervention: Activity, Discussion, Education, and Problem-solving  Patient Response to Interventions:  Disengaged   Plan: Continue to engage patient in PT/OT groups 1 - 2x/week.  06/15/2024  Therisa CROME Lavonda, OT Therisa Lavonda, OTD OTR/L  06/15/24, 3:41 PM

## 2024-06-16 DIAGNOSIS — F0390 Unspecified dementia without behavioral disturbance: Secondary | ICD-10-CM | POA: Diagnosis not present

## 2024-06-16 DIAGNOSIS — F22 Delusional disorders: Secondary | ICD-10-CM | POA: Diagnosis not present

## 2024-06-16 NOTE — Group Note (Signed)
 Date:  06/16/2024 Time:  5:33 PM  Group Topic/Focus:  Healthy Communication:   The focus of this group is to discuss communication, barriers to communication, as well as healthy ways to communicate with others.    Participation Level:  Did Not Attend   Benjamin [name] 06/16/2024, 5:33 PM

## 2024-06-16 NOTE — Plan of Care (Signed)
   Problem: Physical Regulation: Goal: Ability to maintain clinical measurements within normal limits will improve Outcome: Progressing   Problem: Safety: Goal: Periods of time without injury will increase Outcome: Progressing

## 2024-06-16 NOTE — Progress Notes (Signed)
 [facility] MD Progress Note  06/16/2024 1:41 PM Benjamin [name]  MRN:  [id number] Benjamin [name] is a 78 year old male who presents to the inpatient geriatric psych unit after jumping from a two story building. Patient was originally seen at [facility] who referred him to the ED who admitted him here on 03/27/2024. Patient reports that people from the [place] gang were trying to kill him and the only escape was through the bedroom window on the second floor. He currently lives in the house with [name] and his family and has been living with them for 3-4 years without any problems. On 03/19/2024 he went to the doctor and when he got back home the family wouldn't let him leave. He states that after he realized the gang was going to kill him he barricaded the door with his bed and jumped out the window. When he landed he ran away until one of his neighbors found him and called 911.   Subjective: Chart is reviewed and discussed with the treatment team.  APS is working on legal guardianship and placement.  He has guardianship hearing June 19, 2024  Patient is noted to be resting in bed.  He reports feeling sleepy and tired.  Patient denies SI/HI/plan.  Patient is not endorsing auditory/visual hallucinations.  Patient is taking his medications with no reported side effects.  Sleep: Fair  Appetite:  Fair  Past Psychiatric History: see h&P  Family History:  Family History  Problem Relation Age of Onset   Colon cancer Neg Hx    Colon polyps Neg Hx    Stomach cancer Neg Hx    Esophageal cancer Neg Hx    Social History:  Social History   Substance and Sexual Activity  Alcohol Use No     Social History   Substance and Sexual Activity  Drug Use No    Social History   Socioeconomic History   Marital status: Single    Spouse name: Not on file   Number of children: Not on file   Years of education: Not on file   Highest education level: Not on file  Occupational History    Not on file  Tobacco Use   Smoking status: Former    Types: Cigarettes   Smokeless tobacco: Never  Vaping Use   Vaping status: Never Used  Substance and Sexual Activity   Alcohol use: No   Drug use: No   Sexual activity: Not on file  Other Topics Concern   Not on file  Social History Narrative   Not on file   Social Drivers of Health   Financial Resource Strain: Not on file  Food Insecurity: No Food Insecurity (03/27/2024)   Hunger Vital Sign    Worried About Running Out of Food in the Last Year: Never true    Ran Out of Food in the Last Year: Never true  Transportation Needs: No Transportation Needs (03/27/2024)   PRAPARE - Administrator, Civil Service (Medical): No    Lack of Transportation (Non-Medical): No  Physical Activity: Not on file  Stress: Not on file  Social Connections: Moderately Integrated (03/27/2024)   Social Connection and Isolation Panel    Frequency of Communication with Friends and Family: Twice a week    Frequency of Social Gatherings with Friends and Family: Once a week    Attends Religious Services: 1 to 4 times per year    Active Member of [employer] or Organizations: No    Attends [employer]  or Organization Meetings: 1 to 4 times per year    Marital Status: Divorced   Past Medical History:  Past Medical History:  Diagnosis Date   Allergy    Arthritis    back   Prostate cancer (HCC) dx'd 2010   surg only    Past Surgical History:  Procedure Laterality Date   COLONOSCOPY     EYE SURGERY     PROSTATE SURGERY      Current Medications: Current Facility-Administered Medications  Medication Dose Route Frequency Provider Last Rate Last Admin   acetaminophen  (TYLENOL ) tablet 650 mg  650 mg Oral Q6H PRN Coleman, Carolyn H, NP       alum & mag hydroxide-simeth (MAALOX/MYLANTA) 200-200-20 MG/5ML suspension 30 mL  30 mL Oral Q4H PRN Coleman, Carolyn H, NP       docusate sodium  (COLACE) capsule 100 mg  100 mg Oral Daily Kowen Kluth, MD   100 mg at  06/16/24 9072   fluPHENAZine  (PROLIXIN ) tablet 10 mg  10 mg Oral BID Shrivastava, Aryendra, MD   10 mg at 06/16/24 9072   Or   fluPHENAZine  (PROLIXIN ) injection 5 mg  5 mg Intramuscular BID Shrivastava, Aryendra, MD       magnesium  hydroxide (MILK OF MAGNESIA) suspension 30 mL  30 mL Oral Daily PRN Mardy Elveria DEL, NP       OLANZapine  (ZYPREXA ) injection 5 mg  5 mg Intramuscular TID PRN Mardy Elveria DEL, NP   5 mg at 04/12/24 2230   OLANZapine  zydis (ZYPREXA ) disintegrating tablet 5 mg  5 mg Oral TID PRN Coleman, Carolyn H, NP       polyethylene glycol (MIRALAX  / GLYCOLAX ) packet 17 g  17 g Oral Daily PRN Donnelly Mellow, MD   17 g at 06/04/24 2123    Lab Results:  No results found for this or any previous visit (from the past 48 hours).    Blood Alcohol level:  Lab Results  Component Value Date   [name] <15 03/26/2024    Metabolic Disorder Labs: Lab Results  Component Value Date   HGBA1C 5.6 04/02/2024   MPG 114 04/02/2024   No results found for: PROLACTIN Lab Results  Component Value Date   CHOL 146 04/02/2024   TRIG 37 04/02/2024   HDL 43 04/02/2024   CHOLHDL 3.4 04/02/2024   VLDL 7 04/02/2024   LDLCALC 96 04/02/2024    Physical Findings: AIMS:  , ,  ,  ,    CIWA:    COWS:      Psychiatric Specialty Exam:  Presentation  General Appearance:  Appropriate for Environment; Bizarre  Eye Contact: Fair  Speech: Clear and Coherent  Speech Volume: Normal    Mood and Affect  Mood: Fine Affect: Congruent   Thought Process  Thought Processes: Disorganized  Descriptions of Associations:Intact  Orientation:Full (Time, Place and Person)  Thought Content: Paranoia at baseline Hallucinations: Denies  Ideas of Reference: Paranoia at baseline Suicidal Thoughts: Denies  Homicidal Thoughts: Denies   Sensorium  Memory: impiared  Judgment: Impaired  Insight: Shallow   Executive Functions  Concentration: Fair  Attention  Span: Fair  Recall: [employer] of Knowledge: Fair  Language: Fair   Psychomotor Activity  Psychomotor Activity: No data recorded  Musculoskeletal: Strength & Muscle Tone: within normal limits Gait & Station: normal Assets  Assets: Manufacturing Systems Engineer; Resilience    Physical Exam: Physical Exam Vitals and nursing note reviewed.    ROS Blood pressure 106/82, pulse 70, temperature 98.1 F (  36.7 C), resp. rate 18, height 6' (1.829 m), weight 100 kg, SpO2 100%. Body mass index is 29.91 kg/m.  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  Patient needs to be monitored closely for ongoing psychosis and paranoid delusions.  Given patient's confusion and chronic paranoia, no family support, APS report has been made and it got screened in for further evaluation by APS.   Differential diagnosis include: unspecified psychosis, delusion disorder, dementia, schizophrenia/schizoaffective disorder   Treatment Plan Summary: Requested the social work team to reach out to APS for safety concerns in the community as patient has chronic paranoia and confusion and is currently homeless patient remains confused and paranoid and spite of being compliant with medications.  Patient displays cognitive delay and deficits in executive function like planning, organizing and processing information.  At this point patient lacks capacity to make medical decisions.  We recommend APS referral for possible legal guardianship as he has no family or legal next of kin to help with decision-making.   Safety and Monitoring:             -- InVoluntary admission to inpatient psychiatric unit for safety, stabilization and treatment             -- Daily contact  with patient to assess and evaluate symptoms and progress in treatment             -- Patient's case to be discussed in multi-disciplinary team meeting             -- Observation Level: q15 minute checks             -- Vital signs:  q12 hours             -- Precautions: suicide, elopement, and assault   2. Psychiatric Diagnoses and Treatment:              Continue Prolixin  to 10 mg twice daily. discontinued Zyprexa .  -- The risks/benefits/side-effects/alternatives to this medication were discussed in detail with the patient and time was given for questions. The patient consents to medication trial.                -- Metabolic profile and EKG monitoring obtained while on an atypical antipsychotic (BMI: Lipid Panel: HbgA1c: QTc:)              -- Encouraged patient to participate in unit milieu and in scheduled group therapies                            3. Medical Issues Being Addressed:    No urgent medical needs noted  4. Discharge Planning:   -- Social work and case management to assist with discharge planning and identification of hospital follow-up needs prior to discharge  -- Estimated LOS: 3-4 days  Allyn Foil, MD 06/16/2024, 1:41 PM

## 2024-06-16 NOTE — Progress Notes (Signed)
   06/16/24 1300  Psych Admission Type (Psych Patients Only)  Admission Status Voluntary  Psychosocial Assessment  Patient Complaints Isolation  Eye Contact Brief  Facial Expression Sad  Affect Flat  Speech Logical/coherent  Interaction Minimal  Motor Activity Slow  Appearance/Hygiene In scrubs  Behavior Characteristics Appropriate to situation  Mood Pleasant  Thought Process  Coherency WDL  Content WDL  Delusions None reported or observed  Perception WDL  Hallucination None reported or observed  Judgment Limited  Confusion Mild  Danger to Self  Current suicidal ideation? Denies  Agreement Not to Harm Self Yes  Description of Agreement verbal  Danger to Others  Danger to Others None reported or observed

## 2024-06-16 NOTE — Plan of Care (Signed)
  Problem: Activity: Goal: Sleeping patterns will improve Outcome: Progressing   

## 2024-06-16 NOTE — Progress Notes (Signed)
   06/15/24 2120  Psych Admission Type (Psych Patients Only)  Admission Status Voluntary  Psychosocial Assessment  Patient Complaints Isolation  Eye Contact Brief  Facial Expression Sad  Affect Flat  Speech Logical/coherent  Interaction Minimal  Motor Activity Slow  Appearance/Hygiene In scrubs  Behavior Characteristics Appropriate to situation  Mood Pleasant  Thought Process  Coherency WDL  Content WDL  Delusions None reported or observed  Perception WDL  Hallucination None reported or observed  Judgment Limited  Confusion Mild  Danger to Self  Current suicidal ideation? Denies  Agreement Not to Harm Self Yes  Description of Agreement verbal  Danger to Others  Danger to Others None reported or observed

## 2024-06-16 NOTE — Group Note (Signed)
 Date:  06/16/2024 Time:  10:49 AM  Group Topic/Focus:  Goals/Overcoming Obstacles Group:   The focus of this group is to help patients establish daily goals to achieve during treatment and discuss how the patient can incorporate goal setting into their daily lives to aide in recovery. Patients also went over a overcoming obstacles worksheet and learned what the difference between each obstacle they are facing.     Participation Level:  Did Not Attend  Beatris ONEIDA Hasten 06/16/2024, 10:49 AM

## 2024-06-16 NOTE — Group Note (Unsigned)
 Date:  06/16/2024 Time:  8:55 PM  Group Topic/Focus:  Healthy Communication:   The focus of this group is to discuss communication, barriers to communication, as well as healthy ways to communicate with others.     Participation Level:  {BHH PARTICIPATION OZCZO:77735}  Participation Quality:  {BHH PARTICIPATION QUALITY:22265}  Affect:  {BHH AFFECT:22266}  Cognitive:  {BHH COGNITIVE:22267}  Insight: {BHH Insight2:20797}  Engagement in Group:  {BHH ENGAGEMENT IN HMNLE:77731}  Modes of Intervention:  {BHH MODES OF INTERVENTION:22269}  Additional Comments:  ***  Benjamin [name] 06/16/2024, 8:55 PM

## 2024-06-17 DIAGNOSIS — F22 Delusional disorders: Secondary | ICD-10-CM | POA: Diagnosis not present

## 2024-06-17 DIAGNOSIS — F0390 Unspecified dementia without behavioral disturbance: Secondary | ICD-10-CM | POA: Diagnosis not present

## 2024-06-17 NOTE — Group Note (Signed)
 Date:  06/17/2024 Time:  5:16 PM  Group Topic/Focus:    There is an intrinsic connection between music and nature, and when we tune in, we can find rhythms, melodies and inspiration all around us . Benjamin [name] has inspired cultures and musicians for centuries, shaping everything from traditional folk songs to ppl corporation. We present a view that places our ability to create and appreciate music at the center of what it means to be human. We argue that music is the sounds of human bodies, voices and minds - our personalities - moving in creative, story-making ways.  Participation Level:  Did Not Attend   Benjamin [name] Gavel 06/17/2024, 5:16 PM

## 2024-06-17 NOTE — Group Note (Signed)
 Date:  06/17/2024 Time:  10:32 AM  Group Topic/Focus:  Movement Therapy    Participation Level:  None  Participation Quality:  none  Affect:  Flat  Cognitive:  none  Insight: None  Engagement in Group:  None  Modes of Intervention:  Activity  Additional Comments:  Patient just sat in the dayroom.  [name] 06/17/2024, 10:32 AM

## 2024-06-17 NOTE — Progress Notes (Incomplete)
   06/17/24 2100  Psych Admission Type (Psych Patients Only)  Admission Status Voluntary  Psychosocial Assessment  Patient Complaints None  Eye Contact Brief  Facial Expression Flat  Affect Flat  Speech Logical/coherent  Interaction Minimal  Motor Activity Slow  Appearance/Hygiene In scrubs  Behavior Characteristics Appropriate to situation;Cooperative  Mood Pleasant  Danger to Self  Current suicidal ideation? Denies  Self-Injurious Behavior No self-injurious ideation or behavior indicators observed or expressed   Agreement Not to Harm Self Yes  Description of Agreement verbal  Danger to Others  Danger to Others None reported or observed

## 2024-06-17 NOTE — Group Note (Signed)
 LCSW Group Therapy Note  Group Date: 06/17/2024 Start Time: 1405 End Time: 1446   Type of Therapy and Topic:  Group Therapy - Healthy vs Unhealthy Coping Skills  Participation Level:  Did Not Attend   Description of Group The focus of this group was to determine what unhealthy coping techniques typically are used by group members and what healthy coping techniques would be helpful in coping with various problems. Patients were guided in becoming aware of the differences between healthy and unhealthy coping techniques. Patients were asked to identify 2-3 healthy coping skills they would like to learn to use more effectively.  Therapeutic Goals Patients learned that coping is what human beings do all day long to deal with various situations in their lives Patients defined and discussed healthy vs unhealthy coping techniques Patients identified their preferred coping techniques and identified whether these were healthy or unhealthy Patients determined 2-3 healthy coping skills they would like to become more familiar with and use more often. Patients provided support and ideas to each other   Summary of Patient Progress:  The patient did not attend group.   Therapeutic Modalities Cognitive Behavioral Therapy   Samia Kukla S Saverio Kader, LCSWA 06/17/2024  3:32 PM

## 2024-06-17 NOTE — Progress Notes (Signed)
   06/17/24 2100  Psych Admission Type (Psych Patients Only)  Admission Status Voluntary  Psychosocial Assessment  Patient Complaints None  Eye Contact Brief  Facial Expression Flat  Affect Flat  Speech Logical/coherent  Interaction Minimal  Motor Activity Slow  Appearance/Hygiene In scrubs  Behavior Characteristics Appropriate to situation;Cooperative  Mood Pleasant  Thought Process  Coherency WDL  Content WDL  Delusions None reported or observed  Perception WDL  Hallucination None reported or observed  Judgment Impaired  Confusion Mild  Danger to Self  Current suicidal ideation? Denies  Self-Injurious Behavior No self-injurious ideation or behavior indicators observed or expressed   Agreement Not to Harm Self Yes  Description of Agreement verbal  Danger to Others  Danger to Others None reported or observed   Mood/Behavior:  Pleasant and cooperative.  Mild confusion. Isolative. Pt could be heard overnight speaking and laughing out loud.   Psych assessment: Denies SI/HI and AVH.     Interaction / Group attendance:  Isolative. Minimal interaction with peers and staff. Attended group.   Medication/ PRNs: Compliant with scheduled medications. No PRNs given.   Pain: Denies     15 min checks in place for safety.
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
## 2024-06-17 NOTE — Group Note (Signed)
 Date:  06/17/2024 Time:  9:13 PM  Group Topic/Focus:  Wrap-Up Group:   The focus of this group is to help patients review their daily goal of treatment and discuss progress on daily workbooks.    Participation Level:  Active  Participation Quality:  Appropriate  Affect:  Appropriate  Cognitive:  Alert  Insight: Appropriate  Engagement in Group:  Engaged  Modes of Intervention:  Discussion  Additional Comments:    Benjamin [name] [name] 06/17/2024, 9:13 PM

## 2024-06-17 NOTE — Plan of Care (Signed)
 Benjamin [name] is a 78 y.o. male patient. No diagnosis found. Past Medical History:  Diagnosis Date   Allergy    Arthritis    back   Prostate cancer (HCC) dx'd 2010   surg only   Current Facility-Administered Medications  Medication Dose Route Frequency Provider Last Rate Last Admin   acetaminophen  (TYLENOL ) tablet 650 mg  650 mg Oral Q6H PRN Coleman, Carolyn H, NP       alum & mag hydroxide-simeth (MAALOX/MYLANTA) 200-200-20 MG/5ML suspension 30 mL  30 mL Oral Q4H PRN Coleman, Carolyn H, NP       docusate sodium  (COLACE) capsule 100 mg  100 mg Oral Daily Jadapalle, Sree, MD   100 mg at 06/16/24 9072   fluPHENAZine  (PROLIXIN ) tablet 10 mg  10 mg Oral BID Shrivastava, Aryendra, MD   10 mg at 06/16/24 2131   Or   fluPHENAZine  (PROLIXIN ) injection 5 mg  5 mg Intramuscular BID Shrivastava, Aryendra, MD       magnesium  hydroxide (MILK OF MAGNESIA) suspension 30 mL  30 mL Oral Daily PRN Coleman, Carolyn H, NP       OLANZapine  (ZYPREXA ) injection 5 mg  5 mg Intramuscular TID PRN Coleman, Carolyn H, NP   5 mg at 04/12/24 2230   OLANZapine  zydis (ZYPREXA ) disintegrating tablet 5 mg  5 mg Oral TID PRN Coleman, Carolyn H, NP       polyethylene glycol (MIRALAX  / GLYCOLAX ) packet 17 g  17 g Oral Daily PRN Donnelly Mellow, MD   17 g at 06/04/24 2123   No Known Allergies Principal Problem:   Delusional disorder (HCC)  Blood pressure 101/72, pulse 77, temperature (!) 97 F (36.1 C), resp. rate 18, height 6' (1.829 m), weight 100 kg, SpO2 100%.  Assessment & Plan: Patient was mostly in his room this shift. Patient denied SI/HI this shift. Patient did not endorse anxiety or depression this shift. Patient was med compliant and contracted for safety verbally.  Yolanda Huffstetler B Chris Narasimhan 06/17/2024

## 2024-06-17 NOTE — Plan of Care (Signed)
  Problem: Education: Goal: Knowledge of General Education information will improve Description: Including pain rating scale, medication(s)/side effects and non-pharmacologic comfort measures Outcome: Progressing   Problem: Health Behavior/Discharge Planning: Goal: Ability to manage health-related needs will improve Outcome: Progressing   Problem: Clinical Measurements: Goal: Ability to maintain clinical measurements within normal limits will improve Outcome: Progressing Goal: Will remain free from infection Outcome: Progressing Goal: Diagnostic test results will improve Outcome: Progressing Goal: Respiratory complications will improve Outcome: Progressing Goal: Cardiovascular complication will be avoided Outcome: Progressing   Problem: Activity: Goal: Risk for activity intolerance will decrease Outcome: Progressing   Problem: Nutrition: Goal: Adequate nutrition will be maintained Outcome: Progressing   Problem: Coping: Goal: Level of anxiety will decrease Outcome: Progressing   Problem: Elimination: Goal: Will not experience complications related to bowel motility Outcome: Progressing Goal: Will not experience complications related to urinary retention Outcome: Progressing   Problem: Pain Managment: Goal: General experience of comfort will improve and/or be controlled Outcome: Progressing   Problem: Safety: Goal: Ability to remain free from injury will improve Outcome: Progressing   Problem: Skin Integrity: Goal: Risk for impaired skin integrity will decrease Outcome: Progressing   Problem: Education: Goal: Knowledge of [place] [facility] Education information/materials will improve Outcome: Progressing Goal: Emotional status will improve Outcome: Progressing Goal: Mental status will improve Outcome: Progressing Goal: Verbalization of understanding the information provided will improve Outcome: Progressing   Problem: Activity: Goal: Interest or  engagement in activities will improve Outcome: Progressing Goal: Sleeping patterns will improve Outcome: Progressing   Problem: Coping: Goal: Ability to verbalize frustrations and anger appropriately will improve Outcome: Progressing Goal: Ability to demonstrate self-control will improve Outcome: Progressing   Problem: Health Behavior/Discharge Planning: Goal: Identification of resources available to assist in meeting health care needs will improve Outcome: Progressing Goal: Compliance with treatment plan for underlying cause of condition will improve Outcome: Progressing   Problem: Physical Regulation: Goal: Ability to maintain clinical measurements within normal limits will improve Outcome: Progressing   Problem: Safety: Goal: Periods of time without injury will increase Outcome: Progressing

## 2024-06-17 NOTE — Progress Notes (Addendum)
 Patient came out for breakfast.  Requested for and received clean scrubs.  Took all his medications without complaint.  Attended group.  Patient did not go out with peers for the outside group stating it was too cold.  Will continue to monitor.

## 2024-06-18 DIAGNOSIS — F0390 Unspecified dementia without behavioral disturbance: Secondary | ICD-10-CM | POA: Diagnosis not present

## 2024-06-18 DIAGNOSIS — F22 Delusional disorders: Secondary | ICD-10-CM | POA: Diagnosis not present

## 2024-06-18 NOTE — Group Note (Signed)
 Date:  06/18/2024 Time:  10:51 AM  Group Topic/Focus:  Goals Group:   The focus of this group is to help patients establish daily goals to achieve during treatment and discuss how the patient can incorporate goal setting into their daily lives to aide in recovery.    Participation Level:  Did Not Attend  Participation Quality:     Affect:     Cognitive:     Insight: None  Engagement in Group:    Modes of Intervention:     Additional Comments:    Shloimy Michalski K 06/18/2024, 10:51 AM

## 2024-06-18 NOTE — Group Note (Signed)
 Recreation Therapy Group Note   Group Topic:Coping Skills  Group Date: 06/18/2024 Start Time: 1500 End Time: 1540 Facilitators: Celestia Jeoffrey BRAVO, LRT, CTRS Location: Dayroom  Group Description: Seated Exercise. LRT discussed the mental and physical benefits of exercise. LRT and group discussed how physical activity can be used as a coping skill. Pt's and LRT followed along to an exercise video on the TV screen that provided a visual representation and audio description of every exercise performed. Pt's encouraged to listen to their bodies and stop at any time if they experience feelings of discomfort or pain. Pts were encouraged to drink water and stay hydrated.   Goal Area(s) Addressed:  Patient will learn benefits of physical activity. Patient will identify exercise as a coping skill.  Patient will follow multistep directions. Patient will try a new leisure interest.    Affect/Mood: N/A   Participation Level: Did not attend    Clinical Observations/Individualized Feedback: Patient did not attend group.   Plan: Continue to engage patient in RT group sessions 2-3x/week.   Jeoffrey BRAVO Celestia, LRT, CTRS 06/18/2024 4:35 PM

## 2024-06-18 NOTE — Progress Notes (Signed)
   06/18/24 2000  Psych Admission Type (Psych Patients Only)  Admission Status Voluntary  Psychosocial Assessment  Patient Complaints None  Eye Contact Brief  Facial Expression Flat  Affect Flat  Speech Logical/coherent  Interaction Assertive  Motor Activity Slow  Appearance/Hygiene In scrubs;Unremarkable  Behavior Characteristics Appropriate to situation;Cooperative  Mood Pleasant  Thought Process  Coherency WDL  Content WDL  Delusions None reported or observed  Perception WDL  Hallucination None reported or observed  Judgment Impaired  Confusion Mild  Danger to Self  Current suicidal ideation? Denies  Self-Injurious Behavior No self-injurious ideation or behavior indicators observed or expressed   Agreement Not to Harm Self Yes  Description of Agreement verbal  Danger to Others  Danger to Others None reported or observed   Mood/Behavior:  Pleasant and cooperative.    Psych assessment: Denies SI/HI and AVH.     Interaction / Group attendance:  Present in the milieu. Minimal interaction with peers and staff.  Attended group.   Medication/ PRNs: Compliant with scheduled medications. No prns required.   Pain: Denies   15 min checks in place for safety

## 2024-06-18 NOTE — Group Note (Signed)
 Date:  06/18/2024 Time:  5:23 PM  Group Topic/Focus:  Self Care:   The focus of this group is to help patients understand the importance of self-care in order to improve or restore emotional, physical, spiritual, interpersonal, and financial health.    Participation Level:  Active  Participation Quality:  Attentive  Affect:  Appropriate  Cognitive:  Appropriate  Insight: Improving  Engagement in Group:  Engaged  Modes of Intervention:  Discussion  Additional Comments:    [name] 06/18/2024, 5:23 PM

## 2024-06-18 NOTE — Group Note (Signed)
 Date:  06/18/2024 Time:  10:45 PM  Group Topic/Focus:  Wrap-Up Group:   The focus of this group is to help patients review their daily goal of treatment and discuss progress on daily workbooks.    Participation Level:  Did Not Attend  Participation Quality:     Affect:     Cognitive:     Insight: None  Engagement in Group:  None  Modes of Intervention:     Additional Comments:    [name] 06/18/2024, 10:45 PM

## 2024-06-18 NOTE — Progress Notes (Signed)
 Patient was admitted to [facility] in August for paranoia and psychosis.  Patient is much better and interacts well with both peers and staff.  Patient ambulates on his own and does his own ADL's. Takes his medications whole. Denies SI, HI, AVH, anxiety and depression.  Will continue to monitor.
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
## 2024-06-18 NOTE — BH IP Treatment Plan (Signed)
 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[facility])  Secondary Diagnoses: Principal Problem:   Delusional disorder (HCC)   Current Medications:  Current Facility-Administered Medications  Medication Dose Route Frequency Provider Last Rate Last Admin   acetaminophen  (TYLENOL ) tablet 650 mg  650 mg Oral Q6H PRN Coleman, Carolyn H, NP       alum & mag hydroxide-simeth (MAALOX/MYLANTA) 200-200-20 MG/5ML suspension 30 mL  30 mL Oral Q4H PRN Coleman, Carolyn H, NP       docusate sodium  (COLACE) capsule 100 mg  100 mg Oral Daily Jadapalle, Sree, MD   100 mg at 06/18/24 9153   fluPHENAZine  (PROLIXIN ) tablet 10 mg  10 mg Oral BID Shrivastava, Aryendra, MD   10 mg at 06/18/24 9272   Or   fluPHENAZine  (PROLIXIN ) injection 5 mg  5 mg Intramuscular BID Shrivastava, Aryendra, MD       magnesium  hydroxide (MILK OF MAGNESIA) suspension 30 mL  30 mL Oral Daily PRN Coleman, Carolyn H, NP       OLANZapine  (ZYPREXA ) injection 5 mg  5 mg Intramuscular TID PRN Coleman, Carolyn H, NP   5 mg at 04/12/24 2230   OLANZapine  zydis (ZYPREXA ) disintegrating tablet 5 mg  5 mg Oral TID PRN Coleman, Carolyn H, NP       polyethylene glycol (MIRALAX  / GLYCOLAX ) packet 17 g  17 g Oral Daily PRN Jadapalle, Sree, MD   17 g at 06/04/24 2123   PTA Medications: Medications Prior to Admission  Medication Sig Dispense Refill Last Dose/Taking   docusate sodium  (COLACE) 100 MG capsule Take 100 mg by mouth daily.      omega-3 acid ethyl esters (LOVAZA) 1 g capsule Take 1 g by mouth daily.       Patient Stressors: Traumatic event    Patient Strengths: Communication skills   Treatment Modalities: Medication Management, Group therapy, Case management,  1 to 1 session with clinician, Psychoeducation, Recreational therapy.   Physician Treatment Plan for Primary Diagnosis: Delusional disorder [facility]) Long  Term Goal(s): Improvement in symptoms so as ready for discharge   Short Term Goals: Ability to identify changes in lifestyle to reduce recurrence of condition will improve Ability to verbalize feelings will improve Ability to disclose and discuss suicidal ideas Ability to demonstrate self-control will improve Ability to identify and develop effective coping behaviors will improve  Medication Management: Evaluate patient's response, side effects, and tolerance of medication regimen.  Therapeutic Interventions: 1 to 1 sessions, Unit Group sessions and Medication administration.  Evaluation of Outcomes: Progressing  Physician Treatment Plan for Secondary Diagnosis: Principal Problem:   Delusional disorder (HCC)  Long Term Goal(s): Improvement in symptoms so as ready for discharge   Short Term Goals: Ability to identify changes in lifestyle to reduce recurrence of condition will improve Ability to verbalize feelings will improve Ability to disclose and discuss suicidal ideas Ability to demonstrate self-control will improve Ability to identify and develop effective coping behaviors will improve     Medication Management: Evaluate patient's response, side effects, and tolerance of medication regimen.  Therapeutic Interventions: 1 to 1 sessions, Unit Group sessions and Medication administration.  Evaluation of Outcomes: Progressing   RN Treatment Plan for Primary Diagnosis: Delusional disorder (HCC) Long Term Goal(s): Knowledge of disease and therapeutic regimen to maintain health will improve  Short Term Goals: Ability to remain free from injury will improve, Ability to verbalize frustration and anger appropriately  will improve, Ability to demonstrate self-control, Ability to participate in decision making will improve, Ability to verbalize feelings will improve, Ability to disclose and discuss suicidal ideas, Ability to identify and develop effective coping behaviors will improve, and  Compliance with prescribed medications will improve  Medication Management: RN will administer medications as ordered by provider, will assess and evaluate patient's response and provide education to patient for prescribed medication. RN will report any adverse and/or side effects to prescribing provider.  Therapeutic Interventions: 1 on 1 counseling sessions, Psychoeducation, Medication administration, Evaluate responses to treatment, Monitor vital signs and CBGs as ordered, Perform/monitor CIWA, COWS, AIMS and Fall Risk screenings as ordered, Perform wound care treatments as ordered.  Evaluation of Outcomes: Progressing   LCSW Treatment Plan for Primary Diagnosis: Delusional disorder [facility]) Long Term Goal(s): Safe transition to appropriate next level of care at discharge, Engage patient in therapeutic group addressing interpersonal concerns.  Short Term Goals: Engage patient in aftercare planning with referrals and resources, Increase social support, Increase ability to appropriately verbalize feelings, Increase emotional regulation, Facilitate acceptance of mental health diagnosis and concerns, Facilitate patient progression through stages of change regarding substance use diagnoses and concerns, Identify triggers associated with mental health/substance abuse issues, and Increase skills for wellness and recovery  Therapeutic Interventions: Assess for all discharge needs, 1 to 1 time with Social worker, Explore available resources and support systems, Assess for adequacy in community support network, Educate family and significant other(s) on suicide prevention, Complete Psychosocial Assessment, Interpersonal group therapy.  Evaluation of Outcomes: Progressing   Progress in Treatment: Attending groups: Yes. and No. Participating in groups: Yes. and No. Taking medication as prescribed: Yes. Toleration medication: Yes. Family/Significant other contact made: Yes, individual(s) contacted:   [name], borther, [phone]  Patient understands diagnosis: No. Discussing patient identified problems/goals with staff: Yes. Medical problems stabilized or resolved: Yes. Denies suicidal/homicidal ideation: Yes. Issues/concerns per patient self-inventory: No. Other: None   New problem(s) identified: No, Describe:  05/14/24 Update: None  05/24/24 Update: No changes at this time.  Update 05/29/2024:  No changes at this time.  Update 06/03/2024:  No changes at this time. Update 06/08/24: No changes at this time Update 06/13/24: No changes at this time  Update 06/18/24: No changes at this time      New Short Term/Long Term Goal(s): elimination of symptoms of psychosis, medication management for mood stabilization; elimination of SI thoughts; development of comprehensive mental wellness plan. Update 04/02/24: No changes at this time. Update 04/07/24: No changes at this time. Update 04/12/24: No changes at this time Update 04/18/24: No changes at this time  Update 04/23/24: No changes at this time. Update 04/28/2024: No changes at this time.  Update 05/04/2024: No changes at this time.  Update 05/09/2024:  No changes at this time. 05/14/24 Update: No changes at this time. 05/19/24 Update: No changes at this time. 05/24/24 Update: No changes at this time. Update 05/29/2024:  No changes at this time. Update 06/03/2024:  No changes at this time. Update 06/08/24: No changes at this time Update 06/13/24: No changes at this time Update 06/18/24: No changes at this time    Patient Goals:  Get out of here, I want to get healed up so I can get out of here Update 04/02/24: No changes at this time. Update 04/07/24: No changes at this time .Update 04/12/24: No changes at this time Update 04/18/24: No changes at this time  Update 04/23/24: No changes at this time. Update 04/28/2024: No changes  at this time. Update 05/04/2024: No changes at this time.   Update 05/29/2024:  No changes at this time.  Update 06/03/2024:  No changes  at this time. Update 06/08/24: No changes at this time Update 06/13/24: No changes at this time Update 06/18/24: No changes at this time    Discharge Plan or Barriers: CSW will assist with appropriate discharge planning  Update 04/02/24: No changes at this time. Update 04/07/24: No changes at this time.Update 04/12/24: No changes at this time Update 04/18/24: CSW submitted report to APS. Care team looking at guardianship for pt at this time  Update 04/23/24: No changes at this time. Update 04/28/2024: No changes at this time. Update 05/04/2024: No changes at this time.  Update 05/09/2024:  [name] APS continues to search for placement. 05/14/24 Update: [facility] APS continues to look for placement. CSW to continue to assess. 05/19/24 Update: No changes at this time. 05/24/24 Update:DSS continues to look for placement at this time Update 05/29/2024:  Patient remains on the unit and safe at this time.  Case was accepted by APS [facility] and they are looking for placement for the patient.   Update 06/03/2024:  No changes at this time. Update 06/08/24: No changes at this time Update 06/13/24: [facility] APS having difficulty connecting with pt's care coordinator at [name] to assist with placement Update 06/18/24: No changes at this time    Reason for Continuation of Hospitalization: Delusions  Medication stabilization   Estimated Length of Stay: 1 to 7 days Update 04/02/24: TBD. Update 04/07/24: TBD Update 04/12/24:TBD. Update 04/28/24:TBD Update 05/04/2024: TBD Update 05/09/2024:  TBD  05/14/24 Update: TBD. 05/19/24 Update: TBD. 05/24/24 Update: TBD  Update 05/29/2024:  TBD.  Update 06/03/2024:  TBD Update 06/08/24: TBD Update 06/13/24: TBD Update 06/18/24: TBD  Last 3 [place] Suicide Severity Risk Score: Flowsheet Row Admission (Current) from 03/27/2024 in [name] BE[facility]AL [facility] ED from 03/26/2024 in [facility] Department at [name] ED from 03/25/2024 in  [facility]  C-SSRS RISK CATEGORY No Risk No Risk No Risk    Last [facility] 2/9 Scores:     No data to display          Scribe for Treatment Team: [name], ISRA[place] 06/18/2024 4:04 PM

## 2024-06-18 NOTE — Plan of Care (Signed)
  Problem: Education: Goal: Knowledge of General Education information will improve Description: Including pain rating scale, medication(s)/side effects and non-pharmacologic comfort measures Outcome: Progressing   Problem: Health Behavior/Discharge Planning: Goal: Ability to manage health-related needs will improve Outcome: Progressing   Problem: Clinical Measurements: Goal: Ability to maintain clinical measurements within normal limits will improve Outcome: Progressing Goal: Will remain free from infection Outcome: Progressing Goal: Diagnostic test results will improve Outcome: Progressing Goal: Respiratory complications will improve Outcome: Progressing Goal: Cardiovascular complication will be avoided Outcome: Progressing   Problem: Activity: Goal: Risk for activity intolerance will decrease Outcome: Progressing   Problem: Nutrition: Goal: Adequate nutrition will be maintained Outcome: Progressing   Problem: Coping: Goal: Level of anxiety will decrease Outcome: Progressing   Problem: Elimination: Goal: Will not experience complications related to bowel motility Outcome: Progressing Goal: Will not experience complications related to urinary retention Outcome: Progressing   Problem: Pain Managment: Goal: General experience of comfort will improve and/or be controlled Outcome: Progressing   Problem: Safety: Goal: Ability to remain free from injury will improve Outcome: Progressing   Problem: Skin Integrity: Goal: Risk for impaired skin integrity will decrease Outcome: Progressing   Problem: Education: Goal: Knowledge of [place] [facility] Education information/materials will improve Outcome: Progressing Goal: Emotional status will improve Outcome: Progressing Goal: Mental status will improve Outcome: Progressing Goal: Verbalization of understanding the information provided will improve Outcome: Progressing   Problem: Activity: Goal: Interest or  engagement in activities will improve Outcome: Progressing Goal: Sleeping patterns will improve Outcome: Progressing   Problem: Coping: Goal: Ability to verbalize frustrations and anger appropriately will improve Outcome: Progressing Goal: Ability to demonstrate self-control will improve Outcome: Progressing   Problem: Health Behavior/Discharge Planning: Goal: Identification of resources available to assist in meeting health care needs will improve Outcome: Progressing Goal: Compliance with treatment plan for underlying cause of condition will improve Outcome: Progressing   Problem: Physical Regulation: Goal: Ability to maintain clinical measurements within normal limits will improve Outcome: Progressing   Problem: Safety: Goal: Periods of time without injury will increase Outcome: Progressing

## 2024-06-18 NOTE — Group Note (Signed)
 Physical/Occupational Therapy Group Note  Group Topic: Transfer Training   Group Date: 06/18/2024 Start Time: 1300 End Time: 1325 Facilitators: Aakash Hollomon, Alm Hamilton, PT   Group Description: Group educated on sequence and techniques to maximize safety with functional transfers.  Additionally, integrated education on impact of seating surfaces, use of assistive device and management of orthostasis with movement transitions.  Patients actively engaged with functional transfers (sit/stand) from various seating surfaces, with and without assist devices, working to integrate and retain education provided during session.  Allowed time for questions and further discussion on mobility concerns/needs.   Therapeutic Goal(s):  Identify and demonstrate safe technique for sit/stand transfers from various seating surfaces. Identify and demonstrate safe use of assistive devices with basic transfers and simple mobility. Identify and demonstrate ability to recognize signs/symptoms of orthostasis and appropriate compensatory/safety techniques.  Individual Participation: Pt was quietly observant during the session with good participation during the activity portion of the session but little verbal communication during discussion.  Pt steady during standing portions of the activity with no noted/overt LOB.    Participation Level: Moderate   Participation Quality: Minimal Cues   Behavior: Appropriate and Attentive    Speech/Thought Process: Difficult to assess, very little participation during discussion   Affect/Mood: Appropriate and Flat   Insight: Good   Judgement: Good   Individualization: Per above  Modes of Intervention: Activity, Discussion, and Education  Patient Response to Interventions:  Attentive, Engaged, and Interested    Plan: Continue to engage patient in PT/OT groups 1 - 2x/week.  CHARM Hamilton Bertin PT, DPT 06/18/24, 4:59 PM

## 2024-06-19 DIAGNOSIS — F0390 Unspecified dementia without behavioral disturbance: Secondary | ICD-10-CM | POA: Diagnosis not present

## 2024-06-19 DIAGNOSIS — F22 Delusional disorders: Secondary | ICD-10-CM | POA: Diagnosis not present

## 2024-06-19 MED ORDER — TUBERCULIN PPD 5 UNIT/0.1ML ID SOLN
5.0000 [IU] | Freq: Once | INTRADERMAL | Status: AC
  Administered 2024-06-19: 5 [IU] via INTRADERMAL
  Filled 2024-06-19 (×2): qty 0.1

## 2024-06-19 MED ORDER — DOCUSATE SODIUM 100 MG PO CAPS
100.0000 mg | ORAL_CAPSULE | Freq: Every day | ORAL | Status: DC
  Administered 2024-06-20 – 2024-08-30 (×71): 100 mg via ORAL
  Filled 2024-06-19 (×66): qty 1

## 2024-06-19 NOTE — Progress Notes (Signed)
 [facility] MD Progress Note  06/19/2024 1:14 PM Benjamin [name]  MRN:  [id number] Benjamin [name] is a 78 year old male who presents to the inpatient geriatric psych unit after jumping from a two story building. Patient was originally seen at [facility] who referred him to the ED who admitted him here on 03/27/2024. Patient reports that people from the [place] gang were trying to kill him and the only escape was through the bedroom window on the second floor. He currently lives in the house with [name] and his family and has been living with them for 3-4 years without any problems. On 03/19/2024 he went to the doctor and when he got back home the family wouldn't let him leave. He states that after he realized the gang was going to kill him he barricaded the door with his bed and jumped out the window. When he landed he ran away until one of his neighbors found him and called 911.   Subjective: Chart is reviewed and discussed with the treatment team.  APS is working on legal guardianship and placement.  He has guardianship hearing June 19, 2024  Patient is noted to be sitting in the day room.  He remains confused but offers no complaints.  He is not oriented to date or time.  He denies having any physical complaints.  He denies SI/HI/plan and denies auditory/visual hallucinations.  Per nursing no behavioral problems patient is calm and cooperative on the unit. Sleep: Fair  Appetite:  Fair  Past Psychiatric History: see h&P  Family History:  Family History  Problem Relation Age of Onset   Colon cancer Neg Hx    Colon polyps Neg Hx    Stomach cancer Neg Hx    Esophageal cancer Neg Hx    Social History:  Social History   Substance and Sexual Activity  Alcohol Use No     Social History   Substance and Sexual Activity  Drug Use No    Social History   Socioeconomic History   Marital status: Single    Spouse name: Not on file   Number of children: Not on file   Years of  education: Not on file   Highest education level: Not on file  Occupational History   Not on file  Tobacco Use   Smoking status: Former    Types: Cigarettes   Smokeless tobacco: Never  Vaping Use   Vaping status: Never Used  Substance and Sexual Activity   Alcohol use: No   Drug use: No   Sexual activity: Not on file  Other Topics Concern   Not on file  Social History Narrative   Not on file   Social Drivers of Health   Financial Resource Strain: Not on file  Food Insecurity: No Food Insecurity (03/27/2024)   Hunger Vital Sign    Worried About Running Out of Food in the Last Year: Never true    Ran Out of Food in the Last Year: Never true  Transportation Needs: No Transportation Needs (03/27/2024)   PRAPARE - Administrator, Civil Service (Medical): No    Lack of Transportation (Non-Medical): No  Physical Activity: Not on file  Stress: Not on file  Social Connections: Moderately Integrated (03/27/2024)   Social Connection and Isolation Panel    Frequency of Communication with Friends and Family: Twice a week    Frequency of Social Gatherings with Friends and Family: Once a week    Attends Religious Services: 1 to 4  times per year    Active Member of Clubs or Organizations: No    Attends Banker Meetings: 1 to 4 times per year    Marital Status: Divorced   Past Medical History:  Past Medical History:  Diagnosis Date   Allergy    Arthritis    back   Prostate cancer (HCC) dx'd 2010   surg only    Past Surgical History:  Procedure Laterality Date   COLONOSCOPY     EYE SURGERY     PROSTATE SURGERY      Current Medications: Current Facility-Administered Medications  Medication Dose Route Frequency Provider Last Rate Last Admin   acetaminophen  (TYLENOL ) tablet 650 mg  650 mg Oral Q6H PRN Coleman, Carolyn H, NP       alum & mag hydroxide-simeth (MAALOX/MYLANTA) 200-200-20 MG/5ML suspension 30 mL  30 mL Oral Q4H PRN Mardy Elveria DEL, NP        [START ON 06/20/2024] docusate sodium  (COLACE) capsule 100 mg  100 mg Oral Daily Katty Fretwell, MD       fluPHENAZine  (PROLIXIN ) tablet 10 mg  10 mg Oral BID Shrivastava, Aryendra, MD   10 mg at 06/19/24 0750   Or   fluPHENAZine  (PROLIXIN ) injection 5 mg  5 mg Intramuscular BID Shrivastava, Aryendra, MD       magnesium  hydroxide (MILK OF MAGNESIA) suspension 30 mL  30 mL Oral Daily PRN Coleman, Carolyn H, NP       OLANZapine  (ZYPREXA ) injection 5 mg  5 mg Intramuscular TID PRN Mardy Elveria DEL, NP   5 mg at 04/12/24 2230   OLANZapine  zydis (ZYPREXA ) disintegrating tablet 5 mg  5 mg Oral TID PRN Coleman, Carolyn H, NP       polyethylene glycol (MIRALAX  / GLYCOLAX ) packet 17 g  17 g Oral Daily PRN Donnelly Mellow, MD   17 g at 06/04/24 2123    Lab Results:  No results found for this or any previous visit (from the past 48 hours).    Blood Alcohol level:  Lab Results  Component Value Date   [facility] <15 03/26/2024    Metabolic Disorder Labs: Lab Results  Component Value Date   HGBA1C 5.6 04/02/2024   MPG 114 04/02/2024   No results found for: PROLACTIN Lab Results  Component Value Date   CHOL 146 04/02/2024   TRIG 37 04/02/2024   HDL 43 04/02/2024   CHOLHDL 3.4 04/02/2024   VLDL 7 04/02/2024   LDLCALC 96 04/02/2024    Physical Findings: AIMS:  , ,  ,  ,    CIWA:    COWS:      Psychiatric Specialty Exam:  Presentation  General Appearance:  Appropriate for Environment; Bizarre  Eye Contact: Fair  Speech: Clear and Coherent  Speech Volume: Normal    Mood and Affect  Mood: Fine Affect: Congruent   Thought Process  Thought Processes: Disorganized  Descriptions of Associations:Intact  Orientation:Full (Time, Place and Person)  Thought Content: Paranoia at baseline Hallucinations: Denies  Ideas of Reference: Paranoia at baseline Suicidal Thoughts: Denies  Homicidal Thoughts: Denies   Sensorium   Memory: impiared  Judgment: Impaired  Insight: Shallow   Executive Functions  Concentration: Fair  Attention Span: Fair  Recall: [employer] of Knowledge: Fair  Language: Fair   Psychomotor Activity  Psychomotor Activity: No data recorded  Musculoskeletal: Strength & Muscle Tone: within normal limits Gait & Station: normal Assets  Assets: Manufacturing Systems Engineer; Resilience    Physical Exam: Physical  Exam Vitals and nursing note reviewed.    ROS Blood pressure (!) 127/91, pulse 66, temperature 97.7 F (36.5 C), resp. rate 18, height 6' (1.829 m), weight 100 kg, SpO2 98%. Body mass index is 29.91 kg/m.  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  Patient needs to be monitored closely for ongoing psychosis and paranoid delusions.  Given patient's confusion and chronic paranoia, no family support, APS report has been made and it got screened in for further evaluation by APS.   Differential diagnosis include: unspecified psychosis, delusion disorder, dementia, schizophrenia/schizoaffective disorder   Treatment Plan Summary: Requested the social work team to reach out to APS for safety concerns in the community as patient has chronic paranoia and confusion and is currently homeless patient remains confused and paranoid and spite of being compliant with medications.  Patient displays cognitive delay and deficits in executive function like planning, organizing and processing information.  At this point patient lacks capacity to make medical decisions.  We recommend APS referral for possible legal guardianship as he has no family or legal next of kin to help with decision-making.   Safety and Monitoring:              -- InVoluntary admission to inpatient psychiatric unit for safety, stabilization and treatment             -- Daily contact with patient to assess and evaluate symptoms and progress in treatment             -- Patient's case to be discussed in multi-disciplinary team meeting             -- Observation Level: q15 minute checks             -- Vital signs:  q12 hours             -- Precautions: suicide, elopement, and assault   2. Psychiatric Diagnoses and Treatment:              Continue Prolixin  to 10 mg twice daily. discontinued Zyprexa .  -- The risks/benefits/side-effects/alternatives to this medication were discussed in detail with the patient and time was given for questions. The patient consents to medication trial.                -- Metabolic profile and EKG monitoring obtained while on an atypical antipsychotic (BMI: Lipid Panel: HbgA1c: QTc:)              -- Encouraged patient to participate in unit milieu and in scheduled group therapies                            3. Medical Issues Being Addressed:    No urgent medical needs noted  4. Discharge Planning:   -- Social work and case management to assist with discharge planning and identification of hospital follow-up needs prior to discharge  -- Estimated LOS: 3-4 days  Loreto Loescher, MD 06/19/2024, 1:14 PM

## 2024-06-19 NOTE — Group Note (Signed)
 Date:  06/19/2024 Time:  10:27 AM  Group Topic/Focus:  Emotional Education:   The focus of this group is to discuss what feelings/emotions are, and how they are experienced.  Emotional regulation is the ability to manage your emotions, not suppress them, by recognizing, understanding, and responding to them in healthy ways. It involves influencing which emotions you have, when you have them, and how you experience and express them to achieve a desired outcome, rather than simply reacting impulsively. For example, taking a deep breath when frustrated instead of throwing things is a form of emotional regulation.   Participation Level:  Did Not Attend   Benjamin [name] Gavel 06/19/2024, 10:27 AM

## 2024-06-19 NOTE — Progress Notes (Signed)
   06/19/24 1000  Psych Admission Type (Psych Patients Only)  Admission Status Voluntary  Psychosocial Assessment  Patient Complaints None  Eye Contact Brief  Facial Expression Flat  Affect Flat  Speech Logical/coherent  Interaction Assertive  Motor Activity Slow  Appearance/Hygiene In scrubs  Behavior Characteristics Appropriate to situation  Mood Pleasant  Thought Process  Coherency WDL  Content WDL  Delusions None reported or observed  Perception WDL  Hallucination None reported or observed  Judgment Impaired  Confusion Mild  Danger to Self  Current suicidal ideation? Denies  Agreement Not to Harm Self Yes  Description of Agreement verbal  Danger to Others  Danger to Others None reported or observed

## 2024-06-19 NOTE — Group Note (Signed)
 Recreation Therapy Group Note   Group Topic:Coping Skills  Group Date: 06/19/2024 Start Time: 1100 End Time: 1130 Facilitators: Celestia Jeoffrey BRAVO, LRT, CTRS Location: Dayroom  Group Description: Music. Patients are encouraged to name their favorite song(s) for LRT to play song through speaker for group to hear. Patients educated on the definition of leisure and the importance of having different leisure interests outside of the hospital. Group discussed how leisure activities can often be used as pharmacologist and that listening to music and being outside are examples.    Goal Area(s) Addressed:  Patient will identify a current leisure interest.  Patient will practice making a positive decision. Patient will have the opportunity to try a new leisure activity.   Affect/Mood: N/A   Participation Level: Did not attend    Clinical Observations/Individualized Feedback: Patient did not attend group.   Plan: Continue to engage patient in RT group sessions 2-3x/week.   Jeoffrey BRAVO Celestia, LRT, CTRS 06/19/2024 1:32 PM

## 2024-06-19 NOTE — Group Note (Deleted)
 Recreation Therapy Group Note   Group Topic:Coping Skills  Group Date: 06/19/2024 Start Time: 1100 End Time: 1130 Facilitators: Celestia Jeoffrey BRAVO, LRT Location: Dayroom  Group Description: Music. Patients are encouraged to name their favorite song(s) for LRT to play song through speaker for group to hear, while in the courtyard getting fresh air and sunlight. Patients educated on the definition of leisure and the importance of having different leisure interests outside of the hospital. Group discussed how leisure activities can often be used as pharmacologist and that listening to music and being outside are examples.    Goal Area(s) Addressed:  Patient will identify a current leisure interest.  Patient will practice making a positive decision. Patient will have the opportunity to try a new leisure activity.       Affect/Mood: {RT BHH Affect/Mood:26271}   Participation Level: {RT BHH Participation Level:26267}   Participation Quality: {RT BHH Participation Quality:26268}   Behavior: {RT BHH Group Behavior:26269}   Speech/Thought Process: {RT BHH Speech/Thought:26276}   Insight: {RT BHH Insight:26272}   Judgement: {RT BHH Judgement:26278}   Modes of Intervention: {RT BHH Modes of Intervention:26277}   Patient Response to Interventions:  {RT BHH Patient Response to Intervention:26274}   Education Outcome:  {RT BHH Education Outcome:26279}   Clinical Observations/Individualized Feedback: *** was *** in their participation of session activities and group discussion. Pt identified ***   Plan: {RT BHH Tx Eojw:73719}   Jeoffrey BRAVO Celestia, LRT,  06/19/2024 1:28 PM

## 2024-06-19 NOTE — Group Note (Signed)
 Recreation Therapy Group Note   Group Topic:Emotion Expression  Group Date: 06/19/2024 Start Time: 1500 End Time: 1600 Facilitators: Celestia Jeoffrey BRAVO, LRT, CTRS Location: Dayroom  Group Description: Painting a Diplomatic Services Operational Officer. Patients and LRT discuss what it means to be "at peace", what it feels like physically and mentally. Pts are given a canvas and watercolor paint to use and encouraged to draw their idea of a peaceful place. Pts and LRT discuss how they use this in their daily life post discharge. Pts are encouraged to take their canvas home with them as a reminder to find their peaceful place whenever they are feeling depressed, anxious, etc.    Goal Area(s) Addressed:  Patient will identify what it means to experience a "peaceful" emotion. Patient will identify a new coping skill.  Patient will express their emotions through art. Patients will increase communication by talking with LRT and peers while in group.  Affect/Mood: N/A   Participation Level: Did not attend    Clinical Observations/Individualized Feedback: Patient did not attend group.   Plan: Continue to engage patient in RT group sessions 2-3x/week.   Jeoffrey BRAVO Celestia, LRT, CTRS 06/19/2024 4:44 PM

## 2024-06-19 NOTE — Group Note (Signed)
 LCSW Group Therapy Note  Group Date: 06/19/2024 Start Time: 1315 End Time: 1400   Type of Therapy and Topic:  Group Therapy - Healthy vs Unhealthy Coping Skills  Participation Level:  Did Not Attend   Description of Group The focus of this group was to determine what unhealthy coping techniques typically are used by group members and what healthy coping techniques would be helpful in coping with various problems. Patients were guided in becoming aware of the differences between healthy and unhealthy coping techniques. Patients were asked to identify 2-3 healthy coping skills they would like to learn to use more effectively.  Therapeutic Goals Patients learned that coping is what human beings do all day long to deal with various situations in their lives Patients defined and discussed healthy vs unhealthy coping techniques Patients identified their preferred coping techniques and identified whether these were healthy or unhealthy Patients determined 2-3 healthy coping skills they would like to become more familiar with and use more often. Patients provided support and ideas to each other   Summary of Patient Progress: X   Therapeutic Modalities Cognitive Behavioral Therapy Motivational Interviewing  Benjamin [name], [place] 06/19/2024  2:26 PM

## 2024-06-19 NOTE — Group Note (Signed)
 Date:  06/19/2024 Time:  9:08 PM  Group Topic/Focus:  Movie Group The purpose of this group is for patients to come together and watch a soothing movie of their liking while having snack with their peers    Participation Level:  Active  Participation Quality:  Appropriate  Affect:  Appropriate  Cognitive:  Appropriate  Insight: Appropriate  Engagement in Group:  Engaged  Modes of Intervention:  Activity  Additional Comments:    Benjamin [name] 06/19/2024, 9:08 PM

## 2024-06-20 ENCOUNTER — Inpatient Hospital Stay

## 2024-06-20 DIAGNOSIS — F22 Delusional disorders: Secondary | ICD-10-CM | POA: Diagnosis not present

## 2024-06-20 DIAGNOSIS — F0390 Unspecified dementia without behavioral disturbance: Secondary | ICD-10-CM | POA: Diagnosis not present

## 2024-06-20 DIAGNOSIS — Z0000 Encounter for general adult medical examination without abnormal findings: Secondary | ICD-10-CM | POA: Diagnosis not present

## 2024-06-20 NOTE — Plan of Care (Signed)

## 2024-06-20 NOTE — Progress Notes (Signed)
 SI/HI/AVH: denies all  Behavior/Mood: appropriate/pleasant    Interaction/Group attendance: assertive/0 of 2 groups    Medication/PRNs: compliant/ none   Pain: denies  Other: patient had a PPD placed 10.28 and chest xray today 10.29

## 2024-06-20 NOTE — Group Note (Signed)
 Date:  06/20/2024 Time:  9:06 PM  Group Topic/Focus:  Wrap-Up Group:   The focus of this group is to help patients review their daily goal of treatment and discuss progress on daily workbooks.    Participation Level:  Did Not Attend  Additional Comments:    Kristen VEAR Gibbon 06/20/2024, 9:06 PM

## 2024-06-20 NOTE — Plan of Care (Signed)
  Problem: Pain Managment: Goal: General experience of comfort will improve and/or be controlled Outcome: Progressing   Problem: Safety: Goal: Ability to remain free from injury will improve Outcome: Progressing

## 2024-06-20 NOTE — Plan of Care (Signed)
  Problem: Education: Goal: Knowledge of General Education information will improve Description: Including pain rating scale, medication(s)/side effects and non-pharmacologic comfort measures Outcome: Progressing   Problem: Health Behavior/Discharge Planning: Goal: Ability to manage health-related needs will improve Outcome: Progressing   Problem: Clinical Measurements: Goal: Ability to maintain clinical measurements within normal limits will improve Outcome: Progressing Goal: Will remain free from infection Outcome: Progressing Goal: Diagnostic test results will improve Outcome: Progressing Goal: Respiratory complications will improve Outcome: Progressing Goal: Cardiovascular complication will be avoided Outcome: Progressing   Problem: Activity: Goal: Risk for activity intolerance will decrease Outcome: Progressing   Problem: Nutrition: Goal: Adequate nutrition will be maintained Outcome: Progressing   Problem: Coping: Goal: Level of anxiety will decrease Outcome: Progressing   Problem: Elimination: Goal: Will not experience complications related to bowel motility Outcome: Progressing Goal: Will not experience complications related to urinary retention Outcome: Progressing   Problem: Pain Managment: Goal: General experience of comfort will improve and/or be controlled Outcome: Progressing   Problem: Safety: Goal: Ability to remain free from injury will improve Outcome: Progressing   Problem: Skin Integrity: Goal: Risk for impaired skin integrity will decrease Outcome: Progressing   Problem: Education: Goal: Knowledge of [place] [facility] Education information/materials will improve Outcome: Progressing Goal: Emotional status will improve Outcome: Progressing Goal: Mental status will improve Outcome: Progressing Goal: Verbalization of understanding the information provided will improve Outcome: Progressing   Problem: Activity: Goal: Interest or  engagement in activities will improve Outcome: Progressing Goal: Sleeping patterns will improve Outcome: Progressing   Problem: Coping: Goal: Ability to verbalize frustrations and anger appropriately will improve Outcome: Progressing Goal: Ability to demonstrate self-control will improve Outcome: Progressing   Problem: Health Behavior/Discharge Planning: Goal: Identification of resources available to assist in meeting health care needs will improve Outcome: Progressing Goal: Compliance with treatment plan for underlying cause of condition will improve Outcome: Progressing   Problem: Physical Regulation: Goal: Ability to maintain clinical measurements within normal limits will improve Outcome: Progressing   Problem: Safety: Goal: Periods of time without injury will increase Outcome: Progressing

## 2024-06-20 NOTE — Progress Notes (Signed)
 Patient offsite to receive chest x-ray.

## 2024-06-20 NOTE — Group Note (Signed)
 Date:  06/20/2024 Time:  10:13 AM  Group Topic/Focus:  Community Meeting    Participation Level:  Did Not Attend    [name] 06/20/2024, 10:13 AM

## 2024-06-20 NOTE — Group Note (Signed)
 Date:  06/20/2024 Time:  11:26 PM  Group Topic/Focus:  Self Care:   The focus of this group is to help patients understand the importance of self-care in order to improve or restore emotional, physical, spiritual, interpersonal, and financial health.    Participation Level:  Did Not Attend  Participation Quality:  Did Not Attend  Affect:  Did Not Attend  Cognitive:  Did Not Attend  Insight: None  Engagement in Group:  None  Modes of Intervention:  Did Not Attend  Additional Comments:    Benjamin [name] 06/20/2024, 11:26 PM

## 2024-06-20 NOTE — Progress Notes (Signed)
   06/20/24 2200  Psych Admission Type (Psych Patients Only)  Admission Status Voluntary  Psychosocial Assessment  Patient Complaints None  Eye Contact Brief  Facial Expression Flat  Affect Flat  Speech Logical/coherent  Interaction Assertive  Motor Activity Slow  Appearance/Hygiene In scrubs  Behavior Characteristics Appropriate to situation  Mood Pleasant  Thought Process  Coherency WDL  Content WDL  Delusions None reported or observed  Perception WDL  Hallucination None reported or observed  Judgment Impaired  Confusion Mild  Danger to Self  Current suicidal ideation? Denies  Self-Injurious Behavior No self-injurious ideation or behavior indicators observed or expressed   Agreement Not to Harm Self Yes  Description of Agreement verbal  Danger to Others  Danger to Others None reported or observed   Mood/Behavior:  Pleasant and cooperative.    Psych assessment: Denies SI/HI and AVH.     Interaction / Group attendance:  Present in the milieu. Minimal interaction with peers and staff.  Attended group.   Medication/ PRNs: Compliant with scheduled medications. No prns required.   Pain: Denies  15 min checks in place for safety.

## 2024-06-20 NOTE — Progress Notes (Signed)
 [facility] MD Progress Note  06/20/2024 12:30 PM Benjamin [name]  MRN:  [id number] Benjamin [name] is a 78 year old male who presents to the inpatient geriatric psych unit after jumping from a two story building. Patient was originally seen at [facility] who referred him to the ED who admitted him here on 03/27/2024. Patient reports that people from the [place] gang were trying to kill him and the only escape was through the bedroom window on the second floor. He currently lives in the house with [name] and his family and has been living with them for 3-4 years without any problems. On 03/19/2024 he went to the doctor and when he got back home the family wouldn't let him leave. He states that after he realized the gang was going to kill him he barricaded the door with his bed and jumped out the window. When he landed he ran away until one of his neighbors found him and called 911.   Subjective: Chart is reviewed and discussed with the treatment team.  APS is working on legal guardianship and placement.  He has guardianship hearing June 19, 2024 Patient is noted to be resting in his room.  He offers no complaints.  Remains very confused but is pleasant.  He reports fair appetite and sleep.  He denies auditory/visual hallucinations.  Suicide in response never  Sleep: Fair  Appetite:  Fair  Past Psychiatric History: see h&P  Family History:  Family History  Problem Relation Age of Onset   Colon cancer Neg Hx    Colon polyps Neg Hx    Stomach cancer Neg Hx    Esophageal cancer Neg Hx    Social History:  Social History   Substance and Sexual Activity  Alcohol Use No     Social History   Substance and Sexual Activity  Drug Use No    Social History   Socioeconomic History   Marital status: Single    Spouse name: Not on file   Number of children: Not on file   Years of education: Not on file   Highest education level: Not on file  Occupational History   Not on file   Tobacco Use   Smoking status: Former    Types: Cigarettes   Smokeless tobacco: Never  Vaping Use   Vaping status: Never Used  Substance and Sexual Activity   Alcohol use: No   Drug use: No   Sexual activity: Not on file  Other Topics Concern   Not on file  Social History Narrative   Not on file   Social Drivers of Health   Financial Resource Strain: Not on file  Food Insecurity: No Food Insecurity (03/27/2024)   Hunger Vital Sign    Worried About Running Out of Food in the Last Year: Never true    Ran Out of Food in the Last Year: Never true  Transportation Needs: No Transportation Needs (03/27/2024)   PRAPARE - Administrator, Civil Service (Medical): No    Lack of Transportation (Non-Medical): No  Physical Activity: Not on file  Stress: Not on file  Social Connections: Moderately Integrated (03/27/2024)   Social Connection and Isolation Panel    Frequency of Communication with Friends and Family: Twice a week    Frequency of Social Gatherings with Friends and Family: Once a week    Attends Religious Services: 1 to 4 times per year    Active Member of [employer] or Organizations: No    Attends [employer]  or Organization Meetings: 1 to 4 times per year    Marital Status: Divorced   Past Medical History:  Past Medical History:  Diagnosis Date   Allergy    Arthritis    back   Prostate cancer (HCC) dx'd 2010   surg only    Past Surgical History:  Procedure Laterality Date   COLONOSCOPY     EYE SURGERY     PROSTATE SURGERY      Current Medications: Current Facility-Administered Medications  Medication Dose Route Frequency Provider Last Rate Last Admin   acetaminophen  (TYLENOL ) tablet 650 mg  650 mg Oral Q6H PRN Coleman, Carolyn H, NP       alum & mag hydroxide-simeth (MAALOX/MYLANTA) 200-200-20 MG/5ML suspension 30 mL  30 mL Oral Q4H PRN Coleman, Carolyn H, NP       docusate sodium  (COLACE) capsule 100 mg  100 mg Oral Daily Janelle Culton, MD   100 mg at 06/20/24  9076   fluPHENAZine  (PROLIXIN ) tablet 10 mg  10 mg Oral BID Shrivastava, Aryendra, MD   10 mg at 06/20/24 9076   Or   fluPHENAZine  (PROLIXIN ) injection 5 mg  5 mg Intramuscular BID Shrivastava, Aryendra, MD       magnesium  hydroxide (MILK OF MAGNESIA) suspension 30 mL  30 mL Oral Daily PRN Mardy Elveria DEL, NP       OLANZapine  (ZYPREXA ) injection 5 mg  5 mg Intramuscular TID PRN Mardy Elveria DEL, NP   5 mg at 04/12/24 2230   OLANZapine  zydis (ZYPREXA ) disintegrating tablet 5 mg  5 mg Oral TID PRN Coleman, Carolyn H, NP       polyethylene glycol (MIRALAX  / GLYCOLAX ) packet 17 g  17 g Oral Daily PRN Donnelly Mellow, MD   17 g at 06/04/24 2123   tuberculin injection 5 Units  5 Units Intradermal Once Arlicia Paquette, MD   5 Units at 06/19/24 2057    Lab Results:  No results found for this or any previous visit (from the past 48 hours).    Blood Alcohol level:  Lab Results  Component Value Date   [facility] <15 03/26/2024    Metabolic Disorder Labs: Lab Results  Component Value Date   HGBA1C 5.6 04/02/2024   MPG 114 04/02/2024   No results found for: PROLACTIN Lab Results  Component Value Date   CHOL 146 04/02/2024   TRIG 37 04/02/2024   HDL 43 04/02/2024   CHOLHDL 3.4 04/02/2024   VLDL 7 04/02/2024   LDLCALC 96 04/02/2024    Physical Findings: AIMS:  , ,  ,  ,    CIWA:    COWS:      Psychiatric Specialty Exam:  Presentation  General Appearance:  Appropriate for Environment; Bizarre  Eye Contact: Fair  Speech: Clear and Coherent  Speech Volume: Normal    Mood and Affect  Mood: Fine Affect: Congruent   Thought Process  Thought Processes: Disorganized  Descriptions of Associations:Intact  Orientation:Full (Time, Place and Person)  Thought Content: Paranoia at baseline Hallucinations: Denies  Ideas of Reference: Paranoia at baseline Suicidal Thoughts: Denies  Homicidal Thoughts: Denies   Sensorium   Memory: impiared  Judgment: Impaired  Insight: Shallow   Executive Functions  Concentration: Fair  Attention Span: Fair  Recall: [employer] of Knowledge: Fair  Language: Fair   Psychomotor Activity  Psychomotor Activity: No data recorded  Musculoskeletal: Strength & Muscle Tone: within normal limits Gait & Station: normal Assets  Assets: Manufacturing Systems Engineer; Resilience  Physical Exam: Physical Exam Vitals and nursing note reviewed.    ROS Blood pressure (!) 81/58, pulse 74, temperature 97.6 F (36.4 C), resp. rate 16, height 6' (1.829 m), weight 100 kg, SpO2 100%. Body mass index is 29.91 kg/m.  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  Patient needs to be monitored closely for ongoing psychosis and paranoid delusions.  Given patient's confusion and chronic paranoia, no family support, APS report has been made and it got screened in for further evaluation by APS.   Differential diagnosis include: unspecified psychosis, delusion disorder, dementia, schizophrenia/schizoaffective disorder   Treatment Plan Summary: Requested the social work team to reach out to APS for safety concerns in the community as patient has chronic paranoia and confusion and is currently homeless patient remains confused and paranoid and spite of being compliant with medications.  Patient displays cognitive delay and deficits in executive function like planning, organizing and processing information.  At this point patient lacks capacity to make medical decisions.  We recommend APS referral for possible legal guardianship as he has no family or legal next of kin to help with decision-making.   Safety and Monitoring:              -- InVoluntary admission to inpatient psychiatric unit for safety, stabilization and treatment             -- Daily contact with patient to assess and evaluate symptoms and progress in treatment             -- Patient's case to be discussed in multi-disciplinary team meeting             -- Observation Level: q15 minute checks             -- Vital signs:  q12 hours             -- Precautions: suicide, elopement, and assault   2. Psychiatric Diagnoses and Treatment:              Continue Prolixin  to 10 mg twice daily. discontinued Zyprexa .  -- The risks/benefits/side-effects/alternatives to this medication were discussed in detail with the patient and time was given for questions. The patient consents to medication trial.                -- Metabolic profile and EKG monitoring obtained while on an atypical antipsychotic (BMI: Lipid Panel: HbgA1c: QTc:)              -- Encouraged patient to participate in unit milieu and in scheduled group therapies                            3. Medical Issues Being Addressed:    No urgent medical needs noted  4. Discharge Planning:   -- Social work and case management to assist with discharge planning and identification of hospital follow-up needs prior to discharge  -- Estimated LOS: 3-4 days  Allyn Foil, MD 06/20/2024, 12:30 PM

## 2024-06-20 NOTE — Progress Notes (Signed)
 Pt is alert and oriented. Flat affect. Isolative. Denies anxiety and depression. Remain in room throughout the shift. Did not attend group. Po med complaint. Administered Tuberculin injection 5 units to left forearm at 2057. Tol well. Q 15 min checks maintained for safety. No behavior issues noted.  Denies SI/HI/AVH. Cont with POC.    06/19/24 2200  Psych Admission Type (Psych Patients Only)  Admission Status Voluntary  Psychosocial Assessment  Patient Complaints None  Eye Contact Brief  Facial Expression Flat  Affect Flat  Speech Logical/coherent  Interaction Assertive  Motor Activity Slow  Appearance/Hygiene In scrubs  Behavior Characteristics Appropriate to situation  Mood Pleasant  Thought Process  Coherency WDL  Content WDL  Delusions None reported or observed  Perception WDL  Hallucination None reported or observed  Judgment Impaired  Confusion Mild  Danger to Self  Current suicidal ideation? Denies

## 2024-06-21 DIAGNOSIS — F0390 Unspecified dementia without behavioral disturbance: Secondary | ICD-10-CM | POA: Diagnosis not present

## 2024-06-21 DIAGNOSIS — F22 Delusional disorders: Secondary | ICD-10-CM | POA: Diagnosis not present

## 2024-06-21 NOTE — Progress Notes (Signed)
   06/21/24 2100  PPD Results  Does patient have an induration at the injection site? No  Induration(mm) 0 mm   Pt is pleasant and cooperative. PPD shows 0mm induration to LFA. No redness, itching or tenderness at site. No further action required. No c/o pain/discomfort noted.

## 2024-06-21 NOTE — Group Note (Signed)
 Recreation Therapy Group Note   Group Topic:Coping Skills  Group Date: 06/21/2024 Start Time: 1500 End Time: 1535 Facilitators: Celestia Jeoffrey BRAVO, LRT, CTRS Location: Courtyard  Group Description: Music. Patients are encouraged to name their favorite song(s) for LRT to play song through speaker for group to hear, while in the courtyard getting fresh air and sunlight. Patients educated on the definition of leisure and the importance of having different leisure interests outside of the hospital. Group discussed how leisure activities can often be used as pharmacologist and that listening to music and being outside are examples.    Goal Area(s) Addressed:  Patient will identify a current leisure interest.  Patient will practice making a positive decision. Patient will have the opportunity to try a new leisure activity.   Affect/Mood: N/A   Participation Level: Did not attend    Clinical Observations/Individualized Feedback: Patient did not attend group.   Plan: Continue to engage patient in RT group sessions 2-3x/week.   Jeoffrey BRAVO Celestia, LRT, CTRS 06/21/2024 4:48 PM

## 2024-06-21 NOTE — Group Note (Signed)

## 2024-06-21 NOTE — Progress Notes (Signed)
 [facility] MD Progress Note  06/21/2024 8:33 PM Benjamin [name]  MRN:  [id number] Benjamin [name] is a 78 year old male who presents to the inpatient geriatric psych unit after jumping from a two story building. Patient was originally seen at [facility] who referred him to the ED who admitted him here on 03/27/2024. Patient reports that people from the [place] gang were trying to kill him and the only escape was through the bedroom window on the second floor. He currently lives in the house with [name] and his family and has been living with them for 3-4 years without any problems. On 03/19/2024 he went to the doctor and when he got back home the family wouldn't let him leave. He states that after he realized the gang was going to kill him he barricaded the door with his bed and jumped out the window. When he landed he ran away until one of his neighbors found him and called 911.   Subjective: Chart is reviewed and discussed with the treatment team.  APS is working on legal guardianship and placement.  He has guardianship hearing June 19, 2024 Patient is sleeping room.  He reports that he is waiting for some close to take shower today.  Per nursing patient is taking his medications with no reported side effects.  Patient is also noted to be visible on the unit and participating in groups.  Patient denies SI/HI/plan and denies auditory/visual  Sleep: Fair  Appetite:  Fair  Past Psychiatric History: see h&P  Family History:  Family History  Problem Relation Age of Onset   Colon cancer Neg Hx    Colon polyps Neg Hx    Stomach cancer Neg Hx    Esophageal cancer Neg Hx    Social History:  Social History   Substance and Sexual Activity  Alcohol Use No     Social History   Substance and Sexual Activity  Drug Use No    Social History   Socioeconomic History   Marital status: Single    Spouse name: Not on file   Number of children: Not on file   Years of education: Not on  file   Highest education level: Not on file  Occupational History   Not on file  Tobacco Use   Smoking status: Former    Types: Cigarettes   Smokeless tobacco: Never  Vaping Use   Vaping status: Never Used  Substance and Sexual Activity   Alcohol use: No   Drug use: No   Sexual activity: Not on file  Other Topics Concern   Not on file  Social History Narrative   Not on file   Social Drivers of Health   Financial Resource Strain: Not on file  Food Insecurity: No Food Insecurity (03/27/2024)   Hunger Vital Sign    Worried About Running Out of Food in the Last Year: Never true    Ran Out of Food in the Last Year: Never true  Transportation Needs: No Transportation Needs (03/27/2024)   PRAPARE - Administrator, Civil Service (Medical): No    Lack of Transportation (Non-Medical): No  Physical Activity: Not on file  Stress: Not on file  Social Connections: Moderately Integrated (03/27/2024)   Social Connection and Isolation Panel    Frequency of Communication with Friends and Family: Twice a week    Frequency of Social Gatherings with Friends and Family: Once a week    Attends Religious Services: 1 to 4 times per year  Active Member of Clubs or Organizations: No    Attends [employer] Meetings: 1 to 4 times per year    Marital Status: Divorced   Past Medical History:  Past Medical History:  Diagnosis Date   Allergy    Arthritis    back   Prostate cancer (HCC) dx'd 2010   surg only    Past Surgical History:  Procedure Laterality Date   COLONOSCOPY     EYE SURGERY     PROSTATE SURGERY      Current Medications: Current Facility-Administered Medications  Medication Dose Route Frequency Provider Last Rate Last Admin   acetaminophen  (TYLENOL ) tablet 650 mg  650 mg Oral Q6H PRN Coleman, Carolyn H, NP       alum & mag hydroxide-simeth (MAALOX/MYLANTA) 200-200-20 MG/5ML suspension 30 mL  30 mL Oral Q4H PRN Coleman, Carolyn H, NP       docusate sodium   (COLACE) capsule 100 mg  100 mg Oral Daily Namish Krise, MD   100 mg at 06/21/24 9088   fluPHENAZine  (PROLIXIN ) tablet 10 mg  10 mg Oral BID Shrivastava, Aryendra, MD   10 mg at 06/21/24 9088   Or   fluPHENAZine  (PROLIXIN ) injection 5 mg  5 mg Intramuscular BID Shrivastava, Aryendra, MD       magnesium  hydroxide (MILK OF MAGNESIA) suspension 30 mL  30 mL Oral Daily PRN Coleman, Carolyn H, NP       OLANZapine  (ZYPREXA ) injection 5 mg  5 mg Intramuscular TID PRN Mardy Elveria DEL, NP   5 mg at 04/12/24 2230   OLANZapine  zydis (ZYPREXA ) disintegrating tablet 5 mg  5 mg Oral TID PRN Coleman, Carolyn H, NP       polyethylene glycol (MIRALAX  / GLYCOLAX ) packet 17 g  17 g Oral Daily PRN Donnelly Mellow, MD   17 g at 06/04/24 2123   tuberculin injection 5 Units  5 Units Intradermal Once Marylynn Rigdon, MD   5 Units at 06/19/24 2057    Lab Results:  No results found for this or any previous visit (from the past 48 hours).    Blood Alcohol level:  Lab Results  Component Value Date   [facility] <15 03/26/2024    Metabolic Disorder Labs: Lab Results  Component Value Date   HGBA1C 5.6 04/02/2024   MPG 114 04/02/2024   No results found for: PROLACTIN Lab Results  Component Value Date   CHOL 146 04/02/2024   TRIG 37 04/02/2024   HDL 43 04/02/2024   CHOLHDL 3.4 04/02/2024   VLDL 7 04/02/2024   LDLCALC 96 04/02/2024    Physical Findings: AIMS:  , ,  ,  ,    CIWA:    COWS:      Psychiatric Specialty Exam:  Presentation  General Appearance:  Appropriate for Environment; Bizarre  Eye Contact: Fair  Speech: Clear and Coherent  Speech Volume: Normal    Mood and Affect  Mood: Fine Affect: Congruent   Thought Process  Thought Processes: Disorganized  Descriptions of Associations:Intact  Orientation:Full (Time, Place and Person)  Thought Content: Paranoia at baseline Hallucinations: Denies  Ideas of Reference: Paranoia at baseline Suicidal Thoughts:  Denies  Homicidal Thoughts: Denies   Sensorium  Memory: impiared  Judgment: Impaired  Insight: Shallow   Executive Functions  Concentration: Fair  Attention Span: Fair  Recall: [employer] of Knowledge: Fair  Language: Fair   Psychomotor Activity  Psychomotor Activity: No data recorded  Musculoskeletal: Strength & Muscle Tone: within normal limits Gait &  Station: normal Assets  Assets: Manufacturing Systems Engineer; Resilience    Physical Exam: Physical Exam Vitals and nursing note reviewed.    ROS Blood pressure 106/71, pulse 78, temperature 97.9 F (36.6 C), resp. rate 18, height 6' (1.829 m), weight 100 kg, SpO2 99%. Body mass index is 29.91 kg/m.  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  Patient needs to be monitored closely for ongoing psychosis and paranoid delusions.  Given patient's confusion and chronic paranoia, no family support, APS report has been made and it got screened in for further evaluation by APS.   Differential diagnosis include: unspecified psychosis, delusion disorder, dementia, schizophrenia/schizoaffective disorder   Treatment Plan Summary: Requested the social work team to reach out to APS for safety concerns in the community as patient has chronic paranoia and confusion and is currently homeless patient remains confused and paranoid and spite of being compliant with medications.  Patient displays cognitive delay and deficits in executive function like planning, organizing and processing information.  At this point patient lacks capacity to make medical decisions.  We recommend APS referral for possible legal guardianship as he has no family or legal next of kin to help with  decision-making.   Safety and Monitoring:             -- InVoluntary admission to inpatient psychiatric unit for safety, stabilization and treatment             -- Daily contact with patient to assess and evaluate symptoms and progress in treatment             -- Patient's case to be discussed in multi-disciplinary team meeting             -- Observation Level: q15 minute checks             -- Vital signs:  q12 hours             -- Precautions: suicide, elopement, and assault   2. Psychiatric Diagnoses and Treatment:              Continue Prolixin  to 10 mg twice daily. discontinued Zyprexa .  -- The risks/benefits/side-effects/alternatives to this medication were discussed in detail with the patient and time was given for questions. The patient consents to medication trial.                -- Metabolic profile and EKG monitoring obtained while on an atypical antipsychotic (BMI: Lipid Panel: HbgA1c: QTc:)              -- Encouraged patient to participate in unit milieu and in scheduled group therapies                            3. Medical Issues Being Addressed:    No urgent medical needs noted  4. Discharge Planning:   -- Social work and case management to assist with discharge planning and identification of hospital follow-up needs prior to discharge  -- Estimated LOS: 3-4 days  Allyn Foil, MD 06/21/2024, 8:33 PM

## 2024-06-21 NOTE — Plan of Care (Signed)
   Problem: Activity: Goal: Risk for activity intolerance will decrease Outcome: Progressing   Problem: Nutrition: Goal: Adequate nutrition will be maintained Outcome: Progressing

## 2024-06-21 NOTE — Group Note (Signed)
 Recreation Therapy Group Note   Group Topic:Relaxation  Group Date: 06/21/2024 Start Time: 1105 End Time: 1130 Facilitators: Celestia Jeoffrey BRAVO, LRT, CTRS Location: Dayroom  Group Description: Meditation. LRT and patients discussed what they know about meditation and mindfulness. LRT played a Deep Breathing Meditation exercise script for patients to follow along to. LRT and patients discussed how meditation and deep breathing can be used as a coping skill post--discharge to help manage symptoms of stress.   Goal Area(s) Addressed: Patient will practice using relaxation technique. Patient will identify a new coping skill.  Patient will follow multistep directions to reduce anxiety and stress.   Affect/Mood: N/A   Participation Level: Did not attend    Clinical Observations/Individualized Feedback: Patient did not attend group.   Plan: Continue to engage patient in RT group sessions 2-3x/week.   Jeoffrey BRAVO Celestia, LRT, CTRS 06/21/2024 2:01 PM

## 2024-06-21 NOTE — Group Note (Signed)
 Date:  06/21/2024 Time:  8:33 PM  Group Topic/Focus:  Building Self Esteem:   The Focus of this group is helping patients become aware of the effects of self-esteem on their lives, the things they and others do that enhance or undermine their self-esteem, seeing the relationship between their level of self-esteem and the choices they make and learning ways to enhance self-esteem.    Participation Level:  Active  Participation Quality:  Appropriate  Affect:  Appropriate  Cognitive:  Appropriate  Insight: Good  Engagement in Group:  Engaged  Modes of Intervention:  Discussion  Additional Comments:    Benjamin [name] 06/21/2024, 8:33 PM

## 2024-06-21 NOTE — Progress Notes (Signed)
   06/21/24 0800  Psych Admission Type (Psych Patients Only)  Admission Status Voluntary  Psychosocial Assessment  Patient Complaints None  Eye Contact Brief  Facial Expression Flat  Affect Flat  Speech Logical/coherent  Interaction Assertive  Motor Activity Slow  Appearance/Hygiene In scrubs  Behavior Characteristics Appropriate to situation  Mood Pleasant  Thought Process  Coherency WDL  Content WDL  Delusions None reported or observed  Perception WDL  Hallucination None reported or observed  Judgment Impaired  Confusion Mild  Danger to Self  Current suicidal ideation? Denies  Agreement Not to Harm Self Yes  Description of Agreement verbal  Danger to Others  Danger to Others None reported or observed

## 2024-06-21 NOTE — Group Note (Signed)
 Date:  06/21/2024 Time:  10:54 AM  Group Topic/Focus:  Healthy Communication:   The focus of this group is to discuss communication, barriers to communication, as well as healthy ways to communicate with others.    Participation Level:  Did Not Attend  Leigh VEAR Pais 06/21/2024, 10:54 AM

## 2024-06-22 DIAGNOSIS — F0390 Unspecified dementia without behavioral disturbance: Secondary | ICD-10-CM | POA: Diagnosis not present

## 2024-06-22 DIAGNOSIS — F22 Delusional disorders: Secondary | ICD-10-CM | POA: Diagnosis not present

## 2024-06-22 NOTE — Plan of Care (Signed)

## 2024-06-22 NOTE — Group Note (Signed)
 Date:  06/22/2024 Time:  11:38 PM  Group Topic/Focus:  Self Care:   The focus of this group is to help patients understand the importance of self-care in order to improve or restore emotional, physical, spiritual, interpersonal, and financial health.    Participation Level:  Active  Participation Quality:  Appropriate  Affect:  Appropriate  Cognitive:  Appropriate  Insight: Good  Engagement in Group:  Engaged  Modes of Intervention:  Discussion  Additional Comments:    Benjamin [name] 06/22/2024, 11:38 PM

## 2024-06-22 NOTE — Progress Notes (Signed)
 [facility] MD Progress Note  06/22/2024 11:37 AM Nina Hoar  MRN:  [id number] Leeroy Lovings is a 78 year old male who presents to the inpatient geriatric psych unit after jumping from a two story building. Patient was originally seen at [facility] who referred him to the ED who admitted him here on 03/27/2024. Patient reports that people from the [place] gang were trying to kill him and the only escape was through the bedroom window on the second floor. He currently lives in the house with [name] and his family and has been living with them for 3-4 years without any problems. On 03/19/2024 he went to the doctor and when he got back home the family wouldn't let him leave. He states that after he realized the gang was going to kill him he barricaded the door with his bed and jumped out the window. When he landed he ran away until one of his neighbors found him and called 911.   Subjective: Chart is reviewed and discussed with the treatment team.  APS is working on legal guardianship and placement.  He has guardianship hearing June 19, 2024  Patient is noted to be resting in bed.  He offers no complaints.  Per nursing patient remains compliant with his medications with no reported side effects.  Patient denies SI/HI/plan and denies auditory/visual hallucinations.  With prompting and help patient is able to take care of her ADLs Sleep: Fair  Appetite:  Fair  Past Psychiatric History: see h&P  Family History:  Family History  Problem Relation Age of Onset   Colon cancer Neg Hx    Colon polyps Neg Hx    Stomach cancer Neg Hx    Esophageal cancer Neg Hx    Social History:  Social History   Substance and Sexual Activity  Alcohol Use No     Social History   Substance and Sexual Activity  Drug Use No    Social History   Socioeconomic History   Marital status: Single    Spouse name: Not on file   Number of children: Not on file   Years of education: Not on file    Highest education level: Not on file  Occupational History   Not on file  Tobacco Use   Smoking status: Former    Types: Cigarettes   Smokeless tobacco: Never  Vaping Use   Vaping status: Never Used  Substance and Sexual Activity   Alcohol use: No   Drug use: No   Sexual activity: Not on file  Other Topics Concern   Not on file  Social History Narrative   Not on file   Social Drivers of Health   Financial Resource Strain: Not on file  Food Insecurity: No Food Insecurity (03/27/2024)   Hunger Vital Sign    Worried About Running Out of Food in the Last Year: Never true    Ran Out of Food in the Last Year: Never true  Transportation Needs: No Transportation Needs (03/27/2024)   PRAPARE - Administrator, Civil Service (Medical): No    Lack of Transportation (Non-Medical): No  Physical Activity: Not on file  Stress: Not on file  Social Connections: Moderately Integrated (03/27/2024)   Social Connection and Isolation Panel    Frequency of Communication with Friends and Family: Twice a week    Frequency of Social Gatherings with Friends and Family: Once a week    Attends Religious Services: 1 to 4 times per year    Active  Member of Clubs or Organizations: No    Attends Engineer, Structural: 1 to 4 times per year    Marital Status: Divorced   Past Medical History:  Past Medical History:  Diagnosis Date   Allergy    Arthritis    back   Prostate cancer (HCC) dx'd 2010   surg only    Past Surgical History:  Procedure Laterality Date   COLONOSCOPY     EYE SURGERY     PROSTATE SURGERY      Current Medications: Current Facility-Administered Medications  Medication Dose Route Frequency Provider Last Rate Last Admin   acetaminophen  (TYLENOL ) tablet 650 mg  650 mg Oral Q6H PRN Coleman, Carolyn H, NP       alum & mag hydroxide-simeth (MAALOX/MYLANTA) 200-200-20 MG/5ML suspension 30 mL  30 mL Oral Q4H PRN Coleman, Carolyn H, NP       docusate sodium  (COLACE)  capsule 100 mg  100 mg Oral Daily Arian Mcquitty, MD   100 mg at 06/22/24 9077   fluPHENAZine  (PROLIXIN ) tablet 10 mg  10 mg Oral BID Shrivastava, Aryendra, MD   10 mg at 06/22/24 9078   Or   fluPHENAZine  (PROLIXIN ) injection 5 mg  5 mg Intramuscular BID Shrivastava, Aryendra, MD       magnesium  hydroxide (MILK OF MAGNESIA) suspension 30 mL  30 mL Oral Daily PRN Coleman, Carolyn H, NP       OLANZapine  (ZYPREXA ) injection 5 mg  5 mg Intramuscular TID PRN Mardy Elveria DEL, NP   5 mg at 04/12/24 2230   OLANZapine  zydis (ZYPREXA ) disintegrating tablet 5 mg  5 mg Oral TID PRN Coleman, Carolyn H, NP       polyethylene glycol (MIRALAX  / GLYCOLAX ) packet 17 g  17 g Oral Daily PRN Donnelly Mellow, MD   17 g at 06/04/24 2123    Lab Results:  No results found for this or any previous visit (from the past 48 hours).    Blood Alcohol level:  Lab Results  Component Value Date   [facility] <15 03/26/2024    Metabolic Disorder Labs: Lab Results  Component Value Date   HGBA1C 5.6 04/02/2024   MPG 114 04/02/2024   No results found for: PROLACTIN Lab Results  Component Value Date   CHOL 146 04/02/2024   TRIG 37 04/02/2024   HDL 43 04/02/2024   CHOLHDL 3.4 04/02/2024   VLDL 7 04/02/2024   LDLCALC 96 04/02/2024    Physical Findings: AIMS:  , ,  ,  ,    CIWA:    COWS:      Psychiatric Specialty Exam:  Presentation  General Appearance:  Appropriate for Environment; Bizarre  Eye Contact: Fair  Speech: Clear and Coherent  Speech Volume: Normal    Mood and Affect  Mood: Fine Affect: Congruent   Thought Process  Thought Processes: Disorganized  Descriptions of Associations:Intact  Orientation:Full (Time, Place and Person)  Thought Content: Paranoia at baseline Hallucinations: Denies  Ideas of Reference: Paranoia at baseline Suicidal Thoughts: Denies  Homicidal Thoughts: Denies   Sensorium   Memory: impiared  Judgment: Impaired  Insight: Shallow   Executive Functions  Concentration: Fair  Attention Span: Fair  Recall: [employer] of Knowledge: Fair  Language: Fair   Psychomotor Activity  Psychomotor Activity: No data recorded  Musculoskeletal: Strength & Muscle Tone: within normal limits Gait & Station: normal Assets  Assets: Manufacturing Systems Engineer; Resilience    Physical Exam: Physical Exam Vitals and nursing note reviewed.  ROS Blood pressure (!) 131/93, pulse 70, temperature (!) 97.5 F (36.4 C), resp. rate 18, height 6' (1.829 m), weight 100 kg, SpO2 99%. Body mass index is 29.91 kg/m.  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  Patient needs to be monitored closely for ongoing psychosis and paranoid delusions.  Given patient's confusion and chronic paranoia, no family support, APS report has been made and it got screened in for further evaluation by APS.   Differential diagnosis include: unspecified psychosis, delusion disorder, dementia, schizophrenia/schizoaffective disorder   Treatment Plan Summary: Requested the social work team to reach out to APS for safety concerns in the community as patient has chronic paranoia and confusion and is currently homeless patient remains confused and paranoid and spite of being compliant with medications.  Patient displays cognitive delay and deficits in executive function like planning, organizing and processing information.  At this point patient lacks capacity to make medical decisions.  We recommend APS referral for possible legal guardianship as he has no family or legal next of kin to help with decision-making.   Safety and Monitoring:              -- InVoluntary admission to inpatient psychiatric unit for safety, stabilization and treatment             -- Daily contact with patient to assess and evaluate symptoms and progress in treatment             -- Patient's case to be discussed in multi-disciplinary team meeting             -- Observation Level: q15 minute checks             -- Vital signs:  q12 hours             -- Precautions: suicide, elopement, and assault   2. Psychiatric Diagnoses and Treatment:              Continue Prolixin  to 10 mg twice daily. discontinued Zyprexa .  -- The risks/benefits/side-effects/alternatives to this medication were discussed in detail with the patient and time was given for questions. The patient consents to medication trial.                -- Metabolic profile and EKG monitoring obtained while on an atypical antipsychotic (BMI: Lipid Panel: HbgA1c: QTc:)              -- Encouraged patient to participate in unit milieu and in scheduled group therapies                            3. Medical Issues Being Addressed:    No urgent medical needs noted  4. Discharge Planning:   -- Social work and case management to assist with discharge planning and identification of hospital follow-up needs prior to discharge  -- Estimated LOS: 3-4 days  Allyn Foil, MD 06/22/2024, 11:37 AM

## 2024-06-22 NOTE — Plan of Care (Signed)
  Problem: Clinical Measurements: Goal: Diagnostic test results will improve Outcome: Progressing Goal: Cardiovascular complication will be avoided Outcome: Progressing   

## 2024-06-22 NOTE — Progress Notes (Signed)
   06/21/24 2100  Psych Admission Type (Psych Patients Only)  Admission Status Voluntary  Psychosocial Assessment  Patient Complaints None  Eye Contact Darting  Facial Expression Flat  Affect Flat  Speech Logical/coherent  Interaction Assertive  Motor Activity Slow  Appearance/Hygiene In scrubs  Behavior Characteristics Appropriate to situation  Mood Pleasant  Thought Process  Coherency WDL  Content WDL  Delusions None reported or observed  Perception WDL  Hallucination None reported or observed  Judgment Impaired  Confusion Mild  Danger to Self  Current suicidal ideation? Denies

## 2024-06-22 NOTE — Group Note (Signed)
 Recreation Therapy Group Note   Group Topic:Other  Group Date: 06/22/2024 Start Time: 1400 End Time: 1500 Facilitators: Celestia Jeoffrey BRAVO, LRT, CTRS Location: Dayroom and Hallway  Group Description: Bag Decoration and Trick or Treating.  Patients received a brown paper bag to decorate for Halloween. Once complete, patients walked down the hall, with Halloween music playing in the background, to go trick or treating with LRT. Patients received candy with RN approval.   Goal Area(s) Addressed:  Patients will express themselves through art Patients will recall past holiday memories  Patients will increase communication    Affect/Mood: N/A   Participation Level: Did not attend    Clinical Observations/Individualized Feedback: Patient did not attend group.   Plan: Continue to engage patient in RT group sessions 2-3x/week.   Jeoffrey BRAVO Celestia, LRT, CTRS 06/22/2024 3:16 PM

## 2024-06-22 NOTE — Group Note (Signed)
 Physical/Occupational Therapy Group Note  Group Topic: Pain Management and Coping   Group Date: 06/22/2024 Start Time: 1305 End Time: 1405 Facilitators: Clive Warren CROME, OT   Group Description:  Group discussed impact of chronic/acute pain on safety and independence with functional tasks and impact on mental health.  Identified and discussed any previously learned or implemented strategies used.  Discussed and reviewed cognitive behavioral pain coping strategies to address/improve overall management of pain. Discussed relaxation, distraction techniques, cognitive restructuring, activity pacing/energy conservation, environment/home safety modifications, and role of sleep and sleep hygiene. Allowed time for questions and further discussion.      Therapeutic Goal(s):   Identify and discuss previously utilized pain coping strategies and implications of pain on function/well-being Identify and discuss implementing new cognitive behavioral pain coping strategies into daily routines Demonstrate understanding and performance of learned cognitive behavioral pain coping strategies.    Individual Participation: Pt present for duration of session. Pt engaged in group discussion and answer questions with prompts. Tendency to keep eyes open and nod but required cues to improve alertness/attention to session. Pt participated in the progressive relaxation activity.   Participation Level: Moderate   Participation Quality: Minimal Cues   Behavior: Calm and Lethargic   Speech/Thought Process: Loose association    Affect/Mood: Flat   Insight: Poor   Judgement: Poor   Modes of Intervention: Activity, Clarification, Discussion, Education, Exploration, Problem-solving, Rapport Building, Socialization, and Support  Patient Response to Interventions:  Disengaged and Receptive   Plan: Continue to engage patient in PT/OT groups 1 - 2x/week.  Peniel Biel R., MPH, MS, OTR/L ascom [phone] 06/22/24, 4:38  PM

## 2024-06-22 NOTE — Progress Notes (Signed)
 SI/HI/AVH: denies all   Behavior/Mood: appropriate/pleasant     Interaction/Group attendance: assertive/1 of 3 groups    Medication/PRNs: compliant/ none   Pain: denies   Other: n/a

## 2024-06-22 NOTE — Group Note (Signed)
 Date:  06/22/2024 Time:  10:20 AM  Group Topic/Focus:  Movement Therapy    Participation Level:  Did Not Attend  [name] 06/22/2024, 10:20 AM

## 2024-06-23 DIAGNOSIS — F0390 Unspecified dementia without behavioral disturbance: Secondary | ICD-10-CM | POA: Diagnosis not present

## 2024-06-23 DIAGNOSIS — F22 Delusional disorders: Secondary | ICD-10-CM | POA: Diagnosis not present

## 2024-06-23 NOTE — Group Note (Signed)
 Date:  06/23/2024 Time:  10:35 AM  Group Topic/Focus:  Coping With Mental Health Crisis:   The purpose of this group is to help patients identify strategies for coping with mental health crisis.  Group discusses possible causes of crisis and ways to manage them effectively.    Participation Level:  Did Not Attend   Leigh VEAR Pais 06/23/2024, 10:35 AM

## 2024-06-23 NOTE — Plan of Care (Signed)
  Problem: Education: Goal: Knowledge of General Education information will improve Description: Including pain rating scale, medication(s)/side effects and non-pharmacologic comfort measures 06/23/2024 0506 by Ezzard Stank, RN Outcome: Progressing 06/23/2024 0505 by Ezzard Stank, RN Outcome: Progressing   Problem: Health Behavior/Discharge Planning: Goal: Ability to manage health-related needs will improve 06/23/2024 0506 by Ezzard Stank, RN Outcome: Progressing 06/23/2024 0505 by Ezzard Stank, RN Outcome: Progressing   Problem: Clinical Measurements: Goal: Ability to maintain clinical measurements within normal limits will improve Outcome: Progressing   Problem: Nutrition: Goal: Adequate nutrition will be maintained Outcome: Progressing   Problem: Coping: Goal: Level of anxiety will decrease Outcome: Progressing   Problem: Elimination: Goal: Will not experience complications related to bowel motility Outcome: Progressing Goal: Will not experience complications related to urinary retention Outcome: Progressing

## 2024-06-23 NOTE — Group Note (Signed)
 Date:  06/23/2024 Time:  8:40 PM  Group Topic/Focus:  Rediscovering Joy:   The focus of this group is to explore various ways to relieve stress in a positive manner.    Participation Level:  Active  Participation Quality:  Appropriate  Affect:  Appropriate  Cognitive:  Appropriate  Insight: Good  Engagement in Group:  Engaged  Modes of Intervention:  Discussion  Additional Comments:   Benjamin [name] 06/23/2024, 8:40 PM

## 2024-06-23 NOTE — Progress Notes (Signed)
 [facility] MD Progress Note  06/23/2024 9:45 PM Benjamin [name]  MRN:  [id number] Benjamin [name] is a 78 year old male who presents to the inpatient geriatric psych unit after jumping from a two story building. Patient was originally seen at [facility] who referred him to the ED who admitted him here on 03/27/2024. Patient reports that people from the [place] gang were trying to kill him and the only escape was through the bedroom window on the second floor. He currently lives in the house with [name] and his family and has been living with them for 3-4 years without any problems. On 03/19/2024 he went to the doctor and when he got back home the family wouldn't let him leave. He states that after he realized the gang was going to kill him he barricaded the door with his bed and jumped out the window. When he landed he ran away until one of his neighbors found him and called 911.   06/23/2024: Patient is resting in bed and offers no concerns today. He continues to be compliant with his treatment plan. He denies SI, HI, AVH at this time. He has been attending to hygiene needs according to staff. Is out of room for meals. Has not required PRN medications in the past 24 hours.   Subjective: Chart is reviewed and discussed with the treatment team.  APS is working on legal guardianship and placement.  He has guardianship hearing June 19, 2024  Patient is noted to be resting in bed.  He offers no complaints.  Per nursing patient remains compliant with his medications with no reported side effects.  Patient denies SI/HI/plan and denies auditory/visual hallucinations.  With prompting and help patient is able to take care of her ADLs Sleep: Fair  Appetite:  Fair  Past Psychiatric History: see h&P  Family History:  Family History  Problem Relation Age of Onset   Colon cancer Neg Hx    Colon polyps Neg Hx    Stomach cancer Neg Hx    Esophageal cancer Neg Hx    Social History:  Social  History   Substance and Sexual Activity  Alcohol Use No     Social History   Substance and Sexual Activity  Drug Use No    Social History   Socioeconomic History   Marital status: Single    Spouse name: Not on file   Number of children: Not on file   Years of education: Not on file   Highest education level: Not on file  Occupational History   Not on file  Tobacco Use   Smoking status: Former    Types: Cigarettes   Smokeless tobacco: Never  Vaping Use   Vaping status: Never Used  Substance and Sexual Activity   Alcohol use: No   Drug use: No   Sexual activity: Not on file  Other Topics Concern   Not on file  Social History Narrative   Not on file   Social Drivers of Health   Financial Resource Strain: Not on file  Food Insecurity: No Food Insecurity (03/27/2024)   Hunger Vital Sign    Worried About Running Out of Food in the Last Year: Never true    Ran Out of Food in the Last Year: Never true  Transportation Needs: No Transportation Needs (03/27/2024)   PRAPARE - Administrator, Civil Service (Medical): No    Lack of Transportation (Non-Medical): No  Physical Activity: Not on file  Stress: Not on file  Social Connections: Moderately Integrated (03/27/2024)   Social Connection and Isolation Panel    Frequency of Communication with Friends and Family: Twice a week    Frequency of Social Gatherings with Friends and Family: Once a week    Attends Religious Services: 1 to 4 times per year    Active Member of [employer] or Organizations: No    Attends Engineer, Structural: 1 to 4 times per year    Marital Status: Divorced   Past Medical History:  Past Medical History:  Diagnosis Date   Allergy    Arthritis    back   Prostate cancer (HCC) dx'd 2010   surg only    Past Surgical History:  Procedure Laterality Date   COLONOSCOPY     EYE SURGERY     PROSTATE SURGERY      Current Medications: Current Facility-Administered Medications   Medication Dose Route Frequency Provider Last Rate Last Admin   acetaminophen  (TYLENOL ) tablet 650 mg  650 mg Oral Q6H PRN Coleman, Carolyn H, NP       alum & mag hydroxide-simeth (MAALOX/MYLANTA) 200-200-20 MG/5ML suspension 30 mL  30 mL Oral Q4H PRN Coleman, Carolyn H, NP       docusate sodium  (COLACE) capsule 100 mg  100 mg Oral Daily Jadapalle, Sree, MD   100 mg at 06/23/24 1018   fluPHENAZine  (PROLIXIN ) tablet 10 mg  10 mg Oral BID Shrivastava, Aryendra, MD   10 mg at 06/23/24 2102   Or   fluPHENAZine  (PROLIXIN ) injection 5 mg  5 mg Intramuscular BID Shrivastava, Aryendra, MD       magnesium  hydroxide (MILK OF MAGNESIA) suspension 30 mL  30 mL Oral Daily PRN Mardy Elveria DEL, NP       OLANZapine  (ZYPREXA ) injection 5 mg  5 mg Intramuscular TID PRN Mardy Elveria DEL, NP   5 mg at 04/12/24 2230   OLANZapine  zydis (ZYPREXA ) disintegrating tablet 5 mg  5 mg Oral TID PRN Mardy Elveria DEL, NP       polyethylene glycol (MIRALAX  / GLYCOLAX ) packet 17 g  17 g Oral Daily PRN Donnelly Mellow, MD   17 g at 06/04/24 2123    Lab Results:  No results found for this or any previous visit (from the past 48 hours).    Blood Alcohol level:  Lab Results  Component Value Date   [facility] <15 03/26/2024    Metabolic Disorder Labs: Lab Results  Component Value Date   HGBA1C 5.6 04/02/2024   MPG 114 04/02/2024   No results found for: PROLACTIN Lab Results  Component Value Date   CHOL 146 04/02/2024   TRIG 37 04/02/2024   HDL 43 04/02/2024   CHOLHDL 3.4 04/02/2024   VLDL 7 04/02/2024   LDLCALC 96 04/02/2024    Physical Findings: AIMS:  , ,  ,  ,    CIWA:    COWS:      Psychiatric Specialty Exam:  Presentation  General Appearance:  Appropriate for Environment; Bizarre  Eye Contact: Fair  Speech: Clear and Coherent  Speech Volume: Normal    Mood and Affect  Mood: Fine Affect: Congruent   Thought Process  Thought Processes: Disorganized  Descriptions of  Associations:Intact  Orientation:Full (Time, Place and Person)  Thought Content: Paranoia at baseline Hallucinations: Denies  Ideas of Reference: Paranoia at baseline Suicidal Thoughts: Denies  Homicidal Thoughts: Denies   Sensorium  Memory: impiared  Judgment: Impaired  Insight: Shallow   Executive Functions  Concentration: Fair  Attention  Span: Fair  Recall: [employer] of Knowledge: Fair  Language: Fair   Psychomotor Activity  Psychomotor Activity: No data recorded  Musculoskeletal: Strength & Muscle Tone: within normal limits Gait & Station: normal Assets  Assets: Manufacturing Systems Engineer; Resilience    Physical Exam: Physical Exam Vitals and nursing note reviewed.    ROS Blood pressure 108/72, pulse 81, temperature 97.6 F (36.4 C), resp. rate 18, height 6' (1.829 m), weight 100 kg, SpO2 100%. Body mass index is 29.91 kg/m.  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  Patient needs to be monitored closely for ongoing psychosis and paranoid delusions.  Given patient's confusion and chronic paranoia, no family support, APS report has been made and it got screened in for further evaluation by APS.   Differential diagnosis include: unspecified psychosis, delusion disorder, dementia, schizophrenia/schizoaffective disorder   Treatment Plan Summary: Requested the social work team to reach out to APS for safety concerns in the community as patient has chronic paranoia and confusion and is currently homeless patient remains confused and paranoid and spite of being compliant with medications.  Patient displays cognitive delay and deficits in executive function like planning, organizing and processing  information.  At this point patient lacks capacity to make medical decisions.  We recommend APS referral for possible legal guardianship as he has no family or legal next of kin to help with decision-making.   Safety and Monitoring:             -- InVoluntary admission to inpatient psychiatric unit for safety, stabilization and treatment             -- Daily contact with patient to assess and evaluate symptoms and progress in treatment             -- Patient's case to be discussed in multi-disciplinary team meeting             -- Observation Level: q15 minute checks             -- Vital signs:  q12 hours             -- Precautions: suicide, elopement, and assault   2. Psychiatric Diagnoses and Treatment:              Continue Prolixin  to 10 mg twice daily. discontinued Zyprexa .  -- The risks/benefits/side-effects/alternatives to this medication were discussed in detail with the patient and time was given for questions. The patient consents to medication trial.                -- Metabolic profile and EKG monitoring obtained while on an atypical antipsychotic (BMI: Lipid Panel: HbgA1c: QTc:)              -- Encouraged patient to participate in unit milieu and in scheduled group therapies                            3. Medical Issues Being Addressed:    No urgent medical needs noted  4. Discharge Planning:   -- Social work and case management to assist with discharge planning and identification of hospital follow-up needs prior to discharge  -- Estimated LOS: 3-4 days  Daine KATHEE Ober,  NP 06/23/2024, 9:45 PM

## 2024-06-23 NOTE — BH IP Treatment Plan (Signed)
 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[facility])  Secondary Diagnoses: Principal Problem:   Delusional disorder (HCC)   Current Medications:  Current Facility-Administered Medications  Medication Dose Route Frequency Provider Last Rate Last Admin   acetaminophen  (TYLENOL ) tablet 650 mg  650 mg Oral Q6H PRN Coleman, Carolyn H, NP       alum & mag hydroxide-simeth (MAALOX/MYLANTA) 200-200-20 MG/5ML suspension 30 mL  30 mL Oral Q4H PRN Coleman, Carolyn H, NP       docusate sodium  (COLACE) capsule 100 mg  100 mg Oral Daily Jadapalle, Sree, MD   100 mg at 06/23/24 1018   fluPHENAZine  (PROLIXIN ) tablet 10 mg  10 mg Oral BID Shrivastava, Aryendra, MD   10 mg at 06/23/24 9155   Or   fluPHENAZine  (PROLIXIN ) injection 5 mg  5 mg Intramuscular BID Shrivastava, Aryendra, MD       magnesium  hydroxide (MILK OF MAGNESIA) suspension 30 mL  30 mL Oral Daily PRN Coleman, Carolyn H, NP       OLANZapine  (ZYPREXA ) injection 5 mg  5 mg Intramuscular TID PRN Mardy Elveria DEL, NP   5 mg at 04/12/24 2230   OLANZapine  zydis (ZYPREXA ) disintegrating tablet 5 mg  5 mg Oral TID PRN Coleman, Carolyn H, NP       polyethylene glycol (MIRALAX  / GLYCOLAX ) packet 17 g  17 g Oral Daily PRN Jadapalle, Sree, MD   17 g at 06/04/24 2123   PTA Medications: Medications Prior to Admission  Medication Sig Dispense Refill Last Dose/Taking   docusate sodium  (COLACE) 100 MG capsule Take 100 mg by mouth daily.      omega-3 acid ethyl esters (LOVAZA) 1 g capsule Take 1 g by mouth daily.       Patient Stressors: Traumatic event    Patient Strengths: Communication skills   Treatment Modalities: Medication Management, Group therapy, Case management,  1 to 1 session with clinician, Psychoeducation, Recreational therapy.   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  Therapeutic Interventions: 1 to 1 sessions, Unit Group sessions and Medication administration.  Evaluation of Outcomes: Progressing  Physician Treatment Plan for Secondary Diagnosis: Principal Problem:   Delusional disorder (HCC)  Long Term Goal(s): Improvement in symptoms so as ready for discharge   Short Term Goals: Ability to identify changes in lifestyle to reduce recurrence of condition will improve Ability to verbalize feelings will improve Ability to disclose and discuss suicidal ideas Ability to demonstrate self-control will improve Ability to identify and develop effective coping behaviors will improve     Medication Management: Evaluate patient's response, side effects, and tolerance of medication regimen.  Therapeutic Interventions: 1 to 1 sessions, Unit Group sessions and Medication administration.  Evaluation of Outcomes: Progressing   RN Treatment Plan for Primary Diagnosis: Delusional disorder (HCC) Long Term Goal(s): Knowledge of disease and therapeutic regimen to maintain health will improve  Short Term Goals: Ability to remain free from injury will improve, Ability to verbalize frustration and anger appropriately  will improve, Ability to demonstrate self-control, Ability to participate in decision making will improve, Ability to verbalize feelings will improve, Ability to disclose and discuss suicidal ideas, Ability to identify and develop effective coping behaviors will improve, and  Compliance with prescribed medications will improve  Medication Management: RN will administer medications as ordered by provider, will assess and evaluate patient's response and provide education to patient for prescribed medication. RN will report any adverse and/or side effects to prescribing provider.  Therapeutic Interventions: 1 on 1 counseling sessions, Psychoeducation, Medication administration, Evaluate responses to treatment, Monitor vital signs and CBGs as ordered, Perform/monitor CIWA, COWS, AIMS and Fall Risk screenings as ordered, Perform wound care treatments as ordered.  Evaluation of Outcomes: Progressing   LCSW Treatment Plan for Primary Diagnosis: Delusional disorder [facility]) Long Term Goal(s): Safe transition to appropriate next level of care at discharge, Engage patient in therapeutic group addressing interpersonal concerns.  Short Term Goals: Engage patient in aftercare planning with referrals and resources, Increase social support, Increase ability to appropriately verbalize feelings, Increase emotional regulation, Facilitate acceptance of mental health diagnosis and concerns, Facilitate patient progression through stages of change regarding substance use diagnoses and concerns, Identify triggers associated with mental health/substance abuse issues, and Increase skills for wellness and recovery  Therapeutic Interventions: Assess for all discharge needs, 1 to 1 time with Social worker, Explore available resources and support systems, Assess for adequacy in community support network, Educate family and significant other(s) on suicide prevention, Complete Psychosocial Assessment, Interpersonal group therapy.  Evaluation of Outcomes: Progressing  Progress in Treatment: Attending groups: Yes. and No. Participating in groups: Yes. and No. Taking medication as prescribed: Yes. Toleration medication: Yes. Family/Significant other contact made: Yes, individual(s) contacted:  [name], borther, [phone]  Patient understands diagnosis: No. Discussing patient identified problems/goals with staff: Yes. Medical problems stabilized or resolved: Yes. Denies suicidal/homicidal ideation: Yes. Issues/concerns per patient self-inventory: No. Other: None   New problem(s) identified: No, Describe:  05/14/24 Update: None  05/24/24 Update: No changes at this time.  Update 05/29/2024:  No changes at this time.  Update 06/03/2024:  No changes at this time. Update 06/08/24: No changes at this time Update 06/13/24: No changes at this time  Update 06/18/24: No changes at this time Update 06/23/24: No changes at this time        New Short Term/Long Term Goal(s): elimination of symptoms of psychosis, medication management for mood stabilization; elimination of SI thoughts; development of comprehensive mental wellness plan. Update 04/02/24: No changes at this time. Update 04/07/24: No changes at this time. Update 04/12/24: No changes at this time Update 04/18/24: No changes at this time  Update 04/23/24: No changes at this time. Update 04/28/2024: No changes at this time.  Update 05/04/2024: No changes at this time.  Update 05/09/2024:  No changes at this time. 05/14/24 Update: No changes at this time. 05/19/24 Update: No changes at this time. 05/24/24 Update: No changes at this time. Update 05/29/2024:  No changes at this time. Update 06/03/2024:  No changes at this time. Update 06/08/24: No changes at this time Update 06/13/24: No changes at this time Update 06/18/24: No changes at this time Update 06/23/24: No changes at this time    Patient Goals:  Get out of here, I want to get healed up so I can get out of here Update 04/02/24: No changes at this time. Update 04/07/24: No changes at this time .Update 04/12/24: No changes at this time Update 04/18/24: No changes  at this time  Update 04/23/24: No changes at this time. Update 04/28/2024: No changes at this time. Update 05/04/2024: No changes at this time.    Update 05/29/2024:  No changes at this time.  Update 06/03/2024:  No changes at this time. Update 06/08/24: No changes at this time Update 06/13/24: No changes at this time Update 06/18/24: No changes at this time Update 06/23/24: No changes at this time    Discharge Plan or Barriers: CSW will assist with appropriate discharge planning  Update 04/02/24: No changes at this time. Update 04/07/24: No changes at this time.Update 04/12/24: No changes at this time Update 04/18/24: CSW submitted report to APS. Care team looking at guardianship for pt at this time  Update 04/23/24: No changes at this time. Update 04/28/2024: No changes at this time. Update 05/04/2024: No changes at this time.  Update 05/09/2024:  [facility] APS continues to search for placement. 05/14/24 Update: [facility] APS continues to look for placement. CSW to continue to assess. 05/19/24 Update: No changes at this time. 05/24/24 Update:DSS continues to look for placement at this time Update 05/29/2024:  Patient remains on the unit and safe at this time.  Case was accepted by APS [facility] and they are looking for placement for the patient.   Update 06/03/2024:  No changes at this time. Update 06/08/24: No changes at this time Update 06/13/24: [facility] APS having difficulty connecting with pt's care coordinator at [facility] to assist with placement Update 06/18/24: No changes at this time Update 06/23/24: [facility] reports that she may have found placement for pt. CSW will send TB tests per her request   Reason for Continuation of Hospitalization: Delusions  Medication stabilization   Estimated Length of Stay: 1 to 7 days Update 04/02/24: TBD. Update 04/07/24: TBD Update 04/12/24:TBD. Update 04/28/24:TBD Update 05/04/2024: TBD Update 05/09/2024:  TBD  05/14/24 Update: TBD. 05/19/24 Update: TBD. 05/24/24 Update: TBD  Update 05/29/2024:  TBD.  Update 06/03/2024:  TBD Update 06/08/24: TBD Update 06/13/24: TBD Update 06/18/24: TBD Update  06/23/24: TBD Last 3 [place] Suicide Severity Risk Score: Flowsheet Row Admission (Current) from 03/27/2024 in [name] from 03/26/2024 in [facility] Emergency Department at [facility] ED from 03/25/2024 in [facility]  C-SSRS RISK CATEGORY No Risk No Risk No Risk    Last [facility] 2/9 Scores:     No data to display          Scribe for Treatment Team: [name], ISRA[place] 06/23/2024 2:08 PM

## 2024-06-23 NOTE — Progress Notes (Signed)
   06/23/24 1400  Psych Admission Type (Psych Patients Only)  Admission Status Voluntary  Psychosocial Assessment  Patient Complaints None  Eye Contact Darting  Facial Expression Flat  Affect Flat  Speech Logical/coherent  Interaction Assertive  Motor Activity Slow  Appearance/Hygiene In scrubs  Behavior Characteristics Appropriate to situation  Mood Pleasant  Thought Process  Coherency WDL  Content WDL  Delusions None reported or observed  Perception WDL  Hallucination None reported or observed  Judgment Impaired  Confusion Mild  Danger to Self  Current suicidal ideation? Denies  Self-Injurious Behavior No self-injurious ideation or behavior indicators observed or expressed   Agreement Not to Harm Self Yes  Description of Agreement Verbal  Danger to Others  Danger to Others None reported or observed

## 2024-06-23 NOTE — Group Note (Signed)
 Date:  06/23/2024 Time:  4:07 PM  Group Topic/Focus:  Leisure/Bingo Group: The purpose of this group is to allow patients to participate in a fun activity with peers and interact with each other. [                                                                                                                                Participation Level:  Did Not Attend  Beatris ONEIDA Hasten 06/23/2024, 4:07 PM

## 2024-06-23 NOTE — Plan of Care (Signed)
   Problem: Coping: Goal: Level of anxiety will decrease Outcome: Progressing

## 2024-06-23 NOTE — Progress Notes (Signed)
 SI/HI/AVH: denies  Behavior/Mood: appropriate/pleasant and cooperative  Interaction/Group attendance: isolative/did not attend group  Medication/PRNs: po med compliant/none  Pain: denies  Other: Slept 10.25 hours

## 2024-06-24 DIAGNOSIS — F0390 Unspecified dementia without behavioral disturbance: Secondary | ICD-10-CM | POA: Diagnosis not present

## 2024-06-24 DIAGNOSIS — F22 Delusional disorders: Secondary | ICD-10-CM | POA: Diagnosis not present

## 2024-06-24 NOTE — Progress Notes (Signed)
 [facility] MD Progress Note  06/24/2024 2:19 PM Benjamin [name]  MRN:  [id number] Benjamin [name] is a 78 year old male who presents to the inpatient geriatric psych unit after jumping from a two story building. Patient was originally seen at [name] who referred him to the ED who admitted him here on 03/27/2024. Patient reports that people from the [place] gang were trying to kill him and the only escape was through the bedroom window on the second floor. He currently lives in the house with [name] and his family and has been living with them for 3-4 years without any problems. On 03/19/2024 he went to the doctor and when he got back home the family wouldn't let him leave. He states that after he realized the gang was going to kill him he barricaded the door with his bed and jumped out the window. When he landed he ran away until one of his neighbors found him and called 911.   06/24/2024: Subjective: Chart is reviewed and discussed with the treatment team.  Patient is again sitting in his room on the bed in no acute distress.  It appears that he is only out of his room for meals.  Continues to have some disorganized thoughts and delusional beliefs that he is being tracked by his land.  He has concerns about housing and frequently discusses that he has had 18 years of no sexual sins and hopes that this is not a deterrent for his ability to gain housing.  He denies depression, anxiety, mood instability.  He also denies AVH.  He continues to be compliant with medications per nursing and patient and offers no other complaints today.  He is attending to ADLs with prompting.  06/23/2024: Patient is resting in bed and offers no concerns today. He continues to be compliant with his treatment plan. He denies SI, HI, AVH at this time. He has been attending to hygiene needs according to staff. Is out of room for meals. Has not required PRN medications in the past 24 hours.   Subjective: Chart is  reviewed and discussed with the treatment team.  APS is working on legal guardianship and placement.  He has guardianship hearing June 19, 2024  Patient is noted to be resting in bed.  He offers no complaints.  Per nursing patient remains compliant with his medications with no reported side effects.  Patient denies SI/HI/plan and denies auditory/visual hallucinations.  With prompting and help patient is able to take care of her ADLs Sleep: Fair  Appetite:  Fair  Past Psychiatric History: see h&P  Family History:  Family History  Problem Relation Age of Onset   Colon cancer Neg Hx    Colon polyps Neg Hx    Stomach cancer Neg Hx    Esophageal cancer Neg Hx    Social History:  Social History   Substance and Sexual Activity  Alcohol Use No     Social History   Substance and Sexual Activity  Drug Use No    Social History   Socioeconomic History   Marital status: Single    Spouse name: Not on file   Number of children: Not on file   Years of education: Not on file   Highest education level: Not on file  Occupational History   Not on file  Tobacco Use   Smoking status: Former    Types: Cigarettes   Smokeless tobacco: Never  Vaping Use   Vaping status: Never Used  Substance and Sexual  Activity   Alcohol use: No   Drug use: No   Sexual activity: Not on file  Other Topics Concern   Not on file  Social History Narrative   Not on file   Social Drivers of Health   Financial Resource Strain: Not on file  Food Insecurity: No Food Insecurity (03/27/2024)   Hunger Vital Sign    Worried About Running Out of Food in the Last Year: Never true    Ran Out of Food in the Last Year: Never true  Transportation Needs: No Transportation Needs (03/27/2024)   PRAPARE - Administrator, Civil Service (Medical): No    Lack of Transportation (Non-Medical): No  Physical Activity: Not on file  Stress: Not on file  Social Connections: Moderately Integrated (03/27/2024)   Social  Connection and Isolation Panel    Frequency of Communication with Friends and Family: Twice a week    Frequency of Social Gatherings with Friends and Family: Once a week    Attends Religious Services: 1 to 4 times per year    Active Member of [employer] or Organizations: No    Attends Engineer, Structural: 1 to 4 times per year    Marital Status: Divorced   Past Medical History:  Past Medical History:  Diagnosis Date   Allergy    Arthritis    back   Prostate cancer (HCC) dx'd 2010   surg only    Past Surgical History:  Procedure Laterality Date   COLONOSCOPY     EYE SURGERY     PROSTATE SURGERY      Current Medications: Current Facility-Administered Medications  Medication Dose Route Frequency Provider Last Rate Last Admin   acetaminophen  (TYLENOL ) tablet 650 mg  650 mg Oral Q6H PRN Coleman, Carolyn H, NP       alum & mag hydroxide-simeth (MAALOX/MYLANTA) 200-200-20 MG/5ML suspension 30 mL  30 mL Oral Q4H PRN Coleman, Carolyn H, NP       docusate sodium  (COLACE) capsule 100 mg  100 mg Oral Daily Jadapalle, Sree, MD   100 mg at 06/24/24 9072   fluPHENAZine  (PROLIXIN ) tablet 10 mg  10 mg Oral BID Shrivastava, Aryendra, MD   10 mg at 06/24/24 9072   Or   fluPHENAZine  (PROLIXIN ) injection 5 mg  5 mg Intramuscular BID Shrivastava, Aryendra, MD       magnesium  hydroxide (MILK OF MAGNESIA) suspension 30 mL  30 mL Oral Daily PRN Mardy Elveria DEL, NP       OLANZapine  (ZYPREXA ) injection 5 mg  5 mg Intramuscular TID PRN Mardy Elveria DEL, NP   5 mg at 04/12/24 2230   OLANZapine  zydis (ZYPREXA ) disintegrating tablet 5 mg  5 mg Oral TID PRN Mardy Elveria DEL, NP       polyethylene glycol (MIRALAX  / GLYCOLAX ) packet 17 g  17 g Oral Daily PRN Donnelly Mellow, MD   17 g at 06/04/24 2123    Lab Results:  No results found for this or any previous visit (from the past 48 hours).    Blood Alcohol level:  Lab Results  Component Value Date   [facility] <15 03/26/2024    Metabolic  Disorder Labs: Lab Results  Component Value Date   HGBA1C 5.6 04/02/2024   MPG 114 04/02/2024   No results found for: PROLACTIN Lab Results  Component Value Date   CHOL 146 04/02/2024   TRIG 37 04/02/2024   HDL 43 04/02/2024   CHOLHDL 3.4 04/02/2024   VLDL 7  04/02/2024   LDLCALC 96 04/02/2024    Physical Findings: AIMS:  , ,  ,  ,    CIWA:    COWS:      Psychiatric Specialty Exam:  Presentation  General Appearance:  Appropriate for Environment; Bizarre  Eye Contact: Fair  Speech: Clear and Coherent  Speech Volume: Normal    Mood and Affect  Mood: Fine Affect: Congruent   Thought Process  Thought Processes: Disorganized  Descriptions of Associations:Intact  Orientation:Full (Time, Place and Person)  Thought Content: Paranoia at baseline Hallucinations: Denies  Ideas of Reference: Paranoia at baseline Suicidal Thoughts: Denies  Homicidal Thoughts: Denies   Sensorium  Memory: impiared  Judgment: Impaired  Insight: Shallow   Executive Functions  Concentration: Fair  Attention Span: Fair  Recall: [employer] of Knowledge: Fair  Language: Fair   Psychomotor Activity  Psychomotor Activity: No data recorded  Musculoskeletal: Strength & Muscle Tone: within normal limits Gait & Station: normal Assets  Assets: Manufacturing Systems Engineer; Resilience    Physical Exam: Physical Exam Vitals and nursing note reviewed.    ROS Blood pressure 96/71, pulse 70, temperature 97.9 F (36.6 C), resp. rate 15, height 6' (1.829 m), weight 100 kg, SpO2 100%. Body mass index is 29.91 kg/m.  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  Patient needs to be monitored closely for  ongoing psychosis and paranoid delusions.  Given patient's confusion and chronic paranoia, no family support, APS report has been made and it got screened in for further evaluation by APS.   Differential diagnosis include: unspecified psychosis, delusion disorder, dementia, schizophrenia/schizoaffective disorder   Treatment Plan Summary: Requested the social work team to reach out to APS for safety concerns in the community as patient has chronic paranoia and confusion and is currently homeless patient remains confused and paranoid and spite of being compliant with medications.  Patient displays cognitive delay and deficits in executive function like planning, organizing and processing information.  At this point patient lacks capacity to make medical decisions.  We recommend APS referral for possible legal guardianship as he has no family or legal next of kin to help with decision-making.   Safety and Monitoring:             -- InVoluntary admission to inpatient psychiatric unit for safety, stabilization and treatment             -- Daily contact with patient to assess and evaluate symptoms and progress in treatment             -- Patient's case to be discussed in multi-disciplinary team meeting             -- Observation Level: q15 minute checks             -- Vital signs:  q12 hours             -- Precautions: suicide, elopement, and assault   2. Psychiatric Diagnoses and Treatment:              Continue Prolixin  to 10 mg twice daily.  -- The risks/benefits/side-effects/alternatives to this medication were discussed in detail with the patient and time was given for questions. The patient consents to medication trial.                --  Metabolic profile and EKG monitoring obtained while on an atypical antipsychotic (BMI: Lipid Panel: HbgA1c: QTc:)              -- Encouraged patient to participate in unit milieu and in scheduled group therapies                            3. Medical Issues  Being Addressed:    No urgent medical needs noted  4. Discharge Planning:   -- Social work and case management to assist with discharge planning and identification of hospital follow-up needs prior to discharge  -- Estimated LOS: 3-4 days  Daine KATHEE Ober, NP 06/24/2024, 2:19 PM

## 2024-06-24 NOTE — Plan of Care (Signed)
   Problem: Education: Goal: Knowledge of General Education information will improve Description: Including pain rating scale, medication(s)/side effects and non-pharmacologic comfort measures Outcome: Progressing   Problem: Clinical Measurements: Goal: Respiratory complications will improve Outcome: Progressing   Problem: Nutrition: Goal: Adequate nutrition will be maintained Outcome: Progressing

## 2024-06-24 NOTE — Progress Notes (Signed)
   06/24/24 9386  Psych Admission Type (Psych Patients Only)  Admission Status Voluntary  Psychosocial Assessment  Patient Complaints None  Eye Contact Darting  Facial Expression Flat  Affect Flat  Speech Logical/coherent  Interaction Assertive  Motor Activity Slow  Appearance/Hygiene In scrubs  Behavior Characteristics Appropriate to situation  Mood Pleasant  Thought Process  Coherency WDL  Content WDL  Delusions None reported or observed  Perception WDL  Hallucination None reported or observed  Judgment Impaired  Confusion Mild  Danger to Self  Current suicidal ideation? Denies  Self-Injurious Behavior No self-injurious ideation or behavior indicators observed or expressed   Agreement Not to Harm Self Yes  Description of Agreement Verbal  Danger to Others  Danger to Others None reported or observed

## 2024-06-24 NOTE — Group Note (Signed)
 [facility] LCSW Group Therapy Note   Group Date: 06/24/2024 Start Time: 1300 End Time: 1400   Type of Therapy/Topic:  Group Therapy:  Emotion Regulation  Participation Level:  Did Not Attend   Mood:  Description of Group:    The purpose of this group is to assist patients in learning to regulate negative emotions and experience positive emotions. Patients will be guided to discuss ways in which they have been vulnerable to their negative emotions. These vulnerabilities will be juxtaposed with experiences of positive emotions or situations, and patients challenged to use positive emotions to combat negative ones. Special emphasis will be placed on coping with negative emotions in conflict situations, and patients will process healthy conflict resolution skills.  Therapeutic Goals: Patient will identify two positive emotions or experiences to reflect on in order to balance out negative emotions:  Patient will label two or more emotions that they find the most difficult to experience:  Patient will be able to demonstrate positive conflict resolution skills through discussion or role plays:   Summary of Patient Progress:   Patient did not attend.    Therapeutic Modalities:   Cognitive Behavioral Therapy Feelings Identification Dialectical Behavioral Therapy   Aldo HERO Liley Rake, LCSW

## 2024-06-24 NOTE — Progress Notes (Signed)
   06/24/24 0740  Psych Admission Type (Psych Patients Only)  Admission Status Voluntary  Psychosocial Assessment  Patient Complaints None  Eye Contact Darting  Facial Expression Flat  Affect Flat  Speech Logical/coherent  Interaction Assertive  Motor Activity Slow  Appearance/Hygiene In scrubs  Behavior Characteristics Appropriate to situation  Mood Pleasant  Thought Process  Coherency WDL  Content WDL  Delusions None reported or observed  Perception WDL  Hallucination None reported or observed  Judgment Impaired  Confusion Mild  Danger to Self  Current suicidal ideation? Denies  Self-Injurious Behavior No self-injurious ideation or behavior indicators observed or expressed   Agreement Not to Harm Self Yes  Description of Agreement verbal  Danger to Others  Danger to Others None reported or observed

## 2024-06-24 NOTE — Group Note (Signed)
 Date:  06/24/2024 Time:  10:27 AM  Group Topic/Focus:  Movement Therapy    Participation Level:  Did Not Attend    Benjamin [name] 06/24/2024, 10:27 AM

## 2024-06-24 NOTE — Plan of Care (Signed)
  Problem: Education: Goal: Knowledge of General Education information will improve Description: Including pain rating scale, medication(s)/side effects and non-pharmacologic comfort measures Outcome: Progressing   Problem: Health Behavior/Discharge Planning: Goal: Ability to manage health-related needs will improve Outcome: Progressing   Problem: Clinical Measurements: Goal: Ability to maintain clinical measurements within normal limits will improve Outcome: Progressing Goal: Will remain free from infection Outcome: Progressing Goal: Diagnostic test results will improve Outcome: Progressing Goal: Respiratory complications will improve Outcome: Progressing Goal: Cardiovascular complication will be avoided Outcome: Progressing   Problem: Activity: Goal: Risk for activity intolerance will decrease Outcome: Progressing   Problem: Nutrition: Goal: Adequate nutrition will be maintained Outcome: Progressing   Problem: Coping: Goal: Level of anxiety will decrease Outcome: Progressing   Problem: Elimination: Goal: Will not experience complications related to bowel motility Outcome: Progressing Goal: Will not experience complications related to urinary retention Outcome: Progressing   Problem: Pain Managment: Goal: General experience of comfort will improve and/or be controlled Outcome: Progressing   Problem: Safety: Goal: Ability to remain free from injury will improve Outcome: Progressing   Problem: Skin Integrity: Goal: Risk for impaired skin integrity will decrease Outcome: Progressing   Problem: Education: Goal: Knowledge of [place] [facility] Education information/materials will improve Outcome: Progressing Goal: Emotional status will improve Outcome: Progressing Goal: Mental status will improve Outcome: Progressing Goal: Verbalization of understanding the information provided will improve Outcome: Progressing   Problem: Activity: Goal: Interest or  engagement in activities will improve Outcome: Progressing Goal: Sleeping patterns will improve Outcome: Progressing   Problem: Coping: Goal: Ability to verbalize frustrations and anger appropriately will improve Outcome: Progressing Goal: Ability to demonstrate self-control will improve Outcome: Progressing   Problem: Health Behavior/Discharge Planning: Goal: Identification of resources available to assist in meeting health care needs will improve Outcome: Progressing Goal: Compliance with treatment plan for underlying cause of condition will improve Outcome: Progressing   Problem: Physical Regulation: Goal: Ability to maintain clinical measurements within normal limits will improve Outcome: Progressing   Problem: Safety: Goal: Periods of time without injury will increase Outcome: Progressing

## 2024-06-25 DIAGNOSIS — F22 Delusional disorders: Secondary | ICD-10-CM | POA: Diagnosis not present

## 2024-06-25 DIAGNOSIS — F0390 Unspecified dementia without behavioral disturbance: Secondary | ICD-10-CM | POA: Diagnosis not present

## 2024-06-25 NOTE — Progress Notes (Signed)
 Behavior: Pleasant and cooperative.   Psych assessment:  Denies SI/HI and AVH.  Interaction / Group attendance:  Isolates to room with the exception of meals. Minimal interaction with peers.  Medication/ PRNs: Compliant.  Pain: Denies.  15 min checks in place for safety.

## 2024-06-25 NOTE — Group Note (Signed)
 Recreation Therapy Group Note   Group Topic:Other  Group Date: 06/25/2024 Start Time: 1400 End Time: 1445 Facilitators: Celestia Jeoffrey BRAVO, LRT, CTRS Location: Dayroom  Activity Description/Intervention: Therapeutic Drumming. Patients with peers and staff were given the opportunity to engage in a leader facilitated [facility] Group Empowerment Drumming Circle with staff from the [employer], in partnership with [employer]. Teaching laboratory technician and trained [employer], Norleen Mon leading with LRT observing and documenting intervention and pt response. This evidenced-based practice targets 7 areas of health and wellbeing in the human experience including: stress-reduction, exercise, self-expression, camaraderie/support, nurturing, spirituality, and music-making (leisure).    Goal Area(s) Addresses:  Patient will engage in pro-social way in music group.  Patient will follow directions of drum leader on the first prompt. Patient will demonstrate no behavioral issues during group.  Patient will identify if a reduction in stress level occurs as a result of participation in therapeutic drum circle.   Affect/Mood: N/A   Participation Level: Did not attend    Clinical Observations/Individualized Feedback: Patient did not attend group.  Plan: Continue to engage patient in RT group sessions 2-3x/week.   Jeoffrey BRAVO Celestia, LRT, CTRS 06/25/2024 4:31 PM

## 2024-06-25 NOTE — Plan of Care (Signed)
  Problem: Activity: Goal: Sleeping patterns will improve Outcome: Progressing   

## 2024-06-25 NOTE — Progress Notes (Signed)
 [facility] MD Progress Note  06/25/2024 12:58 PM Benjamin [name]  MRN:  [id number] Benjamin [name] is a 78 year old male who presents to the inpatient geriatric psych unit after jumping from a two story building. Patient was originally seen at [facility] who referred him to the ED who admitted him here on 03/27/2024. Patient reports that people from the [place] gang were trying to kill him and the only escape was through the bedroom window on the second floor. He currently lives in the house with [name] and his family and has been living with them for 3-4 years without any problems. On 03/19/2024 he went to the doctor and when he got back home the family wouldn't let him leave. He states that after he realized the gang was going to kill him he barricaded the door with his bed and jumped out the window. When he landed he ran away until one of his neighbors found him and called 911. Patient is admitted to [facility] unit with Q15 min safety monitoring. Multidisciplinary team approach is offered. Medication management; group/milieu therapy is offered.     Subjective: Chart is reviewed and discussed with the treatment team.  APS is working on legal guardianship and placement.  He has guardianship hearing June 19, 2024  Patient is noted to be resting in bed.  He offers no complaints.  He did request to know about his placement options.  Provider updated patient about financial aid and placement confirmation.  Patient denies feeling sad or anxious.  He denies SI/HI/plan and denies auditory/visual hallucinations.  He denies having any physical complaints.  Per nursing staff he takes his medications with no reported side effects Sleep: Fair  Appetite:  Fair  Past Psychiatric History: see h&P  Family History:  Family History  Problem Relation Age of Onset   Colon cancer Neg Hx    Colon polyps Neg Hx    Stomach cancer Neg Hx    Esophageal cancer Neg Hx    Social History:  Social History    Substance and Sexual Activity  Alcohol Use No     Social History   Substance and Sexual Activity  Drug Use No    Social History   Socioeconomic History   Marital status: Single    Spouse name: Not on file   Number of children: Not on file   Years of education: Not on file   Highest education level: Not on file  Occupational History   Not on file  Tobacco Use   Smoking status: Former    Types: Cigarettes   Smokeless tobacco: Never  Vaping Use   Vaping status: Never Used  Substance and Sexual Activity   Alcohol use: No   Drug use: No   Sexual activity: Not on file  Other Topics Concern   Not on file  Social History Narrative   Not on file   Social Drivers of Health   Financial Resource Strain: Not on file  Food Insecurity: No Food Insecurity (03/27/2024)   Hunger Vital Sign    Worried About Running Out of Food in the Last Year: Never true    Ran Out of Food in the Last Year: Never true  Transportation Needs: No Transportation Needs (03/27/2024)   PRAPARE - Administrator, Civil Service (Medical): No    Lack of Transportation (Non-Medical): No  Physical Activity: Not on file  Stress: Not on file  Social Connections: Moderately Integrated (03/27/2024)   Social Connection and Isolation  Panel    Frequency of Communication with Friends and Family: Twice a week    Frequency of Social Gatherings with Friends and Family: Once a week    Attends Religious Services: 1 to 4 times per year    Active Member of [employer] or Organizations: No    Attends Engineer, Structural: 1 to 4 times per year    Marital Status: Divorced   Past Medical History:  Past Medical History:  Diagnosis Date   Allergy    Arthritis    back   Prostate cancer (HCC) dx'd 2010   surg only    Past Surgical History:  Procedure Laterality Date   COLONOSCOPY     EYE SURGERY     PROSTATE SURGERY      Current Medications: Current Facility-Administered Medications  Medication Dose  Route Frequency Provider Last Rate Last Admin   acetaminophen  (TYLENOL ) tablet 650 mg  650 mg Oral Q6H PRN Coleman, Carolyn H, NP       alum & mag hydroxide-simeth (MAALOX/MYLANTA) 200-200-20 MG/5ML suspension 30 mL  30 mL Oral Q4H PRN Coleman, Carolyn H, NP       docusate sodium  (COLACE) capsule 100 mg  100 mg Oral Daily Chelsey Kimberley, MD   100 mg at 06/25/24 0948   fluPHENAZine  (PROLIXIN ) tablet 10 mg  10 mg Oral BID Shrivastava, Aryendra, MD   10 mg at 06/25/24 9052   Or   fluPHENAZine  (PROLIXIN ) injection 5 mg  5 mg Intramuscular BID Shrivastava, Aryendra, MD       magnesium  hydroxide (MILK OF MAGNESIA) suspension 30 mL  30 mL Oral Daily PRN Mardy Elveria DEL, NP       OLANZapine  (ZYPREXA ) injection 5 mg  5 mg Intramuscular TID PRN Mardy Elveria DEL, NP   5 mg at 04/12/24 2230   OLANZapine  zydis (ZYPREXA ) disintegrating tablet 5 mg  5 mg Oral TID PRN Mardy Elveria DEL, NP       polyethylene glycol (MIRALAX  / GLYCOLAX ) packet 17 g  17 g Oral Daily PRN Donnelly Mellow, MD   17 g at 06/04/24 2123    Lab Results:  No results found for this or any previous visit (from the past 48 hours).    Blood Alcohol level:  Lab Results  Component Value Date   [facility] <15 03/26/2024    Metabolic Disorder Labs: Lab Results  Component Value Date   HGBA1C 5.6 04/02/2024   MPG 114 04/02/2024   No results found for: PROLACTIN Lab Results  Component Value Date   CHOL 146 04/02/2024   TRIG 37 04/02/2024   HDL 43 04/02/2024   CHOLHDL 3.4 04/02/2024   VLDL 7 04/02/2024   LDLCALC 96 04/02/2024    Physical Findings: AIMS:  , ,  ,  ,    CIWA:    COWS:      Psychiatric Specialty Exam:  Presentation  General Appearance:  Appropriate for Environment; Bizarre  Eye Contact: Fair  Speech: Clear and Coherent  Speech Volume: Normal    Mood and Affect  Mood: Fine Affect: Congruent   Thought Process  Thought Processes: Disorganized  Descriptions of  Associations:Intact  Orientation:Full (Time, Place and Person)  Thought Content: Paranoia at baseline Hallucinations: Denies  Ideas of Reference: Paranoia at baseline Suicidal Thoughts: Denies  Homicidal Thoughts: Denies   Sensorium  Memory: impiared  Judgment: Impaired  Insight: Shallow   Executive Functions  Concentration: Fair  Attention Span: Fair  Recall: [employer] of Knowledge: Fair  Language: Fair   Psychomotor Activity  Psychomotor Activity: No data recorded  Musculoskeletal: Strength & Muscle Tone: within normal limits Gait & Station: normal Assets  Assets: Manufacturing Systems Engineer; Resilience    Physical Exam: Physical Exam Vitals and nursing note reviewed.    ROS Blood pressure 119/89, pulse 69, temperature (!) 97.1 F (36.2 C), resp. rate 16, height 6' (1.829 m), weight 100 kg, SpO2 100%. Body mass index is 29.91 kg/m.  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  Patient needs to be monitored closely for ongoing psychosis and paranoid delusions.  Given patient's confusion and chronic paranoia, no family support, APS report has been made and it got screened in for further evaluation by APS.   Differential diagnosis include: unspecified psychosis, delusion disorder, dementia, schizophrenia/schizoaffective disorder   Treatment Plan Summary: Requested the social work team to reach out to APS for safety concerns in the community as patient has chronic paranoia and confusion and is currently homeless patient remains confused and paranoid and spite of being compliant with medications.  Patient displays cognitive delay and deficits in executive function like planning, organizing and  processing information.  At this point patient lacks capacity to make medical decisions.  We recommend APS referral for possible legal guardianship as he has no family or legal next of kin to help with decision-making.   Safety and Monitoring:             -- InVoluntary admission to inpatient psychiatric unit for safety, stabilization and treatment             -- Daily contact with patient to assess and evaluate symptoms and progress in treatment             -- Patient's case to be discussed in multi-disciplinary team meeting             -- Observation Level: q15 minute checks             -- Vital signs:  q12 hours             -- Precautions: suicide, elopement, and assault   2. Psychiatric Diagnoses and Treatment:              Continue Prolixin  to 10 mg twice daily.  -- The risks/benefits/side-effects/alternatives to this medication were discussed in detail with the patient and time was given for questions. The patient consents to medication trial.                -- Metabolic profile and EKG monitoring obtained while on an atypical antipsychotic (BMI: Lipid Panel: HbgA1c: QTc:)              -- Encouraged patient to participate in unit milieu and in scheduled group therapies                            3. Medical Issues Being Addressed:    No urgent medical needs noted  4. Discharge Planning:   -- Social work and case management to assist with discharge planning and identification of hospital follow-up needs prior to discharge  -- Estimated LOS: 3-4 days  Allyn Foil, MD 06/25/2024, 12:58 PM

## 2024-06-25 NOTE — Group Note (Signed)
 Date:  06/25/2024 Time:  11:31 PM  Group Topic/Focus:  Wrap-Up Group:   The focus of this group is to help patients review their daily goal of treatment and discuss progress on daily workbooks.    Participation Level:  Did Not Attend  Participation Quality:     Affect:     Cognitive:     Insight: None  Engagement in Group:  None  Modes of Intervention:     Additional Comments:    [name] 06/25/2024, 11:31 PM

## 2024-06-25 NOTE — Plan of Care (Signed)
   Problem: Education: Goal: Emotional status will improve Outcome: Progressing Goal: Mental status will improve Outcome: Progressing   Problem: Safety: Goal: Periods of time without injury will increase Outcome: Progressing

## 2024-06-25 NOTE — BHH Counselor (Signed)
 CSW sent TB test results (both chest x-ray and skin test) to [name], via email,  per her request.   Lum Croft, MSW, [facility] 06/25/2024 9:35 AM

## 2024-06-26 DIAGNOSIS — F0390 Unspecified dementia without behavioral disturbance: Secondary | ICD-10-CM | POA: Diagnosis not present

## 2024-06-26 DIAGNOSIS — F22 Delusional disorders: Secondary | ICD-10-CM | POA: Diagnosis not present

## 2024-06-26 MED ORDER — MEMANTINE HCL 10 MG PO TABS
5.0000 mg | ORAL_TABLET | Freq: Two times a day (BID) | ORAL | Status: DC
Start: 2024-06-26 — End: 2024-06-26

## 2024-06-26 NOTE — Progress Notes (Signed)
   06/25/24 2100  Psych Admission Type (Psych Patients Only)  Admission Status Involuntary  Psychosocial Assessment  Patient Complaints None  Eye Contact Brief  Facial Expression Flat  Affect Flat  Speech Soft  Interaction Minimal  Motor Activity Slow  Appearance/Hygiene In scrubs  Behavior Characteristics Cooperative  Mood Pleasant  Thought Process  Coherency WDL  Content WDL  Delusions None reported or observed  Perception WDL  Hallucination None reported or observed  Judgment Impaired  Confusion Mild  Danger to Self  Current suicidal ideation? Denies  Self-Injurious Behavior No self-injurious ideation or behavior indicators observed or expressed   Agreement Not to Harm Self Yes  Description of Agreement Verbal  Danger to Others  Danger to Others None reported or observed

## 2024-06-26 NOTE — Plan of Care (Signed)
  Problem: Coping: Goal: Level of anxiety will decrease Outcome: Progressing   Problem: Education: Goal: Mental status will improve Outcome: Progressing

## 2024-06-26 NOTE — Progress Notes (Signed)
 Behavior:  Pleasant and cooperative.   Psych assessment:  Denies SI/HI and AVH.  Interaction / Group attendance:  Present in the milieu for meals and groups. Minimal interaction with peers and staff.  Medication/ PRNs: Compliant.  Pain: Denies.  15 min checks in place for safety.

## 2024-06-26 NOTE — Group Note (Unsigned)
 LCSW Group Therapy Note  Group Date: 06/26/2024 Start Time: 1315 End Time: 1400   Type of Therapy and Topic:  Group Therapy - How To Cope with Nervousness about Discharge   Participation Level:  {BHH PARTICIPATION [id number]}   Description of Group This process group involved identification of patients' feelings about discharge. Some of them are scheduled to be discharged soon, while others are new admissions, but each of them was asked to share thoughts and feelings surrounding discharge from the hospital. One common theme was that they are excited at the prospect of going home, while another was that many of them are apprehensive about sharing why they were hospitalized. Patients were given the opportunity to discuss these feelings with their peers in preparation for discharge.  Therapeutic Goals  1. Patient will identify their overall feelings about pending discharge. 2. Patient will think about how they might proactively address issues that they believe will once again arise once they get home (i.e. with parents). 3. Patients will participate in discussion about having hope for change.   Summary of Patient Progress:  *** was very active throughout the session. *** demonstrated *** insight into the subject matter, and proved open to input from peers and feedback from CSW. *** was respectful of peers and participated throughout the entire session.   Therapeutic Modalities Cognitive Behavioral Therapy   [name], LCSWA 06/26/2024  1:52 PM

## 2024-06-26 NOTE — Group Note (Signed)
 Recreation Therapy Group Note   Group Topic:Communication  Group Date: 06/26/2024 Start Time: 1400 End Time: 1430 Facilitators: Celestia Jeoffrey BRAVO, LRT, CTRS Location: Dayroom  Group Description: Name That Food. Pt will randomly select 1 cut paper from laminated stack of popular food images from the 1970's-1990's from LRT. Without showing the group the image they selected, pt will use descriptive words to explain what food they selected. Once the image is correctly guessed, LRT and pts will reminisce and discuss past fond memories associated with the food as well as the importance of communication.  Goal Area(s) Addressed: Patient will increase communication skills.  Patient will increase frustration tolerance skills. Patient will reminisce a fond memory in their life.     Affect/Mood: N/A   Participation Level: Did not attend    Clinical Observations/Individualized Feedback: Patient did not attend group.   Plan: Continue to engage patient in RT group sessions 2-3x/week.   Jeoffrey BRAVO Celestia, LRT, CTRS 06/26/2024 4:43 PM

## 2024-06-26 NOTE — Group Note (Signed)
 Date:  06/26/2024 Time:  12:48 PM  Group Topic/Focus:  Overcoming Stress:   The focus of this group is to define stress and help patients assess their triggers.    Participation Level:  Active  Participation Quality:  Appropriate  Affect:  Appropriate  Cognitive:  Appropriate  Insight: Appropriate  Engagement in Group:  Engaged  Modes of Intervention:  Discussion  Arland Nutting 06/26/2024, 12:48 PM

## 2024-06-26 NOTE — Plan of Care (Signed)
  Problem: Coping: Goal: Level of anxiety will decrease Outcome: Progressing   Problem: Coping: Goal: Ability to verbalize frustrations and anger appropriately will improve Outcome: Progressing Goal: Ability to demonstrate self-control will improve Outcome: Progressing   Problem: Safety: Goal: Periods of time without injury will increase Outcome: Progressing

## 2024-06-26 NOTE — Progress Notes (Signed)
 [facility] MD Progress Note  06/26/2024 1:46 PM Benjamin [name]  MRN:  [id number] Benjamin [name] is a 78 year old male who presents to the inpatient geriatric psych unit after jumping from a two story building. Patient was originally seen at [facility] who referred him to the ED who admitted him here on 03/27/2024. Patient reports that people from the [place] gang were trying to kill him and the only escape was through the bedroom window on the second floor. He currently lives in the house with [name] and his family and has been living with them for 3-4 years without any problems. On 03/19/2024 he went to the doctor and when he got back home the family wouldn't let him leave. He states that after he realized the gang was going to kill him he barricaded the door with his bed and jumped out the window. When he landed he ran away until one of his neighbors found him and called 911. Patient is admitted to [name] unit with Q15 min safety monitoring. Multidisciplinary team approach is offered. Medication management; group/milieu therapy is offered.     Subjective: Chart is reviewed and discussed with the treatment team.  APS is working on legal guardianship and placement.  He has guardianship hearing June 19, 2024  Patient is noted to be resting in bed.  He offers no complaints.  He reports that he has fair appetite and sleep.  He is taking his medications with no reported side effects.  Per nursing staff patient is not displaying any behavioral disturbance but remains confused at baseline.  He is redirectable with instructions and guidance to take care of his ADLs.  Past Psychiatric History: see h&P  Family History:  Family History  Problem Relation Age of Onset   Colon cancer Neg Hx    Colon polyps Neg Hx    Stomach cancer Neg Hx    Esophageal cancer Neg Hx    Social History:  Social History   Substance and Sexual Activity  Alcohol Use No     Social History   Substance and  Sexual Activity  Drug Use No    Social History   Socioeconomic History   Marital status: Single    Spouse name: Not on file   Number of children: Not on file   Years of education: Not on file   Highest education level: Not on file  Occupational History   Not on file  Tobacco Use   Smoking status: Former    Types: Cigarettes   Smokeless tobacco: Never  Vaping Use   Vaping status: Never Used  Substance and Sexual Activity   Alcohol use: No   Drug use: No   Sexual activity: Not on file  Other Topics Concern   Not on file  Social History Narrative   Not on file   Social Drivers of Health   Financial Resource Strain: Not on file  Food Insecurity: No Food Insecurity (03/27/2024)   Hunger Vital Sign    Worried About Running Out of Food in the Last Year: Never true    Ran Out of Food in the Last Year: Never true  Transportation Needs: No Transportation Needs (03/27/2024)   PRAPARE - Administrator, Civil Service (Medical): No    Lack of Transportation (Non-Medical): No  Physical Activity: Not on file  Stress: Not on file  Social Connections: Moderately Integrated (03/27/2024)   Social Connection and Isolation Panel    Frequency of Communication with Friends and  Family: Twice a week    Frequency of Social Gatherings with Friends and Family: Once a week    Attends Religious Services: 1 to 4 times per year    Active Member of [employer] or Organizations: No    Attends Engineer, Structural: 1 to 4 times per year    Marital Status: Divorced   Past Medical History:  Past Medical History:  Diagnosis Date   Allergy    Arthritis    back   Prostate cancer (HCC) dx'd 2010   surg only    Past Surgical History:  Procedure Laterality Date   COLONOSCOPY     EYE SURGERY     PROSTATE SURGERY      Current Medications: Current Facility-Administered Medications  Medication Dose Route Frequency Provider Last Rate Last Admin   acetaminophen  (TYLENOL ) tablet 650 mg  650  mg Oral Q6H PRN Coleman, Carolyn H, NP       alum & mag hydroxide-simeth (MAALOX/MYLANTA) 200-200-20 MG/5ML suspension 30 mL  30 mL Oral Q4H PRN Coleman, Carolyn H, NP       docusate sodium  (COLACE) capsule 100 mg  100 mg Oral Daily Bayley Hurn, MD   100 mg at 06/26/24 1012   fluPHENAZine  (PROLIXIN ) tablet 10 mg  10 mg Oral BID Shrivastava, Aryendra, MD   10 mg at 06/26/24 1012   Or   fluPHENAZine  (PROLIXIN ) injection 5 mg  5 mg Intramuscular BID Shrivastava, Aryendra, MD       magnesium  hydroxide (MILK OF MAGNESIA) suspension 30 mL  30 mL Oral Daily PRN Mardy Elveria DEL, NP       OLANZapine  (ZYPREXA ) injection 5 mg  5 mg Intramuscular TID PRN Mardy Elveria DEL, NP   5 mg at 04/12/24 2230   OLANZapine  zydis (ZYPREXA ) disintegrating tablet 5 mg  5 mg Oral TID PRN Coleman, Carolyn H, NP       polyethylene glycol (MIRALAX  / GLYCOLAX ) packet 17 g  17 g Oral Daily PRN Donnelly Mellow, MD   17 g at 06/04/24 2123    Lab Results:  No results found for this or any previous visit (from the past 48 hours).    Blood Alcohol level:  Lab Results  Component Value Date   [facility] <15 03/26/2024    Metabolic Disorder Labs: Lab Results  Component Value Date   HGBA1C 5.6 04/02/2024   MPG 114 04/02/2024   No results found for: PROLACTIN Lab Results  Component Value Date   CHOL 146 04/02/2024   TRIG 37 04/02/2024   HDL 43 04/02/2024   CHOLHDL 3.4 04/02/2024   VLDL 7 04/02/2024   LDLCALC 96 04/02/2024    Physical Findings: AIMS:  , ,  ,  ,    CIWA:    COWS:      Psychiatric Specialty Exam:  Presentation  General Appearance:  Appropriate for Environment; Bizarre  Eye Contact: Fair  Speech: Normal rate  Speech Volume: Normal    Mood and Affect  Mood: Fine Affect: Congruent   Thought Process  Thought Processes: Disorganized At baseline Descriptions of Associations:Intact  Orientation:Full (Time, Place and Person)  Thought Content: Paranoia at  baseline Hallucinations: Denies  Ideas of Reference: Paranoia at baseline Suicidal Thoughts: Denies  Homicidal Thoughts: Denies   Sensorium  Memory: impiared  Judgment: Impaired  Insight: Shallow   Executive Functions  Concentration: Fair  Attention Span: Fair  Recall: [employer] of Knowledge: Fair  Language: Fair   Psychomotor Activity  Psychomotor Activity: No  data recorded  Musculoskeletal: Strength & Muscle Tone: within normal limits Gait & Station: normal Assets  Assets: Manufacturing Systems Engineer; Resilience    Physical Exam: Physical Exam Vitals and nursing note reviewed.    ROS Blood pressure 102/82, pulse 75, temperature 98 F (36.7 C), resp. rate 18, height 6' (1.829 m), weight 100 kg, SpO2 100%. Body mass index is 29.91 kg/m.  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  Patient needs to be monitored closely for ongoing psychosis and paranoid delusions.  Given patient's confusion and chronic paranoia, no family support, APS has taken up guardianship and looking for placement     Safety and Monitoring:             -- InVoluntary admission to inpatient psychiatric unit for safety, stabilization and treatment             -- Daily contact with patient to assess and evaluate symptoms and progress in treatment             -- Patient's case to be discussed in multi-disciplinary team meeting             -- Observation Level: q15 minute checks             -- Vital signs:  q12 hours             -- Precautions: suicide, elopement, and assault   2. Psychiatric Diagnoses and Treatment:              Continue Prolixin  to 10 mg twice daily.  -- The risks/benefits/side-effects/alternatives to this  medication were discussed in detail with the patient and time was given for questions. The patient consents to medication trial.                -- Metabolic profile and EKG monitoring obtained while on an atypical antipsychotic (BMI: Lipid Panel: HbgA1c: QTc:)              -- Encouraged patient to participate in unit milieu and in scheduled group therapies                            3. Medical Issues Being Addressed:    No urgent medical needs noted  4. Discharge Planning:   -- Social work and case management to assist with discharge planning and identification of hospital follow-up needs prior to discharge  -- Estimated LOS: 3-4 days  Allyn Foil, MD 06/26/2024, 1:46 PM

## 2024-06-27 DIAGNOSIS — F22 Delusional disorders: Secondary | ICD-10-CM | POA: Diagnosis not present

## 2024-06-27 DIAGNOSIS — F0390 Unspecified dementia without behavioral disturbance: Secondary | ICD-10-CM | POA: Diagnosis not present

## 2024-06-27 NOTE — Group Note (Signed)
 Date:  06/27/2024 Time:  10:58 AM  Group Topic/Focus:  Building Self Esteem:   The Focus of this group is helping patients become aware of the effects of self-esteem on their lives, the things they and others do that enhance or undermine their self-esteem, seeing the relationship between their level of self-esteem and the choices they make and learning ways to enhance self-esteem.    Participation Level:  Did Not Attend   Benjamin [name] 06/27/2024, 10:58 AM

## 2024-06-27 NOTE — Plan of Care (Signed)
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
## 2024-06-27 NOTE — Progress Notes (Signed)
 SI/HI: Denies  Behavior Mood: Pt is alert but forgetfulness at times. Calm and cooperative. Flat affect. Denies anxiety and depression. No behavior issues noted.   Interaction/Group: Isolates in room throughout the shift  Medications/PRNs: po med compliant/no PRNs given.   Pain: denies  Other: PPD negative. Slept 9.5 hours   06/26/24 2100  Psych Admission Type (Psych Patients Only)  Admission Status Involuntary  Psychosocial Assessment  Patient Complaints None  Eye Contact Brief  Facial Expression Flat  Affect Flat  Speech Logical/coherent  Interaction Minimal  Motor Activity Slow  Appearance/Hygiene In scrubs  Behavior Characteristics Appropriate to situation  Mood Pleasant  Thought Process  Coherency WDL  Content WDL  Delusions None reported or observed  Perception WDL  Hallucination None reported or observed  Judgment Impaired  Confusion Mild

## 2024-06-27 NOTE — Progress Notes (Signed)
   06/27/24 1100  Psych Admission Type (Psych Patients Only)  Admission Status Involuntary  Psychosocial Assessment  Patient Complaints None  Eye Contact Brief  Facial Expression Flat  Affect Flat  Speech Logical/coherent  Interaction Minimal  Motor Activity Slow  Appearance/Hygiene In scrubs  Behavior Characteristics Appropriate to situation  Mood Pleasant  Thought Process  Coherency WDL  Content WDL  Delusions None reported or observed  Perception WDL  Hallucination None reported or observed  Judgment Impaired  Confusion Mild  Danger to Self  Current suicidal ideation? Denies  Agreement Not to Harm Self Yes  Description of Agreement verbal  Danger to Others  Danger to Others None reported or observed

## 2024-06-27 NOTE — Plan of Care (Signed)

## 2024-06-28 DIAGNOSIS — S01112A Laceration without foreign body of left eyelid and periocular area, initial encounter: Secondary | ICD-10-CM

## 2024-06-28 DIAGNOSIS — F0390 Unspecified dementia without behavioral disturbance: Secondary | ICD-10-CM | POA: Diagnosis not present

## 2024-06-28 DIAGNOSIS — F22 Delusional disorders: Secondary | ICD-10-CM | POA: Diagnosis not present

## 2024-06-28 NOTE — Group Note (Signed)
 [facility] LCSW Group Therapy Note   Group Date: 06/28/2024 Start Time: 1315 End Time: 1345   Type of Therapy/Topic:  Group Therapy:  Emotion Regulation  Participation Level:  Did Not Attend   Mood:  Description of Group:    The purpose of this group is to assist patients in learning to regulate negative emotions and experience positive emotions. Patients will be guided to discuss ways in which they have been vulnerable to their negative emotions. These vulnerabilities will be juxtaposed with experiences of positive emotions or situations, and patients challenged to use positive emotions to combat negative ones. Special emphasis will be placed on coping with negative emotions in conflict situations, and patients will process healthy conflict resolution skills.  Therapeutic Goals: Patient will identify two positive emotions or experiences to reflect on in order to balance out negative emotions:  Patient will label two or more emotions that they find the most difficult to experience:  Patient will be able to demonstrate positive conflict resolution skills through discussion or role plays:   Summary of Patient Progress:   X    Therapeutic Modalities:   Cognitive Behavioral Therapy Feelings Identification Dialectical Behavioral Therapy   [name], LCSWA

## 2024-06-28 NOTE — BH IP Treatment Plan (Signed)
 Interdisciplinary Treatment and Diagnostic Plan Update  06/28/2024 Time of Session: 3:00 PM  Benjamin [name] MRN: [id number]  Principal Diagnosis: Delusional disorder [name])  Secondary Diagnoses: Principal Problem:   Delusional disorder (HCC)   Current Medications:  Current Facility-Administered Medications  Medication Dose Route Frequency Provider Last Rate Last Admin   acetaminophen  (TYLENOL ) tablet 650 mg  650 mg Oral Q6H PRN Coleman, Carolyn H, NP       alum & mag hydroxide-simeth (MAALOX/MYLANTA) 200-200-20 MG/5ML suspension 30 mL  30 mL Oral Q4H PRN Coleman, Carolyn H, NP       docusate sodium  (COLACE) capsule 100 mg  100 mg Oral Daily Jadapalle, Sree, MD   100 mg at 06/28/24 0941   fluPHENAZine  (PROLIXIN ) tablet 10 mg  10 mg Oral BID Shrivastava, Aryendra, MD   10 mg at 06/28/24 0941   Or   fluPHENAZine  (PROLIXIN ) injection 5 mg  5 mg Intramuscular BID Shrivastava, Aryendra, MD       magnesium  hydroxide (MILK OF MAGNESIA) suspension 30 mL  30 mL Oral Daily PRN Coleman, Carolyn H, NP       OLANZapine  (ZYPREXA ) injection 5 mg  5 mg Intramuscular TID PRN Mardy Elveria DEL, NP   5 mg at 04/12/24 2230   OLANZapine  zydis (ZYPREXA ) disintegrating tablet 5 mg  5 mg Oral TID PRN Coleman, Carolyn H, NP       polyethylene glycol (MIRALAX  / GLYCOLAX ) packet 17 g  17 g Oral Daily PRN Jadapalle, Sree, MD   17 g at 06/04/24 2123   PTA Medications: Medications Prior to Admission  Medication Sig Dispense Refill Last Dose/Taking   docusate sodium  (COLACE) 100 MG capsule Take 100 mg by mouth daily.      omega-3 acid ethyl esters (LOVAZA) 1 g capsule Take 1 g by mouth daily.       Patient Stressors: Traumatic event    Patient Strengths: Communication skills   Treatment Modalities: Medication Management, Group therapy, Case management,  1 to 1 session with clinician, Psychoeducation, Recreational therapy.   Physician Treatment Plan for Primary Diagnosis: Delusional disorder [facility]) Long Term  Goal(s): Improvement in symptoms so as ready for discharge   Short Term Goals: Ability to identify changes in lifestyle to reduce recurrence of condition will improve Ability to verbalize feelings will improve Ability to disclose and discuss suicidal ideas Ability to demonstrate self-control will improve Ability to identify and develop effective coping behaviors will improve  Medication Management: Evaluate patient's response, side effects, and tolerance of medication regimen.  Therapeutic Interventions: 1 to 1 sessions, Unit Group sessions and Medication administration.  Evaluation of Outcomes: Progressing  Physician Treatment Plan for Secondary Diagnosis: Principal Problem:   Delusional disorder (HCC)  Long Term Goal(s): Improvement in symptoms so as ready for discharge   Short Term Goals: Ability to identify changes in lifestyle to reduce recurrence of condition will improve Ability to verbalize feelings will improve Ability to disclose and discuss suicidal ideas Ability to demonstrate self-control will improve Ability to identify and develop effective coping behaviors will improve     Medication Management: Evaluate patient's response, side effects, and tolerance of medication regimen.  Therapeutic Interventions: 1 to 1 sessions, Unit Group sessions and Medication administration.  Evaluation of Outcomes: Progressing   RN Treatment Plan for Primary Diagnosis: Delusional disorder (HCC) Long Term Goal(s): Knowledge of disease and therapeutic regimen to maintain health will improve  Short Term Goals: Ability to remain free from injury will improve, Ability to verbalize frustration and anger appropriately  will improve, Ability to demonstrate self-control, Ability to participate in decision making will improve, Ability to verbalize feelings will improve, Ability to disclose and discuss suicidal ideas, Ability to identify and develop effective coping behaviors will improve, and  Compliance with prescribed medications will improve  Medication Management: RN will administer medications as ordered by provider, will assess and evaluate patient's response and provide education to patient for prescribed medication. RN will report any adverse and/or side effects to prescribing provider.  Therapeutic Interventions: 1 on 1 counseling sessions, Psychoeducation, Medication administration, Evaluate responses to treatment, Monitor vital signs and CBGs as ordered, Perform/monitor CIWA, COWS, AIMS and Fall Risk screenings as ordered, Perform wound care treatments as ordered.  Evaluation of Outcomes: Progressing   LCSW Treatment Plan for Primary Diagnosis: Delusional disorder [facility]) Long Term Goal(s): Safe transition to appropriate next level of care at discharge, Engage patient in therapeutic group addressing interpersonal concerns.  Short Term Goals: Engage patient in aftercare planning with referrals and resources, Increase social support, Increase ability to appropriately verbalize feelings, Increase emotional regulation, Facilitate acceptance of mental health diagnosis and concerns, Facilitate patient progression through stages of change regarding substance use diagnoses and concerns, Identify triggers associated with mental health/substance abuse issues, and Increase skills for wellness and recovery  Therapeutic Interventions: Assess for all discharge needs, 1 to 1 time with Social worker, Explore available resources and support systems, Assess for adequacy in community support network, Educate family and significant other(s) on suicide prevention, Complete Psychosocial Assessment, Interpersonal group therapy.  Evaluation of Outcomes: Progressing   Progress in Treatment: Attending groups: Yes. and No. Participating in groups: Yes. and No. Taking medication as prescribed: Yes. Toleration medication: Yes. Family/Significant other contact made: Yes, individual(s) contacted:   [name], borther, [phone]  Patient understands diagnosis: No. Discussing patient identified problems/goals with staff: Yes. Medical problems stabilized or resolved: Yes. Denies suicidal/homicidal ideation: Yes. Issues/concerns per patient self-inventory: No. Other: None   New problem(s) identified: No, Describe:  05/14/24 Update: None  05/24/24 Update: No changes at this time.  Update 05/29/2024:  No changes at this time.  Update 06/03/2024:  No changes at this time. Update 06/08/24: No changes at this time Update 06/13/24: No changes at this time  Update 06/18/24: No changes at this time Update 06/23/24: No changes at this time  Update 06/28/24: No changes at this time        New Short Term/Long Term Goal(s): elimination of symptoms of psychosis, medication management for mood stabilization; elimination of SI thoughts; development of comprehensive mental wellness plan. Update 04/02/24: No changes at this time. Update 04/07/24: No changes at this time. Update 04/12/24: No changes at this time Update 04/18/24: No changes at this time  Update 04/23/24: No changes at this time. Update 04/28/2024: No changes at this time.  Update 05/04/2024: No changes at this time.  Update 05/09/2024:  No changes at this time. 05/14/24 Update: No changes at this time. 05/19/24 Update: No changes at this time. 05/24/24 Update: No changes at this time. Update 05/29/2024:  No changes at this time. Update 06/03/2024:  No changes at this time. Update 06/08/24: No changes at this time Update 06/13/24: No changes at this time Update 06/18/24: No changes at this time Update 06/23/24: No changes at this time Update 06/28/24: No changes at this time    Patient Goals:  Get out of here, I want to get healed up so I can get out of here Update 04/02/24: No changes at this time. Update 04/07/24:  No changes at this time .Update 04/12/24: No changes at this time Update 04/18/24: No changes at this time  Update 04/23/24: No changes at this  time. Update 04/28/2024: No changes at this time. Update 05/04/2024: No changes at this time.   Update 05/29/2024:  No changes at this time.  Update 06/03/2024:  No changes at this time. Update 06/08/24: No changes at this time Update 06/13/24: No changes at this time Update 06/18/24: No changes at this time Update 06/23/24: No changes at this time Update 06/28/24: No changes at this time    Discharge Plan or Barriers: CSW will assist with appropriate discharge planning  Update 04/02/24: No changes at this time. Update 04/07/24: No changes at this time.Update 04/12/24: No changes at this time Update 04/18/24: CSW submitted report to APS. Care team looking at guardianship for pt at this time  Update 04/23/24: No changes at this time. Update 04/28/2024: No changes at this time. Update 05/04/2024: No changes at this time.  Update 05/09/2024:  [facility] APS continues to search for placement. 05/14/24 Update: [facility] APS continues to look for placement. CSW to continue to assess. 05/19/24 Update: No changes at this time. 05/24/24 Update:DSS continues to look for placement at this time Update 05/29/2024:  Patient remains on the unit and safe at this time.  Case was accepted by APS [facility] and they are looking for placement for the patient.   Update 06/03/2024:  No changes at this time. Update 06/08/24: No changes at this time Update 06/13/24: [facility] APS having difficulty connecting with pt's care coordinator at [facility] to assist with placement Update 06/18/24: No changes at this time Update 06/23/24: [facility] reports that she may have found placement for pt. CSW will send TB tests per her request Update 06/28/24: Arch reports pt currently awaiting approval of 1915i. Reports she should know more about placement next week.    Reason for Continuation of Hospitalization: Delusions  Medication stabilization   Estimated Length of Stay: 1 to 7 days Update 04/02/24: TBD. Update 04/07/24: TBD Update  04/12/24:TBD. Update 04/28/24:TBD Update 05/04/2024: TBD Update 05/09/2024:  TBD  05/14/24 Update: TBD. 05/19/24 Update: TBD. 05/24/24 Update: TBD  Update 05/29/2024:  TBD.  Update 06/03/2024:  TBD Update 06/08/24: TBD Update 06/13/24: TBD Update 06/18/24: TBD Update 06/23/24: TBD Update 06/28/24: TBD  Last 3 [place] Suicide Severity Risk Score: Flowsheet Row Admission (Current) from 03/27/2024 in [name] ED from 03/26/2024 in [facility] Emergency Department at [facility] ED from 03/25/2024 in [facility]  C-SSRS RISK CATEGORY No Risk No Risk No Risk    Last [facility] 2/9 Scores:     No data to display          Scribe for Treatment Team: Lum JONETTA Raynaldo ISRAEL 06/28/2024 3:47 PM

## 2024-06-28 NOTE — Group Note (Signed)
 Date:  06/28/2024 Time:  11:24 AM  Group Topic/Focus:  MOVEMENT THERAPY    Participation Level:  Did Not Attend    Benjamin [name] 06/28/2024, 11:24 AM

## 2024-06-28 NOTE — Plan of Care (Signed)
   Problem: Education: Goal: Knowledge of General Education information will improve Description Including pain rating scale, medication(s)/side effects and non-pharmacologic comfort measures Outcome: Progressing   Problem: Health Behavior/Discharge Planning: Goal: Ability to manage health-related needs will improve Outcome: Progressing

## 2024-06-28 NOTE — Plan of Care (Signed)
   Problem: Nutrition: Goal: Adequate nutrition will be maintained Outcome: Progressing   Problem: Coping: Goal: Level of anxiety will decrease Outcome: Progressing   Problem: Elimination: Goal: Will not experience complications related to bowel motility Outcome: Progressing Goal: Will not experience complications related to urinary retention Outcome: Progressing

## 2024-06-28 NOTE — Progress Notes (Signed)
 SI/HI: denies  Behavior/Mood: Pleasant and cooperative. Flat affect. Denies anxiety and depression  Interaction/Group: limited interaction with peers and staff  Medications/PRN: po med compliant/No PRNs given  Pain: denies  Other: slept 13.5 hours   06/27/24 2100  Psych Admission Type (Psych Patients Only)  Admission Status Involuntary  Psychosocial Assessment  Patient Complaints None  Eye Contact Brief  Facial Expression Flat  Affect Flat  Speech Logical/coherent  Interaction Minimal  Motor Activity Slow  Appearance/Hygiene In scrubs  Behavior Characteristics Appropriate to situation  Mood Pleasant  Thought Process  Coherency WDL  Content WDL  Delusions None reported or observed  Perception WDL  Hallucination None reported or observed  Judgment Impaired  Confusion Mild  Danger to Self  Current suicidal ideation? Denies

## 2024-06-28 NOTE — [facility] Counselor (Signed)
 CSW received notification from [name] that pt is currently awaiting 1915i to be processed before pt can be transitioned to placement.   Lum Croft, MSW, [place] 06/28/2024 2:28 PM

## 2024-06-28 NOTE — Progress Notes (Signed)
   06/28/24 1700  Psych Admission Type (Psych Patients Only)  Admission Status Involuntary  Psychosocial Assessment  Patient Complaints None  Eye Contact Fair  Facial Expression Flat  Affect Flat  Speech Logical/coherent  Interaction Minimal  Motor Activity Slow  Appearance/Hygiene In scrubs  Behavior Characteristics Appropriate to situation  Mood Pleasant  Thought Process  Coherency WDL  Content WDL  Delusions None reported or observed  Perception WDL  Hallucination None reported or observed  Judgment Impaired  Confusion Mild  Danger to Self  Current suicidal ideation? Denies  Agreement Not to Harm Self Yes  Description of Agreement verbal  Danger to Others  Danger to Others None reported or observed

## 2024-06-28 NOTE — Progress Notes (Signed)
 [facility] MD Progress Note  06/28/2024 8:31 PM Benjamin [name]  MRN:  [id number] Benjamin [name] is a 78 year old male who presents to the inpatient geriatric psych unit after jumping from a two story building. Patient was originally seen at [facility] who referred him to the ED who admitted him here on 03/27/2024. Patient reports that people from the [place] gang were trying to kill him and the only escape was through the bedroom window on the second floor. He currently lives in the house with [name] and his family and has been living with them for 3-4 years without any problems. On 03/19/2024 he went to the doctor and when he got back home the family wouldn't let him leave. He states that after he realized the gang was going to kill him he barricaded the door with his bed and jumped out the window. When he landed he ran away until one of his neighbors found him and called 911. Patient is admitted to [facility] unit with Q15 min safety monitoring. Multidisciplinary team approach is offered. Medication management; group/milieu therapy is offered.     Subjective: Chart is reviewed and discussed with the treatment team.  APS is working on legal guardianship and placement.  He has guardianship hearing June 19, 2024 Patient is noted to be sitting in his bed.  He reports adjusting to the new room and offers no complaints except for feeling cold.  He denies SI/HI/plan and denies auditory/visual hallucinations.  Per nursing he is taking his medications and is coming out of the room to participate in groups and keeping up with his nutrition Past Psychiatric History: see h&P  Family History:  Family History  Problem Relation Age of Onset   Colon cancer Neg Hx    Colon polyps Neg Hx    Stomach cancer Neg Hx    Esophageal cancer Neg Hx    Social History:  Social History   Substance and Sexual Activity  Alcohol Use No     Social History   Substance and Sexual Activity  Drug Use No     Social History   Socioeconomic History   Marital status: Single    Spouse name: Not on file   Number of children: Not on file   Years of education: Not on file   Highest education level: Not on file  Occupational History   Not on file  Tobacco Use   Smoking status: Former    Types: Cigarettes   Smokeless tobacco: Never  Vaping Use   Vaping status: Never Used  Substance and Sexual Activity   Alcohol use: No   Drug use: No   Sexual activity: Not on file  Other Topics Concern   Not on file  Social History Narrative   Not on file   Social Drivers of Health   Financial Resource Strain: Not on file  Food Insecurity: No Food Insecurity (03/27/2024)   Hunger Vital Sign    Worried About Running Out of Food in the Last Year: Never true    Ran Out of Food in the Last Year: Never true  Transportation Needs: No Transportation Needs (03/27/2024)   PRAPARE - Administrator, Civil Service (Medical): No    Lack of Transportation (Non-Medical): No  Physical Activity: Not on file  Stress: Not on file  Social Connections: Moderately Integrated (03/27/2024)   Social Connection and Isolation Panel    Frequency of Communication with Friends and Family: Twice a week    Frequency  of Social Gatherings with Friends and Family: Once a week    Attends Religious Services: 1 to 4 times per year    Active Member of [employer] or Organizations: No    Attends Engineer, Structural: 1 to 4 times per year    Marital Status: Divorced   Past Medical History:  Past Medical History:  Diagnosis Date   Allergy    Arthritis    back   Prostate cancer (HCC) dx'd 2010   surg only    Past Surgical History:  Procedure Laterality Date   COLONOSCOPY     EYE SURGERY     PROSTATE SURGERY      Current Medications: Current Facility-Administered Medications  Medication Dose Route Frequency Provider Last Rate Last Admin   acetaminophen  (TYLENOL ) tablet 650 mg  650 mg Oral Q6H PRN Coleman, Carolyn  H, NP       alum & mag hydroxide-simeth (MAALOX/MYLANTA) 200-200-20 MG/5ML suspension 30 mL  30 mL Oral Q4H PRN Coleman, Carolyn H, NP       docusate sodium  (COLACE) capsule 100 mg  100 mg Oral Daily Nishanth Mccaughan, MD   100 mg at 06/28/24 0941   fluPHENAZine  (PROLIXIN ) tablet 10 mg  10 mg Oral BID Shrivastava, Aryendra, MD   10 mg at 06/28/24 0941   Or   fluPHENAZine  (PROLIXIN ) injection 5 mg  5 mg Intramuscular BID Shrivastava, Aryendra, MD       magnesium  hydroxide (MILK OF MAGNESIA) suspension 30 mL  30 mL Oral Daily PRN Mardy Elveria DEL, NP       OLANZapine  (ZYPREXA ) injection 5 mg  5 mg Intramuscular TID PRN Mardy Elveria DEL, NP   5 mg at 04/12/24 2230   OLANZapine  zydis (ZYPREXA ) disintegrating tablet 5 mg  5 mg Oral TID PRN Mardy Elveria DEL, NP       polyethylene glycol (MIRALAX  / GLYCOLAX ) packet 17 g  17 g Oral Daily PRN Donnelly Mellow, MD   17 g at 06/04/24 2123    Lab Results:  No results found for this or any previous visit (from the past 48 hours).    Blood Alcohol level:  Lab Results  Component Value Date   [facility] <15 03/26/2024    Metabolic Disorder Labs: Lab Results  Component Value Date   HGBA1C 5.6 04/02/2024   MPG 114 04/02/2024   No results found for: PROLACTIN Lab Results  Component Value Date   CHOL 146 04/02/2024   TRIG 37 04/02/2024   HDL 43 04/02/2024   CHOLHDL 3.4 04/02/2024   VLDL 7 04/02/2024   LDLCALC 96 04/02/2024    Physical Findings: AIMS:  , ,  ,  ,    CIWA:    COWS:      Psychiatric Specialty Exam:  Presentation  General Appearance:  Appropriate for Environment; Bizarre  Eye Contact: Fair  Speech: Normal rate  Speech Volume: Normal    Mood and Affect  Mood: Fine Affect: Congruent   Thought Process  Thought Processes: Disorganized At baseline Descriptions of Associations:Intact  Orientation:Full (Time, Place and Person)  Thought Content: Paranoia at baseline Hallucinations: Denies  Ideas of  Reference: Paranoia at baseline Suicidal Thoughts: Denies  Homicidal Thoughts: Denies   Sensorium  Memory: impiared  Judgment: Impaired  Insight: Shallow   Executive Functions  Concentration: Fair  Attention Span: Fair  Recall: [employer] of Knowledge: Fair  Language: Fair   Psychomotor Activity  Psychomotor Activity: No data recorded  Musculoskeletal: Strength & Muscle Tone:  within normal limits Gait & Station: normal Assets  Assets: Manufacturing Systems Engineer; Resilience    Physical Exam: Physical Exam Vitals and nursing note reviewed.    ROS Blood pressure 110/74, pulse 63, temperature 98.3 F (36.8 C), resp. rate 18, height 6' (1.829 m), weight 100 kg, SpO2 100%. Body mass index is 29.91 kg/m.  Diagnosis: Principal Problem:   Delusional disorder [facility])  Major Neurocognitive disorder  Clinical Decision Making: Patient currently admitted after jumping off a two-story building in the context of possible delusions as reported as the people in the house he lived for 4 years trying to kill him.  Patient needs to be monitored closely for ongoing psychosis and paranoid delusions.  Given patient's confusion and chronic paranoia, no family support, APS has taken up guardianship and looking for placement     Safety and Monitoring:             -- InVoluntary admission to inpatient psychiatric unit for safety, stabilization and treatment             -- Daily contact with patient to assess and evaluate symptoms and progress in treatment             -- Patient's case to be discussed in multi-disciplinary team meeting             -- Observation Level: q15 minute checks             -- Vital signs:  q12 hours             -- Precautions: suicide, elopement, and assault   2. Psychiatric Diagnoses and Treatment:              Continue Prolixin  to 10 mg twice daily.  -- The risks/benefits/side-effects/alternatives to this medication were discussed in detail with the  patient and time was given for questions. The patient consents to medication trial.                -- Metabolic profile and EKG monitoring obtained while on an atypical antipsychotic (BMI: Lipid Panel: HbgA1c: QTc:)              -- Encouraged patient to participate in unit milieu and in scheduled group therapies                            3. Medical Issues Being Addressed:    No urgent medical needs noted  4. Discharge Planning:   -- Social work and case management to assist with discharge planning and identification of hospital follow-up needs prior to discharge  -- Estimated LOS: 3-4 days  Riannon Mukherjee, MD 06/28/2024, 8:31 PM

## 2024-06-29 DIAGNOSIS — S01112A Laceration without foreign body of left eyelid and periocular area, initial encounter: Secondary | ICD-10-CM | POA: Diagnosis not present

## 2024-06-29 DIAGNOSIS — F0390 Unspecified dementia without behavioral disturbance: Secondary | ICD-10-CM | POA: Diagnosis not present

## 2024-06-29 DIAGNOSIS — F22 Delusional disorders: Secondary | ICD-10-CM | POA: Diagnosis not present

## 2024-06-29 NOTE — Progress Notes (Signed)
 SI/HI: denies  Behavior/Mood: Calm and cooperative. Flat affect. Denies anxiety and depression.  Interaction/Group: Isolative  Medication/PRN: po med compliant/No PRNs given  Pain: denies  Other: pt slept throughout the night   06/28/24 2200  Psych Admission Type (Psych Patients Only)  Admission Status Involuntary  Psychosocial Assessment  Patient Complaints None  Eye Contact Fair  Facial Expression Flat  Affect Flat  Speech Logical/coherent  Interaction Minimal  Motor Activity Slow  Appearance/Hygiene In scrubs  Behavior Characteristics Appropriate to situation  Mood Pleasant  Thought Process  Coherency WDL  Content WDL  Delusions None reported or observed  Perception WDL  Hallucination None reported or observed  Judgment Impaired  Confusion Mild  Danger to Self  Current suicidal ideation? Denies

## 2024-06-29 NOTE — Group Note (Signed)
 Physical/Occupational Therapy Group Note  Group Topic: UE Therex   Group Date: 06/29/2024 Start Time: 1305 End Time: 1330 Facilitators: Lavonda Therisa CROME, OT   Group Description: Group instructed in series of upper extremities exercises, aimed to promote strength, flexibility, range of motion and functional endurance.  Patients provided cuing for proper mechanics and proper pace of exercise; exercises adjusted as necessary for individualized patient needs.  Patient also engaged in cognitive components throughout session, working to integrate attention to task, command following, turn-taking and appropriate social interaction throughout session.  Allowed to ask questions as appropriate, and encouraged to identify specific exercises that they could complete independently outside of group sessions.   Therapeutic Goal(s): Demonstrate appropriate performance of upper extremity exercises to promote strength, flexibility, range of motion and functional endurance Identify 2-3 specific upper extremity exercises to complete as home exercise program outside of group session    Individual Participation: pt did not attend   Participation Level:    Participation Quality:    Behavior:    Speech/Thought Process:    Affect/Mood:    Insight:    Judgement:    Modes of Intervention:   Patient Response to Interventions:     Plan: Continue to engage patient in PT/OT groups 1 - 2x/week.  06/29/2024  Therisa CROME Lavonda, OT Therisa Lavonda, OTD OTR/L  06/29/24, 3:32 PM

## 2024-06-29 NOTE — Plan of Care (Signed)
  Problem: Pain Managment: Goal: General experience of comfort will improve and/or be controlled Outcome: Progressing   Problem: Safety: Goal: Ability to remain free from injury will improve Outcome: Progressing   Problem: Health Behavior/Discharge Planning: Goal: Compliance with treatment plan for underlying cause of condition will improve Outcome: Progressing   Problem: Physical Regulation: Goal: Ability to maintain clinical measurements within normal limits will improve Outcome: Progressing

## 2024-06-29 NOTE — Plan of Care (Signed)

## 2024-06-29 NOTE — Progress Notes (Signed)
 SI/HI/AVH: denies all   Behavior/Mood: appropriate/pleasant    Interaction/Group attendance: minimal/ 0 groups   Medication/PRNs: compliant/none    Pain: denies  Other: potential placement located

## 2024-06-29 NOTE — Group Note (Signed)
 Date:  06/29/2024 Time:  8:49 PM  Group Topic/Focus:  Wrap-Up Group:   The focus of this group is to help patients review their daily goal of treatment and discuss progress on daily workbooks.    Participation Level:  Active  Participation Quality:  Appropriate  Affect:  Appropriate  Cognitive:  Alert  Insight: Appropriate  Engagement in Group:  Engaged  Modes of Intervention:  Activity  Additional Comments:    Sherrilyn JAYSON Redman 06/29/2024, 8:49 PM

## 2024-06-29 NOTE — Group Note (Signed)
 Date:  06/29/2024 Time:  1:26 PM  Group Topic/Focus:  Managing Feelings:   The focus of this group is to identify what feelings patients have difficulty handling and develop a plan to handle them in a healthier way upon discharge.    Participation Level:  Did Not Attend   Larrie Leita BRAVO 06/29/2024, 1:26 PM

## 2024-06-29 NOTE — Progress Notes (Signed)
 [facility] MD Progress Note  06/29/2024 10:54 PM Benjamin [name]  MRN:  [id number] Benjamin [name] is a 78 year old male who presents to the inpatient geriatric psych unit after jumping from a two story building. Patient was originally seen at [facility] who referred him to the ED who admitted him here on 03/27/2024. Patient reports that people from the [place] gang were trying to kill him and the only escape was through the bedroom window on the second floor. He currently lives in the house with [name] and his family and has been living with them for 3-4 years without any problems. On 03/19/2024 he went to the doctor and when he got back home the family wouldn't let him leave. He states that after he realized the gang was going to kill him he barricaded the door with his bed and jumped out the window. When he landed he ran away until one of his neighbors found him and called 911. Patient is admitted to [name] unit with Q15 min safety monitoring. Multidisciplinary team approach is offered. Medication management; group/milieu therapy is offered.     Subjective: Chart is reviewed and discussed with the treatment team.  APS is working on legal guardianship and placement.  He has guardianship hearing June 19, 2024 Patient is noted to be resting in bed.  He reports feeling cold in the room but offers no complaints.  Patient is noted to be doing well with the room change.  He is coming out of the room intermittently and participating in groups.  Even though he is minimally engaging but is cooperative.  Per nursing patient is taking medications with no reported side effects.  He denies SI/HI/plan and denies hallucinations  Past Psychiatric History: see h&P  Family History:  Family History  Problem Relation Age of Onset   Colon cancer Neg Hx    Colon polyps Neg Hx    Stomach cancer Neg Hx    Esophageal cancer Neg Hx    Social History:  Social History   Substance and Sexual Activity   Alcohol Use No     Social History   Substance and Sexual Activity  Drug Use No    Social History   Socioeconomic History   Marital status: Single    Spouse name: Not on file   Number of children: Not on file   Years of education: Not on file   Highest education level: Not on file  Occupational History   Not on file  Tobacco Use   Smoking status: Former    Types: Cigarettes   Smokeless tobacco: Never  Vaping Use   Vaping status: Never Used  Substance and Sexual Activity   Alcohol use: No   Drug use: No   Sexual activity: Not on file  Other Topics Concern   Not on file  Social History Narrative   Not on file   Social Drivers of Health   Financial Resource Strain: Not on file  Food Insecurity: No Food Insecurity (03/27/2024)   Hunger Vital Sign    Worried About Running Out of Food in the Last Year: Never true    Ran Out of Food in the Last Year: Never true  Transportation Needs: No Transportation Needs (03/27/2024)   PRAPARE - Administrator, Civil Service (Medical): No    Lack of Transportation (Non-Medical): No  Physical Activity: Not on file  Stress: Not on file  Social Connections: Moderately Integrated (03/27/2024)   Social Connection and Isolation Panel  Frequency of Communication with Friends and Family: Twice a week    Frequency of Social Gatherings with Friends and Family: Once a week    Attends Religious Services: 1 to 4 times per year    Active Member of [employer] or Organizations: No    Attends Engineer, Structural: 1 to 4 times per year    Marital Status: Divorced   Past Medical History:  Past Medical History:  Diagnosis Date   Allergy    Arthritis    back   Prostate cancer (HCC) dx'd 2010   surg only    Past Surgical History:  Procedure Laterality Date   COLONOSCOPY     EYE SURGERY     PROSTATE SURGERY      Current Medications: Current Facility-Administered Medications  Medication Dose Route Frequency Provider Last Rate  Last Admin   acetaminophen  (TYLENOL ) tablet 650 mg  650 mg Oral Q6H PRN Coleman, Carolyn H, NP       alum & mag hydroxide-simeth (MAALOX/MYLANTA) 200-200-20 MG/5ML suspension 30 mL  30 mL Oral Q4H PRN Coleman, Carolyn H, NP       docusate sodium  (COLACE) capsule 100 mg  100 mg Oral Daily Verley Pariseau, MD   100 mg at 06/29/24 0818   fluPHENAZine  (PROLIXIN ) tablet 10 mg  10 mg Oral BID Shrivastava, Aryendra, MD   10 mg at 06/29/24 2119   Or   fluPHENAZine  (PROLIXIN ) injection 5 mg  5 mg Intramuscular BID Shrivastava, Aryendra, MD       magnesium  hydroxide (MILK OF MAGNESIA) suspension 30 mL  30 mL Oral Daily PRN Mardy Elveria DEL, NP       OLANZapine  (ZYPREXA ) injection 5 mg  5 mg Intramuscular TID PRN Mardy Elveria DEL, NP   5 mg at 04/12/24 2230   OLANZapine  zydis (ZYPREXA ) disintegrating tablet 5 mg  5 mg Oral TID PRN Mardy Elveria DEL, NP       polyethylene glycol (MIRALAX  / GLYCOLAX ) packet 17 g  17 g Oral Daily PRN Donnelly Mellow, MD   17 g at 06/04/24 2123    Lab Results:  No results found for this or any previous visit (from the past 48 hours).    Blood Alcohol level:  Lab Results  Component Value Date   [facility] <15 03/26/2024    Metabolic Disorder Labs: Lab Results  Component Value Date   HGBA1C 5.6 04/02/2024   MPG 114 04/02/2024   No results found for: PROLACTIN Lab Results  Component Value Date   CHOL 146 04/02/2024   TRIG 37 04/02/2024   HDL 43 04/02/2024   CHOLHDL 3.4 04/02/2024   VLDL 7 04/02/2024   LDLCALC 96 04/02/2024    Physical Findings: AIMS:  , ,  ,  ,    CIWA:    COWS:      Psychiatric Specialty Exam:  Presentation  General Appearance:  Appropriate for Environment; Bizarre  Eye Contact: Fair  Speech: Normal rate  Speech Volume: Normal    Mood and Affect  Mood: Fine Affect: Congruent   Thought Process  Thought Processes: Disorganized At baseline Descriptions of Associations:Intact  Orientation:Full (Time, Place and  Person)  Thought Content: Paranoia at baseline Hallucinations: Denies  Ideas of Reference: Paranoia at baseline Suicidal Thoughts: Denies  Homicidal Thoughts: Denies   Sensorium  Memory: impiared  Judgment: Impaired  Insight: Shallow   Executive Functions  Concentration: Fair  Attention Span: Fair  Recall: [employer] of Knowledge: Fair  Language: Fair  Psychomotor Activity  Psychomotor Activity: No data recorded  Musculoskeletal: Strength & Muscle Tone: within normal limits Gait & Station: normal Assets  Assets: Manufacturing Systems Engineer; Resilience    Physical Exam: Physical Exam Vitals and nursing note reviewed.    ROS Blood pressure (!) 91/51, pulse 66, temperature 98.5 F (36.9 C), temperature source Oral, resp. rate 17, height 6' (1.829 m), weight 100 kg, SpO2 97%. Body mass index is 29.91 kg/m.  Diagnosis: Principal Problem:   Delusional disorder [facility])  Major Neurocognitive disorder  Clinical Decision Making: Patient currently admitted after jumping off a two-story building in the context of possible delusions as reported as the people in the house he lived for 4 years trying to kill him.  Patient needs to be monitored closely for ongoing psychosis and paranoid delusions.  Given patient's confusion and chronic paranoia, no family support, APS has taken up guardianship and looking for placement     Safety and Monitoring:             -- InVoluntary admission to inpatient psychiatric unit for safety, stabilization and treatment             -- Daily contact with patient to assess and evaluate symptoms and progress in treatment             -- Patient's case to be discussed in multi-disciplinary team meeting             -- Observation Level: q15 minute checks             -- Vital signs:  q12 hours             -- Precautions: suicide, elopement, and assault   2. Psychiatric Diagnoses and Treatment:              Continue Prolixin  to 10 mg twice  daily.  -- The risks/benefits/side-effects/alternatives to this medication were discussed in detail with the patient and time was given for questions. The patient consents to medication trial.                -- Metabolic profile and EKG monitoring obtained while on an atypical antipsychotic (BMI: Lipid Panel: HbgA1c: QTc:)              -- Encouraged patient to participate in unit milieu and in scheduled group therapies                            3. Medical Issues Being Addressed:    No urgent medical needs noted  4. Discharge Planning:   -- Social work and case management to assist with discharge planning and identification of hospital follow-up needs prior to discharge  -- Estimated LOS: 3-4 days  Korben Carcione, MD 06/29/2024, 10:54 PM

## 2024-06-29 NOTE — Group Note (Signed)
 Recreation Therapy Group Note   Group Topic:Healthy Support Systems  Group Date: 06/29/2024 Start Time: 1400 End Time: 1450 Facilitators: Celestia Jeoffrey BRAVO, LRT, CTRS Location: Courtyard  Group Description: Emotional Check in. Patient sat and talked with LRT about how they are doing and whatever else is on their mind. LRT provided active listening, reassurance and encouragement. Pts were given the opportunity to listen to music or color mandalas while they talk.    Goal Area(s) Addressed: Patient will engage in conversation with LRT. Patient will communicate their wants, needs, or questions.  Patient will practice a new coping skill of "talking to someone".   Affect/Mood: N/A   Participation Level: Did not attend    Clinical Observations/Individualized Feedback: Patient did not attend group.   Plan: Continue to engage patient in RT group sessions 2-3x/week.   Jeoffrey BRAVO Celestia, LRT, CTRS 06/29/2024 4:48 PM

## 2024-06-29 NOTE — Plan of Care (Signed)
  Problem: Pain Managment: Goal: General experience of comfort will improve and/or be controlled Outcome: Progressing   Problem: Safety: Goal: Ability to remain free from injury will improve Outcome: Progressing

## 2024-06-30 DIAGNOSIS — S01112A Laceration without foreign body of left eyelid and periocular area, initial encounter: Secondary | ICD-10-CM | POA: Diagnosis not present

## 2024-06-30 DIAGNOSIS — F0390 Unspecified dementia without behavioral disturbance: Secondary | ICD-10-CM | POA: Diagnosis not present

## 2024-06-30 DIAGNOSIS — F22 Delusional disorders: Secondary | ICD-10-CM | POA: Diagnosis not present

## 2024-06-30 NOTE — Plan of Care (Signed)
  Problem: Elimination: Goal: Will not experience complications related to bowel motility Outcome: Progressing Goal: Will not experience complications related to urinary retention Outcome: Progressing   

## 2024-06-30 NOTE — Group Note (Signed)
 Date:  06/30/2024 Time:  9:25 PM  Group Topic/Focus:  Wrap-Up Group:   The focus of this group is to help patients review their daily goal of treatment and discuss progress on daily workbooks.    Participation Level:  Active  Participation Quality:  Appropriate  Affect:  Appropriate  Cognitive:  Appropriate  Insight: Appropriate  Engagement in Group:  Engaged  Modes of Intervention:  Discussion  Additional Comments:    Beatris ONEIDA Hasten 06/30/2024, 9:25 PM

## 2024-06-30 NOTE — Group Note (Signed)
 [facility] LCSW Group Therapy Note   Group Date: 06/30/2024 Start Time: 1345 End Time: 1435   Type of Therapy/Topic:  Group Therapy:  Balance in Life  Participation Level:  Minimal   Description of Group:    This group will address the concept of balance and how it feels and looks when one is unbalanced. Patients will be encouraged to process areas in their lives that are out of balance, and identify reasons for remaining unbalanced. Facilitators will guide patients utilizing problem- solving interventions to address and correct the stressor making their life unbalanced. Understanding and applying boundaries will be explored and addressed for obtaining  and maintaining a balanced life. Patients will be encouraged to explore ways to assertively make their unbalanced needs known to significant others in their lives, using other group members and facilitator for support and feedback.  Therapeutic Goals: Patient will identify two or more emotions or situations they have that consume much of in their lives. Patient will identify signs/triggers that life has become out of balance:  Patient will identify two ways to set boundaries in order to achieve balance in their lives:  Patient will demonstrate ability to communicate their needs through discussion and/or role plays  Summary of Patient Progress: Patient participated in group.   Patient was present Patient was respectful of peers, and was present throughout the entire session.            Rexene LELON Mae, LCSWA
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
## 2024-06-30 NOTE — Progress Notes (Signed)
   06/29/24 2030  Psych Admission Type (Psych Patients Only)  Admission Status Involuntary  Psychosocial Assessment  Patient Complaints None  Eye Contact Fair  Facial Expression Flat  Affect Flat  Speech Logical/coherent  Interaction Minimal  Motor Activity Slow  Appearance/Hygiene In scrubs  Behavior Characteristics Cooperative;Appropriate to situation  Mood Pleasant  Thought Process  Coherency WDL  Content WDL  Delusions None reported or observed  Perception WDL  Hallucination None reported or observed  Judgment Impaired  Confusion Mild  Danger to Self  Current suicidal ideation? Denies  Self-Injurious Behavior No self-injurious ideation or behavior indicators observed or expressed   Agreement Not to Harm Self Yes  Description of Agreement verbal  Danger to Others  Danger to Others None reported or observed

## 2024-06-30 NOTE — Progress Notes (Signed)
 SI/HI/AVH: denies all    Behavior/Mood: appropriate/pleasant     Interaction/Group attendance: minimal/ 2 of 2 groups   Medication/PRNs: compliant/none    Pain: denies   Other: potential placement located

## 2024-06-30 NOTE — Plan of Care (Signed)
  Problem: Education: Goal: Knowledge of General Education information will improve Description: Including pain rating scale, medication(s)/side effects and non-pharmacologic comfort measures Outcome: Progressing   Problem: Health Behavior/Discharge Planning: Goal: Ability to manage health-related needs will improve Outcome: Progressing   Problem: Clinical Measurements: Goal: Ability to maintain clinical measurements within normal limits will improve Outcome: Progressing Goal: Will remain free from infection Outcome: Progressing Goal: Diagnostic test results will improve Outcome: Progressing Goal: Respiratory complications will improve Outcome: Progressing Goal: Cardiovascular complication will be avoided Outcome: Progressing   Problem: Activity: Goal: Risk for activity intolerance will decrease Outcome: Progressing   Problem: Nutrition: Goal: Adequate nutrition will be maintained Outcome: Progressing   Problem: Coping: Goal: Level of anxiety will decrease Outcome: Progressing   Problem: Elimination: Goal: Will not experience complications related to bowel motility Outcome: Progressing Goal: Will not experience complications related to urinary retention Outcome: Progressing   Problem: Pain Managment: Goal: General experience of comfort will improve and/or be controlled Outcome: Progressing   Problem: Safety: Goal: Ability to remain free from injury will improve Outcome: Progressing   Problem: Skin Integrity: Goal: Risk for impaired skin integrity will decrease Outcome: Progressing   Problem: Education: Goal: Knowledge of [place] [facility] Education information/materials will improve Outcome: Progressing Goal: Emotional status will improve Outcome: Progressing Goal: Mental status will improve Outcome: Progressing Goal: Verbalization of understanding the information provided will improve Outcome: Progressing   Problem: Activity: Goal: Interest or  engagement in activities will improve Outcome: Progressing Goal: Sleeping patterns will improve Outcome: Progressing   Problem: Coping: Goal: Ability to verbalize frustrations and anger appropriately will improve Outcome: Progressing Goal: Ability to demonstrate self-control will improve Outcome: Progressing   Problem: Health Behavior/Discharge Planning: Goal: Identification of resources available to assist in meeting health care needs will improve Outcome: Progressing Goal: Compliance with treatment plan for underlying cause of condition will improve Outcome: Progressing   Problem: Physical Regulation: Goal: Ability to maintain clinical measurements within normal limits will improve Outcome: Progressing   Problem: Safety: Goal: Periods of time without injury will increase Outcome: Progressing

## 2024-06-30 NOTE — Group Note (Signed)
 Date:  06/30/2024 Time:  12:46 PM  Group Topic/Focus:  Recovery Goals:   The focus of this group is to identify appropriate goals for recovery and establish a plan to achieve them.    Participation Level:  Minimal  Participation Quality:  Resistant  Affect:  Flat and Irritable  Cognitive:  Alert  Insight: Lacking  Engagement in Group:  Defensive and Poor  Modes of Intervention:  Discussion and Education  Additional Comments:     [name],Benjamin People E 06/30/2024, 12:46 PM

## 2024-07-01 ENCOUNTER — Inpatient Hospital Stay

## 2024-07-01 DIAGNOSIS — F0390 Unspecified dementia without behavioral disturbance: Secondary | ICD-10-CM | POA: Diagnosis not present

## 2024-07-01 DIAGNOSIS — F22 Delusional disorders: Secondary | ICD-10-CM | POA: Diagnosis not present

## 2024-07-01 DIAGNOSIS — S01112A Laceration without foreign body of left eyelid and periocular area, initial encounter: Secondary | ICD-10-CM | POA: Diagnosis not present

## 2024-07-01 LAB — GLUCOSE, CAPILLARY: Glucose-Capillary: 117 mg/dL — ABNORMAL HIGH (ref 70–99)

## 2024-07-01 NOTE — Progress Notes (Addendum)
 Hospitalist on unit to evaluate pt. See recommendations. Bandaid reapplied scant bleeding noted from laceration.

## 2024-07-01 NOTE — Hospital Course (Signed)
 Benjamin [name]

## 2024-07-01 NOTE — Progress Notes (Signed)
 Pt returned to unit. Pt in no acute distress. See follow up assessment and VS. No acute changes at this time.

## 2024-07-01 NOTE — Progress Notes (Deleted)
 07/01/24 0133  What Happened  Was fall witnessed? No  Was patient injured? Yes  Patient found in bathroom  Found by No one-pt stated they fell  Stated prior activity bathroom-unassisted  Provider Notification  Provider Name/Title Roxianne, NP  Date Provider Notified 07/01/24  Time Provider Notified 0134  Method of Notification Call (Simultaneous filing. User may not have seen previous data.)  Notification Reason Fall (Simultaneous filing. User may not have seen previous data.)  Provider response See new orders (Simultaneous filing. User may not have seen previous data.)  Date of Provider Response 07/01/24  Time of Provider Response 0138  Follow Up  Family notified Yes - comment (Pt's emergency contact was called but no answer and VM not set up)  Time family notified 0248  Additional tests Yes-comment (CT scan w/o contrast ordered)  Simple treatment Dressing  Progress note created (see row info) Yes  Adult Fall Risk Assessment  Risk Factor Category (scoring not indicated) Fall has occurred during this admission (document High fall risk)  Patient Fall Risk Level High fall risk  Adult Fall Risk Interventions  Required Bundle Interventions *See Row Information* High fall risk  Additional Interventions Reorient/diversional activities with confused patients;Other (Comment) (Pt seated in front of nurses station. CT scan ordered.)  Fall intervention(s) refused/Patient educated regarding refusal Nonskid socks;Yellow bracelet  Screening for Fall Injury Risk (To be completed on HIGH fall risk patients) - Assessing Need for Floor Mats  Risk For Fall Injury- Criteria for Floor Mats Previous fall this admission  Will Implement Floor Mats Yes  Vitals  Temp 98.7 F (37.1 C)  Temp Source Oral  BP 128/69  MAP (mmHg) 86  BP Location Right Arm  BP Method Automatic  Patient Position (if appropriate) Sitting  Pulse Rate 71  Pulse Rate Source Monitor  Resp 18  Oxygen Therapy  SpO2 100 %   O2 Device Room Air  Pain Assessment  Pain Scale 0-10  Pain Score 0      07/01/24 0125  Adult Fall Risk Assessment  Patient Fall Risk Level High fall risk  Neurological  Neuro (WDL) X (denies back pain , headaches, legs/hip/pelvis pain)  Level of Consciousness Alert (mild confusion baseline)  Orientation Level Oriented to time;Oriented to place;Oriented to person;Disoriented to situation  Cognition Poor safety awareness;Poor judgement;Memory impairment;Follows commands;Appropriate attention/concentration  Speech Clear  R Pupil Size (mm) 2  R Pupil Shape Round  R Pupil Reaction Brisk  L Pupil Size (mm) 2  L Pupil Shape Round  L Pupil Reaction Brisk  Motor Function/Sensation Assessment Head;Grip;Elbow extension;Elbow flexion;Dorsiflexion;Pronator drift;Plantar flexion;Motor strength;Sensation;Motor response  Facial Symmetry Symmetrical  R Hand Grip Strong  L Hand Grip Strong  R Foot Dorsiflexion Moderate  L Foot Dorsiflexion Moderate  R Foot Plantar Flexion Moderate  L Foot Plantar Flexion Moderate  RUE Motor Response Purposeful movement;Responds to commands  RUE Sensation Full sensation  RUE Motor Strength 5  LUE Motor Response Purposeful movement;Responds to commands  LUE Sensation Full sensation  LUE Motor Strength 5  RLE Motor Response Purposeful movement;Responds to commands  RLE Sensation Full sensation  RLE Motor Strength 5  LLE Motor Response Purposeful movement;Responds to commands  LLE Sensation Full sensation  LLE Motor Strength 5  Neuro Symptoms None  Musculoskeletal  Musculoskeletal (WDL) X  Assistive Device None  Generalized Weakness Yes  Weight Bearing Restrictions Per Provider Order No  Integumentary  Integumentary (WDL) X (cut to L eye brow bleeding post fall)  Skin Color Appropriate for ethnicity  Skin Condition Dry  Skin Integrity Abrasion  Abrasion Location Head  Abrasion Location Orientation Other (Comment) (L side of eyebrow)  Abrasion  Intervention Other (Comment) (cleansed and bandaid applied)  Skin Turgor Non-tenting

## 2024-07-01 NOTE — Group Note (Signed)
 Date:  07/01/2024 Time:  9:13 PM  Group Topic/Focus:  Wrap-Up Group:   The focus of this group is to help patients review their daily goal of treatment and discuss progress on daily workbooks.    Participation Level:  Did Not Attend   Deitra Clap [facility] 07/01/2024, 9:13 PM

## 2024-07-01 NOTE — Progress Notes (Signed)
 Patient is an involuntary admission to [facility] awaiting placement to a nursing home.  Last  night the patient fell in the bathroom - was pulling down his pants and bend over too far and his his head. CT negative. Patient denies pain. Wound closed with steri-strips and covered this morning with DSD. Patient has an order for 1:1 for 24 hours s/p fall.  Patient declines using a walker. Denies SI, HI, AVH, anxiety and depression. Will continue to monitor.

## 2024-07-01 NOTE — Progress Notes (Signed)
 During MHT safety checks per MHT pt standing at his door and reporting that he fell while in the bathroom. Per pt I was pulling my pants down and I fell and hit my head. Fall unwitnessed. On assessment pt noted to have a gash/cut on the left side of eyebrow area actively bleeding. Pt assisted in the chair near the nurses station. VS obtained. VSS. MD made aware. No significant findings other than wound to the L side of eyebrow. No swelling, bruising or redness. Neuro intact PEERLA full ROM and moderate strength in all extremities. Pt denies numbness and tingling. Pt AOx3 at this time mild confusion at baseline. Respirations even and unlabored. Denies headaches, back pain, leg/hip/pelvis pain. See flowsheet for assessment findings. No acute distress. Would cleansed and dry dressing applied during this event no bleeding visible on bandage area. Awaiting MD orders.

## 2024-07-01 NOTE — Group Note (Signed)
 Date:  07/01/2024 Time:  12:24 PM  Group Topic/Focus:  Wellness Toolbox:   The focus of this group is to discuss various aspects of wellness, balancing those aspects and exploring ways to increase the ability to experience wellness.  Patients will create a wellness toolbox for use upon discharge.    Participation Level:  Did Not Attend   Larrie Leita BRAVO 07/01/2024, 12:24 PM

## 2024-07-01 NOTE — Progress Notes (Signed)
 Provided pt with walker. Pt assisted to bathroom. Upon attempting to assist pt to toilet pt requesting to be left alone in the bathroom stating I got it. Pt refusing for staff to assist. Staff noted to stand outside pt bathroom door for safety. Pt assisted to his bed and noted laying down in no acute distress. Floor mat in place. Call light within reach pt educated on use and to call for any needs or changes in status.

## 2024-07-01 NOTE — Plan of Care (Signed)
  Problem: Education: Goal: Knowledge of General Education information will improve Description: Including pain rating scale, medication(s)/side effects and non-pharmacologic comfort measures Outcome: Progressing   Problem: Health Behavior/Discharge Planning: Goal: Ability to manage health-related needs will improve Outcome: Progressing   Problem: Clinical Measurements: Goal: Ability to maintain clinical measurements within normal limits will improve Outcome: Progressing Goal: Will remain free from infection Outcome: Progressing Goal: Diagnostic test results will improve Outcome: Progressing Goal: Respiratory complications will improve Outcome: Progressing Goal: Cardiovascular complication will be avoided Outcome: Progressing   Problem: Activity: Goal: Risk for activity intolerance will decrease Outcome: Progressing   Problem: Nutrition: Goal: Adequate nutrition will be maintained Outcome: Progressing   Problem: Coping: Goal: Level of anxiety will decrease Outcome: Progressing   Problem: Elimination: Goal: Will not experience complications related to bowel motility Outcome: Progressing Goal: Will not experience complications related to urinary retention Outcome: Progressing   Problem: Pain Managment: Goal: General experience of comfort will improve and/or be controlled Outcome: Progressing   Problem: Safety: Goal: Ability to remain free from injury will improve Outcome: Progressing   Problem: Skin Integrity: Goal: Risk for impaired skin integrity will decrease Outcome: Progressing   Problem: Education: Goal: Knowledge of [place] [facility] Education information/materials will improve Outcome: Progressing Goal: Emotional status will improve Outcome: Progressing Goal: Mental status will improve Outcome: Progressing Goal: Verbalization of understanding the information provided will improve Outcome: Progressing   Problem: Activity: Goal: Interest or  engagement in activities will improve Outcome: Progressing Goal: Sleeping patterns will improve Outcome: Progressing   Problem: Coping: Goal: Ability to verbalize frustrations and anger appropriately will improve Outcome: Progressing Goal: Ability to demonstrate self-control will improve Outcome: Progressing   Problem: Health Behavior/Discharge Planning: Goal: Identification of resources available to assist in meeting health care needs will improve Outcome: Progressing Goal: Compliance with treatment plan for underlying cause of condition will improve Outcome: Progressing   Problem: Physical Regulation: Goal: Ability to maintain clinical measurements within normal limits will improve Outcome: Progressing   Problem: Safety: Goal: Periods of time without injury will increase Outcome: Progressing

## 2024-07-01 NOTE — Progress Notes (Signed)
   06/30/24 2100  Psych Admission Type (Psych Patients Only)  Admission Status Involuntary  Psychosocial Assessment  Patient Complaints None  Eye Contact Fair  Facial Expression Flat  Affect Flat  Speech Logical/coherent  Interaction Minimal  Motor Activity Slow  Appearance/Hygiene In scrubs  Behavior Characteristics Appropriate to situation;Cooperative  Mood Pleasant  Thought Process  Coherency WDL  Content WDL  Delusions None reported or observed  Perception WDL  Hallucination None reported or observed  Judgment Impaired  Confusion Mild  Danger to Self  Current suicidal ideation? Denies  Self-Injurious Behavior No self-injurious ideation or behavior indicators observed or expressed   Agreement Not to Harm Self Yes  Description of Agreement verbal  Danger to Others  Danger to Others None reported or observed   Pt noted to have fall during shift. Floor mat in place. Falls protocol continued.

## 2024-07-01 NOTE — Progress Notes (Signed)
 [facility] MD Progress Note  07/01/2024 2:20 PM Benjamin [name]  MRN:  [id number] Benjamin [name] is a 78 year old male who presents to the inpatient geriatric psych unit after jumping from a two story building. Patient was originally seen at [facility] who referred him to the ED who admitted him here on 03/27/2024. Patient reports that people from the [place] gang were trying to kill him and the only escape was through the bedroom window on the second floor. He currently lives in the house with [name] and his family and has been living with them for 3-4 years without any problems. On 03/19/2024 he went to the doctor and when he got back home the family wouldn't let him leave. He states that after he realized the gang was going to kill him he barricaded the door with his bed and jumped out the window. When he landed he ran away until one of his neighbors found him and called 911. Patient is admitted to [facility] unit with Q15 min safety monitoring. Multidisciplinary team approach is offered. Medication management; group/milieu therapy is offered.     Subjective: Chart is reviewed and discussed with the treatment team.  APS is working on legal guardianship and placement.  He has guardianship hearing June 19, 2024  Per nursing report patient had a fall hit his head last night.  Per report patient woke up to go to the bathroom forward to pee reportedly he Immediately and he hit the head on the commode.  CT head revealed no acute problems.  Patient is oriented and identified the provider have his meals.  Patient currently has one-to-one sitter Past Psychiatric History: see h&P  Family History:  Family History  Problem Relation Age of Onset   Colon cancer Neg Hx    Colon polyps Neg Hx    Stomach cancer Neg Hx    Esophageal cancer Neg Hx    Social History:  Social History   Substance and Sexual Activity  Alcohol Use No     Social History   Substance and Sexual Activity  Drug Use  No    Social History   Socioeconomic History   Marital status: Single    Spouse name: Not on file   Number of children: Not on file   Years of education: Not on file   Highest education level: Not on file  Occupational History   Not on file  Tobacco Use   Smoking status: Former    Types: Cigarettes   Smokeless tobacco: Never  Vaping Use   Vaping status: Never Used  Substance and Sexual Activity   Alcohol use: No   Drug use: No   Sexual activity: Not on file  Other Topics Concern   Not on file  Social History Narrative   Not on file   Social Drivers of Health   Financial Resource Strain: Not on file  Food Insecurity: No Food Insecurity (03/27/2024)   Hunger Vital Sign    Worried About Running Out of Food in the Last Year: Never true    Ran Out of Food in the Last Year: Never true  Transportation Needs: No Transportation Needs (03/27/2024)   PRAPARE - Administrator, Civil Service (Medical): No    Lack of Transportation (Non-Medical): No  Physical Activity: Not on file  Stress: Not on file  Social Connections: Moderately Integrated (03/27/2024)   Social Connection and Isolation Panel    Frequency of Communication with Friends and Family: Twice a week  Frequency of Social Gatherings with Friends and Family: Once a week    Attends Religious Services: 1 to 4 times per year    Active Member of [employer] or Organizations: No    Attends Engineer, Structural: 1 to 4 times per year    Marital Status: Divorced   Past Medical History:  Past Medical History:  Diagnosis Date   Allergy    Arthritis    back   Prostate cancer (HCC) dx'd 2010   surg only    Past Surgical History:  Procedure Laterality Date   COLONOSCOPY     EYE SURGERY     PROSTATE SURGERY      Current Medications: Current Facility-Administered Medications  Medication Dose Route Frequency Provider Last Rate Last Admin   acetaminophen  (TYLENOL ) tablet 650 mg  650 mg Oral Q6H PRN Coleman,  Carolyn H, NP       alum & mag hydroxide-simeth (MAALOX/MYLANTA) 200-200-20 MG/5ML suspension 30 mL  30 mL Oral Q4H PRN Coleman, Carolyn H, NP       docusate sodium  (COLACE) capsule 100 mg  100 mg Oral Daily Thayer Inabinet, MD   100 mg at 07/01/24 9182   fluPHENAZine  (PROLIXIN ) tablet 10 mg  10 mg Oral BID Shrivastava, Aryendra, MD   10 mg at 07/01/24 0816   Or   fluPHENAZine  (PROLIXIN ) injection 5 mg  5 mg Intramuscular BID Shrivastava, Aryendra, MD       magnesium  hydroxide (MILK OF MAGNESIA) suspension 30 mL  30 mL Oral Daily PRN Mardy Elveria DEL, NP       OLANZapine  (ZYPREXA ) injection 5 mg  5 mg Intramuscular TID PRN Mardy Elveria DEL, NP   5 mg at 04/12/24 2230   OLANZapine  zydis (ZYPREXA ) disintegrating tablet 5 mg  5 mg Oral TID PRN Coleman, Carolyn H, NP       polyethylene glycol (MIRALAX  / GLYCOLAX ) packet 17 g  17 g Oral Daily PRN Donnelly Mellow, MD   17 g at 06/04/24 2123    Lab Results:  No results found for this or any previous visit (from the past 48 hours).    Blood Alcohol level:  Lab Results  Component Value Date   [facility] <15 03/26/2024    Metabolic Disorder Labs: Lab Results  Component Value Date   HGBA1C 5.6 04/02/2024   MPG 114 04/02/2024   No results found for: PROLACTIN Lab Results  Component Value Date   CHOL 146 04/02/2024   TRIG 37 04/02/2024   HDL 43 04/02/2024   CHOLHDL 3.4 04/02/2024   VLDL 7 04/02/2024   LDLCALC 96 04/02/2024    Physical Findings: AIMS:  , ,  ,  ,    CIWA:    COWS:      Psychiatric Specialty Exam:  Presentation  General Appearance:  Appropriate for Environment; Bizarre  Eye Contact: Fair  Speech: Normal rate  Speech Volume: Normal    Mood and Affect  Mood: Fine Affect: Congruent   Thought Process  Thought Processes: Disorganized At baseline Descriptions of Associations:Intact  Orientation:Full (Time, Place and Person)  Thought Content: Paranoia at baseline Hallucinations: Denies  Ideas  of Reference: Paranoia at baseline Suicidal Thoughts: Denies  Homicidal Thoughts: Denies   Sensorium  Memory: impiared  Judgment: Impaired  Insight: Shallow   Executive Functions  Concentration: Fair  Attention Span: Fair  Recall: [employer] of Knowledge: Fair  Language: Fair   Psychomotor Activity  Psychomotor Activity: No data recorded  Musculoskeletal: Strength & Muscle  Tone: within normal limits Gait & Station: normal Assets  Assets: Manufacturing Systems Engineer; Resilience    Physical Exam: Physical Exam Vitals and nursing note reviewed.    ROS Blood pressure 114/77, pulse 67, temperature 98 F (36.7 C), resp. rate 18, height 6' (1.829 m), weight 100 kg, SpO2 100%. Body mass index is 29.91 kg/m.  Diagnosis: Principal Problem:   Delusional disorder (HCC) Active Problems:   Laceration of left eyebrow  Major Neurocognitive disorder  Clinical Decision Making: Patient currently admitted after jumping off a two-story building in the context of possible delusions as reported as the people in the house he lived for 4 years trying to kill him.  Patient needs to be monitored closely for ongoing psychosis and paranoid delusions.  Given patient's confusion and chronic paranoia, no family support, APS has taken up guardianship and looking for placement     Safety and Monitoring:             -- InVoluntary admission to inpatient psychiatric unit for safety, stabilization and treatment             -- Daily contact with patient to assess and evaluate symptoms and progress in treatment             -- Patient's case to be discussed in multi-disciplinary team meeting             -- Observation Level: q15 minute checks             -- Vital signs:  q12 hours             -- Precautions: suicide, elopement, and assault   2. Psychiatric Diagnoses and Treatment:              Continue Prolixin  to 10 mg twice daily.  -- The risks/benefits/side-effects/alternatives to  this medication were discussed in detail with the patient and time was given for questions. The patient consents to medication trial.                -- Metabolic profile and EKG monitoring obtained while on an atypical antipsychotic (BMI: Lipid Panel: HbgA1c: QTc:)              -- Encouraged patient to participate in unit milieu and in scheduled group therapies                            3. Medical Issues Being Addressed:    Fall on/8/25, CT head normal  4. Discharge Planning:   -- Social work and case management to assist with discharge planning and identification of hospital follow-up needs prior to discharge  -- Estimated LOS: 3-4 days  Allyn Foil, MD 07/01/2024, 2:20 PM

## 2024-07-01 NOTE — Consult Note (Addendum)
 Initial Consultation Note   Patient: Benjamin [name] FMW:[id number] DOB: 1946-01-01 PCP: Benjamine Aland, MD DOA: 03/27/2024 DOS: the patient was seen and examined on 07/01/2024 Primary service: Donnelly Mellow, MD  Referring physician: Green, NP Reason for consult: Forehead laceration  Assessment/Plan: Assessment and Plan:   Laceration left eyebrow from accidental fall -No evidence of active bleeding -CT head nonacute - Clean wound and cover with gauze pending arrangements for repair -Wound looks amenable to skin glue/Steri-Strips but might benefit from sutures - Recommend surgical consult for laceration repair or arrange through nursing for laceration repair in the ED -Pain control -Update tetanus -Neurologic checks, fall precautions -Recommendations communicated to primary team  In-hospital fall -CT head negative for acute intracranial injury -Physical exam not suggestive of occult injury - No evidence of focal neurologic deficit to warrant further workup for nonaccidental fall - Continue to monitor patient for any change in neurologic status - Fall precautions    TRH will sign off at present, please call us  again when needed.  HPI: Benjamin [name] is a 78 y.o. male with past medical history of Prostate cancer, arthritis, hospitalized on geropsych since August being seen in consultation for forehead laceration sustained when he accidentally fall when he got up from his bed.  He did not lose consciousness.  He got up on his own and has been ambulating without difficulty.  Fall protocol was instituted.  They have already done CT head which was unremarkable.    Review of Systems  All other systems reviewed and are negative.   Past Medical History:  Diagnosis Date   Allergy    Arthritis    back   Prostate cancer (HCC) dx'd 2010   surg only   Past Surgical History:  Procedure Laterality Date   COLONOSCOPY     EYE SURGERY     PROSTATE SURGERY     Social History:   reports that he has quit smoking. His smoking use included cigarettes. He has never used smokeless tobacco. He reports that he does not drink alcohol and does not use drugs.  No Known Allergies  Family History  Problem Relation Age of Onset   Colon cancer Neg Hx    Colon polyps Neg Hx    Stomach cancer Neg Hx    Esophageal cancer Neg Hx     Prior to Admission medications   Medication Sig Start Date End Date Taking? Authorizing Provider  docusate sodium  (COLACE) 100 MG capsule Take 100 mg by mouth daily.    [provider]  omega-3 acid ethyl esters (LOVAZA) 1 g capsule Take 1 g by mouth daily. 03/20/24   [provider]    Physical Exam: Vitals:   06/30/24 0716 06/30/24 1922 07/01/24 0133 07/01/24 0242  BP: 114/76 103/67 128/69 97/71  Pulse: 70 71 71 77  Resp: 18 16 18 18   Temp: (!) 97.2 F (36.2 C) 98.7 F (37.1 C) 98.7 F (37.1 C) (!) 97.2 F (36.2 C)  TempSrc:   Oral   SpO2: 100% 99% 100% 99%  Weight:      Height:       Physical Exam Vitals and nursing note reviewed.  Constitutional:      General: He is not in acute distress.    Comments: Patient is awake and alert and denies complaints  HENT:     Head: Normocephalic.     Comments: 2 cm laceration lateral edge of left eyebrow.  See pic Cardiovascular:     Rate and Rhythm: Normal  rate and regular rhythm.     Heart sounds: Normal heart sounds.  Pulmonary:     Effort: Pulmonary effort is normal.     Breath sounds: Normal breath sounds.  Abdominal:     Palpations: Abdomen is soft.     Tenderness: There is no abdominal tenderness.  Musculoskeletal:     Cervical back: Full passive range of motion without pain. No pain with movement or spinous process tenderness.  Neurological:     Mental Status: Mental status is at baseline.     Data Reviewed: Relevant notes from primary care and specialist visits, past discharge summaries as available in EHR, including Care Everywhere. Prior diagnostic  testing as pertinent to current admission diagnoses Updated medications and problem lists for reconciliation ED course, including vitals, labs, imaging, treatment and response to treatment Triage notes, nursing and pharmacy notes and ED provider's notes Notable results as noted in HPI   Family Communication: none Primary team communication: NP Bobbitt Thank you very much for involving us  in the care of your patient.  Author: Delayne LULLA Solian, MD 07/01/2024 2:58 AM  For on call review [email or web address].

## 2024-07-01 NOTE — Progress Notes (Signed)
 Pt off the unit for CT scan escorted by MHT and security to CT.

## 2024-07-01 NOTE — Progress Notes (Signed)
 07/01/24 0133  What Happened  Was fall witnessed? No  Was patient injured? Yes  Patient found in bathroom  Found by No one-pt stated they fell  Stated prior activity bathroom-unassisted  Provider Notification  Provider Name/Title Roxianne, NP  Date Provider Notified 07/01/24  Time Provider Notified 0134  Method of Notification Call  Notification Reason Fall  Provider response See new orders  Date of Provider Response 07/01/24  Time of Provider Response 0138  Follow Up  Family notified Yes - comment (Pt's emergency contact was called but no answer and VM not set up)  Time family notified 0248  Additional tests Yes-comment (CT scan w/o contrast ordered)  Simple treatment Dressing  Progress note created (see row info) Yes  Adult Fall Risk Assessment  Risk Factor Category (scoring not indicated) Fall has occurred during this admission (document High fall risk)  Patient Fall Risk Level High fall risk  Adult Fall Risk Interventions  Required Bundle Interventions *See Row Information* High fall risk  Additional Interventions Reorient/diversional activities with confused patients;Other (Comment) (Pt seated in front of nurses station. CT scan ordered.)  Fall intervention(s) refused/Patient educated regarding refusal Nonskid socks;Yellow bracelet  Screening for Fall Injury Risk (To be completed on HIGH fall risk patients) - Assessing Need for Floor Mats  Risk For Fall Injury- Criteria for Floor Mats Confusion/dementia (+NuDESC, CIWA, TBI, etc.) (Simultaneous filing. User may not have seen previous data.)  Will Implement Floor Mats Yes (Simultaneous filing. User may not have seen previous data.)  Vitals  Temp 98.7 F (37.1 C)  Temp Source Oral  BP 128/69  MAP (mmHg) 86  BP Location Right Arm  BP Method Automatic  Patient Position (if appropriate) Sitting  Pulse Rate 71  Pulse Rate Source Monitor  Resp 18  Oxygen Therapy  SpO2 100 %  O2 Device Room Air  Pain Assessment   Pain Scale 0-10  Pain Score 0             07/01/24 0125  Adult Fall Risk Assessment  Patient Fall Risk Level High fall risk  Neurological  Neuro (WDL) X (denies back pain , headaches, legs/hip/pelvis pain)  Level of Consciousness Alert (mild confusion baseline)  Orientation Level Oriented to time;Oriented to place;Oriented to person;Disoriented to situation  Cognition Poor safety awareness;Poor judgement;Memory impairment;Follows commands;Appropriate attention/concentration  Speech Clear  R Pupil Size (mm) 2  R Pupil Shape Round  R Pupil Reaction Brisk  L Pupil Size (mm) 2  L Pupil Shape Round  L Pupil Reaction Brisk  Motor Function/Sensation Assessment Head;Grip;Elbow extension;Elbow flexion;Dorsiflexion;Pronator drift;Plantar flexion;Motor strength;Sensation;Motor response  Facial Symmetry Symmetrical  R Hand Grip Strong  L Hand Grip Strong  R Foot Dorsiflexion Moderate  L Foot Dorsiflexion Moderate  R Foot Plantar Flexion Moderate  L Foot Plantar Flexion Moderate  RUE Motor Response Purposeful movement;Responds to commands  RUE Sensation Full sensation  RUE Motor Strength 5  LUE Motor Response Purposeful movement;Responds to commands  LUE Sensation Full sensation  LUE Motor Strength 5  RLE Motor Response Purposeful movement;Responds to commands  RLE Sensation Full sensation  RLE Motor Strength 5  LLE Motor Response Purposeful movement;Responds to commands  LLE Sensation Full sensation  LLE Motor Strength 5  Neuro Symptoms None  Musculoskeletal  Musculoskeletal (WDL) X  Assistive Device None  Generalized Weakness Yes  Weight Bearing Restrictions Per Provider Order No  Integumentary  Integumentary (WDL) X (cut to L eye brow bleeding post fall)  Skin Color Appropriate for  ethnicity  Skin Condition Dry  Skin Integrity Other (Comment) (Laceration L side of eyebrow cleansed and bandaid applied)  Skin Turgor Non-tenting

## 2024-07-02 DIAGNOSIS — F22 Delusional disorders: Secondary | ICD-10-CM | POA: Diagnosis not present

## 2024-07-02 DIAGNOSIS — F0390 Unspecified dementia without behavioral disturbance: Secondary | ICD-10-CM | POA: Diagnosis not present

## 2024-07-02 DIAGNOSIS — S01112A Laceration without foreign body of left eyelid and periocular area, initial encounter: Secondary | ICD-10-CM | POA: Diagnosis not present

## 2024-07-02 NOTE — Group Note (Signed)
 Physical/Occupational Therapy Group Note  Group Topic: Yoga  Group Date: 07/02/2024 Start Time: 1300 End Time: 1330 Facilitators: Ritter Helsley, Alm Hamilton, PT   Group Description: Group participated with series of yoga poses, designed to emphasize functional standing balance, core stability, generalized flexibility and overall posture.  Incorporated deep breathing techniques with poses, working to promote relaxation, mindfulness and focus with targeted activities.   Discussed benefits of yoga in improving mood and self-esteem, reducing stress and anxiety, and promoting functional strength and balance for each participant.  Discussed ways to integrate into each participant's daily routine.  Provided handout with written and pictorial descriptions of included yoga movements to be utilized as appropriate outside of group time.  Therapeutic Goal(s):  Demonstrate safe ability to participate with yoga poses during group activity. Identify one benefit of participation with yoga poses as part of each participant's exercise/movement routine. Identify 1-2 individual poses that participant feels most beneficial to his/her needs and that he/she can easily replicate outside of group.  Individual Participation: Did not attend  Participation Level:   Participation Quality:   Behavior:   Speech/Thought Process:   Affect/Mood:   Insight:   Judgement:   Modes of Intervention:   Patient Response to Interventions:    Plan: Continue to engage patient in PT/OT groups 1 - 2x/week.  CHARM Hamilton Bertin PT, DPT 07/02/24, 1:54 PM

## 2024-07-02 NOTE — Plan of Care (Signed)
   Problem: Education: Goal: Knowledge of General Education information will improve Description: Including pain rating scale, medication(s)/side effects and non-pharmacologic comfort measures Outcome: Progressing   Problem: Coping: Goal: Level of anxiety will decrease Outcome: Progressing

## 2024-07-02 NOTE — Progress Notes (Signed)
   07/02/24 1300  Psych Admission Type (Psych Patients Only)  Admission Status Involuntary  Psychosocial Assessment  Patient Complaints None  Eye Contact Brief  Facial Expression Flat  Affect Flat  Speech Logical/coherent  Interaction Minimal  Motor Activity Slow  Appearance/Hygiene In scrubs  Behavior Characteristics Appropriate to situation  Mood Pleasant  Thought Process  Coherency WDL  Content WDL  Delusions None reported or observed  Perception WDL  Hallucination None reported or observed  Judgment Impaired  Confusion Mild  Danger to Self  Current suicidal ideation? Denies  Agreement Not to Harm Self Yes  Description of Agreement verbal  Danger to Others  Danger to Others None reported or observed
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
## 2024-07-02 NOTE — Progress Notes (Signed)
   07/01/24 2000  Psych Admission Type (Psych Patients Only)  Admission Status Involuntary  Psychosocial Assessment  Patient Complaints None  Eye Contact Brief  Facial Expression Flat  Affect Flat  Speech Logical/coherent  Interaction Minimal  Motor Activity Slow  Appearance/Hygiene In scrubs  Behavior Characteristics Appropriate to situation  Mood Pleasant  Thought Process  Coherency WDL  Content WDL  Delusions None reported or observed  Perception WDL  Hallucination None reported or observed  Judgment Impaired  Confusion Mild  Danger to Self  Current suicidal ideation? Denies  Self-Injurious Behavior No self-injurious ideation or behavior indicators observed or expressed   Agreement Not to Harm Self Yes  Description of Agreement Verbal  Danger to Others  Danger to Others None reported or observed

## 2024-07-02 NOTE — Group Note (Signed)
 Recreation Therapy Group Note   Group Topic:Emotion Expression  Group Date: 07/02/2024 Start Time: 1500 End Time: 1605 Facilitators: Celestia Jeoffrey BRAVO, LRT, CTRS Location: Dayroom  Group Description: Positivity Collage. LRT and patients discussed the importance of having a positive mindset and being happy. Patients received magazines, safety scissors, a glue stick and a piece of paper. Pts were encouraged to find images or words in the magazines that showed "happiness" or positivity to them. Pt shared their collage with the group once they were finished. LRT and pts discussed how it can be difficult to always have a positive mindset, especially when they have mental health challenges.   Goal Area(s) Addressed:  Pt will identify things associate with positivity. Pt will reduce negative thinking. Pt will identify a new coping skill of thinking positive thoughts.    Affect/Mood: N/A   Participation Level: Did not attend    Clinical Observations/Individualized Feedback: Patient did not attend group.   Plan: Continue to engage patient in RT group sessions 2-3x/week.   Jeoffrey BRAVO Celestia, LRT, CTRS 07/02/2024 5:07 PM

## 2024-07-02 NOTE — Group Note (Signed)
 Date:  07/02/2024 Time:  10:56 AM  Group Topic/Focus:  Goals Group/Meditation Group:   The focus of this group is to help patients establish daily goals to achieve during treatment and discuss how the patient can incorporate goal setting into their daily lives to aide in recovery. Also patients listened to a 10 minute meditation video and reflected to themselves.     Participation Level:  Did Not Attend  Beatris ONEIDA Hasten 07/02/2024, 10:56 AM

## 2024-07-02 NOTE — Group Note (Deleted)
 Recreation Therapy Group Note   Group Topic:Emotion Expression  Group Date: 07/02/2024 Start Time: 1500 End Time: 1605 Facilitators: Celestia Jeoffrey BRAVO, LRT Location: Dayroom  Group Description: Painting a Diplomatic Services Operational Officer. Patients and LRT discuss what it means to be "at peace", what it feels like physically and mentally. Pts are given a canvas and watercolor paint to use and encouraged to draw their idea of a peaceful place. Pts and LRT discuss how they use this in their daily life post discharge. Pts are encouraged to take their canvas home with them as a reminder to find their peaceful place whenever they are feeling depressed, anxious, etc.    Goal Area(s) Addressed:  Patient will identify what it means to experience a "peaceful" emotion. Patient will identify a new coping skill.  Patient will express their emotions through art. Patients will increase communication by talking with LRT and peers while in group.       Affect/Mood: {RT BHH Affect/Mood:26271}   Participation Level: {RT BHH Participation Level:26267}   Participation Quality: {RT BHH Participation Quality:26268}   Behavior: {RT BHH Group Behavior:26269}   Speech/Thought Process: {RT BHH Speech/Thought:26276}   Insight: {RT BHH Insight:26272}   Judgement: {RT BHH Judgement:26278}   Modes of Intervention: {RT BHH Modes of Intervention:26277}   Patient Response to Interventions:  {RT BHH Patient Response to Intervention:26274}   Education Outcome:  {RT BHH Education Outcome:26279}   Clinical Observations/Individualized Feedback: *** was *** in their participation of session activities and group discussion. Pt identified ***   Plan: {RT BHH Tx Eojw:73719}   Jeoffrey BRAVO Celestia, LRT,  07/02/2024 4:55 PM

## 2024-07-03 DIAGNOSIS — F0390 Unspecified dementia without behavioral disturbance: Secondary | ICD-10-CM | POA: Diagnosis not present

## 2024-07-03 DIAGNOSIS — S01112A Laceration without foreign body of left eyelid and periocular area, initial encounter: Secondary | ICD-10-CM | POA: Diagnosis not present

## 2024-07-03 DIAGNOSIS — F22 Delusional disorders: Secondary | ICD-10-CM | POA: Diagnosis not present

## 2024-07-03 LAB — COMPREHENSIVE METABOLIC PANEL WITH GFR
ALT: 24 U/L (ref 0–44)
AST: 23 U/L (ref 15–41)
Albumin: 3.8 g/dL (ref 3.5–5.0)
Alkaline Phosphatase: 59 U/L (ref 38–126)
Anion gap: 8 (ref 5–15)
BUN: 18 mg/dL (ref 8–23)
CO2: 30 mmol/L (ref 22–32)
Calcium: 9.7 mg/dL (ref 8.9–10.3)
Chloride: 103 mmol/L (ref 98–111)
Creatinine, Ser: 0.89 mg/dL (ref 0.61–1.24)
GFR, Estimated: >60 mL/min (ref >60)
Glucose, Bld: 104 mg/dL — ABNORMAL HIGH (ref 70–99)
Potassium: 4.5 mmol/L (ref 3.5–5.1)
Sodium: 141 mmol/L (ref 135–145)
Total Bilirubin: 0.3 mg/dL (ref 0.0–1.2)
Total Protein: 7.1 g/dL (ref 6.5–8.1)

## 2024-07-03 LAB — CBC WITH DIFFERENTIAL/PLATELET
Abs Immature Granulocytes: 0.02 K/uL (ref 0.00–0.07)
Basophils Absolute: 0 K/uL (ref 0.0–0.1)
Basophils Relative: 0 %
Eosinophils Absolute: 0.1 K/uL (ref 0.0–0.5)
Eosinophils Relative: 2 %
HCT: 38.9 % — ABNORMAL LOW (ref 39.0–52.0)
Hemoglobin: 12.5 g/dL — ABNORMAL LOW (ref 13.0–17.0)
Immature Granulocytes: 0 %
Lymphocytes Relative: 28 %
Lymphs Abs: 1.6 K/uL (ref 0.7–4.0)
MCH: 29.4 pg (ref 26.0–34.0)
MCHC: 32.1 g/dL (ref 30.0–36.0)
MCV: 91.5 fL (ref 80.0–100.0)
Monocytes Absolute: 0.6 K/uL (ref 0.1–1.0)
Monocytes Relative: 10 %
Neutro Abs: 3.4 K/uL (ref 1.7–7.7)
Neutrophils Relative %: 60 %
Platelets: 221 K/uL (ref 150–400)
RBC: 4.25 MIL/uL (ref 4.22–5.81)
RDW: 12.8 % (ref 11.5–15.5)
WBC: 5.6 K/uL (ref 4.0–10.5)
nRBC: 0 % (ref 0.0–0.2)

## 2024-07-03 LAB — URINALYSIS, COMPLETE (UACMP) WITH MICROSCOPIC
Bacteria, UA: NONE SEEN
Bilirubin Urine: NEGATIVE
Glucose, UA: NEGATIVE mg/dL
Hgb urine dipstick: NEGATIVE
Ketones, ur: NEGATIVE mg/dL
Leukocytes,Ua: NEGATIVE
Nitrite: NEGATIVE
Protein, ur: NEGATIVE mg/dL
Specific Gravity, Urine: 1.021 (ref 1.005–1.030)
pH: 6 (ref 5.0–8.0)

## 2024-07-03 NOTE — Group Note (Signed)
 Date:  07/03/2024 Time:  9:28 PM  Group Topic/Focus:  Wrap-Up Group:   The focus of this group is to help patients review their daily goal of treatment and discuss progress on daily workbooks.    Participation Level:  Did Not Attend  Participation Quality:     Affect:     Cognitive:     Insight: None  Engagement in Group:  None  Modes of Intervention:     Additional Comments:    Benjamin [name] [name] 07/03/2024, 9:28 PM

## 2024-07-03 NOTE — BH IP Treatment Plan (Signed)
 Interdisciplinary Treatment and Diagnostic Plan Update  07/03/2024 Time of Session: 3:00 PM  Benjamin [name] MRN: [id number]  Principal Diagnosis: Delusional disorder [facility])  Secondary Diagnoses: Principal Problem:   Delusional disorder (HCC) Active Problems:   Laceration of left eyebrow   Current Medications:  Current Facility-Administered Medications  Medication Dose Route Frequency Provider Last Rate Last Admin   acetaminophen  (TYLENOL ) tablet 650 mg  650 mg Oral Q6H PRN Coleman, Carolyn H, NP       alum & mag hydroxide-simeth (MAALOX/MYLANTA) 200-200-20 MG/5ML suspension 30 mL  30 mL Oral Q4H PRN Coleman, Carolyn H, NP       docusate sodium  (COLACE) capsule 100 mg  100 mg Oral Daily Jadapalle, Sree, MD   100 mg at 07/03/24 9176   fluPHENAZine  (PROLIXIN ) tablet 10 mg  10 mg Oral BID Shrivastava, Aryendra, MD   10 mg at 07/03/24 9175   Or   fluPHENAZine  (PROLIXIN ) injection 5 mg  5 mg Intramuscular BID Shrivastava, Aryendra, MD       magnesium  hydroxide (MILK OF MAGNESIA) suspension 30 mL  30 mL Oral Daily PRN Coleman, Carolyn H, NP       OLANZapine  (ZYPREXA ) injection 5 mg  5 mg Intramuscular TID PRN Mardy Elveria DEL, NP   5 mg at 04/12/24 2230   OLANZapine  zydis (ZYPREXA ) disintegrating tablet 5 mg  5 mg Oral TID PRN Coleman, Carolyn H, NP       polyethylene glycol (MIRALAX  / GLYCOLAX ) packet 17 g  17 g Oral Daily PRN Jadapalle, Sree, MD   17 g at 06/04/24 2123   PTA Medications: Medications Prior to Admission  Medication Sig Dispense Refill Last Dose/Taking   docusate sodium  (COLACE) 100 MG capsule Take 100 mg by mouth daily.      omega-3 acid ethyl esters (LOVAZA) 1 g capsule Take 1 g by mouth daily.       Patient Stressors: Traumatic event    Patient Strengths: Communication skills   Treatment Modalities: Medication Management, Group therapy, Case management,  1 to 1 session with clinician, Psychoeducation, Recreational therapy.   Physician Treatment Plan for  Primary Diagnosis: Delusional disorder [facility]) Long Term Goal(s): Improvement in symptoms so as ready for discharge   Short Term Goals: Ability to identify changes in lifestyle to reduce recurrence of condition will improve Ability to verbalize feelings will improve Ability to disclose and discuss suicidal ideas Ability to demonstrate self-control will improve Ability to identify and develop effective coping behaviors will improve  Medication Management: Evaluate patient's response, side effects, and tolerance of medication regimen.  Therapeutic Interventions: 1 to 1 sessions, Unit Group sessions and Medication administration.  Evaluation of Outcomes: Progressing  Physician Treatment Plan for Secondary Diagnosis: Principal Problem:   Delusional disorder (HCC) Active Problems:   Laceration of left eyebrow  Long Term Goal(s): Improvement in symptoms so as ready for discharge   Short Term Goals: Ability to identify changes in lifestyle to reduce recurrence of condition will improve Ability to verbalize feelings will improve Ability to disclose and discuss suicidal ideas Ability to demonstrate self-control will improve Ability to identify and develop effective coping behaviors will improve     Medication Management: Evaluate patient's response, side effects, and tolerance of medication regimen.  Therapeutic Interventions: 1 to 1 sessions, Unit Group sessions and Medication administration.  Evaluation of Outcomes: Progressing   RN Treatment Plan for Primary Diagnosis: Delusional disorder (HCC) Long Term Goal(s): Knowledge of disease and therapeutic regimen to maintain health will improve  Short Term  Goals: Ability to remain free from injury will improve, Ability to verbalize frustration and anger appropriately will improve, Ability to demonstrate self-control, Ability to participate in decision making will improve, Ability to verbalize feelings will improve, Ability to disclose and  discuss suicidal ideas, Ability to identify and develop effective coping behaviors will improve, and Compliance with prescribed medications will improve  Medication Management: RN will administer medications as ordered by provider, will assess and evaluate patient's response and provide education to patient for prescribed medication. RN will report any adverse and/or side effects to prescribing provider.  Therapeutic Interventions: 1 on 1 counseling sessions, Psychoeducation, Medication administration, Evaluate responses to treatment, Monitor vital signs and CBGs as ordered, Perform/monitor CIWA, COWS, AIMS and Fall Risk screenings as ordered, Perform wound care treatments as ordered.  Evaluation of Outcomes: Progressing   LCSW Treatment Plan for Primary Diagnosis: Delusional disorder [facility]) Long Term Goal(s): Safe transition to appropriate next level of care at discharge, Engage patient in therapeutic group addressing interpersonal concerns.  Short Term Goals: Engage patient in aftercare planning with referrals and resources, Increase social support, Increase ability to appropriately verbalize feelings, Increase emotional regulation, Facilitate acceptance of mental health diagnosis and concerns, Facilitate patient progression through stages of change regarding substance use diagnoses and concerns, Identify triggers associated with mental health/substance abuse issues, and Increase skills for wellness and recovery  Therapeutic Interventions: Assess for all discharge needs, 1 to 1 time with Social worker, Explore available resources and support systems, Assess for adequacy in community support network, Educate family and significant other(s) on suicide prevention, Complete Psychosocial Assessment, Interpersonal group therapy.  Evaluation of Outcomes: Progressing   Progress in Treatment: Attending groups: Yes. and No. Participating in groups: Yes. and No. Taking medication as prescribed:  Yes. Toleration medication: Yes. Family/Significant other contact made: Yes, individual(s) contacted:  [name], borther, [phone]  Patient understands diagnosis: No. Discussing patient identified problems/goals with staff: Yes. Medical problems stabilized or resolved: Yes. Denies suicidal/homicidal ideation: Yes. Issues/concerns per patient self-inventory: No. Other: None   New problem(s) identified: No, Describe:  05/14/24 Update: None  05/24/24 Update: No changes at this time.  Update 05/29/2024:  No changes at this time.  Update 06/03/2024:  No changes at this time. Update 06/08/24: No changes at this time Update 06/13/24: No changes at this time  Update 06/18/24: No changes at this time Update 06/23/24: No changes at this time  Update 06/28/24: No changes at this time Update 07/03/24: No changes at this time          New Short Term/Long Term Goal(s): elimination of symptoms of psychosis, medication management for mood stabilization; elimination of SI thoughts; development of comprehensive mental wellness plan. Update 04/02/24: No changes at this time. Update 04/07/24: No changes at this time. Update 04/12/24: No changes at this time Update 04/18/24: No changes at this time  Update 04/23/24: No changes at this time. Update 04/28/2024: No changes at this time.  Update 05/04/2024: No changes at this time.  Update 05/09/2024:  No changes at this time. 05/14/24 Update: No changes at this time. 05/19/24 Update: No changes at this time. 05/24/24 Update: No changes at this time. Update 05/29/2024:  No changes at this time. Update 06/03/2024:  No changes at this time. Update 06/08/24: No changes at this time Update 06/13/24: No changes at this time Update 06/18/24: No changes at this time Update 06/23/24: No changes at this time Update 06/28/24: No changes at this time  Update 07/03/24: No changes at this  time    Patient Goals:  Get out of here, I want to get healed up so I can get out of here Update  04/02/24: No changes at this time. Update 04/07/24: No changes at this time .Update 04/12/24: No changes at this time Update 04/18/24: No changes at this time  Update 04/23/24: No changes at this time. Update 04/28/2024: No changes at this time. Update 05/04/2024: No changes at this time.   Update 05/29/2024:  No changes at this time.  Update 06/03/2024:  No changes at this time. Update 06/08/24: No changes at this time Update 06/13/24: No changes at this time Update 06/18/24: No changes at this time Update 06/23/24: No changes at this time Update 06/28/24: No changes at this time  Update 07/03/24: No changes at this time    Discharge Plan or Barriers: CSW will assist with appropriate discharge planning  Update 04/02/24: No changes at this time. Update 04/07/24: No changes at this time.Update 04/12/24: No changes at this time Update 04/18/24: CSW submitted report to APS. Care team looking at guardianship for pt at this time  Update 04/23/24: No changes at this time. Update 04/28/2024: No changes at this time. Update 05/04/2024: No changes at this time.  Update 05/09/2024:  [facility] APS continues to search for placement. 05/14/24 Update: [facility] APS continues to look for placement. CSW to continue to assess. 05/19/24 Update: No changes at this time. 05/24/24 Update:DSS continues to look for placement at this time Update 05/29/2024:  Patient remains on the unit and safe at this time.  Case was accepted by APS [facility] and they are looking for placement for the patient.   Update 06/03/2024:  No changes at this time. Update 06/08/24: No changes at this time Update 06/13/24: [facility] APS having difficulty connecting with pt's care coordinator at [name] to assist with placement Update 06/18/24: No changes at this time Update 06/23/24: Arch reports that she may have found placement for pt. CSW will send TB tests per her request Update 06/28/24: Arch reports pt currently awaiting approval of 1915i.  Reports she should know more about placement next week.  Update 07/03/24: No changes at this time    Reason for Continuation of Hospitalization: Delusions  Medication stabilization   Estimated Length of Stay: 1 to 7 days Update 04/02/24: TBD. Update 04/07/24: TBD Update 04/12/24:TBD. Update 04/28/24:TBD Update 05/04/2024: TBD Update 05/09/2024:  TBD  05/14/24 Update: TBD. 05/19/24 Update: TBD. 05/24/24 Update: TBD  Update 05/29/2024:  TBD.  Update 06/03/2024:  TBD Update 06/08/24: TBD Update 06/13/24: TBD Update 06/18/24: TBD Update 06/23/24: TBD Update 06/28/24: TBD Update 07/03/24: TBD  Last 3 [place] Suicide Severity Risk Score: Flowsheet Row Admission (Current) from 03/27/2024 in [facility] ED from 03/26/2024 in [facility] Emergency Department at [facility] ED from 03/25/2024 in [facility]  C-SSRS RISK CATEGORY No Risk No Risk No Risk    Last [facility] 2/9 Scores:     No data to display          Scribe for Treatment Team: Lum JONETTA Raynaldo ISRAEL 07/03/2024 3:34 PM

## 2024-07-03 NOTE — Progress Notes (Signed)
   07/03/24 1143  Psych Admission Type (Psych Patients Only)  Admission Status Involuntary  Psychosocial Assessment  Patient Complaints None  Eye Contact Fair  Facial Expression Flat  Affect Flat  Speech Logical/coherent  Interaction Minimal  Motor Activity Slow  Appearance/Hygiene In scrubs  Behavior Characteristics Appropriate to situation  Mood Pleasant  Thought Process  Coherency WDL  Content WDL  Delusions None reported or observed  Perception WDL  Hallucination None reported or observed  Judgment Impaired  Confusion Mild  Danger to Self  Current suicidal ideation? Denies  Agreement Not to Harm Self Yes  Description of Agreement verbal  Danger to Others  Danger to Others None reported or observed   Pt Continues on 1:1 for safety.   Safety maintained.

## 2024-07-03 NOTE — Plan of Care (Signed)
  Problem: Clinical Measurements: Goal: Cardiovascular complication will be avoided Outcome: Progressing   Problem: Activity: Goal: Risk for activity intolerance will decrease Outcome: Progressing   Problem: Elimination: Goal: Will not experience complications related to bowel motility Outcome: Progressing   

## 2024-07-03 NOTE — Progress Notes (Signed)
 1:1 NOTE  Patient in bed sleeping with Sitter by bedside all fall protocol in place. Will continue to monitor.

## 2024-07-03 NOTE — Progress Notes (Signed)
 1:1 NOTE  Patient continued on 1:1 for safety. Sitter by Patient bedside for falls. Compliant with medications no adverse effects noted. Pt remains safe.

## 2024-07-03 NOTE — Group Note (Signed)
 Recreation Therapy Group Note   Group Topic:Relaxation  Group Date: 07/03/2024 Start Time: 1100 End Time: 1125 Facilitators: Celestia Jeoffrey BRAVO, LRT, CTRS Location: Dayroom  Group Description: Meditation. LRT and patients discussed what they know about meditation and mindfulness. LRT played a Deep Breathing Meditation exercise script for patients to follow along to. LRT and patients discussed how meditation and deep breathing can be used as a coping skill post--discharge to help manage symptoms of stress.   Goal Area(s) Addressed: Patient will practice using relaxation technique. Patient will identify a new coping skill.  Patient will follow multistep directions to reduce anxiety and stress.   Affect/Mood: N/A   Participation Level: Did not attend    Clinical Observations/Individualized Feedback: Patient did not attend group.   Plan: Continue to engage patient in RT group sessions 2-3x/week.   Jeoffrey BRAVO Celestia, LRT, CTRS 07/03/2024 2:09 PM

## 2024-07-03 NOTE — Group Note (Signed)
 Recreation Therapy Group Note   Group Topic:Leisure Education  Group Date: 07/03/2024 Start Time: 1505 End Time: 1550 Facilitators: Celestia Jeoffrey BRAVO, LRT, CTRS Location: Dayroom  Group Description: Bingo. Patients played multiple rounds of bingo. LRT and pts discussed the definition of leisure, things they do in their free time outside of the hospital, and how bingo is also a leisure activity. Pts received soda as their bingo prize.   Goal Area(s) Addressed:  Patient will identify a current leisure interest.  Patient will learn the definition of "leisure". Patient will have the opportunity to try a new leisure activity. Patient will communicate with peers and LRT.   Affect/Mood: N/A   Participation Level: Did not attend    Clinical Observations/Individualized Feedback: Patient did not attend group.   Plan: Continue to engage patient in RT group sessions 2-3x/week.   Jeoffrey BRAVO Celestia, LRT, CTRS 07/03/2024 5:08 PM
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
## 2024-07-03 NOTE — Progress Notes (Signed)
 1:1 NOTE  Patient in bed sleeping respirations noted no S/S of distress. Sitter by Patient bedside. Patient ambulating with front wheel walker. Support ongoing. All fall protocol in place.

## 2024-07-03 NOTE — Group Note (Unsigned)
 LCSW Group Therapy Note  Group Date: 07/03/2024 Start Time: 1330 End Time: 1400   Type of Therapy and Topic:  Group Therapy: Positive Affirmations  Participation Level:  {BHH PARTICIPATION OZCZO:[id number]}   Description of Group:   This group addressed positive affirmation towards self and others.  Patients went around the room and identified two positive things about themselves and two positive things about a peer in the room.  Patients reflected on how it felt to share something positive with others, to identify positive things about themselves, and to hear positive things from others/ Patients were encouraged to have a daily reflection of positive characteristics or circumstances.   Therapeutic Goals: 1. Patients will verbalize two of their positive qualities 2. Patients will demonstrate empathy for others by stating two positive qualities about a peer in the group 3. Patients will verbalize their feelings when voicing positive self affirmations and when voicing positive affirmations of others 4. Patients will discuss the potential positive impact on their wellness/recovery of focusing on positive traits of self and others.  Summary of Patient Progress:  *** actively engaged in the discussion and . S*** was able***or not able to identify positive affirmations about ***self as well as other group members. Patient demonstrated *** insight into the subject matter, was respectful of peers, participated throughout the entire session.  Therapeutic Modalities:   Cognitive Behavioral Therapy Motivational Interviewing    [name], LCSWA 07/03/2024  2:11 PM

## 2024-07-03 NOTE — Progress Notes (Signed)
 Pt remains on 1:1 for safety.   Pt is awake and alert to person and place.  Denies pain and has been getting up to eat meals in dayroom without incident.  Staff remains beside him for fall safety.

## 2024-07-03 NOTE — Group Note (Signed)
 Date:  07/03/2024 Time:  11:02 AM  Group Topic/Focus:  Personal Choices and Values:   The focus of this group is to help patients assess and explore the importance of values in their lives, how their values affect their decisions, how they express their values and what opposes their expression.    Participation Level:  Did Not Attend   Benjamin [name] 07/03/2024, 11:02 AM

## 2024-07-03 NOTE — Evaluation (Signed)
 Physical Therapy Evaluation Patient Details Name: Benjamin [name] MRN: [id number] DOB: June 26, 1946 Today's Date: 07/03/2024  History of Present Illness  Pt is a 78 y/o M admitted on 03/27/24 after jumping from a 2 story building. PT has been consulted after pt experienced a fall. PMH: arthritis, prostate CA  Clinical Impression  Pt seen for PT evaluation with pt agreeable, pleasant throughout session. Pt reports a recent fall in the bathroom when pulling pants below hips & leaning too far forward thus falling. PT encouraged pt to hold to grab bar for increased balance when toileting. Pt demonstrates decreased high level balance during session. Will follow pt to address this.        If plan is discharge home, recommend the following: A little help with walking and/or transfers;A little help with bathing/dressing/bathroom   Can travel by private vehicle        Equipment Recommendations Other (comment) (TBD)  Recommendations for Other Services       Functional Status Assessment Patient has had a recent decline in their functional status and demonstrates the ability to make significant improvements in function in a reasonable and predictable amount of time.     Precautions / Restrictions Precautions Precautions: Fall Restrictions Weight Bearing Restrictions Per Provider Order: No      Mobility  Bed Mobility Overal bed mobility: Independent                  Transfers Overall transfer level: Independent                      Ambulation/Gait Ambulation/Gait assistance: Supervision, Contact guard assist Gait Distance (Feet): 150 Feet Assistive device: None Gait Pattern/deviations: Step-through pattern, Decreased step length - right, Decreased step length - left, Decreased stride length Gait velocity: decreased     General Gait Details: occasional slight increased weight shift to L during gait without AD, pt performed speed changes, stop/starts without LOB but  demonstrated slightly impaired balance with head turns  Careers Information Officer     Tilt Bed    Modified Rankin (Stroke Patients Only)       Balance Overall balance assessment: Needs assistance Sitting-balance support: Feet supported Sitting balance-Leahy Scale: Good     Standing balance support: During functional activity, No upper extremity supported Standing balance-Leahy Scale: Fair                               Pertinent Vitals/Pain Pain Assessment Pain Assessment: No/denies pain    Home Living Family/patient expects to be discharged to:: Unsure                        Prior Function Prior Level of Function : Independent/Modified Independent             Mobility Comments: ambulatory without AD       Extremity/Trunk Assessment   Upper Extremity Assessment Upper Extremity Assessment: Overall WFL for tasks assessed    Lower Extremity Assessment Lower Extremity Assessment: Overall WFL for tasks assessed    Cervical / Trunk Assessment Cervical / Trunk Assessment:  (laceration above L eye, L eye doesn't open as much as R eye)  Communication   Communication Factors Affecting Communication:  (speaks at low volume)    Cognition Arousal: Alert Behavior During Therapy: Flat affect   PT - Cognitive impairments: No apparent  impairments                       PT - Cognition Comments: pleasant throughout session Following commands: Intact       Cueing Cueing Techniques: Verbal cues     General Comments General comments (skin integrity, edema, etc.): Pt performed 5x sit<>stand from lowest setting of high/low bed with uneven floor mat underneath BLE with CGA with minimal posterior lean onto EOB without BUE support.    Exercises     Assessment/Plan    PT Assessment Patient needs continued PT services  PT Problem List Decreased mobility;Decreased balance;Decreased knowledge of use of DME       PT  Treatment Interventions DME instruction;Balance training;Gait training;Neuromuscular re-education;Stair training;Functional mobility training;Therapeutic activities;Therapeutic exercise;Patient/family education    PT Goals (Current goals can be found in the Care Plan section)  Acute Rehab PT Goals Patient Stated Goal: none stated PT Goal Formulation: With patient Time For Goal Achievement: 07/17/24 Potential to Achieve Goals: Good Additional Goals Additional Goal #1: Pt will score >/= 52/56 on Berg Balance Test to demonstrate decreased fall risk with mobility.    Frequency Min 2X/week     Co-evaluation               AM-PAC PT 6 Clicks Mobility  Outcome Measure Help needed turning from your back to your side while in a flat bed without using bedrails?: None Help needed moving from lying on your back to sitting on the side of a flat bed without using bedrails?: None Help needed moving to and from a bed to a chair (including a wheelchair)?: None Help needed standing up from a chair using your arms (e.g., wheelchair or bedside chair)?: None Help needed to walk in hospital room?: A Little Help needed climbing 3-5 steps with a railing? : A Little 6 Click Score: 22    End of Session   Activity Tolerance: Patient tolerated treatment well Patient left: in bed Nurse Communication: Mobility status PT Visit Diagnosis: Unsteadiness on feet (R26.81);History of falling (Z91.81)    Time: 8440-8391 PT Time Calculation (min) (ACUTE ONLY): 9 min   Charges:   PT Evaluation $PT Eval Low Complexity: 1 Low   PT General Charges $$ ACUTE PT VISIT: 1 Visit         Richerd Pinal, PT, DPT 07/03/24, 4:18 PM   Richerd CHRISTELLA Pinal 07/03/2024, 4:17 PM

## 2024-07-04 DIAGNOSIS — S01112A Laceration without foreign body of left eyelid and periocular area, initial encounter: Secondary | ICD-10-CM | POA: Diagnosis not present

## 2024-07-04 DIAGNOSIS — F22 Delusional disorders: Secondary | ICD-10-CM | POA: Diagnosis not present

## 2024-07-04 DIAGNOSIS — F0390 Unspecified dementia without behavioral disturbance: Secondary | ICD-10-CM | POA: Diagnosis not present

## 2024-07-04 NOTE — Plan of Care (Signed)

## 2024-07-04 NOTE — Plan of Care (Signed)
  Problem: Education: Goal: Knowledge of General Education information will improve Description: Including pain rating scale, medication(s)/side effects and non-pharmacologic comfort measures Outcome: Progressing   Problem: Health Behavior/Discharge Planning: Goal: Ability to manage health-related needs will improve Outcome: Progressing   Problem: Clinical Measurements: Goal: Ability to maintain clinical measurements within normal limits will improve Outcome: Progressing Goal: Will remain free from infection Outcome: Progressing Goal: Diagnostic test results will improve Outcome: Progressing Goal: Respiratory complications will improve Outcome: Progressing Goal: Cardiovascular complication will be avoided Outcome: Progressing   Problem: Activity: Goal: Risk for activity intolerance will decrease Outcome: Progressing   Problem: Nutrition: Goal: Adequate nutrition will be maintained Outcome: Progressing   Problem: Coping: Goal: Level of anxiety will decrease Outcome: Progressing   Problem: Elimination: Goal: Will not experience complications related to bowel motility Outcome: Progressing Goal: Will not experience complications related to urinary retention Outcome: Progressing   Problem: Pain Managment: Goal: General experience of comfort will improve and/or be controlled Outcome: Progressing   Problem: Safety: Goal: Ability to remain free from injury will improve Outcome: Progressing   Problem: Skin Integrity: Goal: Risk for impaired skin integrity will decrease Outcome: Progressing   Problem: Education: Goal: Knowledge of [place] [facility] Education information/materials will improve Outcome: Progressing Goal: Emotional status will improve Outcome: Progressing Goal: Mental status will improve Outcome: Progressing Goal: Verbalization of understanding the information provided will improve Outcome: Progressing   Problem: Activity: Goal: Interest or  engagement in activities will improve Outcome: Progressing Goal: Sleeping patterns will improve Outcome: Progressing   Problem: Coping: Goal: Ability to verbalize frustrations and anger appropriately will improve Outcome: Progressing Goal: Ability to demonstrate self-control will improve Outcome: Progressing   Problem: Health Behavior/Discharge Planning: Goal: Identification of resources available to assist in meeting health care needs will improve Outcome: Progressing Goal: Compliance with treatment plan for underlying cause of condition will improve Outcome: Progressing   Problem: Physical Regulation: Goal: Ability to maintain clinical measurements within normal limits will improve Outcome: Progressing   Problem: Safety: Goal: Periods of time without injury will increase Outcome: Progressing

## 2024-07-04 NOTE — Group Note (Signed)
 Date:  07/04/2024 Time:  11:36 AM  Group Topic/Focus:  Crafts, Word Search    Participation Level:  Did Not Attend  Participation Quality:  n/a  Affect:  n/a  Cognitive:  n/a  Insight: None  Engagement in Group:  n/a  Modes of Intervention:  n/a  Additional Comments:  did not attend  Jeyson Deshotel E Tulani Kidney 07/04/2024, 11:36 AM

## 2024-07-04 NOTE — Group Note (Signed)
 Date:  07/04/2024 Time:  4:01 PM  Group Topic/Focus:  Group Exercise for Seniors     Participation Level:  Did Not Attend  Participation Quality:  patient did not attend  Affect:  patient did not attend  Cognitive:  patient did not attend  Insight: patient did not attend  Engagement in Group:  patient did not attend  Modes of Intervention:  patient did not attend  Additional Comments:  patient did not attend  Sanjeev Main E Earland Reish 07/04/2024, 4:01 PM

## 2024-07-04 NOTE — BHH Counselor (Signed)
 CSW received return call from [name] who reports currently the 1915i is still processing for pt.   Arch reports she reached out the placement that accepted pt and they report they are not willing to take pt without the 1915i being processed.   Arch reports she will begin looking for pt a placement that will take his Medicare and Medicaid at this point while she continue to wait on the 1915i to process.   CSW also provided updates about pt's recent fall.   CSW will inform care team of updates about pt's placement.   Lum Croft, MSW, [place] 07/04/2024 11:40 AM

## 2024-07-04 NOTE — Progress Notes (Signed)
   07/03/24 2100  Psych Admission Type (Psych Patients Only)  Admission Status Involuntary  Psychosocial Assessment  Patient Complaints None  Eye Contact Fair  Facial Expression Flat  Affect Flat  Speech Logical/coherent  Interaction Minimal  Motor Activity Slow  Appearance/Hygiene In scrubs  Behavior Characteristics Appropriate to situation  Mood Pleasant  Thought Process  Coherency WDL  Content WDL  Delusions None reported or observed  Perception WDL  Hallucination None reported or observed  Judgment Impaired  Confusion Mild  Danger to Self  Current suicidal ideation? Denies  Self-Injurious Behavior No self-injurious ideation or behavior indicators observed or expressed   Agreement Not to Harm Self Yes  Description of Agreement verbal  Danger to Others  Danger to Others None reported or observed

## 2024-07-04 NOTE — Progress Notes (Signed)
   07/04/24 1100  Psych Admission Type (Psych Patients Only)  Admission Status Involuntary  Psychosocial Assessment  Patient Complaints None  Eye Contact Fair  Facial Expression Flat  Affect Flat  Speech Logical/coherent  Interaction Minimal  Motor Activity Slow  Appearance/Hygiene In scrubs  Behavior Characteristics Appropriate to situation  Mood Pleasant  Thought Process  Coherency WDL  Content WDL  Delusions None reported or observed  Perception WDL  Hallucination None reported or observed  Judgment Impaired  Confusion Mild  Danger to Self  Current suicidal ideation? Denies  Agreement Not to Harm Self Yes  Description of Agreement verbal  Danger to Others  Danger to Others None reported or observed
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
## 2024-07-04 NOTE — Plan of Care (Signed)
 Problem: Education: Goal: Knowledge of General Education information will improve Description: Including pain rating scale, medication(s)/side effects and non-pharmacologic comfort measures 07/04/2024 0605 by Geneva Steward CROME, RN Outcome: Progressing 07/04/2024 0603 by Geneva Steward CROME, RN Outcome: Progressing   Problem: Health Behavior/Discharge Planning: Goal: Ability to manage health-related needs will improve 07/04/2024 0605 by Geneva Steward CROME, RN Outcome: Progressing 07/04/2024 0603 by Geneva Steward CROME, RN Outcome: Progressing   Problem: Clinical Measurements: Goal: Ability to maintain clinical measurements within normal limits will improve 07/04/2024 0605 by Geneva Steward CROME, RN Outcome: Progressing 07/04/2024 0603 by Geneva Steward CROME, RN Outcome: Progressing Goal: Will remain free from infection 07/04/2024 0605 by Geneva Steward CROME, RN Outcome: Progressing 07/04/2024 0603 by Geneva Steward CROME, RN Outcome: Progressing Goal: Diagnostic test results will improve 07/04/2024 0605 by Geneva Steward CROME, RN Outcome: Progressing 07/04/2024 0603 by Geneva Steward CROME, RN Outcome: Progressing Goal: Respiratory complications will improve 07/04/2024 0605 by Geneva Steward CROME, RN Outcome: Progressing 07/04/2024 0603 by Geneva Steward CROME, RN Outcome: Progressing Goal: Cardiovascular complication will be avoided 07/04/2024 0605 by Geneva Steward CROME, RN Outcome: Progressing 07/04/2024 0603 by Geneva Steward CROME, RN Outcome: Progressing   Problem: Activity: Goal: Risk for activity intolerance will decrease 07/04/2024 0605 by Geneva Steward CROME, RN Outcome: Progressing 07/04/2024 0603 by Geneva Steward CROME, RN Outcome: Progressing   Problem: Nutrition: Goal: Adequate nutrition will be maintained 07/04/2024 0605 by Geneva Steward CROME, RN Outcome: Progressing 07/04/2024 0603 by Geneva Steward CROME, RN Outcome: Progressing   Problem: Coping: Goal: Level of anxiety will decrease 07/04/2024 0605 by Geneva Steward CROME, RN Outcome: Progressing 07/04/2024 0603 by Geneva Steward CROME, RN Outcome: Progressing   Problem: Elimination: Goal: Will not experience complications related to bowel motility 07/04/2024 0605 by Geneva Steward CROME, RN Outcome: Progressing 07/04/2024 0603 by Geneva Steward CROME, RN Outcome: Progressing Goal: Will not experience complications related to urinary retention 07/04/2024 0605 by Geneva Steward CROME, RN Outcome: Progressing 07/04/2024 0603 by Geneva Steward CROME, RN Outcome: Progressing   Problem: Pain Managment: Goal: General experience of comfort will improve and/or be controlled 07/04/2024 0605 by Geneva Steward CROME, RN Outcome: Progressing 07/04/2024 0603 by Geneva Steward CROME, RN Outcome: Progressing   Problem: Safety: Goal: Ability to remain free from injury will improve 07/04/2024 0605 by Geneva Steward CROME, RN Outcome: Progressing 07/04/2024 0603 by Geneva Steward CROME, RN Outcome: Progressing   Problem: Skin Integrity: Goal: Risk for impaired skin integrity will decrease 07/04/2024 0605 by Geneva Steward CROME, RN Outcome: Progressing 07/04/2024 0603 by Geneva Steward CROME, RN Outcome: Progressing   Problem: Education: Goal: Knowledge of [facility] Education information/materials will improve 07/04/2024 0605 by Geneva Steward CROME, RN Outcome: Progressing 07/04/2024 0603 by Geneva Steward CROME, RN Outcome: Progressing Goal: Emotional status will improve 07/04/2024 0605 by Geneva Steward CROME, RN Outcome: Progressing 07/04/2024 0603 by Geneva Steward CROME, RN Outcome: Progressing Goal: Mental status will improve 07/04/2024 0605 by Geneva Steward CROME, RN Outcome: Progressing 07/04/2024 0603 by Geneva Steward CROME, RN Outcome: Progressing Goal: Verbalization of understanding the information provided will improve 07/04/2024 0605 by Geneva Steward CROME, RN Outcome: Progressing 07/04/2024 0603 by Geneva Steward CROME, RN Outcome: Progressing   Problem: Activity: Goal: Interest or engagement in  activities will improve 07/04/2024 0605 by Geneva Steward CROME, RN Outcome: Progressing 07/04/2024 0603 by Geneva Steward CROME, RN Outcome: Progressing Goal: Sleeping patterns will improve 07/04/2024 0605 by Geneva Steward CROME, RN Outcome: Progressing 07/04/2024 0603 by Geneva Steward CROME, RN Outcome: Progressing  Problem: Coping: Goal: Ability to verbalize frustrations and anger appropriately will improve 07/04/2024 0605 by Geneva Steward CROME, RN Outcome: Progressing 07/04/2024 0603 by Geneva Steward CROME, RN Outcome: Progressing Goal: Ability to demonstrate self-control will improve 07/04/2024 0605 by Geneva Steward CROME, RN Outcome: Progressing 07/04/2024 0603 by Geneva Steward CROME, RN Outcome: Progressing   Problem: Health Behavior/Discharge Planning: Goal: Identification of resources available to assist in meeting health care needs will improve 07/04/2024 0605 by Geneva Steward CROME, RN Outcome: Progressing 07/04/2024 0603 by Geneva Steward CROME, RN Outcome: Progressing Goal: Compliance with treatment plan for underlying cause of condition will improve 07/04/2024 0605 by Geneva Steward CROME, RN Outcome: Progressing 07/04/2024 0603 by Geneva Steward CROME, RN Outcome: Progressing   Problem: Physical Regulation: Goal: Ability to maintain clinical measurements within normal limits will improve 07/04/2024 0605 by Geneva Steward CROME, RN Outcome: Progressing 07/04/2024 0603 by Geneva Steward CROME, RN Outcome: Progressing   Problem: Safety: Goal: Periods of time without injury will increase 07/04/2024 0605 by Geneva Steward CROME, RN Outcome: Progressing 07/04/2024 0603 by Geneva Steward CROME, RN Outcome: Progressing

## 2024-07-04 NOTE — Progress Notes (Signed)
   07/04/24 2100  Psych Admission Type (Psych Patients Only)  Admission Status Involuntary  Psychosocial Assessment  Patient Complaints None  Eye Contact Fair  Facial Expression Flat  Affect Flat  Speech Logical/coherent  Interaction Minimal  Motor Activity Slow  Appearance/Hygiene In scrubs  Behavior Characteristics Appropriate to situation  Mood Pleasant  Thought Process  Coherency WDL  Content WDL  Delusions None reported or observed  Perception WDL  Hallucination None reported or observed  Judgment Impaired  Confusion Mild  Danger to Self  Current suicidal ideation? Denies  Agreement Not to Harm Self Yes  Description of Agreement verbal  Danger to Others  Danger to Others None reported or observed

## 2024-07-05 DIAGNOSIS — S01112A Laceration without foreign body of left eyelid and periocular area, initial encounter: Secondary | ICD-10-CM | POA: Diagnosis not present

## 2024-07-05 DIAGNOSIS — F0390 Unspecified dementia without behavioral disturbance: Secondary | ICD-10-CM | POA: Diagnosis not present

## 2024-07-05 DIAGNOSIS — F22 Delusional disorders: Secondary | ICD-10-CM | POA: Diagnosis not present

## 2024-07-05 NOTE — Plan of Care (Signed)
   Problem: Education: Goal: Knowledge of General Education information will improve Description Including pain rating scale, medication(s)/side effects and non-pharmacologic comfort measures Outcome: Progressing   Problem: Health Behavior/Discharge Planning: Goal: Ability to manage health-related needs will improve Outcome: Progressing

## 2024-07-05 NOTE — Group Note (Signed)
 Recreation Therapy Group Note   Group Topic:Relaxation  Group Date: 07/05/2024 Start Time: 1100 End Time: 1130 Facilitators: Celestia Jeoffrey BRAVO, LRT, CTRS Location: Dayroom  Group Description: PMR (Progressive Muscle Relaxation). LRT educates patients on what PMR is and the benefits that come from it. Patients are asked to sit with their feet flat on the floor while sitting up and all the way back in their chair, if possible. LRT and pts follow a prompt through a speaker that requires you to tense and release different muscles in their body and focus on their breathing. During session, lights are off and soft music is being played. Pts are given a stress ball to use if needed.   Goal Area(s) Addressed:  Patients will be able to describe progressive muscle relaxation.  Patient will practice using relaxation technique. Patient will identify a new coping skill.  Patient will follow multistep directions to reduce anxiety and stress.   Affect/Mood: N/A   Participation Level: Did not attend    Clinical Observations/Individualized Feedback: Patient did not attend group.   Plan: Continue to engage patient in RT group sessions 2-3x/week.   Jeoffrey BRAVO Celestia, LRT, CTRS 07/05/2024 1:29 PM

## 2024-07-05 NOTE — Group Note (Signed)
 Recreation Therapy Group Note   Group Topic:General Recreation  Group Date: 07/05/2024 Start Time: 1500 End Time: 1535 Facilitators: Celestia Jeoffrey BRAVO, LRT, CTRS Location: Courtyard  Group Description: Outdoor Recreation. Patients had the option to play corn hole, ring toss, UNO, or listening to music while outside in the courtyard getting fresh air and sunlight. Patients helped water and prune the raised garden beds. LRT and patients discussed things that they enjoy doing in their free time outside of the hospital. LRT encouraged patients to drink water after being active and getting their heart rate up.   Goal Area(s) Addressed: Patient will identify leisure interests.  Patient will practice healthy decision making. Patient will engage in recreation activity.   Affect/Mood: N/A   Participation Level: Did not attend    Clinical Observations/Individualized Feedback: Patient did not attend group.   Plan: Continue to engage patient in RT group sessions 2-3x/week.   Jeoffrey BRAVO Celestia, LRT, CTRS 07/05/2024 4:08 PM
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
## 2024-07-05 NOTE — Progress Notes (Signed)
   07/05/24 1600  Psych Admission Type (Psych Patients Only)  Admission Status Involuntary  Psychosocial Assessment  Patient Complaints None  Eye Contact Fair  Facial Expression Flat  Affect Sad  Speech Logical/coherent;Soft  Interaction Minimal  Motor Activity Slow  Appearance/Hygiene In scrubs  Behavior Characteristics Appropriate to situation  Mood Pleasant  Thought Process  Coherency WDL  Content WDL  Delusions None reported or observed  Perception WDL  Hallucination None reported or observed  Judgment Impaired  Confusion Mild  Danger to Self  Current suicidal ideation?  (denies)  Agreement Not to Harm Self Yes  Description of Agreement verbal  Danger to Others  Danger to Others None reported or observed

## 2024-07-05 NOTE — Group Note (Signed)
 LCSW Group Therapy Note  Group Date: 07/05/2024 Start Time: 1330 End Time: 1400   Type of Therapy and Topic:  Group Therapy - Healthy vs Unhealthy Coping Skills  Participation Level:  Did Not Attend   Description of Group The focus of this group was to determine what unhealthy coping techniques typically are used by group members and what healthy coping techniques would be helpful in coping with various problems. Patients were guided in becoming aware of the differences between healthy and unhealthy coping techniques. Patients were asked to identify 2-3 healthy coping skills they would like to learn to use more effectively.  Therapeutic Goals Patients learned that coping is what human beings do all day long to deal with various situations in their lives Patients defined and discussed healthy vs unhealthy coping techniques Patients identified their preferred coping techniques and identified whether these were healthy or unhealthy Patients determined 2-3 healthy coping skills they would like to become more familiar with and use more often. Patients provided support and ideas to each other   Summary of Patient Progress:  X   Therapeutic Modalities Cognitive Behavioral Therapy Motivational Interviewing  [name], [place] 07/05/2024  2:23 PM

## 2024-07-05 NOTE — Group Note (Signed)
 Date:  07/05/2024 Time:  2:21 PM  Group Topic/Focus:  Crisis Planning:   The purpose of this group is to help patients create a crisis plan for use upon discharge or in the future, as needed.    Participation Level:  Did Not Attend  Arland Nutting 07/05/2024, 2:21 PM

## 2024-07-05 NOTE — Progress Notes (Signed)
 Patient ate half of his night time snack and had this author leave the remaining food in the dayroom. Patient uses his walker and is unsteady at times. Vital signs were check and was assessed by his nurse.

## 2024-07-06 NOTE — Group Note (Signed)
 Date:  07/06/2024 Time:  10:28 AM  Group Topic/Focus:  Movement Therapy    Participation Level:  Did Not Attend    Benjamin [name] 07/06/2024, 10:28 AM

## 2024-07-06 NOTE — Group Note (Signed)
 Recreation Therapy Group Note   Group Topic:Emotion Expression  Group Date: 07/06/2024 Start Time: 1500 End Time: 1545 Facilitators: Celestia Jeoffrey BRAVO, LRT, CTRS Location: Courtyard  Group Description: Music. Patients are encouraged to name their favorite song(s) for LRT to play song through speaker for group to hear, while in the courtyard getting fresh air and sunlight. Patients educated on the definition of leisure and the importance of having different leisure interests outside of the hospital. Group discussed how leisure activities can often be used as pharmacologist and that listening to music and being outside are examples.    Goal Area(s) Addressed:  Patient will identify a current leisure interest.  Patient will practice making a positive decision. Patient will have the opportunity to try a new leisure activity.   Affect/Mood: N/A   Participation Level: Did not attend    Clinical Observations/Individualized Feedback: Patient did not attend group.   Plan: Continue to engage patient in RT group sessions 2-3x/week.   Jeoffrey BRAVO Celestia, LRT, CTRS 07/06/2024 5:17 PM

## 2024-07-06 NOTE — Progress Notes (Signed)
 SI/HI: denies  Behavior/Mood: alert and oriented with some forgetfulness. Flat affect. Denies anxiety and depression.  Interaction/Group: isolative. Did not attend group.  Medication/PRNs:po med compliant/No PRNS given.  Pain: denies  Other:1:1 at bedside for safety. Slept 9.75   07/05/24 2100  Psych Admission Type (Psych Patients Only)  Admission Status Involuntary  Psychosocial Assessment  Patient Complaints None  Eye Contact Fair  Facial Expression Flat  Affect Flat  Speech Logical/coherent  Interaction Minimal  Motor Activity Slow  Appearance/Hygiene In scrubs  Behavior Characteristics Appropriate to situation  Mood Pleasant  Thought Process  Coherency WDL  Content WDL  Delusions None reported or observed  Perception WDL  Hallucination None reported or observed  Judgment Impaired  Confusion Mild  Danger to Self  Current suicidal ideation? Denies

## 2024-07-06 NOTE — Progress Notes (Signed)
 Behavior: Patient pleasant and responsive during interaction.   Psych assessment:  Patient appropriate and pleasant  Interaction / Group attendance:   Patient having meals with group. Attended 1 group activity today  Medication/ PRNs:  Patient taking medications without issue this shift  Pain:  No complaints of pain 15 min checks in place for safety.    Ongoing care plan in place

## 2024-07-06 NOTE — Group Note (Signed)
 Date:  07/06/2024 Time:  9:00 PM  Group Topic/Focus:  Coping With Mental Health Crisis:   The purpose of this group is to help patients identify strategies for coping with mental health crisis.  Group discusses possible causes of crisis and ways to manage them effectively.    Pt did not attend group.  Alis Sawchuk L 07/06/2024, 9:00 PM

## 2024-07-06 NOTE — Progress Notes (Signed)
 The patient has been moving around in his bed frequently. He recently sat on the edge of his bed and claims that he is not having difficulty sleeping since he slept during the daytime.

## 2024-07-06 NOTE — Group Note (Signed)
 Physical/Occupational Therapy Group Note  Group Topic: Pain Management and Coping   Group Date: 07/06/2024 Start Time: 1305 End Time: 1405 Facilitators: Clive Warren CROME, OT    Group Description:  Group discussed impact of chronic/acute pain on safety and independence with functional tasks and impact on mental health.  Identified and discussed any previously learned or implemented strategies used.  Discussed and reviewed cognitive behavioral pain coping strategies to address/improve overall management of pain. Discussed relaxation, distraction techniques, cognitive restructuring, activity pacing/energy conservation, environment/home safety modifications, and role of sleep and sleep hygiene. Allowed time for questions and further discussion.      Therapeutic Goal(s):   Identify and discuss previously utilized pain coping strategies and implications of pain on function/well-being Identify and discuss implementing new cognitive behavioral pain coping strategies into daily routines Demonstrate understanding and performance of learned cognitive behavioral pain coping strategies.    Individual Participation: Pt present for length of session, however did appear to be asleep for portion of the session. Participatory in discussion and identified distraction technique with prompting. Sat through the relaxation activity but did not actively participate.   Participation Level: Minimal   Participation Quality: Minimal Cues and Moderate Cues   Behavior: Distracted and Lethargic   Speech/Thought Process: Barely audible and Relevant   Affect/Mood: Stable    Insight: Poor   Judgement: Poor   Modes of Intervention: Activity, Clarification, Discussion, Education, Exploration, Problem-solving, Rapport Building, Socialization, and Support  Patient Response to Interventions:  Disengaged   Plan: Continue to engage patient in PT/OT groups 1 - 2x/week.  Bradey Luzier R., MPH, MS, OTR/L ascom  [phone] 07/06/24, 3:17 PM

## 2024-07-06 NOTE — Plan of Care (Signed)
   Problem: Nutrition: Goal: Adequate nutrition will be maintained Outcome: Progressing   Problem: Coping: Goal: Level of anxiety will decrease Outcome: Progressing

## 2024-07-06 NOTE — Progress Notes (Signed)
 SI/HI/AVH: denies all  Behavior/Mood: appropriate/pleasant    Interaction/Group attendance: minimal/ 1 of 4 groups   Medication/PRNs: compliant/none    Pain: denies  Other: pt is a 1:1 at night for risks of falls

## 2024-07-06 NOTE — Progress Notes (Signed)
 Physical Therapy Treatment Patient Details Name: Benjamin [name] MRN: [id number] DOB: 1945-09-20 Today's Date: 07/06/2024   History of Present Illness Pt is a 78 y/o M admitted on 03/27/24 after jumping from a 2 story building. PT has been consulted after pt experienced a fall. PMH: arthritis, prostate CA    PT Comments  Pt was asleep in bed upon arrival. 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 Appeared A and O x3 and was cooperative in participating in [employer] and ambulating. Pt performed supine to sit transfer with supervision A. Pt performed STS to RW to gain standing balance before beginning [employer]. Following completing the [employer], it was evident that he is still at a falls risk after scoring 44/56. He had trouble with single leg balance, stair stepping, and tandem stance. It should be noted that the test was performed with the pt wearing his shoes. Ambulated 160 ft with RW with CGA. Performed stand to sit transfer back into chair to finish treatment. Discharge recs still apply. Continue with POC.    If plan is discharge home, recommend the following: A little help with walking and/or transfers;A little help with bathing/dressing/bathroom   Can travel by private vehicle        Equipment Recommendations  Other (comment)    Recommendations for Other Services       Precautions / Restrictions Precautions Precautions: Fall Restrictions Weight Bearing Restrictions Per Provider Order: No     Mobility  Bed Mobility Overal bed mobility: Independent             General bed mobility comments: Supine to sit transfer supervision A    Transfers Overall transfer level: Independent Equipment used: Rolling walker (2 wheels)               General transfer comment: Transfers well independently    Ambulation/Gait Ambulation/Gait assistance: Supervision, Contact guard assist Gait Distance (Feet): 150 Feet Assistive device: None Gait  Pattern/deviations: Step-through pattern, Decreased step length - right, Decreased step length - left, Decreased stride length Gait velocity: decreased     General Gait Details: Ambulated to stairs with CGA without RW. Then ambulated further distance with RW for safety     Balance Overall balance assessment: Needs assistance Sitting-balance support: Feet supported Sitting balance-Leahy Scale: Good     Standing balance support: During functional activity, No upper extremity supported Standing balance-Leahy Scale: Fair Standing balance comment: Fair standing balance with CGA       Standardized Balance Assessment Standardized Balance Assessment : Berg Balance Test (Performed Berg Balance Test. Pt scored 44/56. Pt still remains at a risk for falls especially when doing stairs, tandem, or single leg balance.) Berg Balance Test Sit to Stand: Able to stand without using hands and stabilize independently Standing Unsupported: Able to stand 30 seconds unsupported Sitting with Back Unsupported but Feet Supported on Floor or Stool: Able to sit safely and securely 2 minutes Stand to Sit: Sits safely with minimal use of hands Transfers: Able to transfer safely, definite need of hands Standing Unsupported with Eyes Closed: Able to stand 10 seconds with supervision Standing Ubsupported with Feet Together: Able to place feet together independently and stand for 1 minute with supervision From Standing, Reach Forward with Outstretched Arm: Can reach confidently >25 cm (10) From Standing Position, Pick up Object from Floor: Able to pick up shoe safely and easily From Standing Position, Turn to Look Behind Over each Shoulder: Looks behind from both sides and weight  shifts well Turn 360 Degrees: Able to turn 360 degrees safely in 4 seconds or less Standing Unsupported, Alternately Place Feet on Step/Stool: Able to stand independently and complete 8 steps >20 seconds Standing Unsupported, One Foot in  Front: Needs help to step but can hold 15 seconds Standing on One Leg: Tries to lift leg/unable to hold 3 seconds but remains standing independently Total Score: 44        Communication Communication Communication: Impaired Factors Affecting Communication: Difficulty expressing self  Cognition Arousal: Alert Behavior During Therapy: Flat affect   PT - Cognitive impairments: No apparent impairments       PT - Cognition Comments: pleasant throughout session Following commands: Intact      Cueing Cueing Techniques: Verbal cues, Tactile cues  Exercises      General Comments General comments (skin integrity, edema, etc.): Very nice pt to work with. Has very flat emotion      Pertinent Vitals/Pain Pain Assessment Pain Assessment: No/denies pain     PT Goals (current goals can now be found in the care plan section) Acute Rehab PT Goals Patient Stated Goal: none stated Progress towards PT goals: Progressing toward goals    Frequency    Min 2X/week      PT Plan      Co-evaluation              AM-PAC PT 6 Clicks Mobility   Outcome Measure  Help needed turning from your back to your side while in a flat bed without using bedrails?: None Help needed moving from lying on your back to sitting on the side of a flat bed without using bedrails?: None Help needed moving to and from a bed to a chair (including a wheelchair)?: None Help needed standing up from a chair using your arms (e.g., wheelchair or bedside chair)?: None Help needed to walk in hospital room?: A Little Help needed climbing 3-5 steps with a railing? : A Little 6 Click Score: 22    End of Session Equipment Utilized During Treatment: Gait belt Activity Tolerance: Patient tolerated treatment well Patient left: in chair Nurse Communication: Mobility status PT Visit Diagnosis: Unsteadiness on feet (R26.81);History of falling (Z91.81)     Time: 8549-8491 PT Time Calculation (min) (ACUTE ONLY):  18 min  Charges:    $Therapeutic Activity: 8-22 mins PT General Charges $$ ACUTE PT VISIT: 1 Visit                     Signe Alexee Delsanto SPTA    Nicolus Ose 07/06/2024, 3:41 PM

## 2024-07-06 NOTE — Plan of Care (Signed)

## 2024-07-06 NOTE — Group Note (Signed)
 Recreation Therapy Group Note   Group Topic:Relaxation  Group Date: 07/06/2024 Start Time: 1055 End Time: 1120 Facilitators: Celestia Jeoffrey BRAVO, LRT, CTRS Location: Dayroom  Group Description: PMR (Progressive Muscle Relaxation). LRT educates patients on what PMR is and the benefits that come from it. Patients are asked to sit with their feet flat on the floor while sitting up and all the way back in their chair, if possible. LRT and pts follow a prompt through a speaker that requires you to tense and release different muscles in their body and focus on their breathing. During session, lights are off and soft music is being played. Pts are given a stress ball to use if needed.   Goal Area(s) Addressed:  Patients will be able to describe progressive muscle relaxation.  Patient will practice using relaxation technique. Patient will identify a new coping skill.  Patient will follow multistep directions to reduce anxiety and stress.    Affect/Mood: N/A   Participation Level: Did not attend    Clinical Observations/Individualized Feedback: Patient did not attend group.   Plan: Continue to engage patient in RT group sessions 2-3x/week.   Jeoffrey BRAVO Celestia, LRT, CTRS 07/06/2024 11:36 AM

## 2024-07-07 DIAGNOSIS — S01112A Laceration without foreign body of left eyelid and periocular area, initial encounter: Secondary | ICD-10-CM | POA: Diagnosis not present

## 2024-07-07 DIAGNOSIS — F22 Delusional disorders: Secondary | ICD-10-CM | POA: Diagnosis not present

## 2024-07-07 DIAGNOSIS — F0390 Unspecified dementia without behavioral disturbance: Secondary | ICD-10-CM | POA: Diagnosis not present

## 2024-07-07 NOTE — Group Note (Signed)
 Date:  07/07/2024 Time:  8:37 PM  Group Topic/Focus:  Identifying Needs:   The focus of this group is to help patients identify their personal needs that have been historically problematic and identify healthy behaviors to address their needs.    Participation Level:  Active  Participation Quality:  Appropriate  Affect:  Appropriate  Cognitive:  Appropriate  Insight: Appropriate  Engagement in Group:  Engaged  Modes of Intervention:  Discussion  Additional Comments:    Miriah Maruyama L 07/07/2024, 8:37 PM

## 2024-07-07 NOTE — Plan of Care (Signed)
  Problem: Activity: Goal: Risk for activity intolerance will decrease 07/07/2024 0516 by Ezzard Stank, RN Outcome: Progressing 07/07/2024 0516 by Ezzard Stank, RN Outcome: Progressing   Problem: Nutrition: Goal: Adequate nutrition will be maintained Outcome: Progressing   Problem: Coping: Goal: Level of anxiety will decrease Outcome: Progressing   Problem: Pain Managment: Goal: General experience of comfort will improve and/or be controlled Outcome: Progressing   Problem: Safety: Goal: Ability to remain free from injury will improve Outcome: Progressing
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
## 2024-07-07 NOTE — Progress Notes (Signed)
 SI/HI: Denies  Behavior/Mood: Calm and cooperative. Flat affect. Denies anxiety and depression  Interaction/Group:Min interaction with peers and staff. Did not attend group.  Medication/PRNs: po med compliant/No PRNs given  Pain: denies  Other: slept throughout the shift. 1:1 remains in place for safety   07/06/24 2100  Psychosocial Assessment  Patient Complaints None  Eye Contact Fair  Facial Expression Flat  Affect Flat  Speech Soft  Interaction Minimal  Motor Activity Slow  Appearance/Hygiene In scrubs  Behavior Characteristics Appropriate to situation  Mood Pleasant  Thought Process  Coherency WDL  Content WDL  Delusions None reported or observed  Perception WDL  Hallucination None reported or observed  Judgment Impaired  Confusion Mild  Danger to Self  Current suicidal ideation? Denies

## 2024-07-07 NOTE — Group Note (Signed)
 [facility] LCSW Group Therapy Note   Group Date: 07/07/2024 Start Time: 1030 End Time: 1115   Type of Therapy/Topic:  Group Therapy:  Balance in Life  Participation Level:  Did Not Attend   Description of Group:    This group will address the concept of balance and how it feels and looks when one is unbalanced. Patients will be encouraged to process areas in their lives that are out of balance, and identify reasons for remaining unbalanced. Facilitators will guide patients utilizing problem- solving interventions to address and correct the stressor making their life unbalanced. Understanding and applying boundaries will be explored and addressed for obtaining  and maintaining a balanced life. Patients will be encouraged to explore ways to assertively make their unbalanced needs known to significant others in their lives, using other group members and facilitator for support and feedback.  Therapeutic Goals: Patient will identify two or more emotions or situations they have that consume much of in their lives. Patient will identify signs/triggers that life has become out of balance:  Patient will identify two ways to set boundaries in order to achieve balance in their lives:  Patient will demonstrate ability to communicate their needs through discussion and/or role plays  Summary of Patient Progress: Patient did not attend group       Therapeutic Modalities:   Cognitive Behavioral Therapy Solution-Focused Therapy Assertiveness Training   Rexene LELON Mae, LCSWA

## 2024-07-07 NOTE — [facility] Group Notes (Signed)
 [facility] Group Notes:  (Nursing/MHT/Case Management/Adjunct)  Date:  07/07/2024  Time:  5:34 PM  Type of Therapy:  stress management  Participation Level:  Did Not Attend  Participation Quality:  did not attend  Affect:  did not attend  Cognitive:  did not attend  Insight:  None did not attend  Engagement in Group:  did not attend  Modes of Intervention:  Activity and Socialization  Summary of Progress/Problems:  Benjamin Petersheim S.,RN 07/07/2024, 5:34 PM

## 2024-07-07 NOTE — Progress Notes (Signed)
 [facility] MD Progress Note  07/07/2024 12:12 AM Benjamin [name]  MRN:  [id number] Benjamin [name] is a 78 year old male who presents to the inpatient geriatric psych unit after jumping from a two story building. Patient was originally seen at [facility] who referred him to the ED who admitted him here on 03/27/2024. Patient reports that people from the [place] gang were trying to kill him and the only escape was through the bedroom window on the second floor. He currently lives in the house with Benjamin [name] and his family and has been living with them for 3-4 years without any problems. On 03/19/2024 he went to the doctor and when he got back home the family wouldn't let him leave. He states that after he realized the gang was going to kill him he barricaded the door with his bed and jumped out the window. When he landed he ran away until one of his neighbors found him and called 911. Patient is admitted to [facility] unit with Q15 min safety monitoring. Multidisciplinary team approach is offered. Medication management; group/milieu therapy is offered.     Subjective: Chart is reviewed and discussed with the treatment team.  APS is working on legal guardianship and placement.  He has guardianship hearing June 19, 2024  Patient is noted to be resting in bed.  He offers no complaints.  He reports fair appetite and sleep.  Per sitter patient is working with physical therapy regarding stability of his feet.  Patient denies any confusion or any hallucinations.  Patient is not endorsing SI/HI/plan Past Psychiatric History: see h&P  Family History:  Family History  Problem Relation Age of Onset   Colon cancer Neg Hx    Colon polyps Neg Hx    Stomach cancer Neg Hx    Esophageal cancer Neg Hx    Social History:  Social History   Substance and Sexual Activity  Alcohol Use No     Social History   Substance and Sexual Activity  Drug Use No    Social History   Socioeconomic History    Marital status: Single    Spouse name: Not on file   Number of children: Not on file   Years of education: Not on file   Highest education level: Not on file  Occupational History   Not on file  Tobacco Use   Smoking status: Former    Types: Cigarettes   Smokeless tobacco: Never  Vaping Use   Vaping status: Never Used  Substance and Sexual Activity   Alcohol use: No   Drug use: No   Sexual activity: Not on file  Other Topics Concern   Not on file  Social History Narrative   Not on file   Social Drivers of Health   Financial Resource Strain: Not on file  Food Insecurity: No Food Insecurity (03/27/2024)   Hunger Vital Sign    Worried About Running Out of Food in the Last Year: Never true    Ran Out of Food in the Last Year: Never true  Transportation Needs: No Transportation Needs (03/27/2024)   PRAPARE - Administrator, Civil Service (Medical): No    Lack of Transportation (Non-Medical): No  Physical Activity: Not on file  Stress: Not on file  Social Connections: Moderately Integrated (03/27/2024)   Social Connection and Isolation Panel    Frequency of Communication with Friends and Family: Twice a week    Frequency of Social Gatherings with Friends and Family: Once a  week    Attends Religious Services: 1 to 4 times per year    Active Member of Clubs or Organizations: No    Attends Engineer, Structural: 1 to 4 times per year    Marital Status: Divorced   Past Medical History:  Past Medical History:  Diagnosis Date   Allergy    Arthritis    back   Prostate cancer (HCC) dx'd 2010   surg only    Past Surgical History:  Procedure Laterality Date   COLONOSCOPY     EYE SURGERY     PROSTATE SURGERY      Current Medications: Current Facility-Administered Medications  Medication Dose Route Frequency Provider Last Rate Last Admin   acetaminophen  (TYLENOL ) tablet 650 mg  650 mg Oral Q6H PRN Coleman, Carolyn H, NP       alum & mag hydroxide-simeth  (MAALOX/MYLANTA) 200-200-20 MG/5ML suspension 30 mL  30 mL Oral Q4H PRN Coleman, Carolyn H, NP       docusate sodium  (COLACE) capsule 100 mg  100 mg Oral Daily Ladaja Yusupov, MD   100 mg at 07/06/24 1013   fluPHENAZine  (PROLIXIN ) tablet 10 mg  10 mg Oral BID Shrivastava, Aryendra, MD   10 mg at 07/06/24 2058   Or   fluPHENAZine  (PROLIXIN ) injection 5 mg  5 mg Intramuscular BID Shrivastava, Aryendra, MD       magnesium  hydroxide (MILK OF MAGNESIA) suspension 30 mL  30 mL Oral Daily PRN Coleman, Carolyn H, NP       OLANZapine  (ZYPREXA ) injection 5 mg  5 mg Intramuscular TID PRN Mardy Elveria DEL, NP   5 mg at 04/12/24 2230   OLANZapine  zydis (ZYPREXA ) disintegrating tablet 5 mg  5 mg Oral TID PRN Coleman, Carolyn H, NP       polyethylene glycol (MIRALAX  / GLYCOLAX ) packet 17 g  17 g Oral Daily PRN Donnelly Mellow, MD   17 g at 06/04/24 2123    Lab Results:  No results found for this or any previous visit (from the past 48 hours).     Blood Alcohol level:  Lab Results  Component Value Date   [facility] <15 03/26/2024    Metabolic Disorder Labs: Lab Results  Component Value Date   HGBA1C 5.6 04/02/2024   MPG 114 04/02/2024   No results found for: PROLACTIN Lab Results  Component Value Date   CHOL 146 04/02/2024   TRIG 37 04/02/2024   HDL 43 04/02/2024   CHOLHDL 3.4 04/02/2024   VLDL 7 04/02/2024   LDLCALC 96 04/02/2024    Physical Findings: AIMS:  , ,  ,  ,    CIWA:    COWS:      Psychiatric Specialty Exam:  Presentation  General Appearance:  Appropriate for Environment; Bizarre  Eye Contact: Fair  Speech: Normal rate  Speech Volume: Normal    Mood and Affect  Mood: Fine Affect: Congruent   Thought Process  Thought Processes: Disorganized At baseline Descriptions of Associations:Intact  Orientation:Full (Time, Place and Person)  Thought Content: Paranoia at baseline Hallucinations: Denies  Ideas of Reference: Paranoia at baseline Suicidal  Thoughts: Denies  Homicidal Thoughts: Denies   Sensorium  Memory: impiared  Judgment: Impaired  Insight: Shallow   Executive Functions  Concentration: Fair  Attention Span: Fair  Recall: [employer] of Knowledge: Fair  Language: Fair   Psychomotor Activity  Psychomotor Activity: No data recorded  Musculoskeletal: Strength & Muscle Tone: within normal limits Gait & Station: normal Assets  Assets: Manufacturing Systems Engineer; Resilience    Physical Exam: Physical Exam Vitals and nursing note reviewed.    ROS Blood pressure 91/64, pulse 71, temperature 98.3 F (36.8 C), resp. rate 14, height 6' (1.829 m), weight 100 kg, SpO2 98%. Body mass index is 29.91 kg/m.  Diagnosis: Principal Problem:   Delusional disorder (HCC) Active Problems:   Laceration of left eyebrow  Major Neurocognitive disorder  Clinical Decision Making: Patient currently admitted after jumping off a two-story building in the context of possible delusions as reported as the people in the house he lived for 4 years trying to kill him.  Patient needs to be monitored closely for ongoing psychosis and paranoid delusions.  Given patient's confusion and chronic paranoia, no family support, APS has taken up guardianship and looking for placement     Safety and Monitoring:             -- InVoluntary admission to inpatient psychiatric unit for safety, stabilization and treatment             -- Daily contact with patient to assess and evaluate symptoms and progress in treatment             -- Patient's case to be discussed in multi-disciplinary team meeting             -- Observation Level: q15 minute checks             -- Vital signs:  q12 hours             -- Precautions: suicide, elopement, and assault   2. Psychiatric Diagnoses and Treatment:              Continue Prolixin  to 10 mg twice daily.  -- The risks/benefits/side-effects/alternatives to this medication were discussed in detail with  the patient and time was given for questions. The patient consents to medication trial.                -- Metabolic profile and EKG monitoring obtained while on an atypical antipsychotic (BMI: Lipid Panel: HbgA1c: QTc:)              -- Encouraged patient to participate in unit milieu and in scheduled group therapies                            3. Medical Issues Being Addressed:    Fall on/8/25, CT head normal  4. Discharge Planning:   -- Social work and case management to assist with discharge planning and identification of hospital follow-up needs prior to discharge  -- Estimated LOS: 3-4 days  Ryder Chesmore, MD 07/07/2024, 12:12 AM

## 2024-07-07 NOTE — Progress Notes (Signed)
   07/07/24 1500  Psych Admission Type (Psych Patients Only)  Admission Status Involuntary  Psychosocial Assessment  Patient Complaints None  Eye Contact Fair  Facial Expression Flat  Affect Flat  Speech Soft  Interaction Minimal  Motor Activity Slow  Appearance/Hygiene In scrubs  Behavior Characteristics Appropriate to situation  Mood Pleasant  Thought Process  Coherency WDL  Content WDL  Delusions None reported or observed  Perception WDL  Hallucination None reported or observed  Judgment Impaired  Confusion Mild  Danger to Self  Current suicidal ideation? Denies  Self-Injurious Behavior No self-injurious ideation or behavior indicators observed or expressed   Agreement Not to Harm Self Yes  Description of Agreement Verbal  Danger to Others  Danger to Others None reported or observed

## 2024-07-07 NOTE — Group Note (Signed)
 Date:  07/07/2024 Time:  3:28 PM  Group Topic/Focus:  Fresh air Therapy with cool music and conversation    Participation Level:  Did Not Attend  [name] 07/07/2024, 3:28 PM

## 2024-07-07 NOTE — Group Note (Signed)
 Date:  07/07/2024 Time:  10:53 AM  Group Topic/Focus:  Coping With Mental Health Crisis:   The purpose of this group is to help patients identify strategies for coping with mental health crisis.  Group discusses possible causes of crisis and ways to manage them effectively.    Participation Level:  Did Not Attend  Leigh VEAR Pais 07/07/2024, 10:53 AM

## 2024-07-08 DIAGNOSIS — F0390 Unspecified dementia without behavioral disturbance: Secondary | ICD-10-CM | POA: Diagnosis not present

## 2024-07-08 DIAGNOSIS — S01112A Laceration without foreign body of left eyelid and periocular area, initial encounter: Secondary | ICD-10-CM | POA: Diagnosis not present

## 2024-07-08 DIAGNOSIS — F22 Delusional disorders: Secondary | ICD-10-CM | POA: Diagnosis not present

## 2024-07-08 NOTE — Plan of Care (Signed)
   Problem: Education: Goal: Knowledge of [facility] Education information/materials will improve Outcome: Progressing Goal: Emotional status will improve Outcome: Progressing Goal: Mental status will improve Outcome: Progressing Goal: Verbalization of understanding the information provided will improve Outcome: Progressing   Problem: Safety: Goal: Periods of time without injury will increase Outcome: Progressing

## 2024-07-08 NOTE — Progress Notes (Signed)
   07/08/24 1200  Psych Admission Type (Psych Patients Only)  Admission Status Involuntary  Psychosocial Assessment  Patient Complaints None  Eye Contact Fair  Facial Expression Flat  Affect Flat  Speech Soft  Interaction Minimal  Motor Activity Slow  Appearance/Hygiene In scrubs  Behavior Characteristics Appropriate to situation  Mood Pleasant  Thought Process  Coherency WDL  Content WDL  Delusions None reported or observed  Perception WDL  Hallucination None reported or observed  Judgment Impaired  Confusion Mild  Danger to Self  Current suicidal ideation? Denies  Danger to Others  Danger to Others None reported or observed

## 2024-07-08 NOTE — Group Note (Signed)
 Date:  07/08/2024 Time:  10:45 AM  Group Topic/Focus:  Getting fit with Hulon Ferron    Participation Level:  Did Not Attend    [name] 07/08/2024, 10:45 AM

## 2024-07-08 NOTE — Progress Notes (Signed)
 SI/HI:Denies  Behavior/Mood:Pleasant and cooperative. Flat affect. Denies anxiety and depression  Intervention/Group:min interaction with peers. Attends group  Medication/PRNs:po med complaint/No PRNs given  Pain:denies  Other: 1:1 remains at bedside for safety. Slept well throughout the night.   07/07/24 2100  Psych Admission Type (Psych Patients Only)  Admission Status Involuntary  Psychosocial Assessment  Patient Complaints None  Eye Contact Fair  Facial Expression Flat  Affect Flat  Speech Soft  Interaction Minimal  Motor Activity Slow  Appearance/Hygiene In scrubs  Behavior Characteristics Appropriate to situation  Mood Pleasant  Thought Process  Coherency WDL  Content WDL  Delusions None reported or observed  Perception WDL  Hallucination None reported or observed  Judgment Impaired  Confusion Mild  Danger to Self  Current suicidal ideation? Denies

## 2024-07-08 NOTE — Group Note (Signed)
 Date:  07/08/2024 Time:  7:07 PM  Group Topic/Focus:  Coping With Mental Health Crisis:   The purpose of this group is to help patients identify strategies for coping with mental health crisis.  Group discusses possible causes of crisis and ways to manage them effectively. Healthy Communication:   The focus of this group is to discuss communication, barriers to communication, as well as healthy ways to communicate with others. Wellness Toolbox:   The focus of this group is to discuss various aspects of wellness, balancing those aspects and exploring ways to increase the ability to experience wellness.  Patients will create a wellness toolbox for use upon discharge.    Participation Level:  Did Not Attend  Participation Quality:    Affect:    Cognitive:    Insight:   Engagement in Group:    Modes of Intervention:    Additional Comments:    Jamorion Gomillion L Victorhugo Preis 07/08/2024, 7:07 PM

## 2024-07-08 NOTE — Progress Notes (Signed)
   07/08/24 2100  Psych Admission Type (Psych Patients Only)  Admission Status Involuntary  Psychosocial Assessment  Patient Complaints None  Eye Contact Fair  Facial Expression Flat  Affect Flat  Speech Soft  Interaction Minimal  Motor Activity Slow  Appearance/Hygiene In scrubs  Behavior Characteristics Appropriate to situation;Cooperative  Mood Pleasant  Thought Process  Coherency WDL  Content WDL  Delusions None reported or observed  Perception WDL  Hallucination None reported or observed  Judgment Impaired  Confusion Mild  Danger to Self  Current suicidal ideation? Denies  Self-Injurious Behavior No self-injurious ideation or behavior indicators observed or expressed   Agreement Not to Harm Self Yes  Description of Agreement verbal  Danger to Others  Danger to Others None reported or observed

## 2024-07-08 NOTE — Group Note (Signed)
 [facility] LCSW Group Therapy Note   Group Date: 07/08/2024 Start Time: 1100 End Time: 1135   Type of Therapy/Topic:  Group Therapy:  Emotion Regulation  Participation Level:  Did Not Attend   Mood:  Description of Group:    The purpose of this group is to assist patients in learning to regulate negative emotions and experience positive emotions. Patients will be guided to discuss ways in which they have been vulnerable to their negative emotions. These vulnerabilities will be juxtaposed with experiences of positive emotions or situations, and patients challenged to use positive emotions to combat negative ones. Special emphasis will be placed on coping with negative emotions in conflict situations, and patients will process healthy conflict resolution skills.  Therapeutic Goals: Patient will identify two positive emotions or experiences to reflect on in order to balance out negative emotions:  Patient will label two or more emotions that they find the most difficult to experience:  Patient will be able to demonstrate positive conflict resolution skills through discussion or role plays:   Summary of Patient Progress:  Patient did not attend group.     Therapeutic Modalities:   Cognitive Behavioral Therapy Feelings Identification Dialectical Behavioral Therapy   Rexene LELON Mae, LCSWA

## 2024-07-08 NOTE — Plan of Care (Signed)
  Problem: Pain Managment: Goal: General experience of comfort will improve and/or be controlled Outcome: Progressing

## 2024-07-08 NOTE — Plan of Care (Signed)
  Problem: Education: Goal: Knowledge of General Education information will improve Description: Including pain rating scale, medication(s)/side effects and non-pharmacologic comfort measures Outcome: Progressing   Problem: Health Behavior/Discharge Planning: Goal: Ability to manage health-related needs will improve Outcome: Progressing   Problem: Clinical Measurements: Goal: Ability to maintain clinical measurements within normal limits will improve Outcome: Progressing Goal: Will remain free from infection Outcome: Progressing Goal: Diagnostic test results will improve Outcome: Progressing Goal: Respiratory complications will improve Outcome: Progressing Goal: Cardiovascular complication will be avoided Outcome: Progressing   Problem: Activity: Goal: Risk for activity intolerance will decrease Outcome: Progressing   Problem: Nutrition: Goal: Adequate nutrition will be maintained Outcome: Progressing   Problem: Coping: Goal: Level of anxiety will decrease Outcome: Progressing   Problem: Elimination: Goal: Will not experience complications related to bowel motility Outcome: Progressing Goal: Will not experience complications related to urinary retention Outcome: Progressing   Problem: Pain Managment: Goal: General experience of comfort will improve and/or be controlled Outcome: Progressing   Problem: Safety: Goal: Ability to remain free from injury will improve Outcome: Progressing   Problem: Skin Integrity: Goal: Risk for impaired skin integrity will decrease Outcome: Progressing   Problem: Education: Goal: Knowledge of [place] [facility] Education information/materials will improve Outcome: Progressing Goal: Emotional status will improve Outcome: Progressing Goal: Mental status will improve Outcome: Progressing Goal: Verbalization of understanding the information provided will improve Outcome: Progressing   Problem: Activity: Goal: Interest or  engagement in activities will improve Outcome: Progressing Goal: Sleeping patterns will improve Outcome: Progressing   Problem: Coping: Goal: Ability to verbalize frustrations and anger appropriately will improve Outcome: Progressing Goal: Ability to demonstrate self-control will improve Outcome: Progressing   Problem: Health Behavior/Discharge Planning: Goal: Identification of resources available to assist in meeting health care needs will improve Outcome: Progressing Goal: Compliance with treatment plan for underlying cause of condition will improve Outcome: Progressing   Problem: Physical Regulation: Goal: Ability to maintain clinical measurements within normal limits will improve Outcome: Progressing   Problem: Safety: Goal: Periods of time without injury will increase Outcome: Progressing

## 2024-07-08 NOTE — Progress Notes (Signed)
 [facility] MD Progress Note  07/08/2024 7:40 PM Benjamin [name]  MRN:  [id number] Benjamin [name] is a 78 year old male who presents to the inpatient geriatric psych unit after jumping from a two story building. Patient was originally seen at [facility] who referred him to the ED who admitted him here on 03/27/2024. Patient reports that people from the [place] gang were trying to kill him and the only escape was through the bedroom window on the second floor. He currently lives in the house with [name] and his family and has been living with them for 3-4 years without any problems. On     Patient is pleasant interactive denies any suicidal homicidal thoughts.  Complains about wanting to feel better very vague  Patient is noted to be resting in bed.  He offers no complaints.  He reports fair appetite and sleep.  Per sitter patient is working with physical therapy regarding stability of his feet.  Patient denies any confusion or any hallucinations.  Patient is not endorsing SI/HI/plan Past Psychiatric History: see h&P  Family History:  Family History  Problem Relation Age of Onset   Colon cancer Neg Hx    Colon polyps Neg Hx    Stomach cancer Neg Hx    Esophageal cancer Neg Hx    Social History:  Social History   Substance and Sexual Activity  Alcohol Use No     Social History   Substance and Sexual Activity  Drug Use No    Social History   Socioeconomic History   Marital status: Single    Spouse name: Not on file   Number of children: Not on file   Years of education: Not on file   Highest education level: Not on file  Occupational History   Not on file  Tobacco Use   Smoking status: Former    Types: Cigarettes   Smokeless tobacco: Never  Vaping Use   Vaping status: Never Used  Substance and Sexual Activity   Alcohol use: No   Drug use: No   Sexual activity: Not on file  Other Topics Concern   Not on file  Social History Narrative   Not on file    Social Drivers of Health   Financial Resource Strain: Not on file  Food Insecurity: No Food Insecurity (03/27/2024)   Hunger Vital Sign    Worried About Running Out of Food in the Last Year: Never true    Ran Out of Food in the Last Year: Never true  Transportation Needs: No Transportation Needs (03/27/2024)   PRAPARE - Administrator, Civil Service (Medical): No    Lack of Transportation (Non-Medical): No  Physical Activity: Not on file  Stress: Not on file  Social Connections: Moderately Integrated (03/27/2024)   Social Connection and Isolation Panel    Frequency of Communication with Friends and Family: Twice a week    Frequency of Social Gatherings with Friends and Family: Once a week    Attends Religious Services: 1 to 4 times per year    Active Member of [employer] or Organizations: No    Attends Engineer, Structural: 1 to 4 times per year    Marital Status: Divorced   Past Medical History:  Past Medical History:  Diagnosis Date   Allergy    Arthritis    back   Prostate cancer (HCC) dx'd 2010   surg only    Past Surgical History:  Procedure Laterality Date   COLONOSCOPY  EYE SURGERY     PROSTATE SURGERY      Current Medications: Current Facility-Administered Medications  Medication Dose Route Frequency Provider Last Rate Last Admin   acetaminophen  (TYLENOL ) tablet 650 mg  650 mg Oral Q6H PRN Coleman, Carolyn H, NP       alum & mag hydroxide-simeth (MAALOX/MYLANTA) 200-200-20 MG/5ML suspension 30 mL  30 mL Oral Q4H PRN Coleman, Carolyn H, NP       docusate sodium  (COLACE) capsule 100 mg  100 mg Oral Daily Jadapalle, Sree, MD   100 mg at 07/08/24 1000   fluPHENAZine  (PROLIXIN ) tablet 10 mg  10 mg Oral BID Shrivastava, Aryendra, MD   10 mg at 07/08/24 1000   Or   fluPHENAZine  (PROLIXIN ) injection 5 mg  5 mg Intramuscular BID Shrivastava, Aryendra, MD       magnesium  hydroxide (MILK OF MAGNESIA) suspension 30 mL  30 mL Oral Daily PRN Coleman, Carolyn  H, NP       OLANZapine  (ZYPREXA ) injection 5 mg  5 mg Intramuscular TID PRN Coleman, Carolyn H, NP   5 mg at 04/12/24 2230   OLANZapine  zydis (ZYPREXA ) disintegrating tablet 5 mg  5 mg Oral TID PRN Coleman, Carolyn H, NP       polyethylene glycol (MIRALAX  / GLYCOLAX ) packet 17 g  17 g Oral Daily PRN Donnelly Mellow, MD   17 g at 06/04/24 2123    Lab Results:  No results found for this or any previous visit (from the past 48 hours).     Blood Alcohol level:  Lab Results  Component Value Date   [facility] <15 03/26/2024    Metabolic Disorder Labs: Lab Results  Component Value Date   HGBA1C 5.6 04/02/2024   MPG 114 04/02/2024   No results found for: PROLACTIN Lab Results  Component Value Date   CHOL 146 04/02/2024   TRIG 37 04/02/2024   HDL 43 04/02/2024   CHOLHDL 3.4 04/02/2024   VLDL 7 04/02/2024   LDLCALC 96 04/02/2024    Physical Findings: AIMS:  , ,  ,  ,    CIWA:    COWS:      Psychiatric Specialty Exam:  Presentation  General Appearance:  Appropriate for Environment; Bizarre  Eye Contact: Fair  Speech: Normal rate  Speech Volume: Normal    Mood and Affect  Mood: Fine Affect: Congruent   Thought Process  Thought Processes: Disorganized At baseline Descriptions of Associations:Intact  Orientation:Full (Time, Place and Person)  Thought Content: Paranoia at baseline Hallucinations: Denies  Ideas of Reference: Paranoia at baseline Suicidal Thoughts: Denies  Homicidal Thoughts: Denies   Sensorium  Memory: impiared  Judgment: Impaired  Insight: Shallow   Executive Functions  Concentration: Fair  Attention Span: Fair  Recall: [employer] of Knowledge: Fair  Language: Fair   Psychomotor Activity  Psychomotor Activity: No data recorded  Musculoskeletal: Strength & Muscle Tone: within normal limits Gait & Station: normal Assets  Assets: Manufacturing Systems Engineer; Resilience    Physical Exam: Physical  Exam Vitals and nursing note reviewed.    ROS Blood pressure 108/73, pulse 64, temperature (!) 97.5 F (36.4 C), resp. rate 18, height 6' (1.829 m), weight 100 kg, SpO2 97%. Body mass index is 29.91 kg/m.  Diagnosis: Principal Problem:   Delusional disorder (HCC) Active Problems:   Laceration of left eyebrow  Major Neurocognitive disorder  Clinical Decision Making: Patient currently admitted after jumping off a two-story building in the context of possible delusions as reported as the  people in the house he lived for 4 years trying to kill him.  Patient needs to be monitored closely for ongoing psychosis and paranoid delusions.  Given patient's confusion and chronic paranoia, no family support, APS has taken up guardianship and looking for placement     Safety and Monitoring:             -- InVoluntary admission to inpatient psychiatric unit for safety, stabilization and treatment             -- Daily contact with patient to assess and evaluate symptoms and progress in treatment             -- Patient's case to be discussed in multi-disciplinary team meeting             -- Observation Level: q15 minute checks             -- Vital signs:  q12 hours             -- Precautions: suicide, elopement, and assault   2. Psychiatric Diagnoses and Treatment:              Continue Prolixin  to 10 mg twice daily.  -- The risks/benefits/side-effects/alternatives to this medication were discussed in detail with the patient and time was given for questions. The patient consents to medication trial.                -- Metabolic profile and EKG monitoring obtained while on an atypical antipsychotic (BMI: Lipid Panel: HbgA1c: QTc:)              -- Encouraged patient to participate in unit milieu and in scheduled group therapies                            3. Medical Issues Being Addressed:    Fall on/8/25, CT head normal  4. Discharge Planning:   -- Social work and case management to assist with  discharge planning and identification of hospital follow-up needs prior to discharge  -- Estimated LOS: 3-4 days  Millie JONELLE Manners, MD 07/08/2024, 7:40 PM

## 2024-07-09 DIAGNOSIS — S01112A Laceration without foreign body of left eyelid and periocular area, initial encounter: Secondary | ICD-10-CM | POA: Diagnosis not present

## 2024-07-09 DIAGNOSIS — F0390 Unspecified dementia without behavioral disturbance: Secondary | ICD-10-CM | POA: Diagnosis present

## 2024-07-09 DIAGNOSIS — F22 Delusional disorders: Secondary | ICD-10-CM | POA: Diagnosis not present

## 2024-07-09 NOTE — Progress Notes (Signed)
 [name] MD Progress Note  07/09/2024 1:21 PM Benjamin [name]  MRN:  [id number] Benjamin [name] is a 78 year old male who presents to the inpatient geriatric psych unit after jumping from a two story building. Patient was originally seen at [facility] who referred him to the ED who admitted him here on 03/27/2024. Patient reports that people from the [place] gang were trying to kill him and the only escape was through the bedroom window on the second floor. He currently lives in the house with Benjamin [name] and his family and has been living with them for 3-4 years without any problems. On 03/19/2024 he went to the doctor and when he got back home the family wouldn't let him leave. He states that after he realized the gang was going to kill him he barricaded the door with his bed and jumped out the window. When he landed he ran away until one of his neighbors found him and called 911. Patient is admitted to [facility] unit with Q15 min safety monitoring. Multidisciplinary team approach is offered. Medication management; group/milieu therapy is offered.     Subjective: Chart is reviewed and discussed with the treatment team.  APS is working on legal guardianship and placement.  He has guardianship hearing June 19, 2024  Patient is noted to be resting in bed.  He offers no complaints.  He denies any symptoms of dizziness or lightheadedness.  He reports that he is able to ambulate back-and-forth between his bed and bathroom and is using the walker consistently.  He denies SI/HI/plan and denies hallucinations.  He offers no other complaints Past Psychiatric History: see h&P  Family History:  Family History  Problem Relation Age of Onset   Colon cancer Neg Hx    Colon polyps Neg Hx    Stomach cancer Neg Hx    Esophageal cancer Neg Hx    Social History:  Social History   Substance and Sexual Activity  Alcohol Use No     Social History   Substance and Sexual Activity  Drug Use No     Social History   Socioeconomic History   Marital status: Single    Spouse name: Not on file   Number of children: Not on file   Years of education: Not on file   Highest education level: Not on file  Occupational History   Not on file  Tobacco Use   Smoking status: Former    Types: Cigarettes   Smokeless tobacco: Never  Vaping Use   Vaping status: Never Used  Substance and Sexual Activity   Alcohol use: No   Drug use: No   Sexual activity: Not on file  Other Topics Concern   Not on file  Social History Narrative   Not on file   Social Drivers of Health   Financial Resource Strain: Not on file  Food Insecurity: No Food Insecurity (03/27/2024)   Hunger Vital Sign    Worried About Running Out of Food in the Last Year: Never true    Ran Out of Food in the Last Year: Never true  Transportation Needs: No Transportation Needs (03/27/2024)   PRAPARE - Administrator, Civil Service (Medical): No    Lack of Transportation (Non-Medical): No  Physical Activity: Not on file  Stress: Not on file  Social Connections: Moderately Integrated (03/27/2024)   Social Connection and Isolation Panel    Frequency of Communication with Friends and Family: Twice a week    Frequency of  Social Gatherings with Friends and Family: Once a week    Attends Religious Services: 1 to 4 times per year    Active Member of [employer] or Organizations: No    Attends Engineer, Structural: 1 to 4 times per year    Marital Status: Divorced   Past Medical History:  Past Medical History:  Diagnosis Date   Allergy    Arthritis    back   Prostate cancer (HCC) dx'd 2010   surg only    Past Surgical History:  Procedure Laterality Date   COLONOSCOPY     EYE SURGERY     PROSTATE SURGERY      Current Medications: Current Facility-Administered Medications  Medication Dose Route Frequency Provider Last Rate Last Admin   acetaminophen  (TYLENOL ) tablet 650 mg  650 mg Oral Q6H PRN Coleman, Carolyn  H, NP       alum & mag hydroxide-simeth (MAALOX/MYLANTA) 200-200-20 MG/5ML suspension 30 mL  30 mL Oral Q4H PRN Coleman, Carolyn H, NP       docusate sodium  (COLACE) capsule 100 mg  100 mg Oral Daily Lakasha Mcfall, MD   100 mg at 07/09/24 0816   fluPHENAZine  (PROLIXIN ) tablet 10 mg  10 mg Oral BID Shrivastava, Aryendra, MD   10 mg at 07/09/24 9183   Or   fluPHENAZine  (PROLIXIN ) injection 5 mg  5 mg Intramuscular BID Shrivastava, Aryendra, MD       magnesium  hydroxide (MILK OF MAGNESIA) suspension 30 mL  30 mL Oral Daily PRN Mardy Elveria DEL, NP       OLANZapine  (ZYPREXA ) injection 5 mg  5 mg Intramuscular TID PRN Mardy Elveria DEL, NP   5 mg at 04/12/24 2230   OLANZapine  zydis (ZYPREXA ) disintegrating tablet 5 mg  5 mg Oral TID PRN Coleman, Carolyn H, NP       polyethylene glycol (MIRALAX  / GLYCOLAX ) packet 17 g  17 g Oral Daily PRN Donnelly Mellow, MD   17 g at 06/04/24 2123    Lab Results:  No results found for this or any previous visit (from the past 48 hours).     Blood Alcohol level:  Lab Results  Component Value Date   [facility] <15 03/26/2024    Metabolic Disorder Labs: Lab Results  Component Value Date   HGBA1C 5.6 04/02/2024   MPG 114 04/02/2024   No results found for: PROLACTIN Lab Results  Component Value Date   CHOL 146 04/02/2024   TRIG 37 04/02/2024   HDL 43 04/02/2024   CHOLHDL 3.4 04/02/2024   VLDL 7 04/02/2024   LDLCALC 96 04/02/2024    Physical Findings: AIMS:  , ,  ,  ,    CIWA:    COWS:      Psychiatric Specialty Exam:  Presentation  General Appearance:  Appropriate for Environment; Bizarre  Eye Contact: Fair  Speech: Normal rate  Speech Volume: Normal    Mood and Affect  Mood: Fine Affect: Congruent   Thought Process  Thought Processes: Disorganized At baseline Descriptions of Associations:Intact  Orientation:Full (Time, Place and Person)  Thought Content: Paranoia at baseline Hallucinations: Denies  Ideas of  Reference: Paranoia at baseline Suicidal Thoughts: Denies  Homicidal Thoughts: Denies   Sensorium  Memory: impiared  Judgment: Impaired  Insight: Shallow   Executive Functions  Concentration: Fair  Attention Span: Fair  Recall: [employer] of Knowledge: Fair  Language: Fair   Psychomotor Activity  Psychomotor Activity: No data recorded  Musculoskeletal: Strength & Muscle Tone:  within normal limits Gait & Station: normal Assets  Assets: Manufacturing Systems Engineer; Resilience    Physical Exam: Physical Exam Vitals and nursing note reviewed.    ROS Blood pressure 99/77, pulse 64, temperature 97.7 F (36.5 C), resp. rate 18, height 6' (1.829 m), weight 100 kg, SpO2 100%. Body mass index is 29.91 kg/m.  Diagnosis: Principal Problem:   Delusional disorder (HCC) Active Problems:   Laceration of left eyebrow  Major Neurocognitive disorder  Clinical Decision Making: Patient currently admitted after jumping off a two-story building in the context of possible delusions as reported as the people in the house he lived for 4 years trying to kill him.  Patient needs to be monitored closely for ongoing psychosis and paranoid delusions.  Given patient's confusion and chronic paranoia, no family support, APS has taken up guardianship and looking for placement     Safety and Monitoring:             -- InVoluntary admission to inpatient psychiatric unit for safety, stabilization and treatment             -- Daily contact with patient to assess and evaluate symptoms and progress in treatment             -- Patient's case to be discussed in multi-disciplinary team meeting             -- Observation Level: q15 minute checks             -- Vital signs:  q12 hours             -- Precautions: suicide, elopement, and assault   2. Psychiatric Diagnoses and Treatment:              Continue Prolixin  to 10 mg twice daily.  -- The risks/benefits/side-effects/alternatives to  this medication were discussed in detail with the patient and time was given for questions. The patient consents to medication trial.                -- Metabolic profile and EKG monitoring obtained while on an atypical antipsychotic (BMI: Lipid Panel: HbgA1c: QTc:)              -- Encouraged patient to participate in unit milieu and in scheduled group therapies                            3. Medical Issues Being Addressed:    Fall on/8/25, CT head normal  4. Discharge Planning:   -- Social work and case management to assist with discharge planning and identification of hospital follow-up needs prior to discharge  -- Estimated LOS: 3-4 days  Benjamin Foil, MD 07/09/2024, 1:21 PM

## 2024-07-09 NOTE — Group Note (Signed)
 Recreation Therapy Group Note   Group Topic:Other  Group Date: 07/09/2024 Start Time: 1400 End Time: 1450 Facilitators: Celestia Jeoffrey BRAVO, LRT, CTRS Location: Dayroom  Activity Description/Intervention: Therapeutic Drumming. Patients with peers and staff were given the opportunity to engage in a leader facilitated HealthRHYTHMS Group Empowerment Drumming Circle with staff from the [employer], in partnership with [employer]. Teaching laboratory technician and trained [place] [employer], Norleen Mon leading with LRT observing and documenting intervention and pt response. This evidenced-based practice targets 7 areas of health and wellbeing in the human experience including: stress-reduction, exercise, self-expression, camaraderie/support, nurturing, spirituality, and music-making (leisure).    Goal Area(s) Addresses:  Patient will engage in pro-social way in music group.  Patient will follow directions of drum leader on the first prompt. Patient will demonstrate no behavioral issues during group.  Patient will identify if a reduction in stress level occurs as a result of participation in therapeutic drum circle.   Affect/Mood: N/A   Participation Level: Did not attend    Clinical Observations/Individualized Feedback: Patient did not attend group.   Plan: Continue to engage patient in RT group sessions 2-3x/week.   [address], LRT, CTRS 07/09/2024 5:22 PM

## 2024-07-09 NOTE — BH IP Treatment Plan (Signed)
 Interdisciplinary Treatment and Diagnostic Plan Update  07/09/2024 Time of Session: 9:00 AM  Benjamin [name] MRN: [id number]  Principal Diagnosis: Delusional disorder [facility])  Secondary Diagnoses: Principal Problem:   Delusional disorder (HCC) Active Problems:   Laceration of left eyebrow   Current Medications:  Current Facility-Administered Medications  Medication Dose Route Frequency Provider Last Rate Last Admin   acetaminophen  (TYLENOL ) tablet 650 mg  650 mg Oral Q6H PRN Coleman, Carolyn H, NP       alum & mag hydroxide-simeth (MAALOX/MYLANTA) 200-200-20 MG/5ML suspension 30 mL  30 mL Oral Q4H PRN Coleman, Carolyn H, NP       docusate sodium  (COLACE) capsule 100 mg  100 mg Oral Daily Jadapalle, Sree, MD   100 mg at 07/09/24 9183   fluPHENAZine  (PROLIXIN ) tablet 10 mg  10 mg Oral BID Shrivastava, Aryendra, MD   10 mg at 07/09/24 9183   Or   fluPHENAZine  (PROLIXIN ) injection 5 mg  5 mg Intramuscular BID Shrivastava, Aryendra, MD       magnesium  hydroxide (MILK OF MAGNESIA) suspension 30 mL  30 mL Oral Daily PRN Mardy Elveria DEL, NP       OLANZapine  (ZYPREXA ) injection 5 mg  5 mg Intramuscular TID PRN Mardy Elveria DEL, NP   5 mg at 04/12/24 2230   OLANZapine  zydis (ZYPREXA ) disintegrating tablet 5 mg  5 mg Oral TID PRN Coleman, Carolyn H, NP       polyethylene glycol (MIRALAX  / GLYCOLAX ) packet 17 g  17 g Oral Daily PRN Jadapalle, Sree, MD   17 g at 06/04/24 2123   PTA Medications: Medications Prior to Admission  Medication Sig Dispense Refill Last Dose/Taking   docusate sodium  (COLACE) 100 MG capsule Take 100 mg by mouth daily.      omega-3 acid ethyl esters (LOVAZA) 1 g capsule Take 1 g by mouth daily.       Patient Stressors: Traumatic event    Patient Strengths: Communication skills   Treatment Modalities: Medication Management, Group therapy, Case management,  1 to 1 session with clinician, Psychoeducation, Recreational therapy.   Physician Treatment Plan for  Primary Diagnosis: Delusional disorder [facility]) Long Term Goal(s): Improvement in symptoms so as ready for discharge   Short Term Goals: Ability to identify changes in lifestyle to reduce recurrence of condition will improve Ability to verbalize feelings will improve Ability to disclose and discuss suicidal ideas Ability to demonstrate self-control will improve Ability to identify and develop effective coping behaviors will improve  Medication Management: Evaluate patient's response, side effects, and tolerance of medication regimen.  Therapeutic Interventions: 1 to 1 sessions, Unit Group sessions and Medication administration.  Evaluation of Outcomes: Progressing  Physician Treatment Plan for Secondary Diagnosis: Principal Problem:   Delusional disorder (HCC) Active Problems:   Laceration of left eyebrow  Long Term Goal(s): Improvement in symptoms so as ready for discharge   Short Term Goals: Ability to identify changes in lifestyle to reduce recurrence of condition will improve Ability to verbalize feelings will improve Ability to disclose and discuss suicidal ideas Ability to demonstrate self-control will improve Ability to identify and develop effective coping behaviors will improve     Medication Management: Evaluate patient's response, side effects, and tolerance of medication regimen.  Therapeutic Interventions: 1 to 1 sessions, Unit Group sessions and Medication administration.  Evaluation of Outcomes: Not Progressing   RN Treatment Plan for Primary Diagnosis: Delusional disorder [facility]) Long Term Goal(s): Knowledge of disease and therapeutic regimen to maintain health will improve  Short  Term Goals: Ability to remain free from injury will improve, Ability to verbalize frustration and anger appropriately will improve, Ability to demonstrate self-control, Ability to participate in decision making will improve, Ability to verbalize feelings will improve, Ability to disclose and  discuss suicidal ideas, Ability to identify and develop effective coping behaviors will improve, and Compliance with prescribed medications will improve  Medication Management: RN will administer medications as ordered by provider, will assess and evaluate patient's response and provide education to patient for prescribed medication. RN will report any adverse and/or side effects to prescribing provider.  Therapeutic Interventions: 1 on 1 counseling sessions, Psychoeducation, Medication administration, Evaluate responses to treatment, Monitor vital signs and CBGs as ordered, Perform/monitor CIWA, COWS, AIMS and Fall Risk screenings as ordered, Perform wound care treatments as ordered.  Evaluation of Outcomes: Progressing   LCSW Treatment Plan for Primary Diagnosis: Delusional disorder [facility]) Long Term Goal(s): Safe transition to appropriate next level of care at discharge, Engage patient in therapeutic group addressing interpersonal concerns.  Short Term Goals: Engage patient in aftercare planning with referrals and resources, Increase social support, Increase ability to appropriately verbalize feelings, Increase emotional regulation, Facilitate acceptance of mental health diagnosis and concerns, Facilitate patient progression through stages of change regarding substance use diagnoses and concerns, Identify triggers associated with mental health/substance abuse issues, and Increase skills for wellness and recovery  Therapeutic Interventions: Assess for all discharge needs, 1 to 1 time with Social worker, Explore available resources and support systems, Assess for adequacy in community support network, Educate family and significant other(s) on suicide prevention, Complete Psychosocial Assessment, Interpersonal group therapy.  Evaluation of Outcomes: Progressing   Progress in Treatment: Attending groups: Yes. and No. Participating in groups: Yes. and No. Taking medication as prescribed:  Yes. Toleration medication: Yes. Family/Significant other contact made: Yes, individual(s) contacted:  [name], borther, [phone]  Patient understands diagnosis: No. Discussing patient identified problems/goals with staff: Yes. Medical problems stabilized or resolved: Yes. Denies suicidal/homicidal ideation: Yes. Issues/concerns per patient self-inventory: No. Other: None   New problem(s) identified: No, Describe:  05/14/24 Update: None  05/24/24 Update: No changes at this time.  Update 05/29/2024:  No changes at this time.  Update 06/03/2024:  No changes at this time. Update 06/08/24: No changes at this time Update 06/13/24: No changes at this time  Update 06/18/24: No changes at this time Update 06/23/24: No changes at this time  Update 06/28/24: No changes at this time Update 07/03/24: No changes at this time Update 07/09/24: No changes at this time            New Short Term/Long Term Goal(s): elimination of symptoms of psychosis, medication management for mood stabilization; elimination of SI thoughts; development of comprehensive mental wellness plan. Update 04/02/24: No changes at this time. Update 04/07/24: No changes at this time. Update 04/12/24: No changes at this time Update 04/18/24: No changes at this time  Update 04/23/24: No changes at this time. Update 04/28/2024: No changes at this time.  Update 05/04/2024: No changes at this time.  Update 05/09/2024:  No changes at this time. 05/14/24 Update: No changes at this time. 05/19/24 Update: No changes at this time. 05/24/24 Update: No changes at this time. Update 05/29/2024:  No changes at this time. Update 06/03/2024:  No changes at this time. Update 06/08/24: No changes at this time Update 06/13/24: No changes at this time Update 06/18/24: No changes at this time Update 06/23/24: No changes at this time Update 06/28/24: No changes  at this time  Update 07/03/24: No changes at this time Update 07/09/24: No changes at this time       Patient Goals:  Get out of here, I want to get healed up so I can get out of here Update 04/02/24: No changes at this time. Update 04/07/24: No changes at this time .Update 04/12/24: No changes at this time Update 04/18/24: No changes at this time  Update 04/23/24: No changes at this time. Update 04/28/2024: No changes at this time. Update 05/04/2024: No changes at this time.   Update 05/29/2024:  No changes at this time.  Update 06/03/2024:  No changes at this time. Update 06/08/24: No changes at this time Update 06/13/24: No changes at this time Update 06/18/24: No changes at this time Update 06/23/24: No changes at this time Update 06/28/24: No changes at this time  Update 07/03/24: No changes at this time Update 07/09/24: No changes at this time      Discharge Plan or Barriers: CSW will assist with appropriate discharge planning  Update 04/02/24: No changes at this time. Update 04/07/24: No changes at this time.Update 04/12/24: No changes at this time Update 04/18/24: CSW submitted report to APS. Care team looking at guardianship for pt at this time  Update 04/23/24: No changes at this time. Update 04/28/2024: No changes at this time. Update 05/04/2024: No changes at this time.  Update 05/09/2024:  [name] APS continues to search for placement. 05/14/24 Update: [facility] APS continues to look for placement. CSW to continue to assess. 05/19/24 Update: No changes at this time. 05/24/24 Update:DSS continues to look for placement at this time Update 05/29/2024:  Patient remains on the unit and safe at this time.  Case was accepted by [facility] and they are looking for placement for the patient.   Update 06/03/2024:  No changes at this time. Update 06/08/24: No changes at this time Update 06/13/24: [facility] APS having difficulty connecting with pt's care coordinator at [name] to assist with placement Update 06/18/24: No changes at this time Update 06/23/24: Arch reports that she may  have found placement for pt. CSW will send TB tests per her request Update 06/28/24: Arch reports pt currently awaiting approval of 1915i. Reports she should know more about placement next week.  Update 07/03/24: No changes at this time Update 07/09/24: Pt still pending placement according to APS. Pt's 1915i is also still pending according to APS.     Reason for Continuation of Hospitalization: Delusions  Medication stabilization   Estimated Length of Stay: 1 to 7 days Update 04/02/24: TBD. Update 04/07/24: TBD Update 04/12/24:TBD. Update 04/28/24:TBD Update 05/04/2024: TBD Update 05/09/2024:  TBD  05/14/24 Update: TBD. 05/19/24 Update: TBD. 05/24/24 Update: TBD  Update 05/29/2024:  TBD.  Update 06/03/2024:  TBD Update 06/08/24: TBD Update 06/13/24: TBD Update 06/18/24: TBD Update 06/23/24: TBD Update 06/28/24: TBD Update 07/03/24: TBD Update 07/09/24: TBD   Last 3 [place] Suicide Severity Risk Score: Flowsheet Row Admission (Current) from 03/27/2024 in [facility] ED from 03/26/2024 in [facility] Emergency Department at [facility] ED from 03/25/2024 in [facility]  C-SSRS RISK CATEGORY No Risk No Risk No Risk    Last [facility] 2/9 Scores:     No data to display          Scribe for Treatment Team: [name], ISRA[place] 07/09/2024 9:50 AM

## 2024-07-09 NOTE — Group Note (Signed)
 Physical/Occupational Therapy Group Note  Group Topic: Yoga  Group Date: 07/09/2024 Start Time: 1300 End Time: 1325 Facilitators: Shalece Staffa, Alm Hamilton, PT   Group Description: Group participated with series of yoga poses, designed to emphasize functional standing balance, core stability, generalized flexibility and overall posture.  Incorporated deep breathing techniques with poses, working to promote relaxation, mindfulness and focus with targeted activities.   Discussed benefits of yoga in improving mood and self-esteem, reducing stress and anxiety, and promoting functional strength and balance for each participant.  Discussed ways to integrate into each participant's daily routine.  Provided handout with written and pictorial descriptions of included yoga movements to be utilized as appropriate outside of group time.  Therapeutic Goal(s):  Demonstrate safe ability to participate with yoga poses during group activity. Identify one benefit of participation with yoga poses as part of each participant's exercise/movement routine. Identify 1-2 individual poses that participant feels most beneficial to his/her needs and that he/she can easily replicate outside of group.  Individual Participation: Did not attend  Participation Level:   Participation Quality:   Behavior:   Speech/Thought Process:   Affect/Mood:   Insight:   Judgement:   Modes of Intervention:   Patient Response to Interventions:    Plan: Continue to engage patient in PT/OT groups 1 - 2x/week.  CHARM Hamilton Bertin PT, DPT 07/09/24, 1:59 PM

## 2024-07-09 NOTE — Group Note (Signed)
 Date:  07/09/2024 Time:  11:18 AM  Group Topic/Focus:  Morning strech with Norleen    Participation Level:  Did Not Attend    [name] 07/09/2024, 11:18 AM

## 2024-07-09 NOTE — Plan of Care (Signed)
   Problem: Coping: Goal: Ability to demonstrate self-control will improve Outcome: Progressing   Problem: Health Behavior/Discharge Planning: Goal: Compliance with treatment plan for underlying cause of condition will improve Outcome: Progressing

## 2024-07-09 NOTE — Progress Notes (Signed)
 Mobility Specialist Progress Note:    07/09/24 1022  Mobility  Activity Ambulated with assistance  Level of Assistance Contact guard assist, steadying assist  Assistive Device Front wheel walker  Distance Ambulated (ft) 200 ft  Range of Motion/Exercises Active;All extremities  Activity Response Tolerated well  Mobility visit 1 Mobility  Mobility Specialist Start Time (ACUTE ONLY) 1000  Mobility Specialist Stop Time (ACUTE ONLY) 1013  Mobility Specialist Time Calculation (min) (ACUTE ONLY) 13 min   Pt received in bed, pleasant and agreeable to mobility. Required CGA/SBA to stand and ambulate with RW. Tolerated well, asx throughout. Returned to room, left pt sitting EOB. All needs met.  Sherrilee Ditty Mobility Specialist Please contact via Special Educational Needs Teacher or  Rehab office at [phone]

## 2024-07-09 NOTE — Progress Notes (Signed)
 Behavior:  Pleasant and cooperative.     Psych assessment: Denies SI/HI.   Interaction / Group attendance:  Isolates to room.  Minimal interaction with peers and staff.    Medication/ PRNs: Compliant.  Pain: Denies  15 min checks in place for safety.     1:1 sitter discontinued.  Bed alarm should be on at night.

## 2024-07-10 DIAGNOSIS — F22 Delusional disorders: Secondary | ICD-10-CM | POA: Diagnosis not present

## 2024-07-10 DIAGNOSIS — S01112A Laceration without foreign body of left eyelid and periocular area, initial encounter: Secondary | ICD-10-CM | POA: Diagnosis not present

## 2024-07-10 DIAGNOSIS — F0390 Unspecified dementia without behavioral disturbance: Secondary | ICD-10-CM | POA: Diagnosis not present

## 2024-07-10 NOTE — Progress Notes (Signed)
 Physical Therapy Treatment Patient Details Name: Benjamin [name] MRN: [id number] DOB: 07-Oct-1945 Today's Date: 07/10/2024   History of Present Illness Pt is a 78 y/o M admitted on 03/27/24 after jumping from a 2 story building. PT has been consulted after pt experienced a fall. PMH: arthritis, prostate CA    PT Comments  Pt with independent bed mobility and transitions to standing.  He opts to use RW in room stated he feels more comfortable with it but later in session stated he does not want to use one and plans to go slowly as he feels he was walking too fast which resulted in fall.  Pt walks in hallway and around obstacles in hallway with and without RW.  He is able to pick objects up off floor with ease but does have one LOB while placing on countertop but is able to self correct.  Attempted further balance activities but pt seemingly uninterested in tasks and opts to return to bed to rest.       If plan is discharge home, recommend the following: A little help with walking and/or transfers;A little help with bathing/dressing/bathroom   Can travel by private vehicle        Equipment Recommendations  Rolling walker (2 wheels)    Recommendations for Other Services       Precautions / Restrictions Precautions Precautions: Fall Restrictions Weight Bearing Restrictions Per Provider Order: No     Mobility  Bed Mobility Overal bed mobility: Independent               Patient Response: Cooperative  Transfers Overall transfer level: Independent                      Ambulation/Gait Ambulation/Gait assistance: Supervision, Contact guard assist Gait Distance (Feet): 300 Feet Assistive device: Rolling walker (2 wheels), None Gait Pattern/deviations: Step-through pattern, Decreased step length - right, Decreased step length - left, Decreased stride length Gait velocity: decreased     General Gait Details: gait with and without RW during session.   Stairs              Wheelchair Mobility     Tilt Bed Tilt Bed Patient Response: Cooperative  Modified Rankin (Stroke Patients Only)       Balance Overall balance assessment: Needs assistance Sitting-balance support: Feet supported Sitting balance-Leahy Scale: Normal     Standing balance support: During functional activity, No upper extremity supported Standing balance-Leahy Scale: Fair Standing balance comment: Fair standing balance with CGA                            Communication    Cognition Arousal: Alert Behavior During Therapy: Flat affect, WFL for tasks assessed/performed   PT - Cognitive impairments: No apparent impairments                       PT - Cognition Comments: pleasant throughout session Following commands: Intact      Cueing Cueing Techniques: Verbal cues, Tactile cues  Exercises Other Exercises Other Exercises: misc balance activities and tasks with and without RW support    General Comments        Pertinent Vitals/Pain      Home Living                          Prior Function  PT Goals (current goals can now be found in the care plan section) Progress towards PT goals: Progressing toward goals    Frequency    Min 2X/week      PT Plan      Co-evaluation              AM-PAC PT 6 Clicks Mobility   Outcome Measure  Help needed turning from your back to your side while in a flat bed without using bedrails?: None Help needed moving from lying on your back to sitting on the side of a flat bed without using bedrails?: None Help needed moving to and from a bed to a chair (including a wheelchair)?: None Help needed standing up from a chair using your arms (e.g., wheelchair or bedside chair)?: None Help needed to walk in hospital room?: None Help needed climbing 3-5 steps with a railing? : A Little 6 Click Score: 23    End of Session Equipment Utilized During Treatment: Gait belt Activity  Tolerance: Patient tolerated treatment well Patient left: in bed Nurse Communication: Mobility status PT Visit Diagnosis: Unsteadiness on feet (R26.81);History of falling (Z91.81)     Time: 9091-9082 PT Time Calculation (min) (ACUTE ONLY): 9 min  Charges:    $Gait Training: 8-22 mins PT General Charges $$ ACUTE PT VISIT: 1 Visit                   Lauraine Gills, PTA 07/10/24, 9:39 AM

## 2024-07-10 NOTE — Group Note (Signed)
 Date:  07/10/2024 Time:  10:44 PM  Group Topic/Focus:  Wrap-Up Group:   The focus of this group is to help patients review their daily goal of treatment and discuss progress on daily workbooks.    Participation Level:  Active  Participation Quality:  Appropriate  Affect:  Appropriate  Cognitive:  Appropriate  Insight: Appropriate  Engagement in Group:  Engaged  Modes of Intervention:  Discussion  Additional Comments:    Benjamin [name] [name] 07/10/2024, 10:44 PM

## 2024-07-10 NOTE — Group Note (Signed)
 Date:  07/10/2024 Time:  11:08 AM  Group Topic/Focus:   Exercise provides significant physical and mental health benefits for seniors, including improved bone and muscle strength, a lower risk of falls, and better management of chronic conditions like heart disease and diabetes. It also boosts cognitive function, enhances mood, and can lead to a longer, more independent life.  Participation Level:  Did Not Attend   [name] Gavel 07/10/2024, 11:08 AM

## 2024-07-10 NOTE — Group Note (Signed)
 Recreation Therapy Group Note   Group Topic:Coping Skills  Group Date: 07/10/2024 Start Time: 1400 End Time: 1455 Facilitators: Celestia Jeoffrey BRAVO, LRT, CTRS Location: Dayroom   Group Description: Mind Map.  Patient was provided a blank template of a diagram with 32 blank boxes in a tiered system, branching from the center (similar to a bubble chart). LRT directed patients to label the middle of the diagram Coping Skills. LRT and patients then came up with 8 different coping skills as examples. Pt were directed to record their coping skills in the 2nd tier boxes closest to the center.  Patients would then share their coping skills with the group as LRT wrote them out. LRT gave a handout of 99 different coping skills at the end of group. LRT and patients went outside to the courtyard for fresh air and sunlight with time remaining.   Goal Area(s) Addressed: Patients will be able to define "coping skills". Patient will identify new coping skills.  Patient will increase communication.   Affect/Mood: N/A   Participation Level: Did not attend    Clinical Observations/Individualized Feedback: Patient did not attend group.   Plan: Continue to engage patient in RT group sessions 2-3x/week.   Jeoffrey BRAVO Celestia, LRT, CTRS 07/10/2024 4:50 PM

## 2024-07-10 NOTE — Plan of Care (Signed)
  Problem: Education: Goal: Knowledge of General Education information will improve Description: Including pain rating scale, medication(s)/side effects and non-pharmacologic comfort measures Outcome: Progressing   Problem: Health Behavior/Discharge Planning: Goal: Ability to manage health-related needs will improve Outcome: Progressing   Problem: Clinical Measurements: Goal: Ability to maintain clinical measurements within normal limits will improve Outcome: Progressing Goal: Will remain free from infection Outcome: Progressing Goal: Diagnostic test results will improve Outcome: Progressing Goal: Respiratory complications will improve Outcome: Progressing Goal: Cardiovascular complication will be avoided Outcome: Progressing   Problem: Activity: Goal: Risk for activity intolerance will decrease Outcome: Progressing   Problem: Nutrition: Goal: Adequate nutrition will be maintained Outcome: Progressing   Problem: Coping: Goal: Level of anxiety will decrease Outcome: Progressing   Problem: Elimination: Goal: Will not experience complications related to bowel motility Outcome: Progressing Goal: Will not experience complications related to urinary retention Outcome: Progressing   Problem: Pain Managment: Goal: General experience of comfort will improve and/or be controlled Outcome: Progressing   Problem: Safety: Goal: Ability to remain free from injury will improve Outcome: Progressing   Problem: Skin Integrity: Goal: Risk for impaired skin integrity will decrease Outcome: Progressing   Problem: Education: Goal: Knowledge of [place] [facility] Education information/materials will improve Outcome: Progressing Goal: Emotional status will improve Outcome: Progressing Goal: Mental status will improve Outcome: Progressing Goal: Verbalization of understanding the information provided will improve Outcome: Progressing   Problem: Activity: Goal: Interest or  engagement in activities will improve Outcome: Progressing Goal: Sleeping patterns will improve Outcome: Progressing   Problem: Coping: Goal: Ability to verbalize frustrations and anger appropriately will improve Outcome: Progressing Goal: Ability to demonstrate self-control will improve Outcome: Progressing   Problem: Health Behavior/Discharge Planning: Goal: Identification of resources available to assist in meeting health care needs will improve Outcome: Progressing Goal: Compliance with treatment plan for underlying cause of condition will improve Outcome: Progressing   Problem: Physical Regulation: Goal: Ability to maintain clinical measurements within normal limits will improve Outcome: Progressing   Problem: Safety: Goal: Periods of time without injury will increase Outcome: Progressing

## 2024-07-10 NOTE — Progress Notes (Signed)
   07/09/24 2100  Psych Admission Type (Psych Patients Only)  Admission Status Involuntary  Psychosocial Assessment  Patient Complaints None  Eye Contact Fair  Facial Expression Flat  Affect Flat  Speech Soft  Interaction Minimal  Motor Activity Slow  Appearance/Hygiene In scrubs  Behavior Characteristics Cooperative  Mood Pleasant  Thought Process  Coherency WDL  Content WDL  Delusions None reported or observed  Perception WDL  Hallucination None reported or observed  Judgment Impaired  Confusion Mild  Danger to Self  Current suicidal ideation? Denies  Self-Injurious Behavior No self-injurious ideation or behavior indicators observed or expressed   Agreement Not to Harm Self Yes  Description of Agreement verbal  Danger to Others  Danger to Others None reported or observed   Mood/Behavior:  Pleasant and cooperative.    Psych assessment: Denies SI/HI and AVH.     Interaction / Group attendance:  Present in the milieu. Minimal interaction with peers and staff.     Medication/ PRNs: Compliant with scheduled medications. No prns required.   Pain: Denies  Floor mat and bed alarm in place for safety.  15 min checks in place for safety.

## 2024-07-10 NOTE — Plan of Care (Signed)
  Problem: Nutrition: Goal: Adequate nutrition will be maintained Outcome: Progressing   Problem: Education: Goal: Emotional status will improve Outcome: Progressing Goal: Mental status will improve Outcome: Progressing Goal: Verbalization of understanding the information provided will improve Outcome: Progressing

## 2024-07-10 NOTE — Progress Notes (Addendum)
 Mobility Specialist Progress Note:    07/10/24 1326  Mobility  Activity Ambulated with assistance  Level of Assistance Contact guard assist, steadying assist  Assistive Device None  Distance Ambulated (ft) 200 ft  Range of Motion/Exercises Active;All extremities  Activity Response Tolerated well  Mobility visit 1 Mobility  Mobility Specialist Start Time (ACUTE ONLY) 1310  Mobility Specialist Stop Time (ACUTE ONLY) 1320  Mobility Specialist Time Calculation (min) (ACUTE ONLY) 10 min   Pt received sitting EOB, very pleasant and agreeable to mobility. Required CGA to stand and ambulate with no AD. Tolerated well, 1 LOB during session that pt is able to self correct. Returned pt sitting EOB, all needs met.  Sherrilee Ditty Mobility Specialist Please contact via Special Educational Needs Teacher or  Rehab office at [phone]

## 2024-07-10 NOTE — Group Note (Signed)
 LCSW Group Therapy Note  Group Date: 07/10/2024 Start Time: 1300 End Time: 1400   Type of Therapy and Topic:  Group Therapy: Positive Affirmations  Participation Level:  Did Not Attend   Description of Group:   This group addressed positive affirmation towards self and others.  Patients went around the room and identified two positive things about themselves and two positive things about a peer in the room.  Patients reflected on how it felt to share something positive with others, to identify positive things about themselves, and to hear positive things from others/ Patients were encouraged to have a daily reflection of positive characteristics or circumstances.   Therapeutic Goals: Patients will verbalize two of their positive qualities Patients will demonstrate empathy for others by stating two positive qualities about a peer in the group Patients will verbalize their feelings when voicing positive self affirmations and when voicing positive affirmations of others Patients will discuss the potential positive impact on their wellness/recovery of focusing on positive traits of self and others.  Summary of Patient Progress:  Patient did not attend.   Therapeutic Modalities:   Cognitive Behavioral Therapy Motivational Interviewing    Alveta CHRISTELLA Kerns, LCSWA 07/10/2024  2:05 PM
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
## 2024-07-10 NOTE — Progress Notes (Signed)
 Behavior:  Pleasant and cooperative.   Psych assessment:  Denies SI/HI and AVH.    Interaction / Group attendance:  Present in the milieu for all meals.  Minimal, but appropriate interaction with peers and staff.  Medication/ PRNs: Compliant.  Pain: Denies  15 min checks in place for safety.    Bed alarm to be set at night.

## 2024-07-11 NOTE — Group Note (Signed)
 Date:  07/11/2024 Time:  4:15 PM  Group Topic/Focus:  Fresh air therapy with music and conversation    Participation Level:  Did Not Attend    Benjamin [name] 07/11/2024, 4:15 PM

## 2024-07-11 NOTE — Progress Notes (Signed)
 [facility] MD Progress Note  07/11/2024 1:34 PM Benjamin [name]  MRN:  [id number] Benjamin [name] is a 78 year old male who presents to the inpatient geriatric psych unit after jumping from a two story building. Patient was originally seen at [facility] who referred him to the ED who admitted him here on 03/27/2024. Patient reports that people from the [place] gang were trying to kill him and the only escape was through the bedroom window on the second floor. He currently lives in the house with [name] and his family and has been living with them for 3-4 years without any problems. On 03/19/2024 he went to the doctor and when he got back home the family wouldn't let him leave. He states that after he realized the gang was going to kill him he barricaded the door with his bed and jumped out the window. When he landed he ran away until one of his neighbors found him and called 911. Patient is admitted to [facility] unit with Q15 min safety monitoring. Multidisciplinary team approach is offered. Medication management; group/milieu therapy is offered.     Subjective: Chart is reviewed and discussed with the treatment team.  APS is working on legal guardianship and placement.  He has guardianship hearing June 19, 2024  Patient is noted to be sitting in chair.  He offers no complaints.  He reports that he is waiting for physical therapist to come and work with him and walking around.  He reports fair appetite and sleep.  He denies having any unstable gait or falls.  He denies SI/HI/plan and denies hallucinations.  Per nursing patient remains confused at baseline but is calm and cooperative.  He is attending to his ADLs with guidance and prompting.  He is taking his medications with no reported side effects.  Past Psychiatric History: see h&P  Family History:  Family History  Problem Relation Age of Onset   Colon cancer Neg Hx    Colon polyps Neg Hx    Stomach cancer Neg Hx    Esophageal  cancer Neg Hx    Social History:  Social History   Substance and Sexual Activity  Alcohol Use No     Social History   Substance and Sexual Activity  Drug Use No    Social History   Socioeconomic History   Marital status: Single    Spouse name: Not on file   Number of children: Not on file   Years of education: Not on file   Highest education level: Not on file  Occupational History   Not on file  Tobacco Use   Smoking status: Former    Types: Cigarettes   Smokeless tobacco: Never  Vaping Use   Vaping status: Never Used  Substance and Sexual Activity   Alcohol use: No   Drug use: No   Sexual activity: Not on file  Other Topics Concern   Not on file  Social History Narrative   Not on file   Social Drivers of Health   Financial Resource Strain: Not on file  Food Insecurity: No Food Insecurity (03/27/2024)   Hunger Vital Sign    Worried About Running Out of Food in the Last Year: Never true    Ran Out of Food in the Last Year: Never true  Transportation Needs: No Transportation Needs (03/27/2024)   PRAPARE - Administrator, Civil Service (Medical): No    Lack of Transportation (Non-Medical): No  Physical Activity: Not on file  Stress: Not on file  Social Connections: Moderately Integrated (03/27/2024)   Social Connection and Isolation Panel    Frequency of Communication with Friends and Family: Twice a week    Frequency of Social Gatherings with Friends and Family: Once a week    Attends Religious Services: 1 to 4 times per year    Active Member of [employer] or Organizations: No    Attends Engineer, Structural: 1 to 4 times per year    Marital Status: Divorced   Past Medical History:  Past Medical History:  Diagnosis Date   Allergy    Arthritis    back   Prostate cancer (HCC) dx'd 2010   surg only    Past Surgical History:  Procedure Laterality Date   COLONOSCOPY     EYE SURGERY     PROSTATE SURGERY      Current Medications: Current  Facility-Administered Medications  Medication Dose Route Frequency Provider Last Rate Last Admin   acetaminophen  (TYLENOL ) tablet 650 mg  650 mg Oral Q6H PRN Coleman, Carolyn H, NP       alum & mag hydroxide-simeth (MAALOX/MYLANTA) 200-200-20 MG/5ML suspension 30 mL  30 mL Oral Q4H PRN Coleman, Carolyn H, NP       docusate sodium  (COLACE) capsule 100 mg  100 mg Oral Daily Braelen Sproule, MD   100 mg at 07/11/24 0800   fluPHENAZine  (PROLIXIN ) tablet 10 mg  10 mg Oral BID Shrivastava, Aryendra, MD   10 mg at 07/11/24 0805   Or   fluPHENAZine  (PROLIXIN ) injection 5 mg  5 mg Intramuscular BID Shrivastava, Aryendra, MD       magnesium  hydroxide (MILK OF MAGNESIA) suspension 30 mL  30 mL Oral Daily PRN Mardy Elveria DEL, NP       OLANZapine  (ZYPREXA ) injection 5 mg  5 mg Intramuscular TID PRN Mardy Elveria DEL, NP   5 mg at 04/12/24 2230   OLANZapine  zydis (ZYPREXA ) disintegrating tablet 5 mg  5 mg Oral TID PRN Mardy Elveria DEL, NP       polyethylene glycol (MIRALAX  / GLYCOLAX ) packet 17 g  17 g Oral Daily PRN Donnelly Mellow, MD   17 g at 06/04/24 2123    Lab Results:  No results found for this or any previous visit (from the past 48 hours).     Blood Alcohol level:  Lab Results  Component Value Date   [facility] <15 03/26/2024    Metabolic Disorder Labs: Lab Results  Component Value Date   HGBA1C 5.6 04/02/2024   MPG 114 04/02/2024   No results found for: PROLACTIN Lab Results  Component Value Date   CHOL 146 04/02/2024   TRIG 37 04/02/2024   HDL 43 04/02/2024   CHOLHDL 3.4 04/02/2024   VLDL 7 04/02/2024   LDLCALC 96 04/02/2024    Physical Findings: AIMS:  , ,  ,  ,    CIWA:    COWS:      Psychiatric Specialty Exam:  Presentation  General Appearance:  Appropriate for Environment; Bizarre  Eye Contact: Fair  Speech: Normal rate  Speech Volume: Normal    Mood and Affect  Mood: Fine Affect: Congruent   Thought Process  Thought  Processes: Disorganized At baseline Descriptions of Associations:Intact  Orientation:Full (Time, Place and Person)  Thought Content: Paranoia at baseline Hallucinations: Denies  Ideas of Reference: Paranoia at baseline Suicidal Thoughts: Denies  Homicidal Thoughts: Denies   Sensorium  Memory: impiared  Judgment: Impaired  Insight: Shallow   Executive  Functions  Concentration: Fair  Attention Span: Fair  Recall: [employer] of Knowledge: Fair  Language: Fair   Psychomotor Activity  Psychomotor Activity: No data recorded  Musculoskeletal: Strength & Muscle Tone: within normal limits Gait & Station: normal Assets  Assets: Manufacturing Systems Engineer; Resilience    Physical Exam: Physical Exam Vitals and nursing note reviewed.    ROS Blood pressure 127/80, pulse 62, temperature 98.2 F (36.8 C), resp. rate 16, height 6' (1.829 m), weight 100 kg, SpO2 99%. Body mass index is 29.91 kg/m.  Diagnosis: Principal Problem:   Delusional disorder (HCC) Active Problems:   Laceration of left eyebrow   Major neurocognitive disorder [facility])  Major Neurocognitive disorder  Clinical Decision Making: Patient currently admitted after jumping off a two-story building in the context of possible delusions as reported as the people in the house he lived for 4 years trying to kill him.  Patient needs to be monitored closely for ongoing psychosis and paranoid delusions.  Given patient's confusion and chronic paranoia, no family support, APS has taken up guardianship and looking for placement     Safety and Monitoring:             -- InVoluntary admission to inpatient psychiatric unit for safety, stabilization and treatment             -- Daily contact with patient to assess and evaluate symptoms and progress in treatment             -- Patient's case to be discussed in multi-disciplinary team meeting             -- Observation Level: q15 minute checks             --  Vital signs:  q12 hours             -- Precautions: suicide, elopement, and assault   2. Psychiatric Diagnoses and Treatment:              Continue Prolixin  to 10 mg twice daily.  -- The risks/benefits/side-effects/alternatives to this medication were discussed in detail with the patient and time was given for questions. The patient consents to medication trial.                -- Metabolic profile and EKG monitoring obtained while on an atypical antipsychotic (BMI: Lipid Panel: HbgA1c: QTc:)              -- Encouraged patient to participate in unit milieu and in scheduled group therapies                            3. Medical Issues Being Addressed:    Fall on/8/25, CT head normal  4. Discharge Planning:   -- Social work and case management to assist with discharge planning and identification of hospital follow-up needs prior to discharge  -- Estimated LOS: 3-4 days  Allyn Foil, MD 07/11/2024, 1:34 PM

## 2024-07-11 NOTE — Plan of Care (Signed)
  Problem: Education: Goal: Knowledge of General Education information will improve Description: Including pain rating scale, medication(s)/side effects and non-pharmacologic comfort measures Outcome: Progressing   Problem: Health Behavior/Discharge Planning: Goal: Ability to manage health-related needs will improve Outcome: Progressing   Problem: Clinical Measurements: Goal: Ability to maintain clinical measurements within normal limits will improve Outcome: Progressing Goal: Will remain free from infection Outcome: Progressing Goal: Diagnostic test results will improve Outcome: Progressing Goal: Respiratory complications will improve Outcome: Progressing Goal: Cardiovascular complication will be avoided Outcome: Progressing   Problem: Activity: Goal: Risk for activity intolerance will decrease Outcome: Progressing   Problem: Nutrition: Goal: Adequate nutrition will be maintained Outcome: Progressing   Problem: Coping: Goal: Level of anxiety will decrease Outcome: Progressing   Problem: Elimination: Goal: Will not experience complications related to bowel motility Outcome: Progressing Goal: Will not experience complications related to urinary retention Outcome: Progressing   Problem: Pain Managment: Goal: General experience of comfort will improve and/or be controlled Outcome: Progressing   Problem: Safety: Goal: Ability to remain free from injury will improve Outcome: Progressing   Problem: Skin Integrity: Goal: Risk for impaired skin integrity will decrease Outcome: Progressing   Problem: Education: Goal: Knowledge of [place] [facility] Education information/materials will improve Outcome: Progressing Goal: Emotional status will improve Outcome: Progressing Goal: Mental status will improve Outcome: Progressing Goal: Verbalization of understanding the information provided will improve Outcome: Progressing   Problem: Activity: Goal: Interest or  engagement in activities will improve Outcome: Progressing Goal: Sleeping patterns will improve Outcome: Progressing   Problem: Coping: Goal: Ability to verbalize frustrations and anger appropriately will improve Outcome: Progressing Goal: Ability to demonstrate self-control will improve Outcome: Progressing   Problem: Health Behavior/Discharge Planning: Goal: Identification of resources available to assist in meeting health care needs will improve Outcome: Progressing Goal: Compliance with treatment plan for underlying cause of condition will improve Outcome: Progressing   Problem: Physical Regulation: Goal: Ability to maintain clinical measurements within normal limits will improve Outcome: Progressing   Problem: Safety: Goal: Periods of time without injury will increase Outcome: Progressing

## 2024-07-11 NOTE — BHH Counselor (Signed)
 Pt was visited by [facility] social worker [name] on 07/10/24 to complete CCA.   Arch reports that at this time 1915i is still pending for pt.   Arch reports she has found 1 placement but that they may need the 1915i.   CSW awaits word from [place] at this time regarding the progression of the 1915i and placement.

## 2024-07-11 NOTE — Progress Notes (Signed)
   07/11/24 0800  Psych Admission Type (Psych Patients Only)  Admission Status Involuntary  Psychosocial Assessment  Patient Complaints None  Eye Contact Fair  Facial Expression Flat  Affect Flat  Speech Soft  Interaction Minimal  Motor Activity Slow  Appearance/Hygiene In scrubs  Behavior Characteristics Appropriate to situation  Mood Pleasant  Aggressive Behavior  Effect No apparent injury  Thought Process  Coherency WDL  Content WDL  Delusions None reported or observed  Perception WDL  Hallucination None reported or observed  Judgment Impaired  Confusion Mild  Danger to Self  Current suicidal ideation? Denies  Description of Suicide Plan no plan; denies  Self-Injurious Behavior No self-injurious ideation or behavior indicators observed or expressed   Agreement Not to Harm Self Yes  Description of Agreement Verbal  Danger to Others  Danger to Others None reported or observed

## 2024-07-11 NOTE — Group Note (Signed)
 Date:  07/11/2024 Time:  10:26 AM  Group Topic/Focus:  Meditation therapy    Participation Level:  Did Not Attend    [name] 07/11/2024, 10:26 AM

## 2024-07-11 NOTE — Plan of Care (Signed)
  Problem: Activity: Goal: Interest or engagement in activities will improve Outcome: Progressing Goal: Sleeping patterns will improve Outcome: Progressing   Problem: Coping: Goal: Ability to verbalize frustrations and anger appropriately will improve Outcome: Progressing Goal: Ability to demonstrate self-control will improve Outcome: Progressing   Problem: Safety: Goal: Periods of time without injury will increase Outcome: Progressing

## 2024-07-11 NOTE — Progress Notes (Signed)
 Mobility Specialist Progress Note:    07/11/24 1256  Mobility  Activity Ambulated with assistance  Level of Assistance Contact guard assist, steadying assist  Assistive Device Front wheel walker  Distance Ambulated (ft) 500 ft  Range of Motion/Exercises Active;All extremities  Activity Response Tolerated well  Mobility visit 1 Mobility  Mobility Specialist Start Time (ACUTE ONLY) 1240  Mobility Specialist Stop Time (ACUTE ONLY) 1256  Mobility Specialist Time Calculation (min) (ACUTE ONLY) 16 min   Pt received sitting EOB, very pleasant and agreeable to mobility. Required CGA/SBA to stand and ambulate with RW. Tolerated well, requires verbal cues to utilize RW correctly. Returned pt sitting EOB, all needs met.  Sherrilee Ditty Mobility Specialist Please contact via Special Educational Needs Teacher or  Rehab office at [phone]

## 2024-07-11 NOTE — Progress Notes (Signed)
 SI/HI:denies  Behavior/Mood:Calm and cooperative. Flat affect. Denies anxiety and depression.  Interaction/Group:Min interaction with peers and staff. Attends group.  Medication/PRNs:Po med compliant. No PRNs given.  Pain:denies  Other: Slept 12.25 hours. Safety maintained.    07/10/24 2100  Psych Admission Type (Psych Patients Only)  Admission Status Involuntary  Psychosocial Assessment  Patient Complaints None  Eye Contact Fair  Facial Expression Flat  Affect Flat  Speech Soft  Interaction Minimal  Motor Activity Slow  Appearance/Hygiene In scrubs  Behavior Characteristics Appropriate to situation  Mood Pleasant  Thought Process  Coherency WDL  Content WDL  Delusions None reported or observed  Perception WDL  Hallucination None reported or observed  Judgment Impaired  Confusion Mild  Danger to Self  Current suicidal ideation? Denies

## 2024-07-12 DIAGNOSIS — F22 Delusional disorders: Secondary | ICD-10-CM | POA: Diagnosis not present

## 2024-07-12 DIAGNOSIS — S01112A Laceration without foreign body of left eyelid and periocular area, initial encounter: Secondary | ICD-10-CM | POA: Diagnosis not present

## 2024-07-12 DIAGNOSIS — F0390 Unspecified dementia without behavioral disturbance: Secondary | ICD-10-CM | POA: Diagnosis not present

## 2024-07-12 NOTE — Plan of Care (Signed)

## 2024-07-12 NOTE — Progress Notes (Signed)
 [facility] MD Progress Note  07/12/2024 12:29 PM Benjamin [name]  MRN:  [id number] Benjamin [name] is a 78 year old male who presents to the inpatient geriatric psych unit after jumping from a two story building. Patient was originally seen at [facility] who referred him to the ED who admitted him here on 03/27/2024. Patient reports that people from the [place] gang were trying to kill him and the only escape was through the bedroom window on the second floor. He currently lives in the house with [name] and his family and has been living with them for 3-4 years without any problems. On 03/19/2024 he went to the doctor and when he got back home the family wouldn't let him leave. He states that after he realized the gang was going to kill him he barricaded the door with his bed and jumped out the window. When he landed he ran away until one of his neighbors found him and called 911. Patient is admitted to [facility] unit with Q15 min safety monitoring. Multidisciplinary team approach is offered. Medication management; group/milieu therapy is offered.     Subjective: Chart is reviewed and discussed with the treatment team.  APS is working on legal guardianship and placement.  He has guardianship hearing June 19, 2024  Patient is noted to be resting in bed.  He reports feeling tired but did acknowledge having his breakfast today morning.  He is able to remember the provider.  He offers no complaints and denies having any physical pain or stiffness.  He is not endorsing SI/HI/plan.  Per nursing patient seems to have problems with his vision and is having problems adjusting to walking with the walker.  Will reach out to PT to look into any other kinds of walker that are comfortable for patient's height.     Psychiatric History: see h&P  Family History:  Family History  Problem Relation Age of Onset   Colon cancer Neg Hx    Colon polyps Neg Hx    Stomach cancer Neg Hx    Esophageal  cancer Neg Hx    Social History:  Social History   Substance and Sexual Activity  Alcohol Use No     Social History   Substance and Sexual Activity  Drug Use No    Social History   Socioeconomic History   Marital status: Single    Spouse name: Not on file   Number of children: Not on file   Years of education: Not on file   Highest education level: Not on file  Occupational History   Not on file  Tobacco Use   Smoking status: Former    Types: Cigarettes   Smokeless tobacco: Never  Vaping Use   Vaping status: Never Used  Substance and Sexual Activity   Alcohol use: No   Drug use: No   Sexual activity: Not on file  Other Topics Concern   Not on file  Social History Narrative   Not on file   Social Drivers of Health   Financial Resource Strain: Not on file  Food Insecurity: No Food Insecurity (03/27/2024)   Hunger Vital Sign    Worried About Running Out of Food in the Last Year: Never true    Ran Out of Food in the Last Year: Never true  Transportation Needs: No Transportation Needs (03/27/2024)   PRAPARE - Administrator, Civil Service (Medical): No    Lack of Transportation (Non-Medical): No  Physical Activity: Not on file  Stress: Not on file  Social Connections: Moderately Integrated (03/27/2024)   Social Connection and Isolation Panel    Frequency of Communication with Friends and Family: Twice a week    Frequency of Social Gatherings with Friends and Family: Once a week    Attends Religious Services: 1 to 4 times per year    Active Member of [employer] or Organizations: No    Attends Engineer, Structural: 1 to 4 times per year    Marital Status: Divorced   Past Medical History:  Past Medical History:  Diagnosis Date   Allergy    Arthritis    back   Prostate cancer (HCC) dx'd 2010   surg only    Past Surgical History:  Procedure Laterality Date   COLONOSCOPY     EYE SURGERY     PROSTATE SURGERY      Current Medications: Current  Facility-Administered Medications  Medication Dose Route Frequency Provider Last Rate Last Admin   acetaminophen  (TYLENOL ) tablet 650 mg  650 mg Oral Q6H PRN Coleman, Carolyn H, NP       alum & mag hydroxide-simeth (MAALOX/MYLANTA) 200-200-20 MG/5ML suspension 30 mL  30 mL Oral Q4H PRN Coleman, Carolyn H, NP       docusate sodium  (COLACE) capsule 100 mg  100 mg Oral Daily Jessy Cybulski, MD   100 mg at 07/12/24 9187   fluPHENAZine  (PROLIXIN ) tablet 10 mg  10 mg Oral BID Shrivastava, Aryendra, MD   10 mg at 07/12/24 0811   magnesium  hydroxide (MILK OF MAGNESIA) suspension 30 mL  30 mL Oral Daily PRN Coleman, Carolyn H, NP       OLANZapine  (ZYPREXA ) injection 5 mg  5 mg Intramuscular TID PRN Mardy Elveria DEL, NP   5 mg at 04/12/24 2230   OLANZapine  zydis (ZYPREXA ) disintegrating tablet 5 mg  5 mg Oral TID PRN Mardy Elveria DEL, NP       polyethylene glycol (MIRALAX  / GLYCOLAX ) packet 17 g  17 g Oral Daily PRN Donnelly Mellow, MD   17 g at 06/04/24 2123    Lab Results:  No results found for this or any previous visit (from the past 48 hours).     Blood Alcohol level:  Lab Results  Component Value Date   [facility] <15 03/26/2024    Metabolic Disorder Labs: Lab Results  Component Value Date   HGBA1C 5.6 04/02/2024   MPG 114 04/02/2024   No results found for: PROLACTIN Lab Results  Component Value Date   CHOL 146 04/02/2024   TRIG 37 04/02/2024   HDL 43 04/02/2024   CHOLHDL 3.4 04/02/2024   VLDL 7 04/02/2024   LDLCALC 96 04/02/2024    Physical Findings: AIMS:  , ,  ,  ,    CIWA:    COWS:      Psychiatric Specialty Exam:  Presentation  General Appearance:  Appropriate for Environment; Bizarre  Eye Contact: Fair  Speech: Normal rate  Speech Volume: Normal    Mood and Affect  Mood: Fine Affect: Congruent   Thought Process  Thought Processes: Disorganized At baseline Descriptions of Associations:Intact  Orientation:Full (Time, Place and  Person)  Thought Content: Paranoia at baseline Hallucinations: Denies  Ideas of Reference: Paranoia at baseline Suicidal Thoughts: Denies  Homicidal Thoughts: Denies   Sensorium  Memory: impiared  Judgment: Impaired  Insight: Shallow   Executive Functions  Concentration: Fair  Attention Span: Fair  Recall: [employer] of Knowledge: Fair  Language: Fair   Psychomotor  Activity  Psychomotor Activity: No data recorded  Musculoskeletal: Strength & Muscle Tone: within normal limits Gait & Station: normal Assets  Assets: Manufacturing Systems Engineer; Resilience    Physical Exam: Physical Exam Vitals and nursing note reviewed.    ROS Blood pressure 111/89, pulse 65, temperature 97.7 F (36.5 C), resp. rate 17, height 6' (1.829 m), weight 100 kg, SpO2 99%. Body mass index is 29.91 kg/m.  Diagnosis: Principal Problem:   Delusional disorder (HCC) Active Problems:   Laceration of left eyebrow   Major neurocognitive disorder [facility])  Major Neurocognitive disorder  Clinical Decision Making: Patient currently admitted after jumping off a two-story building in the context of possible delusions as reported as the people in the house he lived for 4 years trying to kill him.  Patient needs to be monitored closely for ongoing psychosis and paranoid delusions.  Given patient's confusion and chronic paranoia, no family support, APS has taken up guardianship and looking for placement     Safety and Monitoring:             -- InVoluntary admission to inpatient psychiatric unit for safety, stabilization and treatment             -- Daily contact with patient to assess and evaluate symptoms and progress in treatment             -- Patient's case to be discussed in multi-disciplinary team meeting             -- Observation Level: q15 minute checks             -- Vital signs:  q12 hours             -- Precautions: suicide, elopement, and assault   2. Psychiatric Diagnoses  and Treatment:              Continue Prolixin  to 10 mg twice daily.  -- The risks/benefits/side-effects/alternatives to this medication were discussed in detail with the patient and time was given for questions. The patient consents to medication trial.                -- Metabolic profile and EKG monitoring obtained while on an atypical antipsychotic (BMI: Lipid Panel: HbgA1c: QTc:)              -- Encouraged patient to participate in unit milieu and in scheduled group therapies                            3. Medical Issues Being Addressed:    Fall on/8/25, CT head normal  4. Discharge Planning:   -- Social work and case management to assist with discharge planning and identification of hospital follow-up needs prior to discharge  -- Estimated LOS: 3-4 days  Allyn Foil, MD 07/12/2024, 12:29 PM

## 2024-07-12 NOTE — Group Note (Signed)
 Date:  07/12/2024 Time:  10:56 AM  Group Topic/Focus:  Healthy Communication:   The focus of this group is to discuss communication, barriers to communication, as well as healthy ways to communicate with others.    Participation Level:  Did Not Attend   Benjamin [name] 07/12/2024, 10:56 AM

## 2024-07-12 NOTE — Group Note (Signed)
 Recreation Therapy Group Note   Group Topic:Health and Wellness  Group Date: 07/12/2024 Start Time: 1400 End Time: 1450 Facilitators: Celestia Jeoffrey BRAVO, LRT, CTRS Location: Courtyard  Group Description: Outdoor Recreation. Patients had the option to play corn hole, ring toss, UNO, or listening to music while outside in the courtyard getting fresh air and sunlight. Patients helped water and prune the raised garden beds. LRT and patients discussed things that they enjoy doing in their free time outside of the hospital. LRT encouraged patients to drink water after being active and getting their heart rate up.   Goal Area(s) Addressed: Patient will identify leisure interests.  Patient will practice healthy decision making. Patient will engage in recreation activity.   Affect/Mood: N/A   Participation Level: Did not attend    Clinical Observations/Individualized Feedback: Patient did not attend group.   Plan: Continue to engage patient in RT group sessions 2-3x/week.   Jeoffrey BRAVO Celestia, LRT, CTRS 07/12/2024 4:46 PM

## 2024-07-12 NOTE — Group Note (Signed)
 LCSW Group Therapy Note  Group Date: 07/12/2024 Start Time: 1315 End Time: 1345   Type of Therapy and Topic:  Group Therapy - Healthy vs Unhealthy Coping Skills  Participation Level:  Did Not Attend   Description of Group The focus of this group was to determine what unhealthy coping techniques typically are used by group members and what healthy coping techniques would be helpful in coping with various problems. Patients were guided in becoming aware of the differences between healthy and unhealthy coping techniques. Patients were asked to identify 2-3 healthy coping skills they would like to learn to use more effectively.  Therapeutic Goals Patients learned that coping is what human beings do all day long to deal with various situations in their lives Patients defined and discussed healthy vs unhealthy coping techniques Patients identified their preferred coping techniques and identified whether these were healthy or unhealthy Patients determined 2-3 healthy coping skills they would like to become more familiar with and use more often. Patients provided support and ideas to each other   Summary of Patient Progress:  X   Therapeutic Modalities Cognitive Behavioral Therapy Motivational Interviewing  [name], LCSWA 07/12/2024  1:40 PM

## 2024-07-12 NOTE — Progress Notes (Signed)
 Physical Therapy Treatment Patient Details Name: Benjamin [name] MRN: [id number] DOB: [birth year] Today's Date: 07/12/2024   History of Present Illness Pt is a 78 y/o M admitted on 03/27/24 after jumping from a 2 story building. PT has been consulted after pt experienced a fall. PMH: arthritis, prostate CA    PT Comments  Pt willing to work with PT, relatively flat affect but participates well.  Pt with baseline forward flexed posture, only able to briefly improve this with cuing and UE use on walker and quick to revert - education on benefits, consistency and did elevate walker one notch to try and facilitate.  Pt seemed to have some occasional L side neglect with spotting variable targets to reach toward, however with cuing to increase awareness he showed much improvement.  Pt did relatively well with moderate balance challenges, struggled (needing significantly increased UE support) during tandem ambulation challenges.  Pt will benefit from continued PT to address functional/balance limitations.     If plan is discharge home, recommend the following: A little help with walking and/or transfers;A little help with bathing/dressing/bathroom   Can travel by private vehicle        Equipment Recommendations  Rolling walker (2 wheels)    Recommendations for Other Services       Precautions / Restrictions Precautions Precautions: Fall Restrictions Weight Bearing Restrictions Per Provider Order: No     Mobility  Bed Mobility               General bed mobility comments: deferred, seated pre/post session    Transfers Overall transfer level: Independent Equipment used: Rolling walker (2 wheels)               General transfer comment: Transfers well independently, appropriate use of UEs    Ambulation/Gait Ambulation/Gait assistance: Supervision, Contact guard assist Gait Distance (Feet): 150 Feet Assistive device: Rolling walker (2 wheels), None         General Gait  Details: elevated walker 1 to facilitate better posture/UE use - pt tending to maintain a forward flexed hip posture.  little different in speed, cadence, confidence and stability with and w/o AD (~100 ft with walker and ~50 ft w/o)   Stairs             Wheelchair Mobility     Tilt Bed    Modified Rankin (Stroke Patients Only)       Balance Overall balance assessment: Needs assistance Sitting-balance support: Feet supported Sitting balance-Leahy Scale: Normal     Standing balance support: During functional activity, No upper extremity supported Standing balance-Leahy Scale: Good Standing balance comment: did well with balance challenges, see exercises below                            Communication Communication Communication: No apparent difficulties  Cognition Arousal: Alert Behavior During Therapy: Flat affect, WFL for tasks assessed/performed   PT - Cognitive impairments: No apparent impairments                         Following commands: Intact      Cueing Cueing Techniques: Verbal cues, Tactile cues  Exercises Other Exercises Other Exercises: heel raises 5-10 seconds (multiple bouts with and w/o UEs support), SLS with faded finger tip support (better on R), NBOS static standing EO with perturbations, shld width EC with perterbations, shld width standing reaching to moving targets in all planes (progressively further outside  BOS).  Pt had the most difficulty with HHA tandem ambulation needing mod UE support to maintain balance    General Comments        Pertinent Vitals/Pain Pain Assessment Pain Assessment: No/denies pain    Home Living                          Prior Function            PT Goals (current goals can now be found in the care plan section) Progress towards PT goals: Progressing toward goals    Frequency    Min 1X/week      PT Plan      Co-evaluation              AM-PAC PT 6 Clicks  Mobility   Outcome Measure  Help needed turning from your back to your side while in a flat bed without using bedrails?: None Help needed moving from lying on your back to sitting on the side of a flat bed without using bedrails?: None Help needed moving to and from a bed to a chair (including a wheelchair)?: None Help needed standing up from a chair using your arms (e.g., wheelchair or bedside chair)?: None Help needed to walk in hospital room?: None Help needed climbing 3-5 steps with a railing? : A Little 6 Click Score: 23    End of Session Equipment Utilized During Treatment: Gait belt Activity Tolerance: Patient tolerated treatment well Patient left: in chair Nurse Communication: Mobility status (walker implications and settings) PT Visit Diagnosis: Unsteadiness on feet (R26.81);History of falling (Z91.81)     Time: 8264-8250 PT Time Calculation (min) (ACUTE ONLY): 14 min  Charges:    $Therapeutic Activity: 8-22 mins PT General Charges $$ ACUTE PT VISIT: 1 Visit                     Carmin JONELLE Deed, DPT 07/12/2024, 6:04 PM

## 2024-07-12 NOTE — Group Note (Signed)
 Date:  07/12/2024 Time:  4:20 PM  Group Topic/Focus:  Developing a Wellness Toolbox:   The focus of this group is to help patients develop a wellness toolbox with skills and strategies to promote recovery upon discharge. Overcoming Stress:   The focus of this group is to define stress and help patients assess their triggers.    Participation Level:  Did Not Attend  Participation Quality:    Affect:    Cognitive:    Insight:   Engagement in Group:    Modes of Intervention:    Additional Comments:    [name] 07/12/2024, 4:20 PM

## 2024-07-12 NOTE — Progress Notes (Signed)
   07/11/24 2100  Psych Admission Type (Psych Patients Only)  Admission Status Involuntary  Psychosocial Assessment  Patient Complaints None  Eye Contact Fair  Facial Expression Flat  Affect Flat  Speech Soft  Interaction Minimal  Motor Activity Slow  Appearance/Hygiene In scrubs  Behavior Characteristics Appropriate to situation  Mood Pleasant  Thought Process  Coherency WDL  Content WDL  Delusions None reported or observed  Perception WDL  Hallucination None reported or observed  Judgment Impaired  Confusion Mild  Danger to Self  Current suicidal ideation? Denies

## 2024-07-13 DIAGNOSIS — F0390 Unspecified dementia without behavioral disturbance: Secondary | ICD-10-CM | POA: Diagnosis not present

## 2024-07-13 DIAGNOSIS — F22 Delusional disorders: Secondary | ICD-10-CM | POA: Diagnosis not present

## 2024-07-13 DIAGNOSIS — S01112A Laceration without foreign body of left eyelid and periocular area, initial encounter: Secondary | ICD-10-CM | POA: Diagnosis not present
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
## 2024-07-13 NOTE — Group Note (Signed)
 Date:  07/13/2024 Time:  10:56 AM  Group Topic/Focus:  Personal Choices and Values:   The focus of this group is to help patients assess and explore the importance of values in their lives, how their values affect their decisions, how they express their values and what opposes their expression.    Participation Level:  Did Not Attend   Benjamin [name] 07/13/2024, 10:56 AM

## 2024-07-13 NOTE — Plan of Care (Signed)

## 2024-07-13 NOTE — Progress Notes (Signed)
   07/13/24 1300  Psych Admission Type (Psych Patients Only)  Admission Status Involuntary  Psychosocial Assessment  Patient Complaints None  Eye Contact Fair  Facial Expression Flat  Affect Flat  Speech Soft  Interaction Minimal  Motor Activity Slow  Appearance/Hygiene In scrubs  Behavior Characteristics Appropriate to situation  Mood Pleasant  Thought Process  Coherency WDL  Content WDL  Delusions None reported or observed  Perception WDL  Hallucination None reported or observed  Judgment Impaired  Confusion Mild  Danger to Self  Current suicidal ideation? Denies  Agreement Not to Harm Self Yes  Description of Agreement verbal  Danger to Others  Danger to Others None reported or observed

## 2024-07-13 NOTE — Progress Notes (Signed)
 SI/HI: Denies  Behavior/Mood: Pt alert but forgetful. Unsteady gait noted. Pt required reminders to use the walker. Pt ambulated into the dayroom and stumbled, appears disoriented and confused. Pt states he was not using the phone. Pt was reminded it  was 0200 and that all phones are off at this time. Pt was redirected and returned to his room with no further concerns noted.   Interaction/Group: Min interactions with peers and staff.  Medication/PRNs: po med complaint. No PRNs given  Pain: Denies  Other: Falls precautions maintained. Reinforced use of walker with ambulation. Slept 8.5 hours.   07/12/24 2100  Psych Admission Type (Psych Patients Only)  Admission Status Involuntary  Psychosocial Assessment  Patient Complaints None  Eye Contact Fair  Facial Expression Flat  Affect Flat  Speech Soft  Interaction Minimal  Motor Activity Slow  Appearance/Hygiene In scrubs  Behavior Characteristics Appropriate to situation  Mood Pleasant  Thought Process  Coherency WDL  Content WDL  Delusions None reported or observed  Perception WDL  Hallucination None reported or observed  Judgment Impaired  Confusion Mild  Danger to Self  Current suicidal ideation? Denies

## 2024-07-13 NOTE — Group Note (Signed)
 Recreation Therapy Group Note   Group Topic:Leisure Education  Group Date: 07/13/2024 Start Time: 1400 End Time: 1445 Facilitators: Celestia Jeoffrey BRAVO, LRT, CTRS Location: Dayroom  Group Description: Leisure. Patients were given the option to choose from journaling, coloring, drawing, making origami, playing with playdoh, listening to music or singing karaoke. LRT and pts discussed the meaning of leisure, the importance of participating in leisure during their free time/when they're outside of the hospital, as well as how our leisure interests can also serve as coping skills.   Goal Area(s) Addressed:  Patient will identify a current leisure interest.  Patient will learn the definition of "leisure". Patient will practice making a positive decision. Patient will have the opportunity to try a new leisure activity. Patient will communicate with peers and LRT.    Affect/Mood: N/A   Participation Level: Non-verbal    Clinical Observations/Individualized Feedback: Teondre was present in group, however, was asleep sitting up duration of session.   Plan: Continue to engage patient in RT group sessions 2-3x/week.   Jeoffrey BRAVO Celestia, LRT, CTRS 07/13/2024 4:53 PM

## 2024-07-13 NOTE — Group Note (Signed)
 Date:  07/13/2024 Time:  4:20 PM  Group Topic/Focus:  Overcoming Stress:   The focus of this group is to define stress and help patients assess their triggers.    Participation Level:  Active  Participation Quality:  Appropriate  Affect:  Appropriate  Cognitive:  Appropriate  Insight: Appropriate  Engagement in Group:  Engaged  Modes of Intervention:  Discussion   Arland Nutting 07/13/2024, 4:20 PM

## 2024-07-13 NOTE — Group Note (Signed)
 Physical/Occupational Therapy Group Note  Group Topic: UE Therex   Group Date: 07/13/2024 Start Time: 1500 End Time: 1543 Facilitators: Lavonda Therisa CROME, OT   Group Description:  Group instructed in series of upper extremities exercises, aimed to promote strength, flexibility, range of motion and functional endurance.  Patients provided cuing for proper mechanics and proper pace of exercise; exercises adjusted as necessary for individualized patient needs.  Patient also engaged in cognitive components throughout session, working to integrate attention to task, command following, turn-taking and appropriate social interaction throughout session.  Allowed to ask questions as appropriate, and encouraged to identify specific exercises that they could complete independently outside of group sessions.     Therapeutic Goal(s):  * Demonstrate appropriate performance of upper extremity exercises to promote strength, flexibility, range of motion and functional endurance  * Identify 2-3 specific upper extremity exercises to complete as home exercise program outside of group session   Individual Participation: Pt did not attend group despite encouragement   Participation Level: Did not attend   Participation Quality:    Behavior:    Speech/Thought Process:    Affect/Mood:    Insight:    Judgement:    Modes of Intervention:   Patient Response to Interventions:     Plan: Continue to engage patient in PT/OT groups 1 - 2x/week.  07/13/2024  Therisa CROME Lavonda, OT   Therisa Lavonda, OTD OTR/L  07/13/24, 4:06 PM

## 2024-07-14 NOTE — Plan of Care (Signed)
   Problem: Coping: Goal: Level of anxiety will decrease Outcome: Progressing

## 2024-07-14 NOTE — BH IP Treatment Plan (Signed)
 Interdisciplinary Treatment and Diagnostic Plan Update  07/14/2024 Time of Session: 4:51 pm Benjamin [name] MRN: [id number]  Principal Diagnosis: Delusional disorder [facility])  Secondary Diagnoses: Principal Problem:   Delusional disorder (HCC) Active Problems:   Laceration of left eyebrow   Major neurocognitive disorder (HCC)   Current Medications:  Current Facility-Administered Medications  Medication Dose Route Frequency Provider Last Rate Last Admin   acetaminophen  (TYLENOL ) tablet 650 mg  650 mg Oral Q6H PRN Coleman, Carolyn H, NP       alum & mag hydroxide-simeth (MAALOX/MYLANTA) 200-200-20 MG/5ML suspension 30 mL  30 mL Oral Q4H PRN Coleman, Carolyn H, NP       docusate sodium  (COLACE) capsule 100 mg  100 mg Oral Daily Jadapalle, Sree, MD   100 mg at 07/14/24 9040   fluPHENAZine  (PROLIXIN ) tablet 10 mg  10 mg Oral BID Shrivastava, Aryendra, MD   10 mg at 07/14/24 0816   magnesium  hydroxide (MILK OF MAGNESIA) suspension 30 mL  30 mL Oral Daily PRN Coleman, Carolyn H, NP       OLANZapine  (ZYPREXA ) injection 5 mg  5 mg Intramuscular TID PRN Mardy Elveria DEL, NP   5 mg at 04/12/24 2230   OLANZapine  zydis (ZYPREXA ) disintegrating tablet 5 mg  5 mg Oral TID PRN Coleman, Carolyn H, NP       polyethylene glycol (MIRALAX  / GLYCOLAX ) packet 17 g  17 g Oral Daily PRN Jadapalle, Sree, MD   17 g at 06/04/24 2123   PTA Medications: Medications Prior to Admission  Medication Sig Dispense Refill Last Dose/Taking   docusate sodium  (COLACE) 100 MG capsule Take 100 mg by mouth daily.      omega-3 acid ethyl esters (LOVAZA) 1 g capsule Take 1 g by mouth daily.       Patient Stressors: Traumatic event    Patient Strengths: Communication skills   Treatment Modalities: Medication Management, Group therapy, Case management,  1 to 1 session with clinician, Psychoeducation, Recreational therapy.   Physician Treatment Plan for Primary Diagnosis: Delusional disorder [facility]) Long Term Goal(s):  Improvement in symptoms so as ready for discharge   Short Term Goals: Ability to identify changes in lifestyle to reduce recurrence of condition will improve Ability to verbalize feelings will improve Ability to disclose and discuss suicidal ideas Ability to demonstrate self-control will improve Ability to identify and develop effective coping behaviors will improve  Medication Management: Evaluate patient's response, side effects, and tolerance of medication regimen.  Therapeutic Interventions: 1 to 1 sessions, Unit Group sessions and Medication administration.  Evaluation of Outcomes: Progressing  Physician Treatment Plan for Secondary Diagnosis: Principal Problem:   Delusional disorder (HCC) Active Problems:   Laceration of left eyebrow   Major neurocognitive disorder (HCC)  Long Term Goal(s): Improvement in symptoms so as ready for discharge   Short Term Goals: Ability to identify changes in lifestyle to reduce recurrence of condition will improve Ability to verbalize feelings will improve Ability to disclose and discuss suicidal ideas Ability to demonstrate self-control will improve Ability to identify and develop effective coping behaviors will improve     Medication Management: Evaluate patient's response, side effects, and tolerance of medication regimen.  Therapeutic Interventions: 1 to 1 sessions, Unit Group sessions and Medication administration.  Evaluation of Outcomes: Progressing   RN Treatment Plan for Primary Diagnosis: Delusional disorder (HCC) Long Term Goal(s): Knowledge of disease and therapeutic regimen to maintain health will improve  Short Term Goals: Ability to remain free from injury will improve, Ability to  verbalize frustration and anger appropriately will improve, Ability to demonstrate self-control, Ability to participate in decision making will improve, Ability to verbalize feelings will improve, Ability to disclose and discuss suicidal ideas,  Ability to identify and develop effective coping behaviors will improve, and Compliance with prescribed medications will improve  Medication Management: RN will administer medications as ordered by provider, will assess and evaluate patient's response and provide education to patient for prescribed medication. RN will report any adverse and/or side effects to prescribing provider.  Therapeutic Interventions: 1 on 1 counseling sessions, Psychoeducation, Medication administration, Evaluate responses to treatment, Monitor vital signs and CBGs as ordered, Perform/monitor CIWA, COWS, AIMS and Fall Risk screenings as ordered, Perform wound care treatments as ordered.  Evaluation of Outcomes: Progressing   LCSW Treatment Plan for Primary Diagnosis: Delusional disorder [facility]) Long Term Goal(s): Safe transition to appropriate next level of care at discharge, Engage patient in therapeutic group addressing interpersonal concerns.  Short Term Goals: Engage patient in aftercare planning with referrals and resources, Increase social support, Increase ability to appropriately verbalize feelings, Increase emotional regulation, Facilitate acceptance of mental health diagnosis and concerns, and Increase skills for wellness and recovery  Therapeutic Interventions: Assess for all discharge needs, 1 to 1 time with Social worker, Explore available resources and support systems, Assess for adequacy in community support network, Educate family and significant other(s) on suicide prevention, Complete Psychosocial Assessment, Interpersonal group therapy.  Evaluation of Outcomes: Progressing   Progress in Treatment: Attending groups: No. Participating in groups: No. Taking medication as prescribed: Yes. Toleration medication: Yes. Family/Significant other contact made: Yes, individual(s) contacted:   Benjamin [name], borther, [id number] Patient understands diagnosis: No. Discussing patient identified problems/goals  with staff: Yes. Medical problems stabilized or resolved: Yes. Denies suicidal/homicidal ideation: Yes. Issues/concerns per patient self-inventory: No. Other: None  New problem(s) identified: No, Describe:  05/14/24 Update: None  05/24/24 Update: No changes at this time.  Update 05/29/2024:  No changes at this time.  Update 06/03/2024:  No changes at this time. Update 06/08/24: No changes at this time Update 06/13/24: No changes at this time  Update 06/18/24: No changes at this time Update 06/23/24: No changes at this time  Update 06/28/24: No changes at this time Update 07/03/24: No changes at this time Update 07/09/24: No changes at this time Update 07/14/24: No changes at this time           New Short Term/Long Term Goal(s): elimination of symptoms of psychosis, medication management for mood stabilization; elimination of SI thoughts; development of comprehensive mental wellness plan. Update 04/02/24: No changes at this time. Update 04/07/24: No changes at this time. Update 04/12/24: No changes at this time Update 04/18/24: No changes at this time  Update 04/23/24: No changes at this time. Update 04/28/2024: No changes at this time.  Update 05/04/2024: No changes at this time.  Update 05/09/2024:  No changes at this time. 05/14/24 Update: No changes at this time. 05/19/24 Update: No changes at this time. 05/24/24 Update: No changes at this time. Update 05/29/2024:  No changes at this time. Update 06/03/2024:  No changes at this time. Update 06/08/24: No changes at this time Update 06/13/24: No changes at this time Update 06/18/24: No changes at this time Update 06/23/24: No changes at this time Update 06/28/24: No changes at this time  Update 07/03/24: No changes at this time Update 07/09/24: No changes at this time Update 07/14/24: No changes at this time     Patient Goals:  Get out of here, I want to get healed up so I can get out of here Update 04/02/24: No changes at this time. Update 04/07/24: No changes  at this time .Update 04/12/24: No changes at this time Update 04/18/24: No changes at this time  Update 04/23/24: No changes at this time. Update 04/28/2024: No changes at this time. Update 05/04/2024: No changes at this time.   Update 05/29/2024:  No changes at this time.  Update 06/03/2024:  No changes at this time. Update 06/08/24: No changes at this time Update 06/13/24: No changes at this time Update 06/18/24: No changes at this time Update 06/23/24: No changes at this time Update 06/28/24: No changes at this time  Update 07/03/24: No changes at this time Update 07/09/24: No changes at this time  Update 07/14/24: No changes at this time     Discharge Plan or Barriers: CSW will assist with appropriate discharge planning  Update 04/02/24: No changes at this time. Update 04/07/24: No changes at this time.Update 04/12/24: No changes at this time Update 04/18/24: CSW submitted report to APS. Care team looking at guardianship for pt at this time  Update 04/23/24: No changes at this time. Update 04/28/2024: No changes at this time. Update 05/04/2024: No changes at this time.  Update 05/09/2024:  [facility] APS continues to search for placement. 05/14/24 Update: [facility] APS continues to look for placement. CSW to continue to assess. 05/19/24 Update: No changes at this time. 05/24/24 Update:[facility] continues to look for placement at this time Update 05/29/2024:  Patient remains on the unit and safe at this time.  Case was accepted by APS [name] and they are looking for placement for the patient.   Update 06/03/2024:  No changes at this time. Update 06/08/24: No changes at this time Update 06/13/24: [facility] APS having difficulty connecting with pt's care coordinator at [facility] to assist with placement Update 06/18/24: No changes at this time Update 06/23/24: Arch reports that she may have found placement for pt. CSW will send TB tests per her request Update 06/28/24: Arch reports pt currently  awaiting approval of 1915i. Reports she should know more about placement next week.  Update 07/03/24: No changes at this time Update 07/09/24: Pt still pending placement according to APS. Pt's 1915i is also still pending according to APS. Update 07/14/24: No changes at this time     Reason for Continuation of Hospitalization: Delusions  Medication stabilization   Estimated Length of Stay: 1 to 7 days Update 04/02/24: TBD. Update 04/07/24: TBD Update 04/12/24:TBD. Update 04/28/24:TBD Update 05/04/2024: TBD Update 05/09/2024:  TBD  05/14/24 Update: TBD. 05/19/24 Update: TBD. 05/24/24 Update: TBD  Update 05/29/2024:  TBD.  Update 06/03/2024:  TBD Update 06/08/24: TBD Update 06/13/24: TBD Update 06/18/24: TBD Update 06/23/24: TBD Update 06/28/24: TBD Update 07/03/24: TBD Update 07/09/24: TBD  Update 07/14/24: TBD  Last 3 [place] Suicide Severity Risk Score: Flowsheet Row Admission (Current) from 03/27/2024 in [name] ED from 03/26/2024 in [facility] at [facility] ED from 03/25/2024 in [name]  C-SSRS RISK CATEGORY No Risk No Risk No Risk    Last [facility] 2/9 Scores:     No data to display          Scribe for Treatment Team: Roselyn GORMAN Lento, KEN 07/14/2024 4:51 PM

## 2024-07-14 NOTE — Progress Notes (Signed)
 D- Patient alert and oriented x 3. Pt disoriented to situation. Pt states he does not know why he is here at the hospital.  Pt presents with a pleasant mood and affect. Denies SI, HI, AVH, and pain. Encouragement provided to pt to engage with others. Pt isolated in his room most of the day.    A- Scheduled medications administered to patient, per MD orders. Support and encouragement provided.  Routine safety checks conducted every 15 minutes.  Patient informed to notify staff with problems or concerns.  R- No adverse drug reactions noted. Patient contracts for safety at this time. Patient compliant with medications and treatment plan. Patient receptive, calm, and cooperative. Patient interacts well with others on the unit.  Patient remains safe at this time.

## 2024-07-14 NOTE — Group Note (Signed)
 Date:  07/14/2024 Time:  12:27 PM  Group Topic/Focus:  Making Healthy Choices:   The focus of this group is to help patients identify negative/unhealthy choices they were using prior to admission and identify positive/healthier coping strategies to replace them upon discharge.    Participation Level:  Did Not Attend   Larrie Leita BRAVO 07/14/2024, 12:27 PM

## 2024-07-14 NOTE — Progress Notes (Signed)
 Mobility Specialist Progress Note:    07/14/24 0950  Mobility  Activity Ambulated with assistance  Level of Assistance Standby assist, set-up cues, supervision of patient - no hands on  Assistive Device Front wheel walker  Distance Ambulated (ft) 1000 ft  Range of Motion/Exercises Active;All extremities  Activity Response Tolerated well  Mobility visit 1 Mobility  Mobility Specialist Start Time (ACUTE ONLY) 0930  Mobility Specialist Stop Time (ACUTE ONLY) 0950  Mobility Specialist Time Calculation (min) (ACUTE ONLY) 20 min   Pt received sitting EOB, pleasant and agreeable to mobility. Required supervision to stand and ambulate with RW. Tolerated well, pt c/o having LBP when waking up this morning but the pain subsided. Left pt sitting EOB, all needs met.  Sherrilee Ditty Mobility Specialist Please contact via Special Educational Needs Teacher or  Rehab office at [phone]

## 2024-07-14 NOTE — Plan of Care (Signed)
  Problem: Education: Goal: Mental status will improve Outcome: Not Progressing Goal: Verbalization of understanding the information provided will improve Outcome: Not Progressing   Problem: Activity: Goal: Interest or engagement in activities will improve Outcome: Not Progressing
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
## 2024-07-14 NOTE — Group Note (Signed)
Date:  07/14/2024 Time:  4:18 PM  Group Topic/Focus:  Emotional Education:   The focus of this group is to discuss what feelings/emotions are, and how they are experienced.    Participation Level:  Did Not Attend   Arland Nutting 07/14/2024, 4:18 PM

## 2024-07-15 DIAGNOSIS — F0390 Unspecified dementia without behavioral disturbance: Secondary | ICD-10-CM | POA: Diagnosis not present

## 2024-07-15 DIAGNOSIS — F22 Delusional disorders: Secondary | ICD-10-CM | POA: Diagnosis not present

## 2024-07-15 DIAGNOSIS — S01112A Laceration without foreign body of left eyelid and periocular area, initial encounter: Secondary | ICD-10-CM | POA: Diagnosis not present

## 2024-07-15 NOTE — Group Note (Deleted)
 [facility] LCSW Group Therapy Note    Group Date: 07/15/2024 Start Time: 1000 End Time: 1030  Type of Therapy and Topic:  Group Therapy:  Overcoming Obstacles  Participation Level:  {[facility] PARTICIPATION LEVEL:22264:::1}  Mood:  Description of Group:   In this group patients will be encouraged to explore what they see as obstacles to their own wellness and recovery. They will be guided to discuss their thoughts, feelings, and behaviors related to these obstacles. The group will process together ways to cope with barriers, with attention given to specific choices patients can make. Each patient will be challenged to identify changes they are motivated to make in order to overcome their obstacles. This group will be process-oriented, with patients participating in exploration of their own experiences as well as giving and receiving support and challenge from other group members.  Therapeutic Goals: 1. Patient will identify personal and current obstacles as they relate to admission. 2. Patient will identify barriers that currently interfere with their wellness or overcoming obstacles.  3. Patient will identify feelings, thought process and behaviors related to these barriers. 4. Patient will identify two changes they are willing to make to overcome these obstacles:    Summary of Patient Progress   ***   Therapeutic Modalities:   Cognitive Behavioral Therapy Solution Focused Therapy Motivational Interviewing Relapse Prevention Therapy   Brenin Heidelberger, MD

## 2024-07-15 NOTE — Progress Notes (Signed)
 Behavior:  Pleasant and cooperative.   Psych assessment: Denies SI/HI and AVH.    Interaction / Group attendance:  Present in the milieu for meals.  Minimal interaction with staff and peers.   Medication/ PRNs: Compliant.  Pain: Denies.  15 min checks in place for safety.

## 2024-07-15 NOTE — Group Note (Signed)
 Date:  07/15/2024 Time:  4:10 PM  Group Topic/Focus:  Managing Feelings:   The focus of this group is to identify what feelings patients have difficulty handling and develop a plan to handle them in a healthier way upon discharge.    Participation Level:  Did Not Attend  Arland Nutting 07/15/2024, 4:10 PM

## 2024-07-15 NOTE — Group Note (Signed)
 Date:  07/15/2024 Time:  8:58 PM  Group Topic/Focus:  Wrap-Up Group:   The focus of this group is to help patients review their daily goal of treatment and discuss progress on daily workbooks.    Participation Level:  Did Not Attend  Participation Quality:     Affect:     Cognitive:     Insight: None  Engagement in Group:  None  Modes of Intervention:     Additional Comments:    Benjamin [name] 07/15/2024, 8:58 PM

## 2024-07-15 NOTE — Plan of Care (Signed)
  Problem: Coping: Goal: Level of anxiety will decrease Outcome: Progressing   Problem: Coping: Goal: Ability to demonstrate self-control will improve Outcome: Progressing   Problem: Health Behavior/Discharge Planning: Goal: Compliance with treatment plan for underlying cause of condition will improve Outcome: Progressing

## 2024-07-15 NOTE — Plan of Care (Signed)
   Problem: Safety: Goal: Periods of time without injury will increase Outcome: Progressing

## 2024-07-15 NOTE — Progress Notes (Signed)
   07/14/24 2000  Psych Admission Type (Psych Patients Only)  Admission Status Involuntary  Psychosocial Assessment  Patient Complaints None  Eye Contact Fair  Facial Expression Flat  Affect Blunted  Speech Unremarkable  Interaction Minimal  Motor Activity Slow  Appearance/Hygiene In scrubs  Behavior Characteristics Cooperative  Mood Pleasant  Thought Process  Coherency WDL  Content WDL  Delusions None reported or observed  Perception WDL  Hallucination None reported or observed  Judgment WDL  Confusion Mild  Danger to Self  Current suicidal ideation? Denies  Agreement Not to Harm Self Yes  Description of Agreement Verbal  Danger to Others  Danger to Others None reported or observed

## 2024-07-15 NOTE — Group Note (Signed)
 [facility] LCSW Group Therapy Note    Group Date: 07/15/2024 Start Time: 1000 End Time: 1030  Type of Therapy and Topic:  Group Therapy:  Overcoming Obstacles  Participation Level:  [facility] PARTICIPATION LEVEL: Did Not Attend  Mood:  Description of Group:   In this group patients will be encouraged to explore what they see as obstacles to their own wellness and recovery. They will be guided to discuss their thoughts, feelings, and behaviors related to these obstacles. The group will process together ways to cope with barriers, with attention given to specific choices patients can make. Each patient will be challenged to identify changes they are motivated to make in order to overcome their obstacles. This group will be process-oriented, with patients participating in exploration of their own experiences as well as giving and receiving support and challenge from other group members.  Therapeutic Goals: 1. Patient will identify personal and current obstacles as they relate to admission. 2. Patient will identify barriers that currently interfere with their wellness or overcoming obstacles.  3. Patient will identify feelings, thought process and behaviors related to these barriers. 4. Patient will identify two changes they are willing to make to overcome these obstacles:    Summary of Patient Progress  Patient did not attend group.   Therapeutic Modalities:   Cognitive Behavioral Therapy Solution Focused Therapy Motivational Interviewing Relapse Prevention Therapy   Rexene LELON Mae, LCSWA

## 2024-07-16 DIAGNOSIS — F22 Delusional disorders: Secondary | ICD-10-CM | POA: Diagnosis not present

## 2024-07-16 DIAGNOSIS — F0390 Unspecified dementia without behavioral disturbance: Secondary | ICD-10-CM | POA: Diagnosis not present

## 2024-07-16 DIAGNOSIS — S01112A Laceration without foreign body of left eyelid and periocular area, initial encounter: Secondary | ICD-10-CM | POA: Diagnosis not present

## 2024-07-16 NOTE — Group Note (Signed)
 Date:  07/16/2024 Time:  10:42 PM  Group Topic/Focus:  Wrap-Up Group:   The focus of this group is to help patients review their daily goal of treatment and discuss progress on daily workbooks.    Participation Level:  Did Not Attend  Participation Quality:     Affect:     Cognitive:     Insight: None  Engagement in Group:  None  Modes of Intervention:     Additional Comments:    Benjamin [name] [name] 07/16/2024, 10:42 PM

## 2024-07-16 NOTE — Plan of Care (Signed)
  Problem: Education: Goal: Knowledge of General Education information will improve Description: Including pain rating scale, medication(s)/side effects and non-pharmacologic comfort measures Outcome: Progressing   Problem: Health Behavior/Discharge Planning: Goal: Ability to manage health-related needs will improve Outcome: Progressing   Problem: Clinical Measurements: Goal: Ability to maintain clinical measurements within normal limits will improve Outcome: Progressing Goal: Will remain free from infection Outcome: Progressing Goal: Diagnostic test results will improve Outcome: Progressing Goal: Respiratory complications will improve Outcome: Progressing Goal: Cardiovascular complication will be avoided Outcome: Progressing   Problem: Activity: Goal: Risk for activity intolerance will decrease Outcome: Progressing   Problem: Nutrition: Goal: Adequate nutrition will be maintained Outcome: Progressing   Problem: Coping: Goal: Level of anxiety will decrease Outcome: Progressing   Problem: Elimination: Goal: Will not experience complications related to bowel motility Outcome: Progressing Goal: Will not experience complications related to urinary retention Outcome: Progressing   Problem: Pain Managment: Goal: General experience of comfort will improve and/or be controlled Outcome: Progressing   Problem: Safety: Goal: Ability to remain free from injury will improve Outcome: Progressing   Problem: Skin Integrity: Goal: Risk for impaired skin integrity will decrease Outcome: Progressing   Problem: Education: Goal: Knowledge of [place] [facility] Education information/materials will improve Outcome: Progressing Goal: Emotional status will improve Outcome: Progressing Goal: Mental status will improve Outcome: Progressing Goal: Verbalization of understanding the information provided will improve Outcome: Progressing   Problem: Activity: Goal: Interest or  engagement in activities will improve Outcome: Progressing Goal: Sleeping patterns will improve Outcome: Progressing   Problem: Coping: Goal: Ability to verbalize frustrations and anger appropriately will improve Outcome: Progressing Goal: Ability to demonstrate self-control will improve Outcome: Progressing   Problem: Health Behavior/Discharge Planning: Goal: Identification of resources available to assist in meeting health care needs will improve Outcome: Progressing Goal: Compliance with treatment plan for underlying cause of condition will improve Outcome: Progressing   Problem: Physical Regulation: Goal: Ability to maintain clinical measurements within normal limits will improve Outcome: Progressing   Problem: Safety: Goal: Periods of time without injury will increase Outcome: Progressing
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
## 2024-07-16 NOTE — Progress Notes (Signed)
 SI/HI/AVH: denies  Behavior/Mood: appropriate/pleasant  Interaction/Group attendance: minimal/1 of 3 groups    Medication/PRNs: compliant/none    Pain: denies  Other: discharge planning ongoing

## 2024-07-16 NOTE — Group Note (Signed)
 Recreation Therapy Group Note   Group Topic:Emotion Expression  Group Date: 07/16/2024 Start Time: 1510 End Time: 1615 Facilitators: Celestia Jeoffrey BRAVO, LRT, CTRS Location: Courtyard  Group Description: Music. Patients are encouraged to name their favorite song(s) for LRT to play song through speaker for group to hear, while in the courtyard getting fresh air and sunlight. Patients educated on the definition of leisure and the importance of having different leisure interests outside of the hospital. Group discussed how leisure activities can often be used as pharmacologist and that listening to music and being outside are examples.    Goal Area(s) Addressed:  Patient will identify a current leisure interest.  Patient will practice making a positive decision. Patient will have the opportunity to try a new leisure activity.   Affect/Mood: N/A   Participation Level: Did not attend    Clinical Observations/Individualized Feedback: Patient did not attend group.   Plan: Continue to engage patient in RT group sessions 2-3x/week.   Jeoffrey BRAVO Celestia, LRT, CTRS 07/16/2024 5:28 PM

## 2024-07-16 NOTE — Group Note (Signed)
 Date:  07/16/2024 Time:  5:36 PM  Group Topic/Focus:  Managing Feelings:   The focus of this group is to identify what feelings patients have difficulty handling and develop a plan to handle them in a healthier way upon discharge.    Participation Level:  Minimal  Participation Quality:  Appropriate  Affect:  Appropriate  Cognitive:  Appropriate  Insight: Appropriate  Engagement in Group:  Limited  Modes of Intervention:  Discussion  Additional Comments:    Benjamin [name] 07/16/2024, 5:36 PM

## 2024-07-16 NOTE — Plan of Care (Signed)
  Problem: Pain Managment: Goal: General experience of comfort will improve and/or be controlled Outcome: Progressing   Problem: Safety: Goal: Ability to remain free from injury will improve Outcome: Progressing

## 2024-07-16 NOTE — Progress Notes (Signed)
   07/16/24 2126  Psych Admission Type (Psych Patients Only)  Admission Status Involuntary  Psychosocial Assessment  Patient Complaints None  Eye Contact Fair  Facial Expression Flat  Affect Appropriate to circumstance  Speech Logical/coherent  Interaction Minimal  Motor Activity Slow  Appearance/Hygiene In scrubs  Behavior Characteristics Appropriate to situation;Cooperative  Mood Pleasant  Thought Process  Coherency WDL  Content WDL  Delusions None reported or observed  Perception WDL  Hallucination None reported or observed  Judgment Impaired  Confusion Mild  Danger to Self  Current suicidal ideation? Denies  Self-Injurious Behavior No self-injurious ideation or behavior indicators observed or expressed   Agreement Not to Harm Self Yes  Description of Agreement verbal  Danger to Others  Danger to Others None reported or observed   Mood/Behavior:  Pleasant and cooperative.    Psych assessment: Denies SI/HI and AVH.     Interaction / Group attendance:  Present in the milieu. Minimal interaction with peers and staff.  Attended group.   Medication/ PRNs: Compliant with scheduled medications. Required no PRNs.   Pain: Denies   15 min checks in place for safety.

## 2024-07-16 NOTE — Progress Notes (Signed)
 Mobility Specialist Progress Note:    07/16/24 1635  Mobility  Activity Ambulated with assistance  Level of Assistance Standby assist, set-up cues, supervision of patient - no hands on  Assistive Device Front wheel walker  Distance Ambulated (ft) 300 ft  Range of Motion/Exercises Active;All extremities  Activity Response Tolerated well  Mobility visit 1 Mobility  Mobility Specialist Start Time (ACUTE ONLY) 1539  Mobility Specialist Stop Time (ACUTE ONLY) 1600  Mobility Specialist Time Calculation (min) (ACUTE ONLY) 21 min   Pt received siting EOB, pleasant and agreeable to mobility. Required SBA/CGA to stand and ambulate with RW. Tolerated well, asx throughout. Returned pt sitting EOB, all needs met.  Sherrilee Ditty Mobility Specialist Please contact via Special Educational Needs Teacher or  Rehab office at [phone]

## 2024-07-16 NOTE — Group Note (Signed)
 Date:  07/16/2024 Time:  10:54 AM  Group Topic/Focus:  Goals/Gratitude Group:   The focus of this group is to help patients establish daily goals to achieve during treatment and discuss how the patient can incorporate goal setting into their daily lives to aide in recovery. Also patients watched a video on gratitude and shared their thoughts on the video.    Participation Level:  Did Not Attend   Benjamin [name] 07/16/2024, 10:54 AM

## 2024-07-16 NOTE — Progress Notes (Signed)
 [facility] MD Progress Note  07/16/2024 1:56 PM Benjamin [name]  MRN:  [id number] Benjamin [name] is a 78 year old male who presents to the inpatient geriatric psych unit after jumping from a two story building. Patient was originally seen at [name] who referred him to the ED who admitted him here on 03/27/2024. Patient reports that people from the [place] gang were trying to kill him and the only escape was through the bedroom window on the second floor. He currently lives in the house with [name] and his family and has been living with them for 3-4 years without any problems. On 03/19/2024 he went to the doctor and when he got back home the family wouldn't let him leave. He states that after he realized the gang was going to kill him he barricaded the door with his bed and jumped out the window. When he landed he ran away until one of his neighbors found him and called 911. Patient is admitted to [facility] unit with Q15 min safety monitoring. Multidisciplinary team approach is offered. Medication management; group/milieu therapy is offered.     Subjective: Chart is reviewed and discussed with the treatment team.  APS is working on legal guardianship and placement.  He has guardianship hearing June 19, 2024  Patient is noted to be resting in bed.  He reports being tired after the lunch.  Per nursing patient is more visible on the unit.  One-to-one sitter has been discontinued over the weekend and patient maintain his safety and stability in gait with no falls.  Patient is taking his medications with no reported side effects.  No behavioral problems on the unit  Psychiatric History: see h&P  Family History:  Family History  Problem Relation Age of Onset   Colon cancer Neg Hx    Colon polyps Neg Hx    Stomach cancer Neg Hx    Esophageal cancer Neg Hx    Social History:  Social History   Substance and Sexual Activity  Alcohol Use No     Social History   Substance and  Sexual Activity  Drug Use No    Social History   Socioeconomic History   Marital status: Single    Spouse name: Not on file   Number of children: Not on file   Years of education: Not on file   Highest education level: Not on file  Occupational History   Not on file  Tobacco Use   Smoking status: Former    Types: Cigarettes   Smokeless tobacco: Never  Vaping Use   Vaping status: Never Used  Substance and Sexual Activity   Alcohol use: No   Drug use: No   Sexual activity: Not on file  Other Topics Concern   Not on file  Social History Narrative   Not on file   Social Drivers of Health   Financial Resource Strain: Not on file  Food Insecurity: No Food Insecurity (03/27/2024)   Hunger Vital Sign    Worried About Running Out of Food in the Last Year: Never true    Ran Out of Food in the Last Year: Never true  Transportation Needs: No Transportation Needs (03/27/2024)   PRAPARE - Administrator, Civil Service (Medical): No    Lack of Transportation (Non-Medical): No  Physical Activity: Not on file  Stress: Not on file  Social Connections: Moderately Integrated (03/27/2024)   Social Connection and Isolation Panel    Frequency of Communication with Friends and  Family: Twice a week    Frequency of Social Gatherings with Friends and Family: Once a week    Attends Religious Services: 1 to 4 times per year    Active Member of [employer] or Organizations: No    Attends Engineer, Structural: 1 to 4 times per year    Marital Status: Divorced   Past Medical History:  Past Medical History:  Diagnosis Date   Allergy    Arthritis    back   Prostate cancer (HCC) dx'd 2010   surg only    Past Surgical History:  Procedure Laterality Date   COLONOSCOPY     EYE SURGERY     PROSTATE SURGERY      Current Medications: Current Facility-Administered Medications  Medication Dose Route Frequency Provider Last Rate Last Admin   acetaminophen  (TYLENOL ) tablet 650 mg  650  mg Oral Q6H PRN Coleman, Carolyn H, NP       alum & mag hydroxide-simeth (MAALOX/MYLANTA) 200-200-20 MG/5ML suspension 30 mL  30 mL Oral Q4H PRN Coleman, Carolyn H, NP       docusate sodium  (COLACE) capsule 100 mg  100 mg Oral Daily Hampton Cost, MD   100 mg at 07/16/24 9065   fluPHENAZine  (PROLIXIN ) tablet 10 mg  10 mg Oral BID Shrivastava, Aryendra, MD   10 mg at 07/16/24 9065   magnesium  hydroxide (MILK OF MAGNESIA) suspension 30 mL  30 mL Oral Daily PRN Mardy Elveria DEL, NP       OLANZapine  (ZYPREXA ) injection 5 mg  5 mg Intramuscular TID PRN Mardy Elveria DEL, NP   5 mg at 04/12/24 2230   OLANZapine  zydis (ZYPREXA ) disintegrating tablet 5 mg  5 mg Oral TID PRN Mardy Elveria DEL, NP       polyethylene glycol (MIRALAX  / GLYCOLAX ) packet 17 g  17 g Oral Daily PRN Donnelly Mellow, MD   17 g at 06/04/24 2123    Lab Results:  No results found for this or any previous visit (from the past 48 hours).     Blood Alcohol level:  Lab Results  Component Value Date   [facility] <15 03/26/2024    Metabolic Disorder Labs: Lab Results  Component Value Date   HGBA1C 5.6 04/02/2024   MPG 114 04/02/2024   No results found for: PROLACTIN Lab Results  Component Value Date   CHOL 146 04/02/2024   TRIG 37 04/02/2024   HDL 43 04/02/2024   CHOLHDL 3.4 04/02/2024   VLDL 7 04/02/2024   LDLCALC 96 04/02/2024    Physical Findings: AIMS:  , ,  ,  ,    CIWA:    COWS:      Psychiatric Specialty Exam:  Presentation  General Appearance:  Appropriate for Environment; Bizarre  Eye Contact: Fair  Speech: Normal rate  Speech Volume: Normal    Mood and Affect  Mood: Fine Affect: Congruent   Thought Process  Thought Processes: Disorganized At baseline Descriptions of Associations:Intact  Orientation:Full (Time, Place and Person)  Thought Content: Paranoia at baseline Hallucinations: Denies  Ideas of Reference: Paranoia at baseline Suicidal Thoughts: Denies  Homicidal  Thoughts: Denies   Sensorium  Memory: impiared  Judgment: Impaired  Insight: Shallow   Executive Functions  Concentration: Fair  Attention Span: Fair  Recall: [employer] of Knowledge: Fair  Language: Fair   Psychomotor Activity  Psychomotor Activity: No data recorded  Musculoskeletal: Strength & Muscle Tone: within normal limits Gait & Station: normal Assets  Assets: Manufacturing Systems Engineer; Resilience  Physical Exam: Physical Exam Vitals and nursing note reviewed.    ROS Blood pressure 115/76, pulse 62, temperature 97.8 F (36.6 C), resp. rate 18, height 6' (1.829 m), weight 100 kg, SpO2 98%. Body mass index is 29.91 kg/m.  Diagnosis: Principal Problem:   Delusional disorder (HCC) Active Problems:   Laceration of left eyebrow   Major neurocognitive disorder [facility])  Major Neurocognitive disorder  Clinical Decision Making: Patient currently admitted after jumping off a two-story building in the context of possible delusions as reported as the people in the house he lived for 4 years trying to kill him.  Patient needs to be monitored closely for ongoing psychosis and paranoid delusions.  Given patient's confusion and chronic paranoia, no family support, APS has taken up guardianship and looking for placement     Safety and Monitoring:             -- InVoluntary admission to inpatient psychiatric unit for safety, stabilization and treatment             -- Daily contact with patient to assess and evaluate symptoms and progress in treatment             -- Patient's case to be discussed in multi-disciplinary team meeting             -- Observation Level: q15 minute checks             -- Vital signs:  q12 hours             -- Precautions: suicide, elopement, and assault   2. Psychiatric Diagnoses and Treatment:              Continue Prolixin  to 10 mg twice daily.  -- The risks/benefits/side-effects/alternatives to this medication were discussed in  detail with the patient and time was given for questions. The patient consents to medication trial.                -- Metabolic profile and EKG monitoring obtained while on an atypical antipsychotic (BMI: Lipid Panel: HbgA1c: QTc:)              -- Encouraged patient to participate in unit milieu and in scheduled group therapies                            3. Medical Issues Being Addressed:    Fall on/8/25, CT head normal  4. Discharge Planning:   -- Social work and case management to assist with discharge planning and identification of hospital follow-up needs prior to discharge  -- Estimated LOS: 3-4 days  Allyn Foil, MD 07/16/2024, 1:56 PM

## 2024-07-17 DIAGNOSIS — F22 Delusional disorders: Secondary | ICD-10-CM | POA: Diagnosis not present

## 2024-07-17 DIAGNOSIS — S01112A Laceration without foreign body of left eyelid and periocular area, initial encounter: Secondary | ICD-10-CM | POA: Diagnosis not present

## 2024-07-17 DIAGNOSIS — F0390 Unspecified dementia without behavioral disturbance: Secondary | ICD-10-CM | POA: Diagnosis not present

## 2024-07-17 NOTE — Group Note (Signed)
 Recreation Therapy Group Note   Group Topic:Stress Management  Group Date: 07/17/2024 Start Time: 1100 End Time: 1135 Facilitators: Celestia Jeoffrey BRAVO, LRT, CTRS Location: Dayroom  Group Description: PMR (Progressive Muscle Relaxation). LRT educates patients on what PMR is and the benefits that come from it. Patients are asked to sit with their feet flat on the floor while sitting up and all the way back in their chair, if possible. LRT and pts follow a prompt through a speaker that requires you to tense and release different muscles in their body and focus on their breathing. During session, lights are off and soft music is being played. Pts are given a stress ball to use if needed.   Goal Area(s) Addressed:  Patients will be able to describe progressive muscle relaxation.  Patient will practice using relaxation technique. Patient will identify a new coping skill.  Patient will follow multistep directions to reduce anxiety and stress.   Affect/Mood: N/A   Participation Level: Non-verbal    Clinical Observations/Individualized Feedback: Benjamin [name] was present in group, however, he was asleep duration of session.   Plan: Continue to engage patient in RT group sessions 2-3x/week.   Jeoffrey BRAVO Celestia, LRT, CTRS 07/17/2024 1:47 PM

## 2024-07-17 NOTE — Group Note (Signed)
 LCSW Group Therapy Note  Group Date: 07/17/2024 Start Time: 1300 End Time: 1340   Type of Therapy and Topic:  Group Therapy - Healthy vs Unhealthy Coping Skills  Participation Level:  Did Not Attend   Description of Group The focus of this group was to determine what unhealthy coping techniques typically are used by group members and what healthy coping techniques would be helpful in coping with various problems. Patients were guided in becoming aware of the differences between healthy and unhealthy coping techniques. Patients were asked to identify 2-3 healthy coping skills they would like to learn to use more effectively.  Therapeutic Goals Patients learned that coping is what human beings do all day long to deal with various situations in their lives Patients defined and discussed healthy vs unhealthy coping techniques Patients identified their preferred coping techniques and identified whether these were healthy or unhealthy Patients determined 2-3 healthy coping skills they would like to become more familiar with and use more often. Patients provided support and ideas to each other   Summary of Patient Progress:  X  Therapeutic Modalities Cognitive Behavioral Therapy Motivational Interviewing  [name], LCSWA 07/17/2024  2:32 PM

## 2024-07-17 NOTE — Plan of Care (Signed)
   Problem: Coping: Goal: Ability to verbalize frustrations and anger appropriately will improve Outcome: Progressing Goal: Ability to demonstrate self-control will improve Outcome: Progressing

## 2024-07-17 NOTE — Progress Notes (Signed)
   07/17/24 2123  Psych Admission Type (Psych Patients Only)  Admission Status Involuntary  Psychosocial Assessment  Patient Complaints None  Eye Contact Fair  Facial Expression Flat  Affect Appropriate to circumstance  Speech Logical/coherent  Interaction Minimal  Motor Activity Slow  Appearance/Hygiene In scrubs  Behavior Characteristics Appropriate to situation;Cooperative  Mood Pleasant  Thought Process  Coherency WDL  Content WDL  Delusions None reported or observed  Perception WDL  Hallucination None reported or observed  Judgment Impaired  Confusion Mild  Danger to Self  Current suicidal ideation? Denies  Self-Injurious Behavior No self-injurious ideation or behavior indicators observed or expressed   Agreement Not to Harm Self Yes  Description of Agreement verbal  Danger to Others  Danger to Others None reported or observed    Mood/Behavior:  Pleasant and cooperative.    Psych assessment: Denies SI/HI and AVH.     Interaction / Group attendance:  Present in the milieu. Minimal interaction with peers and staff.  Attended group.   Medication/ PRNs: Compliant with scheduled medications. Required no PRNs.    Pain: Denies   15 min checks in place for safety.

## 2024-07-17 NOTE — Progress Notes (Addendum)
 Physical Therapy Treatment Patient Details Name: Benjamin [name] MRN: [id number] DOB: Jul 12, 1946 Today's Date: 07/17/2024   History of Present Illness Pt is a 78 y/o M admitted on 03/27/24 after jumping from a 2 story building. PT has been consulted after pt experienced a fall. PMH: arthritis, prostate CA    PT Comments  Pt was seated in community room/dinning hall upon arrival. He is alert and set designer appropriately. Agrees to session. Ambulated to dinning hall without AD. He was able to tolerate standing and ambulating > 600 ft without AD. Does have slightly shuffling gait due to his diabetic shoes being slightly too large but overall no intervention required. At DC, pt is encouraged to use RW for additional safety. Acute PT will sign off and defer further mobility to mobility tech team. RN staff in psych made aware of plan.    If plan is discharge home, recommend the following: Supervision due to cognitive status     Equipment Recommendations  Rolling walker (2 wheels)       Precautions / Restrictions Precautions Precautions: Fall Restrictions Weight Bearing Restrictions Per Provider Order: No     Mobility  Bed Mobility  General bed mobility comments: pt was in dinning hall/ community room  pre-session. Seated in recliner in his room post session.    Transfers Overall transfer level: Independent   Ambulation/Gait Ambulation/Gait assistance: Supervision Gait Distance (Feet): 600 Feet Assistive device: None Gait Pattern/deviations: Shuffle, Trunk flexed (pt has slightly too large Diabetic shoes) Gait velocity: decreased  General Gait Details: pt required no physical assistance to ambulate > 600 ft without AD. some staggering L/R however no intervention. Has been ambulating to/fro community room without AD.    Balance Overall balance assessment: Needs assistance Sitting-balance support: Feet supported Sitting balance-Leahy Scale: Normal     Standing balance support:  Bilateral upper extremity supported, During functional activity, Reliant on assistive device for balance Standing balance-Leahy Scale: Good     Communication Communication Communication: No apparent difficulties  Cognition Arousal: Alert Behavior During Therapy: WFL for tasks assessed/performed   PT - Cognitive impairments: No apparent impairments    PT - Cognition Comments: Pt is A and O x 2. agreeable to session and pleasant. continues to present with flat affect Following commands: Intact      Cueing Cueing Techniques: Verbal cues, Tactile cues         Pertinent Vitals/Pain Pain Assessment Pain Assessment: No/denies pain Breathing: normal     PT Goals (current goals can now be found in the care plan section) Acute Rehab PT Goals Patient Stated Goal: none stated Progress towards PT goals: Progressing toward goals    Frequency    Min 1X/week       AM-PAC PT 6 Clicks Mobility   Outcome Measure  Help needed turning from your back to your side while in a flat bed without using bedrails?: None Help needed moving from lying on your back to sitting on the side of a flat bed without using bedrails?: None Help needed moving to and from a bed to a chair (including a wheelchair)?: None Help needed standing up from a chair using your arms (e.g., wheelchair or bedside chair)?: None Help needed to walk in hospital room?: None Help needed climbing 3-5 steps with a railing? : A Little 6 Click Score: 23    End of Session   Activity Tolerance: Patient tolerated treatment well Patient left: in chair Nurse Communication: Mobility status PT Visit Diagnosis: Unsteadiness on feet (R26.81);History of  falling (Z91.81)     Time: 9189-9176 PT Time Calculation (min) (ACUTE ONLY): 13 min  Charges:    $Gait Training: 8-22 mins PT General Charges $$ ACUTE PT VISIT: 1 Visit                    Rankin Essex PTA 07/17/24, 8:42 AM

## 2024-07-17 NOTE — Plan of Care (Signed)
  Problem: Education: Goal: Knowledge of General Education information will improve Description: Including pain rating scale, medication(s)/side effects and non-pharmacologic comfort measures Outcome: Progressing   Problem: Health Behavior/Discharge Planning: Goal: Ability to manage health-related needs will improve Outcome: Progressing   Problem: Clinical Measurements: Goal: Ability to maintain clinical measurements within normal limits will improve Outcome: Progressing Goal: Will remain free from infection Outcome: Progressing Goal: Diagnostic test results will improve Outcome: Progressing Goal: Respiratory complications will improve Outcome: Progressing Goal: Cardiovascular complication will be avoided Outcome: Progressing   Problem: Activity: Goal: Risk for activity intolerance will decrease Outcome: Progressing   Problem: Nutrition: Goal: Adequate nutrition will be maintained Outcome: Progressing   Problem: Coping: Goal: Level of anxiety will decrease Outcome: Progressing   Problem: Elimination: Goal: Will not experience complications related to bowel motility Outcome: Progressing Goal: Will not experience complications related to urinary retention Outcome: Progressing   Problem: Pain Managment: Goal: General experience of comfort will improve and/or be controlled Outcome: Progressing   Problem: Safety: Goal: Ability to remain free from injury will improve Outcome: Progressing   Problem: Skin Integrity: Goal: Risk for impaired skin integrity will decrease Outcome: Progressing   Problem: Education: Goal: Knowledge of [place] [facility] Education information/materials will improve Outcome: Progressing Goal: Emotional status will improve Outcome: Progressing Goal: Mental status will improve Outcome: Progressing Goal: Verbalization of understanding the information provided will improve Outcome: Progressing   Problem: Activity: Goal: Interest or  engagement in activities will improve Outcome: Progressing Goal: Sleeping patterns will improve Outcome: Progressing   Problem: Coping: Goal: Ability to verbalize frustrations and anger appropriately will improve Outcome: Progressing Goal: Ability to demonstrate self-control will improve Outcome: Progressing   Problem: Health Behavior/Discharge Planning: Goal: Identification of resources available to assist in meeting health care needs will improve Outcome: Progressing Goal: Compliance with treatment plan for underlying cause of condition will improve Outcome: Progressing   Problem: Physical Regulation: Goal: Ability to maintain clinical measurements within normal limits will improve Outcome: Progressing   Problem: Safety: Goal: Periods of time without injury will increase Outcome: Progressing

## 2024-07-17 NOTE — Progress Notes (Signed)
 [facility] MD Progress Note  07/17/2024 12:20 PM Benjamin [name]  MRN:  [id number] Benjamin [name] is a 77 year old male who presents to the inpatient geriatric psych unit after jumping from a two story building. Patient was originally seen at [facility] who referred him to the ED who admitted him here on 03/27/2024. Patient reports that people from the [place] gang were trying to kill him and the only escape was through the bedroom window on the second floor. He currently lives in the house with [name] and his family and has been living with them for 3-4 years without any problems. On 03/19/2024 he went to the doctor and when he got back home the family wouldn't let him leave. He states that after he realized the gang was going to kill him he barricaded the door with his bed and jumped out the window. When he landed he ran away until one of his neighbors found him and called 911. Patient is admitted to [name] unit with Q15 min safety monitoring. Multidisciplinary team approach is offered. Medication management; group/milieu therapy is offered.     Subjective: Chart is reviewed and discussed with the treatment team.  APS is working on legal guardianship and placement.  He has guardianship hearing June 19, 2024  Patient is noted to be resting in bed.  He offers no complaints.  He denies feeling dizzy or any other physical complaints.  Per nursing patient takes medications and is noted to be more visible on the unit since one-to-one has been discontinued.  He continues to work with physical therapist and improving his mobility.  Patient is independent of his ADLs.  He denies SI/HI/PI and is not responding to any internal stimuli. Psychiatric History: see h&P  Family History:  Family History  Problem Relation Age of Onset   Colon cancer Neg Hx    Colon polyps Neg Hx    Stomach cancer Neg Hx    Esophageal cancer Neg Hx    Social History:  Social History   Substance and Sexual  Activity  Alcohol Use No     Social History   Substance and Sexual Activity  Drug Use No    Social History   Socioeconomic History   Marital status: Single    Spouse name: Not on file   Number of children: Not on file   Years of education: Not on file   Highest education level: Not on file  Occupational History   Not on file  Tobacco Use   Smoking status: Former    Types: Cigarettes   Smokeless tobacco: Never  Vaping Use   Vaping status: Never Used  Substance and Sexual Activity   Alcohol use: No   Drug use: No   Sexual activity: Not on file  Other Topics Concern   Not on file  Social History Narrative   Not on file   Social Drivers of Health   Financial Resource Strain: Not on file  Food Insecurity: No Food Insecurity (03/27/2024)   Hunger Vital Sign    Worried About Running Out of Food in the Last Year: Never true    Ran Out of Food in the Last Year: Never true  Transportation Needs: No Transportation Needs (03/27/2024)   PRAPARE - Administrator, Civil Service (Medical): No    Lack of Transportation (Non-Medical): No  Physical Activity: Not on file  Stress: Not on file  Social Connections: Moderately Integrated (03/27/2024)   Social Connection and Isolation Panel  Frequency of Communication with Friends and Family: Twice a week    Frequency of Social Gatherings with Friends and Family: Once a week    Attends Religious Services: 1 to 4 times per year    Active Member of [employer] or Organizations: No    Attends Engineer, Structural: 1 to 4 times per year    Marital Status: Divorced   Past Medical History:  Past Medical History:  Diagnosis Date   Allergy    Arthritis    back   Prostate cancer (HCC) dx'd 2010   surg only    Past Surgical History:  Procedure Laterality Date   COLONOSCOPY     EYE SURGERY     PROSTATE SURGERY      Current Medications: Current Facility-Administered Medications  Medication Dose Route Frequency Provider  Last Rate Last Admin   acetaminophen  (TYLENOL ) tablet 650 mg  650 mg Oral Q6H PRN Coleman, Carolyn H, NP       alum & mag hydroxide-simeth (MAALOX/MYLANTA) 200-200-20 MG/5ML suspension 30 mL  30 mL Oral Q4H PRN Coleman, Carolyn H, NP       docusate sodium  (COLACE) capsule 100 mg  100 mg Oral Daily Roosevelt Bisher, MD   100 mg at 07/17/24 9073   fluPHENAZine  (PROLIXIN ) tablet 10 mg  10 mg Oral BID Shrivastava, Aryendra, MD   10 mg at 07/17/24 0926   magnesium  hydroxide (MILK OF MAGNESIA) suspension 30 mL  30 mL Oral Daily PRN Mardy Elveria DEL, NP       OLANZapine  (ZYPREXA ) injection 5 mg  5 mg Intramuscular TID PRN Mardy Elveria DEL, NP   5 mg at 04/12/24 2230   OLANZapine  zydis (ZYPREXA ) disintegrating tablet 5 mg  5 mg Oral TID PRN Mardy Elveria DEL, NP       polyethylene glycol (MIRALAX  / GLYCOLAX ) packet 17 g  17 g Oral Daily PRN Donnelly Mellow, MD   17 g at 06/04/24 2123    Lab Results:  No results found for this or any previous visit (from the past 48 hours).     Blood Alcohol level:  Lab Results  Component Value Date   [facility] <15 03/26/2024    Metabolic Disorder Labs: Lab Results  Component Value Date   HGBA1C 5.6 04/02/2024   MPG 114 04/02/2024   No results found for: PROLACTIN Lab Results  Component Value Date   CHOL 146 04/02/2024   TRIG 37 04/02/2024   HDL 43 04/02/2024   CHOLHDL 3.4 04/02/2024   VLDL 7 04/02/2024   LDLCALC 96 04/02/2024    Physical Findings: AIMS:  , ,  ,  ,    CIWA:    COWS:      Psychiatric Specialty Exam:  Presentation  General Appearance:  Appropriate for Environment; Bizarre  Eye Contact: Fair  Speech: Normal rate  Speech Volume: Normal    Mood and Affect  Mood: Fine Affect: Congruent   Thought Process  Thought Processes: Disorganized At baseline Descriptions of Associations:Intact  Orientation:Full (Time, Place and Person)  Thought Content: Paranoia at baseline Hallucinations: Denies  Ideas of  Reference: Paranoia at baseline Suicidal Thoughts: Denies  Homicidal Thoughts: Denies   Sensorium  Memory: impiared  Judgment: Impaired  Insight: Shallow   Executive Functions  Concentration: Fair  Attention Span: Fair  Recall: [employer] of Knowledge: Fair  Language: Fair   Psychomotor Activity  Psychomotor Activity: No data recorded  Musculoskeletal: Strength & Muscle Tone: within normal limits Gait & Station: normal  Assets  Assets: Manufacturing Systems Engineer; Resilience    Physical Exam: Physical Exam Vitals and nursing note reviewed.    ROS Blood pressure 94/69, pulse 71, temperature 97.9 F (36.6 C), resp. rate 14, height 6' (1.829 m), weight 100 kg, SpO2 99%. Body mass index is 29.91 kg/m.  Diagnosis: Principal Problem:   Delusional disorder (HCC) Active Problems:   Laceration of left eyebrow   Major neurocognitive disorder [facility])  Major Neurocognitive disorder  Clinical Decision Making: Patient currently admitted after jumping off a two-story building in the context of possible delusions as reported as the people in the house he lived for 4 years trying to kill him.  Patient needs to be monitored closely for ongoing psychosis and paranoid delusions.  Given patient's confusion and chronic paranoia, no family support, APS has taken up guardianship and looking for placement     Safety and Monitoring:             -- InVoluntary admission to inpatient psychiatric unit for safety, stabilization and treatment             -- Daily contact with patient to assess and evaluate symptoms and progress in treatment             -- Patient's case to be discussed in multi-disciplinary team meeting             -- Observation Level: q15 minute checks             -- Vital signs:  q12 hours             -- Precautions: suicide, elopement, and assault   2. Psychiatric Diagnoses and Treatment:              Continue Prolixin  to 10 mg twice daily.  -- The  risks/benefits/side-effects/alternatives to this medication were discussed in detail with the patient and time was given for questions. The patient consents to medication trial.                -- Metabolic profile and EKG monitoring obtained while on an atypical antipsychotic (BMI: Lipid Panel: HbgA1c: QTc:)              -- Encouraged patient to participate in unit milieu and in scheduled group therapies                            3. Medical Issues Being Addressed:    Fall on/8/25, CT head normal  4. Discharge Planning:   -- Social work and case management to assist with discharge planning and identification of hospital follow-up needs prior to discharge  -- Estimated LOS: 3-4 days  Allyn Foil, MD 07/17/2024, 12:20 PM

## 2024-07-17 NOTE — Group Note (Signed)
 Date:  07/17/2024 Time:  11:23 AM  Group Topic/Focus:   Thanksgiving Gratitude  Gratitude helps us  slow down and see the good that's already here. Science shows that practicing gratitude can boost happiness, lower stress, and even strengthen relationships. But gratitude is more than positive thinking it's a way of shifting our perspective, one day at a time. Choosing gratitude every day is monumental in shaping the quality of your daily life for the better. But, gratitude grows best in community. Gratitude is powerful. It changes our perspective, softens our hearts, and helps us  see the good even when life feels heavy. Studies show that gratitude has measurable benefits, it boosts happiness, lowers stress, and strengthens relationships.   Participation Level:  Did Not Attend   Benjamin [name] Gavel 07/17/2024, 11:23 AM

## 2024-07-17 NOTE — Progress Notes (Signed)
   07/17/24 1400  Psych Admission Type (Psych Patients Only)  Admission Status Involuntary  Psychosocial Assessment  Patient Complaints None  Eye Contact Fair  Facial Expression Flat  Affect Appropriate to circumstance  Speech Logical/coherent  Interaction Minimal  Motor Activity Slow  Appearance/Hygiene In scrubs  Behavior Characteristics Appropriate to situation  Mood Pleasant  Thought Process  Coherency WDL  Content WDL  Delusions None reported or observed  Perception WDL  Hallucination None reported or observed  Judgment WDL  Confusion Mild  Danger to Self  Current suicidal ideation? Denies  Agreement Not to Harm Self Yes  Description of Agreement verbal  Danger to Others  Danger to Others None reported or observed

## 2024-07-17 NOTE — Group Note (Signed)
 Date:  07/17/2024 Time:  9:21 PM  Group Topic/Focus:  Wrap-Up Group:   The focus of this group is to help patients review their daily goal of treatment and discuss progress on daily workbooks.    Participation Level:  Active  Participation Quality:  Appropriate  Affect:  Appropriate  Cognitive:  Alert  Insight: Appropriate  Engagement in Group:  Engaged  Modes of Intervention:  Discussion  Additional Comments:    Benjamin [name] 07/17/2024, 9:21 PM

## 2024-07-17 NOTE — Group Note (Signed)
 Recreation Therapy Group Note   Group Topic:Health and Wellness  Group Date: 07/17/2024 Start Time: 1445 End Time: 1530 Facilitators: Celestia Jeoffrey BRAVO, LRT, CTRS Location: Courtyard  Group Description: Outdoor Recreation. Patients had the option to play corn hole, ring toss, UNO, or listening to music while outside in the courtyard getting fresh air and sunlight. Patients helped water  and prune the raised garden beds. LRT and patients discussed things that they enjoy doing in their free time outside of the hospital. LRT encouraged patients to drink water  after being active and getting their heart rate up.   Goal Area(s) Addressed: Patient will identify leisure interests.  Patient will practice healthy decision making. Patient will engage in recreation activity.   Affect/Mood: N/A   Participation Level: Did not attend    Clinical Observations/Individualized Feedback: Patient did not attend group.   Plan: Continue to engage patient in RT group sessions 2-3x/week.   Jeoffrey BRAVO Celestia, LRT, CTRS 07/17/2024 4:40 PM

## 2024-07-18 DIAGNOSIS — S01112A Laceration without foreign body of left eyelid and periocular area, initial encounter: Secondary | ICD-10-CM | POA: Diagnosis not present

## 2024-07-18 DIAGNOSIS — F22 Delusional disorders: Secondary | ICD-10-CM | POA: Diagnosis not present

## 2024-07-18 DIAGNOSIS — F0390 Unspecified dementia without behavioral disturbance: Secondary | ICD-10-CM | POA: Diagnosis not present

## 2024-07-18 NOTE — Group Note (Signed)
 Date:  07/18/2024 Time:  11:06 AM  Group Topic/Focus:    Gratitude Gratitude is the act of recognizing and acknowledging the good things that happen, resulting in a state of appreciation [employer], 2010). Often when we consider what we are grateful for, overt and profound life experiences, circumstances, and events come to mind. Gratitude, thankfulness, or gratefulness is a feeling of appreciation by a recipient of another's kindness. This kindness can be gifts, help, favors, or another form of generosity to another person. Gratitude seems to reduce depression symptoms people with a grateful mindset report higher satisfaction with life, strong social relationships and more self-esteem than those who don't practice gratitude. But it's also possible that depressed people are less likely to practice gratitude.  Participation Level:  Did Not Attend   [name] Gavel 07/18/2024, 11:06 AM

## 2024-07-18 NOTE — Progress Notes (Signed)
 [facility] MD Progress Note  07/18/2024 12:23 PM Benjamin [name]  MRN:  [id number] Benjamin [name] is a 78 year old male who presents to the inpatient geriatric psych unit after jumping from a two story building. Patient was originally seen at [facility] who referred him to the ED who admitted him here on 03/27/2024. Patient reports that people from the [place] gang were trying to kill him and the only escape was through the bedroom window on the second floor. He currently lives in the house with [name] and his family and has been living with them for 3-4 years without any problems. On 03/19/2024 he went to the doctor and when he got back home the family wouldn't let him leave. He states that after he realized the gang was going to kill him he barricaded the door with his bed and jumped out the window. When he landed he ran away until one of his neighbors found him and called 911. Patient is admitted to [facility] unit with Q15 min safety monitoring. Multidisciplinary team approach is offered. Medication management; group/milieu therapy is offered.     Subjective: Chart is reviewed and discussed with the treatment team.  APS is working on legal guardianship and placement.  He has guardianship hearing June 19, 2024  Patient is noted to be resting in bed.  He reports having his breakfast and offers no complaints.  Per nursing report patient is taking her medications.  He is noted to be sleeping more during the daytime.  Patient offers no complaints and denies SI/HI/plan and denies hallucinations. Psychiatric History: see h&P  Family History:  Family History  Problem Relation Age of Onset   Colon cancer Neg Hx    Colon polyps Neg Hx    Stomach cancer Neg Hx    Esophageal cancer Neg Hx    Social History:  Social History   Substance and Sexual Activity  Alcohol Use No     Social History   Substance and Sexual Activity  Drug Use No    Social History   Socioeconomic History    Marital status: Single    Spouse name: Not on file   Number of children: Not on file   Years of education: Not on file   Highest education level: Not on file  Occupational History   Not on file  Tobacco Use   Smoking status: Former    Types: Cigarettes   Smokeless tobacco: Never  Vaping Use   Vaping status: Never Used  Substance and Sexual Activity   Alcohol use: No   Drug use: No   Sexual activity: Not on file  Other Topics Concern   Not on file  Social History Narrative   Not on file   Social Drivers of Health   Financial Resource Strain: Not on file  Food Insecurity: No Food Insecurity (03/27/2024)   Hunger Vital Sign    Worried About Running Out of Food in the Last Year: Never true    Ran Out of Food in the Last Year: Never true  Transportation Needs: No Transportation Needs (03/27/2024)   PRAPARE - Administrator, Civil Service (Medical): No    Lack of Transportation (Non-Medical): No  Physical Activity: Not on file  Stress: Not on file  Social Connections: Moderately Integrated (03/27/2024)   Social Connection and Isolation Panel    Frequency of Communication with Friends and Family: Twice a week    Frequency of Social Gatherings with Friends and Family: Once a  week    Attends Religious Services: 1 to 4 times per year    Active Member of Clubs or Organizations: No    Attends Banker Meetings: 1 to 4 times per year    Marital Status: Divorced   Past Medical History:  Past Medical History:  Diagnosis Date   Allergy    Arthritis    back   Prostate cancer (HCC) dx'd 2010   surg only    Past Surgical History:  Procedure Laterality Date   COLONOSCOPY     EYE SURGERY     PROSTATE SURGERY      Current Medications: Current Facility-Administered Medications  Medication Dose Route Frequency Provider Last Rate Last Admin   acetaminophen  (TYLENOL ) tablet 650 mg  650 mg Oral Q6H PRN Coleman, Carolyn H, NP       alum & mag hydroxide-simeth  (MAALOX/MYLANTA) 200-200-20 MG/5ML suspension 30 mL  30 mL Oral Q4H PRN Coleman, Carolyn H, NP       docusate sodium  (COLACE) capsule 100 mg  100 mg Oral Daily Armond Cuthrell, MD   100 mg at 07/18/24 9078   fluPHENAZine  (PROLIXIN ) tablet 10 mg  10 mg Oral BID Shrivastava, Aryendra, MD   10 mg at 07/18/24 9078   magnesium  hydroxide (MILK OF MAGNESIA) suspension 30 mL  30 mL Oral Daily PRN Mardy Elveria DEL, NP       OLANZapine  (ZYPREXA ) injection 5 mg  5 mg Intramuscular TID PRN Mardy Elveria DEL, NP   5 mg at 04/12/24 2230   OLANZapine  zydis (ZYPREXA ) disintegrating tablet 5 mg  5 mg Oral TID PRN Coleman, Carolyn H, NP       polyethylene glycol (MIRALAX  / GLYCOLAX ) packet 17 g  17 g Oral Daily PRN Donnelly Mellow, MD   17 g at 06/04/24 2123    Lab Results:  No results found for this or any previous visit (from the past 48 hours).     Blood Alcohol level:  Lab Results  Component Value Date   [facility] <15 03/26/2024    Metabolic Disorder Labs: Lab Results  Component Value Date   HGBA1C 5.6 04/02/2024   MPG 114 04/02/2024   No results found for: PROLACTIN Lab Results  Component Value Date   CHOL 146 04/02/2024   TRIG 37 04/02/2024   HDL 43 04/02/2024   CHOLHDL 3.4 04/02/2024   VLDL 7 04/02/2024   LDLCALC 96 04/02/2024    Physical Findings: AIMS:  , ,  ,  ,    CIWA:    COWS:      Psychiatric Specialty Exam:  Presentation  General Appearance:  Appropriate for Environment; Bizarre  Eye Contact: Fair  Speech: Normal rate  Speech Volume: Normal    Mood and Affect  Mood: Fine Affect: Congruent   Thought Process  Thought Processes: Disorganized At baseline Descriptions of Associations:Intact  Orientation:Full (Time, Place and Person)  Thought Content: Paranoia at baseline Hallucinations: Denies  Ideas of Reference: Paranoia at baseline Suicidal Thoughts: Denies  Homicidal Thoughts: Denies   Sensorium   Memory: impiared  Judgment: Impaired  Insight: Shallow   Executive Functions  Concentration: Fair  Attention Span: Fair  Recall: [employer] of Knowledge: Fair  Language: Fair   Psychomotor Activity  Psychomotor Activity: No data recorded  Musculoskeletal: Strength & Muscle Tone: within normal limits Gait & Station: normal Assets  Assets: Manufacturing Systems Engineer; Resilience    Physical Exam: Physical Exam Vitals and nursing note reviewed.    ROS Blood  pressure 104/70, pulse 66, temperature 98.7 F (37.1 C), resp. rate 18, height 6' (1.829 m), weight 100 kg, SpO2 100%. Body mass index is 29.91 kg/m.  Diagnosis: Principal Problem:   Delusional disorder (HCC) Active Problems:   Laceration of left eyebrow   Major neurocognitive disorder [facility])  Major Neurocognitive disorder  Clinical Decision Making: Patient currently admitted after jumping off a two-story building in the context of possible delusions as reported as the people in the house he lived for 4 years trying to kill him.  Patient needs to be monitored closely for ongoing psychosis and paranoid delusions.  Given patient's confusion and chronic paranoia, no family support, APS has taken up guardianship and looking for placement     Safety and Monitoring:             -- InVoluntary admission to inpatient psychiatric unit for safety, stabilization and treatment             -- Daily contact with patient to assess and evaluate symptoms and progress in treatment             -- Patient's case to be discussed in multi-disciplinary team meeting             -- Observation Level: q15 minute checks             -- Vital signs:  q12 hours             -- Precautions: suicide, elopement, and assault   2. Psychiatric Diagnoses and Treatment:              Continue Prolixin  to 10 mg twice daily.  -- The risks/benefits/side-effects/alternatives to this medication were discussed in detail with the patient and time  was given for questions. The patient consents to medication trial.                -- Metabolic profile and EKG monitoring obtained while on an atypical antipsychotic (BMI: Lipid Panel: HbgA1c: QTc:)              -- Encouraged patient to participate in unit milieu and in scheduled group therapies                            3. Medical Issues Being Addressed:    Fall on/8/25, CT head normal  4. Discharge Planning:   -- Social work and case management to assist with discharge planning and identification of hospital follow-up needs prior to discharge  -- Estimated LOS: 3-4 days  Allyn Foil, MD 07/18/2024, 12:23 PM

## 2024-07-18 NOTE — Progress Notes (Signed)
 Mobility Specialist Progress Note:    07/18/24 1645  Mobility  Activity Refused and notified nurse if applicable   Pt kindly refused mobility, is eager for dinner and requests this dino return at a later date. All needs met.  Sherrilee Ditty Mobility Specialist Please contact via Special Educational Needs Teacher or  Rehab office at [phone]

## 2024-07-18 NOTE — Plan of Care (Signed)
   Problem: Education: Goal: Knowledge of General Education information will improve Description Including pain rating scale, medication(s)/side effects and non-pharmacologic comfort measures Outcome: Progressing

## 2024-07-18 NOTE — Plan of Care (Signed)
  Problem: Education: Goal: Knowledge of General Education information will improve Description: Including pain rating scale, medication(s)/side effects and non-pharmacologic comfort measures Outcome: Progressing   Problem: Health Behavior/Discharge Planning: Goal: Ability to manage health-related needs will improve Outcome: Progressing   Problem: Clinical Measurements: Goal: Ability to maintain clinical measurements within normal limits will improve Outcome: Progressing Goal: Will remain free from infection Outcome: Progressing Goal: Diagnostic test results will improve Outcome: Progressing Goal: Respiratory complications will improve Outcome: Progressing Goal: Cardiovascular complication will be avoided Outcome: Progressing   Problem: Activity: Goal: Risk for activity intolerance will decrease Outcome: Progressing   Problem: Nutrition: Goal: Adequate nutrition will be maintained Outcome: Progressing   Problem: Coping: Goal: Level of anxiety will decrease Outcome: Progressing   Problem: Elimination: Goal: Will not experience complications related to bowel motility Outcome: Progressing Goal: Will not experience complications related to urinary retention Outcome: Progressing   Problem: Pain Managment: Goal: General experience of comfort will improve and/or be controlled Outcome: Progressing   Problem: Safety: Goal: Ability to remain free from injury will improve Outcome: Progressing   Problem: Skin Integrity: Goal: Risk for impaired skin integrity will decrease Outcome: Progressing   Problem: Education: Goal: Knowledge of [place] [facility] Education information/materials will improve Outcome: Progressing Goal: Emotional status will improve Outcome: Progressing Goal: Mental status will improve Outcome: Progressing Goal: Verbalization of understanding the information provided will improve Outcome: Progressing   Problem: Activity: Goal: Interest or  engagement in activities will improve Outcome: Progressing Goal: Sleeping patterns will improve Outcome: Progressing   Problem: Coping: Goal: Ability to verbalize frustrations and anger appropriately will improve Outcome: Progressing Goal: Ability to demonstrate self-control will improve Outcome: Progressing   Problem: Health Behavior/Discharge Planning: Goal: Identification of resources available to assist in meeting health care needs will improve Outcome: Progressing Goal: Compliance with treatment plan for underlying cause of condition will improve Outcome: Progressing   Problem: Physical Regulation: Goal: Ability to maintain clinical measurements within normal limits will improve Outcome: Progressing   Problem: Safety: Goal: Periods of time without injury will increase Outcome: Progressing

## 2024-07-18 NOTE — Group Note (Signed)
 Date:  07/18/2024 Time:  8:43 PM  Group Topic/Focus:  Wrap-Up Group:   The focus of this group is to help patients review their daily goal of treatment and discuss progress on daily workbooks.    Participation Level:  Did Not Attend  Participation Quality:     Affect:     Cognitive:     Insight: None  Engagement in Group:     Modes of Intervention:     Additional Comments:    Benjamin [name] 07/18/2024, 8:43 PM

## 2024-07-18 NOTE — Progress Notes (Signed)
   07/18/24 2100  Psych Admission Type (Psych Patients Only)  Admission Status Involuntary  Psychosocial Assessment  Patient Complaints None  Eye Contact Fair  Facial Expression Flat  Affect Appropriate to circumstance  Speech Logical/coherent  Interaction Minimal  Motor Activity Slow  Appearance/Hygiene In scrubs  Behavior Characteristics Cooperative;Appropriate to situation  Mood Pleasant  Thought Process  Coherency WDL  Content WDL  Delusions None reported or observed  Perception WDL  Hallucination None reported or observed  Judgment WDL  Confusion Mild  Danger to Self  Current suicidal ideation? Denies  Self-Injurious Behavior No self-injurious ideation or behavior indicators observed or expressed   Agreement Not to Harm Self Yes  Description of Agreement verbal  Danger to Others  Danger to Others None reported or observed   Mood/Behavior:  Pleasant and cooperative.    Psych assessment: Denies SI/HI and AVH.     Interaction / Group attendance:  Present in the milieu. Minimal interaction with peers and staff.  Attended group.   Medication/ PRNs: Compliant with scheduled medications. Required no PRNs.    Pain: Denies   15 min checks in place for safety.

## 2024-07-19 DIAGNOSIS — F0390 Unspecified dementia without behavioral disturbance: Secondary | ICD-10-CM | POA: Diagnosis not present

## 2024-07-19 DIAGNOSIS — S01112A Laceration without foreign body of left eyelid and periocular area, initial encounter: Secondary | ICD-10-CM | POA: Diagnosis not present

## 2024-07-19 DIAGNOSIS — F22 Delusional disorders: Secondary | ICD-10-CM | POA: Diagnosis not present

## 2024-07-19 NOTE — Group Note (Signed)
 Date:  07/19/2024 Time:  3:18 PM  Group Topic/Focus:  Thanksgiving Football and conversation     Participation Level:  Did Not Attend    [name] 07/19/2024, 3:18 PM

## 2024-07-19 NOTE — Group Note (Signed)
 LCSW Group Therapy Note   Group Date: 07/19/2024 Start Time: 1300 End Time: 1330   Type of Therapy and Topic:  Group Therapy: Challenging Core Beliefs  Participation Level:  None  Description of Group:  Patients were educated about core beliefs and asked to identify one harmful core belief that they have. Patients were asked to explore from where those beliefs originate. Patients were asked to discuss how those beliefs make them feel and the resulting behaviors of those beliefs. They were then be asked if those beliefs are true and, if so, what evidence they have to support them. Lastly, group members were challenged to replace those negative core beliefs with helpful beliefs.   Therapeutic Goals:   1. Patient will identify harmful core beliefs and explore the origins of such beliefs. 2. Patient will identify feelings and behaviors that result from those core beliefs. 3. Patient will discuss whether such beliefs are true. 4.  Patient will replace harmful core beliefs with helpful ones.  Summary of Patient Progress:  X  Therapeutic Modalities: Cognitive Behavioral Therapy; Solution-Focused Therapy   Noah Pelaez D Dason Mosley, [place] 07/19/2024  1:35 PM

## 2024-07-19 NOTE — Progress Notes (Signed)
 Mobility Specialist Progress Note:    07/19/24 1446  Mobility  Activity Ambulated with assistance  Level of Assistance Contact guard assist, steadying assist  Assistive Device None  Distance Ambulated (ft) 250 ft  Range of Motion/Exercises Active;All extremities  Activity Response Tolerated well  Mobility visit 1 Mobility  Mobility Specialist Start Time (ACUTE ONLY) 1437  Mobility Specialist Stop Time (ACUTE ONLY) 1445  Mobility Specialist Time Calculation (min) (ACUTE ONLY) 8 min   Pt received in bed, agreeable to mobility. Required CGA to stand and ambulate with no AD. Tolerated well, asx throughout. Returned to room, all needs met.  Sherrilee Ditty Mobility Specialist Please contact via Special Educational Needs Teacher or  Rehab office at [phone]

## 2024-07-19 NOTE — Group Note (Signed)
 Recreation Therapy Group Note   Group Topic:Communication  Group Date: 07/19/2024 Start Time: 1210 End Time: 1240 Facilitators: Celestia Jeoffrey FORBES ARTICE, CTRS Location: Craft Room  Group Description: Gratitude Conversations. Patients and LRT discussed what gratitude means, how we can express it and what it means to us , personally. LRT gave an educational handout on the definition of gratitude as well as the benefits that come from it. LRT and patients discussed different gratitude prompts and allowed patients to join in the guided discussion. LRT and pts processed how showing gratitude towards themselves, and others can be applied to everyday life post-discharge. LRT offered journals and folders to pts afterwards.   Goal Area(s) Addressed:  Patient will identify the definition of gratitude. Patient will learn different gratitude exercises. Patient will increase communication.  Patient will identify a new coping skill.   Affect/Mood: N/A   Participation Level: Did not attend    Clinical Observations/Individualized Feedback: Patient did not attend group.   Plan: Continue to engage patient in RT group sessions 2-3x/week.   Jeoffrey FORBES Celestia, LRT, CTRS 07/19/2024 1:07 PM

## 2024-07-19 NOTE — BH IP Treatment Plan (Signed)
 Interdisciplinary Treatment and Diagnostic Plan Update  07/19/2024 Time of Session: 10:30 AM  Benjamin [name] MRN: [id number]  Principal Diagnosis: Delusional disorder [facility])  Secondary Diagnoses: Principal Problem:   Delusional disorder (HCC) Active Problems:   Laceration of left eyebrow   Major neurocognitive disorder (HCC)   Current Medications:  Current Facility-Administered Medications  Medication Dose Route Frequency Provider Last Rate Last Admin   acetaminophen  (TYLENOL ) tablet 650 mg  650 mg Oral Q6H PRN Coleman, Carolyn H, NP       alum & mag hydroxide-simeth (MAALOX/MYLANTA) 200-200-20 MG/5ML suspension 30 mL  30 mL Oral Q4H PRN Coleman, Carolyn H, NP       docusate sodium  (COLACE) capsule 100 mg  100 mg Oral Daily Jadapalle, Sree, MD   100 mg at 07/19/24 9061   fluPHENAZine  (PROLIXIN ) tablet 10 mg  10 mg Oral BID Shrivastava, Aryendra, MD   10 mg at 07/19/24 9061   magnesium  hydroxide (MILK OF MAGNESIA) suspension 30 mL  30 mL Oral Daily PRN Coleman, Carolyn H, NP       OLANZapine  (ZYPREXA ) injection 5 mg  5 mg Intramuscular TID PRN Mardy Elveria DEL, NP   5 mg at 04/12/24 2230   OLANZapine  zydis (ZYPREXA ) disintegrating tablet 5 mg  5 mg Oral TID PRN Coleman, Carolyn H, NP       polyethylene glycol (MIRALAX  / GLYCOLAX ) packet 17 g  17 g Oral Daily PRN Jadapalle, Sree, MD   17 g at 06/04/24 2123   PTA Medications: Medications Prior to Admission  Medication Sig Dispense Refill Last Dose/Taking   docusate sodium  (COLACE) 100 MG capsule Take 100 mg by mouth daily.      omega-3 acid ethyl esters (LOVAZA) 1 g capsule Take 1 g by mouth daily.       Patient Stressors: Traumatic event    Patient Strengths: Communication skills   Treatment Modalities: Medication Management, Group therapy, Case management,  1 to 1 session with clinician, Psychoeducation, Recreational therapy.   Physician Treatment Plan for Primary Diagnosis: Delusional disorder [facility]) Long Term Goal(s):  Improvement in symptoms so as ready for discharge   Short Term Goals: Ability to identify changes in lifestyle to reduce recurrence of condition will improve Ability to verbalize feelings will improve Ability to disclose and discuss suicidal ideas Ability to demonstrate self-control will improve Ability to identify and develop effective coping behaviors will improve  Medication Management: Evaluate patient's response, side effects, and tolerance of medication regimen.  Therapeutic Interventions: 1 to 1 sessions, Unit Group sessions and Medication administration.  Evaluation of Outcomes: Progressing  Physician Treatment Plan for Secondary Diagnosis: Principal Problem:   Delusional disorder (HCC) Active Problems:   Laceration of left eyebrow   Major neurocognitive disorder (HCC)  Long Term Goal(s): Improvement in symptoms so as ready for discharge   Short Term Goals: Ability to identify changes in lifestyle to reduce recurrence of condition will improve Ability to verbalize feelings will improve Ability to disclose and discuss suicidal ideas Ability to demonstrate self-control will improve Ability to identify and develop effective coping behaviors will improve     Medication Management: Evaluate patient's response, side effects, and tolerance of medication regimen.  Therapeutic Interventions: 1 to 1 sessions, Unit Group sessions and Medication administration.  Evaluation of Outcomes: Progressing   RN Treatment Plan for Primary Diagnosis: Delusional disorder (HCC) Long Term Goal(s): Knowledge of disease and therapeutic regimen to maintain health will improve  Short Term Goals: Ability to remain free from injury will improve, Ability  to verbalize frustration and anger appropriately will improve, Ability to demonstrate self-control, Ability to participate in decision making will improve, Ability to verbalize feelings will improve, Ability to disclose and discuss suicidal ideas,  Ability to identify and develop effective coping behaviors will improve, and Compliance with prescribed medications will improve  Medication Management: RN will administer medications as ordered by provider, will assess and evaluate patient's response and provide education to patient for prescribed medication. RN will report any adverse and/or side effects to prescribing provider.  Therapeutic Interventions: 1 on 1 counseling sessions, Psychoeducation, Medication administration, Evaluate responses to treatment, Monitor vital signs and CBGs as ordered, Perform/monitor CIWA, COWS, AIMS and Fall Risk screenings as ordered, Perform wound care treatments as ordered.  Evaluation of Outcomes: Progressing   LCSW Treatment Plan for Primary Diagnosis: Delusional disorder [facility]) Long Term Goal(s): Safe transition to appropriate next level of care at discharge, Engage patient in therapeutic group addressing interpersonal concerns.  Short Term Goals: Engage patient in aftercare planning with referrals and resources, Increase social support, Increase ability to appropriately verbalize feelings, Increase emotional regulation, Facilitate acceptance of mental health diagnosis and concerns, Facilitate patient progression through stages of change regarding substance use diagnoses and concerns, Identify triggers associated with mental health/substance abuse issues, and Increase skills for wellness and recovery  Therapeutic Interventions: Assess for all discharge needs, 1 to 1 time with Social worker, Explore available resources and support systems, Assess for adequacy in community support network, Educate family and significant other(s) on suicide prevention, Complete Psychosocial Assessment, Interpersonal group therapy.  Evaluation of Outcomes: Progressing   Progress in Treatment: Attending groups: No. Participating in groups: No. Taking medication as prescribed: Yes. Toleration medication:  Yes. Family/Significant other contact made: Yes, individual(s) contacted:   [name], borther, [phone] Patient understands diagnosis: No. Discussing patient identified problems/goals with staff: Yes. Medical problems stabilized or resolved: Yes. Denies suicidal/homicidal ideation: Yes. Issues/concerns per patient self-inventory: No. Other: None   New problem(s) identified: No, Describe:  05/14/24 Update: None  05/24/24 Update: No changes at this time.  Update 05/29/2024:  No changes at this time.  Update 06/03/2024:  No changes at this time. Update 06/08/24: No changes at this time Update 06/13/24: No changes at this time  Update 06/18/24: No changes at this time Update 06/23/24: No changes at this time  Update 06/28/24: No changes at this time Update 07/03/24: No changes at this time Update 07/09/24: No changes at this time Update 07/14/24: No changes at this time  Update 07/19/24: No changes at this time             New Short Term/Long Term Goal(s): elimination of symptoms of psychosis, medication management for mood stabilization; elimination of SI thoughts; development of comprehensive mental wellness plan. Update 04/02/24: No changes at this time. Update 04/07/24: No changes at this time. Update 04/12/24: No changes at this time Update 04/18/24: No changes at this time  Update 04/23/24: No changes at this time. Update 04/28/2024: No changes at this time.  Update 05/04/2024: No changes at this time.  Update 05/09/2024:  No changes at this time. 05/14/24 Update: No changes at this time. 05/19/24 Update: No changes at this time. 05/24/24 Update: No changes at this time. Update 05/29/2024:  No changes at this time. Update 06/03/2024:  No changes at this time. Update 06/08/24: No changes at this time Update 06/13/24: No changes at this time Update 06/18/24: No changes at this time Update 06/23/24: No changes at this time Update 06/28/24: No  changes at this time  Update 07/03/24: No changes at this  time Update 07/09/24: No changes at this time Update 07/14/24: No changes at this time Update 07/19/24: No changes at this time     Patient Goals:  Get out of here, I want to get healed up so I can get out of here Update 04/02/24: No changes at this time. Update 04/07/24: No changes at this time .Update 04/12/24: No changes at this time Update 04/18/24: No changes at this time  Update 04/23/24: No changes at this time. Update 04/28/2024: No changes at this time. Update 05/04/2024: No changes at this time.   Update 05/29/2024:  No changes at this time.  Update 06/03/2024:  No changes at this time. Update 06/08/24: No changes at this time Update 06/13/24: No changes at this time Update 06/18/24: No changes at this time Update 06/23/24: No changes at this time Update 06/28/24: No changes at this time  Update 07/03/24: No changes at this time Update 07/09/24: No changes at this time  Update 07/14/24: No changes at this time Update 07/19/24: No changes at this time     Discharge Plan or Barriers: CSW will assist with appropriate discharge planning  Update 04/02/24: No changes at this time. Update 04/07/24: No changes at this time.Update 04/12/24: No changes at this time Update 04/18/24: CSW submitted report to APS. Care team looking at guardianship for pt at this time  Update 04/23/24: No changes at this time. Update 04/28/2024: No changes at this time. Update 05/04/2024: No changes at this time.  Update 05/09/2024:  [facility] APS continues to search for placement. 05/14/24 Update: [facility] APS continues to look for placement. CSW to continue to assess. 05/19/24 Update: No changes at this time. 05/24/24 Update:[facility] continues to look for placement at this time Update 05/29/2024:  Patient remains on the unit and safe at this time.  Case was accepted by APS [facility] and they are looking for placement for the patient.   Update 06/03/2024:  No changes at this time. Update 06/08/24: No changes at this time Update  06/13/24: [facility] APS having difficulty connecting with pt's care coordinator at [facility] to assist with placement Update 06/18/24: No changes at this time Update 06/23/24: Arch reports that she may have found placement for pt. CSW will send TB tests per her request Update 06/28/24: Arch reports pt currently awaiting approval of 1915i. Reports she should know more about placement next week.  Update 07/03/24: No changes at this time Update 07/09/24: Pt still pending placement according to APS. Pt's 1915i is also still pending according to APS. Update 07/14/24: No changes at this time Update 07/19/24: No changes at this time     Reason for Continuation of Hospitalization: Delusions  Medication stabilization   Estimated Length of Stay: 1 to 7 days Update 04/02/24: TBD. Update 04/07/24: TBD Update 04/12/24:TBD. Update 04/28/24:TBD Update 05/04/2024: TBD Update 05/09/2024:  TBD  05/14/24 Update: TBD. 05/19/24 Update: TBD. 05/24/24 Update: TBD  Update 05/29/2024:  TBD.  Update 06/03/2024:  TBD Update 06/08/24: TBD Update 06/13/24: TBD Update 06/18/24: TBD Update 06/23/24: TBD Update 06/28/24: TBD Update 07/03/24: TBD Update 07/09/24: TBD  Update 07/14/24: TBD Update 07/19/24: TBD  Last 3 [place] Suicide Severity Risk Score: Flowsheet Row Admission (Current) from 03/27/2024 in [facility] ED from 03/26/2024 in [facility] Emergency Department at [facility] ED from 03/25/2024 in [facility]  C-SSRS RISK CATEGORY No Risk No Risk  No Risk    Last PHQ 2/9 Scores:     No data to display          Scribe for Treatment Team: [name], [place] 07/19/2024 11:16 AM

## 2024-07-19 NOTE — Progress Notes (Addendum)
 I attempted to visit with Mr Ra and invited him to group. As he has continued to do in past weeks, he declined to join group and stayed isolated to room. Will continue to try to engage and offer support.  Tansy Lorek L. [name] HERO.Div
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
## 2024-07-19 NOTE — Progress Notes (Signed)
   07/19/24 0800  Psych Admission Type (Psych Patients Only)  Admission Status Involuntary  Psychosocial Assessment  Patient Complaints Disorientation  Eye Contact Fair  Facial Expression Flat  Affect Appropriate to circumstance  Speech Logical/coherent  Interaction Minimal  Motor Activity Slow  Appearance/Hygiene In scrubs  Behavior Characteristics Appropriate to situation  Mood Despair  Thought Process  Coherency WDL  Content WDL  Delusions None reported or observed  Perception WDL  Hallucination None reported or observed  Judgment WDL  Confusion Mild  Danger to Self  Current suicidal ideation? Denies  Agreement Not to Harm Self Yes  Description of Agreement verbal  Danger to Others  Danger to Others None reported or observed

## 2024-07-19 NOTE — Group Note (Signed)
 Date:  07/19/2024 Time:  8:47 PM  Group Topic/Focus:  Making Healthy Choices:   The focus of this group is to help patients identify negative/unhealthy choices they were using prior to admission and identify positive/healthier coping strategies to replace them upon discharge.    Participation Level:  Active  Participation Quality:  Appropriate  Affect:  Appropriate  Cognitive:  Appropriate  Insight: Good  Engagement in Group:  Engaged  Modes of Intervention:  Discussion  Additional Comments:    Benjamin [name] 07/19/2024, 8:47 PM

## 2024-07-19 NOTE — Group Note (Signed)
 Date:  07/19/2024 Time:  11:25 AM  Group Topic/Focus:  Wellness Toolbox:   The focus of this group is to discuss various aspects of wellness, balancing those aspects and exploring ways to increase the ability to experience wellness.  Patients will create a wellness toolbox for use upon discharge.    Participation Level:  Minimal  Participation Quality:  Drowsy  Affect:  Lethargic  Cognitive:  Lacking  Insight: None  Engagement in Group:  None  Modes of Intervention:  Activity and Discussion  Additional Comments:     Maglione,Jerusalem Brownstein E 07/19/2024, 11:25 AM

## 2024-07-19 NOTE — Plan of Care (Signed)
   Problem: Education: Goal: Knowledge of General Education information will improve Description: Including pain rating scale, medication(s)/side effects and non-pharmacologic comfort measures Outcome: Progressing   Problem: Nutrition: Goal: Adequate nutrition will be maintained Outcome: Progressing   Problem: Coping: Goal: Level of anxiety will decrease Outcome: Progressing

## 2024-07-20 DIAGNOSIS — F22 Delusional disorders: Secondary | ICD-10-CM | POA: Diagnosis not present

## 2024-07-20 DIAGNOSIS — F0390 Unspecified dementia without behavioral disturbance: Secondary | ICD-10-CM | POA: Diagnosis not present

## 2024-07-20 DIAGNOSIS — S01112A Laceration without foreign body of left eyelid and periocular area, initial encounter: Secondary | ICD-10-CM | POA: Diagnosis not present

## 2024-07-20 LAB — URINALYSIS, COMPLETE (UACMP) WITH MICROSCOPIC
Bacteria, UA: NONE SEEN
Bilirubin Urine: NEGATIVE
Glucose, UA: NEGATIVE mg/dL
Hgb urine dipstick: NEGATIVE
Ketones, ur: NEGATIVE mg/dL
Leukocytes,Ua: NEGATIVE
Nitrite: NEGATIVE
Protein, ur: NEGATIVE mg/dL
Specific Gravity, Urine: 1.026 (ref 1.005–1.030)
pH: 6 (ref 5.0–8.0)

## 2024-07-20 LAB — CK: Total CK: 291 U/L (ref 49–397)

## 2024-07-20 LAB — COMPREHENSIVE METABOLIC PANEL WITH GFR
ALT: 27 U/L (ref 0–44)
AST: 27 U/L (ref 15–41)
Albumin: 4 g/dL (ref 3.5–5.0)
Alkaline Phosphatase: 58 U/L (ref 38–126)
Anion gap: 8 (ref 5–15)
BUN: 20 mg/dL (ref 8–23)
CO2: 28 mmol/L (ref 22–32)
Calcium: 9.3 mg/dL (ref 8.9–10.3)
Chloride: 101 mmol/L (ref 98–111)
Creatinine, Ser: 0.95 mg/dL (ref 0.61–1.24)
GFR, Estimated: >60 mL/min (ref >60)
Glucose, Bld: 99 mg/dL (ref 70–99)
Potassium: 4.4 mmol/L (ref 3.5–5.1)
Sodium: 137 mmol/L (ref 135–145)
Total Bilirubin: 0.3 mg/dL (ref 0.0–1.2)
Total Protein: 7.6 g/dL (ref 6.5–8.1)

## 2024-07-20 NOTE — Group Note (Deleted)
 Recreation Therapy Group Note   Group Topic:Leisure Education  Group Date: 07/20/2024 Start Time: 1120 End Time: 1200 Facilitators: Celestia Jeoffrey BRAVO, LRT Location: Craft Room  Group Description: Leisure. Patients were given the option to choose from journaling, coloring, drawing, making origami, playing with playdoh, listening to music or singing karaoke. LRT and pts discussed the meaning of leisure, the importance of participating in leisure during their free time/when they're outside of the hospital, as well as how our leisure interests can also serve as coping skills.   Goal Area(s) Addressed:  Patient will identify a current leisure interest.  Patient will learn the definition of "leisure". Patient will practice making a positive decision. Patient will have the opportunity to try a new leisure activity. Patient will communicate with peers and LRT.      Affect/Mood: {RT BHH Affect/Mood:26271}   Participation Level: {RT BHH Participation Level:26267}   Participation Quality: {RT BHH Participation Quality:26268}   Behavior: {RT BHH Group Behavior:26269}   Speech/Thought Process: {RT BHH Speech/Thought:26276}   Insight: {RT BHH Insight:26272}   Judgement: {RT BHH Judgement:26278}   Modes of Intervention: {RT BHH Modes of Intervention:26277}   Patient Response to Interventions:  {RT BHH Patient Response to Intervention:26274}   Education Outcome:  {RT BHH Education Outcome:26279}   Clinical Observations/Individualized Feedback: *** was *** in their participation of session activities and group discussion. Pt identified ***   Plan: {RT BHH Tx Eojw:73719}   Jeoffrey BRAVO Celestia, LRT,  07/20/2024 1:30 PM

## 2024-07-20 NOTE — Plan of Care (Signed)

## 2024-07-20 NOTE — Group Note (Signed)
 Date:  07/20/2024 Time:  9:40 PM  Group Topic/Focus:  Coping With Mental Health Crisis:   The purpose of this group is to help patients identify strategies for coping with mental health crisis.  Group discusses possible causes of crisis and ways to manage them effectively.    Participation Level:  Minimal  Participation Quality:  Appropriate  Affect:  Appropriate  Cognitive:  Appropriate  Insight: Appropriate  Engagement in Group:  Engaged  Modes of Intervention:  Discussion  Additional Comments:    Leigh VEAR Pais 07/20/2024, 9:40 PM

## 2024-07-20 NOTE — Group Note (Deleted)
 Date:  07/20/2024 Time:  10:38 AM  Group Topic/Focus:  Dimensions of Wellness:   The focus of this group is to introduce the topic of wellness and discuss the role each dimension of wellness plays in total health. Self Care:   The focus of this group is to help patients understand the importance of self-care in order to improve or restore emotional, physical, spiritual, interpersonal, and financial health.    Participation Level:  {BHH PARTICIPATION OZCZO:77735}  Participation Quality:  {BHH PARTICIPATION QUALITY:22265}  Affect:  {BHH AFFECT:22266}  Cognitive:  {BHH COGNITIVE:22267}  Insight: {BHH Insight2:20797}  Engagement in Group:  {BHH ENGAGEMENT IN HMNLE:77731}  Modes of Intervention:  {BHH MODES OF INTERVENTION:22269}  Additional Comments:  ***  Benjamin [name] Ryder 07/20/2024, 10:38 AM

## 2024-07-20 NOTE — Progress Notes (Signed)
 SI/HI: denies  Behavior/Mood: Disorganized and confused. Flat affect. Denies anxiety and depression.  Intervention/Group:Min interaction with peers and staff. Attends group.  Medications/PRNs: po med complaint. No PRNs given.  Pain: denies  Other: slept 10.5 hours   07/19/24 2100  Psych Admission Type (Psych Patients Only)  Admission Status Involuntary  Psychosocial Assessment  Patient Complaints Confusion;Disorientation  Eye Contact Fair  Facial Expression Flat  Affect Flat  Speech Slow  Interaction Minimal  Motor Activity Slow  Appearance/Hygiene In scrubs  Behavior Characteristics Appropriate to situation  Mood Despair  Thought Process  Coherency WDL  Content WDL  Delusions None reported or observed  Perception WDL  Hallucination None reported or observed  Judgment Impaired  Confusion Mild  Danger to Self  Current suicidal ideation? Denies

## 2024-07-20 NOTE — Progress Notes (Signed)
 Behavior: Flat during interaction. Patient odorous   Psych assessment:  Denies SI/HI/AVH.  Interaction / Group attendance:   Patient present during several group activities and meals.  Medication/ PRNs:  Scheduled medications given. No PRNs this shift.   Pain:  No complaints of pain this shift  15 min checks in place for safety.   Ongoing monitoring continues

## 2024-07-20 NOTE — Progress Notes (Signed)
 Pt  received in bed. Alert and oriented with mild confusion. Pt is disheveled an unkempt. Flat affect. Pt refused shower. Attends group. Limited inaction with peers and staff. Po med compliant. Urine specimen obtained and sent to lab. Denies SI/HI/AVH. No behavior issues noted.  Q 15 min checks maintained for safety. No c/o pain/discomfort noted.
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
## 2024-07-20 NOTE — Progress Notes (Signed)
 Pt is disorganized and confused upon assessment. During conversation, pt states he fell out of bed last night. Pt was later attempting  so sleep on the floor with a pillow and blankets. Pt was redirected to the bed with cues and assistance. Bed alarm applied for safety. No apparent injuries noted at this time. No c/o pain/discomfort noted.

## 2024-07-20 NOTE — Group Note (Signed)
 Date:  07/20/2024 Time:  10:40 AM  Group Topic/Focus:  Dimensions of Wellness:   The focus of this group is to introduce the topic of wellness and discuss the role each dimension of wellness plays in total health. Self Care:   The focus of this group is to help patients understand the importance of self-care in order to improve or restore emotional, physical, spiritual, interpersonal, and financial health.    Participation Level:  Active  Participation Quality:  Appropriate  Affect:  Appropriate  Cognitive:  Appropriate  Insight: Appropriate and Good  Engagement in Group:  Engaged and Lacking  Modes of Intervention:  Activity  Additional Comments:  Pt did attend afternoon group today. Although participation was minimal, pt was attentive to the activity and supportive of those who did participate. Will continue to motivate pt to attend group sessions but, actively participate as well.   Brad GORMAN Ryder 07/20/2024, 10:40 AM

## 2024-07-20 NOTE — Plan of Care (Signed)
   Problem: Nutrition: Goal: Adequate nutrition will be maintained Outcome: Progressing   Problem: Coping: Goal: Level of anxiety will decrease Outcome: Progressing

## 2024-07-20 NOTE — Group Note (Signed)
 Recreation Therapy Group Note   Group Topic:Leisure Education  Group Date: 07/20/2024 Start Time: 1215 End Time: 1300 Facilitators: Celestia Jeoffrey BRAVO, LRT, CTRS Location: Dayroom  Group Description: Bingo. Patients played multiple rounds of bingo. LRT and pts discussed the definition of leisure, things they do in their free time outside of the hospital, and how bingo is also a leisure activity. Pts received a journal or coloring book as their bingo prize.   Goal Area(s) Addressed:  Patient will identify a current leisure interest.  Patient will learn the definition of "leisure". Patient will have the opportunity to try a new leisure activity. Patient will communicate with peers and LRT.   Affect/Mood: Flat   Participation Level: Non-verbal    Clinical Observations/Individualized Feedback: Benjamin [name] was not active in their participation of session activities and group discussion. Pt was noted to have bizarre behavior such as flipping over the bingo cards multiple times and looking at it as if he was confused. Pt then once slowly lifted the card and began to stare at it while holding it up in the air. Pt did not communicate with LRT or peers while in group.    Plan: Continue to engage patient in RT group sessions 2-3x/week.   Jeoffrey BRAVO Celestia, LRT, CTRS 07/20/2024 2:00 PM

## 2024-07-21 DIAGNOSIS — S01112A Laceration without foreign body of left eyelid and periocular area, initial encounter: Secondary | ICD-10-CM | POA: Diagnosis not present

## 2024-07-21 DIAGNOSIS — F22 Delusional disorders: Secondary | ICD-10-CM | POA: Diagnosis not present

## 2024-07-21 DIAGNOSIS — F0390 Unspecified dementia without behavioral disturbance: Secondary | ICD-10-CM | POA: Diagnosis not present

## 2024-07-21 NOTE — Progress Notes (Signed)
   07/21/24 1100  Psych Admission Type (Psych Patients Only)  Admission Status Involuntary  Psychosocial Assessment  Patient Complaints Confusion  Eye Contact Fair  Facial Expression Flat  Affect Flat  Speech Slow  Interaction Minimal  Motor Activity Slow  Appearance/Hygiene In scrubs  Behavior Characteristics Appropriate to situation  Mood Ambivalent  Thought Process  Coherency WDL  Content WDL  Delusions None reported or observed  Perception WDL  Hallucination None reported or observed  Judgment Impaired  Confusion Mild  Danger to Self  Current suicidal ideation? Denies  Agreement Not to Harm Self Yes  Description of Agreement verbal  Danger to Others  Danger to Others None reported or observed

## 2024-07-21 NOTE — Group Note (Signed)
 Date:  07/21/2024 Time:  11:54 AM  Group Topic/Focus:  Coping With Mental Health Crisis:   The purpose of this group is to help patients identify strategies for coping with mental health crisis.  Group discusses possible causes of crisis and ways to manage them effectively. Conflict Resolution:   The focus of this group is to discuss the conflict resolution process and how it may be used upon discharge. Healthy Communication:   The focus of this group is to discuss communication, barriers to communication, as well as healthy ways to communicate with others.    Participation Level:  Minimal  Participation Quality:  Appropriate  Affect:  Appropriate  Cognitive:  Confused  Insight: Appropriate  Engagement in Group:  Limited and Poor  Modes of Intervention:  Activity and Discussion  Additional Comments:    Treson Laura L Darel Ricketts 07/21/2024, 11:54 AM
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
## 2024-07-21 NOTE — Plan of Care (Signed)
   Problem: Activity: Goal: Risk for activity intolerance will decrease Outcome: Progressing   Problem: Nutrition: Goal: Adequate nutrition will be maintained Outcome: Progressing   Problem: Coping: Goal: Level of anxiety will decrease Outcome: Progressing

## 2024-07-21 NOTE — Progress Notes (Signed)
   07/20/24 2100  Psych Admission Type (Psych Patients Only)  Admission Status Involuntary  Psychosocial Assessment  Patient Complaints Confusion  Eye Contact Fair  Facial Expression Flat  Affect Flat  Speech Slow  Interaction Minimal  Motor Activity Slow  Appearance/Hygiene In scrubs  Behavior Characteristics Appropriate to situation  Mood Ambivalent  Thought Process  Coherency WDL  Content WDL  Delusions None reported or observed  Perception WDL  Hallucination None reported or observed  Judgment Impaired  Confusion Mild  Danger to Self  Current suicidal ideation? Denies

## 2024-07-22 NOTE — Plan of Care (Signed)
  Problem: Activity: Goal: Risk for activity intolerance will decrease Outcome: Progressing   Problem: Nutrition: Goal: Adequate nutrition will be maintained Outcome: Progressing   Problem: Coping: Goal: Level of anxiety will decrease Outcome: Progressing   Problem: Elimination: Goal: Will not experience complications related to bowel motility Outcome: Progressing Goal: Will not experience complications related to urinary retention Outcome: Progressing   Problem: Safety: Goal: Ability to remain free from injury will improve Outcome: Progressing

## 2024-07-22 NOTE — Plan of Care (Signed)
   Problem: Coping: Goal: Level of anxiety will decrease Outcome: Progressing

## 2024-07-22 NOTE — Progress Notes (Signed)
 SI/HI: Denies  Behavior/Mood: Pt received sitting on the side on the bed in the dark. Flat affect. Pt appears withdrawn to self, min response noted and internally preoccupied. Discussed if have any concerns and encouraged to socialize in milieu and attend group. Pt said turn out the light. Later, pt found fidgeting with bed linens and pillow in the dark. Pt noted to be incont of urine. ADLs provided and personal belongings were cleaned and laundered. No voiced concerns noted. No behavior issues noted. No c/o pain/discomfort noted.   Interaction/Group: Min interaction with peers and staff. Partially attend group  Medication/PRNs: po med compliant. PRN given for agitation.  Pain: denies  Other:b/l feet swelling. Non-pitting. Legs elevated for comfort. Slept 8 hours.    07/21/24 2100  Psych Admission Type (Psych Patients Only)  Admission Status Involuntary  Psychosocial Assessment  Patient Complaints Confusion  Eye Contact Fair  Facial Expression Flat  Affect Flat  Speech Slow  Interaction Minimal  Motor Activity Slow  Appearance/Hygiene In scrubs  Behavior Characteristics Fidgety  Mood Preoccupied (internally)  Thought Process  Coherency WDL  Content Preoccupation  Delusions None reported or observed  Perception WDL  Hallucination None reported or observed  Judgment Impaired  Confusion Mild  Danger to Self  Current suicidal ideation? Denies

## 2024-07-22 NOTE — Group Note (Signed)
 Date:  07/22/2024 Time:  9:22 PM  Group Topic/Focus:  Wrap-Up Group:   The focus of this group is to help patients review their daily goal of treatment and discuss progress on daily workbooks.    Participation Level:  Active  Participation Quality:  Appropriate  Affect:  Appropriate  Cognitive:  Alert  Insight: Appropriate  Engagement in Group:  Engaged  Modes of Intervention:  Discussion  Additional Comments:    Benjamin [name] 07/22/2024, 9:22 PM

## 2024-07-22 NOTE — Group Note (Signed)
 Date:  07/22/2024 Time:  10:58 AM  Group Topic/Focus:  Goals/Boundaries Group:   The focus of this group is to help patients establish daily goals to achieve during treatment and discuss how the patient can incorporate goal setting into their daily lives to aide in recovery. Patients also learned the different boundaries types and discussed which categories that applied to them.     Participation Level:  Did Not Attend  Benjamin [name] 07/22/2024, 10:58 AM

## 2024-07-22 NOTE — Group Note (Signed)
 LCSW Group Therapy Note  Group Date: 07/21/2024 Start Time: 1330 End Time: 1400   Type of Therapy and Topic:  Group Therapy - How To Cope with Nervousness about Discharge   Participation Level:  Did Not Attend   Description of Group This process group involved identification of patients' feelings about discharge. Some of them are scheduled to be discharged soon, while others are new admissions, but each of them was asked to share thoughts and feelings surrounding discharge from the hospital. One common theme was that they are excited at the prospect of going home, while another was that many of them are apprehensive about sharing why they were hospitalized. Patients were given the opportunity to discuss these feelings with their peers in preparation for discharge.  Therapeutic Goals  Patient will identify their overall feelings about pending discharge. Patient will think about how they might proactively address issues that they believe will once again arise once they get home (i.e. with parents). Patients will participate in discussion about having hope for change.   Summary of Patient Progress: X   Therapeutic Modalities Cognitive Behavioral Therapy   [name], LCSWA 07/22/2024  6:40 AM

## 2024-07-23 DIAGNOSIS — F22 Delusional disorders: Secondary | ICD-10-CM | POA: Diagnosis not present

## 2024-07-23 DIAGNOSIS — S01112A Laceration without foreign body of left eyelid and periocular area, initial encounter: Secondary | ICD-10-CM | POA: Diagnosis not present

## 2024-07-23 DIAGNOSIS — F0390 Unspecified dementia without behavioral disturbance: Secondary | ICD-10-CM | POA: Diagnosis not present

## 2024-07-23 NOTE — Group Note (Signed)
 Recreation Therapy Group Note   Group Topic:Other  Group Date: 07/23/2024 Start Time: 1400 End Time: 1445 Facilitators: Celestia Jeoffrey BRAVO, LRT, CTRS Location: Dayroom  Activity Description/Intervention: Therapeutic Drumming. Patients with peers and staff were given the opportunity to engage in a leader facilitated [employer] Group Empowerment Drumming Circle with staff from the [employer], in partnership with [employer]. Teaching laboratory technician and trained [employer], Norleen Mon leading with LRT observing and documenting intervention and pt response. This evidenced-based practice targets 7 areas of health and wellbeing in the human experience including: stress-reduction, exercise, self-expression, camaraderie/support, nurturing, spirituality, and music-making (leisure).    Goal Area(s) Addresses:  Patient will engage in pro-social way in music group.  Patient will follow directions of drum leader on the first prompt. Patient will demonstrate no behavioral issues during group.  Patient will identify if a reduction in stress level occurs as a result of participation in therapeutic drum circle.   Affect/Mood: N/A   Participation Level: Did not attend    Clinical Observations/Individualized Feedback: Patient did not attend group.   Plan: Continue to engage patient in RT group sessions 2-3x/week.   Jeoffrey BRAVO Celestia, LRT, CTRS 07/23/2024 4:56 PM

## 2024-07-23 NOTE — Progress Notes (Signed)
 Mobility Specialist Progress Note:    07/23/24 1402  Mobility  Activity Ambulated with assistance  Level of Assistance Contact guard assist, steadying assist  Assistive Device None  Distance Ambulated (ft) 500 ft  Range of Motion/Exercises Active;All extremities  Activity Response Tolerated well  Mobility visit 1 Mobility  Mobility Specialist Start Time (ACUTE ONLY) 1337  Mobility Specialist Stop Time (ACUTE ONLY) 1354  Mobility Specialist Time Calculation (min) (ACUTE ONLY) 17 min   Pt received in dayroom, agreeable to mobility. Required CGA to stand and ambulate with no AD. Tolerated well, asx throughout.. Left pt sitting EOB, all needs met.  Sherrilee Ditty Mobility Specialist Please contact via Special Educational Needs Teacher or  Rehab office at [phone]

## 2024-07-23 NOTE — Group Note (Signed)
 Date:  07/23/2024 Time:  4:32 PM  Group Topic/Focus:  Coping With Mental Health Crisis:   The purpose of this group is to help patients identify strategies for coping with mental health crisis.  Group discusses possible causes of crisis and ways to manage them effectively.    Participation Level:  Active  Participation Quality:  Appropriate  Affect:  Appropriate  Cognitive:  Appropriate  Insight: Appropriate  Engagement in Group:  Engaged  Modes of Intervention:  Activity  Additional Comments:    Benjamin [name] 07/23/2024, 4:32 PM

## 2024-07-23 NOTE — Progress Notes (Signed)
 Behavior: Patient quietly present. Responsive to questioning but did not initiate conversation with this nurse   Psych assessment:  Denies SI/HI/AVH. Forgetful of conversation within several minutes. Confused with attempts to give directions  Interaction / Group attendance:   Patient attended several groups today. Present in dayroom for meals  Medication/ PRNs:  No PRNs this shift  Pain:  No reports of pain this shift  15 min checks in place for safety.   Ongoing monitoring continues

## 2024-07-23 NOTE — Plan of Care (Signed)

## 2024-07-23 NOTE — Progress Notes (Signed)
 [facility] MD Progress Note  07/23/2024 10:12 PM Benjamin [name]  MRN:  [id number] Benjamin [name] is a 78 year old male who presents to the inpatient geriatric psych unit after jumping from a two story building. Patient was originally seen at [facility] who referred him to the ED who admitted him here on 03/27/2024. Patient reports that people from the [place] gang were trying to kill him and the only escape was through the bedroom window on the second floor. He currently lives in the house with [name] and his family and has been living with them for 3-4 years without any problems. On 03/19/2024 he went to the doctor and when he got back home the family wouldn't let him leave. He states that after he realized the gang was going to kill him he barricaded the door with his bed and jumped out the window. When he landed he ran away until one of his neighbors found him and called 911. Patient is admitted to [facility] unit with Q15 min safety monitoring. Multidisciplinary team approach is offered. Medication management; group/milieu therapy is offered.     Subjective: Chart is reviewed and discussed with the treatment team.  APS is working on legal guardianship and placement.  He has guardianship hearing June 19, 2024 Patient is noted to be standing in his room.  He offers no complaints.  He reports fair appetite and sleep.  He is encouraged to work with physical therapy.  There is a walker in his room and he is encouraged to use it.  He denies any dizziness or lightheadedness.  He denies SI/HI/plan and denies hallucinations. Psychiatric History: see h&P  Family History:  Family History  Problem Relation Age of Onset   Colon cancer Neg Hx    Colon polyps Neg Hx    Stomach cancer Neg Hx    Esophageal cancer Neg Hx    Social History:  Social History   Substance and Sexual Activity  Alcohol Use No     Social History   Substance and Sexual Activity  Drug Use No    Social History    Socioeconomic History   Marital status: Single    Spouse name: Not on file   Number of children: Not on file   Years of education: Not on file   Highest education level: Not on file  Occupational History   Not on file  Tobacco Use   Smoking status: Former    Types: Cigarettes   Smokeless tobacco: Never  Vaping Use   Vaping status: Never Used  Substance and Sexual Activity   Alcohol use: No   Drug use: No   Sexual activity: Not on file  Other Topics Concern   Not on file  Social History Narrative   Not on file   Social Drivers of Health   Financial Resource Strain: Not on file  Food Insecurity: No Food Insecurity (03/27/2024)   Hunger Vital Sign    Worried About Running Out of Food in the Last Year: Never true    Ran Out of Food in the Last Year: Never true  Transportation Needs: No Transportation Needs (03/27/2024)   PRAPARE - Administrator, Civil Service (Medical): No    Lack of Transportation (Non-Medical): No  Physical Activity: Not on file  Stress: Not on file  Social Connections: Moderately Integrated (03/27/2024)   Social Connection and Isolation Panel    Frequency of Communication with Friends and Family: Twice a week    Frequency  of Social Gatherings with Friends and Family: Once a week    Attends Religious Services: 1 to 4 times per year    Active Member of [employer] or Organizations: No    Attends Engineer, Structural: 1 to 4 times per year    Marital Status: Divorced   Past Medical History:  Past Medical History:  Diagnosis Date   Allergy    Arthritis    back   Prostate cancer (HCC) dx'd 2010   surg only    Past Surgical History:  Procedure Laterality Date   COLONOSCOPY     EYE SURGERY     PROSTATE SURGERY      Current Medications: Current Facility-Administered Medications  Medication Dose Route Frequency Provider Last Rate Last Admin   acetaminophen  (TYLENOL ) tablet 650 mg  650 mg Oral Q6H PRN Coleman, Carolyn H, NP        alum & mag hydroxide-simeth (MAALOX/MYLANTA) 200-200-20 MG/5ML suspension 30 mL  30 mL Oral Q4H PRN Coleman, Carolyn H, NP       docusate sodium  (COLACE) capsule 100 mg  100 mg Oral Daily Skyy Nilan, MD   100 mg at 07/23/24 9060   fluPHENAZine  (PROLIXIN ) tablet 10 mg  10 mg Oral BID Shrivastava, Aryendra, MD   10 mg at 07/23/24 2041   magnesium  hydroxide (MILK OF MAGNESIA) suspension 30 mL  30 mL Oral Daily PRN Coleman, Carolyn H, NP       OLANZapine  (ZYPREXA ) injection 5 mg  5 mg Intramuscular TID PRN Mardy Elveria DEL, NP   5 mg at 04/12/24 2230   OLANZapine  zydis (ZYPREXA ) disintegrating tablet 5 mg  5 mg Oral TID PRN Coleman, Carolyn H, NP   5 mg at 07/22/24 0034   polyethylene glycol (MIRALAX  / GLYCOLAX ) packet 17 g  17 g Oral Daily PRN Donnelly Mellow, MD   17 g at 06/04/24 2123    Lab Results:  No results found for this or any previous visit (from the past 48 hours).      Blood Alcohol level:  Lab Results  Component Value Date   [facility] <15 03/26/2024    Metabolic Disorder Labs: Lab Results  Component Value Date   HGBA1C 5.6 04/02/2024   MPG 114 04/02/2024   No results found for: PROLACTIN Lab Results  Component Value Date   CHOL 146 04/02/2024   TRIG 37 04/02/2024   HDL 43 04/02/2024   CHOLHDL 3.4 04/02/2024   VLDL 7 04/02/2024   LDLCALC 96 04/02/2024    Physical Findings: AIMS:  , ,  ,  ,    CIWA:    COWS:      Psychiatric Specialty Exam:  Presentation  General Appearance:  Appropriate for Environment; Bizarre  Eye Contact: Fair  Speech: Normal rate  Speech Volume: Normal    Mood and Affect  Mood: Fine Affect: Congruent   Thought Process  Thought Processes: Disorganized At baseline Descriptions of Associations:Intact  Orientation:Full (Time, Place and Person)  Thought Content: Impoverished Hallucinations: Denies  Ideas of Reference: denies Suicidal Thoughts: Denies  Homicidal Thoughts: Denies   Sensorium   Memory: impiared  Judgment: Impaired  Insight: Community Education Officer  Concentration: Fair  Attention Span: Fair  Recall: [employer] of Knowledge: Fair  Language: Fair   Psychomotor Activity  Psychomotor Activity: No data recorded  Musculoskeletal: Strength & Muscle Tone: within normal limits Gait & Station: normal Assets  Assets: Manufacturing Systems Engineer; Resilience    Physical Exam: Physical Exam Vitals  and nursing note reviewed.    ROS Blood pressure 107/71, pulse 88, temperature 98.4 F (36.9 C), resp. rate 16, height 6' (1.829 m), weight 100 kg, SpO2 99%. Body mass index is 29.91 kg/m.  Diagnosis: Principal Problem:   Delusional disorder (HCC) Active Problems:   Laceration of left eyebrow   Major neurocognitive disorder [facility])  Major Neurocognitive disorder  Clinical Decision Making: Patient currently admitted after jumping off a two-story building in the context of possible delusions as reported as the people in the house he lived for 4 years trying to kill him.  Patient needs to be monitored closely for ongoing psychosis and paranoid delusions.  Given patient's confusion and chronic paranoia, no family support, APS has taken up guardianship and looking for placement     Safety and Monitoring:             -- InVoluntary admission to inpatient psychiatric unit for safety, stabilization and treatment             -- Daily contact with patient to assess and evaluate symptoms and progress in treatment             -- Patient's case to be discussed in multi-disciplinary team meeting             -- Observation Level: q15 minute checks             -- Vital signs:  q12 hours             -- Precautions: suicide, elopement, and assault   2. Psychiatric Diagnoses and Treatment:              Continue Prolixin  to 10 mg twice daily.  -- The risks/benefits/side-effects/alternatives to this medication were discussed in detail with the patient and time  was given for questions. The patient consents to medication trial.                -- Metabolic profile and EKG monitoring obtained while on an atypical antipsychotic (BMI: Lipid Panel: HbgA1c: QTc:)              -- Encouraged patient to participate in unit milieu and in scheduled group therapies                            3. Medical Issues Being Addressed:    Fall on/8/25, CT head normal  4. Discharge Planning:   -- Social work and case management to assist with discharge planning and identification of hospital follow-up needs prior to discharge  -- Estimated LOS: 3-4 days  Allyn Foil, MD 07/23/2024, 10:12 PM

## 2024-07-23 NOTE — Plan of Care (Signed)
  Problem: Coping: Goal: Level of anxiety will decrease Outcome: Progressing   Problem: Education: Goal: Knowledge of General Education information will improve Description: Including pain rating scale, medication(s)/side effects and non-pharmacologic comfort measures Outcome: Not Progressing   Problem: Health Behavior/Discharge Planning: Goal: Ability to manage health-related needs will improve Outcome: Not Progressing

## 2024-07-23 NOTE — Group Note (Signed)
 Date:  07/23/2024 Time:  6:23 PM  Group Topic/Focus:   Christmas coloring  Coloring Relieves Stress Coloring is a stimulating activity that provides many health benefits similar to meditation. It relaxes the mind and focuses attention on the present moment, which may help get rid of negative thoughts and ease the stress of daily life Coloring improves fine motor skills and hand-eye coordination, which are crucial for maintaining dexterity in older adults. Engaging in creative activities like coloring stimulates cognitive function, aiding in memory retention and problem-solving skills.  Participation Level:  Did Not Attend   [name] Gavel 07/23/2024, 6:23 PM

## 2024-07-23 NOTE — Progress Notes (Signed)
   07/22/24 2114  Psych Admission Type (Psych Patients Only)  Admission Status Involuntary  Psychosocial Assessment  Patient Complaints Confusion  Eye Contact Fair  Facial Expression Flat  Affect Flat  Speech Slow  Interaction Minimal  Motor Activity Slow  Appearance/Hygiene Unremarkable  Behavior Characteristics Cooperative;Calm  Mood Pleasant  Thought Process  Coherency WDL  Content Preoccupation  Delusions None reported or observed  Perception WDL  Hallucination None reported or observed  Judgment Limited  Confusion Mild  Danger to Self  Current suicidal ideation? Denies  Danger to Others  Danger to Others None reported or observed

## 2024-07-23 NOTE — Group Note (Signed)
 Date:  07/23/2024 Time:  9:58 PM  Group Topic/Focus:  Wrap-Up Group:   The focus of this group is to help patients review their daily goal of treatment and discuss progress on daily workbooks.    Participation Level:  Did Not Attend  Participation Quality:     Affect:     Cognitive:     Insight: None  Engagement in Group:  None  Modes of Intervention:     Additional Comments:    Benjamin [name] Benjamin [name] 07/23/2024, 9:58 PM

## 2024-07-24 DIAGNOSIS — S01112A Laceration without foreign body of left eyelid and periocular area, initial encounter: Secondary | ICD-10-CM | POA: Diagnosis not present

## 2024-07-24 DIAGNOSIS — F22 Delusional disorders: Secondary | ICD-10-CM | POA: Diagnosis not present

## 2024-07-24 DIAGNOSIS — F0390 Unspecified dementia without behavioral disturbance: Secondary | ICD-10-CM | POA: Diagnosis not present

## 2024-07-24 NOTE — Progress Notes (Signed)
 [facility] MD Progress Note  07/24/2024 10:09 PM Benjamin [name]  MRN:  [id number] Benjamin [name] is a 78 year old male who presents to the inpatient geriatric psych unit after jumping from a two story building. Patient was originally seen at [facility] who referred him to the ED who admitted him here on 03/27/2024. Patient reports that people from the [place] gang were trying to kill him and the only escape was through the bedroom window on the second floor. He currently lives in the house with [name] and his family and has been living with them for 3-4 years without any problems. On 03/19/2024 he went to the doctor and when he got back home the family wouldn't let him leave. He states that after he realized the gang was going to kill him he barricaded the door with his bed and jumped out the window. When he landed he ran away until one of his neighbors found him and called 911. Patient is admitted to [facility] unit with Q15 min safety monitoring. Multidisciplinary team approach is offered. Medication management; group/milieu therapy is offered.     Subjective: Chart is reviewed and discussed with the treatment team.  APS is working on legal guardianship and placement.  He has guardianship hearing June 19, 2024  Patient is noted to be more verbal today.  Patient reports that he was talking to somebody yesterday about how cops put shackles on him and brought him here.  Patient is taking his medications and offers no side effects.  Patient denies SI/HI/plan and denies hallucinations.  He is able to recognize this provider as his doctor.  No behavioral problems noted. Psychiatric History: see h&P  Family History:  Family History  Problem Relation Age of Onset   Colon cancer Neg Hx    Colon polyps Neg Hx    Stomach cancer Neg Hx    Esophageal cancer Neg Hx    Social History:  Social History   Substance and Sexual Activity  Alcohol Use No     Social History   Substance and  Sexual Activity  Drug Use No    Social History   Socioeconomic History   Marital status: Single    Spouse name: Not on file   Number of children: Not on file   Years of education: Not on file   Highest education level: Not on file  Occupational History   Not on file  Tobacco Use   Smoking status: Former    Types: Cigarettes   Smokeless tobacco: Never  Vaping Use   Vaping status: Never Used  Substance and Sexual Activity   Alcohol use: No   Drug use: No   Sexual activity: Not on file  Other Topics Concern   Not on file  Social History Narrative   Not on file   Social Drivers of Health   Financial Resource Strain: Not on file  Food Insecurity: No Food Insecurity (03/27/2024)   Hunger Vital Sign    Worried About Running Out of Food in the Last Year: Never true    Ran Out of Food in the Last Year: Never true  Transportation Needs: No Transportation Needs (03/27/2024)   PRAPARE - Administrator, Civil Service (Medical): No    Lack of Transportation (Non-Medical): No  Physical Activity: Not on file  Stress: Not on file  Social Connections: Moderately Integrated (03/27/2024)   Social Connection and Isolation Panel    Frequency of Communication with Friends and Family: Twice a  week    Frequency of Social Gatherings with Friends and Family: Once a week    Attends Religious Services: 1 to 4 times per year    Active Member of [employer] or Organizations: No    Attends Engineer, Structural: 1 to 4 times per year    Marital Status: Divorced   Past Medical History:  Past Medical History:  Diagnosis Date   Allergy    Arthritis    back   Prostate cancer (HCC) dx'd 2010   surg only    Past Surgical History:  Procedure Laterality Date   COLONOSCOPY     EYE SURGERY     PROSTATE SURGERY      Current Medications: Current Facility-Administered Medications  Medication Dose Route Frequency Provider Last Rate Last Admin   acetaminophen  (TYLENOL ) tablet 650 mg  650  mg Oral Q6H PRN Coleman, Carolyn H, NP       alum & mag hydroxide-simeth (MAALOX/MYLANTA) 200-200-20 MG/5ML suspension 30 mL  30 mL Oral Q4H PRN Coleman, Carolyn H, NP       docusate sodium  (COLACE) capsule 100 mg  100 mg Oral Daily Qamar Rosman, MD   100 mg at 07/24/24 9041   fluPHENAZine  (PROLIXIN ) tablet 10 mg  10 mg Oral BID Shrivastava, Aryendra, MD   10 mg at 07/24/24 2052   magnesium  hydroxide (MILK OF MAGNESIA) suspension 30 mL  30 mL Oral Daily PRN Mardy Elveria DEL, NP       OLANZapine  (ZYPREXA ) injection 5 mg  5 mg Intramuscular TID PRN Mardy Elveria DEL, NP   5 mg at 04/12/24 2230   OLANZapine  zydis (ZYPREXA ) disintegrating tablet 5 mg  5 mg Oral TID PRN Coleman, Carolyn H, NP   5 mg at 07/22/24 0034   polyethylene glycol (MIRALAX  / GLYCOLAX ) packet 17 g  17 g Oral Daily PRN Donnelly Mellow, MD   17 g at 06/04/24 2123    Lab Results:  No results found for this or any previous visit (from the past 48 hours).      Blood Alcohol level:  Lab Results  Component Value Date   [facility] <15 03/26/2024    Metabolic Disorder Labs: Lab Results  Component Value Date   HGBA1C 5.6 04/02/2024   MPG 114 04/02/2024   No results found for: PROLACTIN Lab Results  Component Value Date   CHOL 146 04/02/2024   TRIG 37 04/02/2024   HDL 43 04/02/2024   CHOLHDL 3.4 04/02/2024   VLDL 7 04/02/2024   LDLCALC 96 04/02/2024    Physical Findings: AIMS:  , ,  ,  ,    CIWA:    COWS:      Psychiatric Specialty Exam:  Presentation  General Appearance:  Appropriate for Environment; Bizarre  Eye Contact: Fair  Speech: Normal rate  Speech Volume: Normal    Mood and Affect  Mood: Fine Affect: Congruent   Thought Process  Thought Processes: Disorganized At baseline Descriptions of Associations:Intact  Orientation:Full (Time, Place and Person)  Thought Content: Impoverished Hallucinations: Denies  Ideas of Reference: denies Suicidal Thoughts: Denies  Homicidal  Thoughts: Denies   Sensorium  Memory: impiared  Judgment: Impaired  Insight: Community Education Officer  Concentration: Fair  Attention Span: Fair  Recall: [employer] of Knowledge: Fair  Language: Fair   Psychomotor Activity  Psychomotor Activity: No data recorded  Musculoskeletal: Strength & Muscle Tone: within normal limits Gait & Station: normal Assets  Assets: Manufacturing Systems Engineer; Resilience  Physical Exam: Physical Exam Vitals and nursing note reviewed.    ROS Blood pressure 137/84, pulse 75, temperature 98.4 F (36.9 C), resp. rate 14, height 6' (1.829 m), weight 100 kg, SpO2 100%. Body mass index is 29.91 kg/m.  Diagnosis: Principal Problem:   Delusional disorder (HCC) Active Problems:   Laceration of left eyebrow   Major neurocognitive disorder [facility])  Major Neurocognitive disorder  Clinical Decision Making: Patient currently admitted after jumping off a two-story building in the context of possible delusions as reported as the people in the house he lived for 4 years trying to kill him.  Patient needs to be monitored closely for ongoing psychosis and paranoid delusions.  Given patient's confusion and chronic paranoia, no family support, APS has taken up guardianship and looking for placement     Safety and Monitoring:             -- InVoluntary admission to inpatient psychiatric unit for safety, stabilization and treatment             -- Daily contact with patient to assess and evaluate symptoms and progress in treatment             -- Patient's case to be discussed in multi-disciplinary team meeting             -- Observation Level: q15 minute checks             -- Vital signs:  q12 hours             -- Precautions: suicide, elopement, and assault   2. Psychiatric Diagnoses and Treatment:              Continue Prolixin  to 10 mg twice daily.  -- The risks/benefits/side-effects/alternatives to this medication were discussed in  detail with the patient and time was given for questions. The patient consents to medication trial.                -- Metabolic profile and EKG monitoring obtained while on an atypical antipsychotic (BMI: Lipid Panel: HbgA1c: QTc:)              -- Encouraged patient to participate in unit milieu and in scheduled group therapies                            3. Medical Issues Being Addressed:    Fall on/8/25, CT head normal  4. Discharge Planning:   -- Social work and case management to assist with discharge planning and identification of hospital follow-up needs prior to discharge  -- Estimated LOS: 3-4 days  Allyn Foil, MD 07/24/2024, 10:09 PM

## 2024-07-24 NOTE — Group Note (Signed)
 Date:  07/24/2024 Time:  6:25 PM  Group Topic/Focus:  Managing Feelings:   The focus of this group is to identify what feelings patients have difficulty handling and develop a plan to handle them in a healthier way upon discharge.    Participation Level:  Did Not Attend  Participation Quality:    Affect:    Cognitive:    Insight:   Engagement in Group:    Modes of Intervention:    Additional Comments:    Horald Birky K 07/24/2024, 6:25 PM

## 2024-07-24 NOTE — Plan of Care (Signed)
  Problem: Clinical Measurements: Goal: Will remain free from infection Outcome: Progressing   Problem: Activity: Goal: Risk for activity intolerance will decrease Outcome: Progressing   

## 2024-07-24 NOTE — Progress Notes (Signed)
   07/23/24 2200  Psych Admission Type (Psych Patients Only)  Admission Status Involuntary  Psychosocial Assessment  Patient Complaints Confusion  Eye Contact Brief  Facial Expression Flat  Affect Flat  Speech Slow  Interaction Minimal  Motor Activity Slow  Appearance/Hygiene Unremarkable  Behavior Characteristics Cooperative;Calm  Mood Pleasant  Thought Process  Coherency WDL  Content Preoccupation  Delusions None reported or observed  Perception WDL  Hallucination None reported or observed  Judgment Limited  Confusion Mild  Danger to Self  Current suicidal ideation? Denies  Self-Injurious Behavior No self-injurious ideation or behavior indicators observed or expressed   Agreement Not to Harm Self Yes  Description of Agreement Verbal  Danger to Others  Danger to Others None reported or observed

## 2024-07-24 NOTE — Group Note (Signed)
 Date:  07/24/2024 Time:  6:19 PM  Group Topic/Focus:  Coping With Mental Health Crisis:   The purpose of this group is to help patients identify strategies for coping with mental health crisis.  Group discusses possible causes of crisis and ways to manage them effectively.    Participation Level:  Did Not Attend  Participation Quality:    Affect:    Cognitive:    Insight:   Engagement in Group:    Modes of Intervention:    Additional Comments:    Anai Lipson 07/24/2024, 6:19 PM

## 2024-07-24 NOTE — Progress Notes (Signed)
 Mobility Specialist Progress Note:    07/24/24 1342  Mobility  Activity Ambulated with assistance  Level of Assistance Standby assist, set-up cues, supervision of patient - no hands on  Assistive Device Front wheel walker  Distance Ambulated (ft) 300 ft  Range of Motion/Exercises Active;All extremities  Activity Response Tolerated well  Mobility visit 1 Mobility  Mobility Specialist Start Time (ACUTE ONLY) 1238  Mobility Specialist Stop Time (ACUTE ONLY) 1246  Mobility Specialist Time Calculation (min) (ACUTE ONLY) 8 min   Pt received sitting EOB, pleasant and agreeable to mobility. Required supervision to stand and ambulate, and required encouragement to use RW. Tolerated well, asx throughout. Returned pt sitting EOB, all needs met.  Sherrilee Ditty Mobility Specialist Please contact via Special Educational Needs Teacher or  Rehab office at [phone]

## 2024-07-24 NOTE — Progress Notes (Signed)
 [facility] MD Progress Note  07/24/2024 1:27 PM Race Benjamin [name]  MRN:  [id number] Benjamin [name] is a 78 year old male who presents to the inpatient geriatric psych unit after jumping from a two story building. Patient was originally seen at [facility] who referred him to the ED who admitted him here on 03/27/2024. Patient reports that people from the [place] gang were trying to kill him and the only escape was through the bedroom window on the second floor. He currently lives in the house with [name] and his family and has been living with them for 3-4 years without any problems. On 03/19/2024 he went to the doctor and when he got back home the family wouldn't let him leave. He states that after he realized the gang was going to kill him he barricaded the door with his bed and jumped out the window. When he landed he ran away until one of his neighbors found him and called 911. Patient is admitted to [facility] unit with Q15 min safety monitoring. Multidisciplinary team approach is offered. Medication management; group/milieu therapy is offered.     Subjective: Chart is reviewed and discussed with the treatment team.  APS is working on legal guardianship and placement.  He has guardianship hearing June 19, 2024  Patient is noted to be sitting in his room.  He is more verbal today.  He talks about somebody putting shackles to him and bringing him to the hospital which occurred during his hospitalization.  Per nursing report patient is agreeable to work with the mobility specialist.  He is able to recognize this provider being his doctor.  He denies SI/HI/plan and denies hallucinations.    Psychiatric History: see h&P  Family History:  Family History  Problem Relation Age of Onset   Colon cancer Neg Hx    Colon polyps Neg Hx    Stomach cancer Neg Hx    Esophageal cancer Neg Hx    Social History:  Social History   Substance and Sexual Activity  Alcohol Use No     Social  History   Substance and Sexual Activity  Drug Use No    Social History   Socioeconomic History   Marital status: Single    Spouse name: Not on file   Number of children: Not on file   Years of education: Not on file   Highest education level: Not on file  Occupational History   Not on file  Tobacco Use   Smoking status: Former    Types: Cigarettes   Smokeless tobacco: Never  Vaping Use   Vaping status: Never Used  Substance and Sexual Activity   Alcohol use: No   Drug use: No   Sexual activity: Not on file  Other Topics Concern   Not on file  Social History Narrative   Not on file   Social Drivers of Health   Financial Resource Strain: Not on file  Food Insecurity: No Food Insecurity (03/27/2024)   Hunger Vital Sign    Worried About Running Out of Food in the Last Year: Never true    Ran Out of Food in the Last Year: Never true  Transportation Needs: No Transportation Needs (03/27/2024)   PRAPARE - Administrator, Civil Service (Medical): No    Lack of Transportation (Non-Medical): No  Physical Activity: Not on file  Stress: Not on file  Social Connections: Moderately Integrated (03/27/2024)   Social Connection and Isolation Panel    Frequency of Communication  with Friends and Family: Twice a week    Frequency of Social Gatherings with Friends and Family: Once a week    Attends Religious Services: 1 to 4 times per year    Active Member of [employer] or Organizations: No    Attends Engineer, Structural: 1 to 4 times per year    Marital Status: Divorced   Past Medical History:  Past Medical History:  Diagnosis Date   Allergy    Arthritis    back   Prostate cancer (HCC) dx'd 2010   surg only    Past Surgical History:  Procedure Laterality Date   COLONOSCOPY     EYE SURGERY     PROSTATE SURGERY      Current Medications: Current Facility-Administered Medications  Medication Dose Route Frequency Provider Last Rate Last Admin   acetaminophen   (TYLENOL ) tablet 650 mg  650 mg Oral Q6H PRN Coleman, Carolyn H, NP       alum & mag hydroxide-simeth (MAALOX/MYLANTA) 200-200-20 MG/5ML suspension 30 mL  30 mL Oral Q4H PRN Coleman, Carolyn H, NP       docusate sodium  (COLACE) capsule 100 mg  100 mg Oral Daily Velia Pamer, MD   100 mg at 07/24/24 9041   fluPHENAZine  (PROLIXIN ) tablet 10 mg  10 mg Oral BID Shrivastava, Aryendra, MD   10 mg at 07/24/24 9247   magnesium  hydroxide (MILK OF MAGNESIA) suspension 30 mL  30 mL Oral Daily PRN Mardy Elveria DEL, NP       OLANZapine  (ZYPREXA ) injection 5 mg  5 mg Intramuscular TID PRN Mardy Elveria DEL, NP   5 mg at 04/12/24 2230   OLANZapine  zydis (ZYPREXA ) disintegrating tablet 5 mg  5 mg Oral TID PRN Coleman, Carolyn H, NP   5 mg at 07/22/24 0034   polyethylene glycol (MIRALAX  / GLYCOLAX ) packet 17 g  17 g Oral Daily PRN Donnelly Mellow, MD   17 g at 06/04/24 2123    Lab Results:  No results found for this or any previous visit (from the past 48 hours).      Blood Alcohol level:  Lab Results  Component Value Date   [facility] <15 03/26/2024    Metabolic Disorder Labs: Lab Results  Component Value Date   HGBA1C 5.6 04/02/2024   MPG 114 04/02/2024   No results found for: PROLACTIN Lab Results  Component Value Date   CHOL 146 04/02/2024   TRIG 37 04/02/2024   HDL 43 04/02/2024   CHOLHDL 3.4 04/02/2024   VLDL 7 04/02/2024   LDLCALC 96 04/02/2024    Physical Findings: AIMS:  , ,  ,  ,    CIWA:    COWS:      Psychiatric Specialty Exam:  Presentation  General Appearance:  Appropriate for Environment; Bizarre  Eye Contact: Fair  Speech: Normal rate  Speech Volume: Normal    Mood and Affect  Mood: Fine Affect: Congruent   Thought Process  Thought Processes: Disorganized At baseline Descriptions of Associations:Intact  Orientation:Full (Time, Place and Person)  Thought Content: Impoverished Hallucinations: Denies  Ideas of Reference: denies Suicidal  Thoughts: Denies  Homicidal Thoughts: Denies   Sensorium  Memory: impiared  Judgment: Impaired  Insight: Community Education Officer  Concentration: Fair  Attention Span: Fair  Recall: [employer] of Knowledge: Fair  Language: Fair   Psychomotor Activity  Psychomotor Activity: No data recorded  Musculoskeletal: Strength & Muscle Tone: within normal limits Gait & Station: normal Assets  Assets:  Communication Skills; Resilience    Physical Exam: Physical Exam Vitals and nursing note reviewed.    ROS Blood pressure 92/68, pulse 72, temperature 98.1 F (36.7 C), resp. rate 18, height 6' (1.829 m), weight 100 kg, SpO2 100%. Body mass index is 29.91 kg/m.  Diagnosis: Principal Problem:   Delusional disorder (HCC) Active Problems:   Laceration of left eyebrow   Major neurocognitive disorder [name])  Major Neurocognitive disorder  Clinical Decision Making: Patient currently admitted after jumping off a two-story building in the context of possible delusions as reported as the people in the house he lived for 4 years trying to kill him.  Patient needs to be monitored closely for ongoing psychosis and paranoid delusions.  Given patient's confusion and chronic paranoia, no family support, APS has taken up guardianship and looking for placement     Safety and Monitoring:             -- InVoluntary admission to inpatient psychiatric unit for safety, stabilization and treatment             -- Daily contact with patient to assess and evaluate symptoms and progress in treatment             -- Patient's case to be discussed in multi-disciplinary team meeting             -- Observation Level: q15 minute checks             -- Vital signs:  q12 hours             -- Precautions: suicide, elopement, and assault   2. Psychiatric Diagnoses and Treatment:              Continue Prolixin  to 10 mg twice daily.  -- The risks/benefits/side-effects/alternatives to this  medication were discussed in detail with the patient and time was given for questions. The patient consents to medication trial.                -- Metabolic profile and EKG monitoring obtained while on an atypical antipsychotic (BMI: Lipid Panel: HbgA1c: QTc:)              -- Encouraged patient to participate in unit milieu and in scheduled group therapies                            3. Medical Issues Being Addressed:    Fall on/8/25, CT head normal  4. Discharge Planning:   -- Social work and case management to assist with discharge planning and identification of hospital follow-up needs prior to discharge  -- Estimated LOS: 3-4 days  Allyn Foil, MD 07/24/2024, 1:27 PM

## 2024-07-24 NOTE — Group Note (Signed)
 Q1`LCSW Group Therapy Note  Group Date: 07/24/2024 Start Time: 1315 End Time: 1345   Type of Therapy and Topic:  Group Therapy: Self-Harm Alternatives  Participation Level:  Did Not Attend   Description of Group:   Patients participated in a discussion regarding non-suicidal self-injurious behavior (NSSIB, or self-harm) and the stigma surrounding it. There was also discussion surrounding how other maladaptive coping skills could be seen as self-harm, such as substance abuse. Participants were invited to share their experiences with self-harm, with emphasis being placed on the motivation for self-harm (such as release, punishment, feeling numb, etc). Patients were then asked to brainstorm potential substitutions for self-harm and were provided with a handout entitled, Distraction Techniques and Alternative Coping Strategies, published by [employer] Self-Injury Recovery.  Therapeutic Goals:  Patients will be given the opportunity to discuss NSSIB in a non-judgmental and therapeutic environment. Patients will identify which feelings lead to NSSIB.  Patients will discuss potential healthy coping skills to replace NSSIB Open discussion will specifically address stigma and shame surrounding NSSIB.   Summary of Patient Progress: X  Therapeutic Modalities:   Cognitive Behavioral Therapy   [name], LCSWA 07/24/2024  2:21 PM

## 2024-07-24 NOTE — Plan of Care (Signed)
   Problem: Nutrition: Goal: Adequate nutrition will be maintained Outcome: Progressing   Problem: Coping: Goal: Level of anxiety will decrease Outcome: Progressing

## 2024-07-24 NOTE — Group Note (Signed)
 Date:  07/24/2024 Time:  9:25 PM  Group Topic/Focus:  Wrap-Up Group:   The focus of this group is to help patients review their daily goal of treatment and discuss progress on daily workbooks.    Participation Level:  Active  Participation Quality:  Appropriate  Affect:  Appropriate  Cognitive:  Alert  Insight: Appropriate  Engagement in Group:  Engaged  Modes of Intervention:  Discussion  Additional Comments:    Benjamin [name] 07/24/2024, 9:25 PM

## 2024-07-24 NOTE — Progress Notes (Signed)
 Behavior: Patient playful and joking during PT.    Psych assessment:  Denies SI/HI/AVH. Confused and forgetful   Interaction / Group attendance:   Present for group and meals in dayroom.   Medication/ PRNs:  Took medications without issues. No PRNs.   Pain:  Denies pain this shift  15 min checks in place for safety.   Ongoing monitoring continues

## 2024-07-24 NOTE — Group Note (Signed)
 Recreation Therapy Group Note   Group Topic:Relaxation  Group Date: 07/24/2024 Start Time: 1400 End Time: 1440 Facilitators: Celestia Jeoffrey BRAVO, LRT, CTRS Location: Dayroom  Group Description: PMR (Progressive Muscle Relaxation). LRT educates patients on what PMR is and the benefits that come from it. Patients are asked to sit with their feet flat on the floor while sitting up and all the way back in their chair, if possible. LRT and pts follow a prompt through a speaker that requires you to tense and release different muscles in their body and focus on their breathing. During session, lights are off and soft music is being played. Pts are given a stress ball to use if needed.   Goal Area(s) Addressed:  Patients will be able to describe progressive muscle relaxation.  Patient will practice using relaxation technique. Patient will identify a new coping skill.  Patient will follow multistep directions to reduce anxiety and stress.   Affect/Mood: Asleep   Participation Level: Non-verbal    Clinical Observations/Individualized Feedback: Benjamin [name] was asleep duration of session.   Plan: Continue to engage patient in RT group sessions 2-3x/week.   Jeoffrey BRAVO Celestia, LRT, CTRS 07/24/2024 5:15 PM

## 2024-07-25 NOTE — Progress Notes (Signed)
 [facility] MD Progress Note  07/25/2024 4:07 PM Benjamin [name]  MRN:  [id number] Benjamin [name] is a 78 year old male who presents to the inpatient geriatric psych unit after jumping from a two story building. Patient was originally seen at [name] who referred him to the ED who admitted him here on 03/27/2024. Patient reports that people from the [place] gang were trying to kill him and the only escape was through the bedroom window on the second floor. He currently lives in the house with [name] and his family and has been living with them for 3-4 years without any problems. On 03/19/2024 he went to the doctor and when he got back home the family wouldn't let him leave. He states that after he realized the gang was going to kill him he barricaded the door with his bed and jumped out the window. When he landed he ran away until one of his neighbors found him and called 911. Patient is admitted to [facility] unit with Q15 min safety monitoring. Multidisciplinary team approach is offered. Medication management; group/milieu therapy is offered.     Subjective: Chart is reviewed and discussed with the treatment team.  APS is working on legal guardianship and placement.  He has guardianship hearing June 19, 2024 Patient is noted to be sitting in chair in the day area.  He offers no complaints.  He was happy to see the provider.  He reports working with the mobility specialist.  He is not endorsing SI/HI/plan and not endorsing any hallucinations.  Psychiatric History: see h&P  Family History:  Family History  Problem Relation Age of Onset   Colon cancer Neg Hx    Colon polyps Neg Hx    Stomach cancer Neg Hx    Esophageal cancer Neg Hx    Social History:  Social History   Substance and Sexual Activity  Alcohol Use No     Social History   Substance and Sexual Activity  Drug Use No    Social History   Socioeconomic History   Marital status: Single    Spouse name: Not on  file   Number of children: Not on file   Years of education: Not on file   Highest education level: Not on file  Occupational History   Not on file  Tobacco Use   Smoking status: Former    Types: Cigarettes   Smokeless tobacco: Never  Vaping Use   Vaping status: Never Used  Substance and Sexual Activity   Alcohol use: No   Drug use: No   Sexual activity: Not on file  Other Topics Concern   Not on file  Social History Narrative   Not on file   Social Drivers of Health   Financial Resource Strain: Not on file  Food Insecurity: No Food Insecurity (03/27/2024)   Hunger Vital Sign    Worried About Running Out of Food in the Last Year: Never true    Ran Out of Food in the Last Year: Never true  Transportation Needs: No Transportation Needs (03/27/2024)   PRAPARE - Administrator, Civil Service (Medical): No    Lack of Transportation (Non-Medical): No  Physical Activity: Not on file  Stress: Not on file  Social Connections: Moderately Integrated (03/27/2024)   Social Connection and Isolation Panel    Frequency of Communication with Friends and Family: Twice a week    Frequency of Social Gatherings with Friends and Family: Once a week    Attends  Religious Services: 1 to 4 times per year    Active Member of Clubs or Organizations: No    Attends Banker Meetings: 1 to 4 times per year    Marital Status: Divorced   Past Medical History:  Past Medical History:  Diagnosis Date   Allergy    Arthritis    back   Prostate cancer (HCC) dx'd 2010   surg only    Past Surgical History:  Procedure Laterality Date   COLONOSCOPY     EYE SURGERY     PROSTATE SURGERY      Current Medications: Current Facility-Administered Medications  Medication Dose Route Frequency Provider Last Rate Last Admin   acetaminophen  (TYLENOL ) tablet 650 mg  650 mg Oral Q6H PRN Coleman, Carolyn H, NP       alum & mag hydroxide-simeth (MAALOX/MYLANTA) 200-200-20 MG/5ML suspension 30 mL   30 mL Oral Q4H PRN Coleman, Carolyn H, NP       docusate sodium  (COLACE) capsule 100 mg  100 mg Oral Daily Marlenne Ridge, MD   100 mg at 07/25/24 0945   fluPHENAZine  (PROLIXIN ) tablet 10 mg  10 mg Oral BID Shrivastava, Aryendra, MD   10 mg at 07/25/24 0945   magnesium  hydroxide (MILK OF MAGNESIA) suspension 30 mL  30 mL Oral Daily PRN Coleman, Carolyn H, NP       OLANZapine  (ZYPREXA ) injection 5 mg  5 mg Intramuscular TID PRN Mardy Elveria DEL, NP   5 mg at 04/12/24 2230   OLANZapine  zydis (ZYPREXA ) disintegrating tablet 5 mg  5 mg Oral TID PRN Coleman, Carolyn H, NP   5 mg at 07/22/24 0034   polyethylene glycol (MIRALAX  / GLYCOLAX ) packet 17 g  17 g Oral Daily PRN Donnelly Mellow, MD   17 g at 06/04/24 2123    Lab Results:  No results found for this or any previous visit (from the past 48 hours).      Blood Alcohol level:  Lab Results  Component Value Date   [facility] <15 03/26/2024    Metabolic Disorder Labs: Lab Results  Component Value Date   HGBA1C 5.6 04/02/2024   MPG 114 04/02/2024   No results found for: PROLACTIN Lab Results  Component Value Date   CHOL 146 04/02/2024   TRIG 37 04/02/2024   HDL 43 04/02/2024   CHOLHDL 3.4 04/02/2024   VLDL 7 04/02/2024   LDLCALC 96 04/02/2024    Physical Findings: AIMS:  , ,  ,  ,    CIWA:    COWS:      Psychiatric Specialty Exam:  Presentation  General Appearance:  Appropriate for Environment; Bizarre  Eye Contact: Fair  Speech: Normal rate  Speech Volume: Normal    Mood and Affect  Mood: Fine Affect: Congruent   Thought Process  Thought Processes: Disorganized At baseline Descriptions of Associations:Intact  Orientation:Full (Time, Place and Person)  Thought Content: Impoverished Hallucinations: Denies  Ideas of Reference: denies Suicidal Thoughts: Denies  Homicidal Thoughts: Denies   Sensorium  Memory: impiared  Judgment: Impaired  Insight: Community Education Officer   Concentration: Fair  Attention Span: Fair  Recall: [employer] of Knowledge: Fair  Language: Fair   Psychomotor Activity  Psychomotor Activity: No data recorded  Musculoskeletal: Strength & Muscle Tone: within normal limits Gait & Station: normal Assets  Assets: Manufacturing Systems Engineer; Resilience    Physical Exam: Physical Exam Vitals and nursing note reviewed.    ROS Blood pressure 96/68, pulse 69, temperature  97.9 F (36.6 C), resp. rate 14, height 6' (1.829 m), weight 100 kg, SpO2 100%. Body mass index is 29.91 kg/m.  Diagnosis: Principal Problem:   Delusional disorder (HCC) Active Problems:   Laceration of left eyebrow   Major neurocognitive disorder [facility])  Major Neurocognitive disorder  Clinical Decision Making: Patient currently admitted after jumping off a two-story building in the context of possible delusions as reported as the people in the house he lived for 4 years trying to kill him.  Patient needs to be monitored closely for ongoing psychosis and paranoid delusions.  Given patient's confusion and chronic paranoia, no family support, APS has taken up guardianship and looking for placement     Safety and Monitoring:             -- InVoluntary admission to inpatient psychiatric unit for safety, stabilization and treatment             -- Daily contact with patient to assess and evaluate symptoms and progress in treatment             -- Patient's case to be discussed in multi-disciplinary team meeting             -- Observation Level: q15 minute checks             -- Vital signs:  q12 hours             -- Precautions: suicide, elopement, and assault   2. Psychiatric Diagnoses and Treatment:              Continue Prolixin  to 10 mg twice daily.  -- The risks/benefits/side-effects/alternatives to this medication were discussed in detail with the patient and time was given for questions. The patient consents to medication trial.                --  Metabolic profile and EKG monitoring obtained while on an atypical antipsychotic (BMI: Lipid Panel: HbgA1c: QTc:)              -- Encouraged patient to participate in unit milieu and in scheduled group therapies                            3. Medical Issues Being Addressed:    Fall on/8/25, CT head normal  4. Discharge Planning:   -- Social work and case management to assist with discharge planning and identification of hospital follow-up needs prior to discharge  -- Estimated LOS: 3-4 days  Kem Hensen, MD 07/25/2024, 4:07 PM

## 2024-07-25 NOTE — Group Note (Signed)
 Date:  07/25/2024 Time:  3:30 PM  Group Topic/Focus:  Movement therapy    Participation Level:  Did Not Attend    Benjamin [name] 07/25/2024, 3:30 PM

## 2024-07-25 NOTE — Progress Notes (Signed)
   07/24/24 2100  Psych Admission Type (Psych Patients Only)  Admission Status Involuntary  Psychosocial Assessment  Patient Complaints Confusion  Eye Contact Brief  Facial Expression Animated  Affect Flat  Speech Slow  Interaction Minimal  Motor Activity Slow  Appearance/Hygiene Unremarkable  Behavior Characteristics Cooperative;Calm  Mood Pleasant  Thought Process  Coherency WDL  Content WDL  Delusions None reported or observed  Perception WDL  Hallucination None reported or observed  Judgment Impaired  Confusion Mild  Danger to Self  Current suicidal ideation? Denies

## 2024-07-25 NOTE — Progress Notes (Signed)
 Patient without any changes from earlier assessment. Pt. Without HI/SI calm and cooperative.

## 2024-07-25 NOTE — Group Note (Signed)
 Date:  07/25/2024 Time:  11:27 AM  Group Topic/Focus:  Personal Choices and Values:   The focus of this group is to help patients assess and explore the importance of values in their lives, how their values affect their decisions, how they express their values and what opposes their expression.    Participation Level:  Active  Participation Quality:  Appropriate  Affect:  Appropriate  Cognitive:  Appropriate  Insight: Appropriate  Engagement in Group:  Engaged  Modes of Intervention:  Discussion  Additional Comments:  N/A  [name] Gavel 07/25/2024, 11:27 AM

## 2024-07-25 NOTE — Plan of Care (Signed)
   Problem: Clinical Measurements: Goal: Will remain free from infection Outcome: Progressing Goal: Diagnostic test results will improve Outcome: Progressing

## 2024-07-25 NOTE — Group Note (Signed)
 Date:  07/25/2024 Time:  8:44 PM  Group Topic/Focus:  Wrap-Up Group:   The focus of this group is to help patients review their daily goal of treatment and discuss progress on daily workbooks.    Participation Level:  Active  Participation Quality:  Appropriate  Affect:  Appropriate  Cognitive:  Alert  Insight: Appropriate  Engagement in Group:  Engaged  Modes of Intervention:  Discussion  Additional Comments:    Benjamin [name] 07/25/2024, 8:44 PM

## 2024-07-25 NOTE — BH IP Treatment Plan (Signed)
 Interdisciplinary Treatment and Diagnostic Plan Update  07/25/2024 Time of Session: 3:00 PM  Benjamin [name] MRN: [id number]  Principal Diagnosis: Delusional disorder [facility])  Secondary Diagnoses: Principal Problem:   Delusional disorder (HCC) Active Problems:   Laceration of left eyebrow   Major neurocognitive disorder (HCC)   Current Medications:  Current Facility-Administered Medications  Medication Dose Route Frequency Provider Last Rate Last Admin   acetaminophen  (TYLENOL ) tablet 650 mg  650 mg Oral Q6H PRN Coleman, Carolyn H, NP       alum & mag hydroxide-simeth (MAALOX/MYLANTA) 200-200-20 MG/5ML suspension 30 mL  30 mL Oral Q4H PRN Coleman, Carolyn H, NP       docusate sodium  (COLACE) capsule 100 mg  100 mg Oral Daily Jadapalle, Sree, MD   100 mg at 07/24/24 9041   fluPHENAZine  (PROLIXIN ) tablet 10 mg  10 mg Oral BID Shrivastava, Aryendra, MD   10 mg at 07/24/24 2052   magnesium  hydroxide (MILK OF MAGNESIA) suspension 30 mL  30 mL Oral Daily PRN Mardy Elveria DEL, NP       OLANZapine  (ZYPREXA ) injection 5 mg  5 mg Intramuscular TID PRN Mardy Elveria DEL, NP   5 mg at 04/12/24 2230   OLANZapine  zydis (ZYPREXA ) disintegrating tablet 5 mg  5 mg Oral TID PRN Coleman, Carolyn H, NP   5 mg at 07/22/24 0034   polyethylene glycol (MIRALAX  / GLYCOLAX ) packet 17 g  17 g Oral Daily PRN Donnelly Mellow, MD   17 g at 06/04/24 2123   PTA Medications: Medications Prior to Admission  Medication Sig Dispense Refill Last Dose/Taking   docusate sodium  (COLACE) 100 MG capsule Take 100 mg by mouth daily.      omega-3 acid ethyl esters (LOVAZA) 1 g capsule Take 1 g by mouth daily.       Patient Stressors: Traumatic event    Patient Strengths: Communication skills   Treatment Modalities: Medication Management, Group therapy, Case management,  1 to 1 session with clinician, Psychoeducation, Recreational therapy.   Physician Treatment Plan for Primary Diagnosis: Delusional disorder [name]) Long  Term Goal(s): Improvement in symptoms so as ready for discharge   Short Term Goals: Ability to identify changes in lifestyle to reduce recurrence of condition will improve Ability to verbalize feelings will improve Ability to disclose and discuss suicidal ideas Ability to demonstrate self-control will improve Ability to identify and develop effective coping behaviors will improve  Medication Management: Evaluate patient's response, side effects, and tolerance of medication regimen.  Therapeutic Interventions: 1 to 1 sessions, Unit Group sessions and Medication administration.  Evaluation of Outcomes: Progressing  Physician Treatment Plan for Secondary Diagnosis: Principal Problem:   Delusional disorder (HCC) Active Problems:   Laceration of left eyebrow   Major neurocognitive disorder (HCC)  Long Term Goal(s): Improvement in symptoms so as ready for discharge   Short Term Goals: Ability to identify changes in lifestyle to reduce recurrence of condition will improve Ability to verbalize feelings will improve Ability to disclose and discuss suicidal ideas Ability to demonstrate self-control will improve Ability to identify and develop effective coping behaviors will improve     Medication Management: Evaluate patient's response, side effects, and tolerance of medication regimen.  Therapeutic Interventions: 1 to 1 sessions, Unit Group sessions and Medication administration.  Evaluation of Outcomes: Progressing   RN Treatment Plan for Primary Diagnosis: Delusional disorder (HCC) Long Term Goal(s): Knowledge of disease and therapeutic regimen to maintain health will improve  Short Term Goals: Ability to remain free from injury  will improve, Ability to verbalize frustration and anger appropriately will improve, Ability to demonstrate self-control, Ability to participate in decision making will improve, Ability to verbalize feelings will improve, Ability to disclose and discuss  suicidal ideas, Ability to identify and develop effective coping behaviors will improve, and Compliance with prescribed medications will improve  Medication Management: RN will administer medications as ordered by provider, will assess and evaluate patient's response and provide education to patient for prescribed medication. RN will report any adverse and/or side effects to prescribing provider.  Therapeutic Interventions: 1 on 1 counseling sessions, Psychoeducation, Medication administration, Evaluate responses to treatment, Monitor vital signs and CBGs as ordered, Perform/monitor CIWA, COWS, AIMS and Fall Risk screenings as ordered, Perform wound care treatments as ordered.  Evaluation of Outcomes: Progressing   LCSW Treatment Plan for Primary Diagnosis: Delusional disorder [facility]) Long Term Goal(s): Safe transition to appropriate next level of care at discharge, Engage patient in therapeutic group addressing interpersonal concerns.  Short Term Goals: Engage patient in aftercare planning with referrals and resources, Increase social support, Increase ability to appropriately verbalize feelings, Increase emotional regulation, Facilitate acceptance of mental health diagnosis and concerns, Facilitate patient progression through stages of change regarding substance use diagnoses and concerns, Identify triggers associated with mental health/substance abuse issues, and Increase skills for wellness and recovery  Therapeutic Interventions: Assess for all discharge needs, 1 to 1 time with Social worker, Explore available resources and support systems, Assess for adequacy in community support network, Educate family and significant other(s) on suicide prevention, Complete Psychosocial Assessment, Interpersonal group therapy.  Evaluation of Outcomes: Progressing   Progress in Treatment: Attending groups: No. Participating in groups: No. Taking medication as prescribed: Yes. Toleration medication:  Yes. Family/Significant other contact made: Yes, individual(s) contacted:   Benjamin [name], borther, [phone] Patient understands diagnosis: No. Discussing patient identified problems/goals with staff: Yes. Medical problems stabilized or resolved: Yes. Denies suicidal/homicidal ideation: Yes. Issues/concerns per patient self-inventory: No. Other: None   New problem(s) identified: No, Describe:  05/14/24 Update: None  05/24/24 Update: No changes at this time.  Update 05/29/2024:  No changes at this time.  Update 06/03/2024:  No changes at this time. Update 06/08/24: No changes at this time Update 06/13/24: No changes at this time  Update 06/18/24: No changes at this time Update 06/23/24: No changes at this time  Update 06/28/24: No changes at this time Update 07/03/24: No changes at this time Update 07/09/24: No changes at this time Update 07/14/24: No changes at this time  Update 07/19/24: No changes at this time Update 07/25/24: No changes at this time             New Short Term/Long Term Goal(s): elimination of symptoms of psychosis, medication management for mood stabilization; elimination of SI thoughts; development of comprehensive mental wellness plan. Update 04/02/24: No changes at this time. Update 04/07/24: No changes at this time. Update 04/12/24: No changes at this time Update 04/18/24: No changes at this time  Update 04/23/24: No changes at this time. Update 04/28/2024: No changes at this time.  Update 05/04/2024: No changes at this time.  Update 05/09/2024:  No changes at this time. 05/14/24 Update: No changes at this time. 05/19/24 Update: No changes at this time. 05/24/24 Update: No changes at this time. Update 05/29/2024:  No changes at this time. Update 06/03/2024:  No changes at this time. Update 06/08/24: No changes at this time Update 06/13/24: No changes at this time Update 06/18/24: No changes at this time  Update 06/23/24: No changes at this time Update 06/28/24: No changes at this time   Update 07/03/24: No changes at this time Update 07/09/24: No changes at this time Update 07/14/24: No changes at this time Update 07/19/24: No changes at this time Update 07/25/24: No changes at this time     Patient Goals:  Get out of here, I want to get healed up so I can get out of here Update 04/02/24: No changes at this time. Update 04/07/24: No changes at this time .Update 04/12/24: No changes at this time Update 04/18/24: No changes at this time  Update 04/23/24: No changes at this time. Update 04/28/2024: No changes at this time. Update 05/04/2024: No changes at this time.   Update 05/29/2024:  No changes at this time.  Update 06/03/2024:  No changes at this time. Update 06/08/24: No changes at this time Update 06/13/24: No changes at this time Update 06/18/24: No changes at this time Update 06/23/24: No changes at this time Update 06/28/24: No changes at this time  Update 07/03/24: No changes at this time Update 07/09/24: No changes at this time  Update 07/14/24: No changes at this time Update 07/19/24: No changes at this time Update 07/25/24: No changes at this time     Discharge Plan or Barriers: CSW will assist with appropriate discharge planning  Update 04/02/24: No changes at this time. Update 04/07/24: No changes at this time.Update 04/12/24: No changes at this time Update 04/18/24: CSW submitted report to APS. Care team looking at guardianship for pt at this time  Update 04/23/24: No changes at this time. Update 04/28/2024: No changes at this time. Update 05/04/2024: No changes at this time.  Update 05/09/2024:  [facility] APS continues to search for placement. 05/14/24 Update: [facility] APS continues to look for placement. CSW to continue to assess. 05/19/24 Update: No changes at this time. 05/24/24 Update:[facility] continues to look for placement at this time Update 05/29/2024:  Patient remains on the unit and safe at this time.  Case was accepted by APS [facility] and they are looking for placement  for the patient.   Update 06/03/2024:  No changes at this time. Update 06/08/24: No changes at this time Update 06/13/24: [facility] APS having difficulty connecting with pt's care coordinator at [facility] to assist with placement Update 06/18/24: No changes at this time Update 06/23/24: Arch reports that she may have found placement for pt. CSW will send TB tests per her request Update 06/28/24: Arch reports pt currently awaiting approval of 1915i. Reports she should know more about placement next week.  Update 07/03/24: No changes at this time Update 07/09/24: Pt still pending placement according to APS. Pt's 1915i is also still pending according to APS. Update 07/14/24: No changes at this time Update 07/19/24: No changes at this time Update 07/25/24: No changes at this time     Reason for Continuation of Hospitalization: Delusions  Medication stabilization   Estimated Length of Stay: 1 to 7 days Update 04/02/24: TBD. Update 04/07/24: TBD Update 04/12/24:TBD. Update 04/28/24:TBD Update 05/04/2024: TBD Update 05/09/2024:  TBD  05/14/24 Update: TBD. 05/19/24 Update: TBD. 05/24/24 Update: TBD  Update 05/29/2024:  TBD.  Update 06/03/2024:  TBD Update 06/08/24: TBD Update 06/13/24: TBD Update 06/18/24: TBD Update 06/23/24: TBD Update 06/28/24: TBD Update 07/03/24: TBD Update 07/09/24: TBD  Update 07/14/24: TBD Update 07/19/24: TBD Update 07/25/24: TBD  Last 3 [place] Suicide Severity Risk Score: Flowsheet Row Admission (Current) from 03/27/2024  in [facility] BEHAVIORAL MEDICINE ED from 03/26/2024 in [name] Emergency Department at [facility] ED from 03/25/2024 in [facility]  C-SSRS RISK CATEGORY No Risk No Risk No Risk    Last [facility] 2/9 Scores:     No data to display          Scribe for Treatment Team: [name], ISRAEL 07/25/2024 9:24 AM

## 2024-07-25 NOTE — Progress Notes (Signed)
   07/25/24 2200  Psych Admission Type (Psych Patients Only)  Admission Status Involuntary  Psychosocial Assessment  Patient Complaints Confusion  Eye Contact Brief  Facial Expression Flat  Affect Flat  Speech Slow  Interaction Minimal  Motor Activity Slow  Appearance/Hygiene In scrubs  Behavior Characteristics Appropriate to situation;Cooperative  Mood Pleasant  Thought Process  Coherency WDL  Content WDL  Delusions None reported or observed  Perception WDL  Hallucination None reported or observed  Judgment Impaired  Confusion Mild  Danger to Self  Current suicidal ideation? Denies  Agreement Not to Harm Self Yes  Description of Agreement Verbal  Danger to Others  Danger to Others None reported or observed

## 2024-07-26 NOTE — Plan of Care (Signed)
  Problem: Education: Goal: Knowledge of General Education information will improve Description: Including pain rating scale, medication(s)/side effects and non-pharmacologic comfort measures Outcome: Not Progressing   Problem: Nutrition: Goal: Adequate nutrition will be maintained Outcome: Progressing   Problem: Safety: Goal: Ability to remain free from injury will improve Outcome: Progressing

## 2024-07-26 NOTE — Progress Notes (Signed)
   07/26/24 0530  15 Minute Checks  Location Bedroom  Visual Appearance Calm  Behavior Sleeping  OTHER  Documented sleep last 24 hours 7.5  Sleep (Behavioral Health Patients Only)  Calculate sleep? (Click Yes once per 24 hr at 0600 safety check) Yes
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
## 2024-07-26 NOTE — Group Note (Signed)
 Recreation Therapy Group Note   Group Topic:Communication  Group Date: 07/26/2024 Start Time: 1400 End Time: 1450 Facilitators: Celestia Jeoffrey BRAVO, LRT, CTRS Location: Dayroom  Group Description: Reminisce Conversation Cards. Patients drew a laminated card out of a bag that had a word or phrase on it. Pt encouraged to speak about a time in their life or fond memory that specifically relates to the word they chose out of the bag. An example would be: "parenthood, meals, siblings, travel, or home".  LRT prompted following questions and encouraged contribution from peers to increase communication.   Goal Area(s) Addressed: Patient will increase verbal communication by conversing with peers. Patient will contribute to group discussion with minimal prompting. Patient will reminisce a positive memory or moment in their life.   Affect/Mood: N/A   Participation Level: Did not attend    Clinical Observations/Individualized Feedback: Patient did not attend group.   Plan: Continue to engage patient in RT group sessions 2-3x/week.   Jeoffrey BRAVO Celestia, LRT, CTRS 07/26/2024 4:58 PM

## 2024-07-26 NOTE — Progress Notes (Signed)
   07/26/24 1100  Psych Admission Type (Psych Patients Only)  Admission Status Involuntary  Psychosocial Assessment  Patient Complaints Confusion  Eye Contact Brief  Facial Expression Flat  Affect Flat  Speech Slow  Interaction Minimal  Motor Activity Slow  Appearance/Hygiene In scrubs  Behavior Characteristics Appropriate to situation  Mood Pleasant  Aggressive Behavior  Effect No apparent injury  Thought Process  Coherency WDL  Content WDL  Delusions None reported or observed  Perception WDL  Hallucination None reported or observed  Judgment Impaired  Confusion Mild  Danger to Self  Current suicidal ideation? Denies  Agreement Not to Harm Self Yes  Description of Agreement verbal

## 2024-07-26 NOTE — Group Note (Signed)
 Date:  07/26/2024 Time:  10:49 AM  Group Topic/Focus:  Managing Feelings:   The focus of this group is to identify what feelings patients have difficulty handling and develop a plan to handle them in a healthier way upon discharge.    Participation Level:  None  Participation Quality:  Drowsy  Affect:  Not Congruent  Cognitive:  Lacking  Insight: None  Engagement in Group:  None  Modes of Intervention:  Activity  Additional Comments:  N/A  [name] Gavel 07/26/2024, 10:49 AM

## 2024-07-26 NOTE — [facility] Group Notes (Signed)
 Spirituality Group   Description: Participant directed exploration of values, beliefs and meaning   **Focus on locating spiritual moments, meaning making and connection.  Following a brief framework of chaplain's role and ground rules of group behavior, participants are invited to share concerns or questions that engage spiritual life. Emphasis placed on common themes and shared experiences and ways to make meaning and clarify living into one's values.   Theory/Process/Goal: Utilize the theoretical framework of group therapy established by Celena Kite, Relational Cultural Theory and Rogerian approaches to facilitate relational empathy and use of the "here and now" to foster reflection, self-awareness, and sharing.   Observations: Benjamin [name] was present for most of the group and participated in the discussion when prompted/invited to contribute.  [name] L. [name] HERO.Div

## 2024-07-26 NOTE — Group Note (Signed)
 Date:  07/26/2024 Time:  8:46 PM  Group Topic/Focus:  Wrap-Up Group:   The focus of this group is to help patients review their daily goal of treatment and discuss progress on daily workbooks.    Participation Level:  Did Not Attend  Beatris ONEIDA Hasten 07/26/2024, 8:46 PM

## 2024-07-26 NOTE — Group Note (Signed)
 LCSW Group Therapy Note  Group Date: 07/26/2024 Start Time: 1320 End Time: 1445   Type of Therapy and Topic:  Group Therapy - Healthy vs Unhealthy Coping Skills  Participation Level:  Did Not Attend   Description of Group The focus of this group was to determine what unhealthy coping techniques typically are used by group members and what healthy coping techniques would be helpful in coping with various problems. Patients were guided in becoming aware of the differences between healthy and unhealthy coping techniques. Patients were asked to identify 2-3 healthy coping skills they would like to learn to use more effectively.  Therapeutic Goals Patients learned that coping is what human beings do all day long to deal with various situations in their lives Patients defined and discussed healthy vs unhealthy coping techniques Patients identified their preferred coping techniques and identified whether these were healthy or unhealthy Patients determined 2-3 healthy coping skills they would like to become more familiar with and use more often. Patients provided support and ideas to each other   Summary of Patient Progress: X   Therapeutic Modalities Cognitive Behavioral Therapy Motivational Interviewing  [name], [place] 07/26/2024  2:49 PM

## 2024-07-26 NOTE — Plan of Care (Signed)
  Problem: Coping: Goal: Level of anxiety will decrease Outcome: Progressing   Problem: Education: Goal: Emotional status will improve Outcome: Progressing Goal: Mental status will improve Outcome: Progressing   Problem: Activity: Goal: Sleeping patterns will improve Outcome: Progressing

## 2024-07-26 NOTE — Progress Notes (Signed)
   07/25/24 1350  Spiritual Encounters  Type of Visit Follow up  Care provided to: Patient  Referral source Chaplain assessment  Reason for visit Routine spiritual support  OnCall Visit No  Interventions  Spiritual Care Interventions Made Prayer;Compassionate presence   I provided ongoing support to Benjamin [name] as he continues to experience a long LOS and is frequently isolating.  Benjamin Haggart remembered me and welcomed my visit. He asked for prayer which I offered. I shared my gratitude to see him and encouraged him to join us  out in the dayroom for group.  Lovella Hardie L. [name] HERO.Div

## 2024-07-27 NOTE — Plan of Care (Signed)
   Problem: Activity: Goal: Risk for activity intolerance will decrease Outcome: Progressing   Problem: Nutrition: Goal: Adequate nutrition will be maintained Outcome: Progressing   Problem: Coping: Goal: Level of anxiety will decrease Outcome: Progressing

## 2024-07-27 NOTE — Plan of Care (Signed)
  Problem: Health Behavior/Discharge Planning: Goal: Ability to manage health-related needs will improve Outcome: Progressing   Problem: Activity: Goal: Risk for activity intolerance will decrease Outcome: Progressing   Problem: Nutrition: Goal: Adequate nutrition will be maintained Outcome: Progressing   Problem: Skin Integrity: Goal: Risk for impaired skin integrity will decrease Outcome: Progressing   Problem: Safety: Goal: Ability to remain free from injury will improve Outcome: Progressing
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
## 2024-07-27 NOTE — Group Note (Signed)
 Date:  07/27/2024 Time:  4:44 PM  Group Topic/Focus:  Rediscovering Joy:   The focus of this group is to explore various ways to relieve stress in a positive manner.    Participation Level:  Minimal  Participation Quality:  Appropriate and Resistant  Affect:  Lethargic    Insight: Limited  Engagement in Group:  Lacking and Poor  Modes of Intervention:  Discussion  Additional Comments:   Pt Slept in the dayroom during group  Maglione,Airica Schwartzkopf E 07/27/2024, 4:44 PM

## 2024-07-27 NOTE — Progress Notes (Signed)
   07/27/24 1600  Psych Admission Type (Psych Patients Only)  Admission Status Involuntary  Psychosocial Assessment  Patient Complaints Worrying;Confusion  Eye Contact Fair  Facial Expression Flat  Affect Flat  Speech Slow  Interaction Minimal  Motor Activity Slow  Appearance/Hygiene In scrubs  Behavior Characteristics Appropriate to situation  Mood Pleasant  Thought Process  Coherency WDL  Content WDL  Delusions None reported or observed  Perception WDL  Hallucination None reported or observed  Judgment Impaired  Confusion Mild  Danger to Self  Current suicidal ideation? Denies  Danger to Others  Danger to Others None reported or observed

## 2024-07-27 NOTE — Plan of Care (Signed)

## 2024-07-27 NOTE — Group Note (Signed)
 Date:  07/27/2024 Time:  11:05 AM  Group Topic/Focus:  Making Healthy Choices:   The focus of this group is to help patients identify negative/unhealthy choices they were using prior to admission and identify positive/healthier coping strategies to replace them upon discharge.    Participation Level:  Active  Participation Quality:  Appropriate  Affect:  Appropriate  Cognitive:  Appropriate  Insight: Appropriate  Engagement in Group:  Engaged  Modes of Intervention:  Discussion   Arland Nutting 07/27/2024, 11:05 AM

## 2024-07-27 NOTE — Group Note (Signed)
 Physical/Occupational Therapy Group Note  Group Topic: Neurographic Art  Group Date: 07/27/2024 Start Time: 1300 End Time: 1400 Facilitators: Clive Warren CROME, OT   Group Description: Group participated with Neurographic art activity, using watercolor paints to facilitate creative expression and meditation/relaxation for each individual.  Incorporated bimanual coordination, mental focus, emotional processing, task/command following and relaxation techniques as appropriate.  Patients engaged socially with therapist and other group participants throughout session. Allowed to ask questions as appropriate, and encouraged to identify ways they could use/share their creations with themselves and others.  Therapeutic Goal(s):  Demonstrate ability to independently manipulate utensils required to participate with and complete activity. Demonstrate ability to cognitively focus on task and follow commands necessary for completion. Demonstrate use of art as an outlet for emotional processing and expression. Identify and demonstrate importance of relaxation, neural calming and meditation for improved participation with life groups.  Individual Participation: Pt agreeable to participate and receptive to instruction throughout. Pt required minimal to moderate multimodal cues for following instructions/sequencing of initial steps to the activity and improving independence once initiated painting. Engaged minimally in group discussion with prompting.   Participation Level: Moderate and Engaged   Participation Quality: Minimal Cues and Moderate Cues   Behavior: Alert, Calm, Cooperative, and Reserved   Speech/Thought Process: Focused   Affect/Mood: Flat   Insight: Poor   Judgement: Poor   Modes of Intervention: Activity, Clarification, Discussion, Education, Exploration, Problem-solving, Rapport Building, Socialization, and Support  Patient Response to Interventions:  Attentive, Engaged, and Receptive    Plan: Continue to engage patient in PT/OT groups 1 - 2x/week.  Dyesha Henault R., MPH, MS, OTR/L ascom [phone] 07/27/24, 3:05 PM

## 2024-07-27 NOTE — Group Note (Signed)
 Recreation Therapy Group Note   Group Topic:Leisure Education  Group Date: 07/27/2024 Start Time: 1415 End Time: 1440 Facilitators: Celestia Jeoffrey BRAVO, LRT, CTRS Location: Dayroom  Group Description: Music. Patients are encouraged to name their favorite song(s) for LRT to play song through speaker for group to hear, while in the courtyard getting fresh air and sunlight. Patients educated on the definition of leisure and the importance of having different leisure interests outside of the hospital. Group discussed how leisure activities can often be used as pharmacologist and that listening to music and being outside are examples.    Goal Area(s) Addressed:  Patient will identify a current leisure interest.  Patient will practice making a positive decision. Patient will have the opportunity to try a new leisure activity.   Affect/Mood: Asleep   Participation Level: Non-verbal    Clinical Observations/Individualized Feedback: Benjamin [name] was present in group, however, he was asleep duration of session.   Plan: Continue to engage patient in RT group sessions 2-3x/week.   Jeoffrey BRAVO Celestia, LRT, CTRS 07/27/2024 5:09 PM

## 2024-07-27 NOTE — Progress Notes (Signed)
   07/26/24 2100  Psych Admission Type (Psych Patients Only)  Admission Status Involuntary  Psychosocial Assessment  Patient Complaints Confusion  Eye Contact Brief  Facial Expression Flat  Affect Flat  Speech Slow  Interaction Minimal  Motor Activity Slow  Appearance/Hygiene In scrubs  Behavior Characteristics Appropriate to situation  Mood Pleasant  Thought Process  Coherency WDL  Content WDL  Delusions None reported or observed  Perception WDL  Hallucination None reported or observed  Judgment Impaired  Confusion Mild  Danger to Self  Current suicidal ideation? Denies

## 2024-07-27 NOTE — NC FL2 (Signed)
  [place]  MEDICAID FL2 LEVEL OF CARE FORM     IDENTIFICATION  Patient Name: Benjamin [name] Birthdate: 03-Mar-1946 Sex: male Admission Date (Current Location): 03/27/2024  [place] Number:  [place] [id number] [facility] Facility and Address:  [facility], [address], [place], [place] [address]      Provider Number: [id number]  Attending Physician Name and Address:  Donnelly Mellow, MD  Relative Name and Phone Number:  [name], APS Caseworker, [phone]    Current Level of Care: Hospital Recommended Level of Care: Nursing Facility Prior Approval Number:    Date Approved/Denied:   PASRR Number:    Discharge Plan: Other (Comment) (NF)    Current Diagnoses: Patient Active Problem List   Diagnosis Date Noted   Major neurocognitive disorder (HCC) 07/09/2024   Laceration of left eyebrow 07/01/2024   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   Psychosis (HCC) 03/26/2024    Orientation RESPIRATION BLADDER Height & Weight     Self  Normal Continent Weight: 220 lb 8 oz (100 kg) Height:  6' (182.9 cm)  BEHAVIORAL SYMPTOMS/MOOD NEUROLOGICAL BOWEL NUTRITION STATUS  Dangerous to self, others or property   Continent    AMBULATORY STATUS COMMUNICATION OF NEEDS Skin   Independent Verbally Normal                       Personal Care Assistance Level of Assistance              Functional Limitations Info  Speech     Speech Info: Adequate    SPECIAL CARE FACTORS FREQUENCY  Blood pressure Blood Pressure Frequency: 2x times daily                  Contractures Contractures Info: Not present    Additional Factors Info  Code Status, Allergies, Psychotropic Code Status Info: FULL Allergies Info: None Psychotropic Info: fluPHENAZine  (PROLIXIN ) tablet 10 mg         Current Medications (07/27/2024):  This is the current hospital active medication list Current Facility-Administered Medications  Medication Dose Route Frequency  Provider Last Rate Last Admin   acetaminophen  (TYLENOL ) tablet 650 mg  650 mg Oral Q6H PRN Coleman, Carolyn H, NP       alum & mag hydroxide-simeth (MAALOX/MYLANTA) 200-200-20 MG/5ML suspension 30 mL  30 mL Oral Q4H PRN Coleman, Carolyn H, NP       docusate sodium  (COLACE) capsule 100 mg  100 mg Oral Daily Jadapalle, Sree, MD   100 mg at 07/27/24 9068   fluPHENAZine  (PROLIXIN ) tablet 10 mg  10 mg Oral BID Shrivastava, Aryendra, MD   10 mg at 07/27/24 0931   magnesium  hydroxide (MILK OF MAGNESIA) suspension 30 mL  30 mL Oral Daily PRN Coleman, Carolyn H, NP       OLANZapine  (ZYPREXA ) injection 5 mg  5 mg Intramuscular TID PRN Mardy Elveria DEL, NP   5 mg at 04/12/24 2230   OLANZapine  zydis (ZYPREXA ) disintegrating tablet 5 mg  5 mg Oral TID PRN Coleman, Carolyn H, NP   5 mg at 07/22/24 0034   polyethylene glycol (MIRALAX  / GLYCOLAX ) packet 17 g  17 g Oral Daily PRN Donnelly Mellow, MD   17 g at 06/04/24 2123     Discharge Medications: Please see discharge summary for a list of discharge medications.  Relevant Imaging Results:  Relevant Lab Results:   Additional Information SSN# [id number]  [name], LCSWA

## 2024-07-28 NOTE — Progress Notes (Signed)
   07/27/24 2016  Psych Admission Type (Psych Patients Only)  Admission Status Involuntary  Psychosocial Assessment  Patient Complaints Confusion  Eye Contact Fair  Facial Expression Animated  Affect Preoccupied  Speech Slow  Interaction Minimal  Motor Activity Slow  Appearance/Hygiene In scrubs  Behavior Characteristics Appropriate to situation  Mood Preoccupied;Pleasant  Aggressive Behavior  Effect No apparent injury  Thought Process  Coherency WDL  Content WDL  Delusions None reported or observed  Perception WDL  Hallucination None reported or observed  Judgment Impaired  Confusion Mild  Danger to Self  Current suicidal ideation? Denies  Self-Injurious Behavior No self-injurious ideation or behavior indicators observed or expressed   Agreement Not to Harm Self No  Description of Agreement Verbal  Danger to Others  Danger to Others None reported or observed

## 2024-07-28 NOTE — BHH Counselor (Signed)
 Pt was visited by APS  caseworker [name].   Arch requested pt's card information.   CSW provided card information of bank card in pt's belongings.   Arch confirmed meetings on Monday 07/30/24 with pt's care management agency, [facility] regarding placement.   Lum Croft, MSW, [place] 07/28/2024 2:31 PM

## 2024-07-28 NOTE — Progress Notes (Signed)
   07/28/24 1940  Psych Admission Type (Psych Patients Only)  Admission Status Involuntary  Psychosocial Assessment  Patient Complaints None  Eye Contact Brief  Facial Expression Animated  Affect Preoccupied  Speech Soft  Interaction Minimal  Motor Activity Slow  Appearance/Hygiene In scrubs  Behavior Characteristics Appropriate to situation;Cooperative  Mood Pleasant  Aggressive Behavior  Effect No apparent injury  Thought Process  Coherency WDL  Content WDL  Delusions None reported or observed  Perception WDL  Hallucination None reported or observed  Judgment Impaired  Confusion Mild  Danger to Self  Current suicidal ideation? Denies  Self-Injurious Behavior No self-injurious ideation or behavior indicators observed or expressed   Agreement Not to Harm Self No  Description of Agreement Verbal  Danger to Others  Danger to Others None reported or observed

## 2024-07-28 NOTE — Plan of Care (Signed)
   Problem: Health Behavior/Discharge Planning: Goal: Ability to manage health-related needs will improve Outcome: Progressing

## 2024-07-28 NOTE — Plan of Care (Signed)
  Problem: Clinical Measurements: Goal: Ability to maintain clinical measurements within normal limits will improve Outcome: Progressing   Problem: Pain Managment: Goal: General experience of comfort will improve and/or be controlled Outcome: Progressing   Problem: Safety: Goal: Ability to remain free from injury will improve Outcome: Progressing   Problem: Safety: Goal: Periods of time without injury will increase Outcome: Progressing

## 2024-07-28 NOTE — Group Note (Signed)
 Date:  07/28/2024 Time:  11:16 AM  Group Topic/Focus:  Coping With Mental Health Crisis:   The purpose of this group is to help patients identify strategies for coping with mental health crisis.  Group discusses possible causes of crisis and ways to manage them effectively.    Participation Level:  Minimal  Participation Quality:  Resistant  Affect:  Flat and Not Congruent  Cognitive:  Alert and Lacking  Insight: Limited  Engagement in Group:  Limited and Off Topic  Modes of Intervention:  Activity, Discussion, Orientation, and Socialization  Additional Comments:  Pt very lethargic and not on topic throughout group  Maglione,Walid Haig E 07/28/2024, 11:16 AM

## 2024-07-28 NOTE — Progress Notes (Signed)
 [facility] MD Progress Note  07/28/2024 12:11 PM Benjamin [name]  MRN:  [id number] Benjamin [name] is a 78 year old male who presents to the inpatient geriatric psych unit after jumping from a two story building. Patient was originally seen at [facility] who referred him to the ED who admitted him here on 03/27/2024. Patient reports that people from the [place] gang were trying to kill him and the only escape was through the bedroom window on the second floor. He currently lives in the house with [name] and his family and has been living with them for 3-4 years without any problems. On 03/19/2024 he went to the doctor and when he got back home the family wouldn't let him leave. He states that after he realized the gang was going to kill him he barricaded the door with his bed and jumped out the window. When he landed he ran away until one of his neighbors found him and called 911. Patient is admitted to [facility] unit with Q15 min safety monitoring. Multidisciplinary team approach is offered. Medication management; group/milieu therapy is offered.     Subjective: Chart is reviewed and discussed with the treatment team.  APS is working on legal guardianship and placement.  He has guardianship hearing June 19, 2024  Patient is noted to be sitting in the day area.  He was dozing off.  Per nursing patient is noted to be getting more confused day-by-day but calm and cooperative.  He did greet the provider.  He is not endorsing SI/HI/plan and denies auditory/visual hallucinations.  He has fair appetite and sleep Psychiatric History: see h&P  Family History:  Family History  Problem Relation Age of Onset   Colon cancer Neg Hx    Colon polyps Neg Hx    Stomach cancer Neg Hx    Esophageal cancer Neg Hx    Social History:  Social History   Substance and Sexual Activity  Alcohol Use No     Social History   Substance and Sexual Activity  Drug Use No    Social History    Socioeconomic History   Marital status: Single    Spouse name: Not on file   Number of children: Not on file   Years of education: Not on file   Highest education level: Not on file  Occupational History   Not on file  Tobacco Use   Smoking status: Former    Types: Cigarettes   Smokeless tobacco: Never  Vaping Use   Vaping status: Never Used  Substance and Sexual Activity   Alcohol use: No   Drug use: No   Sexual activity: Not on file  Other Topics Concern   Not on file  Social History Narrative   Not on file   Social Drivers of Health   Financial Resource Strain: Not on file  Food Insecurity: No Food Insecurity (03/27/2024)   Hunger Vital Sign    Worried About Running Out of Food in the Last Year: Never true    Ran Out of Food in the Last Year: Never true  Transportation Needs: No Transportation Needs (03/27/2024)   PRAPARE - Administrator, Civil Service (Medical): No    Lack of Transportation (Non-Medical): No  Physical Activity: Not on file  Stress: Not on file  Social Connections: Moderately Integrated (03/27/2024)   Social Connection and Isolation Panel    Frequency of Communication with Friends and Family: Twice a week    Frequency of Social Gatherings with  Friends and Family: Once a week    Attends Religious Services: 1 to 4 times per year    Active Member of Clubs or Organizations: No    Attends Engineer, Structural: 1 to 4 times per year    Marital Status: Divorced   Past Medical History:  Past Medical History:  Diagnosis Date   Allergy    Arthritis    back   Prostate cancer (HCC) dx'd 2010   surg only    Past Surgical History:  Procedure Laterality Date   COLONOSCOPY     EYE SURGERY     PROSTATE SURGERY      Current Medications: Current Facility-Administered Medications  Medication Dose Route Frequency Provider Last Rate Last Admin   acetaminophen  (TYLENOL ) tablet 650 mg  650 mg Oral Q6H PRN Coleman, Carolyn H, NP        alum & mag hydroxide-simeth (MAALOX/MYLANTA) 200-200-20 MG/5ML suspension 30 mL  30 mL Oral Q4H PRN Coleman, Carolyn H, NP       docusate sodium  (COLACE) capsule 100 mg  100 mg Oral Daily Sophie Quiles, MD   100 mg at 07/28/24 0840   fluPHENAZine  (PROLIXIN ) tablet 10 mg  10 mg Oral BID Shrivastava, Aryendra, MD   10 mg at 07/28/24 0841   magnesium  hydroxide (MILK OF MAGNESIA) suspension 30 mL  30 mL Oral Daily PRN Mardy Elveria DEL, NP       OLANZapine  (ZYPREXA ) injection 5 mg  5 mg Intramuscular TID PRN Mardy Elveria DEL, NP   5 mg at 04/12/24 2230   OLANZapine  zydis (ZYPREXA ) disintegrating tablet 5 mg  5 mg Oral TID PRN Coleman, Carolyn H, NP   5 mg at 07/22/24 0034   polyethylene glycol (MIRALAX  / GLYCOLAX ) packet 17 g  17 g Oral Daily PRN Donnelly Mellow, MD   17 g at 06/04/24 2123    Lab Results:  No results found for this or any previous visit (from the past 48 hours).      Blood Alcohol level:  Lab Results  Component Value Date   [facility] <15 03/26/2024    Metabolic Disorder Labs: Lab Results  Component Value Date   HGBA1C 5.6 04/02/2024   MPG 114 04/02/2024   No results found for: PROLACTIN Lab Results  Component Value Date   CHOL 146 04/02/2024   TRIG 37 04/02/2024   HDL 43 04/02/2024   CHOLHDL 3.4 04/02/2024   VLDL 7 04/02/2024   LDLCALC 96 04/02/2024    Physical Findings: AIMS:  , ,  ,  ,    CIWA:    COWS:      Psychiatric Specialty Exam:  Presentation  General Appearance:  Appropriate for Environment; Bizarre  Eye Contact: Fair  Speech: Normal rate  Speech Volume: Normal    Mood and Affect  Mood: Fine Affect: Congruent   Thought Process  Thought Processes: Disorganized At baseline Descriptions of Associations:Intact  Orientation:Full (Time, Place and Person)  Thought Content: Impoverished Hallucinations: Denies  Ideas of Reference: denies Suicidal Thoughts: Denies  Homicidal Thoughts: Denies   Sensorium   Memory: impiared  Judgment: Impaired  Insight: Community Education Officer  Concentration: Fair  Attention Span: Fair  Recall: [employer] of Knowledge: Fair  Language: Fair   Psychomotor Activity  Psychomotor Activity: No data recorded  Musculoskeletal: Strength & Muscle Tone: within normal limits Gait & Station: normal Assets  Assets: Manufacturing Systems Engineer; Resilience    Physical Exam: Physical Exam Vitals and nursing note reviewed.  ROS Blood pressure 90/75, pulse 70, temperature 98 F (36.7 C), resp. rate 19, height 6' (1.829 m), weight 100 kg, SpO2 96%. Body mass index is 29.91 kg/m.  Diagnosis: Principal Problem:   Delusional disorder (HCC) Active Problems:   Laceration of left eyebrow   Major neurocognitive disorder [facility])  Major Neurocognitive disorder  Clinical Decision Making: Patient currently admitted after jumping off a two-story building in the context of possible delusions as reported as the people in the house he lived for 4 years trying to kill him.  Patient needs to be monitored closely for ongoing psychosis and paranoid delusions.  Given patient's confusion and chronic paranoia, no family support, APS has taken up guardianship and looking for placement     Safety and Monitoring:             -- InVoluntary admission to inpatient psychiatric unit for safety, stabilization and treatment             -- Daily contact with patient to assess and evaluate symptoms and progress in treatment             -- Patient's case to be discussed in multi-disciplinary team meeting             -- Observation Level: q15 minute checks             -- Vital signs:  q12 hours             -- Precautions: suicide, elopement, and assault   2. Psychiatric Diagnoses and Treatment:              Continue Prolixin  to 10 mg twice daily.  -- The risks/benefits/side-effects/alternatives to this medication were discussed in detail with the patient and time was  given for questions. The patient consents to medication trial.                -- Metabolic profile and EKG monitoring obtained while on an atypical antipsychotic (BMI: Lipid Panel: HbgA1c: QTc:)              -- Encouraged patient to participate in unit milieu and in scheduled group therapies                            3. Medical Issues Being Addressed:    Fall on/8/25, CT head normal  4. Discharge Planning:   -- Social work and case management to assist with discharge planning and identification of hospital follow-up needs prior to discharge  -- Estimated LOS: 3-4 days  Zaven Klemens, MD 07/28/2024, 12:11 PM

## 2024-07-28 NOTE — Group Note (Signed)
 Date:  07/28/2024 Time:  3:32 PM  Group Topic/Focus:  Movement Therapy    Participation Level:  None  Participation Quality:  none  Affect:  none  Cognitive:  none  Insight: None  Engagement in Group:  None  Modes of Intervention:  Activity  Additional Comments:  fell asleep in group  [name] 07/28/2024, 3:32 PM

## 2024-07-29 NOTE — Group Note (Signed)
 LCSW Group Therapy Note  Group Date: 07/29/2024 Start Time: 1045 End Time: 1130   Type of Therapy and Topic:  Group Therapy: Using I Statements  Participation Level:  None  Description of Group:  Patients were asked to provide details of some interpersonal conflicts they have experienced. Patients were then educated about "I" statements, communication which focuses on feelings or views of the speaker rather than what the other person is doing. T group members were asked to reflect on past conflicts and to provide specific examples for utilizing "I" statements.  Therapeutic Goals:  Patients will verbalize understanding of ineffective communication and effective communication. Patients will be able to empathize with whom they are having conflict. Patients will practice effective communication in the form of "I" statements.    Summary of Patient Progress: The patient attended group but did not participate.  The pt was respectful of peers.  Therapeutic Modalities:   Cognitive Behavioral Therapy Solution-Focused Therapy    Rexene LELON Chesley KEN 07/29/2024  12:46 PM

## 2024-07-29 NOTE — Group Note (Signed)
 Date:  07/29/2024 Time:  9:21 PM  Group Topic/Focus:  Wrap-Up Group:   The focus of this group is to help patients review their daily goal of treatment and discuss progress on daily workbooks.    Participation Level:  Active  Participation Quality:  Appropriate  Affect:  Appropriate  Cognitive:  Alert  Insight: Appropriate  Engagement in Group:  Engaged  Modes of Intervention:  Discussion  Additional Comments:    Benjamin [name] Bunker 07/29/2024, 9:21 PM

## 2024-07-29 NOTE — Progress Notes (Signed)
   07/29/24 2300  Psych Admission Type (Psych Patients Only)  Admission Status Involuntary  Psychosocial Assessment  Patient Complaints None  Eye Contact Brief  Facial Expression Animated  Affect Preoccupied  Speech Soft  Interaction Minimal  Motor Activity Slow  Appearance/Hygiene In scrubs  Behavior Characteristics Appropriate to situation  Mood Pleasant  Thought Process  Coherency WDL  Content WDL  Delusions None reported or observed  Perception WDL  Hallucination None reported or observed  Judgment Impaired  Confusion Mild  Danger to Self  Current suicidal ideation? Denies  Self-Injurious Behavior No self-injurious ideation or behavior indicators observed or expressed   Agreement Not to Harm Self Yes  Description of Agreement Verbal  Danger to Others  Danger to Others None reported or observed

## 2024-07-29 NOTE — Group Note (Signed)
 Date:  07/29/2024 Time:  10:43 AM  Group Topic/Focus:  Goals/Emotion Regulation Group:   The focus of this group is to help patients establish daily goals to achieve during treatment and discuss how the patient can incorporate goal setting into their daily lives to aide in recovery. Pt also learned what emotion regulation was and shared their thoughts and opinion on the topic.   Participation Level:  None  Benjamin [name] [name] 07/29/2024, 10:43 AM

## 2024-07-29 NOTE — Group Note (Signed)
 Date:  07/29/2024 Time:  5:23 PM  Group Topic/Focus:  Self Care:   The focus of this group is to help patients understand the importance of self-care in order to improve or restore emotional, physical, spiritual, interpersonal, and financial health.    Participation Level:  Did Not Attend  Larrie Leita BRAVO 07/29/2024, 5:23 PM

## 2024-07-29 NOTE — Plan of Care (Signed)
   Problem: Activity: Goal: Interest or engagement in activities will improve Outcome: Progressing   Problem: Coping: Goal: Ability to verbalize frustrations and anger appropriately will improve Outcome: Progressing

## 2024-07-29 NOTE — Progress Notes (Signed)
   07/29/24 1600  Psych Admission Type (Psych Patients Only)  Admission Status Involuntary  Psychosocial Assessment  Patient Complaints None  Eye Contact Brief  Facial Expression Animated  Affect Preoccupied  Speech Soft  Interaction Minimal  Motor Activity Slow  Appearance/Hygiene In scrubs  Behavior Characteristics Appropriate to situation  Mood Pleasant  Thought Process  Coherency WDL  Content WDL  Delusions None reported or observed  Perception WDL  Hallucination None reported or observed  Judgment Impaired  Confusion Mild  Danger to Self  Current suicidal ideation? Denies  Danger to Others  Danger to Others None reported or observed

## 2024-07-29 NOTE — Progress Notes (Signed)
 [name] MD Progress Note  07/29/2024 12:21 PM Yaxiel Minnie  MRN:  [id number] Chane Cowden is a 78 year old male who presents to the inpatient geriatric psych unit after jumping from a two story building. Patient was originally seen at [facility] who referred him to the ED who admitted him here on 03/27/2024. Patient reports that people from the [place] gang were trying to kill him and the only escape was through the bedroom window on the second floor. He currently lives in the house with [name] and his family and has been living with them for 3-4 years without any problems. On 03/19/2024 he went to the doctor and when he got back home the family wouldn't let him leave. He states that after he realized the gang was going to kill him he barricaded the door with his bed and jumped out the window. When he landed he ran away until one of his neighbors found him and called 911. Patient is admitted to [facility] unit with Q15 min safety monitoring. Multidisciplinary team approach is offered. Medication management; group/milieu therapy is offered.     Subjective: Chart is reviewed and discussed with the treatment team.  APS is working on legal guardianship and placement.  He has guardianship hearing June 19, 2024 Patient is noted to be sitting in the day area, nodding off.  He was encouraged to sleep in his room but he smiles and reports that he prefers to stay in the day area.  He offers no complaints.  Per nursing he is taking his medications and denies having any side effects.  Treatment team is able to do this if patient is cognitively is declining but is redirectable.  Psychiatric History: see h&P  Family History:  Family History  Problem Relation Age of Onset   Colon cancer Neg Hx    Colon polyps Neg Hx    Stomach cancer Neg Hx    Esophageal cancer Neg Hx    Social History:  Social History   Substance and Sexual Activity  Alcohol Use No     Social History   Substance  and Sexual Activity  Drug Use No    Social History   Socioeconomic History   Marital status: Single    Spouse name: Not on file   Number of children: Not on file   Years of education: Not on file   Highest education level: Not on file  Occupational History   Not on file  Tobacco Use   Smoking status: Former    Types: Cigarettes   Smokeless tobacco: Never  Vaping Use   Vaping status: Never Used  Substance and Sexual Activity   Alcohol use: No   Drug use: No   Sexual activity: Not on file  Other Topics Concern   Not on file  Social History Narrative   Not on file   Social Drivers of Health   Financial Resource Strain: Not on file  Food Insecurity: No Food Insecurity (03/27/2024)   Hunger Vital Sign    Worried About Running Out of Food in the Last Year: Never true    Ran Out of Food in the Last Year: Never true  Transportation Needs: No Transportation Needs (03/27/2024)   PRAPARE - Administrator, Civil Service (Medical): No    Lack of Transportation (Non-Medical): No  Physical Activity: Not on file  Stress: Not on file  Social Connections: Moderately Integrated (03/27/2024)   Social Connection and Isolation Panel    Frequency  of Communication with Friends and Family: Twice a week    Frequency of Social Gatherings with Friends and Family: Once a week    Attends Religious Services: 1 to 4 times per year    Active Member of [employer] or Organizations: No    Attends Engineer, Structural: 1 to 4 times per year    Marital Status: Divorced   Past Medical History:  Past Medical History:  Diagnosis Date   Allergy    Arthritis    back   Prostate cancer (HCC) dx'd 2010   surg only    Past Surgical History:  Procedure Laterality Date   COLONOSCOPY     EYE SURGERY     PROSTATE SURGERY      Current Medications: Current Facility-Administered Medications  Medication Dose Route Frequency Provider Last Rate Last Admin   acetaminophen  (TYLENOL ) tablet 650 mg   650 mg Oral Q6H PRN Coleman, Carolyn H, NP       alum & mag hydroxide-simeth (MAALOX/MYLANTA) 200-200-20 MG/5ML suspension 30 mL  30 mL Oral Q4H PRN Coleman, Carolyn H, NP       docusate sodium  (COLACE) capsule 100 mg  100 mg Oral Daily Jansen Goodpasture, MD   100 mg at 07/29/24 9167   fluPHENAZine  (PROLIXIN ) tablet 10 mg  10 mg Oral BID Shrivastava, Aryendra, MD   10 mg at 07/29/24 9166   magnesium  hydroxide (MILK OF MAGNESIA) suspension 30 mL  30 mL Oral Daily PRN Mardy Elveria DEL, NP       OLANZapine  (ZYPREXA ) injection 5 mg  5 mg Intramuscular TID PRN Mardy Elveria DEL, NP   5 mg at 04/12/24 2230   OLANZapine  zydis (ZYPREXA ) disintegrating tablet 5 mg  5 mg Oral TID PRN Coleman, Carolyn H, NP   5 mg at 07/22/24 0034   polyethylene glycol (MIRALAX  / GLYCOLAX ) packet 17 g  17 g Oral Daily PRN Donnelly Mellow, MD   17 g at 06/04/24 2123    Lab Results:  No results found for this or any previous visit (from the past 48 hours).      Blood Alcohol level:  Lab Results  Component Value Date   [name] <15 03/26/2024    Metabolic Disorder Labs: Lab Results  Component Value Date   HGBA1C 5.6 04/02/2024   MPG 114 04/02/2024   No results found for: PROLACTIN Lab Results  Component Value Date   CHOL 146 04/02/2024   TRIG 37 04/02/2024   HDL 43 04/02/2024   CHOLHDL 3.4 04/02/2024   VLDL 7 04/02/2024   LDLCALC 96 04/02/2024    Physical Findings: AIMS:  , ,  ,  ,    CIWA:    COWS:      Psychiatric Specialty Exam:  Presentation  General Appearance:  Appropriate for Environment; Bizarre  Eye Contact: Fair  Speech: Normal rate  Speech Volume: Normal    Mood and Affect  Mood: Fine Affect: Congruent   Thought Process  Thought Processes: Disorganized At baseline Descriptions of Associations:Intact  Orientation:Full (Time, Place and Person)  Thought Content: Impoverished Hallucinations: Denies  Ideas of Reference: denies Suicidal Thoughts:  Denies  Homicidal Thoughts: Denies   Sensorium  Memory: impiared  Judgment: Impaired  Insight: Community Education Officer  Concentration: Fair  Attention Span: Fair  Recall: [employer] of Knowledge: Fair  Language: Fair   Psychomotor Activity  Psychomotor Activity: No data recorded  Musculoskeletal: Strength & Muscle Tone: within normal limits Gait & Station: normal Assets  Assets: Manufacturing Systems Engineer; Resilience    Physical Exam: Physical Exam Vitals and nursing note reviewed.    ROS Blood pressure 130/61, pulse 65, temperature 97.8 F (36.6 C), resp. rate 16, height 6' (1.829 m), weight 100 kg, SpO2 98%. Body mass index is 29.91 kg/m.  Diagnosis: Principal Problem:   Delusional disorder (HCC) Active Problems:   Laceration of left eyebrow   Major neurocognitive disorder [facility])  Major Neurocognitive disorder  Clinical Decision Making: Patient currently admitted after jumping off a two-story building in the context of possible delusions as reported as the people in the house he lived for 4 years trying to kill him.  Patient needs to be monitored closely for ongoing psychosis and paranoid delusions.  Given patient's confusion and chronic paranoia, no family support, APS has taken up guardianship and looking for placement     Safety and Monitoring:             -- InVoluntary admission to inpatient psychiatric unit for safety, stabilization and treatment             -- Daily contact with patient to assess and evaluate symptoms and progress in treatment             -- Patient's case to be discussed in multi-disciplinary team meeting             -- Observation Level: q15 minute checks             -- Vital signs:  q12 hours             -- Precautions: suicide, elopement, and assault   2. Psychiatric Diagnoses and Treatment:              Continue Prolixin  to 10 mg twice daily.  -- The risks/benefits/side-effects/alternatives to this medication  were discussed in detail with the patient and time was given for questions. The patient consents to medication trial.                -- Metabolic profile and EKG monitoring obtained while on an atypical antipsychotic (BMI: Lipid Panel: HbgA1c: QTc:)              -- Encouraged patient to participate in unit milieu and in scheduled group therapies                            3. Medical Issues Being Addressed:    Fall on/8/25, CT head normal  4. Discharge Planning:   -- Social work and case management to assist with discharge planning and identification of hospital follow-up needs prior to discharge  -- Estimated LOS: 3-4 days  Allyn Foil, MD 07/29/2024, 12:21 PM

## 2024-07-30 NOTE — BH IP Treatment Plan (Signed)
 Interdisciplinary Treatment and Diagnostic Plan Update  07/30/2024 Time of Session: 1:00 PM  Benjamin [name] MRN: [id number]  Principal Diagnosis: Delusional disorder [facility])  Secondary Diagnoses: Principal Problem:   Delusional disorder (HCC) Active Problems:   Laceration of left eyebrow   Major neurocognitive disorder (HCC)   Current Medications:  Current Facility-Administered Medications  Medication Dose Route Frequency Provider Last Rate Last Admin   acetaminophen  (TYLENOL ) tablet 650 mg  650 mg Oral Q6H PRN Coleman, Carolyn H, NP       alum & mag hydroxide-simeth (MAALOX/MYLANTA) 200-200-20 MG/5ML suspension 30 mL  30 mL Oral Q4H PRN Coleman, Carolyn H, NP       docusate sodium  (COLACE) capsule 100 mg  100 mg Oral Daily Jadapalle, Sree, MD   100 mg at 07/30/24 9143   fluPHENAZine  (PROLIXIN ) tablet 10 mg  10 mg Oral BID Shrivastava, Aryendra, MD   10 mg at 07/30/24 9143   magnesium  hydroxide (MILK OF MAGNESIA) suspension 30 mL  30 mL Oral Daily PRN Coleman, Carolyn H, NP       OLANZapine  (ZYPREXA ) injection 5 mg  5 mg Intramuscular TID PRN Mardy Elveria DEL, NP   5 mg at 04/12/24 2230   OLANZapine  zydis (ZYPREXA ) disintegrating tablet 5 mg  5 mg Oral TID PRN Coleman, Carolyn H, NP   5 mg at 07/22/24 0034   polyethylene glycol (MIRALAX  / GLYCOLAX ) packet 17 g  17 g Oral Daily PRN Donnelly Mellow, MD   17 g at 06/04/24 2123   PTA Medications: Medications Prior to Admission  Medication Sig Dispense Refill Last Dose/Taking   docusate sodium  (COLACE) 100 MG capsule Take 100 mg by mouth daily.      omega-3 acid ethyl esters (LOVAZA) 1 g capsule Take 1 g by mouth daily.       Patient Stressors: Traumatic event    Patient Strengths: Communication skills   Treatment Modalities: Medication Management, Group therapy, Case management,  1 to 1 session with clinician, Psychoeducation, Recreational therapy.   Physician Treatment Plan for Primary Diagnosis: Delusional disorder [facility]) Long  Term Goal(s): Improvement in symptoms so as ready for discharge   Short Term Goals: Ability to identify changes in lifestyle to reduce recurrence of condition will improve Ability to verbalize feelings will improve Ability to disclose and discuss suicidal ideas Ability to demonstrate self-control will improve Ability to identify and develop effective coping behaviors will improve  Medication Management: Evaluate patient's response, side effects, and tolerance of medication regimen.  Therapeutic Interventions: 1 to 1 sessions, Unit Group sessions and Medication administration.  Evaluation of Outcomes: Progressing  Physician Treatment Plan for Secondary Diagnosis: Principal Problem:   Delusional disorder (HCC) Active Problems:   Laceration of left eyebrow   Major neurocognitive disorder (HCC)  Long Term Goal(s): Improvement in symptoms so as ready for discharge   Short Term Goals: Ability to identify changes in lifestyle to reduce recurrence of condition will improve Ability to verbalize feelings will improve Ability to disclose and discuss suicidal ideas Ability to demonstrate self-control will improve Ability to identify and develop effective coping behaviors will improve     Medication Management: Evaluate patient's response, side effects, and tolerance of medication regimen.  Therapeutic Interventions: 1 to 1 sessions, Unit Group sessions and Medication administration.  Evaluation of Outcomes: Progressing   RN Treatment Plan for Primary Diagnosis: Delusional disorder (HCC) Long Term Goal(s): Knowledge of disease and therapeutic regimen to maintain health will improve  Short Term Goals: Ability to remain free from injury  will improve, Ability to verbalize frustration and anger appropriately will improve, Ability to demonstrate self-control, Ability to participate in decision making will improve, Ability to verbalize feelings will improve, Ability to disclose and discuss  suicidal ideas, Ability to identify and develop effective coping behaviors will improve, and Compliance with prescribed medications will improve  Medication Management: RN will administer medications as ordered by provider, will assess and evaluate patient's response and provide education to patient for prescribed medication. RN will report any adverse and/or side effects to prescribing provider.  Therapeutic Interventions: 1 on 1 counseling sessions, Psychoeducation, Medication administration, Evaluate responses to treatment, Monitor vital signs and CBGs as ordered, Perform/monitor CIWA, COWS, AIMS and Fall Risk screenings as ordered, Perform wound care treatments as ordered.  Evaluation of Outcomes: Progressing   LCSW Treatment Plan for Primary Diagnosis: Delusional disorder [facility]) Long Term Goal(s): Safe transition to appropriate next level of care at discharge, Engage patient in therapeutic group addressing interpersonal concerns.  Short Term Goals: Engage patient in aftercare planning with referrals and resources, Increase social support, Increase ability to appropriately verbalize feelings, Increase emotional regulation, Facilitate acceptance of mental health diagnosis and concerns, Facilitate patient progression through stages of change regarding substance use diagnoses and concerns, Identify triggers associated with mental health/substance abuse issues, and Increase skills for wellness and recovery  Therapeutic Interventions: Assess for all discharge needs, 1 to 1 time with Social worker, Explore available resources and support systems, Assess for adequacy in community support network, Educate family and significant other(s) on suicide prevention, Complete Psychosocial Assessment, Interpersonal group therapy.  Evaluation of Outcomes: Progressing   Progress in Treatment: Attending groups: No. Participating in groups: No. Taking medication as prescribed: Yes. Toleration medication:  Yes. Family/Significant other contact made: Yes, individual(s) contacted:   [name], borther, [id number] Patient understands diagnosis: No. Discussing patient identified problems/goals with staff: Yes. Medical problems stabilized or resolved: Yes. Denies suicidal/homicidal ideation: Yes. Issues/concerns per patient self-inventory: No. Other: None   New problem(s) identified: No, Describe:  05/14/24 Update: None  05/24/24 Update: No changes at this time.  Update 05/29/2024:  No changes at this time.  Update 06/03/2024:  No changes at this time. Update 06/08/24: No changes at this time Update 06/13/24: No changes at this time  Update 06/18/24: No changes at this time Update 06/23/24: No changes at this time  Update 06/28/24: No changes at this time Update 07/03/24: No changes at this time Update 07/09/24: No changes at this time Update 07/14/24: No changes at this time  Update 07/19/24: No changes at this time Update 07/25/24: No changes at this time Update 07/30/24: No changes at this time             New Short Term/Long Term Goal(s): elimination of symptoms of psychosis, medication management for mood stabilization; elimination of SI thoughts; development of comprehensive mental wellness plan. Update 04/02/24: No changes at this time. Update 04/07/24: No changes at this time. Update 04/12/24: No changes at this time Update 04/18/24: No changes at this time  Update 04/23/24: No changes at this time. Update 04/28/2024: No changes at this time.  Update 05/04/2024: No changes at this time.  Update 05/09/2024:  No changes at this time. 05/14/24 Update: No changes at this time. 05/19/24 Update: No changes at this time. 05/24/24 Update: No changes at this time. Update 05/29/2024:  No changes at this time. Update 06/03/2024:  No changes at this time. Update 06/08/24: No changes at this time Update 06/13/24: No changes at this time  Update 06/18/24: No changes at this time Update 06/23/24: No changes at this time  Update 06/28/24: No changes at this time  Update 07/03/24: No changes at this time Update 07/09/24: No changes at this time Update 07/14/24: No changes at this time Update 07/19/24: No changes at this time Update 07/25/24: No changes at this time Update 07/30/24: No changes at this time       Patient Goals:  Get out of here, I want to get healed up so I can get out of here Update 04/02/24: No changes at this time. Update 04/07/24: No changes at this time .Update 04/12/24: No changes at this time Update 04/18/24: No changes at this time  Update 04/23/24: No changes at this time. Update 04/28/2024: No changes at this time. Update 05/04/2024: No changes at this time.   Update 05/29/2024:  No changes at this time.  Update 06/03/2024:  No changes at this time. Update 06/08/24: No changes at this time Update 06/13/24: No changes at this time Update 06/18/24: No changes at this time Update 06/23/24: No changes at this time Update 06/28/24: No changes at this time  Update 07/03/24: No changes at this time Update 07/09/24: No changes at this time  Update 07/14/24: No changes at this time Update 07/19/24: No changes at this time Update 07/25/24: No changes at this time Update 07/30/24: No changes at this time       Discharge Plan or Barriers: CSW will assist with appropriate discharge planning  Update 04/02/24: No changes at this time. Update 04/07/24: No changes at this time.Update 04/12/24: No changes at this time Update 04/18/24: CSW submitted report to APS. Care team looking at guardianship for pt at this time  Update 04/23/24: No changes at this time. Update 04/28/2024: No changes at this time. Update 05/04/2024: No changes at this time.  Update 05/09/2024:  [facility] APS continues to search for placement. 05/14/24 Update: [facility] APS continues to look for placement. CSW to continue to assess. 05/19/24 Update: No changes at this time. 05/24/24 Update:D[facility] continues to look for placement at this time Update 05/29/2024:   Patient remains on the unit and safe at this time.  Case was accepted by APS [facility] and they are looking for placement for the patient.   Update 06/03/2024:  No changes at this time. Update 06/08/24: No changes at this time Update 06/13/24: [facility] APS having difficulty connecting with pt's care coordinator at [facility] to assist with placement Update 06/18/24: No changes at this time Update 06/23/24: Arch reports that she may have found placement for pt. CSW will send TB tests per her request Update 06/28/24: Arch reports pt currently awaiting approval of 1915i. Reports she should know more about placement next week.  Update 07/03/24: No changes at this time Update 07/09/24: Pt still pending placement according to APS. Pt's 1915i is also still pending according to APS. Update 07/14/24: No changes at this time Update 07/19/24: No changes at this time Update 07/25/24: No changes at this time Update 07/30/24: According to APS, 1915i has been approved and they will move forward with placement. APS also reports pt's CCA is pending through [employer] and that should be completed today as well should current placement option fall through.      Reason for Continuation of Hospitalization: Delusions  Medication stabilization   Estimated Length of Stay: 1 to 7 days Update 04/02/24: TBD. Update 04/07/24: TBD Update 04/12/24:TBD. Update 04/28/24:TBD Update 05/04/2024: TBD Update 05/09/2024:  TBD  05/14/24 Update: TBD. 05/19/24 Update: TBD. 05/24/24 Update: TBD  Update 05/29/2024:  TBD.  Update 06/03/2024:  TBD Update 06/08/24: TBD Update 06/13/24: TBD Update 06/18/24: TBD Update 06/23/24: TBD Update 06/28/24: TBD Update 07/03/24: TBD Update 07/09/24: TBD  Update 07/14/24: TBD Update 07/19/24: TBD Update 07/25/24: TBD Update 07/30/24: TBD    Last 3 [place] Suicide Severity Risk Score: Flowsheet Row Admission (Current) from 03/27/2024 in [facility] BEHAV[facility]AL [facility] ED from 03/26/2024 in [facility]  Emergency Department at [facility] ED from 03/25/2024 in [facility]  C-SSRS RISK CATEGORY No Risk No Risk No Risk    Last [facility] 2/9 Scores:     No data to display          Scribe for Treatment Team: Lum JONETTA Raynaldo ISRAEL 07/30/2024 2:53 PM
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
## 2024-07-30 NOTE — Progress Notes (Addendum)
 Patient denies SI, HI, and AVH. Took all scheduled medications without issues. Patient observed in the dayroom for most of the day, intermittently sleeping and engaging in minimal conversation with peers or staff. No behavioral issues noted this shift. Safety maintained.   07/30/24 1200  Psych Admission Type (Psych Patients Only)  Admission Status Involuntary  Psychosocial Assessment  Patient Complaints None  Eye Contact Brief  Facial Expression Flat  Affect Preoccupied  Speech Soft  Interaction Minimal  Motor Activity Slow  Appearance/Hygiene In scrubs  Behavior Characteristics Appropriate to situation  Mood Pleasant  Thought Process  Coherency WDL  Content WDL  Delusions None reported or observed  Perception WDL  Hallucination None reported or observed  Judgment Impaired  Confusion Mild  Danger to Self  Current suicidal ideation? Denies

## 2024-07-30 NOTE — Progress Notes (Signed)
   07/30/24 2100  Psych Admission Type (Psych Patients Only)  Admission Status Involuntary  Psychosocial Assessment  Patient Complaints None  Eye Contact Brief  Facial Expression Flat  Affect Preoccupied  Speech Soft  Interaction Minimal  Motor Activity Slow  Appearance/Hygiene In scrubs  Behavior Characteristics Appropriate to situation;Cooperative  Mood Pleasant  Thought Process  Coherency WDL  Content WDL  Delusions None reported or observed  Perception WDL  Hallucination None reported or observed  Judgment Impaired  Confusion Mild  Danger to Self  Current suicidal ideation? Denies  Self-Injurious Behavior No self-injurious ideation or behavior indicators observed or expressed   Agreement Not to Harm Self Yes  Description of Agreement verbal  Danger to Others  Danger to Others None reported or observed   Mood/Behavior:  Intermittently confused. Pleasant and cooperative.    Psych assessment: Denies SI/HI and AVH.     Interaction / Group attendance:  Present in the milieu. Minimal interaction with peers and staff.  Attended group.   Medication/ PRNs: Compliant with scheduled medications. No prns required.    Pain: Denies   15 min checks in place for safety.

## 2024-07-30 NOTE — Plan of Care (Signed)
  Problem: Education: Goal: Knowledge of General Education information will improve Description: Including pain rating scale, medication(s)/side effects and non-pharmacologic comfort measures Outcome: Progressing   Problem: Health Behavior/Discharge Planning: Goal: Ability to manage health-related needs will improve Outcome: Progressing   Problem: Clinical Measurements: Goal: Ability to maintain clinical measurements within normal limits will improve Outcome: Progressing Goal: Will remain free from infection Outcome: Progressing Goal: Diagnostic test results will improve Outcome: Progressing Goal: Respiratory complications will improve Outcome: Progressing Goal: Cardiovascular complication will be avoided Outcome: Progressing   Problem: Activity: Goal: Risk for activity intolerance will decrease Outcome: Progressing   Problem: Nutrition: Goal: Adequate nutrition will be maintained Outcome: Progressing   Problem: Coping: Goal: Level of anxiety will decrease Outcome: Progressing   Problem: Elimination: Goal: Will not experience complications related to bowel motility Outcome: Progressing Goal: Will not experience complications related to urinary retention Outcome: Progressing   Problem: Pain Managment: Goal: General experience of comfort will improve and/or be controlled Outcome: Progressing   Problem: Safety: Goal: Ability to remain free from injury will improve Outcome: Progressing   Problem: Skin Integrity: Goal: Risk for impaired skin integrity will decrease Outcome: Progressing   Problem: Education: Goal: Knowledge of [place] [facility] Education information/materials will improve Outcome: Progressing Goal: Emotional status will improve Outcome: Progressing Goal: Mental status will improve Outcome: Progressing Goal: Verbalization of understanding the information provided will improve Outcome: Progressing   Problem: Activity: Goal: Interest or  engagement in activities will improve Outcome: Progressing Goal: Sleeping patterns will improve Outcome: Progressing   Problem: Coping: Goal: Ability to verbalize frustrations and anger appropriately will improve Outcome: Progressing Goal: Ability to demonstrate self-control will improve Outcome: Progressing   Problem: Health Behavior/Discharge Planning: Goal: Identification of resources available to assist in meeting health care needs will improve Outcome: Progressing Goal: Compliance with treatment plan for underlying cause of condition will improve Outcome: Progressing   Problem: Physical Regulation: Goal: Ability to maintain clinical measurements within normal limits will improve Outcome: Progressing   Problem: Safety: Goal: Periods of time without injury will increase Outcome: Progressing

## 2024-07-30 NOTE — Group Note (Signed)
 Date:  07/30/2024 Time:  10:51 AM  Group Topic/Focus:  Goals/ Christmas Bingo  Group:   The focus of this group is to help patients establish daily goals to achieve during treatment and discuss how the patient can incorporate goal setting into their daily lives to aide in recovery. Patients also played 2 rounds of bingo and won cytogeneticist.     Participation Level:  Minimal  [name] 07/30/2024, 10:51 AM

## 2024-07-30 NOTE — Group Note (Signed)
 Recreation Therapy Group Note   Group Topic:Other  Group Date: 07/30/2024 Start Time: 1415 End Time: 1500 Facilitators: Celestia Jeoffrey BRAVO, LRT, CTRS Location: Dayroom  Activity Description/Intervention: Therapeutic Drumming. Patients with peers and staff were given the opportunity to engage in a leader facilitated [facility] Group Empowerment Drumming Circle with staff from the [employer], in partnership with [employer]. Teaching laboratory technician and trained [place], Norleen Mon leading with LRT observing and documenting intervention and pt response. This evidenced-based practice targets 7 areas of health and wellbeing in the human experience including: stress-reduction, exercise, self-expression, camaraderie/support, nurturing, spirituality, and music-making (leisure).    Goal Area(s) Addresses:  Patient will engage in pro-social way in music group.  Patient will follow directions of drum leader on the first prompt. Patient will demonstrate no behavioral issues during group.  Patient will identify if a reduction in stress level occurs as a result of participation in therapeutic drum circle.   Affect/Mood: N/A   Participation Level: Did not attend    Clinical Observations/Individualized Feedback: Patient did not attend group.   Plan: Continue to engage patient in RT group sessions 2-3x/week.   Jeoffrey BRAVO Celestia, LRT, CTRS 07/30/2024 5:06 PM

## 2024-07-30 NOTE — Group Note (Signed)
 Date:  07/30/2024 Time:  9:43 PM  Group Topic/Focus:  Wrap-Up Group:   The focus of this group is to help patients review their daily goal of treatment and discuss progress on daily workbooks.    Participation Level:  Did Not Attend  Participation Quality:     Affect:     Cognitive:     Insight: None  Engagement in Group:  None  Modes of Intervention:     Additional Comments:    [name] 07/30/2024, 9:43 PM

## 2024-07-30 NOTE — Group Note (Signed)
 Date:  07/30/2024 Time:  4:43 PM  Group Topic/Focus:  Making Healthy Choices:   The focus of this group is to help patients identify negative/unhealthy choices they were using prior to admission and identify positive/healthier coping strategies to replace them upon discharge.    Participation Level:  Did Not Attend   Benjamin [name] 07/30/2024, 4:43 PM

## 2024-07-30 NOTE — Plan of Care (Signed)
   Problem: Health Behavior/Discharge Planning: Goal: Ability to manage health-related needs will improve Outcome: Progressing

## 2024-07-31 MED ORDER — TUBERCULIN PPD 5 UNIT/0.1ML ID SOLN
5.0000 [IU] | Freq: Once | INTRADERMAL | Status: AC
  Administered 2024-07-31: 5 [IU] via INTRADERMAL
  Filled 2024-07-31: qty 0.1

## 2024-07-31 NOTE — Group Note (Signed)
 Recreation Therapy Group Note   Group Topic:Communication  Group Date: 07/31/2024 Start Time: 1415 End Time: 1500 Facilitators: Celestia Jeoffrey BRAVO, LRT, CTRS Location: Dayroom  Group Description: Name That Food. Pt will randomly select 1 cut paper from laminated stack of popular food images from the 1970's-1990's from LRT. Without showing the group the image they selected, pt will use descriptive words to explain what food they selected. Once the image is correctly guessed, LRT and pts will reminisce and discuss past fond memories associated with the food as well as the importance of communication.  Goal Area(s) Addressed: Patient will increase communication skills.  Patient will increase frustration tolerance skills. Patient will reminisce a fond memory in their life.     Affect/Mood: N/A   Participation Level: Did not attend    Clinical Observations/Individualized Feedback: Patient did not attend group.   Plan: Continue to engage patient in RT group sessions 2-3x/week.   Jeoffrey BRAVO Celestia, LRT, CTRS 07/31/2024 4:29 PM

## 2024-07-31 NOTE — Group Note (Signed)
 Date:  07/31/2024 Time:  11:04 AM  Group Topic/Focus:  Building Self Esteem:   The Focus of this group is helping patients become aware of the effects of self-esteem on their lives, the things they and others do that enhance or undermine their self-esteem, seeing the relationship between their level of self-esteem and the choices they make and learning ways to enhance self-esteem.    Participation Level:  Active  Participation Quality:  Appropriate  Affect:  Appropriate  Cognitive:  Appropriate  Insight: Appropriate  Engagement in Group:  Engaged  Modes of Intervention:  Activity  Additional Comments:  N/A  [name] Gavel 07/31/2024, 11:04 AM

## 2024-07-31 NOTE — Progress Notes (Signed)
 Mobility Specialist Progress Note:    07/31/24 1530  Mobility  Activity Ambulated with assistance  Level of Assistance Standby assist, set-up cues, supervision of patient - no hands on  Assistive Device None  Distance Ambulated (ft) 200 ft  Range of Motion/Exercises Active;All extremities  Activity Response Tolerated well  Mobility visit 1 Mobility  Mobility Specialist Start Time (ACUTE ONLY) 1500  Mobility Specialist Stop Time (ACUTE ONLY) 1514  Mobility Specialist Time Calculation (min) (ACUTE ONLY) 14 min   Pt received in day room, pleasant and agreeable to mobility. Required SBA to stand and ambulate with no AD. Tolerated well, asx throughout. Returned pt to day room, all needs met.  Sherrilee Ditty Mobility Specialist Please contact via Special Educational Needs Teacher or  Rehab office at [phone]

## 2024-07-31 NOTE — Group Note (Signed)
 Date:  07/31/2024 Time:  9:23 PM  Group Topic/Focus:  Wrap-Up Group:   The focus of this group is to help patients review their daily goal of treatment and discuss progress on daily workbooks.    Participation Level:  Did Not Attend  Participation Quality:     Affect:     Cognitive:     Insight: None  Engagement in Group:  None  Modes of Intervention:     Additional Comments:    Benjamin [name] 07/31/2024, 9:23 PM

## 2024-07-31 NOTE — Progress Notes (Signed)
   07/31/24 1800  Psych Admission Type (Psych Patients Only)  Admission Status Involuntary  Psychosocial Assessment  Patient Complaints None  Eye Contact Brief  Facial Expression Flat  Affect Preoccupied  Speech Soft  Interaction Minimal  Motor Activity Slow  Appearance/Hygiene In scrubs  Behavior Characteristics Cooperative  Mood Pleasant  Thought Process  Coherency WDL  Content WDL  Delusions None reported or observed  Perception WDL  Hallucination None reported or observed  Judgment Impaired  Confusion Mild  Danger to Self  Current suicidal ideation? Denies  Self-Injurious Behavior No self-injurious ideation or behavior indicators observed or expressed   Agreement Not to Harm Self Yes  Description of Agreement Verbal  Danger to Others  Danger to Others None reported or observed   Pt. Refused to take the test for TB. Night nurse will offer again.
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
## 2024-07-31 NOTE — Group Note (Signed)
 [facility] LCSW Group Therapy Note   Group Date: 07/31/2024 Start Time: 1330 End Time: 1400   Type of Therapy/Topic:  Group Therapy:  Emotion Regulation  Participation Level:  None   Mood:  Description of Group:    The purpose of this group is to assist patients in learning to regulate negative emotions and experience positive emotions. Patients will be guided to discuss ways in which they have been vulnerable to their negative emotions. These vulnerabilities will be juxtaposed with experiences of positive emotions or situations, and patients challenged to use positive emotions to combat negative ones. Special emphasis will be placed on coping with negative emotions in conflict situations, and patients will process healthy conflict resolution skills.  Therapeutic Goals: Patient will identify two positive emotions or experiences to reflect on in order to balance out negative emotions:  Patient will label two or more emotions that they find the most difficult to experience:  Patient will be able to demonstrate positive conflict resolution skills through discussion or role plays:   Summary of Patient Progress:   X    Therapeutic Modalities:   Cognitive Behavioral Therapy Feelings Identification Dialectical Behavioral Therapy   Benjamin [name], LCSWA

## 2024-07-31 NOTE — Group Note (Signed)
 Date:  07/31/2024 Time:  4:24 PM  Group Topic/Focus:  Overcoming Stress:   The focus of this group is to define stress and help patients assess their triggers.    Participation Level:  Active  Participation Quality:  Appropriate  Affect:  Appropriate  Cognitive:  Appropriate  Insight: Appropriate  Engagement in Group:  Engaged  Modes of Intervention:  Discussion   Arland Nutting 07/31/2024, 4:24 PM

## 2024-07-31 NOTE — Plan of Care (Signed)
   Problem: Education: Goal: Emotional status will improve Outcome: Progressing

## 2024-08-01 ENCOUNTER — Inpatient Hospital Stay

## 2024-08-01 NOTE — Progress Notes (Signed)
°   08/01/24 1700  Psych Admission Type (Psych Patients Only)  Admission Status Involuntary  Psychosocial Assessment  Patient Complaints None  Eye Contact Brief  Facial Expression Flat  Affect Sullen  Speech Soft  Interaction Minimal  Motor Activity Slow  Appearance/Hygiene In scrubs  Behavior Characteristics Appropriate to situation  Mood Pleasant  Thought Process  Coherency WDL  Content WDL  Delusions None reported or observed  Perception WDL  Hallucination None reported or observed  Judgment Impaired  Confusion Mild  Danger to Self  Current suicidal ideation? Denies  Danger to Others  Danger to Others None reported or observed   Patient had a chest xray in preparation for discharge placement

## 2024-08-01 NOTE — Progress Notes (Addendum)
 Estimated Sleeping Duration (Last 24 Hours): 7.75-9.00 hours     07/31/24 2314  Psych Admission Type (Psych Patients Only)  Admission Status Involuntary  Psychosocial Assessment  Patient Complaints None  Eye Contact Brief  Facial Expression Flat  Affect Preoccupied  Speech Soft  Interaction Minimal  Motor Activity Slow  Appearance/Hygiene In scrubs  Behavior Characteristics Cooperative  Mood Pleasant  Thought Process  Coherency WDL  Content WDL  Delusions None reported or observed  Perception WDL  Hallucination None reported or observed  Judgment Impaired  Confusion Mild  Danger to Self  Current suicidal ideation? Denies  Description of Suicide Plan denies  Self-Injurious Behavior No self-injurious ideation or behavior indicators observed or expressed   Agreement Not to Harm Self Yes  Description of Agreement verbal  Danger to Others  Danger to Others None reported or observed

## 2024-08-01 NOTE — Group Note (Signed)
 Date:  08/01/2024 Time:  10:27 AM  Group Topic/Focus:  Morning Stretch with Norleen, Movement therapy    Participation Level:  Active  Participation Quality:  Appropriate  Affect:  Appropriate  Cognitive:  Appropriate  Insight: Appropriate  Engagement in Group:  Engaged  Modes of Intervention:  Activity  Additional Comments:  none  [name] 08/01/2024, 10:27 AM

## 2024-08-01 NOTE — Plan of Care (Signed)
 ?  Problem: Coping: ?Goal: Level of anxiety will decrease ?Outcome: Progressing ?  ?Problem: Safety: ?Goal: Ability to remain free from injury will improve ?Outcome: Progressing ?  ?

## 2024-08-01 NOTE — Plan of Care (Signed)
   Problem: Activity: Goal: Risk for activity intolerance will decrease Outcome: Progressing   Problem: Nutrition: Goal: Adequate nutrition will be maintained Outcome: Progressing

## 2024-08-01 NOTE — Group Note (Signed)
 Date:  08/01/2024 Time:  9:24 PM  Group Topic/Focus:  Self Care:   The focus of this group is to help patients understand the importance of self-care in order to improve or restore emotional, physical, spiritual, interpersonal, and financial health.    Participation Level:  Did Not Attend  Participation Quality:   Did Not Attend  Affect:   Did Not Attend  Cognitive:   Did Not Attend  Insight: None  Engagement in Group:  None  Modes of Intervention:   Did Not Attend  Additional Comments:    Laymon [name] 08/01/2024, 9:24 PM

## 2024-08-01 NOTE — Progress Notes (Signed)
 [facility] MD Progress Note  08/01/2024 3:26 PM Benjamin [name]  MRN:  [id number] Benjamin [name] is a 78 year old male who presents to the inpatient geriatric psych unit after jumping from a two story building. Patient was originally seen at [facility] who referred him to the ED who admitted him here on 03/27/2024. Patient reports that people from the [place] gang were trying to kill him and the only escape was through the bedroom window on the second floor. He currently lives in the house with [name] and his family and has been living with them for 3-4 years without any problems. On 03/19/2024 he went to the doctor and when he got back home the family wouldn't let him leave. He states that after he realized the gang was going to kill him he barricaded the door with his bed and jumped out the window. When he landed he ran away until one of his neighbors found him and called 911. Patient is admitted to [facility] unit with Q15 min safety monitoring. Multidisciplinary team approach is offered. Medication management; group/milieu therapy is offered.     Subjective: Chart is reviewed and discussed with the treatment team.  APS is working on legal guardianship and placement.  He has guardianship hearing June 19, 2024 Patient remains confused and minimally engaged in the groups but is noted to be sitting in the day area.  Patient denies SI/HI/plan.  He is taking his medications with no reported side effects. Psychiatric History: see h&P  Family History:  Family History  Problem Relation Age of Onset   Colon cancer Neg Hx    Colon polyps Neg Hx    Stomach cancer Neg Hx    Esophageal cancer Neg Hx    Social History:  Social History   Substance and Sexual Activity  Alcohol Use No     Social History   Substance and Sexual Activity  Drug Use No    Social History   Socioeconomic History   Marital status: Single    Spouse name: Not on file   Number of children: Not on file    Years of education: Not on file   Highest education level: Not on file  Occupational History   Not on file  Tobacco Use   Smoking status: Former    Types: Cigarettes   Smokeless tobacco: Never  Vaping Use   Vaping status: Never Used  Substance and Sexual Activity   Alcohol use: No   Drug use: No   Sexual activity: Not on file  Other Topics Concern   Not on file  Social History Narrative   Not on file   Social Drivers of Health   Financial Resource Strain: Not on file  Food Insecurity: No Food Insecurity (03/27/2024)   Hunger Vital Sign    Worried About Running Out of Food in the Last Year: Never true    Ran Out of Food in the Last Year: Never true  Transportation Needs: No Transportation Needs (03/27/2024)   PRAPARE - Administrator, Civil Service (Medical): No    Lack of Transportation (Non-Medical): No  Physical Activity: Not on file  Stress: Not on file  Social Connections: Moderately Integrated (03/27/2024)   Social Connection and Isolation Panel    Frequency of Communication with Friends and Family: Twice a week    Frequency of Social Gatherings with Friends and Family: Once a week    Attends Religious Services: 1 to 4 times per year  Active Member of Clubs or Organizations: No    Attends Banker Meetings: 1 to 4 times per year    Marital Status: Divorced   Past Medical History:  Past Medical History:  Diagnosis Date   Allergy    Arthritis    back   Prostate cancer (HCC) dx'd 2010   surg only    Past Surgical History:  Procedure Laterality Date   COLONOSCOPY     EYE SURGERY     PROSTATE SURGERY      Current Medications: Current Facility-Administered Medications  Medication Dose Route Frequency Provider Last Rate Last Admin   acetaminophen  (TYLENOL ) tablet 650 mg  650 mg Oral Q6H PRN Coleman, Carolyn H, NP       alum & mag hydroxide-simeth (MAALOX/MYLANTA) 200-200-20 MG/5ML suspension 30 mL  30 mL Oral Q4H PRN Coleman, Carolyn H, NP        docusate sodium  (COLACE) capsule 100 mg  100 mg Oral Daily Sokha Craker, MD   100 mg at 08/01/24 9062   fluPHENAZine  (PROLIXIN ) tablet 10 mg  10 mg Oral BID Shrivastava, Aryendra, MD   10 mg at 08/01/24 9062   magnesium  hydroxide (MILK OF MAGNESIA) suspension 30 mL  30 mL Oral Daily PRN Coleman, Carolyn H, NP       OLANZapine  (ZYPREXA ) injection 5 mg  5 mg Intramuscular TID PRN Mardy Elveria DEL, NP   5 mg at 04/12/24 2230   OLANZapine  zydis (ZYPREXA ) disintegrating tablet 5 mg  5 mg Oral TID PRN Coleman, Carolyn H, NP   5 mg at 07/22/24 0034   polyethylene glycol (MIRALAX  / GLYCOLAX ) packet 17 g  17 g Oral Daily PRN Donnelly Mellow, MD   17 g at 06/04/24 2123   tuberculin injection 5 Units  5 Units Intradermal Once Cerena Baine, MD   5 Units at 07/31/24 1739    Lab Results:  No results found for this or any previous visit (from the past 48 hours).      Blood Alcohol level:  Lab Results  Component Value Date   [name] <15 03/26/2024    Metabolic Disorder Labs: Lab Results  Component Value Date   HGBA1C 5.6 04/02/2024   MPG 114 04/02/2024   No results found for: PROLACTIN Lab Results  Component Value Date   CHOL 146 04/02/2024   TRIG 37 04/02/2024   HDL 43 04/02/2024   CHOLHDL 3.4 04/02/2024   VLDL 7 04/02/2024   LDLCALC 96 04/02/2024    Physical Findings: AIMS:  , ,  ,  ,    CIWA:    COWS:      Psychiatric Specialty Exam:  Presentation  General Appearance:  Appropriate for Environment; Bizarre  Eye Contact: Fair  Speech: Normal rate  Speech Volume: Normal    Mood and Affect  Mood: Fine Affect: Congruent   Thought Process  Thought Processes: Disorganized At baseline Descriptions of Associations:Intact  Orientation:Full (Time, Place and Person)  Thought Content: Impoverished Hallucinations: Denies  Ideas of Reference: denies Suicidal Thoughts: Denies  Homicidal Thoughts: Denies   Sensorium   Memory: impiared  Judgment: Impaired  Insight: Community Education Officer  Concentration: Fair  Attention Span: Fair  Recall: [employer] of Knowledge: Fair  Language: Fair   Psychomotor Activity  Psychomotor Activity: No data recorded  Musculoskeletal: Strength & Muscle Tone: within normal limits Gait & Station: normal Assets  Assets: Manufacturing Systems Engineer; Resilience    Physical Exam: Physical Exam Vitals and nursing note reviewed.  ROS Blood pressure 102/66, pulse 62, temperature 98.2 F (36.8 C), resp. rate 16, height 6' (1.829 m), weight 100 kg, SpO2 98%. Body mass index is 29.91 kg/m.  Diagnosis: Principal Problem:   Delusional disorder (HCC) Active Problems:   Laceration of left eyebrow   Major neurocognitive disorder [facility])  Major Neurocognitive disorder  Clinical Decision Making: Patient currently admitted after jumping off a two-story building in the context of possible delusions as reported as the people in the house he lived for 4 years trying to kill him.  Patient needs to be monitored closely for ongoing psychosis and paranoid delusions.  Given patient's confusion and chronic paranoia, no family support, APS has taken up guardianship and looking for placement     Safety and Monitoring:             -- InVoluntary admission to inpatient psychiatric unit for safety, stabilization and treatment             -- Daily contact with patient to assess and evaluate symptoms and progress in treatment             -- Patient's case to be discussed in multi-disciplinary team meeting             -- Observation Level: q15 minute checks             -- Vital signs:  q12 hours             -- Precautions: suicide, elopement, and assault   2. Psychiatric Diagnoses and Treatment:              Continue Prolixin  to 10 mg twice daily.  -- The risks/benefits/side-effects/alternatives to this medication were discussed in detail with the patient and time  was given for questions. The patient consents to medication trial.                -- Metabolic profile and EKG monitoring obtained while on an atypical antipsychotic (BMI: Lipid Panel: HbgA1c: QTc:)              -- Encouraged patient to participate in unit milieu and in scheduled group therapies                            3. Medical Issues Being Addressed:    Fall on/8/25, CT head normal  4. Discharge Planning:   -- Social work and case management to assist with discharge planning and identification of hospital follow-up needs prior to discharge  -- Estimated LOS: 3-4 days  Allyn Foil, MD 08/01/2024, 3:26 PM

## 2024-08-02 NOTE — BHH Counselor (Signed)
 Pt was visited by possible placement and APS social worker [name].   Arch reports she will reach out to pt should she need any notes for the placement.   Lum Croft, MSW, [place] 08/02/2024 10:39 AM

## 2024-08-02 NOTE — Progress Notes (Signed)
 [name] MD Progress Note  08/02/2024 11:00 PM Benjamin [name]  MRN:  [id number] Benjamin [name] is a 78 year old male who presents to the inpatient geriatric psych unit after jumping from a two story building. Patient was originally seen at [facility] who referred him to the ED who admitted him here on 03/27/2024. Patient reports that people from the [place] gang were trying to kill him and the only escape was through the bedroom window on the second floor. He currently lives in the house with [name] and his family and has been living with them for 3-4 years without any problems. On 03/19/2024 he went to the doctor and when he got back home the family wouldn't let him leave. He states that after he realized the gang was going to kill him he barricaded the door with his bed and jumped out the window. When he landed he ran away until one of his neighbors found him and called 911. Patient is admitted to [facility] unit with Q15 min safety monitoring. Multidisciplinary team approach is offered. Medication management; group/milieu therapy is offered.     Subjective: Chart is reviewed and discussed with the treatment team.  APS is working on legal guardianship and placement.  He has guardianship hearing June 19, 2024 Patient is noted to be sitting in the day area.  He remains confused but is able to handle the conversation.  He is able to recall that a staff member from the placement came by and patient expressed his desire to leave the hospital and go to the house.  He is agreeable to the placement option.  He is not endorsing SI/HI/plan and is not responding to internal stimuli.  He is taking his medications with no reported side effects. Psychiatric History: see h&P  Family History:  Family History  Problem Relation Age of Onset   Colon cancer Neg Hx    Colon polyps Neg Hx    Stomach cancer Neg Hx    Esophageal cancer Neg Hx    Social History:  Social History   Substance and  Sexual Activity  Alcohol Use No     Social History   Substance and Sexual Activity  Drug Use No    Social History   Socioeconomic History   Marital status: Single    Spouse name: Not on file   Number of children: Not on file   Years of education: Not on file   Highest education level: Not on file  Occupational History   Not on file  Tobacco Use   Smoking status: Former    Types: Cigarettes   Smokeless tobacco: Never  Vaping Use   Vaping status: Never Used  Substance and Sexual Activity   Alcohol use: No   Drug use: No   Sexual activity: Not on file  Other Topics Concern   Not on file  Social History Narrative   Not on file   Social Drivers of Health   Tobacco Use: Medium Risk (03/27/2024)   Patient History    Smoking Tobacco Use: Former    Smokeless Tobacco Use: Never    Passive Exposure: Not on Actuary Strain: Not on file  Food Insecurity: No Food Insecurity (03/27/2024)   Epic    Worried About Radiation Protection Practitioner of Food in the Last Year: Never true    Ran Out of Food in the Last Year: Never true  Transportation Needs: No Transportation Needs (03/27/2024)   Epic    Lack of Transportation (  Medical): No    Lack of Transportation (Non-Medical): No  Physical Activity: Not on file  Stress: Not on file  Social Connections: Moderately Integrated (03/27/2024)   Social Connection and Isolation Panel    Frequency of Communication with Friends and Family: Twice a week    Frequency of Social Gatherings with Friends and Family: Once a week    Attends Religious Services: 1 to 4 times per year    Active Member of [employer] or Organizations: No    Attends Banker Meetings: 1 to 4 times per year    Marital Status: Divorced  Depression (PHQ2-9): Not on file  Alcohol Screen: Low Risk (03/27/2024)   Alcohol Screen    Last Alcohol Screening Score (AUDIT): 0  Housing: Low Risk (03/27/2024)   Epic    Unable to Pay for Housing in the Last Year: No    Number of Times  Moved in the Last Year: 0    Homeless in the Last Year: No  Utilities: Not At Risk (03/27/2024)   Epic    Threatened with loss of utilities: No  Health Literacy: Not on file   Past Medical History:  Past Medical History:  Diagnosis Date   Allergy    Arthritis    back   Prostate cancer (HCC) dx'd 2010   surg only    Past Surgical History:  Procedure Laterality Date   COLONOSCOPY     EYE SURGERY     PROSTATE SURGERY      Current Medications: Current Facility-Administered Medications  Medication Dose Route Frequency Provider Last Rate Last Admin   acetaminophen  (TYLENOL ) tablet 650 mg  650 mg Oral Q6H PRN Coleman, Carolyn H, NP       alum & mag hydroxide-simeth (MAALOX/MYLANTA) 200-200-20 MG/5ML suspension 30 mL  30 mL Oral Q4H PRN Coleman, Carolyn H, NP       docusate sodium  (COLACE) capsule 100 mg  100 mg Oral Daily Elsie Sakuma, MD   100 mg at 08/02/24 9244   fluPHENAZine  (PROLIXIN ) tablet 10 mg  10 mg Oral BID Shrivastava, Aryendra, MD   10 mg at 08/02/24 2134   magnesium  hydroxide (MILK OF MAGNESIA) suspension 30 mL  30 mL Oral Daily PRN Mardy Elveria DEL, NP       OLANZapine  (ZYPREXA ) injection 5 mg  5 mg Intramuscular TID PRN Mardy Elveria DEL, NP   5 mg at 04/12/24 2230   OLANZapine  zydis (ZYPREXA ) disintegrating tablet 5 mg  5 mg Oral TID PRN Coleman, Carolyn H, NP   5 mg at 07/22/24 0034   polyethylene glycol (MIRALAX  / GLYCOLAX ) packet 17 g  17 g Oral Daily PRN Donnelly Mellow, MD   17 g at 06/04/24 2123    Lab Results:  No results found for this or any previous visit (from the past 48 hours).      Blood Alcohol level:  Lab Results  Component Value Date   [facility] <15 03/26/2024    Metabolic Disorder Labs: Lab Results  Component Value Date   HGBA1C 5.6 04/02/2024   MPG 114 04/02/2024   No results found for: PROLACTIN Lab Results  Component Value Date   CHOL 146 04/02/2024   TRIG 37 04/02/2024   HDL 43 04/02/2024   CHOLHDL 3.4 04/02/2024   VLDL 7  04/02/2024   LDLCALC 96 04/02/2024    Physical Findings: AIMS:  , ,  ,  ,    CIWA:    COWS:      Psychiatric Specialty  Exam:  Presentation  General Appearance:  Appropriate for Environment; Bizarre  Eye Contact: Fair  Speech: Normal rate  Speech Volume: Normal    Mood and Affect  Mood: Fine Affect: Congruent   Thought Process  Thought Processes: Disorganized At baseline Descriptions of Associations:Intact  Orientation:Full (Time, Place and Person)  Thought Content: Impoverished Hallucinations: Denies  Ideas of Reference: denies Suicidal Thoughts: Denies  Homicidal Thoughts: Denies   Sensorium  Memory: impiared  Judgment: Impaired  Insight: Community Education Officer  Concentration: Fair  Attention Span: Fair  Recall: [employer] of Knowledge: Fair  Language: Fair   Psychomotor Activity  Psychomotor Activity: No data recorded  Musculoskeletal: Strength & Muscle Tone: within normal limits Gait & Station: normal Assets  Assets: Manufacturing Systems Engineer; Resilience    Physical Exam: Physical Exam Vitals and nursing note reviewed.    ROS Blood pressure 130/71, pulse 84, temperature 98.1 F (36.7 C), resp. rate 18, height 6' (1.829 m), weight 100 kg, SpO2 98%. Body mass index is 29.91 kg/m.  Diagnosis: Principal Problem:   Delusional disorder (HCC) Active Problems:   Laceration of left eyebrow   Major neurocognitive disorder [facility])  Major Neurocognitive disorder  Clinical Decision Making: Patient currently admitted after jumping off a two-story building in the context of possible delusions as reported as the people in the house he lived for 4 years trying to kill him.  Patient needs to be monitored closely for ongoing psychosis and paranoid delusions.  Given patient's confusion and chronic paranoia, no family support, APS has taken up guardianship and looking for placement     Safety and Monitoring:              -- InVoluntary admission to inpatient psychiatric unit for safety, stabilization and treatment             -- Daily contact with patient to assess and evaluate symptoms and progress in treatment             -- Patient's case to be discussed in multi-disciplinary team meeting             -- Observation Level: q15 minute checks             -- Vital signs:  q12 hours             -- Precautions: suicide, elopement, and assault   2. Psychiatric Diagnoses and Treatment:              Continue Prolixin  to 10 mg twice daily.  -- The risks/benefits/side-effects/alternatives to this medication were discussed in detail with the patient and time was given for questions. The patient consents to medication trial.                -- Metabolic profile and EKG monitoring obtained while on an atypical antipsychotic (BMI: Lipid Panel: HbgA1c: QTc:)              -- Encouraged patient to participate in unit milieu and in scheduled group therapies                            3. Medical Issues Being Addressed:    Fall on/8/25, CT head normal  4. Discharge Planning:   -- Social work and case management to assist with discharge planning and identification of hospital follow-up needs prior to discharge  -- Estimated LOS: 3-4 days  Nikai Quest, MD 08/02/2024, 11:00 PM

## 2024-08-02 NOTE — Group Note (Signed)
 LCSW Group Therapy Note  Group Date: 08/02/2024 Start Time: 1330 End Time: 1400   Type of Therapy and Topic:  Group Therapy - Healthy vs Unhealthy Coping Skills  Participation Level:  None   Description of Group The focus of this group was to determine what unhealthy coping techniques typically are used by group members and what healthy coping techniques would be helpful in coping with various problems. Patients were guided in becoming aware of the differences between healthy and unhealthy coping techniques. Patients were asked to identify 2-3 healthy coping skills they would like to learn to use more effectively.  Therapeutic Goals Patients learned that coping is what human beings do all day long to deal with various situations in their lives Patients defined and discussed healthy vs unhealthy coping techniques Patients identified their preferred coping techniques and identified whether these were healthy or unhealthy Patients determined 2-3 healthy coping skills they would like to become more familiar with and use more often. Patients provided support and ideas to each other   Summary of Patient Progress:  Pt slept throughout group    Therapeutic Modalities Cognitive Behavioral Therapy Motivational Interviewing  Benjamin [name], [place] 08/02/2024  2:13 PM

## 2024-08-02 NOTE — Progress Notes (Signed)
°   08/01/24 2100  Psych Admission Type (Psych Patients Only)  Admission Status Involuntary  Psychosocial Assessment  Patient Complaints None  Eye Contact Brief  Facial Expression Flat  Affect Sullen  Speech Soft  Interaction Minimal  Motor Activity Slow  Appearance/Hygiene In scrubs  Behavior Characteristics Appropriate to situation;Cooperative  Mood Pleasant  Thought Process  Coherency WDL  Content WDL  Delusions None reported or observed  Perception WDL  Hallucination None reported or observed  Judgment Impaired  Confusion Mild  Danger to Self  Current suicidal ideation? Denies  Self-Injurious Behavior No self-injurious ideation or behavior indicators observed or expressed   Agreement Not to Harm Self Yes  Description of Agreement verbal  Danger to Others  Danger to Others None reported or observed   Mood/Behavior:  Pleasant and cooperative.    Psych assessment: Denies SI/HI and AVH.     Interaction / Group attendance:  Present in the milieu. Minimal interaction with peers and staff.  Attended group.   Medication/ PRNs: Compliant with scheduled medications. No prns required.   Pain: Denies   15 min checks in place for safety.

## 2024-08-02 NOTE — Plan of Care (Signed)
  Problem: Education: Goal: Knowledge of General Education information will improve Description: Including pain rating scale, medication(s)/side effects and non-pharmacologic comfort measures Outcome: Progressing   Problem: Health Behavior/Discharge Planning: Goal: Ability to manage health-related needs will improve Outcome: Progressing   Problem: Clinical Measurements: Goal: Ability to maintain clinical measurements within normal limits will improve Outcome: Progressing Goal: Will remain free from infection Outcome: Progressing Goal: Diagnostic test results will improve Outcome: Progressing Goal: Respiratory complications will improve Outcome: Progressing Goal: Cardiovascular complication will be avoided Outcome: Progressing   Problem: Activity: Goal: Risk for activity intolerance will decrease Outcome: Progressing   Problem: Nutrition: Goal: Adequate nutrition will be maintained Outcome: Progressing   Problem: Coping: Goal: Level of anxiety will decrease Outcome: Progressing   Problem: Elimination: Goal: Will not experience complications related to bowel motility Outcome: Progressing Goal: Will not experience complications related to urinary retention Outcome: Progressing   Problem: Pain Managment: Goal: General experience of comfort will improve and/or be controlled Outcome: Progressing   Problem: Safety: Goal: Ability to remain free from injury will improve Outcome: Progressing   Problem: Skin Integrity: Goal: Risk for impaired skin integrity will decrease Outcome: Progressing   Problem: Education: Goal: Knowledge of [place] [facility] Education information/materials will improve Outcome: Progressing Goal: Emotional status will improve Outcome: Progressing Goal: Mental status will improve Outcome: Progressing Goal: Verbalization of understanding the information provided will improve Outcome: Progressing   Problem: Activity: Goal: Interest or  engagement in activities will improve Outcome: Progressing Goal: Sleeping patterns will improve Outcome: Progressing   Problem: Coping: Goal: Ability to verbalize frustrations and anger appropriately will improve Outcome: Progressing Goal: Ability to demonstrate self-control will improve Outcome: Progressing   Problem: Health Behavior/Discharge Planning: Goal: Identification of resources available to assist in meeting health care needs will improve Outcome: Progressing Goal: Compliance with treatment plan for underlying cause of condition will improve Outcome: Progressing   Problem: Physical Regulation: Goal: Ability to maintain clinical measurements within normal limits will improve Outcome: Progressing   Problem: Safety: Goal: Periods of time without injury will increase Outcome: Progressing

## 2024-08-02 NOTE — Group Note (Signed)
 Recreation Therapy Group Note   Group Topic:Leisure Education  Group Date: 08/02/2024 Start Time: 1400 End Time: 1440 Facilitators: Celestia Jeoffrey BRAVO, LRT, CTRS Location: Courtyard  Group Description: Music. Patients are encouraged to name their favorite song(s) for LRT to play song through speaker for group to hear, while in the courtyard getting fresh air and sunlight. Patients educated on the definition of leisure and the importance of having different leisure interests outside of the hospital. Group discussed how leisure activities can often be used as pharmacologist and that listening to music and being outside are examples.    Goal Area(s) Addressed:  Patient will identify a current leisure interest.  Patient will practice making a positive decision. Patient will have the opportunity to try a new leisure activity.   Affect/Mood: N/A   Participation Level: Did not attend    Clinical Observations/Individualized Feedback: Patient did not attend group.   Plan: Continue to engage patient in RT group sessions 2-3x/week.   Jeoffrey BRAVO Celestia, LRT, CTRS 08/02/2024 4:35 PM

## 2024-08-02 NOTE — Plan of Care (Signed)
   Problem: Clinical Measurements: Goal: Will remain free from infection Outcome: Progressing Goal: Diagnostic test results will improve Outcome: Progressing

## 2024-08-02 NOTE — Group Note (Signed)
 Date:  08/02/2024 Time:  3:59 PM  Group Topic/Focus:     Music for gero psych (geriatric psychology/dementia care) focuses on familiar, personalized music from a person's memory bump to reduce agitation, improve mood, spark memories, and enhance communication, with genres like soft classical, jazz, hymns, and old favorites (e.g., Singing in the [place], [place], big band) being effective, alongside tailored music therapy techniques that match tempo and use simple melodies for calming effects.   Participation Level:  Did Not Attend   [name] Gavel 08/02/2024, 3:59 PM

## 2024-08-02 NOTE — Progress Notes (Signed)
°   08/02/24 1400  Psych Admission Type (Psych Patients Only)  Admission Status Involuntary  Psychosocial Assessment  Patient Complaints None  Eye Contact Brief  Facial Expression Flat  Affect Sullen  Speech Soft  Interaction Minimal  Motor Activity Slow  Appearance/Hygiene In scrubs  Behavior Characteristics Appropriate to situation  Mood Preoccupied  Thought Process  Coherency WDL  Content WDL  Delusions None reported or observed  Perception WDL  Hallucination None reported or observed  Judgment Impaired  Confusion Mild  Danger to Self  Current suicidal ideation? Denies  Danger to Others  Danger to Others None reported or observed   IMPRESSION: chest XRAY for tb clearance 1. Stable right lower lobe calcified granulomas. No evidence of active tuberculosis.

## 2024-08-02 NOTE — Group Note (Signed)
 Date:  08/02/2024 Time:  10:58 AM  Group Topic/Focus:  Self Care:   The focus of this group is to help patients understand the importance of self-care in order to improve or restore emotional, physical, spiritual, interpersonal, and financial health. We did chair yoga to stretch our body's and get moving.    Participation Level:  Did Not Attend  Leigh VEAR Pais 08/02/2024, 10:58 AM

## 2024-08-03 NOTE — Group Note (Signed)
 Date:  08/03/2024 Time:  3:30 PM  Group Topic/Focus:  House rules and expectations for the unit    Participation Level:  Did Not Attend    Benjamin [name] 08/03/2024, 3:30 PM

## 2024-08-03 NOTE — BHH Counselor (Signed)
 CSW contacted [name] regarding pt's placement.   Arch reports pt was accepted to placement.   Arch reports pt needs updated FL2 and 6 months of hospital notes.   CSW informed Arch she would have to go through medical records for the notes.   CSW sent [place] updated FL2, TB chest x-ray results and 7 days of progress notes from provider per her request.   Lum Croft, MSW, [facility] 08/03/2024 1:08 PM

## 2024-08-03 NOTE — Group Note (Signed)
 Date:  08/03/2024 Time:  8:46 PM  Group Topic/Focus:  Healthy Communication:   The focus of this group is to discuss communication, barriers to communication, as well as healthy ways to communicate with others.    Participation Level:  Active  Participation Quality:  Appropriate  Affect:  Appropriate  Cognitive:  Appropriate  Insight: Appropriate and Good  Engagement in Group:  Engaged  Modes of Intervention:  Discussion  Additional Comments:    Romero Earnie Hope 08/03/2024, 8:46 PM

## 2024-08-03 NOTE — Progress Notes (Signed)
°   08/03/24 0631  PPD Results  Does patient have an induration at the injection site? No  Induration(mm) 0 mm  Name of Physician Notified Jasapalle, Sree

## 2024-08-03 NOTE — Group Note (Signed)
 Physical/Occupational Therapy Group Note  Group Topic: UE Therex   Group Date: 08/03/2024 Start Time: 1300 End Time: 1345 Facilitators: Clive Warren CROME, OT   Group Description: Group instructed in series of upper extremities exercises, aimed to promote strength, flexibility, range of motion and functional endurance.  Patients provided cuing for proper mechanics and proper pace of exercise; exercises adjusted as necessary for individualized patient needs.  Patient also engaged in cognitive components throughout session, working to integrate attention to task, command following, turn-taking and appropriate social interaction throughout session.  Allowed to ask questions as appropriate, and encouraged to identify specific exercises that they could complete independently outside of group sessions.  Therapeutic Goal(s):  Demonstrate appropriate performance of upper extremity exercises to promote strength, flexibility, range of motion and functional endurance Identify 2-3 specific upper extremity exercises to complete as home exercise program outside of group session  Individual Participation: Pt participated with encouragement, sat on couch near the group. Required multimodal cues for >75% of the exercises for technique but able to demo fair carryover afterwards. Limited engagement in group discussion but with prompting would engage with therapist.   Participation Level: Moderate   Participation Quality: Moderate Cues   Behavior: Alert, Attentive , Calm, and Cooperative   Speech/Thought Process: Relevant   Affect/Mood: Flat   Insight: Poor   Judgement: Poor   Modes of Intervention: Activity, Clarification, Discussion, Education, Exploration, Problem-solving, Rapport Building, Socialization, and Support  Patient Response to Interventions:  Attentive, Engaged, Interested , and Receptive   Plan: Continue to engage patient in PT/OT groups 1 - 2x/week.  Lawton Dollinger R., MPH, MS, OTR/L ascom  [phone] 08/03/2024, 4:28 PM

## 2024-08-03 NOTE — Progress Notes (Signed)
°   08/03/24 1500  Psych Admission Type (Psych Patients Only)  Admission Status Involuntary  Psychosocial Assessment  Patient Complaints None  Eye Contact Brief  Facial Expression Flat  Affect Sullen  Speech Soft  Interaction Minimal  Motor Activity Slow  Appearance/Hygiene In scrubs  Behavior Characteristics Appropriate to situation  Mood Preoccupied  Thought Process  Coherency WDL  Content WDL  Delusions None reported or observed  Perception WDL  Hallucination None reported or observed  Judgment Impaired  Confusion Mild  Danger to Self  Current suicidal ideation? Denies  Danger to Others  Danger to Others None reported or observed   Discharge expected very soon as patient has been offered placement

## 2024-08-03 NOTE — [facility] Group Notes (Signed)
 Spirituality Group   Description: Participant directed exploration of values, beliefs and meaning   **Focus on Community & Connections: What does community look like to you? How do we know we are connecting?  What are barriers to connecting/relationship with others? Reflect and locate spiritual aspects (eg, purpose, joy, meaning, sense of self) of connection.   **Group named topics of acceptance, forgiveness, and openness to receiving (help, etc.)   Following a brief framework of chaplains role and ground rules of group behavior, participants are invited to share concerns or questions that engage spiritual life. Emphasis placed on common themes and shared experiences and ways to make meaning and clarify living into ones values.   Theory/Process/Goal: Utilize the theoretical framework of group therapy established by Celena Kite, Relational Cultural Theory and Rogerian approaches to facilitate relational empathy and use of the here and now to foster reflection, self-awareness, and sharing.   Observations: Cordie participated in latter 0.5 of group.  Tedric Leeth L. [name] HERO.Div

## 2024-08-03 NOTE — Plan of Care (Signed)
 Problem: Education: Goal: Knowledge of General Education information will improve Description: Including pain rating scale, medication(s)/side effects and non-pharmacologic comfort measures 08/03/2024 0406 by Geneva Steward CROME, RN Outcome: Progressing 08/03/2024 0406 by Geneva Steward CROME, RN Outcome: Progressing   Problem: Health Behavior/Discharge Planning: Goal: Ability to manage health-related needs will improve 08/03/2024 0406 by Geneva Steward CROME, RN Outcome: Progressing 08/03/2024 0406 by Geneva Steward CROME, RN Outcome: Progressing   Problem: Clinical Measurements: Goal: Ability to maintain clinical measurements within normal limits will improve 08/03/2024 0406 by Geneva Steward CROME, RN Outcome: Progressing 08/03/2024 0406 by Geneva Steward CROME, RN Outcome: Progressing Goal: Will remain free from infection 08/03/2024 0406 by Geneva Steward CROME, RN Outcome: Progressing 08/03/2024 0406 by Geneva Steward CROME, RN Outcome: Progressing Goal: Diagnostic test results will improve 08/03/2024 0406 by Geneva Steward CROME, RN Outcome: Progressing 08/03/2024 0406 by Geneva Steward CROME, RN Outcome: Progressing Goal: Respiratory complications will improve 08/03/2024 0406 by Geneva Steward CROME, RN Outcome: Progressing 08/03/2024 0406 by Geneva Steward CROME, RN Outcome: Progressing Goal: Cardiovascular complication will be avoided 08/03/2024 0406 by Geneva Steward CROME, RN Outcome: Progressing 08/03/2024 0406 by Geneva Steward CROME, RN Outcome: Progressing   Problem: Activity: Goal: Risk for activity intolerance will decrease 08/03/2024 0406 by Geneva Steward CROME, RN Outcome: Progressing 08/03/2024 0406 by Geneva Steward CROME, RN Outcome: Progressing   Problem: Nutrition: Goal: Adequate nutrition will be maintained 08/03/2024 0406 by Geneva Steward CROME, RN Outcome: Progressing 08/03/2024 0406 by Geneva Steward CROME, RN Outcome: Progressing   Problem: Coping: Goal: Level of anxiety will decrease 08/03/2024 0406 by Geneva Steward CROME, RN Outcome: Progressing 08/03/2024 0406 by Geneva Steward CROME, RN Outcome: Progressing   Problem: Elimination: Goal: Will not experience complications related to bowel motility 08/03/2024 0406 by Geneva Steward CROME, RN Outcome: Progressing 08/03/2024 0406 by Geneva Steward CROME, RN Outcome: Progressing Goal: Will not experience complications related to urinary retention 08/03/2024 0406 by Geneva Steward CROME, RN Outcome: Progressing 08/03/2024 0406 by Geneva Steward CROME, RN Outcome: Progressing   Problem: Pain Managment: Goal: General experience of comfort will improve and/or be controlled 08/03/2024 0406 by Geneva Steward CROME, RN Outcome: Progressing 08/03/2024 0406 by Geneva Steward CROME, RN Outcome: Progressing   Problem: Safety: Goal: Ability to remain free from injury will improve 08/03/2024 0406 by Geneva Steward CROME, RN Outcome: Progressing 08/03/2024 0406 by Geneva Steward CROME, RN Outcome: Progressing   Problem: Skin Integrity: Goal: Risk for impaired skin integrity will decrease 08/03/2024 0406 by Geneva Steward CROME, RN Outcome: Progressing 08/03/2024 0406 by Geneva Steward CROME, RN Outcome: Progressing   Problem: Education: Goal: Knowledge of [place] [facility] Education information/materials will improve 08/03/2024 0406 by Geneva Steward CROME, RN Outcome: Progressing 08/03/2024 0406 by Geneva Steward CROME, RN Outcome: Progressing Goal: Emotional status will improve 08/03/2024 0406 by Geneva Steward CROME, RN Outcome: Progressing 08/03/2024 0406 by Geneva Steward CROME, RN Outcome: Progressing Goal: Mental status will improve 08/03/2024 0406 by Geneva Steward CROME, RN Outcome: Progressing 08/03/2024 0406 by Geneva Steward CROME, RN Outcome: Progressing Goal: Verbalization of understanding the information provided will improve 08/03/2024 0406 by Geneva Steward CROME, RN Outcome: Progressing 08/03/2024 0406 by Geneva Steward CROME, RN Outcome: Progressing   Problem: Activity: Goal: Interest or engagement in  activities will improve 08/03/2024 0406 by Geneva Steward CROME, RN Outcome: Progressing 08/03/2024 0406 by Geneva Steward CROME, RN Outcome: Progressing Goal: Sleeping patterns will improve 08/03/2024 0406 by Geneva Steward CROME, RN Outcome: Progressing 08/03/2024 0406 by Geneva Steward CROME, RN Outcome: Progressing  Problem: Coping: Goal: Ability to verbalize frustrations and anger appropriately will improve 08/03/2024 0406 by Geneva Steward CROME, RN Outcome: Progressing 08/03/2024 0406 by Geneva Steward CROME, RN Outcome: Progressing Goal: Ability to demonstrate self-control will improve 08/03/2024 0406 by Geneva Steward CROME, RN Outcome: Progressing 08/03/2024 0406 by Geneva Steward CROME, RN Outcome: Progressing   Problem: Health Behavior/Discharge Planning: Goal: Identification of resources available to assist in meeting health care needs will improve 08/03/2024 0406 by Geneva Steward CROME, RN Outcome: Progressing 08/03/2024 0406 by Geneva Steward CROME, RN Outcome: Progressing Goal: Compliance with treatment plan for underlying cause of condition will improve 08/03/2024 0406 by Geneva Steward CROME, RN Outcome: Progressing 08/03/2024 0406 by Geneva Steward CROME, RN Outcome: Progressing   Problem: Physical Regulation: Goal: Ability to maintain clinical measurements within normal limits will improve 08/03/2024 0406 by Geneva Steward CROME, RN Outcome: Progressing 08/03/2024 0406 by Geneva Steward CROME, RN Outcome: Progressing   Problem: Safety: Goal: Periods of time without injury will increase 08/03/2024 0406 by Geneva Steward CROME, RN Outcome: Progressing 08/03/2024 0406 by Geneva Steward CROME, RN Outcome: Progressing

## 2024-08-03 NOTE — Group Note (Signed)
 Recreation Therapy Group Note   Group Topic:Leisure Education  Group Date: 08/03/2024 Start Time: 1405 End Time: 1500 Facilitators: Celestia Jeoffrey BRAVO, LRT, CTRS Location: Dayroom  Group Description: Bingo. Patients played multiple rounds of bingo. LRT and pts discussed the definition of leisure, things they do in their free time outside of the hospital, and how bingo is also a leisure activity. Pts received a coloring book, word search book, or journal as a prize.    Goal Area(s) Addressed:  Patient will identify a current leisure interest.  Patient will learn the definition of leisure. Patient will have the opportunity to try a new leisure activity. Patient will communicate with peers and LRT.   Affect/Mood: Flat   Participation Level: Minimal    Clinical Observations/Individualized Feedback: Benjamin [name] was present in the dayroom at the time of group. Pt attempted to play one game of bingo.   Plan: Continue to engage patient in RT group sessions 2-3x/week.   [address], LRT, CTRS 08/03/2024 5:02 PM

## 2024-08-03 NOTE — Group Note (Signed)
 Date:  08/03/2024 Time:  12:33 PM  Group Topic/Focus:  Identifying Needs:   The focus of this group is to help patients identify their personal needs that have been historically problematic and identify healthy behaviors to address their needs.    Participation Level:  Did Not Attend Arland Nutting 08/03/2024, 12:33 PM
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
## 2024-08-03 NOTE — NC FL2 (Signed)
°  [place]  MEDICAID FL2 LEVEL OF CARE FORM     IDENTIFICATION  Patient Name: Benjamin [name] Birthdate: September 23, 1945 Sex: male Admission Date (Current Location): 03/27/2024  [place] and [place] Number:  [place] [id number] [facility] Facility and Address:  [facility], [address], [place], [place] [address]      Provider Number: [id number]  Attending Physician Name and Address:  Donnelly Mellow, MD  Relative Name and Phone Number:  [name], APS Caseworker, [phone]    Current Level of Care: Hospital Recommended Level of Care: [facility], Other (Comment) (Group Home) Prior Approval Number:    Date Approved/Denied:   PASRR Number:    Discharge Plan: Other (Comment)    Current Diagnoses: Patient Active Problem List   Diagnosis Date Noted   Major neurocognitive disorder (HCC) 07/09/2024   Laceration of left eyebrow 07/01/2024   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   Psychosis (HCC) 03/26/2024    Orientation RESPIRATION BLADDER Height & Weight     Self  Normal Continent Weight: 220 lb 8 oz (100 kg) Height:  6' (182.9 cm)  BEHAVIORAL SYMPTOMS/MOOD NEUROLOGICAL BOWEL NUTRITION STATUS  Dangerous to self, others or property   Continent    AMBULATORY STATUS COMMUNICATION OF NEEDS Skin   Independent Verbally Normal                       Personal Care Assistance Level of Assistance              Functional Limitations Info  Speech     Speech Info: Adequate    SPECIAL CARE FACTORS FREQUENCY  Blood pressure Blood Pressure Frequency: 2x times daily                  Contractures Contractures Info: Not present    Additional Factors Info  Code Status Code Status Info: FULL Allergies Info: None Psychotropic Info: fluPHENAZine  (PROLIXIN ) tablet 10 mg         Current Medications (08/03/2024):  This is the current hospital active medication list Current Facility-Administered Medications  Medication Dose Route Frequency  Provider Last Rate Last Admin   acetaminophen  (TYLENOL ) tablet 650 mg  650 mg Oral Q6H PRN Coleman, Carolyn H, NP       alum & mag hydroxide-simeth (MAALOX/MYLANTA) 200-200-20 MG/5ML suspension 30 mL  30 mL Oral Q4H PRN Coleman, Carolyn H, NP       docusate sodium  (COLACE) capsule 100 mg  100 mg Oral Daily Jadapalle, Sree, MD   100 mg at 08/03/24 9191   fluPHENAZine  (PROLIXIN ) tablet 10 mg  10 mg Oral BID Shrivastava, Aryendra, MD   10 mg at 08/03/24 9191   magnesium  hydroxide (MILK OF MAGNESIA) suspension 30 mL  30 mL Oral Daily PRN Coleman, Carolyn H, NP       OLANZapine  (ZYPREXA ) injection 5 mg  5 mg Intramuscular TID PRN Mardy Elveria DEL, NP   5 mg at 04/12/24 2230   OLANZapine  zydis (ZYPREXA ) disintegrating tablet 5 mg  5 mg Oral TID PRN Coleman, Carolyn H, NP   5 mg at 07/22/24 0034   polyethylene glycol (MIRALAX  / GLYCOLAX ) packet 17 g  17 g Oral Daily PRN Donnelly Mellow, MD   17 g at 06/04/24 2123     Discharge Medications: Please see discharge summary for a list of discharge medications.  Relevant Imaging Results:  Relevant Lab Results:   Additional Information SSN# [id number]  [name], LCSWA

## 2024-08-03 NOTE — Plan of Care (Signed)
   Problem: Clinical Measurements: Goal: Ability to maintain clinical measurements within normal limits will improve Outcome: Progressing Goal: Will remain free from infection Outcome: Progressing

## 2024-08-03 NOTE — Progress Notes (Signed)
°   08/02/24 2000  Psych Admission Type (Psych Patients Only)  Admission Status Involuntary  Psychosocial Assessment  Patient Complaints None  Eye Contact Brief  Facial Expression Flat  Affect Sullen  Speech Soft  Interaction Minimal  Motor Activity Slow  Appearance/Hygiene In scrubs  Behavior Characteristics Appropriate to situation;Cooperative  Mood Preoccupied  Thought Process  Coherency WDL  Content WDL  Delusions None reported or observed  Perception WDL  Hallucination None reported or observed  Judgment Impaired  Confusion Mild  Danger to Self  Current suicidal ideation? Denies  Self-Injurious Behavior No self-injurious ideation or behavior indicators observed or expressed   Agreement Not to Harm Self Yes  Description of Agreement verbal  Danger to Others  Danger to Others None reported or observed   Mood/Behavior:  Pleasant and cooperative.    Psych assessment: Denies SI/HI and AVH.     Interaction / Group attendance:  Present in the milieu. Minimal interaction with peers and staff.  Did not attend group.   Medication/ PRNs: Compliant with scheduled medications. No prns required.   Pain: Denies   15 min checks in place for safety.

## 2024-08-04 DIAGNOSIS — F0390 Unspecified dementia without behavioral disturbance: Secondary | ICD-10-CM | POA: Diagnosis not present

## 2024-08-04 DIAGNOSIS — S01112A Laceration without foreign body of left eyelid and periocular area, initial encounter: Secondary | ICD-10-CM | POA: Diagnosis not present

## 2024-08-04 DIAGNOSIS — F22 Delusional disorders: Secondary | ICD-10-CM | POA: Diagnosis not present

## 2024-08-04 NOTE — Group Note (Signed)
 Date:  08/04/2024 Time:  4:14 PM  Group Topic/Focus:  Healthy Communication:   The focus of this group is to discuss communication, barriers to communication, as well as healthy ways to communicate with others.    Participation Level:  Did Not Attend    [name] Gavel 08/04/2024, 4:14 PM

## 2024-08-04 NOTE — Plan of Care (Signed)
   Problem: Education: Goal: Emotional status will improve Outcome: Progressing Goal: Mental status will improve Outcome: Progressing

## 2024-08-04 NOTE — BH IP Treatment Plan (Signed)
 Interdisciplinary Treatment and Diagnostic Plan Update  08/04/2024 Time of Session: 12:30pm Benjamin [name] MRN: [id number]  Principal Diagnosis: Delusional disorder [facility])  Secondary Diagnoses: Principal Problem:   Delusional disorder (HCC) Active Problems:   Laceration of left eyebrow   Major neurocognitive disorder (HCC)   Current Medications:  Current Facility-Administered Medications  Medication Dose Route Frequency Provider Last Rate Last Admin   acetaminophen  (TYLENOL ) tablet 650 mg  650 mg Oral Q6H PRN Coleman, Carolyn H, NP       alum & mag hydroxide-simeth (MAALOX/MYLANTA) 200-200-20 MG/5ML suspension 30 mL  30 mL Oral Q4H PRN Coleman, Carolyn H, NP       docusate sodium  (COLACE) capsule 100 mg  100 mg Oral Daily Jadapalle, Sree, MD   100 mg at 08/04/24 9041   fluPHENAZine  (PROLIXIN ) tablet 10 mg  10 mg Oral BID Shrivastava, Aryendra, MD   10 mg at 08/04/24 9041   magnesium  hydroxide (MILK OF MAGNESIA) suspension 30 mL  30 mL Oral Daily PRN Coleman, Carolyn H, NP       OLANZapine  (ZYPREXA ) injection 5 mg  5 mg Intramuscular TID PRN Coleman, Carolyn H, NP   5 mg at 04/12/24 2230   OLANZapine  zydis (ZYPREXA ) disintegrating tablet 5 mg  5 mg Oral TID PRN Coleman, Carolyn H, NP   5 mg at 07/22/24 0034   polyethylene glycol (MIRALAX  / GLYCOLAX ) packet 17 g  17 g Oral Daily PRN Donnelly Mellow, MD   17 g at 06/04/24 2123   PTA Medications: Medications Prior to Admission  Medication Sig Dispense Refill Last Dose/Taking   docusate sodium  (COLACE) 100 MG capsule Take 100 mg by mouth daily.      omega-3 acid ethyl esters (LOVAZA) 1 g capsule Take 1 g by mouth daily.       Patient Stressors: Traumatic event    Patient Strengths: Communication skills   Treatment Modalities: Medication Management, Group therapy, Case management,  1 to 1 session with clinician, Psychoeducation, Recreational therapy.   Physician Treatment Plan for Primary Diagnosis: Delusional disorder [name]) Long  Term Goal(s): Improvement in symptoms so as ready for discharge   Short Term Goals: Ability to identify changes in lifestyle to reduce recurrence of condition will improve Ability to verbalize feelings will improve Ability to disclose and discuss suicidal ideas Ability to demonstrate self-control will improve Ability to identify and develop effective coping behaviors will improve  Medication Management: Evaluate patient's response, side effects, and tolerance of medication regimen.  Therapeutic Interventions: 1 to 1 sessions, Unit Group sessions and Medication administration.  Evaluation of Outcomes: Progressing  Physician Treatment Plan for Secondary Diagnosis: Principal Problem:   Delusional disorder (HCC) Active Problems:   Laceration of left eyebrow   Major neurocognitive disorder (HCC)  Long Term Goal(s): Improvement in symptoms so as ready for discharge   Short Term Goals: Ability to identify changes in lifestyle to reduce recurrence of condition will improve Ability to verbalize feelings will improve Ability to disclose and discuss suicidal ideas Ability to demonstrate self-control will improve Ability to identify and develop effective coping behaviors will improve     Medication Management: Evaluate patient's response, side effects, and tolerance of medication regimen.  Therapeutic Interventions: 1 to 1 sessions, Unit Group sessions and Medication administration.  Evaluation of Outcomes: Progressing   RN Treatment Plan for Primary Diagnosis: Delusional disorder (HCC) Long Term Goal(s): Knowledge of disease and therapeutic regimen to maintain health will improve  Short Term Goals: Ability to remain free from injury will improve,  Ability to verbalize frustration and anger appropriately will improve, Ability to demonstrate self-control, Ability to participate in decision making will improve, Ability to verbalize feelings will improve, Ability to disclose and discuss  suicidal ideas, Ability to identify and develop effective coping behaviors will improve, and Compliance with prescribed medications will improve  Medication Management: RN will administer medications as ordered by provider, will assess and evaluate patient's response and provide education to patient for prescribed medication. RN will report any adverse and/or side effects to prescribing provider.  Therapeutic Interventions: 1 on 1 counseling sessions, Psychoeducation, Medication administration, Evaluate responses to treatment, Monitor vital signs and CBGs as ordered, Perform/monitor CIWA, COWS, AIMS and Fall Risk screenings as ordered, Perform wound care treatments as ordered.  Evaluation of Outcomes: Progressing   LCSW Treatment Plan for Primary Diagnosis: Delusional disorder [facility]) Long Term Goal(s): Safe transition to appropriate next level of care at discharge, Engage patient in therapeutic group addressing interpersonal concerns.  Short Term Goals: Engage patient in aftercare planning with referrals and resources, Increase social support, Increase ability to appropriately verbalize feelings, Increase emotional regulation, Facilitate acceptance of mental health diagnosis and concerns, Facilitate patient progression through stages of change regarding substance use diagnoses and concerns, Identify triggers associated with mental health/substance abuse issues, and Increase skills for wellness and recovery  Therapeutic Interventions: Assess for all discharge needs, 1 to 1 time with Social worker, Explore available resources and support systems, Assess for adequacy in community support network, Educate family and significant other(s) on suicide prevention, Complete Psychosocial Assessment, Interpersonal group therapy.  Evaluation of Outcomes: Progressing   Progress in Treatment: Attending groups: Yes. Participating in groups: No. Taking medication as prescribed: Yes. Toleration medication:  Yes. Family/Significant other contact made: [name], brother, [phone]  Patient understands diagnosis: No. Discussing patient identified problems/goals with staff: Yes. Medical problems stabilized or resolved: Yes. Denies suicidal/homicidal ideation: Yes. Issues/concerns per patient self-inventory: No. Other: None  New problem(s) identified:  No, Describe:  05/14/24 Update: None  05/24/24 Update: No changes at this time.  Update 05/29/2024:  No changes at this time.  Update 06/03/2024:  No changes at this time. Update 06/08/24: No changes at this time Update 06/13/24: No changes at this time  Update 06/18/24: No changes at this time Update 06/23/24: No changes at this time  Update 06/28/24: No changes at this time Update 07/03/24: No changes at this time Update 07/09/24: No changes at this time Update 07/14/24: No changes at this time  Update 07/19/24: No changes at this time Update 07/25/24: No changes at this time Update 07/30/24: No changes at this time Update:08/04/24: No changes at this time.  New Short Term/Long Term Goal(s): elimination of symptoms of psychosis, medication management for mood stabilization; elimination of SI thoughts; development of comprehensive mental wellness plan. Update 04/02/24: No changes at this time. Update 04/07/24: No changes at this time. Update 04/12/24: No changes at this time Update 04/18/24: No changes at this time  Update 04/23/24: No changes at this time. Update 04/28/2024: No changes at this time.  Update 05/04/2024: No changes at this time.  Update 05/09/2024:  No changes at this time. 05/14/24 Update: No changes at this time. 05/19/24 Update: No changes at this time. 05/24/24 Update: No changes at this time. Update 05/29/2024:  No changes at this time. Update 06/03/2024:  No changes at this time. Update 06/08/24: No changes at this time Update 06/13/24: No changes at this time Update 06/18/24: No changes at this time Update 06/23/24: No changes  at this time  Update 06/28/24: No changes at this time  Update 07/03/24: No changes at this time Update 07/09/24: No changes at this time Update 07/14/24: No changes at this time Update 07/19/24: No changes at this time Update 07/25/24: No changes at this time Update 07/30/24: No changes at this time Update 08/04/24: No changes at this time.  Patient Goals:   Get out of here, I want to get healed up so I can get out of here Update 04/02/24: No changes at this time. Update 04/07/24: No changes at this time .Update 04/12/24: No changes at this time Update 04/18/24: No changes at this time  Update 04/23/24: No changes at this time. Update 04/28/2024: No changes at this time. Update 05/04/2024: No changes at this time.   Update 05/29/2024:  No changes at this time.  Update 06/03/2024:  No changes at this time. Update 06/08/24: No changes at this time Update 06/13/24: No changes at this time Update 06/18/24: No changes at this time Update 06/23/24: No changes at this time Update 06/28/24: No changes at this time  Update 07/03/24: No changes at this time Update 07/09/24: No changes at this time  Update 07/14/24: No changes at this time Update 07/19/24: No changes at this time Update 07/25/24: No changes at this time Update 07/30/24: No changes at this time Update 08/04/24 No changes at this time.  Discharge Plan or Barriers: CSW will assist with appropriate discharge planning  Update 04/02/24: No changes at this time. Update 04/07/24: No changes at this time.Update 04/12/24: No changes at this time Update 04/18/24: CSW submitted report to APS. Care team looking at guardianship for pt at this time  Update 04/23/24: No changes at this time. Update 04/28/2024: No changes at this time. Update 05/04/2024: No changes at this time.  Update 05/09/2024:  [facility] APS continues to search for placement. 05/14/24 Update: [facility] APS continues to look for placement. CSW to continue to assess. 05/19/24 Update: No changes at this time. 05/24/24  Update:[facility] continues to look for placement at this time Update 05/29/2024:  Patient remains on the unit and safe at this time.  Case was accepted by APS [name] and they are looking for placement for the patient.   Update 06/03/2024:  No changes at this time. Update 06/08/24: No changes at this time Update 06/13/24: [facility] APS having difficulty connecting with pt's care coordinator at [facility] to assist with placement Update 06/18/24: No changes at this time Update 06/23/24: Arch reports that she may have found placement for pt. CSW will send TB tests per her request Update 06/28/24: Arch reports pt currently awaiting approval of 1915i. Reports she should know more about placement next week.  Update 07/03/24: No changes at this time Update 07/09/24: Pt still pending placement according to APS. Pt's 1915[facility] is also still pending according to APS. Update 07/14/24: No changes at this time Update 07/19/24: No changes at this time Update 07/25/24: No changes at this time Update 07/30/24: According to APS, 1915i has been approved and they will move forward with placement. APS also reports pt's CCA is pending through [employer] and that should be completed today as well should current placement option fall through. 08/04/24 No changes at this time.  Reason for Continuation of Hospitalization: Delusions  Medication stabilization  Estimated Length of Stay:Estimated Length of Stay: 1 to 7 days Update 04/02/24: TBD. Update 04/07/24: TBD Update 04/12/24:TBD. Update 04/28/24:TBD Update 05/04/2024: TBD Update 05/09/2024:  TBD  05/14/24  Update: TBD. 05/19/24 Update: TBD. 05/24/24 Update: TBD  Update 05/29/2024:  TBD.  Update 06/03/2024:  TBD Update 06/08/24: TBD Update 06/13/24: TBD Update 06/18/24: TBD Update 06/23/24: TBD Update 06/28/24: TBD Update 07/03/24: TBD Update 07/09/24: TBD  Update 07/14/24: TBD Update 07/19/24: TBD Update 07/25/24: TBD Update 07/30/24: TBD  08/04/24: TBD  Last 3 [place]  Suicide Severity Risk Score: Flowsheet Row Admission (Current) from 03/27/2024 in [name] ED from 03/26/2024 in [facility] Emergency Department at [name] ED from 03/25/2024 in [facility]  C-SSRS RISK CATEGORY No Risk No Risk No Risk    Last [facility] 2/9 Scores:     No data to display          Scribe for Treatment Team: Sabria Florido W Hermes Wafer, LCSWA 08/04/2024 3:12 PM

## 2024-08-04 NOTE — Group Note (Signed)
 [facility] LCSW Group Therapy Note   Group Date: 08/04/2024 Start Time: 1601 End Time: 1631   Type of Therapy and Topic: Group Therapy: Avoiding Self-Sabotaging and Enabling Behaviors  Participation Level: Did Not Attend  Did not participate in group  Description of Group:  In this group, patients will learn how to identify obstacles, self-sabotaging and enabling behaviors, as well as: what are they, why do we do them and what needs these behaviors meet. Discuss unhealthy relationships and how to have positive healthy boundaries with those that sabotage and enable. Explore aspects of self-sabotage and enabling in yourself and how to limit these self-destructive behaviors in everyday life.   Therapeutic Goals: 1. Patient will identify one obstacle that relates to self-sabotage and enabling behaviors 2. Patient will identify one personal self-sabotaging or enabling behavior they did prior to admission 3. Patient will state a plan to change the above identified behavior 4. Patient will demonstrate ability to communicate their needs through discussion and/or role play.    Summary of Patient Progress:      Therapeutic Modalities:  Cognitive Behavioral Therapy Person-Centered Therapy Motivational Interviewing    Rexene LELON Mae, LCSWA

## 2024-08-04 NOTE — Group Note (Signed)
 Date:  08/04/2024 Time:  10:15 AM  Group Topic/Focus:  Movement Therapy    Participation Level:  Did Not Attend    Benjamin [name] 08/04/2024, 10:15 AM

## 2024-08-04 NOTE — Progress Notes (Signed)
 Benjamin [name]  08/04/2024 7:29 PM Benjamin [name]  MRN:  [id number] Benjamin [name] is a 78 year old male who presents to the inpatient geriatric psych unit after jumping from a two story building. Patient was originally seen at [facility] who referred him to the ED who admitted him here on 03/27/2024. Patient reports that people from the [place] gang were trying to kill him and the only escape was through the bedroom window on the second floor. He currently lives in the house with [name] and his family and has been living with them for 3-4 years without any problems. On 03/19/2024 he went to the doctor and when he got back home the family wouldn't let him leave. He states that after he realized the gang was going to kill him he barricaded the door with his bed and jumped out the window. When he landed he ran away until one of his neighbors found him and called 911. Patient is admitted to [facility] unit with Q15 min safety monitoring. Multidisciplinary team approach is offered. Medication management; group/milieu therapy is offered.     Subjective: Chart is reviewed and discussed with the treatment team.  APS is working on legal guardianship and placement.  He has guardianship hearing June 19, 2024  Patient is noted to be walking in the hallway.  Patient denies SI/HI/plan and denies hallucinations.  He offers no complaints per nursing patient is able to manage his ADLs and take medication  87/86/7974: Patient is assessed on the inpatient unit. He is standing in his room in no apparent distress. He offers no concerns today. Patient continues to be compliant with medication regimen and has not required any PRN medications for behavioral reasons. All placement items have been updated and his awaiting as safe disposition. He is attending to his ADLS.    Psychiatric History: see h&P  Family History:  Family History  Problem Relation Age of Onset   Colon cancer Neg Hx     Colon polyps Neg Hx    Stomach cancer Neg Hx    Esophageal cancer Neg Hx    Social History:  Social History   Substance and Sexual Activity  Alcohol Use No     Social History   Substance and Sexual Activity  Drug Use No    Social History   Socioeconomic History   Marital status: Single    Spouse name: Not on file   Number of children: Not on file   Years of education: Not on file   Highest education level: Not on file  Occupational History   Not on file  Tobacco Use   Smoking status: Former    Types: Cigarettes   Smokeless tobacco: Never  Vaping Use   Vaping status: Never Used  Substance and Sexual Activity   Alcohol use: No   Drug use: No   Sexual activity: Not on file  Other Topics Concern   Not on file  Social History Narrative   Not on file   Social Drivers of Health   Tobacco Use: Medium Risk (03/27/2024)   Patient History    Smoking Tobacco Use: Former    Smokeless Tobacco Use: Never    Passive Exposure: Not on Actuary Strain: Not on file  Food Insecurity: No Food Insecurity (03/27/2024)   Epic    Worried About Radiation Protection Practitioner of Food in the Last Year: Never true    Ran Out of Food in the Last Year: Never true  Transportation Needs: No Transportation Needs (03/27/2024)   Epic    Lack of Transportation (Medical): No    Lack of Transportation (Non-Medical): No  Physical Activity: Not on file  Stress: Not on file  Social Connections: Moderately Integrated (03/27/2024)   Social Connection and Isolation Panel    Frequency of Communication with Friends and Family: Twice a week    Frequency of Social Gatherings with Friends and Family: Once a week    Attends Religious Services: 1 to 4 times per year    Active Member of [employer] or Organizations: No    Attends Banker Meetings: 1 to 4 times per year    Marital Status: Divorced  Depression (PHQ2-9): Not on file  Alcohol Screen: Low Risk (03/27/2024)   Alcohol Screen    Last Alcohol  Screening Score (AUDIT): 0  Housing: Low Risk (03/27/2024)   Epic    Unable to Pay for Housing in the Last Year: No    Number of Times Moved in the Last Year: 0    Homeless in the Last Year: No  Utilities: Not At Risk (03/27/2024)   Epic    Threatened with loss of utilities: No  Health Literacy: Not on file   Past Medical History:  Past Medical History:  Diagnosis Date   Allergy    Arthritis    back   Prostate cancer (HCC) dx'd 2010   surg only    Past Surgical History:  Procedure Laterality Date   COLONOSCOPY     EYE SURGERY     PROSTATE SURGERY      Current Medications: Current Facility-Administered Medications  Medication Dose Route Frequency Provider Last Rate Last Admin   acetaminophen  (TYLENOL ) tablet 650 mg  650 mg Oral Q6H PRN Coleman, Carolyn H, NP       alum & mag hydroxide-simeth (MAALOX/MYLANTA) 200-200-20 MG/5ML suspension 30 mL  30 mL Oral Q4H PRN Coleman, Carolyn H, NP       docusate sodium  (COLACE) capsule 100 mg  100 mg Oral Daily Jadapalle, Sree, MD   100 mg at 08/04/24 9041   fluPHENAZine  (PROLIXIN ) tablet 10 mg  10 mg Oral BID Shrivastava, Aryendra, MD   10 mg at 08/04/24 9041   magnesium  hydroxide (MILK OF MAGNESIA) suspension 30 mL  30 mL Oral Daily PRN Mardy Elveria DEL, NP       OLANZapine  (ZYPREXA ) injection 5 mg  5 mg Intramuscular TID PRN Mardy Elveria DEL, NP   5 mg at 04/12/24 2230   OLANZapine  zydis (ZYPREXA ) disintegrating tablet 5 mg  5 mg Oral TID PRN Coleman, Carolyn H, NP   5 mg at 07/22/24 0034   polyethylene glycol (MIRALAX  / GLYCOLAX ) packet 17 g  17 g Oral Daily PRN Donnelly Mellow, MD   17 g at 06/04/24 2123    Lab Results:  No results found for this or any previous visit (from the past 48 hours).      Blood Alcohol level:  Lab Results  Component Value Date   [facility] <15 03/26/2024    Metabolic Disorder Labs: Lab Results  Component Value Date   HGBA1C 5.6 04/02/2024   MPG 114 04/02/2024   No results found for:  PROLACTIN Lab Results  Component Value Date   CHOL 146 04/02/2024   TRIG 37 04/02/2024   HDL 43 04/02/2024   CHOLHDL 3.4 04/02/2024   VLDL 7 04/02/2024   LDLCALC 96 04/02/2024    Physical Findings: AIMS:  , ,  ,  ,  CIWA:    COWS:      Psychiatric Specialty Exam:  Presentation  General Appearance:  Appropriate for Environment; Bizarre  Eye Contact: Fair  Speech: Normal rate  Speech Volume: Normal    Mood and Affect  Mood: Fine Affect: Congruent   Thought Process  Thought Processes: Disorganized At baseline Descriptions of Associations:Intact  Orientation:Full (Time, Place and Person)  Thought Content: Impoverished Hallucinations: Denies  Ideas of Reference: denies Suicidal Thoughts: Denies  Homicidal Thoughts: Denies   Sensorium  Memory: impiared  Judgment: Impaired  Insight: Community Education Officer  Concentration: Fair  Attention Span: Fair  Recall: [employer] of Knowledge: Fair  Language: Fair   Psychomotor Activity  Psychomotor Activity: No data recorded  Musculoskeletal: Strength & Muscle Tone: within normal limits Gait & Station: normal Assets  Assets: Manufacturing Systems Engineer; Resilience    Physical Exam: Physical Exam Vitals and nursing [name] reviewed.    ROS Blood pressure (!) 113/57, pulse 72, temperature 98 F (36.7 C), resp. rate 18, height 6' (1.829 m), weight 100 kg, SpO2 100%. Body mass index is 29.91 kg/m.  Diagnosis: Principal Problem:   Delusional disorder (HCC) Active Problems:   Laceration of left eyebrow   Major neurocognitive disorder [facility])  Major Neurocognitive disorder  Clinical Decision Making: Patient currently admitted after jumping off a two-story building in the context of possible delusions as reported as the people in the house he lived for 4 years trying to kill him.  Patient needs to be monitored closely for ongoing psychosis and paranoid delusions.  Given  patient's confusion and chronic paranoia, no family support, APS has taken up guardianship and looking for placement     Safety and Monitoring:             -- InVoluntary admission to inpatient psychiatric unit for safety, stabilization and treatment             -- Daily contact with patient to assess and evaluate symptoms and progress in treatment             -- Patient's case to be discussed in multi-disciplinary team meeting             -- Observation Level: q15 minute checks             -- Vital signs:  q12 hours             -- Precautions: suicide, elopement, and assault   2. Psychiatric Diagnoses and Treatment:              Continue Prolixin  to 10 mg twice daily.  -- The risks/benefits/side-effects/alternatives to this medication were discussed in detail with the patient and time was given for questions. The patient consents to medication trial.                -- Metabolic profile and EKG monitoring obtained while on an atypical antipsychotic (BMI: Lipid Panel: HbgA1c: QTc:)              -- Encouraged patient to participate in unit milieu and in scheduled group therapies                            3. Medical Issues Being Addressed:    Fall on/8/25, CT head normal  4. Discharge Planning:   -- Social work and case management to assist with discharge planning and identification of hospital follow-up needs prior to discharge  -- Estimated  LOS: 3-4 days  Daine KATHEE Ober, NP 08/04/2024, 7:29 PM

## 2024-08-04 NOTE — Plan of Care (Signed)

## 2024-08-04 NOTE — Progress Notes (Signed)
°   08/04/24 0900  Psych Admission Type (Psych Patients Only)  Admission Status Involuntary  Psychosocial Assessment  Patient Complaints None  Eye Contact Brief  Facial Expression Flat  Affect Sullen  Speech Soft  Interaction Minimal  Motor Activity Slow  Appearance/Hygiene In scrubs  Behavior Characteristics Appropriate to situation  Mood Despair  Thought Process  Coherency WDL  Content WDL  Delusions None reported or observed  Perception WDL  Hallucination None reported or observed  Judgment Impaired  Confusion Mild  Danger to Self  Current suicidal ideation? Denies  Danger to Others  Danger to Others None reported or observed

## 2024-08-04 NOTE — Group Note (Signed)
 Date:  08/04/2024 Time:  8:52 PM  Group Topic/Focus:  Wrap-Up Group:   The focus of this group is to help patients review their daily goal of treatment and discuss progress on daily workbooks.    Participation Level:  None  Benjamin [name] [name] 08/04/2024, 8:52 PM

## 2024-08-05 NOTE — Plan of Care (Signed)
   Problem: Coping: Goal: Level of anxiety will decrease Outcome: Progressing   Problem: Pain Managment: Goal: General experience of comfort will improve and/or be controlled Outcome: Progressing

## 2024-08-05 NOTE — Group Note (Signed)
 Date:  08/05/2024 Time:  9:58 PM  Group Topic/Focus:  Wrap-Up Group:   The focus of this group is to help patients review their daily goal of treatment and discuss progress on daily workbooks.    Participation Level:  Active  Participation Quality:  Appropriate  Affect:  Appropriate  Cognitive:  Alert  Insight: Appropriate  Engagement in Group:  Engaged  Modes of Intervention:  Discussion  Additional Comments:    Benjamin [name] [name] 08/05/2024, 9:58 PM

## 2024-08-05 NOTE — Plan of Care (Signed)
  Problem: Coping: Goal: Level of anxiety will decrease Outcome: Progressing   Problem: Education: Goal: Emotional status will improve Outcome: Progressing   

## 2024-08-05 NOTE — Progress Notes (Signed)
°   08/04/24 2312  Psych Admission Type (Psych Patients Only)  Admission Status Involuntary  Psychosocial Assessment  Patient Complaints None  Eye Contact Brief  Facial Expression Flat  Affect Sullen  Speech Soft  Interaction Minimal  Motor Activity Slow  Appearance/Hygiene In scrubs  Behavior Characteristics Appropriate to situation  Mood Despair  Thought Process  Coherency WDL  Content WDL  Delusions None reported or observed  Perception WDL  Hallucination None reported or observed  Judgment Impaired  Confusion Mild  Danger to Self  Current suicidal ideation? Denies  Agreement Not to Harm Self Yes  Description of Agreement verbal  Danger to Others  Danger to Others None reported or observed     Estimated Sleeping Duration (Last 24 Hours): 6.00-7.00 hours

## 2024-08-05 NOTE — [facility] Group Notes (Signed)
 [facility] Group Notes:  (Nursing/MHT/Case Management/Adjunct)  Date:  08/05/2024  Time:  4:00 PM  Type of Therapy:  Nurse Education  Participation Level:  active  Participation Quality:  Appropriate  Affect:  Appropriate  Cognitive:  Oriented  Insight:  Improving  Engagement in Group:  Engaged  Modes of Intervention:  Activity  Summary of Progress/Problems: Participated well.  Benjamin [name] 08/05/2024, 4:00 PM

## 2024-08-05 NOTE — Progress Notes (Signed)
 [facility] MD Progress Note  08/05/2024 11:58 AM Benjamin [name]  MRN:  [id number] Benjamin [name] is a 78 year old male who presents to the inpatient geriatric psych unit after jumping from a two story building. Patient was originally seen at [facility] who referred him to the ED who admitted him here on 03/27/2024. Patient reports that people from the [place] gang were trying to kill him and the only escape was through the bedroom window on the second floor. He currently lives in the house with [name] and his family and has been living with them for 3-4 years without any problems. On 03/19/2024 he went to the doctor and when he got back home the family wouldn't let him leave. He states that after he realized the gang was going to kill him he barricaded the door with his bed and jumped out the window. When he landed he ran away until one of his neighbors found him and called 911. Patient is admitted to [facility] unit with Q15 min safety monitoring. Multidisciplinary team approach is offered. Medication management; group/milieu therapy is offered.     Subjective: Chart is reviewed and discussed with the treatment team.  APS is working on legal guardianship and placement.  He has guardianship hearing June 19, 2024  Patient is noted to be walking in the hallway.  Patient denies SI/HI/plan and denies hallucinations.  He offers no complaints per nursing patient is able to manage his ADLs and take medication  87/85/7974: He appears to be pleasant about possibility of finding a place for him to go patient is assessed on the inpatient unit. He is standing in his room in no apparent distress. He offers no concerns today. Patient continues to be compliant with medication regimen and has not required any PRN medications for behavioral reasons. All placement items have been updated and his awaiting as safe disposition. He is attending to his ADLS.    Psychiatric History: see h&P  Family History:   Family History  Problem Relation Age of Onset   Colon cancer Neg Hx    Colon polyps Neg Hx    Stomach cancer Neg Hx    Esophageal cancer Neg Hx    Social History:  Social History   Substance and Sexual Activity  Alcohol Use No     Social History   Substance and Sexual Activity  Drug Use No    Social History   Socioeconomic History   Marital status: Single    Spouse name: Not on file   Number of children: Not on file   Years of education: Not on file   Highest education level: Not on file  Occupational History   Not on file  Tobacco Use   Smoking status: Former    Types: Cigarettes   Smokeless tobacco: Never  Vaping Use   Vaping status: Never Used  Substance and Sexual Activity   Alcohol use: No   Drug use: No   Sexual activity: Not on file  Other Topics Concern   Not on file  Social History Narrative   Not on file   Social Drivers of Health   Tobacco Use: Medium Risk (03/27/2024)   Patient History    Smoking Tobacco Use: Former    Smokeless Tobacco Use: Never    Passive Exposure: Not on Actuary Strain: Not on file  Food Insecurity: No Food Insecurity (03/27/2024)   Epic    Worried About Radiation Protection Practitioner of Food in the Last Year: Never  true    Ran Out of Food in the Last Year: Never true  Transportation Needs: No Transportation Needs (03/27/2024)   Epic    Lack of Transportation (Medical): No    Lack of Transportation (Non-Medical): No  Physical Activity: Not on file  Stress: Not on file  Social Connections: Moderately Integrated (03/27/2024)   Social Connection and Isolation Panel    Frequency of Communication with Friends and Family: Twice a week    Frequency of Social Gatherings with Friends and Family: Once a week    Attends Religious Services: 1 to 4 times per year    Active Member of [employer] or Organizations: No    Attends Banker Meetings: 1 to 4 times per year    Marital Status: Divorced  Depression (PHQ2-9): Not on file   Alcohol Screen: Low Risk (03/27/2024)   Alcohol Screen    Last Alcohol Screening Score (AUDIT): 0  Housing: Low Risk (03/27/2024)   Epic    Unable to Pay for Housing in the Last Year: No    Number of Times Moved in the Last Year: 0    Homeless in the Last Year: No  Utilities: Not At Risk (03/27/2024)   Epic    Threatened with loss of utilities: No  Health Literacy: Not on file   Past Medical History:  Past Medical History:  Diagnosis Date   Allergy    Arthritis    back   Prostate cancer (HCC) dx'd 2010   surg only    Past Surgical History:  Procedure Laterality Date   COLONOSCOPY     EYE SURGERY     PROSTATE SURGERY      Current Medications: Current Facility-Administered Medications  Medication Dose Route Frequency Provider Last Rate Last Admin   acetaminophen  (TYLENOL ) tablet 650 mg  650 mg Oral Q6H PRN Coleman, Carolyn H, NP       alum & mag hydroxide-simeth (MAALOX/MYLANTA) 200-200-20 MG/5ML suspension 30 mL  30 mL Oral Q4H PRN Coleman, Carolyn H, NP       docusate sodium  (COLACE) capsule 100 mg  100 mg Oral Daily Jadapalle, Sree, MD   100 mg at 08/05/24 0805   fluPHENAZine  (PROLIXIN ) tablet 10 mg  10 mg Oral BID Shrivastava, Aryendra, MD   10 mg at 08/05/24 0805   magnesium  hydroxide (MILK OF MAGNESIA) suspension 30 mL  30 mL Oral Daily PRN Mardy Elveria DEL, NP       OLANZapine  (ZYPREXA ) injection 5 mg  5 mg Intramuscular TID PRN Mardy Elveria DEL, NP   5 mg at 04/12/24 2230   OLANZapine  zydis (ZYPREXA ) disintegrating tablet 5 mg  5 mg Oral TID PRN Coleman, Carolyn H, NP   5 mg at 07/22/24 0034   polyethylene glycol (MIRALAX  / GLYCOLAX ) packet 17 g  17 g Oral Daily PRN Donnelly Mellow, MD   17 g at 06/04/24 2123    Lab Results:  No results found for this or any previous visit (from the past 48 hours).      Blood Alcohol level:  Lab Results  Component Value Date   [facility] <15 03/26/2024    Metabolic Disorder Labs: Lab Results  Component Value Date   HGBA1C 5.6  04/02/2024   MPG 114 04/02/2024   No results found for: PROLACTIN Lab Results  Component Value Date   CHOL 146 04/02/2024   TRIG 37 04/02/2024   HDL 43 04/02/2024   CHOLHDL 3.4 04/02/2024   VLDL 7 04/02/2024   LDLCALC  96 04/02/2024    Physical Findings: AIMS:  , ,  ,  ,    CIWA:    COWS:      Psychiatric Specialty Exam:  Presentation  General Appearance:  Appropriate for Environment; Bizarre  Eye Contact: Fair  Speech: Normal rate  Speech Volume: Normal    Mood and Affect  Mood: Fine Affect: Congruent   Thought Process  Thought Processes: Disorganized At baseline Descriptions of Associations:Intact  Orientation:Full (Time, Place and Person)  Thought Content: Impoverished Hallucinations: Denies  Ideas of Reference: denies Suicidal Thoughts: Denies  Homicidal Thoughts: Denies   Sensorium  Memory: impiared  Judgment: Impaired  Insight: Community Education Officer  Concentration: Fair  Attention Span: Fair  Recall: [employer] of Knowledge: Fair  Language: Fair   Psychomotor Activity  Psychomotor Activity: No data recorded  Musculoskeletal: Strength & Muscle Tone: within normal limits Gait & Station: normal Assets  Assets: Manufacturing Systems Engineer; Resilience    Physical Exam: Physical Exam Vitals and nursing note reviewed.    ROS Blood pressure 115/75, pulse 61, temperature (!) 97.2 F (36.2 C), resp. rate 18, height 6' (1.829 m), weight 100 kg, SpO2 100%. Body mass index is 29.91 kg/m.  Diagnosis: Principal Problem:   Delusional disorder (HCC) Active Problems:   Laceration of left eyebrow   Major neurocognitive disorder [name])  Major Neurocognitive disorder  Clinical Decision Making: Patient currently admitted after jumping off a two-story building in the context of possible delusions as reported as the people in the house he lived for 4 years trying to kill him.  Patient needs to be monitored closely  for ongoing psychosis and paranoid delusions.  Given patient's confusion and chronic paranoia, no family support, APS has taken up guardianship and looking for placement     Safety and Monitoring:             -- InVoluntary admission to inpatient psychiatric unit for safety, stabilization and treatment             -- Daily contact with patient to assess and evaluate symptoms and progress in treatment             -- Patient's case to be discussed in multi-disciplinary team meeting             -- Observation Level: q15 minute checks             -- Vital signs:  q12 hours             -- Precautions: suicide, elopement, and assault   2. Psychiatric Diagnoses and Treatment:              Continue Prolixin  to 10 mg twice daily.  -- The risks/benefits/side-effects/alternatives to this medication were discussed in detail with the patient and time was given for questions. The patient consents to medication trial.                -- Metabolic profile and EKG monitoring obtained while on an atypical antipsychotic (BMI: Lipid Panel: HbgA1c: QTc:)              -- Encouraged patient to participate in unit milieu and in scheduled group therapies                            3. Medical Issues Being Addressed:    Fall on/8/25, CT head normal  4. Discharge Planning:   -- Social work and case  management to assist with discharge planning and identification of hospital follow-up needs prior to discharge  -- Estimated LOS: 3-4 days  Millie JONELLE Manners, MD 08/05/2024, 11:58 AM

## 2024-08-05 NOTE — Group Note (Signed)
 LCSW Group Therapy Note  Group Date: 08/05/2024 Start Time: 1300 End Time: 1345   Type of Therapy and Topic:  Group Therapy - Healthy vs Unhealthy Coping Skills  Participation Level:  None   Description of Group The focus of this group was to determine what unhealthy coping techniques typically are used by group members and what healthy coping techniques would be helpful in coping with various problems. Patients were guided in becoming aware of the differences between healthy and unhealthy coping techniques. Patients were asked to identify 2-3 healthy coping skills they would like to learn to use more effectively.  Therapeutic Goals Patients learned that coping is what human beings do all day long to deal with various situations in their lives Patients defined and discussed healthy vs unhealthy coping techniques Patients identified their preferred coping techniques and identified whether these were healthy or unhealthy Patients determined 2-3 healthy coping skills they would like to become more familiar with and use more often. Patients provided support and ideas to each other   Summary of Patient Progress:  During group, patient was asleep during this group session.    Therapeutic Modalities Cognitive Behavioral Therapy Motivational Interviewing  Aldo CHRISTELLA Niece, [place] 08/05/2024  3:48 PM

## 2024-08-05 NOTE — Progress Notes (Signed)
°   08/05/24 0900  Psych Admission Type (Psych Patients Only)  Admission Status Involuntary  Psychosocial Assessment  Patient Complaints None  Eye Contact Brief  Facial Expression Flat  Affect Sullen  Speech Soft  Interaction Minimal  Motor Activity Slow  Appearance/Hygiene In scrubs  Behavior Characteristics Appropriate to situation  Mood Sullen  Thought Process  Coherency WDL  Content WDL  Delusions None reported or observed  Perception WDL  Hallucination None reported or observed  Judgment Impaired  Confusion Mild  Danger to Self  Current suicidal ideation? Denies  Danger to Others  Danger to Others None reported or observed

## 2024-08-05 NOTE — Group Note (Signed)
 Date:  08/05/2024 Time:  10:21 AM  Group Topic/Focus:  Movement Therapy, Morning Stretch with Roselie Cirigliano.    Participation Level:  Did Not Attend    [name] 08/05/2024, 10:21 AM

## 2024-08-06 NOTE — Plan of Care (Signed)
°  Problem: Clinical Measurements: Goal: Will remain free from infection Outcome: Progressing   Problem: Pain Managment: Goal: General experience of comfort will improve and/or be controlled Outcome: Progressing   Problem: Education: Goal: Knowledge of General Education information will improve Description: Including pain rating scale, medication(s)/side effects and non-pharmacologic comfort measures Outcome: Not Progressing   Problem: Health Behavior/Discharge Planning: Goal: Ability to manage health-related needs will improve Outcome: Not Progressing

## 2024-08-06 NOTE — Progress Notes (Signed)
°   08/06/24 0000  Psych Admission Type (Psych Patients Only)  Admission Status Involuntary  Psychosocial Assessment  Patient Complaints None  Eye Contact Brief  Facial Expression Flat  Affect Sullen  Speech Soft  Interaction Minimal  Motor Activity Slow  Appearance/Hygiene In scrubs  Behavior Characteristics Appropriate to situation  Mood Sullen  Thought Process  Coherency WDL  Content WDL  Delusions None reported or observed  Perception WDL  Hallucination None reported or observed  Judgment Impaired  Confusion Mild  Danger to Self  Current suicidal ideation? Denies  Danger to Others  Danger to Others None reported or observed   Estimated Sleeping Duration (Last 24 Hours): 6.50-8.00 hours

## 2024-08-06 NOTE — Group Note (Signed)
 Date:  08/06/2024 Time:  9:54 PM  Group Topic/Focus:  Wrap-Up Group:   The focus of this group is to help patients review their daily goal of treatment and discuss progress on daily workbooks.    Participation Level:  Active  Participation Quality:  Appropriate  Affect:  Appropriate  Cognitive:  Appropriate  Insight: Appropriate  Engagement in Group:  Engaged  Modes of Intervention:  Discussion  Additional Comments:    Benjamin [name] 08/06/2024, 9:54 PM

## 2024-08-06 NOTE — Group Note (Signed)
 Physical/Occupational Therapy Group Note  Group Topic: Yoga  Group Date: 08/06/2024 Start Time: 1500 End Time: 1530 Facilitators: Celester Morgan, Alm Hamilton, PT   Group Description: Group participated with series of yoga poses, designed to emphasize functional sitting balance, core stability, generalized flexibility and overall posture.  Incorporated deep breathing techniques with poses, working to promote relaxation, mindfulness and focus with targeted activities.  Discussed benefits of yoga in improving mood and self-esteem, reducing stress and anxiety, and promoting functional strength, balance and core stability for each participant.  Discussed ways to integrate into each participants daily routine.  Provided handout with written and pictorial descriptions of included yoga movements to be utilized as appropriate outside of group time.  Therapeutic Goal(s):  Demonstrate safe ability to participate with yoga poses during group activity. Identify one benefit of participation with yoga poses as part of each participants exercise/movement routine. Identify 1-2 individual poses that participant feels most beneficial to his/her needs and that he/she can easily replicate outside of group.  Individual Participation: Pt was quietly observant during the session with occasional participation during the activity portion of the session.  Pt was somewhat drowsy during the session and did appear to fall asleep briefly at times.    Participation Level: Moderate   Participation Quality: Minimal Cues   Behavior: Lethargic   Speech/Thought Process: Very little verbal communication during the session    Affect/Mood: Flat   Insight: Fair   Judgement: Fair   Modes of Intervention: Activity and Discussion  Patient Response to Interventions:  Attentive and Engaged when awake    Plan: Continue to engage patient in PT/OT groups 1 - 2x/week.  CHARM Hamilton Bertin PT, DPT 08/06/2024, 5:15 PM

## 2024-08-06 NOTE — Progress Notes (Signed)
 D- Patient alert and oriented x 2. Pt disoriented to situation and unable to assess if pt oriented to place. Pt denies SI, HI, AVH, and pain.   A- Scheduled medications administered to patient, per MD orders. Support and encouragement provided.  Routine safety checks conducted every 15 minutes.  Patient informed to notify staff with problems or concerns.  R- No adverse drug reactions noted. Patient contracts for safety at this time. Patient compliant with medications and treatment plan. Patient receptive, calm, and cooperative. Patient in the dayroom with others on the unit.  Patient remains safe at this time.   Meara Wiechman S.,RN

## 2024-08-06 NOTE — Plan of Care (Signed)
   Problem: Nutrition: Goal: Adequate nutrition will be maintained Outcome: Progressing

## 2024-08-06 NOTE — Group Note (Signed)
 Date:  08/06/2024 Time:  12:11 PM  Group Topic/Focus:   Coping With Mental Health Crisis:   The purpose of this group is to help patients identify strategies for coping with mental health crisis.  Group discusses possible causes of crisis and ways to manage them effectively. Personal Choices and Values:   The focus of this group is to help patients assess and explore the importance of values in their lives, how their values affect their decisions, how they express their values and what opposes their expression.  The patients had the opportunity to create vision boards and set goals for the upcoming new year. We discussed new goals and dreams, along with realistic time frames for accomplishing them. This activity encouraged reflection, motivation, and hope. It also served as a print production planner experience and promoted positivity and a more optimistic outlook on life.    Participation Level:  Did Not Attend  Participation Quality:    Affect:    Cognitive:    Insight:   Engagement in Group:    Modes of Intervention:    Additional Comments:    Dene Nazir L Robel Wuertz 08/06/2024, 12:11 PM

## 2024-08-06 NOTE — Group Note (Signed)
 Recreation Therapy Group Note   Group Topic:Emotion Expression  Group Date: 08/06/2024 Start Time: 1410 End Time: 1500 Facilitators: Celestia Jeoffrey BRAVO, LRT, CTRS Location: Craft Room  Group Description: Positivity Collage. LRT and patients discussed the importance of having a positive mindset and being happy. Patients received magazines, safety scissors, a glue stick and a piece of paper. Pts were encouraged to find images or words in the magazines that showed happiness or positivity to them. Pt shared their collage with the group once they were finished. LRT and pts discussed how it can be difficult to always have a positive mindset, especially when they have mental health challenges.   Goal Area(s) Addressed:  Pt will identify things associate with positivity. Pt will reduce negative thinking. Pt will identify a new coping skill of thinking positive thoughts.    Affect/Mood: N/A   Participation Level: Non-verbal    Clinical Observations/Individualized Feedback: Carron was present in the dayroom, however, was asleep.   Plan: Continue to engage patient in RT group sessions 2-3x/week.   Jeoffrey BRAVO Celestia, LRT, CTRS 08/06/2024 4:35 PM

## 2024-08-07 DIAGNOSIS — F22 Delusional disorders: Secondary | ICD-10-CM | POA: Diagnosis not present

## 2024-08-07 NOTE — Plan of Care (Signed)
   Problem: Physical Regulation: Goal: Ability to maintain clinical measurements within normal limits will improve Outcome: Progressing   Problem: Safety: Goal: Periods of time without injury will increase Outcome: Progressing

## 2024-08-07 NOTE — Group Note (Signed)
 Recreation Therapy Group Note   Group Topic:General Recreation  Group Date: 08/07/2024 Start Time: 1405 End Time: 1450 Facilitators: Celestia Jeoffrey BRAVO, LRT, CTRS Location: Courtyard  Group Description: [employer]. LRT and patients played games of basketball, drew with chalk, and played corn hole while outside in the courtyard while getting fresh air and sunlight. Music was being played in the background. LRT and peers conversed about different games they have played before, what they do in their free time and anything else that is on their minds. LRT encouraged pts to drink water  after being outside, sweating and getting their heart rate up.  Goal Area(s) Addressed: Patient will build on frustration tolerance skills. Patients will partake in a competitive play game with peers. Patients will gain knowledge of new leisure interest/hobby.    Affect/Mood: N/A   Participation Level: Did not attend    Clinical Observations/Individualized Feedback: Patient did not attend.   Plan: Continue to engage patient in RT group sessions 2-3x/week.   Jeoffrey BRAVO Celestia, LRT, CTRS 08/07/2024 4:37 PM
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
## 2024-08-07 NOTE — Group Note (Signed)
 Date:  08/07/2024 Time:  11:16 PM  Group Topic/Focus:  Wrap-Up Group:   The focus of this group is to help patients review their daily goal of treatment and discuss progress on daily workbooks.    Participation Level:  Did Not Attend  Participation Quality:     Affect:     Cognitive:     Insight: None  Engagement in Group:  None  Modes of Intervention:     Additional Comments:    Benjamin [name] Bunker 08/07/2024, 11:16 PM

## 2024-08-07 NOTE — Progress Notes (Addendum)
°   08/07/24 1600  Psych Admission Type (Psych Patients Only)  Admission Status Involuntary  Psychosocial Assessment  Patient Complaints None  Eye Contact Brief  Facial Expression Flat  Affect Sad  Speech Soft  Interaction Isolative  Motor Activity Slow  Appearance/Hygiene In scrubs  Behavior Characteristics Cooperative  Mood Sullen  Thought Process  Coherency WDL  Content WDL  Delusions None reported or observed  Perception WDL  Hallucination None reported or observed  Judgment Impaired  Confusion Mild  Danger to Self  Current suicidal ideation? Denies  Danger to Others  Danger to Others None reported or observed   Placement has been found this patient. Exact discharge date TBD.

## 2024-08-07 NOTE — Plan of Care (Signed)
°  Problem: Clinical Measurements: Goal: Ability to maintain clinical measurements within normal limits will improve Outcome: Progressing   Problem: Nutrition: Goal: Adequate nutrition will be maintained Outcome: Progressing   Problem: Safety: Goal: Ability to remain free from injury will improve Outcome: Progressing   Problem: Activity: Goal: Interest or engagement in activities will improve Outcome: Progressing

## 2024-08-07 NOTE — Group Note (Signed)
 Date:  08/07/2024 Time:  11:38 AM  Group Topic/Focus:  Goals Group:   The focus of this group is to help patients establish daily goals to achieve during treatment and discuss how the patient can incorporate goal setting into their daily lives to aide in recovery. Healthy Communication:   The focus of this group is to discuss communication, barriers to communication, as well as healthy ways to communicate with others.    Participation Level:  Minimal  Participation Quality:  Appropriate  Affect:  Appropriate  Cognitive:  Appropriate  Insight: Limited  Engagement in Group:  Limited  Modes of Intervention:  Activity and Discussion  Additional Comments:    Reyanne Hussar L [facility] 08/07/2024, 11:38 AM

## 2024-08-07 NOTE — Progress Notes (Signed)
°   08/06/24 2020  Psych Admission Type (Psych Patients Only)  Admission Status Involuntary  Psychosocial Assessment  Patient Complaints None  Eye Contact Brief  Facial Expression Flat  Affect Sullen  Speech Soft  Interaction No initiation  Motor Activity Slow  Appearance/Hygiene In scrubs  Behavior Characteristics Cooperative;Appropriate to situation  Mood Pleasant  Thought Process  Coherency Unable to assess  Content WDL  Delusions None reported or observed  Perception UTA  Hallucination None reported or observed  Judgment Impaired  Confusion Mild  Danger to Self  Current suicidal ideation? Denies  Agreement Not to Harm Self Yes  Description of Agreement Verbal  Danger to Others  Danger to Others None reported or observed

## 2024-08-07 NOTE — Group Note (Signed)
 Date:  08/07/2024 Time:  4:01 PM  Group Topic/Focus:  Healthy Communication:   The focus of this group is to discuss communication, barriers to communication, as well as healthy ways to communicate with others. Making Healthy Choices:   The focus of this group is to help patients identify negative/unhealthy choices they were using prior to admission and identify positive/healthier coping strategies to replace them upon discharge.  I provided structured worksheets designed to promote cognitive engagement, social interaction, and conversation. Worksheets included activities that encouraged critical thinking and verbal expression, such as This or That (Would You Rather) questions and prompts focused on favorite memories. The group reviewed the worksheet together, with questions read aloud to support comprehension and inclusion.  In addition to the worksheet activities, the group participated in Guess That Song, which encouraged memory recall, attention, and peer interaction. An acts of kindness discussion was also facilitated, allowing participants to reflect on positive behaviors and share personal experiences.  Participants demonstrated engagement through verbal responses, shared memories, and interaction with peers. The group setting promoted socialization, reminiscence, and positive affect. Modifications were provided as needed, including verbal prompting and allowing responses to be given verbally rather than written.  The session supported cognitive stimulation, emotional expression, and group cohesion in a supportive environment.    Participation Level:  Minimal  Participation Quality:  Appropriate  Affect:  Appropriate  Cognitive:  Appropriate  Insight: Limited  Engagement in Group:  Limited   Modes of Intervention:  Activity and Discussion  Additional Comments:    Lyncoln Maskell L Tilmon Wisehart 08/07/2024, 4:01 PM

## 2024-08-07 NOTE — Group Note (Signed)
 LCSW Group Therapy Note   Group Date: 08/07/2024 Start Time: 1315 End Time: 1330   Type of Therapy and Topic:  Group Therapy: Boundaries  Participation Level:  Did Not Attend  Description of Group: This group will address the use of boundaries in their personal lives. Patients will explore why boundaries are important, the difference between healthy and unhealthy boundaries, and negative and postive outcomes of different boundaries and will look at how boundaries can be crossed.  Patients will be encouraged to identify current boundaries in their own lives and identify what kind of boundary is being set. Facilitators will guide patients in utilizing problem-solving interventions to address and correct types boundaries being used and to address when no boundary is being used. Understanding and applying boundaries will be explored and addressed for obtaining and maintaining a balanced life. Patients will be encouraged to explore ways to assertively make their boundaries and needs known to significant others in their lives, using other group members and facilitator for role play, support, and feedback.  Therapeutic Goals:  1.  Patient will identify areas in their life where setting clear boundaries could be  used to improve their life.  2.  Patient will identify signs/triggers that a boundary is not being respected. 3.  Patient will identify two ways to set boundaries in order to achieve balance in  their lives: 4.  Patient will demonstrate ability to communicate their needs and set boundaries  through discussion and/or role plays  Summary of Patient Progress:  x  Therapeutic Modalities:   Cognitive Behavioral Therapy Solution-Focused Therapy  [name], LCSWA 08/07/2024  1:54 PM

## 2024-08-08 DIAGNOSIS — F22 Delusional disorders: Secondary | ICD-10-CM | POA: Diagnosis not present

## 2024-08-08 NOTE — [place] FL2 (Signed)
°  [place]  MEDICAID [id number] LEVEL OF CARE FORM     IDENTIFICATION  Patient Name: Benjamin [name] Birthdate: 08/27/1945 Sex: male Admission Date (Current Location): 03/27/2024  [place] and [place] Number:  [place] [id number] [facility] Facility and Address:  [facility], [address] [place], [place] [address]      Provider Number: [id number]  Attending Physician Name and Address:  Donnelly Mellow, MD  Relative Name and Phone Number:  [name], APS Caseworker, [phone]    Current Level of Care: Hospital Recommended Level of Care: Assisted Living Facility, Skilled Nursing Facility Prior Approval Number:    Date Approved/Denied:   PASRR Number:    Discharge Plan: Other (Comment)    Current Diagnoses: Patient Active Problem List   Diagnosis Date Noted   Major neurocognitive disorder (HCC) 07/09/2024   Laceration of left eyebrow 07/01/2024   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   Psychosis (HCC) 03/26/2024    Orientation RESPIRATION BLADDER Height & Weight     Self  Normal Continent Weight: 100 kg Height:  6' (182.9 cm)  BEHAVIORAL SYMPTOMS/MOOD NEUROLOGICAL BOWEL NUTRITION STATUS  Dangerous to self, others or property, Other (Comment) (Others or property.)   Continent    AMBULATORY STATUS COMMUNICATION OF NEEDS Skin   Independent Verbally Normal                       Personal Care Assistance Level of Assistance              Functional Limitations Info  Speech     Speech Info: Adequate    SPECIAL CARE FACTORS FREQUENCY  Blood pressure Blood Pressure Frequency: 2X daily.                  Contractures Contractures Info: Not present    Additional Factors Info  Code Status Code Status Info: Full Allergies Info: None Psychotropic Info: fluPHENAZine  (PROLIXIN ) tablet 10 mg         Current Medications (08/08/2024):  This is the current hospital active medication list Current Facility-Administered Medications   Medication Dose Route Frequency Provider Last Rate Last Admin   acetaminophen  (TYLENOL ) tablet 650 mg  650 mg Oral Q6H PRN Coleman, Carolyn H, NP       alum & mag hydroxide-simeth (MAALOX/MYLANTA) 200-200-20 MG/5ML suspension 30 mL  30 mL Oral Q4H PRN Coleman, Carolyn H, NP       docusate sodium  (COLACE) capsule 100 mg  100 mg Oral Daily Jadapalle, Sree, MD   100 mg at 08/08/24 0800   fluPHENAZine  (PROLIXIN ) tablet 10 mg  10 mg Oral BID Shrivastava, Aryendra, MD   10 mg at 08/08/24 0800   magnesium  hydroxide (MILK OF MAGNESIA) suspension 30 mL  30 mL Oral Daily PRN Coleman, Carolyn H, NP       OLANZapine  (ZYPREXA ) injection 5 mg  5 mg Intramuscular TID PRN Coleman, Carolyn H, NP   5 mg at 04/12/24 2230   OLANZapine  zydis (ZYPREXA ) disintegrating tablet 5 mg  5 mg Oral TID PRN Coleman, Carolyn H, NP   5 mg at 07/22/24 0034   polyethylene glycol (MIRALAX  / GLYCOLAX ) packet 17 g  17 g Oral Daily PRN Donnelly Mellow, MD   17 g at 06/04/24 2123     Discharge Medications: Please see discharge summary for a list of discharge medications.  Relevant Imaging Results:  Relevant Lab Results:   Additional Information SSN# [id number]  Alveta CHRISTELLA Kerns, LCSW

## 2024-08-08 NOTE — Group Note (Signed)
 Date:  08/08/2024 Time:  3:52 PM  Group Topic/Focus:  Emotional Education:   The focus of this group is to discuss what feelings/emotions are, and how they are experienced.    Participation Level:  Active  Participation Quality:  Appropriate  Affect:  Appropriate  Cognitive:  Appropriate  Insight: Appropriate  Engagement in Group:  Engaged  Modes of Intervention:  Discussion   Benjamin [name] 08/08/2024, 3:52 PM

## 2024-08-08 NOTE — Progress Notes (Signed)
 SI/HI: denies  Behavior/Mood: alert and oriented with forgetfulness. Flat affect. Denies anxiety and depression.   Interaction/Group: Min interaction with peers and staff. Did not participant in group.  Medications/PRNs: po med compliant. No PRNs given  Pain: denies  Other: slept 11.5 hours   08/07/24 2100  Psych Admission Type (Psych Patients Only)  Admission Status Involuntary  Psychosocial Assessment  Patient Complaints None  Eye Contact Brief  Facial Expression Flat  Affect Sad  Speech Soft  Interaction Minimal  Motor Activity Slow  Appearance/Hygiene In scrubs  Behavior Characteristics Cooperative  Mood Sullen  Thought Process  Coherency WDL  Content WDL  Delusions None reported or observed  Perception WDL  Hallucination None reported or observed  Judgment Impaired  Confusion Mild  Danger to Self  Current suicidal ideation? Denies

## 2024-08-08 NOTE — Plan of Care (Signed)
  Problem: Nutrition: Goal: Adequate nutrition will be maintained Outcome: Progressing   Problem: Coping: Goal: Level of anxiety will decrease Outcome: Progressing   Problem: Elimination: Goal: Will not experience complications related to bowel motility Outcome: Progressing Goal: Will not experience complications related to urinary retention Outcome: Progressing   Problem: Safety: Goal: Ability to remain free from injury will improve Outcome: Progressing   

## 2024-08-08 NOTE — Progress Notes (Addendum)
 Pt in the day room when writer witnessed pt actively sliding down the wall. pt fell on his buttocks. Pt assessed by clinical research associate and lead nurse. No injury noted. Pt responded appropriately to all questions and laughing and joking with staff. Pt alert and oriented to person, place, and time. Pt denies pain and states he was walking and his foot did not move fast which caused pt to fall. Pt was walking to bring his lunch tray to the lunch cart and writer noticed pt falling while speaking with another pt at the nurse's station. VS taken. Nurse manager and provider informed of pt fall. Provider at pt side to assess in the day room.  Sonia Stickels S.,RN

## 2024-08-08 NOTE — Progress Notes (Signed)
 Mobility Specialist Progress Note:    08/08/24 1255  Mobility  Activity Ambulated with assistance  Level of Assistance Contact guard assist, steadying assist  Assistive Device None  Distance Ambulated (ft) 20 ft  Range of Motion/Exercises Active;All extremities  Activity Response Tolerated well  Mobility visit 1 Mobility  Mobility Specialist Start Time (ACUTE ONLY) 1247  Mobility Specialist Stop Time (ACUTE ONLY) 1255  Mobility Specialist Time Calculation (min) (ACUTE ONLY) 8 min   Pt ambulating in room, advised to use RW for stability and balance. Requires re orientation to why RW is needed. Pt requires CGA to stand and ambulate with no AD. Left pt sitting EOB, all needs met.  Sherrilee Ditty Mobility Specialist Please contact via Special Educational Needs Teacher or  Rehab office at [phone]

## 2024-08-08 NOTE — BHH Counselor (Signed)
 CSW contacted [name] regarding pt's placement. Arch reported that group home placements tend to take longer and that if patient is suitable for Skilled Nursing or Assisted Living placement, this may expedite process.   CSW has communicated this to team and provider.   If provider and team believe that patient could benefit from either, CSW will update FL2 and provide to [facility] for placement.   CSW to continue to assess.   Yamilette Garretson, MSW, LCSWA 08/08/2024 11:53 AM :

## 2024-08-08 NOTE — Group Note (Signed)
 Date:  08/08/2024 Time:  8:55 PM  Group Topic/Focus:  Wrap-Up Group:   The focus of this group is to help patients review their daily goal of treatment and discuss progress on daily workbooks.    Participation Level:  Active  Participation Quality:  Appropriate  Affect:  Appropriate  Cognitive:  Alert  Insight: Appropriate  Engagement in Group:  Engaged  Modes of Intervention:  Discussion  Additional Comments:    Sherrilyn JAYSON Redman 08/08/2024, 8:55 PM

## 2024-08-08 NOTE — Plan of Care (Signed)
  Problem: Skin Integrity: Goal: Risk for impaired skin integrity will decrease Outcome: Progressing   Problem: Safety: Goal: Ability to remain free from injury will improve Outcome: Not Progressing
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
## 2024-08-08 NOTE — BHH Counselor (Addendum)
 FL2 Updated for Skilled nursing or assisted living placement needs. Updated FL2 sent to CSW, [place] to be sent to [name], DSS Caseworker.   Maleaha Hughett, MSW, LCSWA 08/08/2024 3:41 PM

## 2024-08-08 NOTE — Progress Notes (Signed)
°   08/08/24 1400  Psych Admission Type (Psych Patients Only)  Admission Status Involuntary  Psychosocial Assessment  Patient Complaints None  Eye Contact Brief  Facial Expression Flat  Affect Sad  Speech Soft  Interaction Isolative  Motor Activity Slow  Appearance/Hygiene In scrubs  Behavior Characteristics Cooperative  Mood Preoccupied  Thought Process  Coherency WDL  Content WDL  Delusions None reported or observed  Perception WDL  Hallucination None reported or observed  Judgment Impaired  Confusion Mild  Danger to Self  Current suicidal ideation? Denies  Danger to Others  Danger to Others None reported or observed   Patient sustained a witnessed fall today

## 2024-08-08 NOTE — Group Note (Signed)
 Date:  08/08/2024 Time:  10:54 AM  Group Topic/Focus:  Healthy Communication:   The focus of this group is to discuss communication, barriers to communication, as well as healthy ways to communicate with others.    Participation Level:  Active  Participation Quality:  Appropriate  Affect:  Appropriate  Cognitive:  Lacking  Insight: Good  Engagement in Group:  None  Modes of Intervention:  Activity  Additional Comments:  N/A  [name] Gavel 08/08/2024, 10:54 AM

## 2024-08-09 DIAGNOSIS — F22 Delusional disorders: Secondary | ICD-10-CM | POA: Diagnosis not present

## 2024-08-09 NOTE — Group Note (Signed)
 Date:  08/09/2024 Time:  4:08 PM  Group Topic/Focus:  Overcoming Stress:   The focus of this group is to define stress and help patients assess their triggers.    Participation Level:  Did Not Attend   Benjamin [name] 08/09/2024, 4:08 PM

## 2024-08-09 NOTE — BHH Counselor (Signed)
 Updated FL2 sent via email to DSS Casework, [name] at [email or web address].   CSW to continue to assess.   Krisna Omar, MSW, LCSWA 08/09/2024 11:41 AM

## 2024-08-09 NOTE — Plan of Care (Signed)
  Problem: Safety: Goal: Periods of time without injury will increase Outcome: Progressing   Problem: Activity: Goal: Interest or engagement in activities will improve Outcome: Not Progressing

## 2024-08-09 NOTE — Progress Notes (Signed)
°   08/09/24 0028  Psych Admission Type (Psych Patients Only)  Admission Status Involuntary  Psychosocial Assessment  Patient Complaints None  Eye Contact Brief  Facial Expression Flat  Affect Sad  Speech Soft  Interaction Isolative  Motor Activity Slow  Appearance/Hygiene In scrubs  Behavior Characteristics Cooperative  Mood Preoccupied  Thought Process  Coherency WDL  Content WDL  Delusions None reported or observed  Perception WDL  Hallucination None reported or observed  Judgment Impaired  Confusion Mild  Danger to Self  Current suicidal ideation? Denies  Danger to Others  Danger to Others None reported or observed    Estimated Sleeping Duration (Last 24 Hours): 7.75-9.25 hours

## 2024-08-09 NOTE — Group Note (Signed)
 Recreation Therapy Group Note   Group Topic:Emotion Expression  Group Date: 08/09/2024 Start Time: 1400 End Time: 1435 Facilitators: Celestia Jeoffrey BRAVO, LRT, CTRS Location: Dayroom  Group Description: Expressive Higher Education Careers Adviser. Patients received a blank postcard template. LRT encouraged pt to create a postcard to themselves in their younger days with the knowledge and wisdom they have today. Pts are encouraged to share something positive or words of encouragement on their post cards using colored pencils and markers. Once finished, patients and LRT had a discussion on why they chose the words they did and what it means to them.  Goal Area(s) Addressed: Patient will increase communication skills.  Patient will reminisce a fond memory in their life.   Patient will practice healthy decision making. Patient will express their emotions in a positive way.    Affect/Mood: N/A   Participation Level: Did not attend    Clinical Observations/Individualized Feedback: Patient did not attend.  Plan: Continue to engage patient in RT group sessions 2-3x/week.   Jeoffrey BRAVO Celestia, LRT, CTRS 08/09/2024 5:07 PM

## 2024-08-09 NOTE — BH IP Treatment Plan (Signed)
 Interdisciplinary Treatment and Diagnostic Plan Update  08/09/2024 Time of Session: 9:00 AM Benjamin [name] MRN: [id number]  Principal Diagnosis: Delusional disorder [name])  Secondary Diagnoses: Principal Problem:   Delusional disorder (HCC) Active Problems:   Laceration of left eyebrow   Major neurocognitive disorder (HCC)   Current Medications:  Current Facility-Administered Medications  Medication Dose Route Frequency Provider Last Rate Last Admin   acetaminophen  (TYLENOL ) tablet 650 mg  650 mg Oral Q6H PRN Coleman, Carolyn H, NP       alum & mag hydroxide-simeth (MAALOX/MYLANTA) 200-200-20 MG/5ML suspension 30 mL  30 mL Oral Q4H PRN Coleman, Carolyn H, NP       docusate sodium  (COLACE) capsule 100 mg  100 mg Oral Daily Jadapalle, Sree, MD   100 mg at 08/09/24 0802   fluPHENAZine  (PROLIXIN ) tablet 10 mg  10 mg Oral BID Shrivastava, Aryendra, MD   10 mg at 08/09/24 0802   magnesium  hydroxide (MILK OF MAGNESIA) suspension 30 mL  30 mL Oral Daily PRN Coleman, Carolyn H, NP       OLANZapine  (ZYPREXA ) injection 5 mg  5 mg Intramuscular TID PRN Mardy Elveria DEL, NP   5 mg at 04/12/24 2230   OLANZapine  zydis (ZYPREXA ) disintegrating tablet 5 mg  5 mg Oral TID PRN Coleman, Carolyn H, NP   5 mg at 07/22/24 0034   polyethylene glycol (MIRALAX  / GLYCOLAX ) packet 17 g  17 g Oral Daily PRN Donnelly Mellow, MD   17 g at 06/04/24 2123   PTA Medications: Medications Prior to Admission  Medication Sig Dispense Refill Last Dose/Taking   docusate sodium  (COLACE) 100 MG capsule Take 100 mg by mouth daily.      omega-3 acid ethyl esters (LOVAZA) 1 g capsule Take 1 g by mouth daily.       Patient Stressors: Traumatic event    Patient Strengths: Communication skills   Treatment Modalities: Medication Management, Group therapy, Case management,  1 to 1 session with clinician, Psychoeducation, Recreational therapy.   Physician Treatment Plan for Primary Diagnosis: Delusional disorder [facility]) Long  Term Goal(s): Improvement in symptoms so as ready for discharge   Short Term Goals: Ability to identify changes in lifestyle to reduce recurrence of condition will improve Ability to verbalize feelings will improve Ability to disclose and discuss suicidal ideas Ability to demonstrate self-control will improve Ability to identify and develop effective coping behaviors will improve  Medication Management: Evaluate patient's response, side effects, and tolerance of medication regimen.  Therapeutic Interventions: 1 to 1 sessions, Unit Group sessions and Medication administration.  Evaluation of Outcomes: Progressing  Physician Treatment Plan for Secondary Diagnosis: Principal Problem:   Delusional disorder (HCC) Active Problems:   Laceration of left eyebrow   Major neurocognitive disorder (HCC)  Long Term Goal(s): Improvement in symptoms so as ready for discharge   Short Term Goals: Ability to identify changes in lifestyle to reduce recurrence of condition will improve Ability to verbalize feelings will improve Ability to disclose and discuss suicidal ideas Ability to demonstrate self-control will improve Ability to identify and develop effective coping behaviors will improve     Medication Management: Evaluate patient's response, side effects, and tolerance of medication regimen.  Therapeutic Interventions: 1 to 1 sessions, Unit Group sessions and Medication administration.  Evaluation of Outcomes: Progressing   RN Treatment Plan for Primary Diagnosis: Delusional disorder (HCC) Long Term Goal(s): Knowledge of disease and therapeutic regimen to maintain health will improve  Short Term Goals: Ability to verbalize frustration and anger appropriately  will improve, Ability to demonstrate self-control, Ability to participate in decision making will improve, Ability to verbalize feelings will improve, Ability to disclose and discuss suicidal ideas, and Ability to identify and develop  effective coping behaviors will improve  Medication Management: RN will administer medications as ordered by provider, will assess and evaluate patient's response and provide education to patient for prescribed medication. RN will report any adverse and/or side effects to prescribing provider.  Therapeutic Interventions: 1 on 1 counseling sessions, Psychoeducation, Medication administration, Evaluate responses to treatment, Monitor vital signs and CBGs as ordered, Perform/monitor CIWA, COWS, AIMS and Fall Risk screenings as ordered, Perform wound care treatments as ordered.  Evaluation of Outcomes: Progressing   LCSW Treatment Plan for Primary Diagnosis: Delusional disorder [facility]) Long Term Goal(s): Safe transition to appropriate next level of care at discharge, Engage patient in therapeutic group addressing interpersonal concerns.  Short Term Goals: Engage patient in aftercare planning with referrals and resources, Increase social support, Increase ability to appropriately verbalize feelings, Increase emotional regulation, Facilitate acceptance of mental health diagnosis and concerns, Facilitate patient progression through stages of change regarding substance use diagnoses and concerns, Identify triggers associated with mental health/substance abuse issues, and Increase skills for wellness and recovery  Therapeutic Interventions: Assess for all discharge needs, 1 to 1 time with Social worker, Explore available resources and support systems, Assess for adequacy in community support network, Educate family and significant other(s) on suicide prevention, Complete Psychosocial Assessment, Interpersonal group therapy.  Evaluation of Outcomes: Progressing   Progress in Treatment: Attending groups: Yes. 08/09/24 Update: Yes. And No.  Participating in groups: No. 08/09/24 Update: Yes. And No.  Taking medication as prescribed: Yes. 08/09/24 Update: Yes.  Toleration medication: Yes. 08/09/24 Update:  Yes.  Family/Significant other contact made: [name], brother, [phone] 08/09/24 Update: Yes, [name], DSS Caseworker Patient understands diagnosis: No. 08/09/24 Update: No.  Discussing patient identified problems/goals with staff: Yes. 08/09/24 Update: Yes.  Medical problems stabilized or resolved: Yes. 08/09/24 Update: Yes.  Denies suicidal/homicidal ideation: Yes. 08/09/24 Update: Yes.  Issues/concerns per patient self-inventory: No. 08/09/24 Update: No.  Other: None 08/09/24 Update: None.   New problem(s) identified:  No, Describe:  05/14/24 Update: None  05/24/24 Update: No changes at this time.  Update 05/29/2024:  No changes at this time.  Update 06/03/2024:  No changes at this time. Update 06/08/24: No changes at this time Update 06/13/24: No changes at this time  Update 06/18/24: No changes at this time Update 06/23/24: No changes at this time  Update 06/28/24: No changes at this time Update 07/03/24: No changes at this time Update 07/09/24: No changes at this time Update 07/14/24: No changes at this time  Update 07/19/24: No changes at this time Update 07/25/24: No changes at this time Update 07/30/24: No changes at this time Update:08/04/24: No changes at this time. 08/09/24 Update: No changes at this time.    New Short Term/Long Term Goal(s): elimination of symptoms of psychosis, medication management for mood stabilization; elimination of SI thoughts; development of comprehensive mental wellness plan. Update 04/02/24: No changes at this time. Update 04/07/24: No changes at this time. Update 04/12/24: No changes at this time Update 04/18/24: No changes at this time  Update 04/23/24: No changes at this time. Update 04/28/2024: No changes at this time.  Update 05/04/2024: No changes at this time.  Update 05/09/2024:  No changes at this time. 05/14/24 Update: No changes at this time. 05/19/24 Update: No changes at this time. 05/24/24 Update:  No changes at this time. Update 05/29/2024:  No  changes at this time. Update 06/03/2024:  No changes at this time. Update 06/08/24: No changes at this time Update 06/13/24: No changes at this time Update 06/18/24: No changes at this time Update 06/23/24: No changes at this time Update 06/28/24: No changes at this time  Update 07/03/24: No changes at this time Update 07/09/24: No changes at this time Update 07/14/24: No changes at this time Update 07/19/24: No changes at this time Update 07/25/24: No changes at this time Update 07/30/24: No changes at this time Update 08/04/24: No changes at this time. 08/09/24 Update: No changes at this time.   Patient Goals:   Get out of here, I want to get healed up so I can get out of here Update 04/02/24: No changes at this time. Update 04/07/24: No changes at this time .Update 04/12/24: No changes at this time Update 04/18/24: No changes at this time  Update 04/23/24: No changes at this time. Update 04/28/2024: No changes at this time. Update 05/04/2024: No changes at this time.   Update 05/29/2024:  No changes at this time.  Update 06/03/2024:  No changes at this time. Update 06/08/24: No changes at this time Update 06/13/24: No changes at this time Update 06/18/24: No changes at this time Update 06/23/24: No changes at this time Update 06/28/24: No changes at this time  Update 07/03/24: No changes at this time Update 07/09/24: No changes at this time  Update 07/14/24: No changes at this time Update 07/19/24: No changes at this time Update 07/25/24: No changes at this time Update 07/30/24: No changes at this time Update 08/04/24 No changes at this time. 08/09/24 Update: No changes at this time.    Discharge Plan or Barriers: CSW will assist with appropriate discharge planning  Update 04/02/24: No changes at this time. Update 04/07/24: No changes at this time.Update 04/12/24: No changes at this time Update 04/18/24: CSW submitted report to APS. Care team looking at guardianship for pt at this time  Update 04/23/24: No changes at this  time. Update 04/28/2024: No changes at this time. Update 05/04/2024: No changes at this time.  Update 05/09/2024:  [facility] APS continues to search for placement. 05/14/24 Update: [facility] APS continues to look for placement. CSW to continue to assess. 05/19/24 Update: No changes at this time. 05/24/24 Update:DSS continues to look for placement at this time Update 05/29/2024:  Patient remains on the unit and safe at this time.  Case was accepted by APS [facility] and they are looking for placement for the patient.   Update 06/03/2024:  No changes at this time. Update 06/08/24: No changes at this time Update 06/13/24: [facility] APS having difficulty connecting with pt's care coordinator at [facility] to assist with placement Update 06/18/24: No changes at this time Update 06/23/24: Arch reports that she may have found placement for pt. CSW will send TB tests per her request Update 06/28/24: Arch reports pt currently awaiting approval of [facility]. Reports she should know more about placement next week.  Update 07/03/24: No changes at this time Update 07/09/24: Pt still pending placement according to APS. Pt's 1915[facility] is also still pending according to APS. Update 07/14/24: No changes at this time Update 07/19/24: No changes at this time Update 07/25/24: No changes at this time Update 07/30/24: According to APS, [facility] has been approved and they will move forward with placement. APS also reports pt's CCA is pending  through [employer] and that should be completed today as well should current placement option fall through. 08/04/24 No changes at this time. 08/09/24 Update: Updated FL2 sent to [name] with DSS for skilled nursing and assisted living centers following changes in patient's disposition.    Reason for Continuation of Hospitalization: Delusions  Medication stabilization  Estimated Length of Stay:Estimated Length of Stay: 1 to 7 days Update 04/02/24: TBD. Update 04/07/24: TBD  Update 04/12/24:TBD. Update 04/28/24:TBD Update 05/04/2024: TBD Update 05/09/2024:  TBD  05/14/24 Update: TBD. 05/19/24 Update: TBD. 05/24/24 Update: TBD  Update 05/29/2024:  TBD.  Update 06/03/2024:  TBD Update 06/08/24: TBD Update 06/13/24: TBD Update 06/18/24: TBD Update 06/23/24: TBD Update 06/28/24: TBD Update 07/03/24: TBD Update 07/09/24: TBD  Update 07/14/24: TBD Update 07/19/24: TBD Update 07/25/24: TBD Update 07/30/24: TBD  08/04/24: TBD 08/09/24 Update: TBD.   Last 3 [place] Suicide Severity Risk Score: Flowsheet Row Admission (Current) from 03/27/2024 in [name] ED from 03/26/2024 in [facility] Emergency Department at [facility] ED from 03/25/2024 in [facility]  C-SSRS RISK CATEGORY No Risk No Risk No Risk    Last [facility] 2/9 Scores:     No data to display          Scribe for Treatment Team: Alveta CHRISTELLA Kerns, KEN 08/09/2024 5:03 PM
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
## 2024-08-09 NOTE — Plan of Care (Signed)
   Problem: Nutrition: Goal: Adequate nutrition will be maintained Outcome: Progressing   Problem: Coping: Goal: Level of anxiety will decrease Outcome: Progressing

## 2024-08-09 NOTE — Progress Notes (Signed)
 SI/HI/AVH: denies all  Behavior/Mood: cooperative/preoccupied    Interaction/Group attendance: isolative/ 0 of 3 groups   Medication/PRNs: compliant/no PRN's   Pain: denies  Other: fall sustained a fall 12.17.25

## 2024-08-09 NOTE — Group Note (Signed)
 [facility] LCSW Group Therapy Note   Group Date: 08/09/2024 Start Time: 1300 End Time: 1400   Type of Therapy/Topic:  Group Therapy:  Balance in Life  Participation Level:  Did Not Attend   Description of Group:    This group will address the concept of balance and how it feels and looks when one is unbalanced. Patients will be encouraged to process areas in their lives that are out of balance, and identify reasons for remaining unbalanced. Facilitators will guide patients utilizing problem- solving interventions to address and correct the stressor making their life unbalanced. Understanding and applying boundaries will be explored and addressed for obtaining  and maintaining a balanced life. Patients will be encouraged to explore ways to assertively make their unbalanced needs known to significant others in their lives, using other group members and facilitator for support and feedback.  Therapeutic Goals: Patient will identify two or more emotions or situations they have that consume much of in their lives. Patient will identify signs/triggers that life has become out of balance:  Patient will identify two ways to set boundaries in order to achieve balance in their lives:  Patient will demonstrate ability to communicate their needs through discussion and/or role plays  Summary of Patient Progress:    Patient did not attend.     Therapeutic Modalities:   Cognitive Behavioral Therapy Solution-Focused Therapy Assertiveness Training   Alveta CHRISTELLA Kerns, LCSW
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
## 2024-08-09 NOTE — Progress Notes (Signed)
 Mobility Specialist - Progress Note   08/09/24 1338  Mobility  Activity Ambulated with assistance  Level of Assistance Contact guard assist, steadying assist  Assistive Device Front wheel walker;None  Distance Ambulated (ft) 60 ft  Activity Response Tolerated well  Mobility visit 1 Mobility  Mobility Specialist Start Time (ACUTE ONLY) 1320  Mobility Specialist Stop Time (ACUTE ONLY) 1329  Mobility Specialist Time Calculation (min) (ACUTE ONLY) 9 min   Pt amb within the hallway upon arrival, no AD--- utilizing RA. Pt amb to the room to retrieve the RW, educated on the importance of using the RW for safety. Pt expressed he does not need it, however agreeable to amb with it in the hallway. Pt amb ~60 ft in the hall CGA-MinG, tolerated well. Pt returned to the room, left seated EOB with needs within reach.  America Silvan Mobility Specialist 08/09/2024 2:26 PM

## 2024-08-09 NOTE — [facility] Group Notes (Signed)
 Spirituality Group   Group Goal: Support / Education around grief and loss   Group Description: Following introductions and group rules, group members engaged in facilitated group dialog and support around topic of loss, with particular support around experiences of loss in their lives. Group members identified types of loss (relationships / self / things) as well as patterns, circumstances, and changes that precipitate loss. Reflection invited on thoughts / feelings around loss, normalized grief responses, and recognized variety in grief experience. Group noted Worden's four tasks of grief in discussion. Group drew on Adlerian / Rogerian, narrative, MI, with [place] group therapy as a primary framework.   Observations: Present during group but did not participate.  Terren Haberle L. [name] HERO.Div

## 2024-08-10 NOTE — Plan of Care (Signed)
 Patient alert and oriented x1-2. Denies SI, HI, AVH. Denies pain scheduled medications administered per MAR. Support and encouragement provided.  Routine safety checks conducted every 15 minutes.  Patient informed to notify staff with problems or concerns. No adverse drug reactions noted. Patient verbally contracts for safety at this time. Patient interacts well with others on the unit.  Patient remains safe at this time.  Problem: Health Behavior/Discharge Planning: Goal: Ability to manage health-related needs will improve Outcome: Progressing   Problem: Clinical Measurements: Goal: Respiratory complications will improve Outcome: Progressing   Problem: Clinical Measurements: Goal: Cardiovascular complication will be avoided Outcome: Progressing   Problem: Activity: Goal: Risk for activity intolerance will decrease Outcome: Progressing   Problem: Nutrition: Goal: Adequate nutrition will be maintained Outcome: Progressing   Problem: Safety: Goal: Ability to remain free from injury will improve Outcome: Progressing

## 2024-08-10 NOTE — [facility] Counselor (Signed)
 CSW called [name] [phone]) to get an update on placement for pt.   CSW unable to reach, left HIPAA compliant VM requesting return call.   Lum Croft, MSW, [place] 08/10/2024 11:52 AM

## 2024-08-10 NOTE — Plan of Care (Signed)
  Problem: Education: Goal: Knowledge of General Education information will improve Description: Including pain rating scale, medication(s)/side effects and non-pharmacologic comfort measures Outcome: Not Progressing   Problem: Health Behavior/Discharge Planning: Goal: Ability to manage health-related needs will improve Outcome: Not Progressing   Problem: Education: Goal: Knowledge of General Education information will improve Description: Including pain rating scale, medication(s)/side effects and non-pharmacologic comfort measures Outcome: Not Progressing   Problem: Health Behavior/Discharge Planning: Goal: Ability to manage health-related needs will improve Outcome: Not Progressing

## 2024-08-10 NOTE — Progress Notes (Signed)
" °   08/10/24 0900  Psych Admission Type (Psych Patients Only)  Admission Status Involuntary  Psychosocial Assessment  Patient Complaints None  Eye Contact Brief  Facial Expression Flat  Affect Sullen  Speech Soft  Interaction Guarded  Motor Activity Slow  Appearance/Hygiene In scrubs  Behavior Characteristics Guarded  Mood Preoccupied  Thought Process  Coherency WDL  Content WDL  Delusions None reported or observed  Perception WDL  Hallucination None reported or observed  Judgment Impaired  Confusion Mild  Danger to Self  Current suicidal ideation? Denies  Danger to Others  Danger to Others None reported or observed    "

## 2024-08-10 NOTE — Group Note (Signed)
 Physical/Occupational Therapy Group Note  Group Topic: Pain Management and Coping   Group Date: 08/10/2024 Start Time: 1300 End Time: 1400 Facilitators: Clive Warren CROME, OT   Group Description: Group discussed impact of chronic/acute pain on safety and independence with functional tasks and impact on mental health.  Identified and discussed any previously learned or implemented strategies used.  Discussed and reviewed cognitive behavioral pain coping strategies to address/improve overall management of pain. Discussed relaxation, distraction techniques, cognitive restructuring, activity pacing/energy conservation, environment/home safety modifications, and role of sleep and sleep hygiene. Allowed time for questions and further discussion.  Therapeutic Goal(s):  Identify and discuss previously utilized pain coping strategies and implications of pain on function/well-being Identify and discuss implementing new cognitive behavioral pain coping strategies into daily routines Demonstrate understanding and performance of learned cognitive behavioral pain coping strategies  Individual Participation: Pt initially agreeable to remaining in the day room for session but then left prior to start and did not return.  Participation Level: Did not attend   Participation Quality:   Behavior:   Speech/Thought Process:   Affect/Mood:   Insight:   Judgement:   Modes of Intervention:   Patient Response to Interventions:    Plan: Continue to engage patient in PT/OT groups 1 - 2x/week.  Jessica Seidman R., MPH, MS, OTR/L ascom [phone] 08/10/2024, 3:17 PM

## 2024-08-10 NOTE — Group Note (Signed)
 Recreation Therapy Group Note   Group Topic:Communication  Group Date: 08/10/2024 Start Time: 1405 End Time: 1440 Facilitators: Celestia Jeoffrey BRAVO, LRT, CTRS Location: Courtyard  Group Description: Music. Patients are encouraged to name their favorite song(s) for LRT to play song through speaker for group to hear, while in the courtyard getting fresh air and sunlight. Patients educated on the definition of leisure and the importance of having different leisure interests outside of the hospital. Group discussed how leisure activities can often be used as pharmacologist and that listening to music and being outside are examples.    Goal Area(s) Addressed:  Patient will identify a current leisure interest.  Patient will practice making a positive decision. Patient will have the opportunity to try a new leisure activity.   Affect/Mood: N/A   Participation Level: Did not attend    Clinical Observations/Individualized Feedback: Patient did not attend.  Plan: Continue to engage patient in RT group sessions 2-3x/week.   Jeoffrey BRAVO Celestia, LRT, CTRS 08/10/2024 4:57 PM

## 2024-08-10 NOTE — Group Note (Signed)
 Date:  08/10/2024 Time:  10:49 AM  Group Topic/Focus:  Stages of Change:   The focus of this group is to explain the stages of change and help patients identify changes they want to make upon discharge.    Participation Level:  Active  Participation Quality:  Appropriate  Affect:  Appropriate  Cognitive:  Alert  Insight: Appropriate  Engagement in Group:  Engaged  Modes of Intervention:  Activity, Discussion, and Education  Additional Comments:    [name] 08/10/2024, 10:49 AM

## 2024-08-10 NOTE — Group Note (Signed)
 Date:  08/10/2024 Time:  3:35 PM  Group Topic/Focus:  Movement Therapy, Getting fit with Yazaira Speas.    Participation Level:  Did Not Attend    [name] 08/10/2024, 3:35 PM

## 2024-08-10 NOTE — Progress Notes (Signed)
 Mobility Specialist - Progress Note   08/10/24 1349  Mobility  Activity Ambulated with assistance  Level of Assistance Standby assist, set-up cues, supervision of patient - no hands on  Assistive Device Front wheel walker  Distance Ambulated (ft) 80 ft  Activity Response Tolerated well  Mobility visit 1 Mobility  Mobility Specialist Start Time (ACUTE ONLY) 1333  Mobility Specialist Stop Time (ACUTE ONLY) 1341  Mobility Specialist Time Calculation (min) (ACUTE ONLY) 8 min   Pt supine upon entry, utilizing RA. Pt agreeable to OOB amb this date, agreeable to utilize the RW. Pt amb a self selected ~ 80 ft around the hallway with close sup, tolerated well--- VC's for proper body positioning within the RW, little to no carryover. Pt returned to the room, left supine with needs within reach.   Benjamin [name] Mobility Specialist 08/10/2024 1:53 PM

## 2024-08-10 NOTE — Group Note (Signed)
 Date:  08/10/2024 Time:  10:29 PM  Group Topic/Focus:  Self Care:   The focus of this group is to help patients understand the importance of self-care in order to improve or restore emotional, physical, spiritual, interpersonal, and financial health.    Participation Level:  Active  Participation Quality:  Appropriate  Affect:  Appropriate  Cognitive:  Appropriate  Insight: Appropriate  Engagement in Group:  Engaged  Modes of Intervention:  Discussion  Additional Comments:    Benjamin [name] 08/10/2024, 10:29 PM

## 2024-08-10 NOTE — Progress Notes (Signed)
(  Sleep Hours) - 8.75 (Any PRNs that were needed, meds refused, or side effects to meds)- N/A (Any disturbances and when (visitation, over night)-N/A (Concerns raised by the patient)- N/A (SI/HI/AVH)- Denies

## 2024-08-11 NOTE — Progress Notes (Signed)
" °   08/11/24 1200  Psych Admission Type (Psych Patients Only)  Admission Status Involuntary  Psychosocial Assessment  Patient Complaints None  Eye Contact Fair  Facial Expression Flat  Affect Sullen  Speech Soft  Interaction Minimal  Motor Activity Slow  Appearance/Hygiene In scrubs  Behavior Characteristics Cooperative  Mood Preoccupied  Thought Process  Coherency WDL  Content WDL  Delusions None reported or observed  Perception WDL  Hallucination None reported or observed  Judgment Impaired  Confusion Mild    "

## 2024-08-11 NOTE — Group Note (Signed)
 Date:  08/11/2024 Time:  10:22 PM  Group Topic/Focus:  Identifying Needs:   The focus of this group is to help patients identify their personal needs that have been historically problematic and identify healthy behaviors to address their needs.    Participation Level:  Active  Participation Quality:  Appropriate  Affect:  Appropriate  Cognitive:  Appropriate  Insight: Appropriate  Engagement in Group:  Engaged  Modes of Intervention:  Discussion  Additional Comments:    Laymon [name] 08/11/2024, 10:22 PM

## 2024-08-11 NOTE — Group Note (Signed)
 Date:  08/11/2024 Time:  10:35 AM  Group Topic/Focus:  Movement Therapy Morning strech with Lyna Laningham    Participation Level:  Did Not Attend    [name] 08/11/2024, 10:35 AM

## 2024-08-11 NOTE — Progress Notes (Signed)
 SI/HI: Denies  Behavior/Mood: Alert and oriented with forgetfulness. Flat affect. Denies anxiety and depression  Interaction/Group: min interaction with peers and staff. Partial participant in group.   Medications/PRNs:po med compliant. No PRNs given  Pain:denies  Other:slept 8.75 hours   08/10/24 2100  Psych Admission Type (Psych Patients Only)  Admission Status Involuntary  Psychosocial Assessment  Patient Complaints None  Eye Contact Fair  Facial Expression Flat  Affect Sullen  Speech Soft  Interaction Minimal  Motor Activity Slow  Appearance/Hygiene In scrubs  Behavior Characteristics Cooperative  Mood Preoccupied  Thought Process  Coherency WDL  Content WDL  Delusions None reported or observed  Perception WDL  Hallucination None reported or observed  Judgment Impaired  Confusion Mild  Danger to Self  Current suicidal ideation? Denies

## 2024-08-11 NOTE — Plan of Care (Signed)
  Problem: Activity: Goal: Interest or engagement in activities will improve Outcome: Progressing Goal: Sleeping patterns will improve Outcome: Progressing   Problem: Coping: Goal: Ability to verbalize frustrations and anger appropriately will improve Outcome: Progressing Goal: Ability to demonstrate self-control will improve Outcome: Progressing   Problem: Safety: Goal: Periods of time without injury will increase Outcome: Progressing

## 2024-08-11 NOTE — Group Note (Signed)
"                                                 [facility] LCSW Group Therapy Note    Group Date: 08/11/2024 Start Time: 1328 End Time: 1358  Type of Therapy and Topic:  Group Therapy:  Overcoming Obstacles  Participation Level:  [facility] PARTICIPATION LEVEL: Did Not Attend  Mood: Not able to assess  Description of Group:   In this group patients will be encouraged to explore what they see as obstacles to their own wellness and recovery. They will be guided to discuss their thoughts, feelings, and behaviors related to these obstacles. The group will process together ways to cope with barriers, with attention given to specific choices patients can make. Each patient will be challenged to identify changes they are motivated to make in order to overcome their obstacles. This group will be process-oriented, with patients participating in exploration of their own experiences as well as giving and receiving support and challenge from other group members.  Therapeutic Goals: 1. Patient will identify personal and current obstacles as they relate to admission. 2. Patient will identify barriers that currently interfere with their wellness or overcoming obstacles.  3. Patient will identify feelings, thought process and behaviors related to these barriers. 4. Patient will identify two changes they are willing to make to overcome these obstacles:    Summary of Patient Progress The facilitator and patient discussed things they could control and not control.  Group members create an individual statement on what they could not control.  The facilitator and patient examine how different experiences can influence their  mood.  The patient reflected on how their behaviors shape their current control and things that they can not control.  This distinction allows the patient to honor their limitations and channel their efforts where they will make a difference.     Therapeutic Modalities:   Cognitive Behavioral  Therapy Solution Focused Therapy Motivational Interviewing Relapse Prevention Therapy   Rexene LELON Mae, LCSWA "

## 2024-08-11 NOTE — Progress Notes (Signed)
 Mobility Specialist Progress Note:    08/11/24 1232  Mobility  Activity Ambulated with assistance  Level of Assistance Standby assist, set-up cues, supervision of patient - no hands on  Assistive Device Front wheel walker  Distance Ambulated (ft) 350 ft  Range of Motion/Exercises Active;All extremities  Activity Response Tolerated well  Mobility visit 1 Mobility  Mobility Specialist Start Time (ACUTE ONLY) 1220  Mobility Specialist Stop Time (ACUTE ONLY) 1232  Mobility Specialist Time Calculation (min) (ACUTE ONLY) 12 min   Pt received in bed, agreeable to mobility. Require supervision to stand and ambulate with RW. Tolerated well, required VC's to maintain proper position with RW. Returned to room, all needs met.  Sherrilee Ditty Mobility Specialist Please contact via Special Educational Needs Teacher or  Rehab office at [phone]

## 2024-08-11 NOTE — Plan of Care (Signed)

## 2024-08-11 NOTE — Group Note (Signed)
 Date:  08/11/2024 Time:  4:32 PM  Group Topic/Focus:  Managing Feelings:   The focus of this group is to identify what feelings patients have difficulty handling and develop a plan to handle them in a healthier way upon discharge.    Participation Level:  Did Not Attend  Additional Comments:  Pt slept through entirety of group  Maglione,Korey Prashad E 08/11/2024, 4:32 PM
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
## 2024-08-12 NOTE — Group Note (Signed)
 Date:  08/12/2024 Time:  4:26 PM  Group Topic/Focus:  Wellness Toolbox:   The focus of this group is to discuss various aspects of wellness, balancing those aspects and exploring ways to increase the ability to experience wellness.  Patients will create a wellness toolbox for use upon discharge.    Participation Level:  Minimal  Participation Quality:  Inattentive  Affect:  Lethargic  Cognitive:  Lacking  Insight: Limited  Engagement in Group:  None and Resistant  Modes of Intervention:  Activity and Discussion  Additional Comments:     Maglione,Maezie Justin E 08/12/2024, 4:26 PM
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
## 2024-08-12 NOTE — Group Note (Signed)
 Date:  08/12/2024 Time:  11:15 PM  Group Topic/Focus:  Wrap-Up Group:   The focus of this group is to help patients review their daily goal of treatment and discuss progress on daily workbooks.    Participation Level:  Active  Participation Quality:  Appropriate  Affect:  Appropriate  Cognitive:  Alert  Insight: Appropriate  Engagement in Group:  Engaged  Modes of Intervention:  Discussion  Additional Comments:    [name] 08/12/2024, 11:15 PM

## 2024-08-12 NOTE — Plan of Care (Signed)
  Problem: Activity: Goal: Interest or engagement in activities will improve Outcome: Progressing Goal: Sleeping patterns will improve Outcome: Progressing   Problem: Coping: Goal: Ability to verbalize frustrations and anger appropriately will improve Outcome: Progressing Goal: Ability to demonstrate self-control will improve Outcome: Progressing   Problem: Safety: Goal: Periods of time without injury will increase Outcome: Progressing

## 2024-08-12 NOTE — Progress Notes (Signed)
 Mobility Specialist Progress Note:    08/12/24 1647  Mobility  Activity Ambulated with assistance  Level of Assistance Contact guard assist, steadying assist  Assistive Device Front wheel walker  Distance Ambulated (ft) 300 ft  Range of Motion/Exercises Active;All extremities  Activity Response Tolerated well  Mobility visit 1 Mobility  Mobility Specialist Start Time (ACUTE ONLY) 1616  Mobility Specialist Stop Time (ACUTE ONLY) 1629  Mobility Specialist Time Calculation (min) (ACUTE ONLY) 13 min     Benjamin [name] Mobility Specialist Please contact via Special Educational Needs Teacher or  Rehab office at [phone]

## 2024-08-12 NOTE — Plan of Care (Signed)
   Problem: Education: Goal: Knowledge of General Education information will improve Description: Including pain rating scale, medication(s)/side effects and non-pharmacologic comfort measures Outcome: Not Progressing   Problem: Health Behavior/Discharge Planning: Goal: Ability to manage health-related needs will improve Outcome: Not Progressing   Problem: Clinical Measurements: Goal: Ability to maintain clinical measurements within normal limits will improve Outcome: Not Progressing   Problem: Activity: Goal: Risk for activity intolerance will decrease Outcome: Not Progressing

## 2024-08-12 NOTE — Progress Notes (Signed)
" °   08/12/24 1100  Psych Admission Type (Psych Patients Only)  Admission Status Involuntary  Psychosocial Assessment  Patient Complaints None  Eye Contact Fair  Facial Expression Flat  Affect Sullen  Speech Soft  Interaction Minimal  Motor Activity Slow  Appearance/Hygiene In scrubs  Behavior Characteristics Cooperative  Mood Preoccupied  Thought Process  Coherency WDL  Content WDL  Delusions None reported or observed  Perception WDL  Hallucination None reported or observed  Judgment Impaired  Confusion Mild  Danger to Self  Current suicidal ideation? Denies    "

## 2024-08-12 NOTE — Progress Notes (Addendum)
 [facility] MD Progress Note  08/11/2024 8:46 AM Benjamin [name]  MRN:  [id number] Benjamin [name] is a 78 year old male who presents to the inpatient geriatric psych unit after jumping from a two story building. Patient was originally seen at [facility] who referred him to the ED who admitted him here on 03/27/2024.        He remains confused but is calm and cooperative.  He declines having any falls today or feeling unsteady on his legs.  He reports fair appetite and sleep.  Per nursing he is taking his medications with no reported side effect.  Psychiatric History: see h&P  Family History:  Family History  Problem Relation Age of Onset   Colon cancer Neg Hx    Colon polyps Neg Hx    Stomach cancer Neg Hx    Esophageal cancer Neg Hx    Social History:  Social History   Substance and Sexual Activity  Alcohol Use No     Social History   Substance and Sexual Activity  Drug Use No    Social History   Socioeconomic History   Marital status: Single    Spouse name: Not on file   Number of children: Not on file   Years of education: Not on file   Highest education level: Not on file  Occupational History   Not on file  Tobacco Use   Smoking status: Former    Types: Cigarettes   Smokeless tobacco: Never  Vaping Use   Vaping status: Never Used  Substance and Sexual Activity   Alcohol use: No   Drug use: No   Sexual activity: Not on file  Other Topics Concern   Not on file  Social History Narrative   Not on file   Social Drivers of Health   Tobacco Use: Medium Risk (03/27/2024)   Patient History    Smoking Tobacco Use: Former    Smokeless Tobacco Use: Never    Passive Exposure: Not on Actuary Strain: Not on file  Food Insecurity: No Food Insecurity (03/27/2024)   Epic    Worried About Programme Researcher, Broadcasting/film/video in the Last Year: Never true    Ran Out of Food in the Last Year: Never true  Transportation Needs: No Transportation Needs (03/27/2024)    Epic    Lack of Transportation (Medical): No    Lack of Transportation (Non-Medical): No  Physical Activity: Not on file  Stress: Not on file  Social Connections: Moderately Integrated (03/27/2024)   Social Connection and Isolation Panel    Frequency of Communication with Friends and Family: Twice a week    Frequency of Social Gatherings with Friends and Family: Once a week    Attends Religious Services: 1 to 4 times per year    Active Member of [employer] or Organizations: No    Attends Banker Meetings: 1 to 4 times per year    Marital Status: Divorced  Depression (PHQ2-9): Not on file  Alcohol Screen: Low Risk (03/27/2024)   Alcohol Screen    Last Alcohol Screening Score (AUDIT): 0  Housing: Low Risk (03/27/2024)   Epic    Unable to Pay for Housing in the Last Year: No    Number of Times Moved in the Last Year: 0    Homeless in the Last Year: No  Utilities: Not At Risk (03/27/2024)   Epic    Threatened with loss of utilities: No  Health Literacy: Not on file  Past Medical History:  Past Medical History:  Diagnosis Date   Allergy    Arthritis    back   Prostate cancer (HCC) dx'd 2010   surg only    Past Surgical History:  Procedure Laterality Date   COLONOSCOPY     EYE SURGERY     PROSTATE SURGERY      Current Medications: Current Facility-Administered Medications  Medication Dose Route Frequency Provider Last Rate Last Admin   acetaminophen  (TYLENOL ) tablet 650 mg  650 mg Oral Q6H PRN Coleman, Carolyn H, NP       alum & mag hydroxide-simeth (MAALOX/MYLANTA) 200-200-20 MG/5ML suspension 30 mL  30 mL Oral Q4H PRN Coleman, Carolyn H, NP       docusate sodium  (COLACE) capsule 100 mg  100 mg Oral Daily Jadapalle, Sree, MD   100 mg at 08/11/24 0951   fluPHENAZine  (PROLIXIN ) tablet 10 mg  10 mg Oral BID Shrivastava, Aryendra, MD   10 mg at 08/11/24 2055   magnesium  hydroxide (MILK OF MAGNESIA) suspension 30 mL  30 mL Oral Daily PRN Coleman, Carolyn H, NP        OLANZapine  (ZYPREXA ) injection 5 mg  5 mg Intramuscular TID PRN Coleman, Carolyn H, NP   5 mg at 04/12/24 2230   OLANZapine  zydis (ZYPREXA ) disintegrating tablet 5 mg  5 mg Oral TID PRN Coleman, Carolyn H, NP   5 mg at 07/22/24 0034   polyethylene glycol (MIRALAX  / GLYCOLAX ) packet 17 g  17 g Oral Daily PRN Donnelly Mellow, MD   17 g at 06/04/24 2123    Lab Results:  No results found for this or any previous visit (from the past 48 hours).      Blood Alcohol level:  Lab Results  Component Value Date   [facility] <15 03/26/2024    Metabolic Disorder Labs: Lab Results  Component Value Date   HGBA1C 5.6 04/02/2024   MPG 114 04/02/2024   No results found for: PROLACTIN Lab Results  Component Value Date   CHOL 146 04/02/2024   TRIG 37 04/02/2024   HDL 43 04/02/2024   CHOLHDL 3.4 04/02/2024   VLDL 7 04/02/2024   LDLCALC 96 04/02/2024    Physical Findings: AIMS:  , ,  ,  ,    CIWA:    COWS:      Psychiatric Specialty Exam:  Presentation  General Appearance:  Appropriate for Environment; Bizarre  Eye Contact: Fair  Speech: Normal rate  Speech Volume: Normal    Mood and Affect  Mood: Fine Affect: Congruent   Thought Process  Thought Processes: Disorganized At baseline Descriptions of Associations:Intact  Orientation:Full (Time, Place and Person)  Thought Content: Impoverished Hallucinations: Denies  Ideas of Reference: denies Suicidal Thoughts: Denies  Homicidal Thoughts: Denies   Sensorium  Memory: impiared  Judgment: Impaired  Insight: Community Education Officer  Concentration: Fair  Attention Span: Fair  Recall: [employer] of Knowledge: Fair  Language: Fair   Psychomotor Activity  Psychomotor Activity: No data recorded  Musculoskeletal: Strength & Muscle Tone: within normal limits Gait & Station: normal Assets  Assets: Manufacturing Systems Engineer; Resilience    Physical Exam: Physical Exam Vitals and nursing  note reviewed.    ROS Blood pressure 97/67, pulse 64, temperature 98.2 F (36.8 C), resp. rate 18, height 6' (1.829 m), weight 100 kg, SpO2 98%. Body mass index is 29.91 kg/m.  Diagnosis: Principal Problem:   Delusional disorder (HCC) Active Problems:   Laceration of left eyebrow  Major neurocognitive disorder [facility])  Major Neurocognitive disorder  Clinical Decision Making: Patient currently admitted after jumping off a two-story building in the context of possible delusions as reported as the people in the house he lived for 4 years trying to kill him.  Patient needs to be monitored closely for ongoing psychosis and paranoid delusions.  Given patient's confusion and chronic paranoia, no family support, APS has taken up guardianship and looking for placement     Safety and Monitoring:             -- InVoluntary admission to inpatient psychiatric unit for safety, stabilization and treatment             -- Daily contact with patient to assess and evaluate symptoms and progress in treatment             -- Patient's case to be discussed in multi-disciplinary team meeting             -- Observation Level: q15 minute checks             -- Vital signs:  q12 hours             -- Precautions: suicide, elopement, and assault   2. Psychiatric Diagnoses and Treatment:              Continue Prolixin  to 10 mg twice daily.  -- The risks/benefits/side-effects/alternatives to this medication were discussed in detail with the patient and time was given for questions. The patient consents to medication trial.                -- Metabolic profile and EKG monitoring obtained while on an atypical antipsychotic (BMI: Lipid Panel: HbgA1c: QTc:)              -- Encouraged patient to participate in unit milieu and in scheduled group therapies                            3. Medical Issues Being Addressed:    Fall on/8/25, CT head normal  4. Discharge Planning:   -- Social work and case management to  assist with discharge planning and identification of hospital follow-up needs prior to discharge  -- Estimated LOS: 3-4 days  Millie JONELLE Manners, MD 08/12/2024, 8:46 AM

## 2024-08-12 NOTE — Progress Notes (Signed)
" °   08/11/24 2100  Psych Admission Type (Psych Patients Only)  Admission Status Involuntary  Psychosocial Assessment  Patient Complaints None  Eye Contact Fair  Facial Expression Flat  Affect Sullen  Speech Soft  Interaction Minimal  Motor Activity Slow  Appearance/Hygiene In scrubs  Behavior Characteristics Cooperative  Mood Preoccupied  Thought Process  Coherency WDL  Content WDL  Delusions None reported or observed  Perception WDL  Hallucination None reported or observed  Judgment Impaired  Confusion Mild  Danger to Self  Current suicidal ideation? Denies    "

## 2024-08-12 NOTE — Group Note (Signed)
 Date:  08/12/2024 Time:  10:37 AM  Group Topic/Focus:  Movement Therapy, Morning Stretch with Reina Wilton.    Participation Level:  Did Not Attend    Benjamin [name] 08/12/2024, 10:37 AM

## 2024-08-13 DIAGNOSIS — F22 Delusional disorders: Secondary | ICD-10-CM | POA: Diagnosis not present

## 2024-08-13 NOTE — Group Note (Signed)
 Recreation Therapy Group Note   Group Topic:Healthy Support Systems  Group Date: 08/13/2024 Start Time: 1400 End Time: 1435 Facilitators: Celestia Jeoffrey BRAVO, LRT, CTRS Location: Dayroom  Group Description: Straw Bridge. In groups or individually, patients were given 10 plastic drinking straws and an equal length of masking tape. Using the materials provided, patients were instructed to build a free-standing bridge-like structure to suspend an everyday item (ex: deck of cards) off the floor or table surface. All materials were required to be used in secondary school teacher. LRT facilitated post-activity discussion reviewing the importance of having strong and healthy support systems in our lives. LRT discussed how the people in our lives serve as the tape and the deck of cards we placed on top of our straw structure are the stressors we face in daily life. LRT and pts discussed what happens in our life when things get too heavy for us , and we don't have strong supports outside of the hospital. Pt shared 2 of their healthy supports in their life aloud in the group.   Goal Area(s) Addressed:  Patient will identify 2 healthy supports in their life. Patient will identify skills to successfully complete activity. Patient will identify correlation of this activity to life post-discharge.  Patient will build on frustration tolerance skills. Patient will increase team building and communication skills.     Affect/Mood: N/A   Participation Level: Did not attend    Clinical Observations/Individualized Feedback: Patient did not attend.  Plan: Continue to engage patient in RT group sessions 2-3x/week.   Jeoffrey BRAVO Celestia, LRT, CTRS 08/13/2024 4:45 PM

## 2024-08-13 NOTE — Progress Notes (Signed)
 [facility] MD Progress Note  08/13/2024 12:43 PM Benjamin [name]  MRN:  [id number] Benjamin [name] is a 78 year old male who presents to the inpatient geriatric psych unit after jumping from a two story building. Patient was originally seen at [facility] who referred him to the ED who admitted him here on 03/27/2024. Patient reports that people from the [place] gang were trying to kill him and the only escape was through the bedroom window on the second floor. He currently lives in the house with Benjamin [name] and his family and has been living with them for 3-4 years without any problems. On 03/19/2024 he went to the doctor and when he got back home the family wouldn't let him leave. He states that after he realized the gang was going to kill him he barricaded the door with his bed and jumped out the window. When he landed he ran away until one of his neighbors found him and called 911. Patient is admitted to [facility] unit with Q15 min safety monitoring. Multidisciplinary team approach is offered. Medication management; group/milieu therapy is offered.     Subjective: Chart is reviewed and discussed with the treatment team.  APS is working on legal guardianship and placement.  He has guardianship hearing June 19, 2024   Patient is noted to be resting in bed.  He initially did not respond to the questions but was able to wake up with verbal commands.  He offers no complaints.  He reports fair appetite and sleep and is reported by the nurse to be taking medications. Psychiatric History: see h&P  Family History:  Family History  Problem Relation Age of Onset   Colon cancer Neg Hx    Colon polyps Neg Hx    Stomach cancer Neg Hx    Esophageal cancer Neg Hx    Social History:  Social History   Substance and Sexual Activity  Alcohol Use No     Social History   Substance and Sexual Activity  Drug Use No    Social History   Socioeconomic History   Marital status: Single     Spouse name: Not on file   Number of children: Not on file   Years of education: Not on file   Highest education level: Not on file  Occupational History   Not on file  Tobacco Use   Smoking status: Former    Types: Cigarettes   Smokeless tobacco: Never  Vaping Use   Vaping status: Never Used  Substance and Sexual Activity   Alcohol use: No   Drug use: No   Sexual activity: Not on file  Other Topics Concern   Not on file  Social History Narrative   Not on file   Social Drivers of Health   Tobacco Use: Medium Risk (03/27/2024)   Patient History    Smoking Tobacco Use: Former    Smokeless Tobacco Use: Never    Passive Exposure: Not on Actuary Strain: Not on file  Food Insecurity: No Food Insecurity (03/27/2024)   Epic    Worried About Programme Researcher, Broadcasting/film/video in the Last Year: Never true    Ran Out of Food in the Last Year: Never true  Transportation Needs: No Transportation Needs (03/27/2024)   Epic    Lack of Transportation (Medical): No    Lack of Transportation (Non-Medical): No  Physical Activity: Not on file  Stress: Not on file  Social Connections: Moderately Integrated (03/27/2024)   Social Connection and  Isolation Panel    Frequency of Communication with Friends and Family: Twice a week    Frequency of Social Gatherings with Friends and Family: Once a week    Attends Religious Services: 1 to 4 times per year    Active Member of [employer] or Organizations: No    Attends Banker Meetings: 1 to 4 times per year    Marital Status: Divorced  Depression (PHQ2-9): Not on file  Alcohol Screen: Low Risk (03/27/2024)   Alcohol Screen    Last Alcohol Screening Score (AUDIT): 0  Housing: Low Risk (03/27/2024)   Epic    Unable to Pay for Housing in the Last Year: No    Number of Times Moved in the Last Year: 0    Homeless in the Last Year: No  Utilities: Not At Risk (03/27/2024)   Epic    Threatened with loss of utilities: No  Health Literacy: Not on file    Past Medical History:  Past Medical History:  Diagnosis Date   Allergy    Arthritis    back   Prostate cancer (HCC) dx'd 2010   surg only    Past Surgical History:  Procedure Laterality Date   COLONOSCOPY     EYE SURGERY     PROSTATE SURGERY      Current Medications: Current Facility-Administered Medications  Medication Dose Route Frequency Provider Last Rate Last Admin   acetaminophen  (TYLENOL ) tablet 650 mg  650 mg Oral Q6H PRN Coleman, Carolyn H, NP       alum & mag hydroxide-simeth (MAALOX/MYLANTA) 200-200-20 MG/5ML suspension 30 mL  30 mL Oral Q4H PRN Coleman, Carolyn H, NP       docusate sodium  (COLACE) capsule 100 mg  100 mg Oral Daily Giavanni Zeitlin, MD   100 mg at 08/13/24 1017   fluPHENAZine  (PROLIXIN ) tablet 10 mg  10 mg Oral BID Shrivastava, Aryendra, MD   10 mg at 08/13/24 1017   magnesium  hydroxide (MILK OF MAGNESIA) suspension 30 mL  30 mL Oral Daily PRN Mardy Elveria DEL, NP       OLANZapine  (ZYPREXA ) injection 5 mg  5 mg Intramuscular TID PRN Mardy Elveria DEL, NP   5 mg at 04/12/24 2230   OLANZapine  zydis (ZYPREXA ) disintegrating tablet 5 mg  5 mg Oral TID PRN Coleman, Carolyn H, NP   5 mg at 07/22/24 0034   polyethylene glycol (MIRALAX  / GLYCOLAX ) packet 17 g  17 g Oral Daily PRN Donnelly Mellow, MD   17 g at 06/04/24 2123    Lab Results:  No results found for this or any previous visit (from the past 48 hours).      Blood Alcohol level:  Lab Results  Component Value Date   [facility] <15 03/26/2024    Metabolic Disorder Labs: Lab Results  Component Value Date   HGBA1C 5.6 04/02/2024   MPG 114 04/02/2024   No results found for: PROLACTIN Lab Results  Component Value Date   CHOL 146 04/02/2024   TRIG 37 04/02/2024   HDL 43 04/02/2024   CHOLHDL 3.4 04/02/2024   VLDL 7 04/02/2024   LDLCALC 96 04/02/2024    Physical Findings: AIMS:  , ,  ,  ,    CIWA:    COWS:      Psychiatric Specialty Exam:  Presentation  General Appearance:   Appropriate for Environment; Bizarre  Eye Contact: Fair  Speech: Normal rate  Speech Volume: Normal    Mood and Affect  Mood: Fine  Affect: Congruent   Thought Process  Thought Processes: Disorganized At baseline Descriptions of Associations:Intact  Orientation:Full (Time, Place and Person)  Thought Content: Impoverished Hallucinations: Denies  Ideas of Reference: denies Suicidal Thoughts: Denies  Homicidal Thoughts: Denies   Sensorium  Memory: impiared  Judgment: Impaired  Insight: Shallow   Executive Functions  Concentration: Fair  Attention Span: Fair  Recall: [employer] of Knowledge: Fair  Language: Fair   Psychomotor Activity  Psychomotor Activity: No data recorded  Musculoskeletal: Strength & Muscle Tone: within normal limits Gait & Station: normal Assets  Assets: Manufacturing Systems Engineer; Resilience    Physical Exam: Physical Exam Vitals and nursing note reviewed.    ROS Blood pressure 130/77, pulse 68, temperature 98.2 F (36.8 C), resp. rate 18, height 6' (1.829 m), weight 100 kg, SpO2 99%. Body mass index is 29.91 kg/m.  Diagnosis: Principal Problem:   Delusional disorder (HCC) Active Problems:   Laceration of left eyebrow   Major neurocognitive disorder [facility])  Major Neurocognitive disorder  Clinical Decision Making: Patient currently admitted after jumping off a two-story building in the context of possible delusions as reported as the people in the house he lived for 4 years trying to kill him.  Patient needs to be monitored closely for ongoing psychosis and paranoid delusions.  Given patient's confusion and chronic paranoia, no family support, APS has taken up guardianship and looking for placement     Safety and Monitoring:             -- InVoluntary admission to inpatient psychiatric unit for safety, stabilization and treatment             -- Daily contact with patient to assess and evaluate symptoms and  progress in treatment             -- Patient's case to be discussed in multi-disciplinary team meeting             -- Observation Level: q15 minute checks             -- Vital signs:  q12 hours             -- Precautions: suicide, elopement, and assault   2. Psychiatric Diagnoses and Treatment:              Continue Prolixin  to 10 mg twice daily.  -- The risks/benefits/side-effects/alternatives to this medication were discussed in detail with the patient and time was given for questions. The patient consents to medication trial.                -- Metabolic profile and EKG monitoring obtained while on an atypical antipsychotic (BMI: Lipid Panel: HbgA1c: QTc:)              -- Encouraged patient to participate in unit milieu and in scheduled group therapies                            3. Medical Issues Being Addressed:    Fall on/8/25, CT head normal  4. Discharge Planning:   -- Social work and case management to assist with discharge planning and identification of hospital follow-up needs prior to discharge  -- Estimated LOS: 3-4 days  Allyn Foil, MD 08/13/2024, 12:43 PM

## 2024-08-13 NOTE — Group Note (Signed)
 Physical/Occupational Therapy Group Note  Group Topic: Transfer Training   Group Date: 08/13/2024 Start Time: 1315 End Time: 1340 Facilitators: Omarii Scalzo, Alm Hamilton, PT   Group Description: Group educated on sequence and techniques to maximize safety with functional transfers.  Additionally, integrated education on impact of seating surfaces, use of assistive device and management of orthostasis with movement transitions.  Patients actively engaged with functional transfers (sit/stand) from various seating surfaces, with and without assist devices, working to integrate and retain education provided during session.  Allowed time for questions and further discussion on mobility concerns/needs.   Therapeutic Goal(s):  Identify and demonstrate safe technique for sit/stand transfers from various seating surfaces. Identify and demonstrate safe use of assistive devices with basic transfers and simple mobility. Identify and demonstrate ability to recognize signs/symptoms of orthostasis and appropriate compensatory/safety techniques.  Individual Participation: Did not attend   Participation Level:   Participation Quality:   Behavior:   Speech/Thought Process:   Affect/Mood:   Insight:   Judgement:   Individualization:   Modes of Intervention:   Patient Response to Interventions:    Plan: Continue to engage patient in PT/OT groups 1 - 2x/week.  CHARM Hamilton Bertin PT, DPT 08/13/2024, 1:58 PM

## 2024-08-13 NOTE — Plan of Care (Signed)
   Problem: Coping: Goal: Ability to demonstrate self-control will improve Outcome: Progressing   Problem: Safety: Goal: Periods of time without injury will increase Outcome: Progressing

## 2024-08-13 NOTE — Progress Notes (Signed)
 Mobility Specialist Progress Note:    08/13/24 1721  Mobility  Activity Ambulated with assistance  Level of Assistance Contact guard assist, steadying assist  Assistive Device Front wheel walker  Distance Ambulated (ft) 200 ft  Range of Motion/Exercises Active;All extremities  Activity Response Tolerated well  Mobility visit 1 Mobility  Mobility Specialist Start Time (ACUTE ONLY) 1711  Mobility Specialist Stop Time (ACUTE ONLY) 1721  Mobility Specialist Time Calculation (min) (ACUTE ONLY) 10 min   Pt received in day room, agreeable to mobility. Required CGA to stand and ambulate with RW. Tolerated well, requires verbal cues for correct positioning with RW. Returned pt to day room, all needs met.  Sherrilee Ditty Mobility Specialist Please contact via Special Educational Needs Teacher or  Rehab office at [phone]

## 2024-08-13 NOTE — Progress Notes (Signed)
 SI/HI/AVH: denies all  Behavior/Mood: appropriate to situation/sullen   Interaction/Group attendance: minimal/ 1 of 4 groups   Medication/PRNs: compliant/ no prns    Pain: denies   Other: patient sustained a fall on 12.17.25. No injuries noted.

## 2024-08-13 NOTE — Group Note (Signed)
 Date:  08/13/2024 Time:  2:12 PM  Group Topic/Focus:  Healthy Communication:   The focus of this group is to discuss communication, barriers to communication, as well as healthy ways to communicate with others. Making Healthy Choices:   The focus of this group is to help patients identify negative/unhealthy choices they were using prior to admission and identify positive/healthier coping strategies to replace them upon discharge. Managing Feelings:   The focus of this group is to identify what feelings patients have difficulty handling and develop a plan to handle them in a healthier way upon discharge. Wellness Toolbox:   The focus of this group is to discuss various aspects of wellness, balancing those aspects and exploring ways to increase the ability to experience wellness.  Patients will create a wellness toolbox for use upon discharge.  The group completed worksheets focused on expressing how each patient was feeling, followed by a positive Would You Rather activity. Patients also participated in a Complete the Sentence exercise, a simple crossword, and brain teasers to encourage interaction and engagement. The group discussed healthy ways to cope with daily stressors and practiced sharing coping strategies.  Participation Level:  Minimal  Participation Quality:  Drowsy  Affect:  Appropriate  Cognitive:  Appropriate and Confused  Insight: Limited  Engagement in Group:  Limited  Modes of Intervention:  Activity and Discussion  Additional Comments:    Benjamin [name] Benjamin [name] 08/13/2024, 2:12 PM

## 2024-08-13 NOTE — Plan of Care (Signed)
" °  Problem: Pain Managment: Goal: General experience of comfort will improve and/or be controlled Outcome: Progressing   Problem: Coping: Goal: Ability to demonstrate self-control will improve Outcome: Progressing   Problem: Health Behavior/Discharge Planning: Goal: Compliance with treatment plan for underlying cause of condition will improve Outcome: Progressing   Problem: Safety: Goal: Periods of time without injury will increase Outcome: Progressing   "

## 2024-08-13 NOTE — Group Note (Signed)
 Date:  08/13/2024 Time:  4:53 PM  Group Topic/Focus:  Building Self Esteem:   The Focus of this group is helping patients become aware of the effects of self-esteem on their lives, the things they and others do that enhance or undermine their self-esteem, seeing the relationship between their level of self-esteem and the choices they make and learning ways to enhance self-esteem.    Participation Level:  Did Not Attend  Arland Nutting 08/13/2024, 4:53 PM

## 2024-08-14 DIAGNOSIS — F22 Delusional disorders: Secondary | ICD-10-CM | POA: Diagnosis not present

## 2024-08-14 NOTE — Progress Notes (Signed)
 SI/HI/AVH: denies all   Behavior/Mood: appropriate to situation/sullen    Interaction/Group attendance: minimal/ 0 of 4 groups   Medication/PRNs: compliant/ no prns    Pain: denies    Other: patient sustained a fall in the dayroom on 12.17.25. No injuries noted.  Patient deemed inappropriate for group home placement

## 2024-08-14 NOTE — Group Note (Signed)
 LCSW Group Therapy Note   Group Date: 08/14/2024 Start Time: 1300 End Time: 1330   Type of Therapy and Topic:  Group Therapy: Challenging Core Beliefs  Participation Level:  None  Description of Group:  Patients were educated about core beliefs and asked to identify one harmful core belief that they have. Patients were asked to explore from where those beliefs originate. Patients were asked to discuss how those beliefs make them feel and the resulting behaviors of those beliefs. They were then be asked if those beliefs are true and, if so, what evidence they have to support them. Lastly, group members were challenged to replace those negative core beliefs with helpful beliefs.   Therapeutic Goals:   1. Patient will identify harmful core beliefs and explore the origins of such beliefs. 2. Patient will identify feelings and behaviors that result from those core beliefs. 3. Patient will discuss whether such beliefs are true. 4.  Patient will replace harmful core beliefs with helpful ones.  Summary of Patient Progress:  Pt slept throughout group  Therapeutic Modalities: Cognitive Behavioral Therapy; Solution-Focused Therapy   Dawit Tankard D Dolph Tavano, [place] 08/14/2024  2:04 PM

## 2024-08-14 NOTE — Group Note (Signed)
 Date:  08/14/2024 Time:  2:50 PM  Group Topic/Focus:  Early Warning Signs:   The focus of this group is to help patients identify signs or symptoms they exhibit before slipping into an unhealthy state or crisis.    Participation Level:  Did Not Attend   Benjamin [name] 08/14/2024, 2:50 PM

## 2024-08-14 NOTE — Group Note (Signed)
 Recreation Therapy Group Note   Group Topic:Leisure Education  Group Date: 08/14/2024 Start Time: 1400 End Time: 1450 Facilitators: Celestia Jeoffrey BRAVO, LRT, CTRS Location: Courtyard  Group Description: Music. Patients are encouraged to name their favorite song(s) for LRT to play song through speaker for group to hear, while in the courtyard getting fresh air and sunlight. Patients educated on the definition of leisure and the importance of having different leisure interests outside of the hospital. Group discussed how leisure activities can often be used as pharmacologist and that listening to music and being outside are examples.    Goal Area(s) Addressed:  Patient will identify a current leisure interest.  Patient will practice making a positive decision. Patient will have the opportunity to try a new leisure activity.   Affect/Mood: N/A   Participation Level: Did not attend    Clinical Observations/Individualized Feedback: Patient did not attend.  Plan: Continue to engage patient in RT group sessions 2-3x/week.   Jeoffrey BRAVO Celestia, LRT, CTRS 08/14/2024 4:32 PM

## 2024-08-14 NOTE — Progress Notes (Signed)
" °   08/13/24 2340  Psych Admission Type (Psych Patients Only)  Admission Status Involuntary  Psychosocial Assessment  Patient Complaints None  Eye Contact Fair  Facial Expression Flat  Affect Sullen  Speech Soft  Interaction Minimal  Motor Activity Slow  Appearance/Hygiene In scrubs  Behavior Characteristics Appropriate to situation  Mood Sullen  Thought Process  Coherency WDL  Content WDL  Delusions None reported or observed  Perception WDL  Hallucination None reported or observed  Judgment Impaired  Confusion Moderate  Danger to Self  Current suicidal ideation? Denies  Danger to Others  Danger to Others None reported or observed     Estimated Sleeping Duration (Last 24 Hours): 7.75-10.75 hours  "

## 2024-08-14 NOTE — BH IP Treatment Plan (Signed)
 Interdisciplinary Treatment and Diagnostic Plan Update  08/14/2024 Time of Session: 2:00 PM  Benjamin [name] MRN: [id number]  Principal Diagnosis: Delusional disorder [facility])  Secondary Diagnoses: Principal Problem:   Delusional disorder (HCC) Active Problems:   Laceration of left eyebrow   Major neurocognitive disorder (HCC)   Current Medications:  Current Facility-Administered Medications  Medication Dose Route Frequency Provider Last Rate Last Admin   acetaminophen  (TYLENOL ) tablet 650 mg  650 mg Oral Q6H PRN Coleman, Carolyn H, NP       alum & mag hydroxide-simeth (MAALOX/MYLANTA) 200-200-20 MG/5ML suspension 30 mL  30 mL Oral Q4H PRN Coleman, Carolyn H, NP       docusate sodium  (COLACE) capsule 100 mg  100 mg Oral Daily Jadapalle, Sree, MD   100 mg at 08/14/24 9193   fluPHENAZine  (PROLIXIN ) tablet 10 mg  10 mg Oral BID Shrivastava, Aryendra, MD   10 mg at 08/14/24 9193   magnesium  hydroxide (MILK OF MAGNESIA) suspension 30 mL  30 mL Oral Daily PRN Coleman, Carolyn H, NP       OLANZapine  (ZYPREXA ) injection 5 mg  5 mg Intramuscular TID PRN Coleman, Carolyn H, NP   5 mg at 04/12/24 2230   OLANZapine  zydis (ZYPREXA ) disintegrating tablet 5 mg  5 mg Oral TID PRN Coleman, Carolyn H, NP   5 mg at 07/22/24 0034   polyethylene glycol (MIRALAX  / GLYCOLAX ) packet 17 g  17 g Oral Daily PRN Donnelly Mellow, MD   17 g at 06/04/24 2123   PTA Medications: Medications Prior to Admission  Medication Sig Dispense Refill Last Dose/Taking   docusate sodium  (COLACE) 100 MG capsule Take 100 mg by mouth daily.      omega-3 acid ethyl esters (LOVAZA) 1 g capsule Take 1 g by mouth daily.       Patient Stressors: Traumatic event    Patient Strengths: Communication skills   Treatment Modalities: Medication Management, Group therapy, Case management,  1 to 1 session with clinician, Psychoeducation, Recreational therapy.   Physician Treatment Plan for Primary Diagnosis: Delusional disorder  [facility]) Long Term Goal(s): Improvement in symptoms so as ready for discharge   Short Term Goals: Ability to identify changes in lifestyle to reduce recurrence of condition will improve Ability to verbalize feelings will improve Ability to disclose and discuss suicidal ideas Ability to demonstrate self-control will improve Ability to identify and develop effective coping behaviors will improve  Medication Management: Evaluate patient's response, side effects, and tolerance of medication regimen.  Therapeutic Interventions: 1 to 1 sessions, Unit Group sessions and Medication administration.  Evaluation of Outcomes: Not Progressing  Physician Treatment Plan for Secondary Diagnosis: Principal Problem:   Delusional disorder (HCC) Active Problems:   Laceration of left eyebrow   Major neurocognitive disorder (HCC)  Long Term Goal(s): Improvement in symptoms so as ready for discharge   Short Term Goals: Ability to identify changes in lifestyle to reduce recurrence of condition will improve Ability to verbalize feelings will improve Ability to disclose and discuss suicidal ideas Ability to demonstrate self-control will improve Ability to identify and develop effective coping behaviors will improve     Medication Management: Evaluate patient's response, side effects, and tolerance of medication regimen.  Therapeutic Interventions: 1 to 1 sessions, Unit Group sessions and Medication administration.  Evaluation of Outcomes: Not Progressing   RN Treatment Plan for Primary Diagnosis: Delusional disorder [name]) Long Term Goal(s): Knowledge of disease and therapeutic regimen to maintain health will improve  Short Term Goals: Ability to remain free  from injury will improve, Ability to verbalize frustration and anger appropriately will improve, Ability to demonstrate self-control, Ability to participate in decision making will improve, Ability to verbalize feelings will improve, Ability to disclose  and discuss suicidal ideas, Ability to identify and develop effective coping behaviors will improve, and Compliance with prescribed medications will improve  Medication Management: RN will administer medications as ordered by provider, will assess and evaluate patient's response and provide education to patient for prescribed medication. RN will report any adverse and/or side effects to prescribing provider.  Therapeutic Interventions: 1 on 1 counseling sessions, Psychoeducation, Medication administration, Evaluate responses to treatment, Monitor vital signs and CBGs as ordered, Perform/monitor CIWA, COWS, AIMS and Fall Risk screenings as ordered, Perform wound care treatments as ordered.  Evaluation of Outcomes: Not Progressing   LCSW Treatment Plan for Primary Diagnosis: Delusional disorder [facility]) Long Term Goal(s): Safe transition to appropriate next level of care at discharge, Engage patient in therapeutic group addressing interpersonal concerns.  Short Term Goals: Engage patient in aftercare planning with referrals and resources, Increase social support, Increase ability to appropriately verbalize feelings, Increase emotional regulation, Facilitate acceptance of mental health diagnosis and concerns, Facilitate patient progression through stages of change regarding substance use diagnoses and concerns, Identify triggers associated with mental health/substance abuse issues, and Increase skills for wellness and recovery  Therapeutic Interventions: Assess for all discharge needs, 1 to 1 time with Social worker, Explore available resources and support systems, Assess for adequacy in community support network, Educate family and significant other(s) on suicide prevention, Complete Psychosocial Assessment, Interpersonal group therapy.  Evaluation of Outcomes: Not Progressing   Progress in Treatment: Attending groups: Yes.  Participating in groups: No.  Taking medication as prescribed:  Yes. Toleration medication: Yes.  Family/Significant other contact made: [name], brother, [phone]  Patient understands diagnosis: No.   Discussing patient identified problems/goals with staff: Yes.  Medical problems stabilized or resolved: Yes.  Denies suicidal/homicidal ideation: Yes.  Issues/concerns per patient self-inventory: No.  Other: None    New problem(s) identified:  No, Describe:  05/14/24 Update: None  05/24/24 Update: No changes at this time.  Update 05/29/2024:  No changes at this time.  Update 06/03/2024:  No changes at this time. Update 06/08/24: No changes at this time Update 06/13/24: No changes at this time  Update 06/18/24: No changes at this time Update 06/23/24: No changes at this time  Update 06/28/24: No changes at this time Update 07/03/24: No changes at this time Update 07/09/24: No changes at this time Update 07/14/24: No changes at this time  Update 07/19/24: No changes at this time Update 07/25/24: No changes at this time Update 07/30/24: No changes at this time Update:08/04/24: No changes at this time. 08/09/24 Update: No changes at this time. Update 08/14/24: No changes at this time    New Short Term/Long Term Goal(s): elimination of symptoms of psychosis, medication management for mood stabilization; elimination of SI thoughts; development of comprehensive mental wellness plan. Update 04/02/24: No changes at this time. Update 04/07/24: No changes at this time. Update 04/12/24: No changes at this time Update 04/18/24: No changes at this time  Update 04/23/24: No changes at this time. Update 04/28/2024: No changes at this time.  Update 05/04/2024: No changes at this time.  Update 05/09/2024:  No changes at this time. 05/14/24 Update: No changes at this time. 05/19/24 Update: No changes at this time. 05/24/24 Update: No changes at this time. Update 05/29/2024:  No changes at this time.  Update 06/03/2024:  No changes at this time. Update 06/08/24: No changes at this time  Update 06/13/24: No changes at this time Update 06/18/24: No changes at this time Update 06/23/24: No changes at this time Update 06/28/24: No changes at this time  Update 07/03/24: No changes at this time Update 07/09/24: No changes at this time Update 07/14/24: No changes at this time Update 07/19/24: No changes at this time Update 07/25/24: No changes at this time Update 07/30/24: No changes at this time Update 08/04/24: No changes at this time. 08/09/24 Update: No changes at this time. Update 08/14/24: No changes at this time    Patient Goals:   Get out of here, I want to get healed up so I can get out of here Update 04/02/24: No changes at this time. Update 04/07/24: No changes at this time .Update 04/12/24: No changes at this time Update 04/18/24: No changes at this time  Update 04/23/24: No changes at this time. Update 04/28/2024: No changes at this time. Update 05/04/2024: No changes at this time.   Update 05/29/2024:  No changes at this time.  Update 06/03/2024:  No changes at this time. Update 06/08/24: No changes at this time Update 06/13/24: No changes at this time Update 06/18/24: No changes at this time Update 06/23/24: No changes at this time Update 06/28/24: No changes at this time  Update 07/03/24: No changes at this time Update 07/09/24: No changes at this time  Update 07/14/24: No changes at this time Update 07/19/24: No changes at this time Update 07/25/24: No changes at this time Update 07/30/24: No changes at this time Update 08/04/24 No changes at this time. 08/09/24 Update: No changes at this time. Update 08/14/24: No changes at this time    Discharge Plan or Barriers: CSW will assist with appropriate discharge planning  Update 04/02/24: No changes at this time. Update 04/07/24: No changes at this time.Update 04/12/24: No changes at this time Update 04/18/24: CSW submitted report to APS. Care team looking at guardianship for pt at this time  Update 04/23/24: No changes at this time. Update 04/28/2024: No  changes at this time. Update 05/04/2024: No changes at this time.  Update 05/09/2024:  [facility] APS continues to search for placement. 05/14/24 Update: [facility] APS continues to look for placement. CSW to continue to assess. 05/19/24 Update: No changes at this time. 05/24/24 Update:DSS continues to look for placement at this time Update 05/29/2024:  Patient remains on the unit and safe at this time.  Case was accepted by APS [facility] and they are looking for placement for the patient.   Update 06/03/2024:  No changes at this time. Update 06/08/24: No changes at this time Update 06/13/24: [facility] APS having difficulty connecting with pt's care coordinator at [facility] to assist with placement Update 06/18/24: No changes at this time Update 06/23/24: [facility] reports that she may have found placement for pt. CSW will send TB tests per her request Update 06/28/24: Arch reports pt currently awaiting approval of [facility]. Reports she should know more about placement next week.  Update 07/03/24: No changes at this time Update 07/09/24: Pt still pending placement according to APS. Pt's 1915[facility] is also still pending according to APS. Update 07/14/24: No changes at this time Update 07/19/24: No changes at this time Update 07/25/24: No changes at this time Update 07/30/24: According to APS, [facility] has been approved and they will move forward with placement. APS also reports pt's CCA  is pending through [employer] and that should be completed today as well should current placement option fall through. 08/04/24 No changes at this time. 08/09/24 Update: Updated FL2 sent to [name] with DSS for skilled nursing and assisted living centers following changes in patient's disposition. Update 08/14/24: No changes at this time    Reason for Continuation of Hospitalization: Delusions  Medication stabilization   Estimated Length of Stay:Estimated Length of Stay: 1 to 7 days Update 04/02/24: TBD. Update  04/07/24: TBD Update 04/12/24:TBD. Update 04/28/24:TBD Update 05/04/2024: TBD Update 05/09/2024:  TBD  05/14/24 Update: TBD. 05/19/24 Update: TBD. 05/24/24 Update: TBD  Update 05/29/2024:  TBD.  Update 06/03/2024:  TBD Update 06/08/24: TBD Update 06/13/24: TBD Update 06/18/24: TBD Update 06/23/24: TBD Update 06/28/24: TBD Update 07/03/24: TBD Update 07/09/24: TBD  Update 07/14/24: TBD Update 07/19/24: TBD Update 07/25/24: TBD Update 07/30/24: TBD  08/04/24: TBD 08/09/24 Update: TBD. Update 08/14/24: TBD    Last 3 [place] Suicide Severity Risk Score: Flowsheet Row Admission (Current) from 03/27/2024 in [facility] BEHAVIORAL MEDICINE ED from 03/26/2024 in [facility] Emergency Department at [facility] ED from 03/25/2024 in [facility]  C-SSRS RISK CATEGORY No Risk No Risk No Risk    Last [facility] 2/9 Scores:     No data to display          Scribe for Treatment Team: Lum JONETTA Raynaldo ISRAEL 08/14/2024 3:23 PM

## 2024-08-14 NOTE — Group Note (Signed)
 Date:  08/14/2024 Time:  4:28 PM  Group Topic/Focus:    Coloring offers geriatric patients significant benefits, including reducing stress and anxiety, improving fine motor skills (dexterity, hand-eye coordination), boosting cognitive function (focus, memory), providing creative self-expression, fighting boredom, and encouraging social interaction, all while fostering a sense of accomplishment and combating loneliness, especially for those with dementia or limited mobility.  Coloring induces a meditative state, channeling focus and interrupting negative thought patterns, which lowers anxiety and calms the mind.It can boost serotonin levels, leading to better moods, and help seniors forget aches and pains temporarily.  Participation Level:  Did Not Attend   Benjamin [name] 08/14/2024, 4:28 PM

## 2024-08-14 NOTE — Progress Notes (Signed)
 Mobility Specialist Progress Note:    08/14/24 1520  Mobility  Activity Ambulated with assistance  Level of Assistance Contact guard assist, steadying assist  Assistive Device Front wheel walker  Distance Ambulated (ft) 350 ft  Range of Motion/Exercises Active;All extremities  Activity Response Tolerated well  Mobility visit 1 Mobility  Mobility Specialist Start Time (ACUTE ONLY) 1454  Mobility Specialist Stop Time (ACUTE ONLY) 1506  Mobility Specialist Time Calculation (min) (ACUTE ONLY) 12 min   Pt received sitting EOB, pleasant and agreeable to mobility. Required CGA/SBA to stand and ambulate with RW. Tolerated well, requires verbal cues for correct positioning with RW. Left pt seated in day room, all needs met.  Sherrilee Ditty Mobility Specialist Please contact via Special Educational Needs Teacher or  Rehab office at [phone]

## 2024-08-14 NOTE — Plan of Care (Signed)
  Problem: Coping: Goal: Level of anxiety will decrease Outcome: Progressing   Problem: Education: Goal: Emotional status will improve Outcome: Progressing   

## 2024-08-14 NOTE — Plan of Care (Signed)
   Problem: Activity: Goal: Risk for activity intolerance will decrease Outcome: Progressing   Problem: Nutrition: Goal: Adequate nutrition will be maintained Outcome: Progressing

## 2024-08-14 NOTE — Group Note (Signed)
 Date:  08/14/2024 Time:  8:37 PM  Group Topic/Focus:  Wrap-Up Group:   The focus of this group is to help patients review their daily goal of treatment and discuss progress on daily workbooks.    Participation Level:  Did Not Attend  Beatris ONEIDA Hasten 08/14/2024, 8:37 PM

## 2024-08-14 NOTE — Progress Notes (Signed)
 [facility] MD Progress Note  08/14/2024 4:56 PM Benjamin [name]  MRN:  [id number] Benjamin [name] is a 78 year old male who presents to the inpatient geriatric psych unit after jumping from a two story building. Patient was originally seen at [name] who referred him to the ED who admitted him here on 03/27/2024. Patient reports that people from the [place] gang were trying to kill him and the only escape was through the bedroom window on the second floor. He currently lives in the house with [name] and his family and has been living with them for 3-4 years without any problems. On 03/19/2024 he went to the doctor and when he got back home the family wouldn't let him leave. He states that after he realized the gang was going to kill him he barricaded the door with his bed and jumped out the window. When he landed he ran away until one of his neighbors found him and called 911. Patient is admitted to [name] unit with Q15 min safety monitoring. Multidisciplinary team approach is offered. Medication management; group/milieu therapy is offered.     Subjective: Chart is reviewed and discussed with the treatment team.  APS is working on legal guardianship and placement.  He has guardianship hearing June 19, 2024 Patient is noted to be resting in bed.  He offers no complaints.  He reports fair appetite and sleep.  He did express his frustration and wants to leave the hospital to go to a normal living arrangement.  Patient is not responding to internal stimuli but remains confused in general.  He is maintaining safe behaviors on the unit.  Psychiatric History: see h&P  Family History:  Family History  Problem Relation Age of Onset   Colon cancer Neg Hx    Colon polyps Neg Hx    Stomach cancer Neg Hx    Esophageal cancer Neg Hx    Social History:  Social History   Substance and Sexual Activity  Alcohol Use No     Social History   Substance and Sexual Activity  Drug Use No     Social History   Socioeconomic History   Marital status: Single    Spouse name: Not on file   Number of children: Not on file   Years of education: Not on file   Highest education level: Not on file  Occupational History   Not on file  Tobacco Use   Smoking status: Former    Types: Cigarettes   Smokeless tobacco: Never  Vaping Use   Vaping status: Never Used  Substance and Sexual Activity   Alcohol use: No   Drug use: No   Sexual activity: Not on file  Other Topics Concern   Not on file  Social History Narrative   Not on file   Social Drivers of Health   Tobacco Use: Medium Risk (03/27/2024)   Patient History    Smoking Tobacco Use: Former    Smokeless Tobacco Use: Never    Passive Exposure: Not on Actuary Strain: Not on file  Food Insecurity: No Food Insecurity (03/27/2024)   Epic    Worried About Programme Researcher, Broadcasting/film/video in the Last Year: Never true    Ran Out of Food in the Last Year: Never true  Transportation Needs: No Transportation Needs (03/27/2024)   Epic    Lack of Transportation (Medical): No    Lack of Transportation (Non-Medical): No  Physical Activity: Not on file  Stress: Not on  file  Social Connections: Moderately Integrated (03/27/2024)   Social Connection and Isolation Panel    Frequency of Communication with Friends and Family: Twice a week    Frequency of Social Gatherings with Friends and Family: Once a week    Attends Religious Services: 1 to 4 times per year    Active Member of [employer] or Organizations: No    Attends Banker Meetings: 1 to 4 times per year    Marital Status: Divorced  Depression (PHQ2-9): Not on file  Alcohol Screen: Low Risk (03/27/2024)   Alcohol Screen    Last Alcohol Screening Score (AUDIT): 0  Housing: Low Risk (03/27/2024)   Epic    Unable to Pay for Housing in the Last Year: No    Number of Times Moved in the Last Year: 0    Homeless in the Last Year: No  Utilities: Not At Risk (03/27/2024)   Epic     Threatened with loss of utilities: No  Health Literacy: Not on file   Past Medical History:  Past Medical History:  Diagnosis Date   Allergy    Arthritis    back   Prostate cancer (HCC) dx'd 2010   surg only    Past Surgical History:  Procedure Laterality Date   COLONOSCOPY     EYE SURGERY     PROSTATE SURGERY      Current Medications: Current Facility-Administered Medications  Medication Dose Route Frequency Provider Last Rate Last Admin   acetaminophen  (TYLENOL ) tablet 650 mg  650 mg Oral Q6H PRN Coleman, Carolyn H, NP       alum & mag hydroxide-simeth (MAALOX/MYLANTA) 200-200-20 MG/5ML suspension 30 mL  30 mL Oral Q4H PRN Mardy Elveria DEL, NP       docusate sodium  (COLACE) capsule 100 mg  100 mg Oral Daily Chalyn Amescua, MD   100 mg at 08/14/24 9193   fluPHENAZine  (PROLIXIN ) tablet 10 mg  10 mg Oral BID Shrivastava, Aryendra, MD   10 mg at 08/14/24 9193   magnesium  hydroxide (MILK OF MAGNESIA) suspension 30 mL  30 mL Oral Daily PRN Mardy Elveria DEL, NP       OLANZapine  (ZYPREXA ) injection 5 mg  5 mg Intramuscular TID PRN Mardy Elveria DEL, NP   5 mg at 04/12/24 2230   OLANZapine  zydis (ZYPREXA ) disintegrating tablet 5 mg  5 mg Oral TID PRN Coleman, Carolyn H, NP   5 mg at 07/22/24 0034   polyethylene glycol (MIRALAX  / GLYCOLAX ) packet 17 g  17 g Oral Daily PRN Donnelly Mellow, MD   17 g at 06/04/24 2123    Lab Results:  No results found for this or any previous visit (from the past 48 hours).      Blood Alcohol level:  Lab Results  Component Value Date   [facility] <15 03/26/2024    Metabolic Disorder Labs: Lab Results  Component Value Date   HGBA1C 5.6 04/02/2024   MPG 114 04/02/2024   No results found for: PROLACTIN Lab Results  Component Value Date   CHOL 146 04/02/2024   TRIG 37 04/02/2024   HDL 43 04/02/2024   CHOLHDL 3.4 04/02/2024   VLDL 7 04/02/2024   LDLCALC 96 04/02/2024    Physical Findings: AIMS:  , ,  ,  ,    CIWA:    COWS:       Psychiatric Specialty Exam:  Presentation  General Appearance:  Appropriate for Environment; Bizarre  Eye Contact: Fair  Speech: Normal rate  Speech Volume: Normal    Mood and Affect  Mood: Fine Affect: Congruent   Thought Process  Thought Processes: Disorganized At baseline Descriptions of Associations:Intact  Orientation:Full (Time, Place and Person)  Thought Content: Impoverished Hallucinations: Denies  Ideas of Reference: denies Suicidal Thoughts: Denies  Homicidal Thoughts: Denies   Sensorium  Memory: impiared  Judgment: Impaired  Insight: Community Education Officer  Concentration: Fair  Attention Span: Fair  Recall: [employer] of Knowledge: Fair  Language: Fair   Psychomotor Activity  Psychomotor Activity: No data recorded  Musculoskeletal: Strength & Muscle Tone: within normal limits Gait & Station: normal Assets  Assets: Manufacturing Systems Engineer; Resilience    Physical Exam: Physical Exam Vitals and nursing note reviewed.    ROS Blood pressure 129/83, pulse 61, temperature 97.9 F (36.6 C), resp. rate 18, height 6' (1.829 m), weight 100 kg, SpO2 99%. Body mass index is 29.91 kg/m.  Diagnosis: Principal Problem:   Delusional disorder (HCC) Active Problems:   Laceration of left eyebrow   Major neurocognitive disorder [facility])  Major Neurocognitive disorder  Clinical Decision Making: Patient currently admitted after jumping off a two-story building in the context of possible delusions as reported as the people in the house he lived for 4 years trying to kill him.  Patient needs to be monitored closely for ongoing psychosis and paranoid delusions.  Given patient's confusion and chronic paranoia, no family support, APS has taken up guardianship and looking for placement     Safety and Monitoring:             -- InVoluntary admission to inpatient psychiatric unit for safety, stabilization and treatment              -- Daily contact with patient to assess and evaluate symptoms and progress in treatment             -- Patient's case to be discussed in multi-disciplinary team meeting             -- Observation Level: q15 minute checks             -- Vital signs:  q12 hours             -- Precautions: suicide, elopement, and assault   2. Psychiatric Diagnoses and Treatment:              Continue Prolixin  to 10 mg twice daily.  -- The risks/benefits/side-effects/alternatives to this medication were discussed in detail with the patient and time was given for questions. The patient consents to medication trial.                -- Metabolic profile and EKG monitoring obtained while on an atypical antipsychotic (BMI: Lipid Panel: HbgA1c: QTc:)              -- Encouraged patient to participate in unit milieu and in scheduled group therapies                            3. Medical Issues Being Addressed:    Fall on/8/25, CT head normal  4. Discharge Planning:   -- Social work and case management to assist with discharge planning and identification of hospital follow-up needs prior to discharge  -- Estimated LOS: 3-4 days  Allyn Foil, MD 08/14/2024, 4:56 PM

## 2024-08-15 DIAGNOSIS — F22 Delusional disorders: Secondary | ICD-10-CM | POA: Diagnosis not present

## 2024-08-15 NOTE — Progress Notes (Signed)
 [facility] MD Progress Note  08/15/2024 11:57 AM Benjamin [name]  MRN:  [id number] Benjamin [name] is a 78 year old male who presents to the inpatient geriatric psych unit after jumping from a two story building. Patient was originally seen at [name] who referred him to the ED who admitted him here on 03/27/2024. Patient reports that people from the [place] gang were trying to kill him and the only escape was through the bedroom window on the second floor. He currently lives in the house with [name] and his family and has been living with them for 3-4 years without any problems. On 03/19/2024 he went to the doctor and when he got back home the family wouldn't let him leave. He states that after he realized the gang was going to kill him he barricaded the door with his bed and jumped out the window. When he landed he ran away until one of his neighbors found him and called 911. Patient is admitted to [facility] unit with Q15 min safety monitoring. Multidisciplinary team approach is offered. Medication management; group/milieu therapy is offered.     Subjective: Chart is reviewed and discussed with the treatment team.  APS is working on legal guardianship and placement.  He has guardianship hearing June 19, 2024  Patient is noted to be resting in bed.  He offers no complaints.  Per nursing patient remains confused but is pleasant.  He is taking his medications and is able to verbalize his needs to the staff members.  He is not responding to internal stimuli. Psychiatric History: see h&P  Family History:  Family History  Problem Relation Age of Onset   Colon cancer Neg Hx    Colon polyps Neg Hx    Stomach cancer Neg Hx    Esophageal cancer Neg Hx    Social History:  Social History   Substance and Sexual Activity  Alcohol Use No     Social History   Substance and Sexual Activity  Drug Use No    Social History   Socioeconomic History   Marital status: Single    Spouse  name: Not on file   Number of children: Not on file   Years of education: Not on file   Highest education level: Not on file  Occupational History   Not on file  Tobacco Use   Smoking status: Former    Types: Cigarettes   Smokeless tobacco: Never  Vaping Use   Vaping status: Never Used  Substance and Sexual Activity   Alcohol use: No   Drug use: No   Sexual activity: Not on file  Other Topics Concern   Not on file  Social History Narrative   Not on file   Social Drivers of Health   Tobacco Use: Medium Risk (03/27/2024)   Patient History    Smoking Tobacco Use: Former    Smokeless Tobacco Use: Never    Passive Exposure: Not on Actuary Strain: Not on file  Food Insecurity: No Food Insecurity (03/27/2024)   Epic    Worried About Programme Researcher, Broadcasting/film/video in the Last Year: Never true    Ran Out of Food in the Last Year: Never true  Transportation Needs: No Transportation Needs (03/27/2024)   Epic    Lack of Transportation (Medical): No    Lack of Transportation (Non-Medical): No  Physical Activity: Not on file  Stress: Not on file  Social Connections: Moderately Integrated (03/27/2024)   Social Connection and Isolation Panel  Frequency of Communication with Friends and Family: Twice a week    Frequency of Social Gatherings with Friends and Family: Once a week    Attends Religious Services: 1 to 4 times per year    Active Member of [employer] or Organizations: No    Attends Banker Meetings: 1 to 4 times per year    Marital Status: Divorced  Depression (PHQ2-9): Not on file  Alcohol Screen: Low Risk (03/27/2024)   Alcohol Screen    Last Alcohol Screening Score (AUDIT): 0  Housing: Low Risk (03/27/2024)   Epic    Unable to Pay for Housing in the Last Year: No    Number of Times Moved in the Last Year: 0    Homeless in the Last Year: No  Utilities: Not At Risk (03/27/2024)   Epic    Threatened with loss of utilities: No  Health Literacy: Not on file   Past  Medical History:  Past Medical History:  Diagnosis Date   Allergy    Arthritis    back   Prostate cancer (HCC) dx'd 2010   surg only    Past Surgical History:  Procedure Laterality Date   COLONOSCOPY     EYE SURGERY     PROSTATE SURGERY      Current Medications: Current Facility-Administered Medications  Medication Dose Route Frequency Provider Last Rate Last Admin   acetaminophen  (TYLENOL ) tablet 650 mg  650 mg Oral Q6H PRN Coleman, Carolyn H, NP       alum & mag hydroxide-simeth (MAALOX/MYLANTA) 200-200-20 MG/5ML suspension 30 mL  30 mL Oral Q4H PRN Mardy Elveria DEL, NP       docusate sodium  (COLACE) capsule 100 mg  100 mg Oral Daily Wenda Vanschaick, MD   100 mg at 08/15/24 9049   fluPHENAZine  (PROLIXIN ) tablet 10 mg  10 mg Oral BID Shrivastava, Aryendra, MD   10 mg at 08/15/24 0950   magnesium  hydroxide (MILK OF MAGNESIA) suspension 30 mL  30 mL Oral Daily PRN Mardy Elveria DEL, NP       OLANZapine  (ZYPREXA ) injection 5 mg  5 mg Intramuscular TID PRN Mardy Elveria DEL, NP   5 mg at 04/12/24 2230   OLANZapine  zydis (ZYPREXA ) disintegrating tablet 5 mg  5 mg Oral TID PRN Coleman, Carolyn H, NP   5 mg at 07/22/24 0034   polyethylene glycol (MIRALAX  / GLYCOLAX ) packet 17 g  17 g Oral Daily PRN Donnelly Mellow, MD   17 g at 06/04/24 2123    Lab Results:  No results found for this or any previous visit (from the past 48 hours).      Blood Alcohol level:  Lab Results  Component Value Date   [name] <15 03/26/2024    Metabolic Disorder Labs: Lab Results  Component Value Date   HGBA1C 5.6 04/02/2024   MPG 114 04/02/2024   No results found for: PROLACTIN Lab Results  Component Value Date   CHOL 146 04/02/2024   TRIG 37 04/02/2024   HDL 43 04/02/2024   CHOLHDL 3.4 04/02/2024   VLDL 7 04/02/2024   LDLCALC 96 04/02/2024    Physical Findings: AIMS:  , ,  ,  ,    CIWA:    COWS:      Psychiatric Specialty Exam:  Presentation  General Appearance:  Appropriate  for Environment; Bizarre  Eye Contact: Fair  Speech: Normal rate  Speech Volume: Normal    Mood and Affect  Mood: Fine Affect: Congruent   Thought  Process  Thought Processes: Disorganized At baseline Descriptions of Associations:Intact  Orientation:Full (Time, Place and Person)  Thought Content: Impoverished Hallucinations: Denies  Ideas of Reference: denies Suicidal Thoughts: Denies  Homicidal Thoughts: Denies   Sensorium  Memory: impiared  Judgment: Impaired  Insight: Shallow   Executive Functions  Concentration: Fair  Attention Span: Fair  Recall: [employer] of Knowledge: Fair  Language: Fair   Psychomotor Activity  Psychomotor Activity: No data recorded  Musculoskeletal: Strength & Muscle Tone: within normal limits Gait & Station: normal Assets  Assets: Manufacturing Systems Engineer; Resilience    Physical Exam: Physical Exam Vitals and nursing note reviewed.    ROS Blood pressure 92/62, pulse 77, temperature 98.4 F (36.9 C), resp. rate 16, height 6' (1.829 m), weight 100 kg, SpO2 96%. Body mass index is 29.91 kg/m.  Diagnosis: Principal Problem:   Delusional disorder (HCC) Active Problems:   Laceration of left eyebrow   Major neurocognitive disorder [facility])  Major Neurocognitive disorder  Clinical Decision Making: Patient currently admitted after jumping off a two-story building in the context of possible delusions as reported as the people in the house he lived for 4 years trying to kill him.  Patient needs to be monitored closely for ongoing psychosis and paranoid delusions.  Given patient's confusion and chronic paranoia, no family support, APS has taken up guardianship and looking for placement     Safety and Monitoring:             -- InVoluntary admission to inpatient psychiatric unit for safety, stabilization and treatment             -- Daily contact with patient to assess and evaluate symptoms and progress in  treatment             -- Patient's case to be discussed in multi-disciplinary team meeting             -- Observation Level: q15 minute checks             -- Vital signs:  q12 hours             -- Precautions: suicide, elopement, and assault   2. Psychiatric Diagnoses and Treatment:              Continue Prolixin  to 10 mg twice daily.  -- The risks/benefits/side-effects/alternatives to this medication were discussed in detail with the patient and time was given for questions. The patient consents to medication trial.                -- Metabolic profile and EKG monitoring obtained while on an atypical antipsychotic (BMI: Lipid Panel: HbgA1c: QTc:)              -- Encouraged patient to participate in unit milieu and in scheduled group therapies                            3. Medical Issues Being Addressed:    Fall on/8/25, CT head normal  4. Discharge Planning:   -- Social work and case management to assist with discharge planning and identification of hospital follow-up needs prior to discharge  -- Estimated LOS: 3-4 days  Allyn Foil, MD 08/15/2024, 11:57 AM

## 2024-08-15 NOTE — Group Note (Signed)
 Date:  08/15/2024 Time:  3:27 PM  Group Topic/Focus:  Goals Group:   The focus of this group is to help patients establish daily goals to achieve during treatment and discuss how the patient can incorporate goal setting into their daily lives to aide in recovery. Managing Feelings:   The focus of this group is to identify what feelings patients have difficulty handling and develop a plan to handle them in a healthier way upon discharge.    Participation Level:  Did Not Attend   Benjamin [name] BRAVO 08/15/2024, 3:27 PM

## 2024-08-15 NOTE — BHH Counselor (Signed)
 Placement update:   According to [name], pt has been accepted to placement, but she reports the placement is awaiting the CCA being received. Arch reports she has reached out to [employer], pt's care coordinators to discuss further.   CSW awaits further updates at this time.   Lum Croft, MSW, [place] 08/15/2024 1:19 PM

## 2024-08-15 NOTE — Group Note (Signed)
 Recreation Therapy Group Note   Group Topic:Communication  Group Date: 08/15/2024 Start Time: 1250 End Time: 1325 Facilitators: Celestia Jeoffrey BRAVO, LRT, CTRS Location: Dayroom  Group Description: LRT and NT, Leita, passed out large Christmas bags filled with 4-6 individually wrapped presents inside for the patient to unwrap. Christmas music was being played in the background. LRT, NT, and patients discussed past Christmas traditions and fun memories they have made while communicating with one another in the dayroom.   Goal Area(s) addressed: Patient will increase communication.  Patient will reminisce on past memory Patient will practice gratitude.    Affect/Mood: Asleep   Participation Level: Non-verbal    Clinical Observations/Individualized Feedback: Benjamin [name] was present in the dayroom. Pt was asleep duration of session.   Plan: Continue to engage patient in RT group sessions 2-3x/week.   Jeoffrey BRAVO Celestia, LRT, CTRS 08/15/2024 1:43 PM

## 2024-08-15 NOTE — Progress Notes (Signed)
" °   08/15/24 1200  Psych Admission Type (Psych Patients Only)  Admission Status Involuntary  Psychosocial Assessment  Patient Complaints None  Eye Contact Fair  Facial Expression Flat  Affect Flat  Speech Soft  Interaction Other (Comment) (calm, joking at times)  Motor Activity Slow  Appearance/Hygiene In scrubs  Behavior Characteristics Appropriate to situation  Mood Pleasant  Thought Process  Coherency WDL  Content WDL  Delusions None reported or observed  Perception WDL  Hallucination None reported or observed  Judgment Impaired  Confusion Moderate  Danger to Self  Current suicidal ideation? Denies  Danger to Others  Danger to Others None reported or observed    "

## 2024-08-15 NOTE — Group Note (Signed)
 Date:  08/15/2024 Time:  9:27 PM  Group Topic/Focus:  Wrap-Up Group:   The focus of this group is to help patients review their daily goal of treatment and discuss progress on daily workbooks.    Participation Level:  Active  Participation Quality:  Appropriate  Affect:  Appropriate  Cognitive:  Alert  Insight: Appropriate  Engagement in Group:  Engaged  Modes of Intervention:  Discussion  Additional Comments:    [name] 08/15/2024, 9:27 PM

## 2024-08-15 NOTE — Group Note (Signed)
 Date:  08/15/2024 Time:  2:01 PM  Group Topic/Focus:  Self Care:   The focus of this group is to help patients understand the importance of self-care in order to improve or restore emotional, physical, spiritual, interpersonal, and financial health.    Participation Level:  Active  Participation Quality:  Appropriate and Attentive  Affect:  Appropriate  Cognitive:  Alert  Insight: Appropriate  Engagement in Group:  Engaged  Modes of Intervention:  Activity, Discussion, Orientation, and Socialization  Additional Comments:  Christmas party    Maglione,Hassen Bruun E 08/15/2024, 2:01 PM

## 2024-08-15 NOTE — Progress Notes (Signed)
 Mobility Specialist Progress Note:    08/15/24 1301  Mobility  Activity Ambulated with assistance  Level of Assistance Contact guard assist, steadying assist  Assistive Device Front wheel walker  Distance Ambulated (ft) 300 ft  Range of Motion/Exercises Active;All extremities  Activity Response Tolerated well  Mobility visit 1 Mobility  Mobility Specialist Start Time (ACUTE ONLY) 1228  Mobility Specialist Stop Time (ACUTE ONLY) 1253  Mobility Specialist Time Calculation (min) (ACUTE ONLY) 25 min   Pt received in day room, agreeable to mobility. Required CGA/SBA to stand and ambulate with RW. Tolerated well, requires verbal cues for correct positioning with RW. Returned to day room, all needs met.  Sherrilee Ditty Mobility Specialist Please contact via Special Educational Needs Teacher or  Rehab office at [phone]

## 2024-08-15 NOTE — Plan of Care (Signed)
  Problem: Coping: Goal: Level of anxiety will decrease Outcome: Progressing   Problem: Education: Goal: Emotional status will improve Outcome: Progressing   

## 2024-08-16 DIAGNOSIS — F22 Delusional disorders: Secondary | ICD-10-CM | POA: Diagnosis not present

## 2024-08-16 NOTE — Plan of Care (Signed)
  Problem: Pain Managment: Goal: General experience of comfort will improve and/or be controlled Outcome: Progressing   Problem: Safety: Goal: Ability to remain free from injury will improve Outcome: Progressing   Problem: Skin Integrity: Goal: Risk for impaired skin integrity will decrease Outcome: Progressing

## 2024-08-16 NOTE — Progress Notes (Signed)
 [facility] MD Progress Note  08/16/2024 12:37 PM Darragh Nay  MRN:  [id number] Benjamin [name] is a 78 year old male who presents to the inpatient geriatric psych unit after jumping from a two story building. Patient was originally seen at [facility] who referred him to the ED who admitted him here on 03/27/2024. Patient reports that people from the [place] gang were trying to kill him and the only escape was through the bedroom window on the second floor. He currently lives in the house with [name] and his family and has been living with them for 3-4 years without any problems. On 03/19/2024 he went to the doctor and when he got back home the family wouldn't let him leave. He states that after he realized the gang was going to kill him he barricaded the door with his bed and jumped out the window. When he landed he ran away until one of his neighbors found him and called 911. Patient is admitted to [facility] unit with Q15 min safety monitoring. Multidisciplinary team approach is offered. Medication management; group/milieu therapy is offered.     Subjective: Chart is reviewed and discussed with the treatment team.  APS is working on legal guardianship and placement.  He has guardianship hearing June 19, 2024 Patient is noted to be enjoying Christmas carols on TV.  Patient is encouraged to sing as he likes to sing.  Patient reports wanting to transition to his new living arrangement.  He denies SI/HI/plan.  Per nursing report he is taking his medications and gets along very well with peers and staff members.  Psychiatric History: see h&P  Family History:  Family History  Problem Relation Age of Onset   Colon cancer Neg Hx    Colon polyps Neg Hx    Stomach cancer Neg Hx    Esophageal cancer Neg Hx    Social History:  Social History   Substance and Sexual Activity  Alcohol Use No     Social History   Substance and Sexual Activity  Drug Use No    Social History    Socioeconomic History   Marital status: Single    Spouse name: Not on file   Number of children: Not on file   Years of education: Not on file   Highest education level: Not on file  Occupational History   Not on file  Tobacco Use   Smoking status: Former    Types: Cigarettes   Smokeless tobacco: Never  Vaping Use   Vaping status: Never Used  Substance and Sexual Activity   Alcohol use: No   Drug use: No   Sexual activity: Not on file  Other Topics Concern   Not on file  Social History Narrative   Not on file   Social Drivers of Health   Tobacco Use: Medium Risk (03/27/2024)   Patient History    Smoking Tobacco Use: Former    Smokeless Tobacco Use: Never    Passive Exposure: Not on Actuary Strain: Not on file  Food Insecurity: No Food Insecurity (03/27/2024)   Epic    Worried About Programme Researcher, Broadcasting/film/video in the Last Year: Never true    Ran Out of Food in the Last Year: Never true  Transportation Needs: No Transportation Needs (03/27/2024)   Epic    Lack of Transportation (Medical): No    Lack of Transportation (Non-Medical): No  Physical Activity: Not on file  Stress: Not on file  Social Connections: Moderately Integrated (  03/27/2024)   Social Connection and Isolation Panel    Frequency of Communication with Friends and Family: Twice a week    Frequency of Social Gatherings with Friends and Family: Once a week    Attends Religious Services: 1 to 4 times per year    Active Member of [employer] or Organizations: No    Attends Banker Meetings: 1 to 4 times per year    Marital Status: Divorced  Depression (PHQ2-9): Not on file  Alcohol Screen: Low Risk (03/27/2024)   Alcohol Screen    Last Alcohol Screening Score (AUDIT): 0  Housing: Low Risk (03/27/2024)   Epic    Unable to Pay for Housing in the Last Year: No    Number of Times Moved in the Last Year: 0    Homeless in the Last Year: No  Utilities: Not At Risk (03/27/2024)   Epic    Threatened  with loss of utilities: No  Health Literacy: Not on file   Past Medical History:  Past Medical History:  Diagnosis Date   Allergy    Arthritis    back   Prostate cancer (HCC) dx'd 2010   surg only    Past Surgical History:  Procedure Laterality Date   COLONOSCOPY     EYE SURGERY     PROSTATE SURGERY      Current Medications: Current Facility-Administered Medications  Medication Dose Route Frequency Provider Last Rate Last Admin   acetaminophen  (TYLENOL ) tablet 650 mg  650 mg Oral Q6H PRN Coleman, Carolyn H, NP       alum & mag hydroxide-simeth (MAALOX/MYLANTA) 200-200-20 MG/5ML suspension 30 mL  30 mL Oral Q4H PRN Coleman, Carolyn H, NP       docusate sodium  (COLACE) capsule 100 mg  100 mg Oral Daily Angalena Cousineau, MD   100 mg at 08/16/24 0915   fluPHENAZine  (PROLIXIN ) tablet 10 mg  10 mg Oral BID Shrivastava, Aryendra, MD   10 mg at 08/16/24 9241   magnesium  hydroxide (MILK OF MAGNESIA) suspension 30 mL  30 mL Oral Daily PRN Mardy Elveria DEL, NP       OLANZapine  (ZYPREXA ) injection 5 mg  5 mg Intramuscular TID PRN Mardy Elveria DEL, NP   5 mg at 04/12/24 2230   OLANZapine  zydis (ZYPREXA ) disintegrating tablet 5 mg  5 mg Oral TID PRN Coleman, Carolyn H, NP   5 mg at 07/22/24 0034   polyethylene glycol (MIRALAX  / GLYCOLAX ) packet 17 g  17 g Oral Daily PRN Donnelly Mellow, MD   17 g at 06/04/24 2123    Lab Results:  No results found for this or any previous visit (from the past 48 hours).      Blood Alcohol level:  Lab Results  Component Value Date   [facility] <15 03/26/2024    Metabolic Disorder Labs: Lab Results  Component Value Date   HGBA1C 5.6 04/02/2024   MPG 114 04/02/2024   No results found for: PROLACTIN Lab Results  Component Value Date   CHOL 146 04/02/2024   TRIG 37 04/02/2024   HDL 43 04/02/2024   CHOLHDL 3.4 04/02/2024   VLDL 7 04/02/2024   LDLCALC 96 04/02/2024    Physical Findings: AIMS:  , ,  ,  ,    CIWA:    COWS:      Psychiatric  Specialty Exam:  Presentation  General Appearance:  Appropriate for Environment; Bizarre  Eye Contact: Fair  Speech: Normal rate  Speech Volume: Normal  Mood and Affect  Mood: Fine Affect: Congruent   Thought Process  Thought Processes: Disorganized At baseline Descriptions of Associations:Intact  Orientation:Full (Time, Place and Person)  Thought Content: Impoverished Hallucinations: Denies  Ideas of Reference: denies Suicidal Thoughts: Denies  Homicidal Thoughts: Denies   Sensorium  Memory: impiared  Judgment: Impaired  Insight: Shallow   Executive Functions  Concentration: Fair  Attention Span: Fair  Recall: [employer] of Knowledge: Fair  Language: Fair   Psychomotor Activity  Psychomotor Activity: No data recorded  Musculoskeletal: Strength & Muscle Tone: within normal limits Gait & Station: normal Assets  Assets: Manufacturing Systems Engineer; Resilience    Physical Exam: Physical Exam Vitals and nursing note reviewed.    ROS Blood pressure 132/67, pulse 64, temperature 97.6 F (36.4 C), resp. rate 18, height 6' (1.829 m), weight 100 kg, SpO2 99%. Body mass index is 29.91 kg/m.  Diagnosis: Principal Problem:   Delusional disorder (HCC) Active Problems:   Laceration of left eyebrow   Major neurocognitive disorder [name])  Major Neurocognitive disorder  Clinical Decision Making: Patient currently admitted after jumping off a two-story building in the context of possible delusions as reported as the people in the house he lived for 4 years trying to kill him.  Patient needs to be monitored closely for ongoing psychosis and paranoid delusions.  Given patient's confusion and chronic paranoia, no family support, APS has taken up guardianship and looking for placement     Safety and Monitoring:             -- InVoluntary admission to inpatient psychiatric unit for safety, stabilization and treatment             -- Daily  contact with patient to assess and evaluate symptoms and progress in treatment             -- Patient's case to be discussed in multi-disciplinary team meeting             -- Observation Level: q15 minute checks             -- Vital signs:  q12 hours             -- Precautions: suicide, elopement, and assault   2. Psychiatric Diagnoses and Treatment:              Continue Prolixin  to 10 mg twice daily.  -- The risks/benefits/side-effects/alternatives to this medication were discussed in detail with the patient and time was given for questions. The patient consents to medication trial.                -- Metabolic profile and EKG monitoring obtained while on an atypical antipsychotic (BMI: Lipid Panel: HbgA1c: QTc:)              -- Encouraged patient to participate in unit milieu and in scheduled group therapies                            3. Medical Issues Being Addressed:    Fall on/8/25, CT head normal  4. Discharge Planning:   -- Social work and case management to assist with discharge planning and identification of hospital follow-up needs prior to discharge  -- Estimated LOS: 3-4 days  Allyn Foil, MD 08/16/2024, 12:37 PM

## 2024-08-16 NOTE — Group Note (Signed)
 Date:  08/16/2024 Time:  10:36 AM  Group Topic/Focus:  Christmas Movie     Participation Level:  Did Not Attend    [name] 08/16/2024, 10:36 AM

## 2024-08-16 NOTE — Plan of Care (Signed)
°  Problem: Clinical Measurements: Goal: Will remain free from infection Outcome: Progressing   Problem: Nutrition: Goal: Adequate nutrition will be maintained Outcome: Progressing   Problem: Coping: Goal: Level of anxiety will decrease Outcome: Progressing

## 2024-08-16 NOTE — Progress Notes (Signed)
" °   08/15/24 2200  Psych Admission Type (Psych Patients Only)  Admission Status Involuntary  Psychosocial Assessment  Patient Complaints None  Eye Contact Fair  Facial Expression Flat  Affect Flat  Speech Soft  Interaction Minimal  Motor Activity Slow  Appearance/Hygiene Unremarkable  Behavior Characteristics Appropriate to situation;Cooperative  Mood Pleasant  Thought Process  Coherency WDL  Content WDL  Delusions None reported or observed  Perception WDL  Hallucination None reported or observed  Judgment Impaired  Confusion Moderate  Danger to Self  Current suicidal ideation? Denies    "

## 2024-08-16 NOTE — Progress Notes (Signed)
" °   08/16/24 1100  Psych Admission Type (Psych Patients Only)  Admission Status Involuntary  Psychosocial Assessment  Patient Complaints None  Eye Contact Fair  Facial Expression Flat  Affect Flat  Speech Soft  Interaction Minimal  Motor Activity Slow  Appearance/Hygiene Unremarkable  Behavior Characteristics Cooperative  Mood Pleasant  Thought Process  Coherency WDL  Content WDL  Delusions None reported or observed  Perception WDL  Hallucination None reported or observed  Judgment Impaired  Confusion Moderate  Danger to Self  Current suicidal ideation? Denies  Danger to Others  Danger to Others None reported or observed    "

## 2024-08-16 NOTE — Group Note (Signed)
 Date:  08/16/2024 Time:  8:34 PM  Group Topic/Focus:  Goals Group:   The focus of this group is to help patients establish daily goals to achieve during treatment and discuss how the patient can incorporate goal setting into their daily lives to aide in recovery. Personal Choices and Values:   The focus of this group is to help patients assess and explore the importance of values in their lives, how their values affect their decisions, how they express their values and what opposes their expression. Spirituality:   The focus of this group is to discuss how one's spirituality can aide in recovery.    Participation Level:  Pt Did Not Attend  Participation Quality:  Pt Did Not Attend  Affect:  Pt Did Not Attend  Cognitive:  Pt Did Not Attend  Insight: Pt Did Not Attend  Engagement in Group:  Pt Did Not Attend  Modes of Intervention:  Pt Did Not Attend  Additional Comments:  Pt Did Not Attend  Calib Wadhwa E Devinn Hurwitz 08/16/2024, 8:34 PM

## 2024-08-16 NOTE — Group Note (Signed)
 Date:  08/16/2024 Time:  3:44 PM  Group Topic/Focus:  Self Care:   The focus of this group is to help patients understand the importance of self-care in order to improve or restore emotional, physical, spiritual, interpersonal, and financial health.  Meditation offers significant benefits for geriatric patients, including boosting cognitive health (memory, focus), improving emotional well-being (reducing stress, anxiety, depression, loneliness), managing chronic pain, lowering blood pressure, strengthening the immune system, and enhancing physical balance and flexibility through gentle movements like Tai Chi, all while supporting brain plasticity and overall quality of life as they age.   Participation Level:  Did Not Attend   [name] Gavel 08/16/2024, 3:44 PM

## 2024-08-16 NOTE — Group Note (Signed)
 [facility] LCSW Group Therapy Note   Group Date: 08/16/2024 Start Time: 1300 End Time: 1400  Type of Therapy/Topic:  Group Therapy:  Feelings about Diagnosis  Participation Level:  Active   Mood: Appropriate    Description of Group:    This group will allow patients to explore their thoughts and feelings about diagnoses they have received. Patients will be guided to explore their level of understanding and acceptance of these diagnoses. Facilitator will encourage patients to process their thoughts and feelings about the reactions of others to their diagnosis, and will guide patients in identifying ways to discuss their diagnosis with significant others in their lives. This group will be process-oriented, with patients participating in exploration of their own experiences as well as giving and receiving support and challenge from other group members.   Therapeutic Goals: 1. Patient will demonstrate understanding of diagnosis as evidence by identifying two or more symptoms of the disorder:  2. Patient will be able to express two feelings regarding the diagnosis 3. Patient will demonstrate ability to communicate their needs through discussion and/or role plays  Summary of Patient Progress: Participants reflected on their emotions related to the holidays, using the occasion as a chance to engage in a life review.  Patients played and sang their most cherished nostalgic songs to evoke a sense of comfort and happiness.  Patients created a supportive environment where everyone felt safe to open up, share, and explore their emotions.    Therapeutic Modalities:   Cognitive Behavioral Therapy Brief Therapy Feelings Identification    Benjamin CHRISTELLA Kerns, LCSW

## 2024-08-17 DIAGNOSIS — F22 Delusional disorders: Secondary | ICD-10-CM | POA: Diagnosis not present

## 2024-08-17 NOTE — Group Note (Signed)
 Date:  08/17/2024 Time:  9:30 PM  Group Topic/Focus:  Wrap-Up Group:   The focus of this group is to help patients review their daily goal of treatment and discuss progress on daily workbooks.    Participation Level:  Active  Participation Quality:  Appropriate  Affect:  Appropriate  Cognitive:  Alert  Insight: Appropriate  Engagement in Group:  Engaged  Modes of Intervention:  Discussion  Additional Comments:    Benjamin [name] [name] 08/17/2024, 9:30 PM

## 2024-08-17 NOTE — Group Note (Signed)
 Date:  08/17/2024 Time:  10:38 AM  Group Topic/Focus:  Movement Therapy, Morning Stretch with Eiza Canniff.    Participation Level:  Did Not Attend    Benjamin [name] 08/17/2024, 10:38 AM

## 2024-08-17 NOTE — Progress Notes (Signed)
" °   08/17/24 1200  Psych Admission Type (Psych Patients Only)  Admission Status Involuntary  Psychosocial Assessment  Patient Complaints None  Eye Contact Fair  Facial Expression Flat  Affect Flat  Speech Soft  Interaction Minimal  Motor Activity Slow  Appearance/Hygiene Unremarkable  Behavior Characteristics Cooperative  Mood Pleasant  Thought Process  Coherency WDL  Content WDL  Delusions None reported or observed  Perception WDL  Hallucination None reported or observed  Judgment Impaired  Confusion Moderate  Danger to Self  Current suicidal ideation? Denies  Danger to Others  Danger to Others None reported or observed    "
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
## 2024-08-17 NOTE — BHH Counselor (Signed)
 CSW attempted to contact [name], [facility] DSS APS SW to discuss the patient's placement.  CSW left HIPAA compliant voicemail.  Sherryle Margo, MSW, LCSW 08/17/2024 1:31 PM

## 2024-08-17 NOTE — Progress Notes (Signed)
(  Sleep Hours) - 5.25 (Any PRNs that were needed, meds refused, or side effects to meds)- None (Any disturbances and when (visitation, over night)-None reported/observed (Concerns raised by the patient)- None (SI/HI/AVH)- Denies

## 2024-08-17 NOTE — Progress Notes (Signed)
 Mobility Specialist - Progress Note   08/17/24 1402  Mobility  Activity Ambulated with assistance  Level of Assistance Standby assist, set-up cues, supervision of patient - no hands on  Assistive Device Front wheel walker  Distance Ambulated (ft) 600 ft  Activity Response Tolerated well  Mobility visit 1 Mobility  Mobility Specialist Start Time (ACUTE ONLY) 1236  Mobility Specialist Stop Time (ACUTE ONLY) 1253  Mobility Specialist Time Calculation (min) (ACUTE ONLY) 17 min   Pt sitting EOB upon arrival, utilizing RA. Pt amb ~600 ft around the hallway MinG-Sup, tolerated well--- VC's for proper body postioning within the RW. Pt returned to the room, left seated EOB with needs within reach.  America Silvan Mobility Specialist 08/17/2024 2:05 PM

## 2024-08-17 NOTE — Plan of Care (Signed)
" °  Problem: Health Behavior/Discharge Planning: Goal: Ability to manage health-related needs will improve Outcome: Progressing   Problem: Education: Goal: Knowledge of General Education information will improve Description: Including pain rating scale, medication(s)/side effects and non-pharmacologic comfort measures Outcome: Progressing   Problem: Skin Integrity: Goal: Risk for impaired skin integrity will decrease Outcome: Progressing   Problem: Education: Goal: Mental status will improve Outcome: Progressing   "
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
## 2024-08-17 NOTE — Group Note (Deleted)
 Date:  08/17/2024 Time:  8:47 PM  Group Topic/Focus:  Overcoming Stress:   The focus of this group is to define stress and help patients assess their triggers.     Participation Level:  {BHH PARTICIPATION OZCZO:77735}  Participation Quality:  {BHH PARTICIPATION QUALITY:22265}  Affect:  {BHH AFFECT:22266}  Cognitive:  {BHH COGNITIVE:22267}  Insight: {BHH Insight2:20797}  Engagement in Group:  {BHH ENGAGEMENT IN HMNLE:77731}  Modes of Intervention:  {BHH MODES OF INTERVENTION:22269}  Additional Comments:  PIERRETTE Ginny JONETTA Orma 08/17/2024, 8:47 PM

## 2024-08-17 NOTE — Group Note (Signed)
 Date:  08/17/2024 Time:  3:46 PM  Group Topic/Focus:  Meditation Therapy    Participation Level:  Did Not Attend    [name] 08/17/2024, 3:46 PM

## 2024-08-18 DIAGNOSIS — F22 Delusional disorders: Secondary | ICD-10-CM | POA: Diagnosis not present

## 2024-08-18 NOTE — Progress Notes (Signed)
" °   08/17/24 2000  Psych Admission Type (Psych Patients Only)  Admission Status Involuntary  Psychosocial Assessment  Patient Complaints Confusion  Eye Contact Fair  Facial Expression Flat  Affect Flat  Speech Soft  Interaction Minimal  Motor Activity Slow  Appearance/Hygiene In scrubs  Behavior Characteristics Cooperative  Mood Pleasant  Thought Process  Coherency WDL  Content WDL  Delusions None reported or observed  Perception WDL  Hallucination None reported or observed  Judgment Impaired  Confusion Moderate  Danger to Self  Current suicidal ideation? Denies    "

## 2024-08-18 NOTE — Group Note (Signed)
 Date:  08/18/2024 Time:  4:12 PM  Group Topic/Focus:  Fresh air Therapy with Music and Conversation    Participation Level:  Did Not Attend    [name] 08/18/2024, 4:12 PM

## 2024-08-18 NOTE — Group Note (Signed)
 LCSW Group Therapy Note   Group Date: 08/18/2024 Start Time: 1400 End Time: 1430   Type of Therapy and Topic:  Group Therapy: Managing Intrusive Thoughts  Participation Level:  Did Not Attend  Description of Group: The purpose of this group is to assist patients in learning how to regulate negative intrusive thoughts.  Intrusive thoughts are unwanted thoughts or vivid images that suddenly enter your mind.  They may be disturbing to the person experiencing them and may trigger feelings like anxiety, shame, or disgust.  Emphasis will be placed on coping with negative intrusive thoughts in situations and using positive strategies to combat negative intrusive thoughts and using positive strategies combat negative thoughts.  Therapeutic Goals: 1. Patient will identify the difference of an inconsequential thought vs an urgent thought. 2.Patient will discuss when they need to seek assistance with negative intrusive thoughts.    Summary of Patient Progress:   The facilitator and patient discussed negative intrusive thoughts and their impact on daily life.  Group members chose a negative intrusive thought and strategies for managing it .  The facilitator and patient examined how different experiences can influence their thoughts.  The patient reflected on how their thoughts, negative or positive can affect outcomes.  The patient was receptive to feedback from both peers and the facilitator and contributed to creating a supportive environment, encouraging others to open up and share.   Therapeutic Modalities:  Cognitive Behavioral Therapy Dialectical Behavior Therapy  Ellarose Brandi W Alphonzo Devera, LCSWA 08/18/2024  4:15 PM

## 2024-08-18 NOTE — Progress Notes (Signed)
 [name] MD Progress Note  08/18/2024 5:59 PM Benjamin [name]  MRN:  [id number] Benjamin [name] is a 78 year old male who presents to the inpatient geriatric psych unit after jumping from a two story building. Patient was originally seen at [facility] who referred him to the ED who admitted him here on 03/27/2024. Patient reports that people from the [place] gang were trying to kill him and the only escape was through the bedroom window on the second floor. He currently lives in the house with [name] and his family and has been living with them for 3-4 years without any problems. On 03/19/2024 he went to the doctor and when he got back home the family wouldn't let him leave. He states that after he realized the gang was going to kill him he barricaded the door with his bed and jumped out the window. When he landed he ran away until one of his neighbors found him and called 911. Patient is admitted to [facility] unit with Q15 min safety monitoring. Multidisciplinary team approach is offered. Medication management; group/milieu therapy is offered.   Subjective: Chart is reviewed and discussed with the treatment team.  APS is working on legal guardianship and placement.    The patient reported not hearing voices or seeing things. They mentioned the need for sneakers and believed they were receiving appropriate care. The patient was oriented to the date being close to Christmas but was initially incorrect about the exact date. They described their mood as okay and did not report any pain. The patient did not mention any new allergies, new medications, or physical symptoms. There were no thoughts of self-harm or harm to others. Per nursing report, the patient is doing well. No concerns reported. Sleeping well, eating well.    Psychiatric History: see h&P  Family History:  Family History  Problem Relation Age of Onset   Colon cancer Neg Hx    Colon polyps Neg Hx    Stomach cancer Neg Hx     Esophageal cancer Neg Hx    Social History:  Social History   Substance and Sexual Activity  Alcohol Use No     Social History   Substance and Sexual Activity  Drug Use No    Social History   Socioeconomic History   Marital status: Single    Spouse name: Not on file   Number of children: Not on file   Years of education: Not on file   Highest education level: Not on file  Occupational History   Not on file  Tobacco Use   Smoking status: Former    Types: Cigarettes   Smokeless tobacco: Never  Vaping Use   Vaping status: Never Used  Substance and Sexual Activity   Alcohol use: No   Drug use: No   Sexual activity: Not on file  Other Topics Concern   Not on file  Social History Narrative   Not on file   Social Drivers of Health   Tobacco Use: Medium Risk (03/27/2024)   Patient History    Smoking Tobacco Use: Former    Smokeless Tobacco Use: Never    Passive Exposure: Not on Actuary Strain: Not on file  Food Insecurity: No Food Insecurity (03/27/2024)   Epic    Worried About Radiation Protection Practitioner of Food in the Last Year: Never true    Ran Out of Food in the Last Year: Never true  Transportation Needs: No Transportation Needs (03/27/2024)   Epic  Lack of Transportation (Medical): No    Lack of Transportation (Non-Medical): No  Physical Activity: Not on file  Stress: Not on file  Social Connections: Moderately Integrated (03/27/2024)   Social Connection and Isolation Panel    Frequency of Communication with Friends and Family: Twice a week    Frequency of Social Gatherings with Friends and Family: Once a week    Attends Religious Services: 1 to 4 times per year    Active Member of [employer] or Organizations: No    Attends Banker Meetings: 1 to 4 times per year    Marital Status: Divorced  Depression (PHQ2-9): Not on file  Alcohol Screen: Low Risk (03/27/2024)   Alcohol Screen    Last Alcohol Screening Score (AUDIT): 0  Housing: Low Risk  (03/27/2024)   Epic    Unable to Pay for Housing in the Last Year: No    Number of Times Moved in the Last Year: 0    Homeless in the Last Year: No  Utilities: Not At Risk (03/27/2024)   Epic    Threatened with loss of utilities: No  Health Literacy: Not on file   Past Medical History:  Past Medical History:  Diagnosis Date   Allergy    Arthritis    back   Prostate cancer (HCC) dx'd 2010   surg only    Past Surgical History:  Procedure Laterality Date   COLONOSCOPY     EYE SURGERY     PROSTATE SURGERY      Current Medications: Current Facility-Administered Medications  Medication Dose Route Frequency Provider Last Rate Last Admin   acetaminophen  (TYLENOL ) tablet 650 mg  650 mg Oral Q6H PRN Coleman, Carolyn H, NP       alum & mag hydroxide-simeth (MAALOX/MYLANTA) 200-200-20 MG/5ML suspension 30 mL  30 mL Oral Q4H PRN Coleman, Carolyn H, NP       docusate sodium  (COLACE) capsule 100 mg  100 mg Oral Daily Jadapalle, Sree, MD   100 mg at 08/18/24 9072   fluPHENAZine  (PROLIXIN ) tablet 10 mg  10 mg Oral BID Lisa Blakeman, MD   10 mg at 08/18/24 9072   magnesium  hydroxide (MILK OF MAGNESIA) suspension 30 mL  30 mL Oral Daily PRN Mardy Elveria DEL, NP       OLANZapine  (ZYPREXA ) injection 5 mg  5 mg Intramuscular TID PRN Mardy Elveria DEL, NP   5 mg at 04/12/24 2230   OLANZapine  zydis (ZYPREXA ) disintegrating tablet 5 mg  5 mg Oral TID PRN Coleman, Carolyn H, NP   5 mg at 07/22/24 0034   polyethylene glycol (MIRALAX  / GLYCOLAX ) packet 17 g  17 g Oral Daily PRN Donnelly Mellow, MD   17 g at 06/04/24 2123    Lab Results:  No results found for this or any previous visit (from the past 48 hours).      Blood Alcohol level:  Lab Results  Component Value Date   [facility] <15 03/26/2024    Metabolic Disorder Labs: Lab Results  Component Value Date   HGBA1C 5.6 04/02/2024   MPG 114 04/02/2024   No results found for: PROLACTIN Lab Results  Component Value Date   CHOL 146  04/02/2024   TRIG 37 04/02/2024   HDL 43 04/02/2024   CHOLHDL 3.4 04/02/2024   VLDL 7 04/02/2024   LDLCALC 96 04/02/2024    Physical Findings: AIMS:  , ,  ,  ,    CIWA:    COWS:  Psychiatric Specialty Exam:  Presentation  General Appearance:  Appropriate for Environment; Bizarre  Eye Contact: Fair  Speech: Normal rate  Speech Volume: Normal    Mood and Affect  Mood: Fine Affect: Congruent   Thought Process  Thought Processes: Disorganized At baseline Descriptions of Associations:Intact  Orientation:Full (Time, Place and Person)  Thought Content: Impoverished Hallucinations: Denies  Ideas of Reference: denies Suicidal Thoughts: Denies  Homicidal Thoughts: Denies   Sensorium  Memory: impiared  Judgment: Impaired  Insight: Community Education Officer  Concentration: Fair  Attention Span: Fair  Recall: [employer] of Knowledge: Fair  Language: Fair   Psychomotor Activity  Psychomotor Activity: No data recorded  Musculoskeletal: Strength & Muscle Tone: within normal limits Gait & Station: normal Assets  Assets: Manufacturing Systems Engineer; Resilience    Physical Exam: Physical Exam Vitals and nursing note reviewed.    ROS Blood pressure 125/84, pulse 65, temperature 98.1 F (36.7 C), resp. rate 17, height 6' (1.829 m), weight 100 kg, SpO2 99%. Body mass index is 29.91 kg/m.  Diagnosis: Principal Problem:   Delusional disorder (HCC) Active Problems:   Laceration of left eyebrow   Major neurocognitive disorder [facility])  Major Neurocognitive disorder  Clinical Decision Making: Patient currently admitted after jumping off a two-story building in the context of possible delusions as reported as the people in the house he lived for 4 years trying to kill him.  Patient needs to be monitored closely for ongoing psychosis and paranoid delusions.  Given patient's confusion and chronic paranoia, no family support, APS has  taken up guardianship and looking for placement     Safety and Monitoring:             -- InVoluntary admission to inpatient psychiatric unit for safety, stabilization and treatment             -- Daily contact with patient to assess and evaluate symptoms and progress in treatment             -- Patient's case to be discussed in multi-disciplinary team meeting             -- Observation Level: q15 minute checks             -- Vital signs:  q12 hours             -- Precautions: suicide, elopement, and assault   2. Psychiatric Diagnoses and Treatment:              Continue Prolixin  to 10 mg twice daily.  -- The risks/benefits/side-effects/alternatives to this medication were discussed in detail with the patient and time was given for questions. The patient consents to medication trial.                -- Metabolic profile and EKG monitoring obtained while on an atypical antipsychotic (BMI: Lipid Panel: HbgA1c: QTc:)              -- Encouraged patient to participate in unit milieu and in scheduled group therapies                            3. Medical Issues Being Addressed:    Fall on/8/25, CT head normal  4. Discharge Planning:   -- Social work and case management to assist with discharge planning and identification of hospital follow-up needs prior to discharge  -- Estimated LOS: 3-4 days  Desmond Chimera, MD 08/18/2024, 5:59 PM

## 2024-08-18 NOTE — Plan of Care (Signed)
  Problem: Activity: Goal: Interest or engagement in activities will improve Outcome: Progressing Goal: Sleeping patterns will improve Outcome: Progressing   Problem: Coping: Goal: Ability to verbalize frustrations and anger appropriately will improve Outcome: Progressing Goal: Ability to demonstrate self-control will improve Outcome: Progressing   Problem: Safety: Goal: Periods of time without injury will increase Outcome: Progressing

## 2024-08-18 NOTE — Group Note (Signed)
 Date:  08/18/2024 Time:  10:57 AM  Group Topic/Focus:  Wellness Toolbox:   The focus of this group is to discuss various aspects of wellness, balancing those aspects and exploring ways to increase the ability to experience wellness.  Patients will create a wellness toolbox for use upon discharge.  I had the opportunity to go over the holiday and New Year activity sheets with each patient individually. The activity sheets provided patients with an opportunity to express personal memories, reflect on meaningful life experiences, and share favorite traditions, movies, and meals. Patients were also encouraged to discuss gentle New Year plans and goals, focusing on realistic and positive intentions for the upcoming year.  The activities supported cognitive engagement through word searches, memory recall, simple problem-solving, and reflection exercises. Patients were able to participate at their own pace, and assistance was provided as needed. The activity sheets also included a brief, gentle movement component, allowing patients to engage in light physical activity appropriate to their abilities.  Overall, the activity promoted emotional expression, social interaction, and mental stimulation in a calm and supportive manner. Patients appeared engaged and receptive, and the activity provided a meaningful opportunity for conversation, self-expression, and encouragement of overall well-being.  Participation Level:  Minimal  Participation Quality:  Attentive  Affect:  Appropriate  Cognitive:  Appropriate and Confused  Insight: Appropriate and Limited  Engagement in Group:  Limited  Modes of Intervention:  Activity and Discussion  Additional Comments:    Delorse Shane L Tamie Minteer 08/18/2024, 10:57 AM

## 2024-08-19 DIAGNOSIS — F22 Delusional disorders: Secondary | ICD-10-CM | POA: Diagnosis not present

## 2024-08-19 NOTE — Progress Notes (Signed)
 [facility] MD Progress Note  08/19/2024 11:49 AM Benjamin [name]  MRN:  [id number] Benjamin [name] is a 78 year old male who presents to the inpatient geriatric psych unit after jumping from a two story building. Patient was originally seen at [facility] who referred him to the ED who admitted him here on 03/27/2024. Patient reports that people from the [place] gang were trying to kill him and the only escape was through the bedroom window on the second floor. He currently lives in the house with Benjamin [name] and his family and has been living with them for 3-4 years without any problems. On 03/19/2024 he went to the doctor and when he got back home the family wouldn't let him leave. He states that after he realized the gang was going to kill him he barricaded the door with his bed and jumped out the window. When he landed he ran away until one of his neighbors found him and called 911. Patient is admitted to [facility] unit with Q15 min safety monitoring. Multidisciplinary team approach is offered. Medication management; group/milieu therapy is offered.   Subjective: Chart is reviewed and discussed with the treatment team.  APS is working on legal guardianship and placement.    Patient continues to have poor hygiene.  Disheveled.  Patient was noted to be wearing shirt inwards out. Patient reported not hearing voices or seeing things at the time of the encounter. The patient reported doing well overall, with no issues with sleep or appetite. The patient denied suicidal ideation and homicidal ideation. The RN reported some concerns regarding the patients confusion, however, the patient was oriented during todays assessment. Will continue to monitor.    Psychiatric History: see h&P  Family History:  Family History  Problem Relation Age of Onset   Colon cancer Neg Hx    Colon polyps Neg Hx    Stomach cancer Neg Hx    Esophageal cancer Neg Hx    Social History:  Social History   Substance  and Sexual Activity  Alcohol Use No     Social History   Substance and Sexual Activity  Drug Use No    Social History   Socioeconomic History   Marital status: Single    Spouse name: Not on file   Number of children: Not on file   Years of education: Not on file   Highest education level: Not on file  Occupational History   Not on file  Tobacco Use   Smoking status: Former    Types: Cigarettes   Smokeless tobacco: Never  Vaping Use   Vaping status: Never Used  Substance and Sexual Activity   Alcohol use: No   Drug use: No   Sexual activity: Not on file  Other Topics Concern   Not on file  Social History Narrative   Not on file   Social Drivers of Health   Tobacco Use: Medium Risk (03/27/2024)   Patient History    Smoking Tobacco Use: Former    Smokeless Tobacco Use: Never    Passive Exposure: Not on Actuary Strain: Not on file  Food Insecurity: No Food Insecurity (03/27/2024)   Epic    Worried About Programme Researcher, Broadcasting/film/video in the Last Year: Never true    Ran Out of Food in the Last Year: Never true  Transportation Needs: No Transportation Needs (03/27/2024)   Epic    Lack of Transportation (Medical): No    Lack of Transportation (Non-Medical): No  Physical  Activity: Not on file  Stress: Not on file  Social Connections: Moderately Integrated (03/27/2024)   Social Connection and Isolation Panel    Frequency of Communication with Friends and Family: Twice a week    Frequency of Social Gatherings with Friends and Family: Once a week    Attends Religious Services: 1 to 4 times per year    Active Member of [employer] or Organizations: No    Attends Banker Meetings: 1 to 4 times per year    Marital Status: Divorced  Depression (PHQ2-9): Not on file  Alcohol Screen: Low Risk (03/27/2024)   Alcohol Screen    Last Alcohol Screening Score (AUDIT): 0  Housing: Low Risk (03/27/2024)   Epic    Unable to Pay for Housing in the Last Year: No    Number of  Times Moved in the Last Year: 0    Homeless in the Last Year: No  Utilities: Not At Risk (03/27/2024)   Epic    Threatened with loss of utilities: No  Health Literacy: Not on file   Past Medical History:  Past Medical History:  Diagnosis Date   Allergy    Arthritis    back   Prostate cancer (HCC) dx'd 2010   surg only    Past Surgical History:  Procedure Laterality Date   COLONOSCOPY     EYE SURGERY     PROSTATE SURGERY      Current Medications: Current Facility-Administered Medications  Medication Dose Route Frequency Provider Last Rate Last Admin   acetaminophen  (TYLENOL ) tablet 650 mg  650 mg Oral Q6H PRN Coleman, Carolyn H, NP       alum & mag hydroxide-simeth (MAALOX/MYLANTA) 200-200-20 MG/5ML suspension 30 mL  30 mL Oral Q4H PRN Coleman, Carolyn H, NP       docusate sodium  (COLACE) capsule 100 mg  100 mg Oral Daily Jadapalle, Sree, MD   100 mg at 08/19/24 9078   fluPHENAZine  (PROLIXIN ) tablet 10 mg  10 mg Oral BID Tana Trefry, MD   10 mg at 08/19/24 9078   magnesium  hydroxide (MILK OF MAGNESIA) suspension 30 mL  30 mL Oral Daily PRN Mardy Elveria DEL, NP       OLANZapine  (ZYPREXA ) injection 5 mg  5 mg Intramuscular TID PRN Mardy Elveria DEL, NP   5 mg at 04/12/24 2230   OLANZapine  zydis (ZYPREXA ) disintegrating tablet 5 mg  5 mg Oral TID PRN Coleman, Carolyn H, NP   5 mg at 07/22/24 0034   polyethylene glycol (MIRALAX  / GLYCOLAX ) packet 17 g  17 g Oral Daily PRN Donnelly Mellow, MD   17 g at 06/04/24 2123    Lab Results:  No results found for this or any previous visit (from the past 48 hours).      Blood Alcohol level:  Lab Results  Component Value Date   [facility] <15 03/26/2024    Metabolic Disorder Labs: Lab Results  Component Value Date   HGBA1C 5.6 04/02/2024   MPG 114 04/02/2024   No results found for: PROLACTIN Lab Results  Component Value Date   CHOL 146 04/02/2024   TRIG 37 04/02/2024   HDL 43 04/02/2024   CHOLHDL 3.4 04/02/2024    VLDL 7 04/02/2024   LDLCALC 96 04/02/2024    Physical Findings: AIMS:  , ,  ,  ,    CIWA:    COWS:      Psychiatric Specialty Exam:  Presentation  General Appearance:  Appropriate for Environment; Bizarre  Eye Contact: Fair  Speech: Normal rate  Speech Volume: Normal    Mood and Affect  Mood: Fine Affect: Congruent   Thought Process  Thought Processes: Disorganized At baseline Descriptions of Associations:Intact  Orientation:Full (Time, Place and Person)  Thought Content: Impoverished Hallucinations: Denies  Ideas of Reference: denies Suicidal Thoughts: Denies  Homicidal Thoughts: Denies   Sensorium  Memory: impiared  Judgment: Impaired  Insight: Community Education Officer  Concentration: Fair  Attention Span: Fair  Recall: [employer] of Knowledge: Fair  Language: Fair   Psychomotor Activity  Psychomotor Activity: No data recorded  Musculoskeletal: Strength & Muscle Tone: within normal limits Gait & Station: normal Assets  Assets: Manufacturing Systems Engineer; Resilience    Physical Exam: Physical Exam Vitals and nursing note reviewed.    ROS Blood pressure 123/77, pulse 64, temperature (!) 97 F (36.1 C), resp. rate 16, height 6' (1.829 m), weight 100 kg, SpO2 97%. Body mass index is 29.91 kg/m.  Diagnosis: Principal Problem:   Delusional disorder (HCC) Active Problems:   Laceration of left eyebrow   Major neurocognitive disorder [name])  Major Neurocognitive disorder  Clinical Decision Making: Patient currently admitted after jumping off a two-story building in the context of possible delusions as reported as the people in the house he lived for 4 years trying to kill him.  Patient needs to be monitored closely for ongoing psychosis and paranoid delusions.  Given patient's confusion and chronic paranoia, no family support, APS has taken up guardianship and looking for placement     Safety and Monitoring:              -- InVoluntary admission to inpatient psychiatric unit for safety, stabilization and treatment             -- Daily contact with patient to assess and evaluate symptoms and progress in treatment             -- Patient's case to be discussed in multi-disciplinary team meeting             -- Observation Level: q15 minute checks             -- Vital signs:  q12 hours             -- Precautions: suicide, elopement, and assault   2. Psychiatric Diagnoses and Treatment:              Continue Prolixin  to 10 mg twice daily.  -- The risks/benefits/side-effects/alternatives to this medication were discussed in detail with the patient and time was given for questions. The patient consents to medication trial.                -- Metabolic profile and EKG monitoring obtained while on an atypical antipsychotic (BMI: Lipid Panel: HbgA1c: QTc:)              -- Encouraged patient to participate in unit milieu and in scheduled group therapies                            3. Medical Issues Being Addressed:    Fall on/8/25, CT head normal  4. Discharge Planning:   -- Social work and case management to assist with discharge planning and identification of hospital follow-up needs prior to discharge  -- Estimated LOS: 3-4 days  Desmond Chimera, MD 08/19/2024, 11:49 AM

## 2024-08-19 NOTE — Plan of Care (Signed)
" °  Problem: Clinical Measurements: Goal: Ability to maintain clinical measurements within normal limits will improve Outcome: Progressing Goal: Will remain free from infection Outcome: Progressing   Problem: Nutrition: Goal: Adequate nutrition will be maintained Outcome: Progressing   Problem: Coping: Goal: Level of anxiety will decrease Outcome: Progressing   Problem: Health Behavior/Discharge Planning: Goal: Ability to manage health-related needs will improve Outcome: Not Progressing   "

## 2024-08-19 NOTE — Progress Notes (Signed)
 D- Patient alert and oriented x 2. Pt presents with a pleasant mood and affect. Pt laughing and joking with clinical research associate. Denies SI, HI, AVH, and pain. Pt states he does not have any goal that he would like to work on today.  A- Scheduled medications administered to patient, per MD orders. Support and encouragement provided.  Routine safety checks conducted every 15 minutes.  Patient informed to notify staff with problems or concerns.  R- No adverse drug reactions noted. Patient contracts for safety at this time. Patient compliant with medications and treatment plan. Patient receptive, calm, and cooperative. Patient interacts well with others on the unit.  Patient remains safe at this time.    Leeman Johnsey S.,RN

## 2024-08-19 NOTE — Plan of Care (Signed)
  Problem: Coping: Goal: Ability to verbalize frustrations and anger appropriately will improve Outcome: Progressing Goal: Ability to demonstrate self-control will improve Outcome: Progressing   Problem: Health Behavior/Discharge Planning: Goal: Identification of resources available to assist in meeting health care needs will improve Outcome: Progressing Goal: Compliance with treatment plan for underlying cause of condition will improve Outcome: Progressing   Problem: Physical Regulation: Goal: Ability to maintain clinical measurements within normal limits will improve Outcome: Progressing   Problem: Safety: Goal: Periods of time without injury will increase Outcome: Progressing   

## 2024-08-19 NOTE — Group Note (Signed)
 Date:  08/19/2024 Time:  3:59 PM  Group Topic/Focus:  Karaoke Group: The purpose of this group is for patients to interact with each other while singing their favorite songs.     Participation Level:  None  Benjamin [name] [name] 08/19/2024, 3:59 PM

## 2024-08-19 NOTE — Progress Notes (Signed)
 Pleasant and cooperative w/ some confusion. Flat affect. Pt observed wearing shirt inside out and backwards; disheveled and malodorous. Incont of urine. Refused shower. Requires min assist with ADLs. Limited interaction with peers and staff. Partial participation in group. PO med compliant. No PRNs given. Denies SI/HI/AVH. Q 15 min checks provided. No behavioral issues noted. Slept 11 hours.   08/18/24 2100  Psych Admission Type (Psych Patients Only)  Admission Status Involuntary  Psychosocial Assessment  Patient Complaints Confusion  Eye Contact Fair  Facial Expression Flat  Affect Flat  Speech Soft  Interaction Minimal  Motor Activity Slow  Appearance/Hygiene In scrubs;Disheveled;Body odor  Behavior Characteristics Cooperative  Mood Pleasant  Thought Process  Coherency WDL  Content WDL  Delusions None reported or observed  Perception WDL  Hallucination None reported or observed  Judgment Impaired  Confusion Moderate  Danger to Self  Current suicidal ideation? Denies

## 2024-08-19 NOTE — Group Note (Signed)
 Date:  08/19/2024 Time:  11:05 AM  Group Topic/Focus:  Coping With Mental Health Crisis:   The purpose of this group is to help patients identify strategies for coping with mental health crisis.  Group discusses possible causes of crisis and ways to manage them effectively.    Participation Level:  Did Not Attend    Benjamin [name] 08/19/2024, 11:05 AM

## 2024-08-19 NOTE — Group Note (Signed)
 Date:  08/19/2024 Time:  9:58 PM  Group Topic/Focus:  Wrap-Up Group:   The focus of this group is to help patients review their daily goal of treatment and discuss progress on daily workbooks.    Participation Level:  Did Not Attend  Participation Quality:     Affect:     Cognitive:     Insight: None  Engagement in Group:  None  Modes of Intervention:     Additional Comments:    [name] 08/19/2024, 9:58 PM

## 2024-08-20 DIAGNOSIS — F22 Delusional disorders: Secondary | ICD-10-CM | POA: Diagnosis not present

## 2024-08-20 NOTE — Progress Notes (Signed)
 D- Patient alert and oriented x1. Pt presents with a pleasant mood and affect. Pt states he slept well and denies SI, HI, AVH, and pain. Pt states he does not have a goal for today.  A- Scheduled medications administered to patient, per MD orders. Support and encouragement provided.  Routine safety checks conducted every 15 minutes.  Patient informed to notify staff with problems or concerns.  R- No adverse drug reactions noted. Patient contracts for safety at this time. Patient compliant with medications and treatment plan. Patient receptive, calm, and cooperative. Patient interacts well with others on the unit.  Patient remains safe at this time.   Treveon Bourcier S.,RN

## 2024-08-20 NOTE — BH IP Treatment Plan (Signed)
 Interdisciplinary Treatment and Diagnostic Plan Update  08/20/2024 Time of Session: 8:00 AM  Benjamin [name] MRN: [id number]  Principal Diagnosis: Delusional disorder [facility])  Secondary Diagnoses: Principal Problem:   Delusional disorder (HCC) Active Problems:   Laceration of left eyebrow   Major neurocognitive disorder (HCC)   Current Medications:  Current Facility-Administered Medications  Medication Dose Route Frequency Provider Last Rate Last Admin   acetaminophen  (TYLENOL ) tablet 650 mg  650 mg Oral Q6H PRN Coleman, Carolyn H, NP       alum & mag hydroxide-simeth (MAALOX/MYLANTA) 200-200-20 MG/5ML suspension 30 mL  30 mL Oral Q4H PRN Coleman, Carolyn H, NP       docusate sodium  (COLACE) capsule 100 mg  100 mg Oral Daily Jadapalle, Sree, MD   100 mg at 08/20/24 9088   fluPHENAZine  (PROLIXIN ) tablet 10 mg  10 mg Oral BID Shrivastava, Aryendra, MD   10 mg at 08/20/24 9088   magnesium  hydroxide (MILK OF MAGNESIA) suspension 30 mL  30 mL Oral Daily PRN Coleman, Carolyn H, NP       OLANZapine  (ZYPREXA ) injection 5 mg  5 mg Intramuscular TID PRN Mardy Elveria DEL, NP   5 mg at 04/12/24 2230   OLANZapine  zydis (ZYPREXA ) disintegrating tablet 5 mg  5 mg Oral TID PRN Coleman, Carolyn H, NP   5 mg at 07/22/24 0034   polyethylene glycol (MIRALAX  / GLYCOLAX ) packet 17 g  17 g Oral Daily PRN Jadapalle, Sree, MD   17 g at 06/04/24 2123   PTA Medications: Medications Prior to Admission  Medication Sig Dispense Refill Last Dose/Taking   docusate sodium  (COLACE) 100 MG capsule Take 100 mg by mouth daily.      omega-3 acid ethyl esters (LOVAZA) 1 g capsule Take 1 g by mouth daily.       Patient Stressors: Traumatic event    Patient Strengths: Communication skills   Treatment Modalities: Medication Management, Group therapy, Case management,  1 to 1 session with clinician, Psychoeducation, Recreational therapy.   Physician Treatment Plan for Primary Diagnosis: Delusional disorder  [facility]) Long Term Goal(s): Improvement in symptoms so as ready for discharge   Short Term Goals: Ability to identify changes in lifestyle to reduce recurrence of condition will improve Ability to verbalize feelings will improve Ability to disclose and discuss suicidal ideas Ability to demonstrate self-control will improve Ability to identify and develop effective coping behaviors will improve  Medication Management: Evaluate patient's response, side effects, and tolerance of medication regimen.  Therapeutic Interventions: 1 to 1 sessions, Unit Group sessions and Medication administration.  Evaluation of Outcomes: Progressing  Physician Treatment Plan for Secondary Diagnosis: Principal Problem:   Delusional disorder (HCC) Active Problems:   Laceration of left eyebrow   Major neurocognitive disorder (HCC)  Long Term Goal(s): Improvement in symptoms so as ready for discharge   Short Term Goals: Ability to identify changes in lifestyle to reduce recurrence of condition will improve Ability to verbalize feelings will improve Ability to disclose and discuss suicidal ideas Ability to demonstrate self-control will improve Ability to identify and develop effective coping behaviors will improve     Medication Management: Evaluate patient's response, side effects, and tolerance of medication regimen.  Therapeutic Interventions: 1 to 1 sessions, Unit Group sessions and Medication administration.  Evaluation of Outcomes: Progressing   RN Treatment Plan for Primary Diagnosis: Delusional disorder (HCC) Long Term Goal(s): Knowledge of disease and therapeutic regimen to maintain health will improve  Short Term Goals: Ability to remain free from injury  will improve, Ability to verbalize frustration and anger appropriately will improve, Ability to demonstrate self-control, Ability to participate in decision making will improve, Ability to verbalize feelings will improve, Ability to disclose and  discuss suicidal ideas, Ability to identify and develop effective coping behaviors will improve, and Compliance with prescribed medications will improve  Medication Management: RN will administer medications as ordered by provider, will assess and evaluate patient's response and provide education to patient for prescribed medication. RN will report any adverse and/or side effects to prescribing provider.  Therapeutic Interventions: 1 on 1 counseling sessions, Psychoeducation, Medication administration, Evaluate responses to treatment, Monitor vital signs and CBGs as ordered, Perform/monitor CIWA, COWS, AIMS and Fall Risk screenings as ordered, Perform wound care treatments as ordered.  Evaluation of Outcomes: Progressing   LCSW Treatment Plan for Primary Diagnosis: Delusional disorder [facility]) Long Term Goal(s): Safe transition to appropriate next level of care at discharge, Engage patient in therapeutic group addressing interpersonal concerns.  Short Term Goals: Engage patient in aftercare planning with referrals and resources, Increase social support, Increase ability to appropriately verbalize feelings, Increase emotional regulation, Facilitate acceptance of mental health diagnosis and concerns, Facilitate patient progression through stages of change regarding substance use diagnoses and concerns, Identify triggers associated with mental health/substance abuse issues, and Increase skills for wellness and recovery  Therapeutic Interventions: Assess for all discharge needs, 1 to 1 time with Social worker, Explore available resources and support systems, Assess for adequacy in community support network, Educate family and significant other(s) on suicide prevention, Complete Psychosocial Assessment, Interpersonal group therapy.  Evaluation of Outcomes: Progressing   Progress in Treatment: Attending groups: Yes and No.  Participating in groups: Yes and No.  Taking medication as prescribed:  Yes. Toleration medication: Yes.  Family/Significant other contact made: [name], brother, [phone]  Patient understands diagnosis: No.   Discussing patient identified problems/goals with staff: Yes.  Medical problems stabilized or resolved: Yes.  Denies suicidal/homicidal ideation: Yes.  Issues/concerns per patient self-inventory: No.  Other: None    New problem(s) identified:  No, Describe:  05/14/24 Update: None  05/24/24 Update: No changes at this time.  Update 05/29/2024:  No changes at this time.  Update 06/03/2024:  No changes at this time. Update 06/08/24: No changes at this time Update 06/13/24: No changes at this time  Update 06/18/24: No changes at this time Update 06/23/24: No changes at this time  Update 06/28/24: No changes at this time Update 07/03/24: No changes at this time Update 07/09/24: No changes at this time Update 07/14/24: No changes at this time  Update 07/19/24: No changes at this time Update 07/25/24: No changes at this time Update 07/30/24: No changes at this time Update:08/04/24: No changes at this time. 08/09/24 Update: No changes at this time. Update 08/14/24: No changes at this time Update 08/20/24: No changes at this time     New Short Term/Long Term Goal(s): elimination of symptoms of psychosis, medication management for mood stabilization; elimination of SI thoughts; development of comprehensive mental wellness plan. Update 04/02/24: No changes at this time. Update 04/07/24: No changes at this time. Update 04/12/24: No changes at this time Update 04/18/24: No changes at this time  Update 04/23/24: No changes at this time. Update 04/28/2024: No changes at this time.  Update 05/04/2024: No changes at this time.  Update 05/09/2024:  No changes at this time. 05/14/24 Update: No changes at this time. 05/19/24 Update: No changes at this time. 05/24/24 Update: No changes at this time.  Update 05/29/2024:  No changes at this time. Update 06/03/2024:  No changes at this time.  Update 06/08/24: No changes at this time Update 06/13/24: No changes at this time Update 06/18/24: No changes at this time Update 06/23/24: No changes at this time Update 06/28/24: No changes at this time  Update 07/03/24: No changes at this time Update 07/09/24: No changes at this time Update 07/14/24: No changes at this time Update 07/19/24: No changes at this time Update 07/25/24: No changes at this time Update 07/30/24: No changes at this time Update 08/04/24: No changes at this time. 08/09/24 Update: No changes at this time. Update 08/14/24: No changes at this time  Update 08/20/24: No changes at this time     Patient Goals:   Get out of here, I want to get healed up so I can get out of here Update 04/02/24: No changes at this time. Update 04/07/24: No changes at this time .Update 04/12/24: No changes at this time Update 04/18/24: No changes at this time  Update 04/23/24: No changes at this time. Update 04/28/2024: No changes at this time. Update 05/04/2024: No changes at this time.   Update 05/29/2024:  No changes at this time.  Update 06/03/2024:  No changes at this time. Update 06/08/24: No changes at this time Update 06/13/24: No changes at this time Update 06/18/24: No changes at this time Update 06/23/24: No changes at this time Update 06/28/24: No changes at this time  Update 07/03/24: No changes at this time Update 07/09/24: No changes at this time  Update 07/14/24: No changes at this time Update 07/19/24: No changes at this time Update 07/25/24: No changes at this time Update 07/30/24: No changes at this time Update 08/04/24 No changes at this time. 08/09/24 Update: No changes at this time. Update 08/14/24: No changes at this time Update 08/20/24: No changes at this time     Discharge Plan or Barriers: CSW will assist with appropriate discharge planning  Update 04/02/24: No changes at this time. Update 04/07/24: No changes at this time.Update 04/12/24: No changes at this time Update 04/18/24: CSW submitted  report to APS. Care team looking at guardianship for pt at this time  Update 04/23/24: No changes at this time. Update 04/28/2024: No changes at this time. Update 05/04/2024: No changes at this time.  Update 05/09/2024:  [name] APS continues to search for placement. 05/14/24 Update: [facility] APS continues to look for placement. CSW to continue to assess. 05/19/24 Update: No changes at this time. 05/24/24 Update:DSS continues to look for placement at this time Update 05/29/2024:  Patient remains on the unit and safe at this time.  Case was accepted by APS [facility] and they are looking for placement for the patient.   Update 06/03/2024:  No changes at this time. Update 06/08/24: No changes at this time Update 06/13/24: [facility] APS having difficulty connecting with pt's care coordinator at [facility] to assist with placement Update 06/18/24: No changes at this time Update 06/23/24: [facility] reports that she may have found placement for pt. CSW will send TB tests per her request Update 06/28/24: Arch reports pt currently awaiting approval of 1915[facility]. Reports she should know more about placement next week.  Update 07/03/24: No changes at this time Update 07/09/24: Pt still pending placement according to APS. Pt's 1915[facility] is also still pending according to APS. Update 07/14/24: No changes at this time Update 07/19/24: No changes at this time Update 07/25/24: No  changes at this time Update 07/30/24: According to APS, 1915[facility] has been approved and they will move forward with placement. APS also reports pt's CCA is pending through [employer] and that should be completed today as well should current placement option fall through. 08/04/24 No changes at this time. 08/09/24 Update: Updated FL2 sent to [name] with DSS for skilled nursing and assisted living centers following changes in patient's disposition. Update 08/14/24: No changes at this time  Update 08/20/24: No changes at this time      Reason for Continuation of Hospitalization: Delusions  Medication stabilization   Estimated Length of Stay:Estimated Length of Stay: 1 to 7 days Update 04/02/24: TBD. Update 04/07/24: TBD Update 04/12/24:TBD. Update 04/28/24:TBD Update 05/04/2024: TBD Update 05/09/2024:  TBD  05/14/24 Update: TBD. 05/19/24 Update: TBD. 05/24/24 Update: TBD  Update 05/29/2024:  TBD.  Update 06/03/2024:  TBD Update 06/08/24: TBD Update 06/13/24: TBD Update 06/18/24: TBD Update 06/23/24: TBD Update 06/28/24: TBD Update 07/03/24: TBD Update 07/09/24: TBD  Update 07/14/24: TBD Update 07/19/24: TBD Update 07/25/24: TBD Update 07/30/24: TBD  08/04/24: TBD 08/09/24 Update: TBD. Update 08/14/24: TBD Update 08/20/24: TBD  Last 3 [place] Suicide Severity Risk Score: Flowsheet Row Admission (Current) from 03/27/2024 in [name] ED from 03/26/2024 in [name] Emergency Department at [facility] ED from 03/25/2024 in [facility]  C-SSRS RISK CATEGORY No Risk No Risk No Risk    Last [name] 2/9 Scores:     No data to display          Scribe for Treatment Team: Lum JONETTA Raynaldo ISRAEL 08/20/2024 3:56 PM

## 2024-08-20 NOTE — Progress Notes (Signed)
" °   08/19/24 2100  Psych Admission Type (Psych Patients Only)  Admission Status Involuntary  Psychosocial Assessment  Patient Complaints Confusion  Eye Contact Fair  Facial Expression Flat  Affect Flat  Speech Soft  Interaction Minimal  Motor Activity Slow  Appearance/Hygiene In scrubs;Unremarkable  Behavior Characteristics Cooperative;Appropriate to situation  Mood Pleasant  Thought Process  Coherency WDL  Content WDL  Delusions None reported or observed  Perception WDL  Hallucination None reported or observed  Judgment Impaired  Confusion Mild  Danger to Self  Current suicidal ideation? Denies  Self-Injurious Behavior No self-injurious ideation or behavior indicators observed or expressed   Agreement Not to Harm Self Yes  Description of Agreement verbal  Danger to Others  Danger to Others None reported or observed   Mood/Behavior:  Pleasant and cooperative.  Mild confusion.    Psych assessment: Denies SI/HI and AVH.     Interaction / Group attendance:  Did not attend group. Minimal interaction with peers and staff.     Medication/ PRNs: Compliant with scheduled medications. No PRNs given.   Pain: Denies   15 min checks in place for safety.   "

## 2024-08-20 NOTE — Group Note (Signed)
 Date:  08/20/2024 Time:  10:43 AM  Group Topic/Focus:    For geriatric mental health patients, effective exercises combine gentle movement, mind-body practices, and social engagement, focusing on activities like walking in nature, yoga, Tai Chi, water  aerobics, and light strength training, which boost mood, reduce anxiety, improve sleep, and fight cognitive decline by increasing blood flow and endorphins, while also providing structure and purpose. Small amounts of daily activity (even 10 minutes) help, with a mix of aerobic, balance, and strength exercises recommended for overall well-being.    Participation Level:  Did Not Attend   [name] Gavel 08/20/2024, 10:43 AM

## 2024-08-20 NOTE — Group Note (Signed)
 Physical/Occupational Therapy Group Note  Group Topic: Yoga  Group Date: 08/20/2024 Start Time: 1305 End Time: 1333 Facilitators: Jasmina Gendron, Alm Hamilton, PT   Group Description: Group participated with series of yoga poses, designed to emphasize functional sitting balance, core stability, generalized flexibility and overall posture.  Incorporated deep breathing techniques with poses, working to promote relaxation, mindfulness and focus with targeted activities.  Discussed benefits of yoga in improving mood and self-esteem, reducing stress and anxiety, and promoting functional strength, balance and core stability for each participant.  Discussed ways to integrate into each participants daily routine.  Provided handout with written and pictorial descriptions of included yoga movements to be utilized as appropriate outside of group time.  Therapeutic Goal(s):  Demonstrate safe ability to participate with yoga poses during group activity. Identify one benefit of participation with yoga poses as part of each participants exercise/movement routine. Identify 1-2 individual poses that participant feels most beneficial to his/her needs and that he/she can easily replicate outside of group.  Individual Participation: Pt actively participated with the discussion and physical activity components of the session and was able with min cuing to modify poses as needed for patient comfort  Participation Level: Active and Engaged   Participation Quality: Minimal Cues   Behavior: Appropriate   Speech/Thought Process: Focused   Affect/Mood: Flat   Insight: Moderate   Judgement: Moderate   Modes of Intervention: Activity, Discussion, and Education  Patient Response to Interventions:  Attentive, Engaged, and Interested    Plan: Continue to engage patient in PT/OT groups 1 - 2x/week.  CHARM Hamilton Bertin PT, DPT 08/20/2024, 1:50 PM

## 2024-08-20 NOTE — Progress Notes (Signed)
 Mobility Specialist - Progress Note   08/20/24 1537  Mobility  Activity Ambulated with assistance  Level of Assistance Standby assist, set-up cues, supervision of patient - no hands on  Assistive Device Front wheel walker  Distance Ambulated (ft) 240 ft  Activity Response Tolerated well  Mobility visit 1 Mobility  Mobility Specialist Start Time (ACUTE ONLY) 1508  Mobility Specialist Stop Time (ACUTE ONLY) 1520  Mobility Specialist Time Calculation (min) (ACUTE ONLY) 12 min   Pt side lying upon entry, utilizing RA. Pt agreeable to OOB amb this date. He completed bed mob w/ SBA, amb ~240 ft in the hallway with close supervision--- VC's for body position within the RW. Pt returned to the room, left supine with needs within reach.  America Silvan Mobility Specialist 08/20/2024 3:41 PM

## 2024-08-20 NOTE — Plan of Care (Signed)
  Problem: Education: Goal: Knowledge of General Education information will improve Description: Including pain rating scale, medication(s)/side effects and non-pharmacologic comfort measures Outcome: Progressing   Problem: Health Behavior/Discharge Planning: Goal: Ability to manage health-related needs will improve Outcome: Progressing   Problem: Clinical Measurements: Goal: Ability to maintain clinical measurements within normal limits will improve Outcome: Progressing Goal: Will remain free from infection Outcome: Progressing Goal: Diagnostic test results will improve Outcome: Progressing Goal: Respiratory complications will improve Outcome: Progressing Goal: Cardiovascular complication will be avoided Outcome: Progressing   Problem: Activity: Goal: Risk for activity intolerance will decrease Outcome: Progressing   Problem: Nutrition: Goal: Adequate nutrition will be maintained Outcome: Progressing   Problem: Coping: Goal: Level of anxiety will decrease Outcome: Progressing   Problem: Elimination: Goal: Will not experience complications related to bowel motility Outcome: Progressing Goal: Will not experience complications related to urinary retention Outcome: Progressing   Problem: Pain Managment: Goal: General experience of comfort will improve and/or be controlled Outcome: Progressing   Problem: Safety: Goal: Ability to remain free from injury will improve Outcome: Progressing   Problem: Skin Integrity: Goal: Risk for impaired skin integrity will decrease Outcome: Progressing   Problem: Education: Goal: Knowledge of [place] [facility] Education information/materials will improve Outcome: Progressing Goal: Emotional status will improve Outcome: Progressing Goal: Mental status will improve Outcome: Progressing Goal: Verbalization of understanding the information provided will improve Outcome: Progressing   Problem: Activity: Goal: Interest or  engagement in activities will improve Outcome: Progressing Goal: Sleeping patterns will improve Outcome: Progressing   Problem: Coping: Goal: Ability to verbalize frustrations and anger appropriately will improve Outcome: Progressing Goal: Ability to demonstrate self-control will improve Outcome: Progressing   Problem: Health Behavior/Discharge Planning: Goal: Identification of resources available to assist in meeting health care needs will improve Outcome: Progressing Goal: Compliance with treatment plan for underlying cause of condition will improve Outcome: Progressing   Problem: Physical Regulation: Goal: Ability to maintain clinical measurements within normal limits will improve Outcome: Progressing   Problem: Safety: Goal: Periods of time without injury will increase Outcome: Progressing

## 2024-08-20 NOTE — Group Note (Signed)
 Recreation Therapy Group Note   Group Topic:Coping Skills  Group Date: 08/20/2024 Start Time: 1415 End Time: 1440 Facilitators: Celestia Jeoffrey BRAVO, LRT, CTRS Location: Courtyard  Group Description: Music. Patients are encouraged to name their favorite song(s) for LRT to play song through speaker for group to hear, while in the courtyard getting fresh air and sunlight. Patients educated on the definition of leisure and the importance of having different leisure interests outside of the hospital. Group discussed how leisure activities can often be used as pharmacologist and that listening to music and being outside are examples.    Goal Area(s) Addressed:  Patient will identify a current leisure interest.  Patient will practice making a positive decision. Patient will have the opportunity to try a new leisure activity.   Affect/Mood: N/A   Participation Level: Did not attend    Clinical Observations/Individualized Feedback: Patient did not attend.  Plan: Continue to engage patient in RT group sessions 2-3x/week.   Jeoffrey BRAVO Celestia, LRT, CTRS 08/20/2024 4:44 PM

## 2024-08-20 NOTE — Plan of Care (Signed)
  Problem: Clinical Measurements: Goal: Ability to maintain clinical measurements within normal limits will improve Outcome: Progressing   Problem: Activity: Goal: Risk for activity intolerance will decrease Outcome: Progressing   Problem: Nutrition: Goal: Adequate nutrition will be maintained Outcome: Progressing   Problem: Coping: Goal: Level of anxiety will decrease Outcome: Progressing   

## 2024-08-20 NOTE — Group Note (Signed)
 Date:  08/20/2024 Time:  11:25 PM  Group Topic/Focus:  Wrap-Up Group:   The focus of this group is to help patients review their daily goal of treatment and discuss progress on daily workbooks.    Participation Level:  Active  Participation Quality:  Appropriate  Affect:  Appropriate  Cognitive:  Alert  Insight: Appropriate  Engagement in Group:  Engaged  Modes of Intervention:  Discussion  Additional Comments:    Benjamin [name] 08/20/2024, 11:25 PM
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
## 2024-08-21 DIAGNOSIS — F22 Delusional disorders: Secondary | ICD-10-CM | POA: Diagnosis not present

## 2024-08-21 DIAGNOSIS — F0390 Unspecified dementia without behavioral disturbance: Secondary | ICD-10-CM | POA: Diagnosis not present
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
## 2024-08-21 NOTE — Group Note (Signed)
 Recreation Therapy Group Note   Group Topic:Health and Wellness  Group Date: 08/21/2024 Start Time: 1425 End Time: 1445 Facilitators: Celestia Jeoffrey BRAVO, LRT, CTRS Location: Craft Room  Group Description: Seated Exercise. LRT discussed the mental and physical benefits of exercise. LRT and group discussed how physical activity can be used as a coping skill. Pt's and LRT followed along to an exercise video on the TV screen that provided a visual representation and audio description of every exercise performed. Pt's encouraged to listen to their bodies and stop at any time if they experience feelings of discomfort or pain. Pts were encouraged to drink water  and stay hydrated.   Goal Area(s) Addressed:  Patient will learn benefits of physical activity. Patient will identify exercise as a coping skill.  Patient will follow multistep directions. Patient will try a new leisure interest.    Affect/Mood: N/A   Participation Level: Did not attend    Clinical Observations/Individualized Feedback: Patient did not attend.  Plan: Continue to engage patient in RT group sessions 2-3x/week.   Jeoffrey BRAVO Celestia, LRT, CTRS 08/21/2024 4:04 PM

## 2024-08-21 NOTE — Plan of Care (Signed)
  Problem: Health Behavior/Discharge Planning: Goal: Ability to manage health-related needs will improve Outcome: Progressing   Problem: Clinical Measurements: Goal: Ability to maintain clinical measurements within normal limits will improve Outcome: Progressing   Problem: Coping: Goal: Level of anxiety will decrease Outcome: Progressing   Problem: Elimination: Goal: Will not experience complications related to bowel motility Outcome: Progressing   Problem: Safety: Goal: Ability to remain free from injury will improve Outcome: Progressing

## 2024-08-21 NOTE — Progress Notes (Signed)
" °   08/21/24 1600  Psych Admission Type (Psych Patients Only)  Admission Status Involuntary  Psychosocial Assessment  Patient Complaints None  Eye Contact Brief  Facial Expression Flat  Affect Appropriate to circumstance  Speech Soft  Interaction Minimal  Motor Activity Slow  Appearance/Hygiene Unremarkable  Behavior Characteristics Cooperative  Mood Pleasant  Thought Process  Coherency WDL  Content WDL  Delusions None reported or observed  Perception WDL  Hallucination None reported or observed  Judgment Impaired  Confusion Mild  Danger to Self  Current suicidal ideation? Denies  Agreement Not to Harm Self Yes  Description of Agreement verbal  Danger to Others  Danger to Others None reported or observed    "

## 2024-08-21 NOTE — Progress Notes (Signed)
 [facility] MD Progress Note  08/21/2024 12:38 PM Junie Engram  MRN:  [id number] Benjamin [name] is a 78 year old male who presents to the inpatient geriatric psych unit after jumping from a two story building. Patient was originally seen at [facility] who referred him to the ED who admitted him here on 03/27/2024. Patient reports that people from the [place] gang were trying to kill him and the only escape was through the bedroom window on the second floor. He currently lives in the house with [name] and his family and has been living with them for 3-4 years without any problems. On 03/19/2024 he went to the doctor and when he got back home the family wouldn't let him leave. He states that after he realized the gang was going to kill him he barricaded the door with his bed and jumped out the window. When he landed he ran away until one of his neighbors found him and called 911. Patient is admitted to [facility] unit with Q15 min safety monitoring. Multidisciplinary team approach is offered. Medication management; group/milieu therapy is offered.   Subjective: Chart is reviewed and discussed with the treatment team.  APS is working on legal guardianship and placement.    Per nursing, patient remained stable.  Slept okay.  Patient eating well sleeping well.  Patient is psychiatrically stable waiting for placement.   Psychiatric History: see h&P  Family History:  Family History  Problem Relation Age of Onset   Colon cancer Neg Hx    Colon polyps Neg Hx    Stomach cancer Neg Hx    Esophageal cancer Neg Hx    Social History:  Social History   Substance and Sexual Activity  Alcohol Use No     Social History   Substance and Sexual Activity  Drug Use No    Social History   Socioeconomic History   Marital status: Single    Spouse name: Not on file   Number of children: Not on file   Years of education: Not on file   Highest education level: Not on file  Occupational  History   Not on file  Tobacco Use   Smoking status: Former    Types: Cigarettes   Smokeless tobacco: Never  Vaping Use   Vaping status: Never Used  Substance and Sexual Activity   Alcohol use: No   Drug use: No   Sexual activity: Not on file  Other Topics Concern   Not on file  Social History Narrative   Not on file   Social Drivers of Health   Tobacco Use: Medium Risk (03/27/2024)   Patient History    Smoking Tobacco Use: Former    Smokeless Tobacco Use: Never    Passive Exposure: Not on Actuary Strain: Not on file  Food Insecurity: No Food Insecurity (03/27/2024)   Epic    Worried About Programme Researcher, Broadcasting/film/video in the Last Year: Never true    Ran Out of Food in the Last Year: Never true  Transportation Needs: No Transportation Needs (03/27/2024)   Epic    Lack of Transportation (Medical): No    Lack of Transportation (Non-Medical): No  Physical Activity: Not on file  Stress: Not on file  Social Connections: Moderately Integrated (03/27/2024)   Social Connection and Isolation Panel    Frequency of Communication with Friends and Family: Twice a week    Frequency of Social Gatherings with Friends and Family: Once a week    Attends  Religious Services: 1 to 4 times per year    Active Member of Clubs or Organizations: No    Attends Banker Meetings: 1 to 4 times per year    Marital Status: Divorced  Depression (EYV7-0): Not on file  Alcohol Screen: Low Risk (03/27/2024)   Alcohol Screen    Last Alcohol Screening Score (AUDIT): 0  Housing: Low Risk (03/27/2024)   Epic    Unable to Pay for Housing in the Last Year: No    Number of Times Moved in the Last Year: 0    Homeless in the Last Year: No  Utilities: Not At Risk (03/27/2024)   Epic    Threatened with loss of utilities: No  Health Literacy: Not on file   Past Medical History:  Past Medical History:  Diagnosis Date   Allergy    Arthritis    back   Prostate cancer (HCC) dx'd 2010   surg only     Past Surgical History:  Procedure Laterality Date   COLONOSCOPY     EYE SURGERY     PROSTATE SURGERY      Current Medications: Current Facility-Administered Medications  Medication Dose Route Frequency Provider Last Rate Last Admin   acetaminophen  (TYLENOL ) tablet 650 mg  650 mg Oral Q6H PRN Coleman, Carolyn H, NP       alum & mag hydroxide-simeth (MAALOX/MYLANTA) 200-200-20 MG/5ML suspension 30 mL  30 mL Oral Q4H PRN Coleman, Carolyn H, NP       docusate sodium  (COLACE) capsule 100 mg  100 mg Oral Daily Jadapalle, Sree, MD   100 mg at 08/21/24 9085   fluPHENAZine  (PROLIXIN ) tablet 10 mg  10 mg Oral BID Demian Maisel, MD   10 mg at 08/21/24 9085   magnesium  hydroxide (MILK OF MAGNESIA) suspension 30 mL  30 mL Oral Daily PRN Mardy Elveria DEL, NP       OLANZapine  (ZYPREXA ) injection 5 mg  5 mg Intramuscular TID PRN Mardy Elveria DEL, NP   5 mg at 04/12/24 2230   OLANZapine  zydis (ZYPREXA ) disintegrating tablet 5 mg  5 mg Oral TID PRN Coleman, Carolyn H, NP   5 mg at 07/22/24 0034   polyethylene glycol (MIRALAX  / GLYCOLAX ) packet 17 g  17 g Oral Daily PRN Donnelly Mellow, MD   17 g at 06/04/24 2123    Lab Results:  No results found for this or any previous visit (from the past 48 hours).      Blood Alcohol level:  Lab Results  Component Value Date   [facility] <15 03/26/2024    Metabolic Disorder Labs: Lab Results  Component Value Date   HGBA1C 5.6 04/02/2024   MPG 114 04/02/2024   No results found for: PROLACTIN Lab Results  Component Value Date   CHOL 146 04/02/2024   TRIG 37 04/02/2024   HDL 43 04/02/2024   CHOLHDL 3.4 04/02/2024   VLDL 7 04/02/2024   LDLCALC 96 04/02/2024    Physical Findings: AIMS:  , ,  ,  ,    CIWA:    COWS:      Psychiatric Specialty Exam:  Presentation  General Appearance:  Appropriate for Environment; Bizarre  Eye Contact: Fair  Speech: Normal rate  Speech Volume: Normal    Mood and Affect   Mood: Fine Affect: Congruent   Thought Process  Thought Processes: Disorganized At baseline Descriptions of Associations:Intact  Orientation:Full (Time, Place and Person)  Thought Content: Impoverished Hallucinations: Denies  Ideas of Reference: denies Suicidal  Thoughts: Denies  Homicidal Thoughts: Denies   Sensorium  Memory: impiared  Judgment: Impaired  Insight: Community Education Officer  Concentration: Fair  Attention Span: Fair  Recall: [employer] of Knowledge: Fair  Language: Fair   Psychomotor Activity  Psychomotor Activity: No data recorded  Musculoskeletal: Strength & Muscle Tone: within normal limits Gait & Station: normal Assets  Assets: Manufacturing Systems Engineer; Resilience    Physical Exam: Physical Exam Vitals and nursing note reviewed.    ROS Blood pressure 114/72, pulse 63, temperature (!) 97.2 F (36.2 C), resp. rate 16, height 6' (1.829 m), weight 100 kg, SpO2 99%. Body mass index is 29.91 kg/m.  Diagnosis: Principal Problem:   Delusional disorder (HCC) Active Problems:   Laceration of left eyebrow   Major neurocognitive disorder [facility])  Major Neurocognitive disorder  Clinical Decision Making: Patient currently admitted after jumping off a two-story building in the context of possible delusions as reported as the people in the house he lived for 4 years trying to kill him.  Patient needs to be monitored closely for ongoing psychosis and paranoid delusions.  Given patient's confusion and chronic paranoia, no family support, APS has taken up guardianship and looking for placement     Safety and Monitoring:             -- InVoluntary admission to inpatient psychiatric unit for safety, stabilization and treatment             -- Daily contact with patient to assess and evaluate symptoms and progress in treatment             -- Patient's case to be discussed in multi-disciplinary team meeting             --  Observation Level: q15 minute checks             -- Vital signs:  q12 hours             -- Precautions: suicide, elopement, and assault   2. Psychiatric Diagnoses and Treatment:              Continue Prolixin  to 10 mg twice daily.  -- The risks/benefits/side-effects/alternatives to this medication were discussed in detail with the patient and time was given for questions. The patient consents to medication trial.                -- Metabolic profile and EKG monitoring obtained while on an atypical antipsychotic (BMI: Lipid Panel: HbgA1c: QTc:)              -- Encouraged patient to participate in unit milieu and in scheduled group therapies                            3. Medical Issues Being Addressed:    Fall on/8/25, CT head normal  4. Discharge Planning:   -- Social work and case management to assist with discharge planning and identification of hospital follow-up needs prior to discharge  -- Estimated LOS: 3-4 days  Desmond Chimera, MD 08/21/2024, 12:38 PM

## 2024-08-21 NOTE — Group Note (Signed)
 LCSW Group Therapy Note   Group Date: 08/21/2024 Start Time: 1330 End Time: 1400   Type of Therapy and Topic:  Group Therapy: Challenging Core Beliefs  Participation Level:  Did Not Attend  Description of Group:  Patients were educated about core beliefs and asked to identify one harmful core belief that they have. Patients were asked to explore from where those beliefs originate. Patients were asked to discuss how those beliefs make them feel and the resulting behaviors of those beliefs. They were then be asked if those beliefs are true and, if so, what evidence they have to support them. Lastly, group members were challenged to replace those negative core beliefs with helpful beliefs.   Therapeutic Goals:   1. Patient will identify harmful core beliefs and explore the origins of such beliefs. 2. Patient will identify feelings and behaviors that result from those core beliefs. 3. Patient will discuss whether such beliefs are true. 4.  Patient will replace harmful core beliefs with helpful ones.  Summary of Patient Progress:  X  Therapeutic Modalities: Cognitive Behavioral Therapy; Solution-Focused Therapy   Janel Beane D Jayon Matton, [place] 08/21/2024  2:00 PM

## 2024-08-21 NOTE — Group Note (Signed)
 Date:  08/21/2024 Time:  10:47 AM  Group Topic/Focus:   Effective coping skills for seniors involve a mix of mental, physical, and social activities, like mindfulness, light exercise (walking, yoga), creative outlets (art, music, writing), maintaining routines, and fostering social connections, which all reduce stress, combat loneliness, boost mood, and build resilience for life's changes. Deep breathing and journaling also offer immediate calm, while therapy can provide professional support.   Participation Level:  Did Not Attend   [name] Gavel 08/21/2024, 10:47 AM

## 2024-08-21 NOTE — Group Note (Signed)
 Date:  08/21/2024 Time:  8:37 PM  Group Topic/Focus:  Wrap-Up Group:   The focus of this group is to help patients review their daily goal of treatment and discuss progress on daily workbooks.    Participation Level:  Active  Participation Quality:  Appropriate  Affect:  Appropriate  Cognitive:  Appropriate  Insight: Appropriate  Engagement in Group:  Engaged  Modes of Intervention:  Discussion  Additional Comments:    Benjamin [name] 08/21/2024, 8:37 PM

## 2024-08-21 NOTE — Progress Notes (Signed)
 Patient was calm and cooperative Sleep Hours - 6 Patient denied anxiety, SI/HI/AVH and depression.    08/20/24 2200  Psych Admission Type (Psych Patients Only)  Admission Status Involuntary  Psychosocial Assessment  Patient Complaints None  Eye Contact Brief  Facial Expression Flat  Affect Appropriate to circumstance  Speech Soft  Interaction Minimal  Motor Activity Slow  Appearance/Hygiene Unremarkable  Behavior Characteristics Cooperative;Appropriate to situation  Mood Pleasant  Thought Process  Coherency WDL  Content WDL  Delusions None reported or observed  Perception WDL  Hallucination None reported or observed  Judgment Impaired  Confusion Mild  Danger to Self  Current suicidal ideation? Denies  Agreement Not to Harm Self Yes  Description of Agreement verbal  Danger to Others  Danger to Others None reported or observed

## 2024-08-22 NOTE — Progress Notes (Signed)
 Behavior:  Pleasant and cooperative.  Confused.   Psych assessment:  Denies SI/HI and AVH  Interaction / Group attendance:  Present in the milieu for meals and some groups. Minimal interaction with peers and staff.   Medication/ PRNs:  Compliant.    Pain:  Denies.  15 min checks in place for safety.

## 2024-08-22 NOTE — Progress Notes (Signed)
" °   08/21/24 2100  Psych Admission Type (Psych Patients Only)  Admission Status Involuntary  Psychosocial Assessment  Patient Complaints Confusion  Eye Contact Brief  Facial Expression Flat  Affect Appropriate to circumstance  Speech Soft  Interaction Minimal  Motor Activity Slow  Appearance/Hygiene Unremarkable  Behavior Characteristics Cooperative  Mood Pleasant  Thought Process  Coherency WDL  Content WDL  Delusions None reported or observed  Perception WDL  Hallucination None reported or observed  Judgment Impaired  Confusion Mild  Danger to Self  Current suicidal ideation? Denies    "

## 2024-08-22 NOTE — Plan of Care (Signed)
°  Problem: Activity: Goal: Interest or engagement in activities will improve Outcome: Progressing   Problem: Coping: Goal: Ability to demonstrate self-control will improve Outcome: Progressing   Problem: Education: Goal: Verbalization of understanding the information provided will improve Outcome: Not Progressing

## 2024-08-22 NOTE — Plan of Care (Signed)
  Problem: Activity: Goal: Risk for activity intolerance will decrease Outcome: Progressing   Problem: Nutrition: Goal: Adequate nutrition will be maintained Outcome: Progressing   Problem: Coping: Goal: Level of anxiety will decrease Outcome: Progressing   Problem: Pain Managment: Goal: General experience of comfort will improve and/or be controlled Outcome: Progressing   Problem: Safety: Goal: Ability to remain free from injury will improve Outcome: Progressing

## 2024-08-22 NOTE — Group Note (Signed)
 Date:  08/22/2024 Time:  10:41 AM  Group Topic/Focus:    For geriatric patients, combining gentle, low-impact exercises like walking, chair yoga, or Tai Chi with mindfulness and meditation (breathing, body scans) significantly boosts physical (balance, strength) and mental (less anxiety/depression, better focus) health, improving overall well-being by managing chronic pain and enhancing emotional resilience. Adaptations are key: use chairs for stability, focus on simple guided sessions, and prioritize comfort for balance, mobility, and sensory issues.  Meditation is a simple yet powerful way to support your emotional and physical well-being. Regular practice can help lower blood pressure, ease anxiety, and improve focus and sleep. From deep breathing and body scans to guided imagery and mindfulness, there are many styles to explore.  Participation Level:  None  Participation Quality:  Appropriate  Affect:  Appropriate  Cognitive:  Alert  Insight: None  Engagement in Group:  None  Modes of Intervention:  Activity  Additional Comments:  N/A  Benjamin [name] Gavel 08/22/2024, 10:41 AM

## 2024-08-22 NOTE — Group Note (Signed)
 Date:  08/22/2024 Time:  3:40 PM  Group Topic/Focus:  Coping With Mental Health Crisis:   The purpose of this group is to help patients identify strategies for coping with mental health crisis.  Group discusses possible causes of crisis and ways to manage them effectively.    Participation Level:  Did Not Attend    [name] 08/22/2024, 3:40 PM

## 2024-08-22 NOTE — [facility] Counselor (Signed)
 Pt was visited by [facility], potential placement, as set-up by [name], APS caseworker.   Pt answered assessment questions.   CSW provided current med list and progress note per their request.   CSW was told she should be hearing from them soon regarding if pt was accepted or not.   CSW escorted [facility] staff members Brittany and [place] off unit without incident.   Lum Croft, MSW, [place] 08/22/2024 11:22 AM

## 2024-08-22 NOTE — [facility] Group Notes (Signed)
 Spirituality Group   Description: Participant directed exploration of values, beliefs and meaning   **Group focused on setting intentions for the year ahead: What do we wish to leave behind/what do we claim going forward   Following a brief framework of chaplains role and ground rules of group behavior, participants are invited to share concerns or questions that engage spiritual life. Emphasis placed on common themes and shared experiences and ways to make meaning and clarify living into ones values.   Theory/Process/Goal: Utilize the theoretical framework of group therapy established by Celena Kite, Relational Cultural Theory and Rogerian approaches to facilitate relational empathy and use of the here and now to foster reflection, self-awareness, and sharing.   Observations: Smokey made effort to attend group but became drowsy and not able to participate.  Alamin Mccuiston L. [name] HERO.Div

## 2024-08-23 DIAGNOSIS — F22 Delusional disorders: Secondary | ICD-10-CM | POA: Diagnosis not present

## 2024-08-23 DIAGNOSIS — F0390 Unspecified dementia without behavioral disturbance: Secondary | ICD-10-CM | POA: Diagnosis not present

## 2024-08-23 NOTE — Progress Notes (Signed)
 Slept 11.5 hours

## 2024-08-23 NOTE — Group Note (Signed)
 Date:  08/23/2024 Time:  3:44 PM  Group Topic/Focus:  Goals Group:   The focus of this group is to help patients establish daily goals to achieve during treatment and discuss how the patient can incorporate goal setting into their daily lives to aide in recovery.    Participation Level:  Did Not Attend   Benjamin [name] 08/23/2024, 3:44 PM

## 2024-08-23 NOTE — Group Note (Signed)
 Recreation Therapy Group Note   Group Topic:Goal Setting  Group Date: 08/23/2024 Start Time: 1330 End Time: 1405 Facilitators: Celestia Jeoffrey BRAVO, LRT, CTRS Location: Dayroom  Group Description: New Years Resolutions. Patients received a handout for them to write down their new years resolutions on it. The handout had a decorative border around the paper that patients could also color once they're finished writing their new year resolutions down. Afterwards, LRT encouraged patients to read out loud their new years resolutions to the group.   Goal Area(s) Addressed: Patient will increase communication,  Patient will identify a goal leading into the new year.  Patient will foster mutual support. Patient will express themselves through art.    Affect/Mood: Appropriate and Flat   Participation Level: Minimal    Clinical Observations/Individualized Feedback: Benjamin [name] was present and awake duration of session. Pt shared that he did not have any new years resolutions and that he did not believe in that stuff.   Plan: Continue to engage patient in RT group sessions 2-3x/week.   Jeoffrey BRAVO Celestia, LRT, CTRS 08/23/2024 4:27 PM

## 2024-08-23 NOTE — Group Note (Signed)
 Date:  08/23/2024 Time:  11:06 AM  Group Topic/Focus:  Making Healthy Choices:   The focus of this group is to help patients identify negative/unhealthy choices they were using prior to admission and identify positive/healthier coping strategies to replace them upon discharge.    Participation Level:  Minimal  Participation Quality:  Appropriate and Attentive  Affect:  Appropriate  Cognitive:  Alert  Insight: Appropriate  Engagement in Group:  Limited  Modes of Intervention:  Activity  Additional Comments:     Maglione,Arseniy Toomey E 08/23/2024, 11:06 AM

## 2024-08-23 NOTE — Plan of Care (Signed)
   Problem: Clinical Measurements: Goal: Ability to maintain clinical measurements within normal limits will improve Outcome: Progressing Goal: Will remain free from infection Outcome: Progressing   Problem: Activity: Goal: Risk for activity intolerance will decrease Outcome: Progressing

## 2024-08-23 NOTE — Group Note (Signed)
 [facility] LCSW Group Therapy Note   Group Date: 08/23/2024 Start Time: 1300 End Time: 1330  Type of Therapy and Topic:  Group Therapy:  Feelings around Relapse and Recovery  Participation Level:  None   Description of Group:    Patients in this group will discuss emotions they experience before and after a relapse. They will process how experiencing these feelings, or avoidance of experiencing them, relates to having a relapse. Facilitator will guide patients to explore emotions they have related to recovery. Patients will be encouraged to process which emotions are more powerful. They will be guided to discuss the emotional reaction significant others in their lives may have to patients relapse or recovery. Patients will be assisted in exploring ways to respond to the emotions of others without this contributing to a relapse.  Therapeutic Goals: Patient will identify two or more emotions that lead to relapse for them:  Patient will identify two emotions that result when they relapse:  Patient will identify two emotions related to recovery:  Patient will demonstrate ability to communicate their needs through discussion and/or role plays.   Summary of Patient Progress: Patient was present for the entirety of group. However, he did not participate in the activity or the discussion.   Therapeutic Modalities:   Cognitive Behavioral Therapy Solution-Focused Therapy Assertiveness Training Relapse Prevention Therapy   Nadara JONELLE Fam, LCSW

## 2024-08-23 NOTE — Progress Notes (Signed)
" °   08/23/24 2200  Psych Admission Type (Psych Patients Only)  Admission Status Involuntary  Psychosocial Assessment  Patient Complaints Confusion  Eye Contact Brief  Facial Expression Flat  Affect Appropriate to circumstance  Speech Soft  Interaction Minimal  Motor Activity Slow  Appearance/Hygiene In scrubs  Behavior Characteristics Cooperative  Mood Pleasant  Thought Process  Coherency WDL  Content WDL  Delusions None reported or observed  Perception WDL  Hallucination None reported or observed  Judgment Impaired  Confusion Mild  Danger to Self  Current suicidal ideation? Denies  Self-Injurious Behavior No self-injurious ideation or behavior indicators observed or expressed   Agreement Not to Harm Self Yes  Description of Agreement Verbal  Danger to Others  Danger to Others None reported or observed    "

## 2024-08-23 NOTE — Progress Notes (Signed)
" °   08/22/24 2100  Psych Admission Type (Psych Patients Only)  Admission Status Involuntary  Psychosocial Assessment  Patient Complaints Confusion  Eye Contact Brief  Facial Expression Flat  Affect Appropriate to circumstance  Speech Soft  Interaction Minimal  Motor Activity Slow  Appearance/Hygiene In scrubs  Behavior Characteristics Cooperative  Mood Pleasant  Thought Process  Coherency WDL  Content WDL  Delusions None reported or observed  Perception WDL  Hallucination None reported or observed  Judgment Impaired  Confusion Mild  Danger to Self  Current suicidal ideation? Denies    "

## 2024-08-23 NOTE — Plan of Care (Signed)
  Problem: Activity: Goal: Interest or engagement in activities will improve Outcome: Progressing Goal: Sleeping patterns will improve Outcome: Progressing   Problem: Coping: Goal: Ability to verbalize frustrations and anger appropriately will improve Outcome: Progressing Goal: Ability to demonstrate self-control will improve Outcome: Progressing   Problem: Safety: Goal: Periods of time without injury will increase Outcome: Progressing

## 2024-08-23 NOTE — Progress Notes (Signed)
 Mobility Specialist Progress Note:    08/23/24 1421  Mobility  Activity Ambulated with assistance  Level of Assistance Contact guard assist, steadying assist  Assistive Device Front wheel walker  Distance Ambulated (ft) 350 ft  Range of Motion/Exercises Active;All extremities  Activity Response Tolerated well  Mobility visit 1 Mobility  Mobility Specialist Start Time (ACUTE ONLY) 1409  Mobility Specialist Stop Time (ACUTE ONLY) 1421  Mobility Specialist Time Calculation (min) (ACUTE ONLY) 12 min   Pt received in day room, pleasant and eager for mobility. Required CGA to ambulate with RW. Tolerated well, requires verbal cues for correct positioning with RW. Left pt in room, all needs met.  Sherrilee Ditty Mobility Specialist Please contact via Special Educational Needs Teacher or  Rehab office at [phone]

## 2024-08-23 NOTE — Group Note (Signed)
 Date:  08/23/2024 Time:  8:33 PM  Group Topic/Focus:  Wrap-Up Group:   The focus of this group is to help patients review their daily goal of treatment and discuss progress on daily workbooks.    Participation Level:  Active  Participation Quality:  Appropriate  Affect:  Appropriate  Cognitive:  Appropriate  Insight: Appropriate  Engagement in Group:  Engaged  Modes of Intervention:  Discussion  Additional Comments:    Benjamin [name] 08/23/2024, 8:33 PM

## 2024-08-23 NOTE — Progress Notes (Addendum)
 D- Patient alert and oriented. Pt presents with a pleasant mood and affect. Pt states he slept well. Denies SI, HI, AVH, and pain. Pt does not have a goal at this time.  A- Scheduled medications administered to patient, per MD orders. Support and encouragement provided.  Routine safety checks conducted every 15 minutes.  Patient informed to notify staff with problems or concerns.  R- No adverse drug reactions noted. Patient contracts for safety at this time. Patient compliant with medications and treatment plan. Patient receptive, calm, and cooperative. Patient interacts well with others on the unit.  Patient remains safe at this time.    Nithin Demeo S.,RN

## 2024-08-24 NOTE — Progress Notes (Signed)
(  Sleep Hours) - 10.75  (Any PRNs that were needed, meds refused, or side effects to meds)- n/a  (Any disturbances and when (visitation, over night)- n/a  (Concerns raised by the patient)- n/a  (SI/HI/AVH)- denies

## 2024-08-24 NOTE — Group Note (Signed)
 Date:  08/24/2024 Time:  3:40 PM  Group Topic/Focus:  Coping With Mental Health Crisis:   The purpose of this group is to help patients identify strategies for coping with mental health crisis.  Group discusses possible causes of crisis and ways to manage them effectively.    Participation Level:  Did Not Attend    [name] 08/24/2024, 3:40 PM

## 2024-08-24 NOTE — Group Note (Signed)
 Date:  08/24/2024 Time:  12:15 PM  Group Topic/Focus:  Managing Feelings:   The focus of this group is to identify what feelings patients have difficulty handling and develop a plan to handle them in a healthier way upon discharge.    Participation Level:  Did Not Attend   Benjamin [name] 08/24/2024, 12:15 PM

## 2024-08-24 NOTE — Progress Notes (Signed)
 [facility] MD Progress Note  08/24/2024 9:24 AM Benjamin [name]  MRN:  [id number] Benjamin [name] is a 79 year old male who presents to the inpatient geriatric psych unit after jumping from a two story building. Patient was originally seen at [facility] who referred him to the ED who admitted him here on 03/27/2024.    Subjective: Chart is reviewed and discussed with the treatment team.  APS is working on legal guardianship and placement.   At times he is more confused and needs redirection's denies any suicidal Seidel thoughts he is waiting for placement   Psychiatric History: see h&P  Family History:  Family History  Problem Relation Age of Onset   Colon cancer Neg Hx    Colon polyps Neg Hx    Stomach cancer Neg Hx    Esophageal cancer Neg Hx    Social History:  Social History   Substance and Sexual Activity  Alcohol Use No     Social History   Substance and Sexual Activity  Drug Use No    Social History   Socioeconomic History   Marital status: Single    Spouse name: Not on file   Number of children: Not on file   Years of education: Not on file   Highest education level: Not on file  Occupational History   Not on file  Tobacco Use   Smoking status: Former    Types: Cigarettes   Smokeless tobacco: Never  Vaping Use   Vaping status: Never Used  Substance and Sexual Activity   Alcohol use: No   Drug use: No   Sexual activity: Not on file  Other Topics Concern   Not on file  Social History Narrative   Not on file   Social Drivers of Health   Tobacco Use: Medium Risk (03/27/2024)   Patient History    Smoking Tobacco Use: Former    Smokeless Tobacco Use: Never    Passive Exposure: Not on Actuary Strain: Not on file  Food Insecurity: No Food Insecurity (03/27/2024)   Epic    Worried About Programme Researcher, Broadcasting/film/video in the Last Year: Never true    Ran Out of Food in the Last Year: Never true  Transportation Needs: No Transportation Needs  (03/27/2024)   Epic    Lack of Transportation (Medical): No    Lack of Transportation (Non-Medical): No  Physical Activity: Not on file  Stress: Not on file  Social Connections: Moderately Integrated (03/27/2024)   Social Connection and Isolation Panel    Frequency of Communication with Friends and Family: Twice a week    Frequency of Social Gatherings with Friends and Family: Once a week    Attends Religious Services: 1 to 4 times per year    Active Member of [employer] or Organizations: No    Attends Banker Meetings: 1 to 4 times per year    Marital Status: Divorced  Depression (PHQ2-9): Not on file  Alcohol Screen: Low Risk (03/27/2024)   Alcohol Screen    Last Alcohol Screening Score (AUDIT): 0  Housing: Low Risk (03/27/2024)   Epic    Unable to Pay for Housing in the Last Year: No    Number of Times Moved in the Last Year: 0    Homeless in the Last Year: No  Utilities: Not At Risk (03/27/2024)   Epic    Threatened with loss of utilities: No  Health Literacy: Not on file   Past Medical History:  Past Medical History:  Diagnosis Date   Allergy    Arthritis    back   Prostate cancer (HCC) dx'd 2010   surg only    Past Surgical History:  Procedure Laterality Date   COLONOSCOPY     EYE SURGERY     PROSTATE SURGERY      Current Medications: Current Facility-Administered Medications  Medication Dose Route Frequency Provider Last Rate Last Admin   acetaminophen  (TYLENOL ) tablet 650 mg  650 mg Oral Q6H PRN Coleman, Carolyn H, NP       alum & mag hydroxide-simeth (MAALOX/MYLANTA) 200-200-20 MG/5ML suspension 30 mL  30 mL Oral Q4H PRN Coleman, Carolyn H, NP       docusate sodium  (COLACE) capsule 100 mg  100 mg Oral Daily Jadapalle, Sree, MD   100 mg at 08/24/24 0800   fluPHENAZine  (PROLIXIN ) tablet 10 mg  10 mg Oral BID Shrivastava, Aryendra, MD   10 mg at 08/24/24 0800   magnesium  hydroxide (MILK OF MAGNESIA) suspension 30 mL  30 mL Oral Daily PRN Coleman, Carolyn H, NP        OLANZapine  (ZYPREXA ) injection 5 mg  5 mg Intramuscular TID PRN Mardy Elveria DEL, NP   5 mg at 04/12/24 2230   OLANZapine  zydis (ZYPREXA ) disintegrating tablet 5 mg  5 mg Oral TID PRN Coleman, Carolyn H, NP   5 mg at 07/22/24 0034   polyethylene glycol (MIRALAX  / GLYCOLAX ) packet 17 g  17 g Oral Daily PRN Donnelly Mellow, MD   17 g at 06/04/24 2123    Lab Results:  No results found for this or any previous visit (from the past 48 hours).      Blood Alcohol level:  Lab Results  Component Value Date   [facility] <15 03/26/2024    Metabolic Disorder Labs: Lab Results  Component Value Date   HGBA1C 5.6 04/02/2024   MPG 114 04/02/2024   No results found for: PROLACTIN Lab Results  Component Value Date   CHOL 146 04/02/2024   TRIG 37 04/02/2024   HDL 43 04/02/2024   CHOLHDL 3.4 04/02/2024   VLDL 7 04/02/2024   LDLCALC 96 04/02/2024    Physical Findings: AIMS:  , ,  ,  ,    CIWA:    COWS:      Psychiatric Specialty Exam:  Presentation  General Appearance:  Appropriate for Environment; Bizarre  Eye Contact: Fair  Speech: Normal rate  Speech Volume: Normal    Mood and Affect  Mood: Fine Affect: Congruent   Thought Process  Thought Processes: Disorganized At baseline Descriptions of Associations:Intact  Orientation:Full (Time, Place and Person)  Thought Content: Impoverished Hallucinations: Denies  Ideas of Reference: denies Suicidal Thoughts: Denies  Homicidal Thoughts: Denies   Sensorium  Memory: impiared  Judgment: Impaired  Insight: Community Education Officer  Concentration: Fair  Attention Span: Fair  Recall: [employer] of Knowledge: Fair  Language: Fair   Psychomotor Activity  Psychomotor Activity: No data recorded  Musculoskeletal: Strength & Muscle Tone: within normal limits Gait & Station: normal Assets  Assets: Manufacturing Systems Engineer; Resilience    Physical Exam: Physical Exam Vitals and  nursing note reviewed.    ROS Blood pressure 137/80, pulse 71, temperature 98.4 F (36.9 C), resp. rate (!) 22, height 6' (1.829 m), weight 100 kg, SpO2 99%. Body mass index is 29.91 kg/m.  Diagnosis: Principal Problem:   Delusional disorder (HCC) Active Problems:   Laceration of left eyebrow   Major neurocognitive  disorder [facility])  Major Neurocognitive disorder  Clinical Decision Making: Patient currently admitted after jumping off a two-story building in the context of possible delusions as reported as the people in the house he lived for 4 years trying to kill him.  Patient needs to be monitored closely for ongoing psychosis and paranoid delusions.  Given patient's confusion and chronic paranoia, no family support, APS has taken up guardianship and looking for placement     Safety and Monitoring:             -- InVoluntary admission to inpatient psychiatric unit for safety, stabilization and treatment             -- Daily contact with patient to assess and evaluate symptoms and progress in treatment             -- Patient's case to be discussed in multi-disciplinary team meeting             -- Observation Level: q15 minute checks             -- Vital signs:  q12 hours             -- Precautions: suicide, elopement, and assault   2. Psychiatric Diagnoses and Treatment:              Continue Prolixin  to 10 mg twice daily.  -- The risks/benefits/side-effects/alternatives to this medication were discussed in detail with the patient and time was given for questions. The patient consents to medication trial.                -- Metabolic profile and EKG monitoring obtained while on an atypical antipsychotic (BMI: Lipid Panel: HbgA1c: QTc:)              -- Encouraged patient to participate in unit milieu and in scheduled group therapies                            3. Medical Issues Being Addressed:    Fall on/8/25, CT head normal  4. Discharge Planning:   -- Social work and case  management to assist with discharge planning and identification of hospital follow-up needs prior to discharge  -- Estimated LOS: 3-4 days  Millie JONELLE Manners, MD 08/24/2024, 9:24 AM

## 2024-08-24 NOTE — Group Note (Signed)
 Physical/Occupational Therapy Group Note  Group Topic: Neurographic Art  Group Date: 08/24/2024 Start Time: 1305 End Time: 1355 Facilitators: Clive Warren CROME, OT   Group Description: Group participated with Neurographic art activity, using watercolor paints to facilitate creative expression and meditation/relaxation for each individual.  Incorporated bimanual coordination, mental focus, emotional processing, task/command following and relaxation techniques as appropriate.  Patients engaged socially with therapist and other group participants throughout session. Allowed to ask questions as appropriate, and encouraged to identify ways they could use/share their creations with themselves and others.  Therapeutic Goal(s):  Demonstrate ability to independently manipulate utensils required to participate with and complete activity. Demonstrate ability to cognitively focus on task and follow commands necessary for completion. Demonstrate use of art as an outlet for emotional processing and expression. Identify and demonstrate importance of relaxation, neural calming and meditation for improved participation with life groups.  Individual Participation: Pt pleasant, present at start of session and agreeable to sitting at the group table for the activity. Pt demo'd some difficulty with following specific instructions and required intermittent VC to engage/continue the activity. Would engage in group discussion with prompting.   Participation Level: Moderate   Participation Quality: Moderate Cues   Behavior: Calm and Cooperative   Speech/Thought Process: Barely audible and Relevant   Affect/Mood: Flat   Insight: Impaired   Judgement: Impaired   Modes of Intervention: Activity, Clarification, Discussion, Education, Exploration, Problem-solving, Rapport Building, Socialization, and Support  Patient Response to Interventions:  Disengaged, Engaged, and Interested    Plan: Continue to engage patient  in PT/OT groups 1 - 2x/week.  Lannah Koike R., MPH, MS, OTR/L ascom [phone] 08/24/2024, 3:42 PM

## 2024-08-24 NOTE — Group Note (Signed)
 Recreation Therapy Group Note   Group Topic:Emotion Expression  Group Date: 08/24/2024 Start Time: 1400 End Time: 1430 Facilitators: Celestia Jeoffrey BRAVO, LRT, CTRS Location: Courtyard  Group Description: Music. Patients are encouraged to name their favorite song(s) for LRT to play song through speaker for group to hear, while in the courtyard getting fresh air and sunlight. Patients educated on the definition of leisure and the importance of having different leisure interests outside of the hospital. Group discussed how leisure activities can often be used as pharmacologist and that listening to music and being outside are examples.    Goal Area(s) Addressed:  Patient will identify a current leisure interest.  Patient will practice making a positive decision. Patient will have the opportunity to try a new leisure activity.   Affect/Mood: N/A   Participation Level: Did not attend    Clinical Observations/Individualized Feedback: Patient did not attend.  Plan: Continue to engage patient in RT group sessions 2-3x/week.   Jeoffrey BRAVO Celestia, LRT, CTRS 08/24/2024 4:56 PM

## 2024-08-25 NOTE — Plan of Care (Signed)
 Patient alert and oriented. Denies SI, HI, AVH and pain. Scheduled medications per MAR. Support and encouragement provided.  Routine safety checks conducted every 15 minutes.  Patient informed to notify staff with problems or concerns. No adverse drug reactions noted. Patient verbally contracts for safety at this time. Patient interacts well with others on the unit.  Patient remains safe at this time.  Problem: Health Behavior/Discharge Planning: Goal: Ability to manage health-related needs will improve Outcome: Progressing   Problem: Activity: Goal: Risk for activity intolerance will decrease Outcome: Progressing   Problem: Pain Managment: Goal: General experience of comfort will improve and/or be controlled Outcome: Progressing

## 2024-08-25 NOTE — Progress Notes (Signed)
" °   08/25/24 1300  Psych Admission Type (Psych Patients Only)  Admission Status Involuntary  Psychosocial Assessment  Patient Complaints None  Eye Contact Brief  Facial Expression Flat  Affect Appropriate to circumstance  Speech Soft  Interaction Minimal  Motor Activity Slow  Appearance/Hygiene In scrubs  Behavior Characteristics Cooperative  Mood Pleasant  Thought Process  Coherency WDL  Content WDL  Delusions None reported or observed  Perception WDL  Hallucination None reported or observed  Judgment Impaired  Confusion Moderate  Danger to Self  Current suicidal ideation? Denies  Danger to Others  Danger to Others None reported or observed    "

## 2024-08-25 NOTE — Progress Notes (Signed)
 SI/HI/AVH: denies all  Behavior/Mood: cooperative/pleasant    Interaction/Group attendance: minimal/ 0 of 3 groups   Medication/PRNs: compliant/none    Pain: denies  Other: discharge planning ongoing

## 2024-08-25 NOTE — BH IP Treatment Plan (Signed)
 Interdisciplinary Treatment and Diagnostic Plan Update  08/25/2024 Time of Session: 1030am Benjamin [name] MRN: [id number]  Principal Diagnosis: Delusional disorder [facility])  Secondary Diagnoses: Principal Problem:   Delusional disorder (HCC) Active Problems:   Laceration of left eyebrow   Major neurocognitive disorder (HCC)   Current Medications:  Current Facility-Administered Medications  Medication Dose Route Frequency Provider Last Rate Last Admin   acetaminophen  (TYLENOL ) tablet 650 mg  650 mg Oral Q6H PRN Coleman, Carolyn H, NP       alum & mag hydroxide-simeth (MAALOX/MYLANTA) 200-200-20 MG/5ML suspension 30 mL  30 mL Oral Q4H PRN Coleman, Carolyn H, NP       docusate sodium  (COLACE) capsule 100 mg  100 mg Oral Daily Jadapalle, Sree, MD   100 mg at 08/25/24 9193   fluPHENAZine  (PROLIXIN ) tablet 10 mg  10 mg Oral BID Shrivastava, Aryendra, MD   10 mg at 08/25/24 9193   magnesium  hydroxide (MILK OF MAGNESIA) suspension 30 mL  30 mL Oral Daily PRN Coleman, Carolyn H, NP       OLANZapine  (ZYPREXA ) injection 5 mg  5 mg Intramuscular TID PRN Mardy Elveria DEL, NP   5 mg at 04/12/24 2230   OLANZapine  zydis (ZYPREXA ) disintegrating tablet 5 mg  5 mg Oral TID PRN Coleman, Carolyn H, NP   5 mg at 07/22/24 0034   polyethylene glycol (MIRALAX  / GLYCOLAX ) packet 17 g  17 g Oral Daily PRN Donnelly Mellow, MD   17 g at 06/04/24 2123   PTA Medications: Medications Prior to Admission  Medication Sig Dispense Refill Last Dose/Taking   docusate sodium  (COLACE) 100 MG capsule Take 100 mg by mouth daily.      omega-3 acid ethyl esters (LOVAZA) 1 g capsule Take 1 g by mouth daily.       Patient Stressors: Traumatic event    Patient Strengths: Communication skills   Treatment Modalities: Medication Management, Group therapy, Case management,  1 to 1 session with clinician, Psychoeducation, Recreational therapy.   Physician Treatment Plan for Primary Diagnosis: Delusional disorder [name]) Long  Term Goal(s): Improvement in symptoms so as ready for discharge   Short Term Goals: Ability to identify changes in lifestyle to reduce recurrence of condition will improve Ability to verbalize feelings will improve Ability to disclose and discuss suicidal ideas Ability to demonstrate self-control will improve Ability to identify and develop effective coping behaviors will improve  Medication Management: Evaluate patient's response, side effects, and tolerance of medication regimen.  Therapeutic Interventions: 1 to 1 sessions, Unit Group sessions and Medication administration.  Evaluation of Outcomes: Progressing  Physician Treatment Plan for Secondary Diagnosis: Principal Problem:   Delusional disorder (HCC) Active Problems:   Laceration of left eyebrow   Major neurocognitive disorder (HCC)  Long Term Goal(s): Improvement in symptoms so as ready for discharge   Short Term Goals: Ability to identify changes in lifestyle to reduce recurrence of condition will improve Ability to verbalize feelings will improve Ability to disclose and discuss suicidal ideas Ability to demonstrate self-control will improve Ability to identify and develop effective coping behaviors will improve     Medication Management: Evaluate patient's response, side effects, and tolerance of medication regimen.  Therapeutic Interventions: 1 to 1 sessions, Unit Group sessions and Medication administration.  Evaluation of Outcomes: Progressing   RN Treatment Plan for Primary Diagnosis: Delusional disorder (HCC) Long Term Goal(s): Knowledge of disease and therapeutic regimen to maintain health will improve  Short Term Goals: Ability to remain free from injury will improve,  Ability to verbalize frustration and anger appropriately will improve, Ability to demonstrate self-control, Ability to participate in decision making will improve, Ability to verbalize feelings will improve, Ability to disclose and discuss  suicidal ideas, Ability to identify and develop effective coping behaviors will improve, and Compliance with prescribed medications will improve  Medication Management: RN will administer medications as ordered by provider, will assess and evaluate patient's response and provide education to patient for prescribed medication. RN will report any adverse and/or side effects to prescribing provider.  Therapeutic Interventions: 1 on 1 counseling sessions, Psychoeducation, Medication administration, Evaluate responses to treatment, Monitor vital signs and CBGs as ordered, Perform/monitor CIWA, COWS, AIMS and Fall Risk screenings as ordered, Perform wound care treatments as ordered.  Evaluation of Outcomes: Progressing   LCSW Treatment Plan for Primary Diagnosis: Delusional disorder [facility]) Long Term Goal(s): Safe transition to appropriate next level of care at discharge, Engage patient in therapeutic group addressing interpersonal concerns.  Short Term Goals: Engage patient in aftercare planning with referrals and resources, Increase social support, Increase ability to appropriately verbalize feelings, Increase emotional regulation, Facilitate acceptance of mental health diagnosis and concerns, Facilitate patient progression through stages of change regarding substance use diagnoses and concerns, Identify triggers associated with mental health/substance abuse issues, and Increase skills for wellness and recovery  Therapeutic Interventions: Assess for all discharge needs, 1 to 1 time with Social worker, Explore available resources and support systems, Assess for adequacy in community support network, Educate family and significant other(s) on suicide prevention, Complete Psychosocial Assessment, Interpersonal group therapy.  Evaluation of Outcomes: Progressing   Progress in Treatment: Attending group NO Participating in groups: No. Taking medication as prescribed: Yes. Toleration medication:  Yes. Family/Significant other contact made: Yes, individual(s) contacted:   Benjamin [name], brother, [phone]  Patient understands diagnosis: No. Discussing patient identified problems/goals with staff: Yes. Medical problems stabilized or resolved: Yes. Denies suicidal/homicidal ideation: Yes. Issues/concerns per patient self-inventory: No. Other: none   New problem(s) identified:  No, Describe:  05/14/24 Update: None  05/24/24 Update: No changes at this time.  Update 05/29/2024:  No changes at this time.  Update 06/03/2024:  No changes at this time. Update 06/08/24: No changes at this time Update 06/13/24: No changes at this time  Update 06/18/24: No changes at this time Update 06/23/24: No changes at this time  Update 06/28/24: No changes at this time Update 07/03/24: No changes at this time Update 07/09/24: No changes at this time Update 07/14/24: No changes at this time  Update 07/19/24: No changes at this time Update 07/25/24: No changes at this time Update 07/30/24: No changes at this time Update:08/04/24: No changes at this time. 08/09/24 Update: No changes at this time. Update 08/14/24: No changes at this time Update 08/20/24: No changes at this time  08/25/2024 no changes New Short Term/Long Term Goal(s): elimination of symptoms of psychosis, medication management for mood stabilization; elimination of SI thoughts; development of comprehensive mental wellness plan. Update 04/02/24: No changes at this time. Update 04/07/24: No changes at this time. Update 04/12/24: No changes at this time Update 04/18/24: No changes at this time  Update 04/23/24: No changes at this time. Update 04/28/2024: No changes at this time.  Update 05/04/2024: No changes at this time.  Update 05/09/2024:  No changes at this time. 05/14/24 Update: No changes at this time. 05/19/24 Update: No changes at this time. 05/24/24 Update: No changes at this time. Update 05/29/2024:  No changes at this time. Update 06/03/2024:  No changes at  this time. Update 06/08/24: No changes at this time Update 06/13/24: No changes at this time Update 06/18/24: No changes at this time Update 06/23/24: No changes at this time Update 06/28/24: No changes at this time  Update 07/03/24: No changes at this time Update 07/09/24: No changes at this time Update 07/14/24: No changes at this time Update 07/19/24: No changes at this time Update 07/25/24: No changes at this time Update 07/30/24: No changes at this time Update 08/04/24: No changes at this time. 08/09/24 Update: No changes at this time. Update 08/14/24: No changes at this time  Update 08/20/24: No changes at this time  08/25/24 no changes at this time  Patient Goals:   Get out of here, I want to get healed up so I can get out of here Update 04/02/24: No changes at this time. Update 04/07/24: No changes at this time .Update 04/12/24: No changes at this time Update 04/18/24: No changes at this time  Update 04/23/24: No changes at this time. Update 04/28/2024: No changes at this time. Update 05/04/2024: No changes at this time.   Update 05/29/2024:  No changes at this time.  Update 06/03/2024:  No changes at this time. Update 06/08/24: No changes at this time Update 06/13/24: No changes at this time Update 06/18/24: No changes at this time Update 06/23/24: No changes at this time Update 06/28/24: No changes at this time  Update 07/03/24: No changes at this time Update 07/09/24: No changes at this time  Update 07/14/24: No changes at this time Update 07/19/24: No changes at this time Update 07/25/24: No changes at this time Update 07/30/24: No changes at this time Update 08/04/24 No changes at this time. 08/09/24 Update: No changes at this time. Update 08/14/24: No changes at this time Update 08/20/24: No changes at this time  Update 08/25/2024 no changes  Discharge Plan or Barriers:  CSW will assist with appropriate discharge planning  Update 04/02/24: No changes at this time. Update 04/07/24: No changes at this time.Update  04/12/24: No changes at this time Update 04/18/24: CSW submitted report to APS. Care team looking at guardianship for pt at this time  Update 04/23/24: No changes at this time. Update 04/28/2024: No changes at this time. Update 05/04/2024: No changes at this time.  Update 05/09/2024:  [name] APS continues to search for placement. 05/14/24 Update: [name] APS continues to look for placement. CSW to continue to assess. 05/19/24 Update: No changes at this time. 05/24/24 Update:DSS continues to look for placement at this time Update 05/29/2024:  Patient remains on the unit and safe at this time.  Case was accepted by APS [name] and they are looking for placement for the patient.   Update 06/03/2024:  No changes at this time. Update 06/08/24: No changes at this time Update 06/13/24: [facility] APS having difficulty connecting with pt's care coordinator at [facility] to assist with placement Update 06/18/24: No changes at this time Update 06/23/24: [facility] reports that she may have found placement for pt. CSW will send TB tests per her request Update 06/28/24: [facility] reports pt currently awaiting approval of 1915[facility]. Reports she should know more about placement next week.  Update 07/03/24: No changes at this time Update 07/09/24: Pt still pending placement according to APS. Pt's 1915[facility] is also still pending according to APS. Update 07/14/24: No changes at this time Update 07/19/24: No changes at this time Update 07/25/24: No changes at this time  Update 07/30/24: According to APS, 1915[facility] has been approved and they will move forward with placement. APS also reports pt's CCA is pending through [employer] and that should be completed today as well should current placement option fall through. 08/04/24 No changes at this time. 08/09/24 Update: Updated FL2 sent to [name] with DSS for skilled nursing and assisted living centers following changes in patient's disposition. Update 08/14/24: No changes at  this time  Update 08/20/24: No changes at this time  Update 08/25/24 no changes at this time    Reason for Continuation of Hospitalization: Delusions  Medication stabilization  Estimated Length of Stay:1 to 7 days Update 04/02/24: TBD. Update 04/07/24: TBD Update 04/12/24:TBD. Update 04/28/24:TBD Update 05/04/2024: TBD Update 05/09/2024:  TBD  05/14/24 Update: TBD. 05/19/24 Update: TBD. 05/24/24 Update: TBD  Update 05/29/2024:  TBD.  Update 06/03/2024:  TBD Update 06/08/24: TBD Update 06/13/24: TBD Update 06/18/24: TBD Update 06/23/24: TBD Update 06/28/24: TBD Update 07/03/24: TBD Update 07/09/24: TBD  Update 07/14/24: TBD Update 07/19/24: TBD Update 07/25/24: TBD Update 07/30/24: TBD  08/04/24: TBD 08/09/24 Update: TBD. Update 08/14/24: TBD Update 08/20/24: TBD Update TBD 08/25/24  Last 3 [place] Suicide Severity Risk Score: Flowsheet Row Admission (Current) from 03/27/2024 in [facility] ED from 03/26/2024 in [name] Emergency Department at [facility] ED from 03/25/2024 in [facility]  C-SSRS RISK CATEGORY No Risk No Risk No Risk    Last [facility] 2/9 Scores:     No data to display          Scribe for Treatment Team: Pamila Nine, KEN 08/25/2024 11:08 AM

## 2024-08-25 NOTE — Progress Notes (Signed)
(  Sleep Hours) - 9.50 (Any PRNs that were needed, meds refused, or side effects to meds)- None (Any disturbances and when (visitation, over night)- None reported/observed (Concerns raised by the patient)- None reported/observed (SI/HI/AVH)- Denies

## 2024-08-25 NOTE — Group Note (Signed)
 Date:  08/25/2024 Time:  10:35 AM  Group Topic/Focus:  Coping With Mental Health Crisis:   The purpose of this group is to help patients identify strategies for coping with mental health crisis.  Group discusses possible causes of crisis and ways to manage them effectively.    Participation Level:  Did Not Attend    [name] 08/25/2024, 10:35 AM

## 2024-08-25 NOTE — Group Note (Signed)
 LCSW Group Therapy Note  Group Date: 08/25/2024 Start Time: 1032 End Time: 1110   Type of Therapy and Topic:  Group Therapy - Healthy vs Unhealthy Coping Skills  Participation Level:  Did Not Attend   Description of Group The focus of this group was to determine what unhealthy coping techniques typically are used by group members and what healthy coping techniques would be helpful in coping with various problems. Patients were guided in becoming aware of the differences between healthy and unhealthy coping techniques. Patients were asked to identify 2-3 healthy coping skills they would like to learn to use more effectively.  Therapeutic Goals Patients learned that coping is what human beings do all day long to deal with various situations in their lives Patients defined and discussed healthy vs unhealthy coping techniques Patients identified their preferred coping techniques and identified whether these were healthy or unhealthy Patients determined 2-3 healthy coping skills they would like to become more familiar with and use more often. Patients provided support and ideas to each other   Summary of Patient Progress:  Patient did not attend group.   Therapeutic Modalities Cognitive Behavioral Therapy Motivational Interviewing  Mackensi Mahadeo W Telly Jawad, LCSWA 08/25/2024  12:14 PM

## 2024-08-25 NOTE — Plan of Care (Signed)
   Problem: Education: Goal: Knowledge of General Education information will improve Description Including pain rating scale, medication(s)/side effects and non-pharmacologic comfort measures Outcome: Progressing   Problem: Health Behavior/Discharge Planning: Goal: Ability to manage health-related needs will improve Outcome: Progressing

## 2024-08-25 NOTE — Progress Notes (Signed)
 Mobility Specialist Progress Note:    08/25/24 1610  Mobility  Activity Ambulated with assistance  Level of Assistance Contact guard assist, steadying assist  Assistive Device Front wheel walker  Distance Ambulated (ft) 200 ft  Range of Motion/Exercises Active;All extremities  Activity Response Tolerated well  Mobility visit 1 Mobility  Mobility Specialist Start Time (ACUTE ONLY) 1547  Mobility Specialist Stop Time (ACUTE ONLY) 1604  Mobility Specialist Time Calculation (min) (ACUTE ONLY) 17 min   Pt received in bed, agreeable to mobility. Donned socks independently while sitting EOB, required CGA to stand and ambulate with RW. Tolerated well, requires verbal cues for correct positioning with RW. Returned to room, left pt sitting EOB. All needs met.  Sherrilee Ditty Mobility Specialist Please contact via Special Educational Needs Teacher or  Rehab office at [phone]

## 2024-08-26 DIAGNOSIS — F22 Delusional disorders: Secondary | ICD-10-CM | POA: Diagnosis not present

## 2024-08-26 DIAGNOSIS — F0390 Unspecified dementia without behavioral disturbance: Secondary | ICD-10-CM | POA: Diagnosis not present

## 2024-08-26 NOTE — Plan of Care (Signed)
   Problem: Activity: Goal: Risk for activity intolerance will decrease Outcome: Progressing   Problem: Nutrition: Goal: Adequate nutrition will be maintained Outcome: Progressing   Problem: Coping: Goal: Level of anxiety will decrease Outcome: Progressing   Problem: Safety: Goal: Ability to remain free from injury will improve Outcome: Progressing

## 2024-08-26 NOTE — Group Note (Signed)
 Date:  08/26/2024 Time:  8:59 PM  Group Topic/Focus:  Wrap-Up Group:   The focus of this group is to help patients review their daily goal of treatment and discuss progress on daily workbooks.    Participation Level:  Active  Participation Quality:  Appropriate  Affect:  Appropriate  Cognitive:  Alert  Insight: Appropriate  Engagement in Group:  Engaged  Modes of Intervention:  Discussion  Additional Comments:    Benjamin [name] [name] 08/26/2024, 8:59 PM

## 2024-08-26 NOTE — Group Note (Signed)
 Date:  08/26/2024 Time:  3:53 PM  Group Topic/Focus:  Wellness Toolbox:   The focus of this group is to discuss various aspects of wellness, balancing those aspects and exploring ways to increase the ability to experience wellness.  Patients will create a wellness toolbox for use upon discharge.    Participation Level:  Did Not Attend  Larrie Leita BRAVO 08/26/2024, 3:53 PM

## 2024-08-26 NOTE — Progress Notes (Signed)
 [facility] MD Progress Note  08/26/2024 2:39 PM Benjamin [name]  MRN:  [id number] Benjamin [name] is a 79 year old male who presents to the inpatient geriatric psych unit after jumping from a two story building. Patient was originally seen at [facility] who referred him to the ED who admitted him here on 03/27/2024.    Subjective: Chart is reviewed and discussed with the treatment team.  APS is working on legal guardianship and placement.   At times he is more confused and needs redirection's denies any suicidal and homicidal thoughts   Psychiatric History: see h&P  Family History:  Family History  Problem Relation Age of Onset   Colon cancer Neg Hx    Colon polyps Neg Hx    Stomach cancer Neg Hx    Esophageal cancer Neg Hx    Social History:  Social History   Substance and Sexual Activity  Alcohol Use No     Social History   Substance and Sexual Activity  Drug Use No    Social History   Socioeconomic History   Marital status: Single    Spouse name: Not on file   Number of children: Not on file   Years of education: Not on file   Highest education level: Not on file  Occupational History   Not on file  Tobacco Use   Smoking status: Former    Types: Cigarettes   Smokeless tobacco: Never  Vaping Use   Vaping status: Never Used  Substance and Sexual Activity   Alcohol use: No   Drug use: No   Sexual activity: Not on file  Other Topics Concern   Not on file  Social History Narrative   Not on file   Social Drivers of Health   Tobacco Use: Medium Risk (03/27/2024)   Patient History    Smoking Tobacco Use: Former    Smokeless Tobacco Use: Never    Passive Exposure: Not on Actuary Strain: Not on file  Food Insecurity: No Food Insecurity (03/27/2024)   Epic    Worried About Programme Researcher, Broadcasting/film/video in the Last Year: Never true    Ran Out of Food in the Last Year: Never true  Transportation Needs: No Transportation Needs (03/27/2024)   Epic     Lack of Transportation (Medical): No    Lack of Transportation (Non-Medical): No  Physical Activity: Not on file  Stress: Not on file  Social Connections: Moderately Integrated (03/27/2024)   Social Connection and Isolation Panel    Frequency of Communication with Friends and Family: Twice a week    Frequency of Social Gatherings with Friends and Family: Once a week    Attends Religious Services: 1 to 4 times per year    Active Member of [employer] or Organizations: No    Attends Banker Meetings: 1 to 4 times per year    Marital Status: Divorced  Depression (PHQ2-9): Not on file  Alcohol Screen: Low Risk (03/27/2024)   Alcohol Screen    Last Alcohol Screening Score (AUDIT): 0  Housing: Low Risk (03/27/2024)   Epic    Unable to Pay for Housing in the Last Year: No    Number of Times Moved in the Last Year: 0    Homeless in the Last Year: No  Utilities: Not At Risk (03/27/2024)   Epic    Threatened with loss of utilities: No  Health Literacy: Not on file   Past Medical History:  Past Medical History:  Diagnosis Date   Allergy    Arthritis    back   Prostate cancer (HCC) dx'd 2010   surg only    Past Surgical History:  Procedure Laterality Date   COLONOSCOPY     EYE SURGERY     PROSTATE SURGERY      Current Medications: Current Facility-Administered Medications  Medication Dose Route Frequency Provider Last Rate Last Admin   acetaminophen  (TYLENOL ) tablet 650 mg  650 mg Oral Q6H PRN Coleman, Carolyn H, NP       alum & mag hydroxide-simeth (MAALOX/MYLANTA) 200-200-20 MG/5ML suspension 30 mL  30 mL Oral Q4H PRN Coleman, Carolyn H, NP       docusate sodium  (COLACE) capsule 100 mg  100 mg Oral Daily Jadapalle, Sree, MD   100 mg at 08/26/24 9142   fluPHENAZine  (PROLIXIN ) tablet 10 mg  10 mg Oral BID Shrivastava, Aryendra, MD   10 mg at 08/26/24 0857   magnesium  hydroxide (MILK OF MAGNESIA) suspension 30 mL  30 mL Oral Daily PRN Coleman, Carolyn H, NP       OLANZapine   (ZYPREXA ) injection 5 mg  5 mg Intramuscular TID PRN Mardy Elveria DEL, NP   5 mg at 04/12/24 2230   OLANZapine  zydis (ZYPREXA ) disintegrating tablet 5 mg  5 mg Oral TID PRN Coleman, Carolyn H, NP   5 mg at 07/22/24 0034   polyethylene glycol (MIRALAX  / GLYCOLAX ) packet 17 g  17 g Oral Daily PRN Donnelly Mellow, MD   17 g at 06/04/24 2123    Lab Results:  No results found for this or any previous visit (from the past 48 hours).      Blood Alcohol level:  Lab Results  Component Value Date   [facility] <15 03/26/2024    Metabolic Disorder Labs: Lab Results  Component Value Date   HGBA1C 5.6 04/02/2024   MPG 114 04/02/2024   No results found for: PROLACTIN Lab Results  Component Value Date   CHOL 146 04/02/2024   TRIG 37 04/02/2024   HDL 43 04/02/2024   CHOLHDL 3.4 04/02/2024   VLDL 7 04/02/2024   LDLCALC 96 04/02/2024    Physical Findings: AIMS:  , ,  ,  ,    CIWA:    COWS:      Psychiatric Specialty Exam:  Presentation  General Appearance:  Appropriate for Environment; Bizarre  Eye Contact: Fair  Speech: Normal rate  Speech Volume: Normal    Mood and Affect  Mood: Fine Affect: Congruent   Thought Process  Thought Processes: Disorganized At baseline Descriptions of Associations:Intact  Orientation:Full (Time, Place and Person)  Thought Content: Impoverished Hallucinations: Denies  Ideas of Reference: denies Suicidal Thoughts: Denies  Homicidal Thoughts: Denies   Sensorium  Memory: impiared  Judgment: Impaired  Insight: Community Education Officer  Concentration: Fair  Attention Span: Fair  Recall: [employer] of Knowledge: Fair  Language: Fair   Psychomotor Activity  Psychomotor Activity: No data recorded  Musculoskeletal: Strength & Muscle Tone: within normal limits Gait & Station: normal Assets  Assets: Manufacturing Systems Engineer; Resilience    Physical Exam: Physical Exam Vitals and nursing note  reviewed.    ROS Blood pressure 122/78, pulse 74, temperature 98 F (36.7 C), resp. rate 17, height 6' (1.829 m), weight 100 kg, SpO2 97%. Body mass index is 29.91 kg/m.  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  Patient needs to be monitored closely for ongoing psychosis and paranoid delusions.  Given patient's confusion and chronic paranoia, no family support, APS has taken up guardianship and looking for placement     Safety and Monitoring:             -- InVoluntary admission to inpatient psychiatric unit for safety, stabilization and treatment             -- Daily contact with patient to assess and evaluate symptoms and progress in treatment             -- Patient's case to be discussed in multi-disciplinary team meeting             -- Observation Level: q15 minute checks             -- Vital signs:  q12 hours             -- Precautions: suicide, elopement, and assault   2. Psychiatric Diagnoses and Treatment:              Continue Prolixin  to 10 mg twice daily.  -- The risks/benefits/side-effects/alternatives to this medication were discussed in detail with the patient and time was given for questions. The patient consents to medication trial.                -- Metabolic profile and EKG monitoring obtained while on an atypical antipsychotic (BMI: Lipid Panel: HbgA1c: QTc:)              -- Encouraged patient to participate in unit milieu and in scheduled group therapies                            3. Medical Issues Being Addressed:    Fall on/8/25, CT head normal  4. Discharge Planning:   -- Social work and case management to assist  with discharge planning and identification of hospital follow-up needs prior to discharge  -- Estimated LOS: 3-4 days  Millie JONELLE Manners, MD 08/26/2024, 2:39 PM

## 2024-08-26 NOTE — Progress Notes (Signed)
 Mobility Specialist Progress Note:    08/26/24 1402  Mobility  Activity Ambulated with assistance  Level of Assistance Contact guard assist, steadying assist  Assistive Device Front wheel walker  Distance Ambulated (ft) 250 ft  Range of Motion/Exercises Active;All extremities  Activity Response Tolerated well  Mobility visit 1 Mobility  Mobility Specialist Start Time (ACUTE ONLY) 1319  Mobility Specialist Stop Time (ACUTE ONLY) 1328  Mobility Specialist Time Calculation (min) (ACUTE ONLY) 9 min   Pt received in day room, agreeable to mobility. Required CGA to stand and ambulate with RW. Tolerated well, requires verbal cues for correct positioning with RW and reminders to utilize RW. Returned pt to day room, all needs met.   Sherrilee Ditty Mobility Specialist Please contact via Special Educational Needs Teacher or  Rehab office at [phone]

## 2024-08-26 NOTE — Group Note (Signed)
 [facility] LCSW Group Therapy Note   Group Date: 08/26/2024 Start Time: 1030 End Time: 1115   Type of Therapy/Topic:  Group Therapy:  Emotion Regulation  Participation Level:  Did Not Attend   Mood:Not able to assess pt.  Pt did not attend group.  Description of Group:    The purpose of this group is to assist patients in learning to regulate negative emotions and experience positive emotions. Patients will be guided to discuss ways in which they have been vulnerable to their negative emotions. These vulnerabilities will be juxtaposed with experiences of positive emotions or situations, and patients challenged to use positive emotions to combat negative ones. Special emphasis will be placed on coping with negative emotions in conflict situations, and patients will process healthy conflict resolution skills.  Therapeutic Goals: Patient will identify two positive emotions or experiences to reflect on in order to balance out negative emotions:  Patient will label two or more emotions that they find the most difficult to experience:  Patient will be able to demonstrate positive conflict resolution skills through discussion or role plays:   Summary of Patient Progress:  Pt did not participate in group.   Therapeutic Modalities:   Cognitive Behavioral Therapy Feelings Identification Dialectical Behavioral Therapy   Rexene LELON Mae, LCSWA

## 2024-08-26 NOTE — Progress Notes (Signed)
 Behavior:   Moderate confusion.  Pleasant and cooperative.   Psych assessment:  Denies SI/HI and AVH.  Interaction / Group attendance:  Present in the milieu.  Minimal interaction with peers and staff.  Medication/ PRNs: Compliant.   Pain: Denies.   15 min checks in place for safety.

## 2024-08-26 NOTE — Group Note (Signed)
 Date:  08/26/2024 Time:  2:16 PM  Group Topic/Focus:  Goals Group:   The focus of this group is to help patients establish daily goals to achieve during treatment and discuss how the patient can incorporate goal setting into their daily lives to aide in recovery.    Participation Level:  Did Not Attend   Larrie Leita BRAVO 08/26/2024, 2:16 PM

## 2024-08-26 NOTE — Plan of Care (Signed)
" °  Problem: Nutrition: Goal: Adequate nutrition will be maintained Outcome: Progressing   Problem: Pain Managment: Goal: General experience of comfort will improve and/or be controlled Outcome: Progressing   Problem: Education: Goal: Knowledge of General Education information will improve Description: Including pain rating scale, medication(s)/side effects and non-pharmacologic comfort measures Outcome: Not Progressing   Problem: Education: Goal: Verbalization of understanding the information provided will improve Outcome: Not Progressing   "

## 2024-08-26 NOTE — Progress Notes (Signed)
 [name] MD Progress Note  08/26/2024 6:01 PM Benjamin [name]  MRN:  [id number] Benjamin [name] is a 79 year old male who presents to the inpatient geriatric psych unit after jumping from a two story building. Patient was originally seen at [name] who referred him to the ED who admitted him here on 03/27/2024.    Subjective: Chart is reviewed and discussed with the treatment team.  APS is working on legal guardianship and placement.   Patient spends most of the time following the schedule he is confused but needs minimal redirection denies any suicidal or homicidal thoughts he is waiting for placement   Psychiatric History: see h&P  Family History:  Family History  Problem Relation Age of Onset   Colon cancer Neg Hx    Colon polyps Neg Hx    Stomach cancer Neg Hx    Esophageal cancer Neg Hx    Social History:  Social History   Substance and Sexual Activity  Alcohol Use No     Social History   Substance and Sexual Activity  Drug Use No    Social History   Socioeconomic History   Marital status: Single    Spouse name: Not on file   Number of children: Not on file   Years of education: Not on file   Highest education level: Not on file  Occupational History   Not on file  Tobacco Use   Smoking status: Former    Types: Cigarettes   Smokeless tobacco: Never  Vaping Use   Vaping status: Never Used  Substance and Sexual Activity   Alcohol use: No   Drug use: No   Sexual activity: Not on file  Other Topics Concern   Not on file  Social History Narrative   Not on file   Social Drivers of Health   Tobacco Use: Medium Risk (03/27/2024)   Patient History    Smoking Tobacco Use: Former    Smokeless Tobacco Use: Never    Passive Exposure: Not on Actuary Strain: Not on file  Food Insecurity: No Food Insecurity (03/27/2024)   Epic    Worried About Programme Researcher, Broadcasting/film/video in the Last Year: Never true    Ran Out of Food in the Last Year: Never  true  Transportation Needs: No Transportation Needs (03/27/2024)   Epic    Lack of Transportation (Medical): No    Lack of Transportation (Non-Medical): No  Physical Activity: Not on file  Stress: Not on file  Social Connections: Moderately Integrated (03/27/2024)   Social Connection and Isolation Panel    Frequency of Communication with Friends and Family: Twice a week    Frequency of Social Gatherings with Friends and Family: Once a week    Attends Religious Services: 1 to 4 times per year    Active Member of [employer] or Organizations: No    Attends Banker Meetings: 1 to 4 times per year    Marital Status: Divorced  Depression (PHQ2-9): Not on file  Alcohol Screen: Low Risk (03/27/2024)   Alcohol Screen    Last Alcohol Screening Score (AUDIT): 0  Housing: Low Risk (03/27/2024)   Epic    Unable to Pay for Housing in the Last Year: No    Number of Times Moved in the Last Year: 0    Homeless in the Last Year: No  Utilities: Not At Risk (03/27/2024)   Epic    Threatened with loss of utilities: No  Health Literacy: Not  on file   Past Medical History:  Past Medical History:  Diagnosis Date   Allergy    Arthritis    back   Prostate cancer (HCC) dx'd 2010   surg only    Past Surgical History:  Procedure Laterality Date   COLONOSCOPY     EYE SURGERY     PROSTATE SURGERY      Current Medications: Current Facility-Administered Medications  Medication Dose Route Frequency Provider Last Rate Last Admin   acetaminophen  (TYLENOL ) tablet 650 mg  650 mg Oral Q6H PRN Coleman, Carolyn H, NP       alum & mag hydroxide-simeth (MAALOX/MYLANTA) 200-200-20 MG/5ML suspension 30 mL  30 mL Oral Q4H PRN Coleman, Carolyn H, NP       docusate sodium  (COLACE) capsule 100 mg  100 mg Oral Daily Jadapalle, Sree, MD   100 mg at 08/26/24 9142   fluPHENAZine  (PROLIXIN ) tablet 10 mg  10 mg Oral BID Shrivastava, Aryendra, MD   10 mg at 08/26/24 0857   magnesium  hydroxide (MILK OF MAGNESIA) suspension  30 mL  30 mL Oral Daily PRN Coleman, Carolyn H, NP       OLANZapine  (ZYPREXA ) injection 5 mg  5 mg Intramuscular TID PRN Mardy Elveria DEL, NP   5 mg at 04/12/24 2230   OLANZapine  zydis (ZYPREXA ) disintegrating tablet 5 mg  5 mg Oral TID PRN Coleman, Carolyn H, NP   5 mg at 07/22/24 0034   polyethylene glycol (MIRALAX  / GLYCOLAX ) packet 17 g  17 g Oral Daily PRN Donnelly Mellow, MD   17 g at 06/04/24 2123    Lab Results:  No results found for this or any previous visit (from the past 48 hours).      Blood Alcohol level:  Lab Results  Component Value Date   [facility] <15 03/26/2024    Metabolic Disorder Labs: Lab Results  Component Value Date   HGBA1C 5.6 04/02/2024   MPG 114 04/02/2024   No results found for: PROLACTIN Lab Results  Component Value Date   CHOL 146 04/02/2024   TRIG 37 04/02/2024   HDL 43 04/02/2024   CHOLHDL 3.4 04/02/2024   VLDL 7 04/02/2024   LDLCALC 96 04/02/2024    Physical Findings: AIMS:  , ,  ,  ,    CIWA:    COWS:      Psychiatric Specialty Exam:  Presentation  General Appearance:  Appropriate for Environment; Bizarre  Eye Contact: Fair  Speech: Normal rate  Speech Volume: Normal    Mood and Affect  Mood: Fine Affect: Congruent   Thought Process  Thought Processes: Disorganized At baseline Descriptions of Associations:Intact  Orientation:Full (Time, Place and Person)  Thought Content: Impoverished Hallucinations: Denies  Ideas of Reference: denies Suicidal Thoughts: Denies  Homicidal Thoughts: Denies   Sensorium  Memory: impiared  Judgment: Impaired  Insight: Community Education Officer  Concentration: Fair  Attention Span: Fair  Recall: [employer] of Knowledge: Fair  Language: Fair   Psychomotor Activity  Psychomotor Activity: No data recorded  Musculoskeletal: Strength & Muscle Tone: within normal limits Gait & Station: normal Assets  Assets: Manufacturing Systems Engineer;  Resilience    Physical Exam: Physical Exam Vitals and nursing note reviewed.    ROS Blood pressure 122/78, pulse 74, temperature 98 F (36.7 C), resp. rate 17, height 6' (1.829 m), weight 100 kg, SpO2 97%. Body mass index is 29.91 kg/m.  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  Patient needs to be monitored closely for ongoing psychosis and paranoid delusions.  Given patient's confusion and chronic paranoia, no family support, APS has taken up guardianship and looking for placement     Safety and Monitoring:             -- InVoluntary admission to inpatient psychiatric unit for safety, stabilization and treatment             -- Daily contact with patient to assess and evaluate symptoms and progress in treatment             -- Patient's case to be discussed in multi-disciplinary team meeting             -- Observation Level: q15 minute checks             -- Vital signs:  q12 hours             -- Precautions: suicide, elopement, and assault   2. Psychiatric Diagnoses and Treatment:              Continue Prolixin  to 10 mg twice daily.  -- The risks/benefits/side-effects/alternatives to this medication were discussed in detail with the patient and time was given for questions. The patient consents to medication trial.                -- Metabolic profile and EKG monitoring obtained while on an atypical antipsychotic (BMI: Lipid Panel: HbgA1c: QTc:)              -- Encouraged patient to participate in unit milieu and in scheduled group therapies                            3. Medical Issues Being Addressed:    Fall on/8/25, CT head  normal  4. Discharge Planning:   -- Social work and case management to assist with discharge planning and identification of hospital follow-up needs prior to discharge  -- Estimated LOS: 3-4 days  Millie JONELLE Manners, MD 08/26/2024, 6:01 PM

## 2024-08-26 NOTE — Progress Notes (Signed)
" °   08/25/24 2100  Psych Admission Type (Psych Patients Only)  Admission Status Involuntary  Psychosocial Assessment  Patient Complaints None  Eye Contact Brief  Facial Expression Flat  Affect Appropriate to circumstance  Speech Soft  Interaction Isolative  Motor Activity Slow  Appearance/Hygiene In scrubs  Behavior Characteristics Cooperative  Mood Pleasant  Thought Process  Coherency WDL  Content WDL  Delusions None reported or observed  Perception WDL  Hallucination None reported or observed  Judgment Impaired  Confusion Moderate  Danger to Self  Current suicidal ideation? Denies    "

## 2024-08-27 MED ORDER — TUBERCULIN PPD 5 UNIT/0.1ML ID SOLN
5.0000 [IU] | Freq: Once | INTRADERMAL | Status: AC
  Administered 2024-08-27: 5 [IU] via INTRADERMAL
  Filled 2024-08-27: qty 0.1

## 2024-08-27 NOTE — Group Note (Signed)
 Date:  08/27/2024 Time:  8:59 PM  Group Topic/Focus:  Wrap-Up Group:   The focus of this group is to help patients review their daily goal of treatment and discuss progress on daily workbooks.    Participation Level:  Active  Participation Quality:  Appropriate  Affect:  Appropriate  Cognitive:  Appropriate  Insight: Good  Engagement in Group:  Engaged  Modes of Intervention:  Discussion  Additional Comments:    Benjamin [name] Bunker 08/27/2024, 8:59 PM

## 2024-08-27 NOTE — Progress Notes (Signed)
 Mobility Specialist Progress Note:    08/27/24 1443  Mobility  Activity Ambulated with assistance  Level of Assistance Contact guard assist, steadying assist  Assistive Device Front wheel walker  Distance Ambulated (ft) 350 ft  Range of Motion/Exercises Active;All extremities  Activity Response Tolerated well  Mobility visit 1 Mobility  Mobility Specialist Start Time (ACUTE ONLY) 1430  Mobility Specialist Stop Time (ACUTE ONLY) 1439  Mobility Specialist Time Calculation (min) (ACUTE ONLY) 9 min   Pt received in bed, agreeable to mobility. Required CGA/SBA to stand and ambulate with RW. Tolerated well, requires verbal cues for correct positioning with RW. Returned pt to room, all needs met.  Sherrilee Ditty Mobility Specialist Please contact via Special Educational Needs Teacher or  Rehab office at [phone]

## 2024-08-27 NOTE — BHH Counselor (Signed)
 CSW received call from [name] [phone])   Arch reports that pt could be moved to [facility] possibly this week but reports she needs an updated FL2 and TB test.   CSW informed care team.   Lum Croft, MSW, [facility] 08/27/2024 1:13 PM

## 2024-08-27 NOTE — NC FL2 (Deleted)
 " [place]  MEDICAID [id number] LEVEL OF CARE FORM     IDENTIFICATION  Patient Name: Benjamin [name] Birthdate: Apr 02, 1946 Sex: male Admission Date (Current Location): 03/27/2024  [place] and [place] Number:  [place] [id number] o Facility and Address:  [facility], [address], [place], [place] [address]      Provider Number: [id number]  Attending Physician Name and Address:  Donnelly Mellow, MD  Relative Name and Phone Number:  [name], APS Caseworker, [phone]    Current Level of Care: Hospital Recommended Level of Care: Assisted Living Facility Prior Approval Number:    Date Approved/Denied:   PASRR Number:    Discharge Plan: Other (Comment) (Assisted Living Facility)    Current Diagnoses: Patient Active Problem List   Diagnosis Date Noted   Major neurocognitive disorder (HCC) 07/09/2024   Laceration of left eyebrow 07/01/2024   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   Psychosis (HCC) 03/26/2024    Orientation RESPIRATION BLADDER Height & Weight     Self, Place, Time  Normal Incontinent Weight: 220 lb 8 oz (100 kg) Height:  6' (182.9 cm)  BEHAVIORAL SYMPTOMS/MOOD NEUROLOGICAL BOWEL NUTRITION STATUS  Dangerous to self, others or property, Other (Comment) (Others or property.)   Continent    AMBULATORY STATUS COMMUNICATION OF NEEDS Skin   Independent Verbally Normal                       Personal Care Assistance Level of Assistance              Functional Limitations Info  Speech     Speech Info: Adequate    SPECIAL CARE FACTORS FREQUENCY  Blood pressure (Blood Pressure 2X per day) Blood Pressure Frequency: 2X daily.                  Contractures Contractures Info: Not present    Additional Factors Info  Code Status, Psychotropic, Allergies (FULL) Code Status Info: Full Allergies Info: None Psychotropic Info: fluPHENAZine  (PROLIXIN ) tablet 10 mg         Current Medications (08/27/2024):  This is the  current hospital active medication list Current Facility-Administered Medications  Medication Dose Route Frequency Provider Last Rate Last Admin   acetaminophen  (TYLENOL ) tablet 650 mg  650 mg Oral Q6H PRN Coleman, Carolyn H, NP       alum & mag hydroxide-simeth (MAALOX/MYLANTA) 200-200-20 MG/5ML suspension 30 mL  30 mL Oral Q4H PRN Coleman, Carolyn H, NP       docusate sodium  (COLACE) capsule 100 mg  100 mg Oral Daily Jadapalle, Sree, MD   100 mg at 08/27/24 0818   fluPHENAZine  (PROLIXIN ) tablet 10 mg  10 mg Oral BID Shrivastava, Aryendra, MD   10 mg at 08/27/24 0818   magnesium  hydroxide (MILK OF MAGNESIA) suspension 30 mL  30 mL Oral Daily PRN Coleman, Carolyn H, NP       OLANZapine  (ZYPREXA ) injection 5 mg  5 mg Intramuscular TID PRN Coleman, Carolyn H, NP   5 mg at 04/12/24 2230   OLANZapine  zydis (ZYPREXA ) disintegrating tablet 5 mg  5 mg Oral TID PRN Coleman, Carolyn H, NP   5 mg at 07/22/24 0034   polyethylene glycol (MIRALAX  / GLYCOLAX ) packet 17 g  17 g Oral Daily PRN Donnelly Mellow, MD   17 g at 06/04/24 2123     Discharge Medications: Please see discharge summary for a list of discharge medications.  Relevant Imaging Results:  Relevant Lab Results:   Additional Information SSN#  [id number]  [name], LCSWA     "

## 2024-08-27 NOTE — Group Note (Signed)
 Recreation Therapy Group Note   Group Topic:Stress Management  Group Date: 08/27/2024 Start Time: 1415 End Time: 1440 Facilitators: Celestia Jeoffrey BRAVO, LRT, CTRS Location: Dayroom  Group Description: Meditation. LRT asks patients their current level of stress/anxiety from 1-10, with 10 being the highest. LRT educated on the benefits of meditation and relaxation techniques, and how it can apply to everyday life post-discharge. LRT and pt's followed along to an audio script of a guided meditation video. LRT asked pt their level of stress and anxiety once the prompt was finished. LRT facilitated post-activity processing to gain feedback on session.   Goal Area(s) Addressed:  Patient will practice using relaxation technique. Patient will identify a new coping skill.  Patient will follow multistep directions to reduce anxiety and stress.   Affect/Mood: N/A   Participation Level: Did not attend    Clinical Observations/Individualized Feedback: Patient did not attend.  Plan: Continue to engage patient in RT group sessions 2-3x/week.   Jeoffrey BRAVO Celestia, LRT, CTRS 08/27/2024 4:52 PM

## 2024-08-27 NOTE — Plan of Care (Signed)
   Problem: Education: Goal: Knowledge of General Education information will improve Description Including pain rating scale, medication(s)/side effects and non-pharmacologic comfort measures Outcome: Progressing   Problem: Health Behavior/Discharge Planning: Goal: Ability to manage health-related needs will improve Outcome: Progressing

## 2024-08-27 NOTE — Progress Notes (Signed)
 PPD placed to left forearm. This is for discharge placement.

## 2024-08-27 NOTE — Progress Notes (Signed)
 [facility] MD Progress Note  08/27/2024 11:51 PM Bradyn Soward  MRN:  [id number]   Subjective:  Chart reviewed, case discussed in multidisciplinary meeting, patient seen during rounds.   Patient is noted to be standing in his room.  He informed the provider that he did not attend the groups today as he was planning to take shower.  He did endorse having some visual disturbances but is able to manage himself in his room.  Patient was encouraged to use his walker to minimize the falls.  Patient is able to remember the 2 falls he had in the hospital patient denies SI/HI/plan  Past Psychiatric History: see h&P Family History:  Family History  Problem Relation Age of Onset   Colon cancer Neg Hx    Colon polyps Neg Hx    Stomach cancer Neg Hx    Esophageal cancer Neg Hx    Social History:  Social History   Substance and Sexual Activity  Alcohol Use No     Social History   Substance and Sexual Activity  Drug Use No    Social History   Socioeconomic History   Marital status: Single    Spouse name: Not on file   Number of children: Not on file   Years of education: Not on file   Highest education level: Not on file  Occupational History   Not on file  Tobacco Use   Smoking status: Former    Types: Cigarettes   Smokeless tobacco: Never  Vaping Use   Vaping status: Never Used  Substance and Sexual Activity   Alcohol use: No   Drug use: No   Sexual activity: Not on file  Other Topics Concern   Not on file  Social History Narrative   Not on file   Social Drivers of Health   Tobacco Use: Medium Risk (03/27/2024)   Patient History    Smoking Tobacco Use: Former    Smokeless Tobacco Use: Never    Passive Exposure: Not on Actuary Strain: Not on file  Food Insecurity: No Food Insecurity (03/27/2024)   Epic    Worried About Programme Researcher, Broadcasting/film/video in the Last Year: Never true    Ran Out of Food in the Last Year: Never true  Transportation Needs: No Transportation  Needs (03/27/2024)   Epic    Lack of Transportation (Medical): No    Lack of Transportation (Non-Medical): No  Physical Activity: Not on file  Stress: Not on file  Social Connections: Moderately Integrated (03/27/2024)   Social Connection and Isolation Panel    Frequency of Communication with Friends and Family: Twice a week    Frequency of Social Gatherings with Friends and Family: Once a week    Attends Religious Services: 1 to 4 times per year    Active Member of [employer] or Organizations: No    Attends Banker Meetings: 1 to 4 times per year    Marital Status: Divorced  Depression (PHQ2-9): Not on file  Alcohol Screen: Low Risk (03/27/2024)   Alcohol Screen    Last Alcohol Screening Score (AUDIT): 0  Housing: Low Risk (03/27/2024)   Epic    Unable to Pay for Housing in the Last Year: No    Number of Times Moved in the Last Year: 0    Homeless in the Last Year: No  Utilities: Not At Risk (03/27/2024)   Epic    Threatened with loss of utilities: No  Health Literacy: Not on file  Past Medical History:  Past Medical History:  Diagnosis Date   Allergy    Arthritis    back   Prostate cancer (HCC) dx'd 2010   surg only    Past Surgical History:  Procedure Laterality Date   COLONOSCOPY     EYE SURGERY     PROSTATE SURGERY      Current Medications: Current Facility-Administered Medications  Medication Dose Route Frequency Provider Last Rate Last Admin   acetaminophen  (TYLENOL ) tablet 650 mg  650 mg Oral Q6H PRN Coleman, Carolyn H, NP       alum & mag hydroxide-simeth (MAALOX/MYLANTA) 200-200-20 MG/5ML suspension 30 mL  30 mL Oral Q4H PRN Coleman, Carolyn H, NP       docusate sodium  (COLACE) capsule 100 mg  100 mg Oral Daily Vinson Tietze, MD   100 mg at 08/27/24 0818   fluPHENAZine  (PROLIXIN ) tablet 10 mg  10 mg Oral BID Shrivastava, Aryendra, MD   10 mg at 08/27/24 2115   magnesium  hydroxide (MILK OF MAGNESIA) suspension 30 mL  30 mL Oral Daily PRN Coleman, Carolyn  H, NP       OLANZapine  (ZYPREXA ) injection 5 mg  5 mg Intramuscular TID PRN Mardy Elveria DEL, NP   5 mg at 04/12/24 2230   OLANZapine  zydis (ZYPREXA ) disintegrating tablet 5 mg  5 mg Oral TID PRN Coleman, Carolyn H, NP   5 mg at 07/22/24 0034   polyethylene glycol (MIRALAX  / GLYCOLAX ) packet 17 g  17 g Oral Daily PRN Donnelly Mellow, MD   17 g at 06/04/24 2123   tuberculin injection 5 Units  5 Units Intradermal Once Shatona Andujar, MD   5 Units at 08/27/24 1753    Lab Results: No results found for this or any previous visit (from the past 48 hours).  Blood Alcohol level:  Lab Results  Component Value Date   [name] <15 03/26/2024    Metabolic Disorder Labs: Lab Results  Component Value Date   HGBA1C 5.6 04/02/2024   MPG 114 04/02/2024   No results found for: PROLACTIN Lab Results  Component Value Date   CHOL 146 04/02/2024   TRIG 37 04/02/2024   HDL 43 04/02/2024   CHOLHDL 3.4 04/02/2024   VLDL 7 04/02/2024   LDLCALC 96 04/02/2024    Physical Findings: AIMS:  , ,  ,  ,    CIWA:    COWS:      Psychiatric Specialty Exam:  Presentation  General Appearance:  Appropriate for Environment; Bizarre  Eye Contact: Fair  Speech: Clear and Coherent  Speech Volume: Normal    Mood and Affect  Mood: Anxious  Affect: Congruent   Thought Process  Thought Processes: Disorganized  Orientation:Full (Time, Place and Person)  Thought Content:Illogical; Paranoid Ideation; Delusions  Hallucinations:No data recorded Ideas of Reference:Delusions; Paranoia  Suicidal Thoughts:No data recorded Homicidal Thoughts:No data recorded  Sensorium  Memory: Immediate Fair; Recent Fair; Remote Fair  Judgment: Impaired  Insight: Shallow   Executive Functions  Concentration: Fair  Attention Span: Fair  Recall: [employer] of Knowledge: Fair  Language: Fair   Psychomotor Activity  Psychomotor Activity:No data recorded Musculoskeletal: Strength &  Muscle Tone: decreased Gait & Station: unsteady Assets  Assets: Manufacturing Systems Engineer; Resilience    Physical Exam: Physical Exam Vitals and nursing note reviewed.    ROS Blood pressure 132/84, pulse 69, temperature 98.6 F (37 C), resp. rate 14, height 6' (1.829 m), weight 100 kg, SpO2 98%. Body mass index is 29.91 kg/m.  Diagnosis: Principal Problem:   Delusional disorder (HCC) Active Problems:   Laceration of left eyebrow   Major neurocognitive disorder (HCC)    Safety and Monitoring:             -- InVoluntary admission to inpatient psychiatric unit for safety, stabilization and treatment             -- Daily contact with patient to assess and evaluate symptoms and progress in treatment             -- Patient's case to be discussed in multi-disciplinary team meeting             -- Observation Level: q15 minute checks             -- Vital signs:  q12 hours             -- Precautions: suicide, elopement, and assault   2. Psychiatric Diagnoses and Treatment:              Continue Prolixin  to 10 mg twice daily.  -- The risks/benefits/side-effects/alternatives to this medication were discussed in detail with the patient and time was given for questions. The patient consents to medication trial.                -- Metabolic profile and EKG monitoring obtained while on an atypical antipsychotic (BMI: Lipid Panel: HbgA1c: QTc:)              -- Encouraged patient to participate in unit milieu and in scheduled group therapies                            3. Medical Issues Being Addressed:    Fall on/8/25, CT head normal  4. Discharge Planning:   -- Social work and case management to assist with discharge planning and identification of hospital follow-up needs prior to discharge  -- Estimated LOS: 3-4 days Keshaun Dubey, MD 08/27/2024, 11:51 PM

## 2024-08-27 NOTE — Group Note (Unsigned)
 Date:  08/27/2024 Time:  11:59 AM  Group Topic/Focus:  Goals Group:   The focus of this group is to help patients establish daily goals to achieve during treatment and discuss how the patient can incorporate goal setting into their daily lives to aide in recovery.     Participation Level:  {BHH PARTICIPATION OZCZO:77735}  Participation Quality:  {BHH PARTICIPATION QUALITY:22265}  Affect:  {BHH AFFECT:22266}  Cognitive:  {BHH COGNITIVE:22267}  Insight: {BHH Insight2:20797}  Engagement in Group:  {BHH ENGAGEMENT IN HMNLE:77731}  Modes of Intervention:  {BHH MODES OF INTERVENTION:22269}  Additional Comments:  ***  Maglione,Yanet Balliet E 08/27/2024, 11:59 AM

## 2024-08-27 NOTE — NC FL2 (Addendum)
 " [place]  MEDICAID FL2 LEVEL OF CARE FORM     IDENTIFICATION  Patient Name: Benjamin [name] Birthdate: 1946/02/04 Sex: male Admission Date (Current Location): 03/27/2024  [place] and [place] Number:  [place] [id number] o Facility and Address:  [facility], [address] [place], [place] [address]      Provider Number: [id number]  Attending Physician Name and Address:  Donnelly Mellow, MD  Relative Name and Phone Number:  [name], APS Caseworker, [phone]    Current Level of Care: Hospital Recommended Level of Care: Assisted Living Facility Prior Approval Number:    Date Approved/Denied:   PASRR Number:    Discharge Plan: Other (Comment) (Assisted Living Facility)    Current Diagnoses: Patient Active Problem List   Diagnosis Date Noted   Major neurocognitive disorder (HCC) 07/09/2024   Laceration of left eyebrow 07/01/2024   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   Psychosis (HCC) 03/26/2024    Orientation RESPIRATION BLADDER Height & Weight     Self, Place, Time  Normal Incontinent Weight: 220 lb 8 oz (100 kg) Height:  6' (182.9 cm)  BEHAVIORAL SYMPTOMS/MOOD NEUROLOGICAL BOWEL NUTRITION STATUS:  Regular Diet      Continent    AMBULATORY STATUS COMMUNICATION OF NEEDS Skin   Independent Verbally Normal                       Personal Care Assistance Level of Assistance              Functional Limitations Info  Speech     Speech Info: Adequate    SPECIAL CARE FACTORS FREQUENCY  Blood pressure (Blood Pressure 2X per day) Blood Pressure Frequency: 2X daily.                  Contractures Contractures Info: Not present    Additional Factors Info  Code Status, Psychotropic, Allergies (FULL) Code Status Info: Full Allergies Info: None Psychotropic Info: fluPHENAZine  (PROLIXIN ) tablet 10 mg         Current Medications (08/27/2024):  This is the current hospital active medication list Current  Facility-Administered Medications  Medication Dose Route Frequency Provider Last Rate Last Admin   acetaminophen  (TYLENOL ) tablet 650 mg  650 mg Oral Q6H PRN Coleman, Carolyn H, NP       alum & mag hydroxide-simeth (MAALOX/MYLANTA) 200-200-20 MG/5ML suspension 30 mL  30 mL Oral Q4H PRN Coleman, Carolyn H, NP       docusate sodium  (COLACE) capsule 100 mg  100 mg Oral Daily Jadapalle, Sree, MD   100 mg at 08/27/24 0818   fluPHENAZine  (PROLIXIN ) tablet 10 mg  10 mg Oral BID Shrivastava, Aryendra, MD   10 mg at 08/27/24 0818   magnesium  hydroxide (MILK OF MAGNESIA) suspension 30 mL  30 mL Oral Daily PRN Coleman, Carolyn H, NP       OLANZapine  (ZYPREXA ) injection 5 mg  5 mg Intramuscular TID PRN Coleman, Carolyn H, NP   5 mg at 04/12/24 2230   OLANZapine  zydis (ZYPREXA ) disintegrating tablet 5 mg  5 mg Oral TID PRN Coleman, Carolyn H, NP   5 mg at 07/22/24 0034   polyethylene glycol (MIRALAX  / GLYCOLAX ) packet 17 g  17 g Oral Daily PRN Donnelly Mellow, MD   17 g at 06/04/24 2123     Discharge Medications: Please see discharge summary for a list of discharge medications.  Relevant Imaging Results:  Relevant Lab Results:   Additional Information SSN# [id number]  [name], [place]     "

## 2024-08-27 NOTE — Plan of Care (Signed)
  Problem: Activity: Goal: Sleeping patterns will improve Outcome: Progressing   

## 2024-08-27 NOTE — Group Note (Signed)
 Physical/Occupational Therapy Group Note  Group Topic: Functional, Dynamic Balance   Group Date: 08/27/2024 Start Time: 1300 End Time: 1330 Facilitators: Lendy Dittrich, Alm Hamilton, PT   Group Description: Group discussed impact of balance on safety and independence with functional tasks.  Identified and discussed any self-perceived balance deficits to personalize information.  Discussed and reviewed strategies to address/improve balance deficits: use of assist devices, activity pacing/energy conservation, environment/home safety modifications, focusing attention/minimizing distraction.  Reviewed and participated with standing LE therex designed to target dynamic balance reactions and LE strength/stability; provided handouts with HEP to be utilized outside of group time as appropriate.  Allowed time for questions and further discussion on any balance or mobility concerns/needs.  Therapeutic Goal(s):  Identify and discuss any individual balance deficits and functional implications. Identify and discuss any environmental/home safety modifications that can optimize balance and safety for mobility within the home. Demonstrate understanding and performance of standing therex designed to target dynamic balance deficits.  Individual Participation: Did not attend   Participation Level:   Participation Quality:   Behavior:   Speech/Thought Process:   Affect/Mood:   Insight:   Judgement:   Modes of Intervention:   Patient Response to Interventions:    Plan: Continue to engage patient in PT/OT groups 1 - 2x/week.  CHARM Hamilton Bertin PT, DPT 08/27/2024, 1:55 PM

## 2024-08-27 NOTE — BHH Counselor (Signed)
 CSW sent FL2 to [name] per her request via email  Lum Croft, MSW, [facility] 08/27/2024 3:02 PM

## 2024-08-28 NOTE — Group Note (Signed)
 Recreation Therapy Group Note   Group Topic:Animal Assisted Therapy   Group Date: 08/28/2024 Start Time: 1020 End Time: 1045 Facilitators: Celestia Jeoffrey BRAVO, LRT, CTRS Location: Dayroom  Group Description: AAA. Animal-Assisted Activity provides opportunities for motivational, educational, therapeutic and/or recreational benefits to enhance quality of life. Selinda and Rollo visited the unit to interact with patients.   Goal Areas Addressed:  Reduced anxiety and stress Improved mood Increased social interaction Enhanced communication skills Reduced loneliness and isolation Improved emotional regulation   Affect/Mood: Appropriate and Flat   Participation Level: Non-verbal    Clinical Observations/Individualized Feedback: Benjamin [name] was present in the dayroom during group. Pt shared that he did not like dogs. Pt minimally interacted with LRT and peers while present.   Plan: Continue to engage patient in RT group sessions 2-3x/week.   Jeoffrey BRAVO Celestia, LRT, CTRS 08/28/2024 11:59 AM

## 2024-08-28 NOTE — Plan of Care (Signed)
   Problem: Activity: Goal: Interest or engagement in activities will improve Outcome: Progressing Goal: Sleeping patterns will improve Outcome: Progressing

## 2024-08-28 NOTE — Group Note (Signed)
 Date:  08/28/2024 Time:  4:02 PM  Group Topic/Focus:  Making Healthy Choices:   The focus of this group is to help patients identify negative/unhealthy choices they were using prior to admission and identify positive/healthier coping strategies to replace them upon discharge.    Participation Level:  Did Not Attend   Benjamin [name] 08/28/2024, 4:02 PM

## 2024-08-28 NOTE — Progress Notes (Signed)
 [facility] MD Progress Note  08/28/2024 8:16 PM Benjamin [name]  MRN:  [id number]   Subjective:  Chart reviewed, case discussed in multidisciplinary meeting, patient seen during rounds.   Patient is noted to sitting in his bed.  He offers no complaints.  He is able to identify the provider and have a linear conversation.  He remains discharge focused and wants to know about his transition to the community.  He denies having any physical pain or side effects to medications including EPS.  He denies SI/HI/plan and denies hallucinations.  He reports having good appetite and sleep  Past Psychiatric History: see h&P Family History:  Family History  Problem Relation Age of Onset   Colon cancer Neg Hx    Colon polyps Neg Hx    Stomach cancer Neg Hx    Esophageal cancer Neg Hx    Social History:  Social History   Substance and Sexual Activity  Alcohol Use No     Social History   Substance and Sexual Activity  Drug Use No    Social History   Socioeconomic History   Marital status: Single    Spouse name: Not on file   Number of children: Not on file   Years of education: Not on file   Highest education level: Not on file  Occupational History   Not on file  Tobacco Use   Smoking status: Former    Types: Cigarettes   Smokeless tobacco: Never  Vaping Use   Vaping status: Never Used  Substance and Sexual Activity   Alcohol use: No   Drug use: No   Sexual activity: Not on file  Other Topics Concern   Not on file  Social History Narrative   Not on file   Social Drivers of Health   Tobacco Use: Medium Risk (03/27/2024)   Patient History    Smoking Tobacco Use: Former    Smokeless Tobacco Use: Never    Passive Exposure: Not on Actuary Strain: Not on file  Food Insecurity: No Food Insecurity (03/27/2024)   Epic    Worried About Programme Researcher, Broadcasting/film/video in the Last Year: Never true    Ran Out of Food in the Last Year: Never true  Transportation Needs: No Transportation  Needs (03/27/2024)   Epic    Lack of Transportation (Medical): No    Lack of Transportation (Non-Medical): No  Physical Activity: Not on file  Stress: Not on file  Social Connections: Moderately Integrated (03/27/2024)   Social Connection and Isolation Panel    Frequency of Communication with Friends and Family: Twice a week    Frequency of Social Gatherings with Friends and Family: Once a week    Attends Religious Services: 1 to 4 times per year    Active Member of [employer] or Organizations: No    Attends Banker Meetings: 1 to 4 times per year    Marital Status: Divorced  Depression (PHQ2-9): Not on file  Alcohol Screen: Low Risk (03/27/2024)   Alcohol Screen    Last Alcohol Screening Score (AUDIT): 0  Housing: Low Risk (03/27/2024)   Epic    Unable to Pay for Housing in the Last Year: No    Number of Times Moved in the Last Year: 0    Homeless in the Last Year: No  Utilities: Not At Risk (03/27/2024)   Epic    Threatened with loss of utilities: No  Health Literacy: Not on file   Past Medical History:  Past Medical History:  Diagnosis Date   Allergy    Arthritis    back   Prostate cancer (HCC) dx'd 2010   surg only    Past Surgical History:  Procedure Laterality Date   COLONOSCOPY     EYE SURGERY     PROSTATE SURGERY      Current Medications: Current Facility-Administered Medications  Medication Dose Route Frequency Provider Last Rate Last Admin   acetaminophen  (TYLENOL ) tablet 650 mg  650 mg Oral Q6H PRN Coleman, Carolyn H, NP       alum & mag hydroxide-simeth (MAALOX/MYLANTA) 200-200-20 MG/5ML suspension 30 mL  30 mL Oral Q4H PRN Coleman, Carolyn H, NP       docusate sodium  (COLACE) capsule 100 mg  100 mg Oral Daily Jullian Previti, MD   100 mg at 08/28/24 9191   fluPHENAZine  (PROLIXIN ) tablet 10 mg  10 mg Oral BID Shrivastava, Aryendra, MD   10 mg at 08/28/24 9191   magnesium  hydroxide (MILK OF MAGNESIA) suspension 30 mL  30 mL Oral Daily PRN Coleman, Carolyn  H, NP       OLANZapine  (ZYPREXA ) injection 5 mg  5 mg Intramuscular TID PRN Mardy Elveria DEL, NP   5 mg at 04/12/24 2230   OLANZapine  zydis (ZYPREXA ) disintegrating tablet 5 mg  5 mg Oral TID PRN Coleman, Carolyn H, NP   5 mg at 07/22/24 0034   polyethylene glycol (MIRALAX  / GLYCOLAX ) packet 17 g  17 g Oral Daily PRN Donnelly Mellow, MD   17 g at 06/04/24 2123   tuberculin injection 5 Units  5 Units Intradermal Once Myanna Ziesmer, MD   5 Units at 08/27/24 1753    Lab Results: No results found for this or any previous visit (from the past 48 hours).  Blood Alcohol level:  Lab Results  Component Value Date   [facility] <15 03/26/2024    Metabolic Disorder Labs: Lab Results  Component Value Date   HGBA1C 5.6 04/02/2024   MPG 114 04/02/2024   No results found for: PROLACTIN Lab Results  Component Value Date   CHOL 146 04/02/2024   TRIG 37 04/02/2024   HDL 43 04/02/2024   CHOLHDL 3.4 04/02/2024   VLDL 7 04/02/2024   LDLCALC 96 04/02/2024    Physical Findings: AIMS:  , ,  ,  ,    CIWA:    COWS:      Psychiatric Specialty Exam:  Presentation  General Appearance:  Appropriate for Environment; Bizarre  Eye Contact: Fair  Speech: Clear and Coherent  Speech Volume: Normal    Mood and Affect  Mood: Anxious  Affect: Congruent   Thought Process  Thought Processes: Disorganized  Orientation:Full (Time, Place and Person)  Thought Content:Illogical; Paranoid Ideation; Delusions  Hallucinations:No data recorded Ideas of Reference:Delusions; Paranoia  Suicidal Thoughts:No data recorded Homicidal Thoughts:No data recorded  Sensorium  Memory: Immediate Fair; Recent Fair; Remote Fair  Judgment: Impaired  Insight: Shallow   Executive Functions  Concentration: Fair  Attention Span: Fair  Recall: [employer] of Knowledge: Fair  Language: Fair   Psychomotor Activity  Psychomotor Activity:No data recorded Musculoskeletal: Strength &  Muscle Tone: decreased Gait & Station: unsteady Assets  Assets: Manufacturing Systems Engineer; Resilience    Physical Exam: Physical Exam Vitals and nursing note reviewed.    ROS Blood pressure 114/76, pulse 73, temperature 98.2 F (36.8 C), resp. rate 18, height 6' (1.829 m), weight 100 kg, SpO2 97%. Body mass index is 29.91 kg/m.  Diagnosis: Principal Problem:  Delusional disorder (HCC) Active Problems:   Laceration of left eyebrow   Major neurocognitive disorder (HCC)    Safety and Monitoring:             -- InVoluntary admission to inpatient psychiatric unit for safety, stabilization and treatment             -- Daily contact with patient to assess and evaluate symptoms and progress in treatment             -- Patient's case to be discussed in multi-disciplinary team meeting             -- Observation Level: q15 minute checks             -- Vital signs:  q12 hours             -- Precautions: suicide, elopement, and assault   2. Psychiatric Diagnoses and Treatment:              Continue Prolixin  to 10 mg twice daily.  -- The risks/benefits/side-effects/alternatives to this medication were discussed in detail with the patient and time was given for questions. The patient consents to medication trial.                -- Metabolic profile and EKG monitoring obtained while on an atypical antipsychotic (BMI: Lipid Panel: HbgA1c: QTc:)              -- Encouraged patient to participate in unit milieu and in scheduled group therapies                            3. Medical Issues Being Addressed:    Fall on/8/25, CT head normal  4. Discharge Planning:   -- Social work and case management to assist with discharge planning and identification of hospital follow-up needs prior to discharge  -- Estimated LOS: 3-4 days Nora Sabey, MD 08/28/2024, 8:16 PM

## 2024-08-28 NOTE — Plan of Care (Signed)
  Problem: Education: Goal: Knowledge of General Education information will improve Description: Including pain rating scale, medication(s)/side effects and non-pharmacologic comfort measures Outcome: Progressing   Problem: Health Behavior/Discharge Planning: Goal: Ability to manage health-related needs will improve Outcome: Progressing   Problem: Clinical Measurements: Goal: Ability to maintain clinical measurements within normal limits will improve Outcome: Progressing Goal: Will remain free from infection Outcome: Progressing Goal: Diagnostic test results will improve Outcome: Progressing Goal: Respiratory complications will improve Outcome: Progressing Goal: Cardiovascular complication will be avoided Outcome: Progressing   Problem: Activity: Goal: Risk for activity intolerance will decrease Outcome: Progressing   Problem: Nutrition: Goal: Adequate nutrition will be maintained Outcome: Progressing   Problem: Coping: Goal: Level of anxiety will decrease Outcome: Progressing   Problem: Elimination: Goal: Will not experience complications related to bowel motility Outcome: Progressing Goal: Will not experience complications related to urinary retention Outcome: Progressing   Problem: Pain Managment: Goal: General experience of comfort will improve and/or be controlled Outcome: Progressing   Problem: Safety: Goal: Ability to remain free from injury will improve Outcome: Progressing   Problem: Skin Integrity: Goal: Risk for impaired skin integrity will decrease Outcome: Progressing   Problem: Education: Goal: Knowledge of [place] [facility] Education information/materials will improve Outcome: Progressing Goal: Emotional status will improve Outcome: Progressing Goal: Mental status will improve Outcome: Progressing Goal: Verbalization of understanding the information provided will improve Outcome: Progressing   Problem: Activity: Goal: Interest or  engagement in activities will improve Outcome: Progressing Goal: Sleeping patterns will improve Outcome: Progressing   Problem: Coping: Goal: Ability to verbalize frustrations and anger appropriately will improve Outcome: Progressing Goal: Ability to demonstrate self-control will improve Outcome: Progressing   Problem: Health Behavior/Discharge Planning: Goal: Identification of resources available to assist in meeting health care needs will improve Outcome: Progressing Goal: Compliance with treatment plan for underlying cause of condition will improve Outcome: Progressing   Problem: Physical Regulation: Goal: Ability to maintain clinical measurements within normal limits will improve Outcome: Progressing   Problem: Safety: Goal: Periods of time without injury will increase Outcome: Progressing

## 2024-08-28 NOTE — Group Note (Signed)
 LCSW Group Therapy Note  Group Date: 08/28/2024 Start Time: 1330 End Time: 1400   Type of Therapy and Topic:  Group Therapy - Healthy vs Unhealthy Coping Skills  Participation Level:  Did Not Attend   Description of Group The focus of this group was to determine what unhealthy coping techniques typically are used by group members and what healthy coping techniques would be helpful in coping with various problems. Patients were guided in becoming aware of the differences between healthy and unhealthy coping techniques. Patients were asked to identify 2-3 healthy coping skills they would like to learn to use more effectively.  Therapeutic Goals Patients learned that coping is what human beings do all day long to deal with various situations in their lives Patients defined and discussed healthy vs unhealthy coping techniques Patients identified their preferred coping techniques and identified whether these were healthy or unhealthy Patients determined 2-3 healthy coping skills they would like to become more familiar with and use more often. Patients provided support and ideas to each other   Summary of Patient Progress:  X   Therapeutic Modalities Cognitive Behavioral Therapy Motivational Interviewing  [name], [place] 08/28/2024  2:26 PM

## 2024-08-28 NOTE — Progress Notes (Signed)
 PPD placed to left forearm 01.05.26    08/28/24 1300  Psych Admission Type (Psych Patients Only)  Admission Status Involuntary  Psychosocial Assessment  Patient Complaints None  Eye Contact Brief  Facial Expression Flat  Affect Appropriate to circumstance  Speech Slow  Interaction Minimal  Motor Activity Slow  Appearance/Hygiene In scrubs  Behavior Characteristics Appropriate to situation  Mood Pleasant  Thought Process  Coherency WDL  Content WDL  Delusions None reported or observed  Perception WDL  Hallucination None reported or observed  Judgment Impaired  Confusion Moderate  Danger to Self  Current suicidal ideation? Denies  Danger to Others  Danger to Others None reported or observed

## 2024-08-28 NOTE — Progress Notes (Signed)
" °   08/27/24 2100  Psych Admission Type (Psych Patients Only)  Admission Status Involuntary  Psychosocial Assessment  Patient Complaints None  Eye Contact Brief  Facial Expression Flat  Affect Appropriate to circumstance  Speech Slow;Soft  Interaction Minimal  Motor Activity Slow  Appearance/Hygiene In scrubs  Behavior Characteristics Appropriate to situation  Mood Pleasant  Thought Process  Coherency WDL  Content WDL  Delusions None reported or observed  Perception WDL  Hallucination None reported or observed  Judgment Impaired  Confusion Moderate  Danger to Self  Current suicidal ideation? Denies  Danger to Others  Danger to Others None reported or observed   Mood/Behavior:  Pleasant and cooperative.  Mild confusion.    Psych assessment: Denies SI/HI and AVH.     Interaction / Group attendance:  In the milieu. Attended group. Minimal interaction with peers and staff.     Medication/ PRNs: Compliant with scheduled medications. No PRNs given.   Pain: Denies   15 min checks in place for safety.   "

## 2024-08-28 NOTE — Progress Notes (Signed)
" °   08/28/24 2100  Psych Admission Type (Psych Patients Only)  Admission Status Involuntary  Psychosocial Assessment  Patient Complaints None  Eye Contact Brief  Facial Expression Flat  Affect Appropriate to circumstance  Speech Slow  Interaction Minimal  Motor Activity Slow  Appearance/Hygiene In scrubs  Behavior Characteristics Appropriate to situation  Mood Pleasant  Thought Process  Coherency WDL  Content WDL  Delusions None reported or observed  Perception WDL  Hallucination None reported or observed  Judgment Impaired  Confusion Moderate  Danger to Self  Current suicidal ideation? Denies  Danger to Others  Danger to Others None reported or observed   Mood/Behavior:  Pleasant and cooperative.  Mild confusion.    Psych assessment: Denies SI/HI and AVH.     Interaction / Group attendance:  In the milieu. Attended group. Minimal interaction with peers and staff.     Medication/ PRNs: Compliant with scheduled medications. No PRNs given.   Pain: Denies   15 min checks in place for safety.   "

## 2024-08-28 NOTE — Group Note (Signed)
 Date:  08/28/2024 Time:  9:08 PM  Group Topic/Focus:  Wrap-Up Group:   The focus of this group is to help patients review their daily goal of treatment and discuss progress on daily workbooks.    Participation Level:  Active  Participation Quality:  Attentive  Affect:  Appropriate  Cognitive:  Appropriate  Insight: Appropriate  Engagement in Group:  Engaged  Modes of Intervention:  Discussion  Additional Comments:    Benjamin [name] Getting 08/28/2024, 9:08 PM

## 2024-08-29 ENCOUNTER — Inpatient Hospital Stay

## 2024-08-29 NOTE — Progress Notes (Signed)
" °   08/29/24 2200  Psych Admission Type (Psych Patients Only)  Admission Status Involuntary  Psychosocial Assessment  Patient Complaints None  Eye Contact Brief  Facial Expression Flat  Affect Appropriate to circumstance  Speech Slow  Interaction Minimal  Motor Activity Slow  Appearance/Hygiene In scrubs  Behavior Characteristics Appropriate to situation  Mood Pleasant  Thought Process  Coherency WDL  Content WDL  Delusions None reported or observed  Perception WDL  Hallucination None reported or observed  Judgment Impaired  Confusion Moderate  Danger to Self  Current suicidal ideation? Denies  Danger to Others  Danger to Others None reported or observed   Mood/Behavior:  Pleasant and cooperative.  Mild confusion.    Psych assessment: Denies SI/HI and AVH.     Interaction / Group attendance:  In the milieu. Attended group. Interacting with peers and staff.     Medication/ PRNs: Compliant with scheduled medications. No PRNs given.   Pain: Denies   15 min checks in place for safety.   "

## 2024-08-29 NOTE — Progress Notes (Signed)
 Patient is expected to discharge Friday 01.09.26 to [facility]. PPD placed on 01.05 was read today 01.07. Chest xray obtained in addition.    08/29/24 1000  Psych Admission Type (Psych Patients Only)  Admission Status Involuntary  Psychosocial Assessment  Patient Complaints None  Eye Contact Brief  Facial Expression Flat  Affect Appropriate to circumstance  Speech Slow  Interaction Minimal  Motor Activity Slow  Appearance/Hygiene In scrubs  Behavior Characteristics Appropriate to situation  Mood Pleasant  Thought Process  Coherency WDL  Content WDL  Delusions None reported or observed  Perception WDL  Hallucination None reported or observed  Judgment Impaired  Confusion Moderate  Danger to Self  Current suicidal ideation? Denies  Danger to Others  Danger to Others None reported or observed

## 2024-08-29 NOTE — Plan of Care (Signed)
" °  Problem: Health Behavior/Discharge Planning: Goal: Identification of resources available to assist in meeting health care needs will improve Outcome: Adequate for Discharge Goal: Compliance with treatment plan for underlying cause of condition will improve Outcome: Adequate for Discharge   "

## 2024-08-29 NOTE — Progress Notes (Signed)
 Patient off site for a chest xray

## 2024-08-29 NOTE — Group Note (Signed)
 Date:  08/29/2024 Time:  5:00 PM  Group Topic/Focus:  Coping With Mental Health Crisis:   The purpose of this group is to help patients identify strategies for coping with mental health crisis.  Group discusses possible causes of crisis and ways to manage them effectively. Healthy Communication:   The focus of this group is to discuss communication, barriers to communication, as well as healthy ways to communicate with others. Making Healthy Choices:   The focus of this group is to help patients identify negative/unhealthy choices they were using prior to admission and identify positive/healthier coping strategies to replace them upon discharge.  A group was facilitated focusing on fun, critical-thinking activities. Patients participated in games such as Family Feud and What Would You Do? scenarios. The group encouraged patients to identify positive aspects of situations while engaging in problem-solving and decision-making skills. Patients were given opportunities to share ideas, collaborate, and practice healthy coping strategies in a supportive environment.  Participation Level:  Did Not Attend  Participation Quality:    Affect:    Cognitive:    Insight:   Engagement in Group:    Modes of Intervention:    Additional Comments:    Radiance Deady L Aster Screws 08/29/2024, 5:00 PM

## 2024-08-29 NOTE — Progress Notes (Signed)
" °   08/29/24 1500  PPD Results  Does patient have an induration at the injection site? No  Induration(mm) 0 mm    "

## 2024-08-29 NOTE — Plan of Care (Signed)
  Problem: Education: Goal: Knowledge of General Education information will improve Description: Including pain rating scale, medication(s)/side effects and non-pharmacologic comfort measures Outcome: Progressing   Problem: Health Behavior/Discharge Planning: Goal: Ability to manage health-related needs will improve Outcome: Progressing   Problem: Clinical Measurements: Goal: Ability to maintain clinical measurements within normal limits will improve Outcome: Progressing Goal: Will remain free from infection Outcome: Progressing Goal: Diagnostic test results will improve Outcome: Progressing Goal: Respiratory complications will improve Outcome: Progressing Goal: Cardiovascular complication will be avoided Outcome: Progressing   Problem: Activity: Goal: Risk for activity intolerance will decrease Outcome: Progressing   Problem: Nutrition: Goal: Adequate nutrition will be maintained Outcome: Progressing   Problem: Coping: Goal: Level of anxiety will decrease Outcome: Progressing   Problem: Elimination: Goal: Will not experience complications related to bowel motility Outcome: Progressing Goal: Will not experience complications related to urinary retention Outcome: Progressing   Problem: Pain Managment: Goal: General experience of comfort will improve and/or be controlled Outcome: Progressing   Problem: Safety: Goal: Ability to remain free from injury will improve Outcome: Progressing   Problem: Skin Integrity: Goal: Risk for impaired skin integrity will decrease Outcome: Progressing   Problem: Education: Goal: Knowledge of [place] [facility] Education information/materials will improve Outcome: Progressing Goal: Emotional status will improve Outcome: Progressing Goal: Mental status will improve Outcome: Progressing Goal: Verbalization of understanding the information provided will improve Outcome: Progressing   Problem: Activity: Goal: Interest or  engagement in activities will improve Outcome: Progressing Goal: Sleeping patterns will improve Outcome: Progressing   Problem: Coping: Goal: Ability to verbalize frustrations and anger appropriately will improve Outcome: Progressing Goal: Ability to demonstrate self-control will improve Outcome: Progressing   Problem: Health Behavior/Discharge Planning: Goal: Identification of resources available to assist in meeting health care needs will improve Outcome: Progressing Goal: Compliance with treatment plan for underlying cause of condition will improve Outcome: Progressing   Problem: Physical Regulation: Goal: Ability to maintain clinical measurements within normal limits will improve Outcome: Progressing   Problem: Safety: Goal: Periods of time without injury will increase Outcome: Progressing

## 2024-08-29 NOTE — Group Note (Signed)
 Date:  08/29/2024 Time:  9:57 AM  Group Topic/Focus:  Coping With Mental Health Crisis:   The purpose of this group is to help patients identify strategies for coping with mental health crisis.  Group discusses possible causes of crisis and ways to manage them effectively.  Two motivational and supportive meditation videos were provided to promote relaxation and rejuvenation of the mind, body, and spirit. Patients were guided to reflect on their experiences from 2025 and encouraged to make intentional, healthy decisions for entering 2026 on a positive and balanced path. Time was allotted for guided breathing exercises, allowing patients to focus on deep inhalation and exhalation while engaging in personal reflection.  Participation Level:  Did Not Attend  Participation Quality:    Affect:    Cognitive:    Insight:   Engagement in Group:    Modes of Intervention:    Additional Comments:    Benjamin [name] Benjamin [name] 08/29/2024, 9:57 AM
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
## 2024-08-29 NOTE — BHH Counselor (Signed)
 Pt was visited by DSS caseworker [name].   Arch discussed pt being transitioned to [facility] in [place] on Friday.   Pt was agreeable.   CSW escorted Lakita off of unit without incident.   Lum Croft, MSW, [place] 08/29/2024 2:15 PM

## 2024-08-29 NOTE — Progress Notes (Signed)
 [name] MD Progress Note  08/29/2024 12:39 PM Benjamin [name]  MRN:  [id number] Benjamin [name] is a 79 year old male who presents to the inpatient geriatric psych unit after jumping from a two story building. Patient was originally seen at [facility] who referred him to the ED who admitted him here on 03/27/2024. Patient reports that people from the [place] gang were trying to kill him and the only escape was through the bedroom window on the second floor. He currently lives in the house with Benjamin [name] and his family and has been living with them for 3-4 years without any problems. On 03/19/2024 he went to the doctor and when he got back home the family wouldn't let him leave. He states that after he realized the gang was going to kill him he barricaded the door with his bed and jumped out the window. When he landed he ran away until one of his neighbors found him and called 911. Patient is admitted to [facility] unit with Q15 min safety monitoring. Multidisciplinary team approach is offered. Medication management; group/milieu therapy is offered.   Subjective:  Chart reviewed, case discussed in multidisciplinary meeting, patient seen during rounds.   Patient is noted to be resting in bed.  Patient was encouraged to go to the day area for lunch.  Patient offers no complaints.  Patient denies SI/HI/plan and denies hallucinations.  Patient reports being stable on his feet and working with physical therapy regularly. Past Psychiatric History: see h&P Family History:  Family History  Problem Relation Age of Onset   Colon cancer Neg Hx    Colon polyps Neg Hx    Stomach cancer Neg Hx    Esophageal cancer Neg Hx    Social History:  Social History   Substance and Sexual Activity  Alcohol Use No     Social History   Substance and Sexual Activity  Drug Use No    Social History   Socioeconomic History   Marital status: Single    Spouse name: Not on file   Number of children: Not on  file   Years of education: Not on file   Highest education level: Not on file  Occupational History   Not on file  Tobacco Use   Smoking status: Former    Types: Cigarettes   Smokeless tobacco: Never  Vaping Use   Vaping status: Never Used  Substance and Sexual Activity   Alcohol use: No   Drug use: No   Sexual activity: Not on file  Other Topics Concern   Not on file  Social History Narrative   Not on file   Social Drivers of Health   Tobacco Use: Medium Risk (03/27/2024)   Patient History    Smoking Tobacco Use: Former    Smokeless Tobacco Use: Never    Passive Exposure: Not on Actuary Strain: Not on file  Food Insecurity: No Food Insecurity (03/27/2024)   Epic    Worried About Programme Researcher, Broadcasting/film/video in the Last Year: Never true    Ran Out of Food in the Last Year: Never true  Transportation Needs: No Transportation Needs (03/27/2024)   Epic    Lack of Transportation (Medical): No    Lack of Transportation (Non-Medical): No  Physical Activity: Not on file  Stress: Not on file  Social Connections: Moderately Integrated (03/27/2024)   Social Connection and Isolation Panel    Frequency of Communication with Friends and Family: Twice a week  Frequency of Social Gatherings with Friends and Family: Once a week    Attends Religious Services: 1 to 4 times per year    Active Member of [employer] or Organizations: No    Attends Banker Meetings: 1 to 4 times per year    Marital Status: Divorced  Depression (PHQ2-9): Not on file  Alcohol Screen: Low Risk (03/27/2024)   Alcohol Screen    Last Alcohol Screening Score (AUDIT): 0  Housing: Low Risk (03/27/2024)   Epic    Unable to Pay for Housing in the Last Year: No    Number of Times Moved in the Last Year: 0    Homeless in the Last Year: No  Utilities: Not At Risk (03/27/2024)   Epic    Threatened with loss of utilities: No  Health Literacy: Not on file   Past Medical History:  Past Medical History:   Diagnosis Date   Allergy    Arthritis    back   Prostate cancer (HCC) dx'd 2010   surg only    Past Surgical History:  Procedure Laterality Date   COLONOSCOPY     EYE SURGERY     PROSTATE SURGERY      Current Medications: Current Facility-Administered Medications  Medication Dose Route Frequency Provider Last Rate Last Admin   acetaminophen  (TYLENOL ) tablet 650 mg  650 mg Oral Q6H PRN Coleman, Carolyn H, NP       alum & mag hydroxide-simeth (MAALOX/MYLANTA) 200-200-20 MG/5ML suspension 30 mL  30 mL Oral Q4H PRN Mardy Elveria DEL, NP       docusate sodium  (COLACE) capsule 100 mg  100 mg Oral Daily Emmanual Gauthreaux, MD   100 mg at 08/29/24 9170   fluPHENAZine  (PROLIXIN ) tablet 10 mg  10 mg Oral BID Shrivastava, Aryendra, MD   10 mg at 08/29/24 9170   magnesium  hydroxide (MILK OF MAGNESIA) suspension 30 mL  30 mL Oral Daily PRN Mardy Elveria DEL, NP       OLANZapine  (ZYPREXA ) injection 5 mg  5 mg Intramuscular TID PRN Mardy Elveria DEL, NP   5 mg at 04/12/24 2230   OLANZapine  zydis (ZYPREXA ) disintegrating tablet 5 mg  5 mg Oral TID PRN Coleman, Carolyn H, NP   5 mg at 07/22/24 0034   polyethylene glycol (MIRALAX  / GLYCOLAX ) packet 17 g  17 g Oral Daily PRN Donnelly Mellow, MD   17 g at 06/04/24 2123   tuberculin injection 5 Units  5 Units Intradermal Once Hasel Janish, MD   5 Units at 08/27/24 1753    Lab Results: No results found for this or any previous visit (from the past 48 hours).  Blood Alcohol level:  Lab Results  Component Value Date   [name] <15 03/26/2024    Metabolic Disorder Labs: Lab Results  Component Value Date   HGBA1C 5.6 04/02/2024   MPG 114 04/02/2024   No results found for: PROLACTIN Lab Results  Component Value Date   CHOL 146 04/02/2024   TRIG 37 04/02/2024   HDL 43 04/02/2024   CHOLHDL 3.4 04/02/2024   VLDL 7 04/02/2024   LDLCALC 96 04/02/2024    Physical Findings: AIMS:  , ,  ,  ,    CIWA:    COWS:      Psychiatric Specialty  Exam:  Presentation  General Appearance:  Appropriate for Environment; Bizarre  Eye Contact: Fair  Speech: Clear and Coherent  Speech Volume: Normal    Mood and Affect  Mood: Anxious  Affect: Congruent   Thought Process  Thought Processes: Disorganized  Orientation:Full (Time, Place and Person)  Thought Content:Illogical; Paranoid Ideation; Delusions  Hallucinations: Denies Ideas of Reference:Delusions; Paranoia Chronic Suicidal Thoughts: Denies Homicidal Thoughts: Denies  Sensorium  Memory: Immediate Fair; Recent Fair; Remote Fair  Judgment: Impaired  Insight: Shallow   Executive Functions  Concentration: Fair  Attention Span: Fair  Recall: [employer] of Knowledge: Fair  Language: Fair   Psychomotor Activity  Psychomotor Activity:No data recorded Musculoskeletal: Strength & Muscle Tone: decreased Gait & Station: unsteady Assets  Assets: Manufacturing Systems Engineer; Resilience    Physical Exam: Physical Exam Vitals and nursing note reviewed.    ROS Blood pressure 127/79, pulse 64, temperature 98.2 F (36.8 C), resp. rate 16, height 6' (1.829 m), weight 100 kg, SpO2 99%. Body mass index is 29.91 kg/m.  Diagnosis: Principal Problem:   Delusional disorder (HCC) Active Problems:   Laceration of left eyebrow   Major neurocognitive disorder (HCC)    Safety and Monitoring:             -- InVoluntary admission to inpatient psychiatric unit for safety, stabilization and treatment             -- Daily contact with patient to assess and evaluate symptoms and progress in treatment             -- Patient's case to be discussed in multi-disciplinary team meeting             -- Observation Level: q15 minute checks             -- Vital signs:  q12 hours             -- Precautions: suicide, elopement, and assault   2. Psychiatric Diagnoses and Treatment:              Continue Prolixin  to 10 mg twice daily.  -- The  risks/benefits/side-effects/alternatives to this medication were discussed in detail with the patient and time was given for questions. The patient consents to medication trial.                -- Metabolic profile and EKG monitoring obtained while on an atypical antipsychotic (BMI: Lipid Panel: HbgA1c: QTc:)              -- Encouraged patient to participate in unit milieu and in scheduled group therapies                            3. Medical Issues Being Addressed:    Fall on/8/25, CT head normal  4. Discharge Planning:   -- Social work and case management to assist with discharge planning and identification of hospital follow-up needs prior to discharge  -- Estimated LOS: 3-4 days Ivania Teagarden, MD 08/29/2024, 12:39 PM

## 2024-08-29 NOTE — Progress Notes (Signed)
 Patient returned from receiving chest xray

## 2024-08-30 DIAGNOSIS — W0110XA Fall on same level from slipping, tripping and stumbling with subsequent striking against unspecified object, initial encounter: Secondary | ICD-10-CM | POA: Diagnosis not present

## 2024-08-30 DIAGNOSIS — Z23 Encounter for immunization: Secondary | ICD-10-CM | POA: Diagnosis present

## 2024-08-30 DIAGNOSIS — Y92231 Patient bathroom in hospital as the place of occurrence of the external cause: Secondary | ICD-10-CM | POA: Diagnosis not present

## 2024-08-30 MED ORDER — FLUPHENAZINE HCL 10 MG PO TABS: 10.0000 mg | ORAL_TABLET | Freq: Two times a day (BID) | ORAL | 0 refills | Status: AC

## 2024-08-30 NOTE — Progress Notes (Signed)
 D: Pt A & O X 3. Denies SI, HI, AVH and pain at this time. D/C home as ordered. Picked up in in front of medical mall by [employer]. A: D/C instructions reviewed with pt including prescriptions and follow up appointment; compliance encouraged. All belongings from assigned locker returned to pt at time of departure. Scheduled medications given with verbal education and effects monitored. Safety checks maintained without incident till time of d/c.  R: Pt receptive to care. Compliant with medications when offered. Denies adverse drug reactions when assessed. Verbalized understanding related to d/c instructions. Signed belonging sheet in agreement with items received from locker. Ambulatory via wheelchair. Appears to be in no physical distress at time of departure.

## 2024-08-30 NOTE — Group Note (Signed)
 Date:  08/30/2024 Time:  10:54 AM  Group Topic/Focus:  Rediscovering Joy:   The focus of this group is to explore various ways to relieve stress in a positive manner.    Participation Level:  Did Not Attend   Benjamin [name] Benjamin [name] 08/30/2024, 10:54 AM

## 2024-08-30 NOTE — Discharge Summary (Signed)
 " Physician Discharge Summary Note  Patient:  Benjamin [name] is an 79 y.o., male MRN:  [id number] DOB:  1946/05/02 Patient phone:  [phone] (home)  Patient address:   [address] [place] [address],   Total time spent: 40 min Date of Admission:  03/27/2024 Date of Discharge: 08/30/2024  Reason for Admission:  Benjamin [name] is a 79 year old male who presents to the inpatient geriatric psych unit after jumping from a two story building. Patient was originally seen at [facility] who referred him to the ED who admitted him here on 03/27/2024. Patient reports that people from the [place] gang were trying to kill him and the only escape was through the bedroom window on the second floor. He currently lives in the house with Benjamin [name] and his family and has been living with them for 3-4 years without any problems. On 03/19/2024 he went to the doctor and when he got back home the family wouldn't let him leave. He states that after he realized the gang was going to kill him he barricaded the door with his bed and jumped out the window. When he landed he ran away until one of his neighbors found him and called 911. Patient is admitted to [facility] unit with Q15 min safety monitoring. Multidisciplinary team approach is offered. Medication management; group/milieu therapy is offered   Principal Problem: Delusional disorder [facility]) Discharge Diagnoses: Principal Problem:   Delusional disorder (HCC) Active Problems:   Laceration of left eyebrow   Major neurocognitive disorder (HCC)   Past Psychiatric History: see h&p  Family Psychiatric  History: see h&p Social History:  Social History   Substance and Sexual Activity  Alcohol Use No     Social History   Substance and Sexual Activity  Drug Use No    Social History   Socioeconomic History   Marital status: Single    Spouse name: Not on file   Number of children: Not on file   Years of education: Not on  file   Highest education level: Not on file  Occupational History   Not on file  Tobacco Use   Smoking status: Former    Types: Cigarettes   Smokeless tobacco: Never  Vaping Use   Vaping status: Never Used  Substance and Sexual Activity   Alcohol use: No   Drug use: No   Sexual activity: Not on file  Other Topics Concern   Not on file  Social History Narrative   Not on file   Social Drivers of Health   Tobacco Use: Medium Risk (03/27/2024)   Patient History    Smoking Tobacco Use: Former    Smokeless Tobacco Use: Never    Passive Exposure: Not on Actuary Strain: Not on file  Food Insecurity: No Food Insecurity (03/27/2024)   Epic    Worried About Programme Researcher, Broadcasting/film/video in the Last Year: Never true    Ran Out of Food in the Last Year: Never true  Transportation Needs: No Transportation Needs (03/27/2024)   Epic    Lack of Transportation (Medical): No    Lack of Transportation (Non-Medical): No  Physical Activity: Not on file  Stress: Not on file  Social Connections: Moderately Integrated (03/27/2024)   Social Connection and Isolation Panel    Frequency of Communication with Friends and Family: Twice a week    Frequency of Social Gatherings with Friends and Family: Once a week    Attends Religious Services: 1 to  4 times per year    Active Member of Clubs or Organizations: No    Attends Banker Meetings: 1 to 4 times per year    Marital Status: Divorced  Depression (EYV7-0): Not on file  Alcohol Screen: Low Risk (03/27/2024)   Alcohol Screen    Last Alcohol Screening Score (AUDIT): 0  Housing: Low Risk (03/27/2024)   Epic    Unable to Pay for Housing in the Last Year: No    Number of Times Moved in the Last Year: 0    Homeless in the Last Year: No  Utilities: Not At Risk (03/27/2024)   Epic    Threatened with loss of utilities: No  Health Literacy: Not on file   Past Medical History:  Past Medical History:  Diagnosis Date   Allergy    Arthritis     back   Prostate cancer (HCC) dx'd 2010   surg only    Past Surgical History:  Procedure Laterality Date   COLONOSCOPY     EYE SURGERY     PROSTATE SURGERY     Family History:  Family History  Problem Relation Age of Onset   Colon cancer Neg Hx    Colon polyps Neg Hx    Stomach cancer Neg Hx    Esophageal cancer Neg Hx     Hospital Course:  Benjamin [name] is a 79 year old male who presents to the inpatient geriatric psych unit after jumping from a two story building. Patient was originally seen at [facility] who referred him to the ED who admitted him here on 03/27/2024. Patient reports that people from the [place] gang were trying to kill him and the only escape was through the bedroom window on the second floor. He currently lives in the house with Benjamin [name] and his family and has been living with them for 3-4 years without any problems. On 03/19/2024 he went to the doctor and when he got back home the family wouldn't let him leave. He states that after he realized the gang was going to kill him he barricaded the door with his bed and jumped out the window. When he landed he ran away until one of his neighbors found him and called 911. Patient is admitted to [facility] unit with Q15 min safety monitoring. Multidisciplinary team approach is offered. Medication management; group/milieu therapy is offered   On admission, patient was started on Prolixin  with dosage titrated up to 10 mg twice daily.  Patient tolerated the medications.  Patient showed significant improvement in his mood and denied depression and anxiety.  His paranoia resolved and was very cooperative with the staff members.  During this admission it was noted that patient displayed significant cognitive impairment.  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  [facility] was called for legal guardianship.  [facility] has taken up guardianship and they worked on finding appropriate  placement.  As patient stayed on the inpatient unit for many months he physically declined that had 2-3 falls with no major head injuries or outcomes.  Patient remained stable.  Patient was eventually discharged with APS for the appropriate placement.  Detailed risk assessment is complete based on clinical exam and individual risk factors and acute suicide risk is low and acute violence risk is low.    On the day of discharge, patient denies SI/HI/plan and denies hallucinations.  Patient remains future oriented and is willing to participate in outpatient mental health services.  Currently, all modifiable risk of harm to self/harm to others have been addressed and patient is no longer appropriate for the acute inpatient setting and is able to continue treatment for mental health needs in the community with the supports as indicated below.  Patient is educated and verbalized understanding of discharge plan of care including medications, follow-up appointments, mental health resources and further crisis services in the community.  He is instructed to call 911 or present to the nearest emergency room should he experience any decompensation in mood, disturbance of bowel or return of suicidal/homicidal ideations.  Patient verbalizes understanding of this education and agrees to this plan of care  Physical Findings: AIMS:  , ,  ,  ,    CIWA:    COWS:      Psychiatric Specialty Exam:  Presentation  General Appearance:  Appropriate for Environment  Eye Contact: Fair  Speech: Clear and Coherent  Speech Volume: Normal    Mood and Affect  Mood: Euthymic  Affect: Appropriate   Thought Process  Thought Processes: Coherent  Descriptions of Associations:Intact  Orientation:Full (Time, Place and Person)  Thought Content:Logical  Hallucinations:Hallucinations: None  Ideas of Reference:None  Suicidal Thoughts:Suicidal Thoughts: No  Homicidal Thoughts:Homicidal Thoughts:  No   Sensorium  Memory: Immediate Poor; Recent Poor  Judgment: Impaired  Insight: Lacking   Executive Functions  Concentration: Poor  Attention Span: Poor  Recall: Poor  Fund of Knowledge: Fair  Language: Fair   Psychomotor Activity  Psychomotor Activity: Psychomotor Activity: Normal  Musculoskeletal: Strength & Muscle Tone: decreased Gait & Station: unsteady Assets  Assets: Communication Skills   Sleep  Sleep: Sleep: Fair    Physical Exam: Physical Exam ROS Blood pressure 136/84, pulse 61, temperature 98.1 F (36.7 C), resp. rate 16, height 6' (1.829 m), weight 100 kg, SpO2 100%. Body mass index is 29.91 kg/m.   Tobacco Use History[1] Tobacco Cessation:  N/A, patient does not currently use tobacco products   Blood Alcohol level:  Lab Results  Component Value Date   [facility] <15 03/26/2024    Metabolic Disorder Labs:  Lab Results  Component Value Date   HGBA1C 5.6 04/02/2024   MPG 114 04/02/2024   No results found for: PROLACTIN Lab Results  Component Value Date   CHOL 146 04/02/2024   TRIG 37 04/02/2024   HDL 43 04/02/2024   CHOLHDL 3.4 04/02/2024   VLDL 7 04/02/2024   LDLCALC 96 04/02/2024    See Psychiatric Specialty Exam and Suicide Risk Assessment completed by Attending Physician prior to discharge.  Discharge destination:  ALF  Is patient on multiple antipsychotic therapies at discharge:  No   Has Patient had three or more failed trials of antipsychotic monotherapy by history:  No  Recommended Plan for Multiple Antipsychotic Therapies: NA   Allergies as of 08/30/2024   No Known Allergies      Medication List     TAKE these medications      Indication  docusate sodium  100 MG capsule Commonly known as: COLACE Take 100 mg by mouth daily.    fluPHENAZine  10 MG tablet Commonly known as: PROLIXIN  Take 1 tablet (10 mg total) by mouth 2 (two) times daily.    omega-3 acid ethyl esters 1 g capsule Commonly known  as: LOVAZA Take 1 g by mouth daily.         Follow-up Information     [facility]. Go on 08/30/2024.   Specialty: Assisted Living Facility Why: You will be picked up  by [facility] staff members from the hospital Contact information: [address] [place]  [address] [phone]                Follow-up recommendations:  Activity:  As tolerated    Signed: Fisher Hargadon, MD 08/30/2024, 11:59 AM          [1]  Social History Tobacco Use  Smoking Status Former   Types: Cigarettes  Smokeless Tobacco Never   "

## 2024-08-30 NOTE — BHH Suicide Risk Assessment (Signed)
 [name] Discharge Suicide Risk Assessment   Principal Problem: Delusional disorder [facility]) Discharge Diagnoses: Principal Problem:   Delusional disorder (HCC) Active Problems:   Laceration of left eyebrow   Major neurocognitive disorder (HCC)   Total Time spent with patient: 30 minutes  Musculoskeletal: Strength & Muscle Tone: within normal limits Gait & Station: normal Patient leans: N/A  Psychiatric Specialty Exam  Presentation  General Appearance:  Appropriate for Environment  Eye Contact: Fair  Speech: Clear and Coherent  Speech Volume: Normal  Handedness: Right   Mood and Affect  Mood: Euthymic  Duration of Depression Symptoms: N/A  Affect: Appropriate   Thought Process  Thought Processes: Coherent  Descriptions of Associations:Intact  Orientation:Full (Time, Place and Person)  Thought Content:Logical  History of Schizophrenia/Schizoaffective disorder:No (Simultaneous filing. User may not have seen previous data.)  Duration of Psychotic Symptoms:N/A  Hallucinations:Hallucinations: None  Ideas of Reference:None  Suicidal Thoughts:Suicidal Thoughts: No  Homicidal Thoughts:Homicidal Thoughts: No   Sensorium  Memory: Immediate Poor; Recent Poor  Judgment: Impaired  Insight: Lacking   Executive Functions  Concentration: Poor  Attention Span: Poor  Recall: Poor  Fund of Knowledge: Fair  Language: Fair   Psychomotor Activity  Psychomotor Activity: Psychomotor Activity: Normal   Assets  Assets: Communication Skills   Sleep  Sleep: Sleep: Fair  Estimated Sleeping Duration (Last 24 Hours): 8.50-9.50 hours  Physical Exam: Physical Exam ROS Blood pressure 136/84, pulse 61, temperature 98.1 F (36.7 C), resp. rate 16, height 6' (1.829 m), weight 100 kg, SpO2 100%. Body mass index is 29.91 kg/m.  Mental Status Per Nursing Assessment::   On Admission:  NA  Demographic Factors:  Male  Loss  Factors: Decrease in vocational status  Historical Factors: Impulsivity  Risk Reduction Factors:   Positive social support, Positive therapeutic relationship, and Positive coping skills or problem solving skills  Continued Clinical Symptoms:  Schizophrenia:   Paranoid or undifferentiated type  Cognitive Features That Contribute To Risk:  None    Suicide Risk:  Minimal: No identifiable suicidal ideation.  Patients presenting with no risk factors but with morbid ruminations; may be classified as minimal risk based on the severity of the depressive symptoms   Follow-up Information     [facility]. Go on 08/30/2024.   Specialty: Assisted Living Facility Why: You will be picked up by [employer] staff members from the hospital Contact information: [address] [place]  [address] [phone]                Plan Of Care/Follow-up recommendations:  Activity:  as tolerated  Kari Montero, MD 08/30/2024, 11:58 AM

## 2024-09-25 ENCOUNTER — Encounter (HOSPITAL_COMMUNITY): Payer: Self-pay

## 2024-09-25 ENCOUNTER — Other Ambulatory Visit: Payer: Self-pay

## 2024-09-25 ENCOUNTER — Emergency Department (HOSPITAL_COMMUNITY)

## 2024-09-25 ENCOUNTER — Emergency Department (HOSPITAL_COMMUNITY)
Admission: EM | Admit: 2024-09-25 | Discharge: 2024-09-25 | Disposition: A | Discharge status: Skilled Nursing Facility | Source: Skilled Nursing Facility | Attending: Emergency Medicine | Admitting: Emergency Medicine

## 2024-09-25 DIAGNOSIS — D72829 Elevated white blood cell count, unspecified: Secondary | ICD-10-CM | POA: Insufficient documentation

## 2024-09-25 DIAGNOSIS — X398XXA Other exposure to forces of nature, initial encounter: Secondary | ICD-10-CM | POA: Insufficient documentation

## 2024-09-25 DIAGNOSIS — M2062 Acquired deformities of toe(s), unspecified, left foot: Secondary | ICD-10-CM | POA: Insufficient documentation

## 2024-09-25 LAB — BASIC METABOLIC PANEL WITH GFR
Anion gap: 15 (ref 5–15)
BUN: 28 mg/dL — ABNORMAL HIGH (ref 8–23)
CO2: 23 mmol/L (ref 22–32)
Calcium: 9.8 mg/dL (ref 8.9–10.3)
Chloride: 100 mmol/L (ref 98–111)
Creatinine, Ser: 1.09 mg/dL (ref 0.61–1.24)
GFR, Estimated: >60 mL/min
Glucose, Bld: 109 mg/dL — ABNORMAL HIGH (ref 70–99)
Potassium: 4.3 mmol/L (ref 3.5–5.1)
Sodium: 138 mmol/L (ref 135–145)

## 2024-09-25 LAB — CBC
HCT: 40.7 % (ref 39.0–52.0)
Hemoglobin: 13.2 g/dL (ref 13.0–17.0)
MCH: 30 pg (ref 26.0–34.0)
MCHC: 32.4 g/dL (ref 30.0–36.0)
MCV: 92.5 fL (ref 80.0–100.0)
Platelets: 276 10*3/uL (ref 150–400)
RBC: 4.4 MIL/uL (ref 4.22–5.81)
RDW: 12.7 % (ref 11.5–15.5)
WBC: 14.8 10*3/uL — ABNORMAL HIGH (ref 4.0–10.5)
nRBC: 0 % (ref 0.0–0.2)

## 2024-09-25 LAB — URINALYSIS, ROUTINE W REFLEX MICROSCOPIC
Bacteria, UA: NONE SEEN
Bilirubin Urine: NEGATIVE
Glucose, UA: NEGATIVE mg/dL
Ketones, ur: 20 mg/dL — AB
Leukocytes,Ua: NEGATIVE
Nitrite: NEGATIVE
Protein, ur: 30 mg/dL — AB
Specific Gravity, Urine: 1.024 (ref 1.005–1.030)
pH: 5 (ref 5.0–8.0)

## 2024-09-25 MED ORDER — LACTATED RINGERS IV BOLUS
1000.0000 mL | Freq: Once | INTRAVENOUS | Status: AC
  Administered 2024-09-25: 1000 mL via INTRAVENOUS

## 2024-09-25 NOTE — ED Provider Notes (Signed)
 "  EMERGENCY DEPARTMENT AT [facility] Provider Note   CSN: [id number] Arrival date & time: 09/25/24  9167     Patient presents with: Cold Exposure and Missing Person   Benjamin [name] is a 79 y.o. male.   HPI Patient presents after cold exposure.  Reportedly was found outside.  Last in nursing facility sometimes yesterday.  Upon arrival found to have temperature of 93.4 here.  Found reportedly bent over with his pants down full face first onto the ground.  He does have some color change on his feet.    Prior to Admission medications  Medication Sig Start Date End Date Taking? Authorizing Provider  docusate sodium  (COLACE) 100 MG capsule Take 100 mg by mouth daily.    [provider]  fluPHENAZine  (PROLIXIN ) 10 MG tablet Take 1 tablet (10 mg total) by mouth 2 (two) times daily. 08/30/24   Jadapalle, Sree, MD  omega-3 acid ethyl esters (LOVAZA) 1 g capsule Take 1 g by mouth daily. 03/20/24   [provider]    Allergies: Patient has no known allergies.    Review of Systems  Updated Vital Signs BP 136/71   Pulse 95   Temp 98.5 F (36.9 C) (Rectal)   Resp 20   Ht 6' 1 (1.854 m)   Wt 113.4 kg   SpO2 100%   BMI 32.98 kg/m   Physical Exam Vitals and nursing note reviewed.  HENT:     Head: Normocephalic and atraumatic.  Cardiovascular:     Rate and Rhythm: Normal rate.  Chest:     Chest wall: No tenderness.  Musculoskeletal:     Comments: Some discoloration of toes on both feet.  More white.  Neurological:     Mental Status: He is alert.     Comments: Mild confusion but otherwise appropriate.     (all labs ordered are listed, but only abnormal results are displayed) Labs Reviewed  BASIC METABOLIC PANEL WITH GFR - Abnormal; Notable for the following components:      Result Value   Glucose, Bld 109 (*)    BUN 28 (*)    All other components within normal limits  CBC - Abnormal; Notable for the following components:   WBC 14.8 (*)     All other components within normal limits    EKG: None  Radiology: CT Cervical Spine Wo Contrast Result Date: 09/25/2024 EXAM: CT CERVICAL SPINE WITHOUT CONTRAST 09/25/2024 10:06:00 AM TECHNIQUE: CT of the cervical spine was performed without the administration of intravenous contrast. Multiplanar reformatted images are provided for review. Automated exposure control, iterative reconstruction, and/or weight based adjustment of the mA/kV was utilized to reduce the radiation dose to as low as reasonably achievable. COMPARISON: CT of the cervical spine dated 03/26/2024. CLINICAL HISTORY: Neck trauma (Age >= 65y). Neck trauma. Age: >= 65 years. FINDINGS: BONES AND ALIGNMENT: The cervical vertebrae maintain height and alignment. There is straightening of the normal cervical lordosis, which may be positional. No acute fracture or traumatic malalignment. DEGENERATIVE CHANGES: There is moderate chronic degenerative disc disease at C5-C6 and C6-C7, as before. There is moderate right-sided facet arthrosis at C4-C5. SOFT TISSUES: No prevertebral soft tissue swelling. IMPRESSION: 1. No acute findings. 2. 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 3. Moderate right-sided facet arthrosis at C4-5. Electronically signed by: Evalene Coho MD 09/25/2024 10:22 AM EST RP Workstation: [id number]   CT HEAD WO CONTRAST ( ) Result Date: 09/25/2024 EXAM: CT HEAD WITHOUT CONTRAST 09/25/2024  10:06:00 AM TECHNIQUE: CT of the head was performed without the administration of intravenous contrast. Automated exposure control, iterative reconstruction, and/or weight based adjustment of the mA/kV was utilized to reduce the radiation dose to as low as reasonably achievable. COMPARISON: 07/01/2024 CLINICAL HISTORY: Minor head trauma (age >= 65 years). FINDINGS: BRAIN AND VENTRICLES: No acute hemorrhage. No evidence of acute infarct. No hydrocephalus. No extra-axial collection. No mass effect or midline  shift. ORBITS: No acute abnormality. SINUSES: Clear paranasal sinuses. SOFT TISSUES AND SKULL: Well-aerated mastoid air cells. No acute soft tissue abnormality. No skull fracture. IMPRESSION: 1. No acute intracranial abnormality. Electronically signed by: Evalene Coho MD 09/25/2024 10:21 AM EST RP Workstation: [id number]     Procedures   Medications Ordered in the ED  lactated ringers  bolus 1,000 mL (0 mLs Intravenous Stopped 09/25/24 1111)                                    Medical Decision Making Amount and/or Complexity of Data Reviewed Labs: ordered. Radiology: ordered.   Patient presents after cold exposure.  Hypothermic.  Blood work overall reassuring.  Patient social worker came and states he is at his baseline.  However apparently they called nursing home and they want a urinalysis before he can return.  Temperature has improved.  Is eating and drinking.  Urinalysis does not show infection.  Appears stable for discharge home.  Negative head CT.  Return back to nursing home.       Final diagnoses:  Exposure to weather condition, initial encounter    ED Discharge Orders     None          Patsey Lot, MD 09/25/24 1455  "

## 2024-09-25 NOTE — ED Notes (Signed)
 Patient transported to CT

## 2024-09-25 NOTE — ED Notes (Signed)
 Bair hugger removed

## 2024-09-25 NOTE — ED Triage Notes (Signed)
 Pt BIB [facility] found as a missing person since 1730 yesterday from [facility] facility. He was found on the patio outside another location about 4 miles from his residence. He was found in a face down position, saturated clothing, alert but confused at baseline. PD reports the facility said he is A/Ox3 with Hx of dementia. EMS reports his fet look frostbit & they were unable to achieve a temp on their thermometer. Pt does say he was going to see a friend [place] & that he did have a few falls. EMS V/c before warming was SBP 186/122 then 113/76, 95 bpm NSR, 12L unremarkable, was unable to get a spO2 plef to read, CBG 96. Rectal temp upon arrival was 93.4, bair hugger applied.
